# Patient Record
Sex: Male | Born: 1960 | Race: White | Hispanic: No | Smoking: Current some day smoker
Health system: Southern US, Community
[De-identification: ages and names within clinical notes are randomized; demographics above are authoritative.]

## PROBLEM LIST (undated history)

## (undated) DIAGNOSIS — I5189 Other ill-defined heart diseases: Secondary | ICD-10-CM

## (undated) DIAGNOSIS — I10 Essential (primary) hypertension: Secondary | ICD-10-CM

## (undated) DIAGNOSIS — E785 Hyperlipidemia, unspecified: Secondary | ICD-10-CM

## (undated) DIAGNOSIS — I214 Non-ST elevation (NSTEMI) myocardial infarction: Secondary | ICD-10-CM

## (undated) DIAGNOSIS — Z720 Tobacco use: Secondary | ICD-10-CM

## (undated) DIAGNOSIS — I2510 Atherosclerotic heart disease of native coronary artery without angina pectoris: Secondary | ICD-10-CM

## (undated) DIAGNOSIS — I200 Unstable angina: Secondary | ICD-10-CM

## (undated) DIAGNOSIS — I219 Acute myocardial infarction, unspecified: Secondary | ICD-10-CM

## (undated) HISTORY — PX: WRIST SURGERY: SHX841

## (undated) HISTORY — DX: Other ill-defined heart diseases: I51.89

## (undated) HISTORY — DX: Atherosclerotic heart disease of native coronary artery without angina pectoris: I25.10

## (undated) HISTORY — PX: CORONARY ANGIOPLASTY WITH STENT PLACEMENT: SHX49

---

## 2004-09-10 ENCOUNTER — Emergency Department: Payer: Self-pay | Admitting: Emergency Medicine

## 2005-03-15 ENCOUNTER — Emergency Department: Payer: Self-pay | Admitting: Emergency Medicine

## 2019-01-16 ENCOUNTER — Encounter: Payer: Self-pay | Admitting: Emergency Medicine

## 2019-01-16 ENCOUNTER — Other Ambulatory Visit: Payer: Self-pay

## 2019-01-16 ENCOUNTER — Emergency Department
Admission: EM | Admit: 2019-01-16 | Discharge: 2019-01-16 | Disposition: A | Discharge status: Home / Self Care | Payer: Self-pay | Attending: Emergency Medicine | Admitting: Emergency Medicine

## 2019-01-16 ENCOUNTER — Emergency Department: Payer: Self-pay

## 2019-01-16 DIAGNOSIS — M65312 Trigger thumb, left thumb: Secondary | ICD-10-CM

## 2019-01-16 DIAGNOSIS — R2232 Localized swelling, mass and lump, left upper limb: Secondary | ICD-10-CM | POA: Insufficient documentation

## 2019-01-16 NOTE — ED Notes (Signed)
Thumb spica splint applied to left thumb.

## 2019-01-16 NOTE — ED Provider Notes (Signed)
[facility] Emergency Department Provider Note  ____________________________________________   First MD Initiated Contact with Patient 01/16/19 1702     (approximate)  I have reviewed the triage vital signs and the nursing notes.   HISTORY  Chief Complaint Wrist Pain    HPI Jeffery Sweeney is a 58 y.o. male presents emergency department complaining of left thumb pain.  Symptoms for approximately 1 month.  Some swelling in the wrist.  He had surgery on the same wrist several years ago.  He states the thumb keeps popping and getting stuck in certain position.  He denies any numbness or tingling.    History reviewed. No pertinent past medical history.  There are no active problems to display for this patient.   History reviewed. No pertinent surgical history.  Prior to Admission medications   Not on File    Allergies Patient has no known allergies.  No family history on file.  Social History Social History   Tobacco Use  . Smoking status: Not on file  Substance Use Topics  . Alcohol use: Not on file  . Drug use: Not on file    Review of Systems  Constitutional: No fever/chills Eyes: No visual changes. ENT: No sore throat. Respiratory: Denies cough Genitourinary: Negative for dysuria. Musculoskeletal: Negative for back pain.  Positive left thumb pain Skin: Negative for rash.    ____________________________________________   PHYSICAL EXAM:  VITAL SIGNS: ED Triage Vitals  Enc Vitals Group     BP 01/16/19 1650 (!) 153/94     Pulse Rate 01/16/19 1650 (!) 113     Resp 01/16/19 1650 20     Temp 01/16/19 1650 98.5 F (36.9 C)     Temp Source 01/16/19 1650 Oral     SpO2 01/16/19 1650 96 %     Weight 01/16/19 1650 182 lb (82.6 kg)     Height 01/16/19 1650 5\' 3"  (1.6 m)     Head Circumference --      Peak Flow --      Pain Score 01/16/19 1656 6     Pain Loc --      Pain Edu? --      Excl. in GC? --     Constitutional:  Alert and oriented. Well appearing and in no acute distress. Eyes: Conjunctivae are normal.  Head: Atraumatic. Nose: No congestion/rhinnorhea. Mouth/Throat: Mucous membranes are moist.   Neck:  supple no lymphadenopathy noted Cardiovascular: Normal rate, regular rhythm.  Respiratory: Normal respiratory effort.  No retractions, GU: deferred Musculoskeletal: FROM all extremities, warm and well perfused, snuffbox of the left wrist along with the carpal bones are tender to palpation.  Neurovascular is intact.  No bruising or swelling is noted. Neurologic:  Normal speech and language.  Skin:  Skin is warm, dry and intact. No rash noted. Psychiatric: Mood and affect are normal. Speech and behavior are normal.  ____________________________________________   LABS (all labs ordered are listed, but only abnormal results are displayed)  Labs Reviewed - No data to display ____________________________________________   ____________________________________________  RADIOLOGY  X-ray of the left wrist is negative for any acute injury  ____________________________________________   PROCEDURES  Procedure(s) performed: Thumb spica splint applied by the nurse   Procedures    ____________________________________________   INITIAL IMPRESSION / ASSESSMENT AND PLAN / ED COURSE  Pertinent labs & imaging results that were available during my care of the patient were reviewed by me and considered in my medical decision making (see chart  for details).   Patient is 58 year old male presents emergency department complaining of left thumb pain and wrist pain.  Physical exam shows the right to be nontender, the left wrist is tender along the carpal bones on the radial aspect.  Medial exam is unremarkable  X-ray of the left wrist is negative for any acute abnormality.  Explained the results to the patient.  Told this could be a tenosynovitis or trigger finger.  He was placed in a thumb spica  splint.  He is [facility] and asked for an appointment with the hand specialist.  He states he understands will comply.  She is to take over-the-counter ibuprofen for pain as needed.  Is discharged stable condition.    Jeffery Sweeney was evaluated in Emergency Department on 01/16/2019 for the symptoms described in the history of present illness. He was evaluated in the context of the global COVID-19 pandemic, which necessitated consideration that the patient might be at risk for infection with the SARS-CoV-2 virus that causes COVID-19. Institutional protocols and algorithms that pertain to the evaluation of patients at risk for COVID-19 are in a state of rapid change based on information released by regulatory bodies including the CDC and federal and state organizations. These policies and algorithms were followed during the patient's care in the ED.   As part of my medical decision making, I reviewed the following data within the [place] notes reviewed and incorporated, Old chart reviewed, Radiograph reviewed x-ray left wrist negative, Notes from prior ED visits and [place] Controlled Substance Database  ____________________________________________   FINAL CLINICAL IMPRESSION(S) / ED DIAGNOSES  Final diagnoses:  Trigger thumb of left hand      NEW MEDICATIONS STARTED DURING THIS VISIT:  There are no discharge medications for this patient.    Note:  This document was prepared using Dragon voice recognition software and may include unintentional dictation errors.    Versie Starks, PA-C 01/16/19 [name], MD 01/16/19 [phone]

## 2019-01-16 NOTE — Discharge Instructions (Addendum)
[facility]up with [facility].  Please call for appointment.  Take over-the-counter Tylenol or Advil for inflammation.  Return emergency department for worsening.

## 2019-01-16 NOTE — ED Triage Notes (Signed)
Pt to ED via POV c/o wrist pain and swelling. Pt states that he injured his thumb about a month ago and about 2 weeks after he started having pain and swelling in his wrist. Pt states that he had surgery on same wrist several years ago. Pt is in NAD

## 2019-01-16 NOTE — ED Notes (Signed)
Pt verbalized understanding of discharge instructions. NAD at this time. 

## 2019-03-02 ENCOUNTER — Other Ambulatory Visit: Payer: Self-pay

## 2019-03-02 ENCOUNTER — Emergency Department
Admission: EM | Admit: 2019-03-02 | Discharge: 2019-03-02 | Disposition: A | Discharge status: Home / Self Care | Payer: Self-pay | Attending: Emergency Medicine | Admitting: Emergency Medicine

## 2019-03-02 ENCOUNTER — Encounter: Payer: Self-pay | Admitting: *Deleted

## 2019-03-02 ENCOUNTER — Emergency Department: Payer: Self-pay

## 2019-03-02 DIAGNOSIS — R112 Nausea with vomiting, unspecified: Secondary | ICD-10-CM | POA: Insufficient documentation

## 2019-03-02 DIAGNOSIS — F17210 Nicotine dependence, cigarettes, uncomplicated: Secondary | ICD-10-CM | POA: Insufficient documentation

## 2019-03-02 DIAGNOSIS — R1084 Generalized abdominal pain: Secondary | ICD-10-CM | POA: Insufficient documentation

## 2019-03-02 LAB — CBC
HCT: 44.6 % (ref 39.0–52.0)
Hemoglobin: 15.2 g/dL (ref 13.0–17.0)
MCH: 29.9 pg (ref 26.0–34.0)
MCHC: 34.1 g/dL (ref 30.0–36.0)
MCV: 87.6 fL (ref 80.0–100.0)
Platelets: 285 10*3/uL (ref 150–400)
RBC: 5.09 MIL/uL (ref 4.22–5.81)
RDW: 12.9 % (ref 11.5–15.5)
WBC: 7.1 10*3/uL (ref 4.0–10.5)
nRBC: 0 % (ref 0.0–0.2)

## 2019-03-02 LAB — URINALYSIS, COMPLETE (UACMP) WITH MICROSCOPIC
Bacteria, UA: NONE SEEN
Bilirubin Urine: NEGATIVE
Glucose, UA: NEGATIVE mg/dL
Hgb urine dipstick: NEGATIVE
Ketones, ur: NEGATIVE mg/dL
Leukocytes,Ua: NEGATIVE
Nitrite: NEGATIVE
Protein, ur: NEGATIVE mg/dL
Specific Gravity, Urine: 1.016 (ref 1.005–1.030)
pH: 5 (ref 5.0–8.0)

## 2019-03-02 LAB — TROPONIN I (HIGH SENSITIVITY): Troponin I (High Sensitivity): 3 ng/L (ref <18)

## 2019-03-02 LAB — COMPREHENSIVE METABOLIC PANEL
ALT: 19 U/L (ref 0–44)
AST: 18 U/L (ref 15–41)
Albumin: 3.7 g/dL (ref 3.5–5.0)
Alkaline Phosphatase: 77 U/L (ref 38–126)
Anion gap: 6 (ref 5–15)
BUN: 15 mg/dL (ref 6–20)
CO2: 23 mmol/L (ref 22–32)
Calcium: 9 mg/dL (ref 8.9–10.3)
Chloride: 106 mmol/L (ref 98–111)
Creatinine, Ser: 0.91 mg/dL (ref 0.61–1.24)
GFR calc Af Amer: >60 mL/min (ref >60)
GFR calc non Af Amer: >60 mL/min (ref >60)
Glucose, Bld: 111 mg/dL — ABNORMAL HIGH (ref 70–99)
Potassium: 3.7 mmol/L (ref 3.5–5.1)
Sodium: 135 mmol/L (ref 135–145)
Total Bilirubin: 0.8 mg/dL (ref 0.3–1.2)
Total Protein: 7.1 g/dL (ref 6.5–8.1)

## 2019-03-02 LAB — LIPASE, BLOOD: Lipase: 26 U/L (ref 11–51)

## 2019-03-02 MED ORDER — ONDANSETRON HCL 4 MG/2ML IJ SOLN
4.0000 mg | Freq: Once | INTRAMUSCULAR | Status: AC
  Administered 2019-03-02: 4 mg via INTRAVENOUS
  Filled 2019-03-02: qty 2

## 2019-03-02 MED ORDER — ONDANSETRON HCL 4 MG/2ML IJ SOLN
4.0000 mg | Freq: Once | INTRAMUSCULAR | Status: AC
  Administered 2019-03-02: 21:00:00 4 mg via INTRAVENOUS
  Filled 2019-03-02: qty 2

## 2019-03-02 MED ORDER — IOHEXOL 9 MG/ML PO SOLN
500.0000 mL | Freq: Once | ORAL | Status: DC | PRN
  Administered 2019-03-02: 500 mL via ORAL
  Filled 2019-03-02: qty 500

## 2019-03-02 MED ORDER — ONDANSETRON 4 MG PO TBDP: 4.0000 mg | ORAL_TABLET | Freq: Three times a day (TID) | ORAL | 0 refills | Status: DC | PRN

## 2019-03-02 MED ORDER — SODIUM CHLORIDE 0.9 % IV BOLUS
1000.0000 mL | Freq: Once | INTRAVENOUS | Status: AC
  Administered 2019-03-02: 21:00:00 1000 mL via INTRAVENOUS

## 2019-03-02 MED ORDER — SODIUM CHLORIDE 0.9% FLUSH: 3.0000 mL | Freq: Once | INTRAVENOUS | Status: DC

## 2019-03-02 MED ORDER — IOHEXOL 300 MG/ML  SOLN
100.0000 mL | Freq: Once | INTRAMUSCULAR | Status: AC | PRN
  Administered 2019-03-02: 100 mL via INTRAVENOUS

## 2019-03-02 NOTE — ED Notes (Signed)
Pt notified this nurse that he finished his contrast, CT notified.

## 2019-03-02 NOTE — ED Notes (Signed)
Pt returned to room 02 from CT.

## 2019-03-02 NOTE — ED Notes (Signed)
Patient transported to CT 

## 2019-03-02 NOTE — ED Notes (Signed)
Pt states that he was woken up out of his sleep around 0130 this morning with nausea and vomiting. States that he ate a [employer] salad last night. States that he attempted to eat crackers but found no relief.

## 2019-03-02 NOTE — Discharge Instructions (Addendum)
Use the Zofran melt on your tongue wafers 1 wafer 3 times a day to help with the nausea.  Please return for fever continued vomiting or continued abdominal pain.  Please follow-up with your regular doctor in the next couple days.

## 2019-03-02 NOTE — ED Notes (Signed)
Pt given warm blankets.

## 2019-03-02 NOTE — ED Provider Notes (Signed)
[facility] Emergency Department Provider Note   ____________________________________________   First MD Initiated Contact with Patient 03/02/19 2033     (approximate)  I have reviewed the triage vital signs and the nursing notes.   HISTORY  Chief Complaint Abdominal Pain  HPI QUY LOTTS is a 58 y.o. male who reports abdominal pain with vomiting starting early this morning.  He said it started after he ate at [facility] salad last night.  He was tried to eat some crackers but that did not help.  He has not had any diarrhea.  Has diffusely tender abdomen.  He says it hurts.  Not particularly crampy.  Nothing seems to make it better or worse.         History reviewed. No pertinent past medical history. Past history significant for hypertension and coronary artery disease with 3 stents. There are no active problems to display for this patient.   No past surgical history on file.  Prior to Admission medications   Not on File    Allergies Patient has no known allergies.  No family history on file.  Social History Social History   Tobacco Use   Smoking status: Current Every Day Smoker   Smokeless tobacco: Never Used  Substance Use Topics   Alcohol use: Not Currently   Drug use: Not Currently    Review of Systems  Constitutional: No fever/chills Eyes: No visual changes. ENT: No sore throat. Cardiovascular: Denies chest pain. Respiratory: Denies shortness of breath. Gastrointestinal:  abdominal pain.   nausea,  vomiting.  No diarrhea.  No constipation. Genitourinary: Negative for dysuria. Musculoskeletal: Negative for back pain. Skin: Negative for rash. Neurological: Negative for headaches, focal weakness   ____________________________________________   PHYSICAL EXAM:  VITAL SIGNS: ED Triage Vitals  Enc Vitals Group     BP 03/02/19 1907 (!) 140/96     Pulse Rate 03/02/19 1907 100     Resp --      Temp 03/02/19 1907  98.5 F (36.9 C)     Temp src --      SpO2 03/02/19 1907 97 %     Weight 03/02/19 1907 180 lb (81.6 kg)     Height 03/02/19 1907 5' 3.5" (1.613 m)     Head Circumference --      Peak Flow --      Pain Score 03/02/19 1910 9     Pain Loc --      Pain Edu? --      Excl. in GC? --     Constitutional: Alert and oriented. Well appearing and in no acute distress. Eyes: Conjunctivae are normal. PERRL. EOMI. Head: Atraumatic. Nose: No congestion/rhinnorhea. Mouth/Throat: Mucous membranes are moist.  Oropharynx non-erythematous. Neck: No stridor.  Cardiovascular: Normal rate, regular rhythm. Grossly normal heart sounds.  Good peripheral circulation. Respiratory: Normal respiratory effort.  No retractions. Lungs CTAB. Gastrointestinal: Soft mildly diffusely tender.  Abdomen is quiet, slight distention. No abdominal bruits.  Musculoskeletal: No lower extremity tenderness nor edema.   Neurologic:  Normal speech and language. No gross focal neurologic deficits are appreciated. Skin:  Skin is warm, dry and intact. No rash noted.  ____________________________________________   LABS (all labs ordered are listed, but only abnormal results are displayed)  Labs Reviewed  COMPREHENSIVE METABOLIC PANEL - Abnormal; Notable for the following components:      Result Value   Glucose, Bld 111 (*)    All other components within normal limits  URINALYSIS, COMPLETE (UACMP) WITH  MICROSCOPIC - Abnormal; Notable for the following components:   Color, Urine YELLOW (*)    APPearance CLEAR (*)    All other components within normal limits  LIPASE, BLOOD  CBC  TROPONIN I (HIGH SENSITIVITY)  TROPONIN I (HIGH SENSITIVITY)   ____________________________________________  EKG  EKG read interpreted by me shows sinus tachycardia rate of 103 normal axis increased R wave progression no acute ST-T changes ____________________________________________  RADIOLOGY  ED MD interpretation: CT read by radiology  reviewed by me shows no acute pathology. Chest x-ray read by radiology reviewed by me also shows no acute pathology Official radiology report(s): Ct Abdomen Pelvis W Contrast  Result Date: 03/02/2019 CLINICAL DATA:  58 year old male with abdominal pain. EXAM: CT ABDOMEN AND PELVIS WITH CONTRAST TECHNIQUE: Multidetector CT imaging of the abdomen and pelvis was performed using the standard protocol following bolus administration of intravenous contrast. CONTRAST:  13mL OMNIPAQUE IOHEXOL 300 MG/ML  SOLN COMPARISON:  None. FINDINGS: Lower chest: The visualized lung bases are clear. No intra-abdominal free air or free fluid. Hepatobiliary: Apparent fatty infiltration of the liver. No intrahepatic biliary ductal dilatation. The gallbladder is predominantly contracted. No calcified gallstone or pericholecystic fluid Pancreas: Unremarkable. No pancreatic ductal dilatation or surrounding inflammatory changes. Spleen: Normal in size without focal abnormality. Adrenals/Urinary Tract: The adrenal glands are unremarkable. There is no hydronephrosis on either side. There is symmetric enhancement and excretion of contrast by both kidneys. There is a 3.5 cm left renal interpolar cyst. The visualized ureters and urinary bladder appear unremarkable. Stomach/Bowel: There is sigmoid diverticulosis and scattered colonic diverticula without active inflammatory changes. There is no bowel obstruction or active inflammation. The appendix is normal. Vascular/Lymphatic: Moderate aortoiliac atherosclerotic disease. The IVC is unremarkable. No portal venous gas. There is no adenopathy. Reproductive: The prostate and seminal vesicles are grossly unremarkable. No pelvic mass. Other: None Musculoskeletal: Degenerative changes of the spine. No acute osseous pathology. IMPRESSION: 1. No acute intra-abdominal or pelvic pathology. 2. Colonic diverticulosis. No bowel obstruction or active inflammation. Normal appendix. Aortic Atherosclerosis  (ICD10-I70.0). Electronically Signed   By: Anner Crete M.D.   On: 03/02/2019 21:38   Dg Chest Portable 1 View  Result Date: 03/02/2019 CLINICAL DATA:  Diffuse abdominal pain.  Nausea vomiting. EXAM: PORTABLE CHEST 1 VIEW COMPARISON:  None. FINDINGS: The heart size is normal. Lung volumes are low. There is no edema or effusion. No focal airspace disease is present. The visualized soft tissues and bony thorax are unremarkable. IMPRESSION: Negative one-view chest x-ray Electronically Signed   By: San Morelle M.D.   On: 03/02/2019 21:17    ____________________________________________   PROCEDURES  Procedure(s) performed (including Critical Care):  Procedures   ____________________________________________   INITIAL IMPRESSION / ASSESSMENT AND PLAN / ED COURSE  Patient lab work looks normal chest x-ray and CT look okay.  Patient had had a lot of vomiting.  It is possible his abdominal pain is only from the vomiting.  I will give him some Zofran and some pain medicine and let him go.             ____________________________________________   FINAL CLINICAL IMPRESSION(S) / ED DIAGNOSES  Final diagnoses:  Generalized abdominal pain  Non-intractable vomiting with nausea, unspecified vomiting type     ED Discharge Orders    None       Note:  This document was prepared using Dragon voice recognition software and may include unintentional dictation errors.    Nena Polio, MD 03/02/19 2317

## 2019-03-02 NOTE — ED Triage Notes (Signed)
Pt reports abd pain with vomiting today.  No diarrhea. Vomiting x 6.   No chest pain.  Pt alert.  Speech clear.

## 2019-04-18 ENCOUNTER — Encounter: Payer: Self-pay | Admitting: Emergency Medicine

## 2019-04-18 ENCOUNTER — Other Ambulatory Visit: Payer: Self-pay

## 2019-04-18 ENCOUNTER — Emergency Department
Admission: EM | Admit: 2019-04-18 | Discharge: 2019-04-18 | Disposition: A | Discharge status: Home / Self Care | Payer: Self-pay | Attending: Student in an Organized Health Care Education/Training Program | Admitting: Student in an Organized Health Care Education/Training Program

## 2019-04-18 DIAGNOSIS — R197 Diarrhea, unspecified: Secondary | ICD-10-CM | POA: Insufficient documentation

## 2019-04-18 DIAGNOSIS — Z20828 Contact with and (suspected) exposure to other viral communicable diseases: Secondary | ICD-10-CM | POA: Insufficient documentation

## 2019-04-18 DIAGNOSIS — Z955 Presence of coronary angioplasty implant and graft: Secondary | ICD-10-CM | POA: Insufficient documentation

## 2019-04-18 DIAGNOSIS — I252 Old myocardial infarction: Secondary | ICD-10-CM | POA: Insufficient documentation

## 2019-04-18 DIAGNOSIS — F17210 Nicotine dependence, cigarettes, uncomplicated: Secondary | ICD-10-CM | POA: Insufficient documentation

## 2019-04-18 DIAGNOSIS — I10 Essential (primary) hypertension: Secondary | ICD-10-CM | POA: Insufficient documentation

## 2019-04-18 HISTORY — DX: Acute myocardial infarction, unspecified: I21.9

## 2019-04-18 HISTORY — DX: Essential (primary) hypertension: I10

## 2019-04-18 LAB — COMPREHENSIVE METABOLIC PANEL
ALT: 25 U/L (ref 0–44)
AST: 23 U/L (ref 15–41)
Albumin: 4.1 g/dL (ref 3.5–5.0)
Alkaline Phosphatase: 81 U/L (ref 38–126)
Anion gap: 10 (ref 5–15)
BUN: 18 mg/dL (ref 6–20)
CO2: 23 mmol/L (ref 22–32)
Calcium: 9 mg/dL (ref 8.9–10.3)
Chloride: 104 mmol/L (ref 98–111)
Creatinine, Ser: 0.91 mg/dL (ref 0.61–1.24)
GFR calc Af Amer: >60 mL/min (ref >60)
GFR calc non Af Amer: >60 mL/min (ref >60)
Glucose, Bld: 121 mg/dL — ABNORMAL HIGH (ref 70–99)
Potassium: 4.1 mmol/L (ref 3.5–5.1)
Sodium: 137 mmol/L (ref 135–145)
Total Bilirubin: 1.1 mg/dL (ref 0.3–1.2)
Total Protein: 7.8 g/dL (ref 6.5–8.1)

## 2019-04-18 LAB — CBC
HCT: 46.8 % (ref 39.0–52.0)
Hemoglobin: 15.7 g/dL (ref 13.0–17.0)
MCH: 29.3 pg (ref 26.0–34.0)
MCHC: 33.5 g/dL (ref 30.0–36.0)
MCV: 87.5 fL (ref 80.0–100.0)
Platelets: 296 10*3/uL (ref 150–400)
RBC: 5.35 MIL/uL (ref 4.22–5.81)
RDW: 12.7 % (ref 11.5–15.5)
WBC: 9.7 10*3/uL (ref 4.0–10.5)
nRBC: 0 % (ref 0.0–0.2)

## 2019-04-18 LAB — LIPASE, BLOOD: Lipase: 24 U/L (ref 11–51)

## 2019-04-18 MED ORDER — ONDANSETRON HCL 4 MG PO TABS: 4.0000 mg | ORAL_TABLET | Freq: Every day | ORAL | 0 refills | Status: DC | PRN

## 2019-04-18 MED ORDER — ONDANSETRON 4 MG PO TBDP
4.0000 mg | ORAL_TABLET | Freq: Once | ORAL | Status: AC
  Administered 2019-04-18: 4 mg via ORAL
  Filled 2019-04-18: qty 1

## 2019-04-18 MED ORDER — SODIUM CHLORIDE 0.9% FLUSH: 3.0000 mL | Freq: Once | INTRAVENOUS | Status: DC

## 2019-04-18 MED ORDER — SODIUM CHLORIDE 0.9 % IV BOLUS
1000.0000 mL | Freq: Once | INTRAVENOUS | Status: AC
  Administered 2019-04-18: 15:00:00 1000 mL via INTRAVENOUS

## 2019-04-18 NOTE — ED Triage Notes (Signed)
Says he has diarrhea since Saturday--had some vomiting and headache, but those are better.  Says the diarrhea continues.  He is able to drink and keep liquids downb.

## 2019-04-18 NOTE — ED Provider Notes (Signed)
[facility] Emergency Department Provider Note    First MD Initiated Contact with Patient 04/18/19 1457     (approximate)  I have reviewed the triage vital signs and the nursing notes.   HISTORY  Chief Complaint Diarrhea    HPI Jeffery Sweeney is a 58 y.o. male who presents to the ER for evaluation of several days of watery diarrhea.  States that he feels like he got sick after eating a [employer] hamburger.  No blood.  No melena.  No recent antibiotics.  Still have some intermittent crampy abdominal pain.  Feels nauseated but not having any vomiting.  Denies any chest pain or shortness of breath.  No known sick contacts but does work at [employer].    Past Medical History:  Diagnosis Date  . Hypertension   . Myocardial infarct [facility])    No family history on file. Past Surgical History:  Procedure Laterality Date  . CORONARY ANGIOPLASTY WITH STENT PLACEMENT    . WRIST SURGERY Left    for cyst   There are no active problems to display for this patient.     Prior to Admission medications   Medication Sig Start Date End Date Taking? Authorizing Provider  ondansetron (ZOFRAN ODT) 4 MG disintegrating tablet Take 1 tablet (4 mg total) by mouth every 8 (eight) hours as needed for nausea or vomiting. 03/02/19   Arnaldo Natal, MD  ondansetron (ZOFRAN) 4 MG tablet Take 1 tablet (4 mg total) by mouth daily as needed. 04/18/19 04/17/20  Willy Eddy, MD    Allergies Patient has no known allergies.    Social History Social History   Tobacco Use  . Smoking status: Current Every Day Smoker  . Smokeless tobacco: Never Used  Substance Use Topics  . Alcohol use: Not Currently  . Drug use: Not Currently    Review of Systems Patient denies headaches, rhinorrhea, blurry vision, numbness, shortness of breath, chest pain, edema, cough, abdominal pain, nausea, vomiting, diarrhea, dysuria, fevers, rashes or hallucinations unless otherwise stated above in  HPI. ____________________________________________   PHYSICAL EXAM:  VITAL SIGNS: Vitals:   04/18/19 1124 04/18/19 1507  BP: (!) 136/99 129/86  Pulse: (!) 112 84  Resp: 16 18  Temp: 98 F (36.7 C)   SpO2: 98% 99%    Constitutional: Alert and oriented.  Eyes: Conjunctivae are normal.  Head: Atraumatic. Nose: No congestion/rhinnorhea. Mouth/Throat: Mucous membranes are moist.   Neck: No stridor. Painless ROM.  Cardiovascular: Normal rate, regular rhythm. Grossly normal heart sounds.  Good peripheral circulation. Respiratory: Normal respiratory effort.  No retractions. Lungs CTAB. Gastrointestinal: Soft and nontender. No distention. No abdominal bruits. No CVA tenderness. Genitourinary:  Musculoskeletal: No lower extremity tenderness nor edema.  No joint effusions. Neurologic:  Normal speech and language. No gross focal neurologic deficits are appreciated. No facial droop Skin:  Skin is warm, dry and intact. No rash noted. Psychiatric: Mood and affect are normal. Speech and behavior are normal.  ____________________________________________   LABS (all labs ordered are listed, but only abnormal results are displayed)  Results for orders placed or performed during the hospital encounter of 04/18/19 (from the past 24 hour(s))  Lipase, blood     Status: None   Collection Time: 04/18/19 11:31 AM  Result Value Ref Range   Lipase 24 11 - 51 U/L  Comprehensive metabolic panel     Status: Abnormal   Collection Time: 04/18/19 11:31 AM  Result Value Ref Range   Sodium 137 135 -  145 mmol/L   Potassium 4.1 3.5 - 5.1 mmol/L   Chloride 104 98 - 111 mmol/L   CO2 23 22 - 32 mmol/L   Glucose, Bld 121 (H) 70 - 99 mg/dL   BUN 18 6 - 20 mg/dL   Creatinine, Ser 0.91 0.61 - 1.24 mg/dL   Calcium 9.0 8.9 - 10.3 mg/dL   Total Protein 7.8 6.5 - 8.1 g/dL   Albumin 4.1 3.5 - 5.0 g/dL   AST 23 15 - 41 U/L   ALT 25 0 - 44 U/L   Alkaline Phosphatase 81 38 - 126 U/L   Total Bilirubin 1.1 0.3 -  1.2 mg/dL   GFR calc non Af Amer >60 >60 mL/min   GFR calc Af Amer >60 >60 mL/min   Anion gap 10 5 - 15  CBC     Status: None   Collection Time: 04/18/19 11:31 AM  Result Value Ref Range   WBC 9.7 4.0 - 10.5 K/uL   RBC 5.35 4.22 - 5.81 MIL/uL   Hemoglobin 15.7 13.0 - 17.0 g/dL   HCT 46.8 39.0 - 52.0 %   MCV 87.5 80.0 - 100.0 fL   MCH 29.3 26.0 - 34.0 pg   MCHC 33.5 30.0 - 36.0 g/dL   RDW 12.7 11.5 - 15.5 %   Platelets 296 150 - 400 K/uL   nRBC 0.0 0.0 - 0.2 %   ____________________________________________ ____________________________________________  RADIOLOGY   ____________________________________________   PROCEDURES  Procedure(s) performed:  Procedures    Critical Care performed: no ____________________________________________   INITIAL IMPRESSION / ASSESSMENT AND PLAN / ED COURSE  Pertinent labs & imaging results that were available during my care of the patient were reviewed by me and considered in my medical decision making (see chart for details).   DDX: Enteritis, colitis, C. difficile, Covid, dehydration, electrolyte abnormality  Jeffery Sweeney is a 58 y.o. who presents to the ED with symptoms as described above.  Patient nontoxic-appearing in no acute distress.  Appears well perfused.  Mild tachycardia upon arrival.  Will give IV fluids check blood work and reassess.  His abdominal exam is soft benign.  Clinical Course as of Apr 18 1639  Mon Apr 18, 2019  1635 Patient able tolerate p.o.  Unable to provide stool sample as he does not feel like he needs to.  Have a lower suspicion for C. difficile.  At this point he believe he stable and appropriate for outpatient follow-up.  Discussed signs and symptoms for which he should return to the ER.   [PR]    Clinical Course User Index [PR] Merlyn Lot, MD    The patient was evaluated in Emergency Department today for the symptoms described in the history of present illness. He/she was evaluated in the  context of the global COVID-19 pandemic, which necessitated consideration that the patient might be at risk for infection with the SARS-CoV-2 virus that causes COVID-19. Institutional protocols and algorithms that pertain to the evaluation of patients at risk for COVID-19 are in a state of rapid change based on information released by regulatory bodies including the CDC and federal and state organizations. These policies and algorithms were followed during the patient's care in the ED.  As part of my medical decision making, I reviewed the following data within the [place] notes reviewed and incorporated, Labs reviewed, notes from prior ED visits and [place] Controlled Substance Database   ____________________________________________   FINAL CLINICAL IMPRESSION(S) / ED DIAGNOSES  Final diagnoses:  Diarrhea, unspecified type      NEW MEDICATIONS STARTED DURING THIS VISIT:  New Prescriptions   ONDANSETRON (ZOFRAN) 4 MG TABLET    Take 1 tablet (4 mg total) by mouth daily as needed.     Note:  This document was prepared using Dragon voice recognition software and may include unintentional dictation errors.    Willy Eddyobinson, [place], MD 04/18/19 1640

## 2019-04-18 NOTE — ED Notes (Signed)
Pt given ginger ale and graham crackers.

## 2019-04-19 LAB — SARS CORONAVIRUS 2 (TAT 6-24 HRS): SARS Coronavirus 2: NEGATIVE

## 2019-08-25 ENCOUNTER — Other Ambulatory Visit: Payer: Self-pay

## 2019-08-25 DIAGNOSIS — G8929 Other chronic pain: Secondary | ICD-10-CM | POA: Insufficient documentation

## 2019-08-25 DIAGNOSIS — M5442 Lumbago with sciatica, left side: Secondary | ICD-10-CM | POA: Insufficient documentation

## 2019-08-25 DIAGNOSIS — M545 Low back pain: Secondary | ICD-10-CM | POA: Insufficient documentation

## 2019-08-25 DIAGNOSIS — I252 Old myocardial infarction: Secondary | ICD-10-CM | POA: Insufficient documentation

## 2019-08-25 DIAGNOSIS — M79662 Pain in left lower leg: Secondary | ICD-10-CM | POA: Insufficient documentation

## 2019-08-25 DIAGNOSIS — F17200 Nicotine dependence, unspecified, uncomplicated: Secondary | ICD-10-CM | POA: Insufficient documentation

## 2019-08-25 DIAGNOSIS — I10 Essential (primary) hypertension: Secondary | ICD-10-CM | POA: Insufficient documentation

## 2019-08-25 DIAGNOSIS — Z955 Presence of coronary angioplasty implant and graft: Secondary | ICD-10-CM | POA: Insufficient documentation

## 2019-08-25 NOTE — ED Triage Notes (Addendum)
Pt in with co left lower back pain radiates down left hip and down to left lower leg. Pt states worse to walk or move. Pt has hx of back problems but unsure of diagnosis. Pt states also pain to left calf that is severe at times.

## 2019-08-26 ENCOUNTER — Emergency Department
Admission: EM | Admit: 2019-08-26 | Discharge: 2019-08-26 | Disposition: A | Discharge status: Home / Self Care | Payer: Self-pay | Attending: Emergency Medicine | Admitting: Emergency Medicine

## 2019-08-26 ENCOUNTER — Encounter: Payer: Self-pay | Admitting: Emergency Medicine

## 2019-08-26 ENCOUNTER — Emergency Department: Payer: Self-pay

## 2019-08-26 DIAGNOSIS — M5442 Lumbago with sciatica, left side: Secondary | ICD-10-CM

## 2019-08-26 DIAGNOSIS — G8929 Other chronic pain: Secondary | ICD-10-CM

## 2019-08-26 MED ORDER — TRAMADOL HCL 50 MG PO TABS: 50.0000 mg | ORAL_TABLET | Freq: Four times a day (QID) | ORAL | 0 refills | Status: DC | PRN

## 2019-08-26 MED ORDER — PREDNISONE 10 MG PO TABS: ORAL_TABLET | ORAL | 0 refills | Status: DC

## 2019-08-26 MED ORDER — KETOROLAC TROMETHAMINE 30 MG/ML IJ SOLN
30.0000 mg | Freq: Once | INTRAMUSCULAR | Status: AC
  Administered 2019-08-26: 30 mg via INTRAMUSCULAR
  Filled 2019-08-26: qty 1

## 2019-08-26 NOTE — ED Provider Notes (Signed)
[facility] Emergency Department Provider Note   ____________________________________________   First MD Initiated Contact with Patient 08/26/19 [phone]     (approximate)  I have reviewed the triage vital signs and the nursing notes.   HISTORY  Chief Complaint Back Pain   HPI Jeffery Sweeney is a 59 y.o. male presents to the ED with complaint of low back pain.  Patient states that he has chronic back pain and had an injury 3 to 4 years ago in which he fell and fractured L5.  He states that this pain is different and that it goes from his lower back over and into his left hip and radiates down his left lower leg.  He states he also has pain in his left calf which is severe at times.  He states that he works 2 jobs and worked putting pressure on his right leg most of the time.  He was not able to go to his second job because of increased pain.  He denies any urinary symptoms, incontinence of bowel or bladder.  Patient continues to ambulate without any assistance.  He rates his pain as a 10/10.        Past Medical History:  Diagnosis Date  . Hypertension   . Myocardial infarct (HCC)     There are no problems to display for this patient.   Past Surgical History:  Procedure Laterality Date  . CORONARY ANGIOPLASTY WITH STENT PLACEMENT    . WRIST SURGERY Left    for cyst    Prior to Admission medications   Medication Sig Start Date End Date Taking? Authorizing Provider  predniSONE (DELTASONE) 10 MG tablet Take 6 tablets  today, on day 2 take 5 tablets, day 3 take 4 tablets, day 4 take 3 tablets, day 5 take  2 tablets and 1 tablet the last day 08/26/19   Tommi Rumps, PA-C  traMADol (ULTRAM) 50 MG tablet Take 1 tablet (50 mg total) by mouth every 6 (six) hours as needed. 08/26/19   Tommi Rumps, PA-C    Allergies Patient has no known allergies.  History reviewed. No pertinent family history.  Social History Social History   Tobacco Use  .  Smoking status: Current Every Day Smoker  . Smokeless tobacco: Never Used  Substance Use Topics  . Alcohol use: Not Currently  . Drug use: Not Currently    Review of Systems Constitutional: No fever/chills Cardiovascular: Denies chest pain. Respiratory: Denies shortness of breath. Gastrointestinal: No abdominal pain.  No nausea, no vomiting.  No diarrhea.  No constipation. Genitourinary: Negative for dysuria. Musculoskeletal: Positive for acute and chronic low back pain. Skin: Negative for rash. Neurological: Negative for headaches, focal weakness or numbness. ____________________________________________   PHYSICAL EXAM:  VITAL SIGNS: ED Triage Vitals [08/25/19 2340]  Enc Vitals Group     BP (!) 132/96     Pulse Rate (!) 104     Resp 20     Temp 97.7 F (36.5 C)     Temp Source Oral     SpO2 98 %     Weight 180 lb (81.6 kg)     Height 5\' 3"  (1.6 m)     Head Circumference      Peak Flow      Pain Score 10     Pain Loc      Pain Edu?      Excl. in GC?     Constitutional: Alert and oriented. Well appearing and  in no acute distress. Eyes: Conjunctivae are normal. PERRL. EOMI. Head: Atraumatic. Neck: No stridor.   Cardiovascular: Normal rate, regular rhythm. Grossly normal heart sounds.  Good peripheral circulation. Respiratory: Normal respiratory effort.  No retractions. Lungs CTAB. Gastrointestinal: Soft and nontender. No distention. No abdominal bruits. No CVA tenderness. Musculoskeletal: No pain on palpation of the thoracic spine.  There is however pain L5-S1 and sacral area to palpation.  Range of motion is decreased secondary to discomfort.  Good muscle strength bilaterally. Neurologic:  Normal speech and language.  Reflexes are 2+ bilaterally.  No gross focal neurologic deficits are appreciated.  Skin:  Skin is warm, dry and intact. No rash noted. Psychiatric: Mood and affect are normal. Speech and behavior are  normal.  ____________________________________________   LABS (all labs ordered are listed, but only abnormal results are displayed)  Labs Reviewed - No data to display  RADIOLOGY   Official radiology report(s): DG Lumbar Spine 2-3 Views  Result Date: 08/26/2019 CLINICAL DATA:  Back pain radiating into the left leg EXAM: LUMBAR SPINE - 2-3 VIEW COMPARISON:  March 02, 2019 FINDINGS: No acute fracture or malalignment is seen. There is a chronic compression deformity of the L5 vertebral body with approximately 25% loss in vertebral body height. Small anterior osteophytes seen in the lower thoracic spine. Slight anterior wedge compression deformities of the T11 through L1 vertebral bodies is seen with less than 25% loss in anterior vertebral body height. Facet arthropathy is most notable at L4-L5 and L5-S1. scattered vascular calcifications are noted IMPRESSION: No acute fracture or malalignment. Chronic compression deformity of the L5 vertebral body with approximately 25% loss in height. Electronically Signed   By: Prudencio Pair M.D.   On: 08/26/2019 08:02   US Venous Img Lower Unilateral Left  Result Date: 08/26/2019 CLINICAL DATA:  Calf pain EXAM: LEFT LOWER EXTREMITY VENOUS DOPPLER ULTRASOUND TECHNIQUE: Gray-scale sonography with graded compression, as well as color Doppler and duplex ultrasound were performed to evaluate the lower extremity deep venous systems from the level of the common femoral vein and including the common femoral, femoral, profunda femoral, popliteal and calf veins including the posterior tibial, peroneal and gastrocnemius veins when visible. The superficial great saphenous vein was also interrogated. Spectral Doppler was utilized to evaluate flow at rest and with distal augmentation maneuvers in the common femoral, femoral and popliteal veins. COMPARISON:  None. FINDINGS: Contralateral Common Femoral Vein: Respiratory phasicity is normal and symmetric with the symptomatic  side. No evidence of thrombus. Normal compressibility. Common Femoral Vein: No evidence of thrombus. Normal compressibility, respiratory phasicity and response to augmentation. Saphenofemoral Junction: No evidence of thrombus. Normal compressibility and flow on color Doppler imaging. Profunda Femoral Vein: No evidence of thrombus. Normal compressibility and flow on color Doppler imaging. Femoral Vein: No evidence of thrombus. Normal compressibility, respiratory phasicity and response to augmentation. Popliteal Vein: No evidence of thrombus. Normal compressibility, respiratory phasicity and response to augmentation. Calf Veins: No evidence of thrombus. Normal compressibility and flow on color Doppler imaging. Superficial Great Saphenous Vein: No evidence of thrombus. Normal compressibility. Other Findings:  None. IMPRESSION: No evidence of deep venous thrombosis. Electronically Signed   By: Constance Holster M.D.   On: 08/26/2019 00:36    ____________________________________________   PROCEDURES  Procedure(s) performed (including Critical Care):  Procedures   ____________________________________________   INITIAL IMPRESSION / ASSESSMENT AND PLAN / ED COURSE  As part of my medical decision making, I reviewed the following data within the electronic MEDICAL RECORD NUMBER Notes  from prior ED visits and [place] Controlled Substance Database  59 year old male presents to the ED with complaint of low back pain which now radiates into his left hip and down his left lower extremity.  Patient states he also has pain in his calf at times that is severe.  Patient reports that he did have a back injury 3 to 4 years ago in which he fell and fractured L5.  He denies any urinary symptoms or incontinence of bowel or bladder.  Patient also works 2 jobs and while he was able to work his first job was unable to work the second job as patient's pain had increased.  X-ray is negative for any acute changes but does confirm a  chronic compression fracture L5 with approximately 25% height loss.  There is some degenerative changes also noted on multiple levels.  Patient was made aware.  He was discharged with a prescription for prednisone 60 mg 6-day taper and tramadol as needed for pain.  Patient is aware that he cannot take the tramadol if he is working or driving.  He also is encouraged to follow-up with Dr. Martha Clan who is the orthopedist today if continued problems.  Return to the emergency department if any severe worsening of his symptoms.  ____________________________________________   FINAL CLINICAL IMPRESSION(S) / ED DIAGNOSES  Final diagnoses:  Acute exacerbation of chronic low back pain  Acute left-sided low back pain with left-sided sciatica     ED Discharge Orders         Ordered    predniSONE (DELTASONE) 10 MG tablet     08/26/19 0818    traMADol (ULTRAM) 50 MG tablet  Every 6 hours PRN     08/26/19 0818           Note:  This document was prepared using Dragon voice recognition software and may include unintentional dictation errors.    Tommi Rumps, PA-C 08/26/19 1120    Sharyn Creamer, MD 08/26/19 1529

## 2019-08-26 NOTE — ED Notes (Signed)
See triage note  Presents with lower back pain  States this is a chronic problem  Pain is moving into left hip and leg

## 2019-08-26 NOTE — Discharge Instructions (Signed)
Follow-up with Dr. Martha Clan.  You will need to call his office to make appointment for any continued low back pain.  Use ice or heat to your back as needed for discomfort.  Begin taking prednisone today and taper down by 1 tablet each day until completely finished.  Tramadol if needed for pain.  Do not drive or operate machinery while taking the tramadol as it could cause drowsiness.

## 2019-09-15 ENCOUNTER — Other Ambulatory Visit: Payer: Self-pay

## 2019-09-15 ENCOUNTER — Emergency Department
Admission: EM | Admit: 2019-09-15 | Discharge: 2019-09-15 | Disposition: A | Discharge status: Home / Self Care | Payer: Self-pay | Attending: Emergency Medicine | Admitting: Emergency Medicine

## 2019-09-15 DIAGNOSIS — I252 Old myocardial infarction: Secondary | ICD-10-CM | POA: Insufficient documentation

## 2019-09-15 DIAGNOSIS — I10 Essential (primary) hypertension: Secondary | ICD-10-CM | POA: Insufficient documentation

## 2019-09-15 DIAGNOSIS — F17200 Nicotine dependence, unspecified, uncomplicated: Secondary | ICD-10-CM | POA: Insufficient documentation

## 2019-09-15 DIAGNOSIS — Z79899 Other long term (current) drug therapy: Secondary | ICD-10-CM | POA: Insufficient documentation

## 2019-09-15 DIAGNOSIS — Z955 Presence of coronary angioplasty implant and graft: Secondary | ICD-10-CM | POA: Insufficient documentation

## 2019-09-15 DIAGNOSIS — M5442 Lumbago with sciatica, left side: Secondary | ICD-10-CM | POA: Insufficient documentation

## 2019-09-15 MED ORDER — KETOROLAC TROMETHAMINE 10 MG PO TABS: 10.0000 mg | ORAL_TABLET | Freq: Four times a day (QID) | ORAL | 0 refills | Status: DC | PRN

## 2019-09-15 MED ORDER — KETOROLAC TROMETHAMINE 30 MG/ML IJ SOLN
30.0000 mg | Freq: Once | INTRAMUSCULAR | Status: AC
  Administered 2019-09-15: 11:00:00 30 mg via INTRAMUSCULAR
  Filled 2019-09-15: qty 1

## 2019-09-15 MED ORDER — TRAMADOL HCL 50 MG PO TABS: 50.0000 mg | ORAL_TABLET | Freq: Four times a day (QID) | ORAL | 0 refills | Status: DC | PRN

## 2019-09-15 NOTE — ED Provider Notes (Signed)
[facility] Emergency Department Provider Note ____________________________________________  Time seen: Approximately 2:42 PM  I have reviewed the triage vital signs and the nursing notes.  HISTORY  Chief Complaint Back Pain   HPI Jeffery Sweeney is a 58 y.o. male who presents to the emergency department for treatment and evaluation of acute on chronic back pain that got worse while at work today. He started having pain while at his first job but made it through. He went home to rest for a few minutes before the second job and continued to have pain. No alleviating measures prior to arrival. He has an appointment at [facility] in May for similar issues.  Past Medical History:  Diagnosis Date  . Hypertension   . Myocardial infarct (HCC)     There are no problems to display for this patient.   Past Surgical History:  Procedure Laterality Date  . CORONARY ANGIOPLASTY WITH STENT PLACEMENT    . WRIST SURGERY Left    for cyst    Prior to Admission medications   Medication Sig Start Date End Date Taking? Authorizing Provider  metoprolol tartrate (LOPRESSOR) 50 MG tablet Take 50 mg by mouth 2 (two) times daily.   Yes [provider]  ketorolac (TORADOL) 10 MG tablet Take 1 tablet (10 mg total) by mouth every 6 (six) hours as needed. 09/15/19   Taiwo Fish, Rulon Eisenmenger B, FNP  traMADol (ULTRAM) 50 MG tablet Take 1 tablet (50 mg total) by mouth every 6 (six) hours as needed. 09/15/19   Chinita Pester, FNP    Allergies Patient has no known allergies.  No family history on file.  Social History Social History   Tobacco Use  . Smoking status: Current Every Day Smoker  . Smokeless tobacco: Never Used  Substance Use Topics  . Alcohol use: Not Currently  . Drug use: Not Currently    Review of Systems Constitutional: Well appearing. Respiratory: Negative for dyspnea. Cardiovascular: Negative for change in skin temperature or color. Musculoskeletal:    Negative for chronic steroid use   Negative for trauma in the presence of osteoporosis  Negative for age over 45 and trauma.  Negative for constitutional symptoms, or history of cancer  Negative for pain worse at night. Skin: Negative for rash, lesion, or wound.  Genitourinary: Negative for urinary retention. Rectal: Negative for fecal incontinence or new onset constipation/bowel habit changes. Hematological/Immunilogical: Negative for immunosuppression, IV drug use, or fever Neurological: Positive for burning, tingling, numb, electric, radiating pain in the left lower extremity.                        Negative for saddle anesthesia.                        Negative for focal neurologic deficit, progressive or disabling symptoms             Negative for saddle anesthesia. ____________________________________________   PHYSICAL EXAM:  VITAL SIGNS: ED Triage Vitals [09/15/19 1005]  Enc Vitals Group     BP (!) 150/87     Pulse Rate (!) 107     Resp 16     Temp 98.3 F (36.8 C)     Temp Source Oral     SpO2 95 %     Weight 182 lb (82.6 kg)     Height 5\' 3"  (1.6 m)     Head Circumference      Peak  Flow      Pain Score 9     Pain Loc      Pain Edu?      Excl. in [place]?     Constitutional: Alert and oriented. Well appearing and in no acute distress. Eyes: Conjunctivae are clear without discharge or drainage.  Head: Atraumatic. Neck: Full, active range of motion. Respiratory: Respirations even and unlabored. Musculoskeletal: Full ROM of the back and extremities, Strength 4/5 of the lower extremities as tested. Neurologic: Reflexes of the lower extremities are 2+. Negative for straight leg raise on the right and left side. Skin: Atraumatic.  Psychiatric: Behavior and affect are normal.  ____________________________________________   LABS (all labs ordered are listed, but only abnormal results are displayed)  Labs Reviewed - No data to  display ____________________________________________  RADIOLOGY  Not indicated. ____________________________________________   PROCEDURES  Procedure(s) performed:  Procedures ____________________________________________   INITIAL IMPRESSION / ASSESSMENT AND PLAN / ED COURSE  Jeffery Sweeney is a 59 y.o. male who presents to the emergency department for treatment and evaluation of acute on chronic low back pain without new injury.  Patient was treated with Toradol while in the emergency department.  He will be prescribed Toradol and tramadol and advised to keep his scheduled appointment with [facility].  For symptoms of change or worsen he is to return to the emergency department.  Medications  ketorolac (TORADOL) 30 MG/ML injection 30 mg (30 mg Intramuscular Given 09/15/19 1111)    ED Discharge Orders         Ordered    ketorolac (TORADOL) 10 MG tablet  Every 6 hours PRN    Note to Pharmacy: Injection given in ER   09/15/19 1110    traMADol (ULTRAM) 50 MG tablet  Every 6 hours PRN     09/15/19 1110           Pertinent labs & imaging results that were available during my care of the patient were reviewed by me and considered in my medical decision making (see chart for details).  _________________________________________   FINAL CLINICAL IMPRESSION(S) / ED DIAGNOSES  Final diagnoses:  Acute left-sided back pain with sciatica     If controlled substance prescribed during this visit, 12 month history viewed on the [place] prior to issuing an initial prescription for Schedule II or III opiod.   Victorino Dike, FNP 09/15/19 1526    Nance Pear, MD 09/16/19 [phone]

## 2019-09-15 NOTE — ED Notes (Signed)
See triage note  States he is having lower back pain   Hx of chronic back pain  But states he lifted some thing yesterday and developed pain  Ambulates well

## 2019-09-15 NOTE — ED Triage Notes (Signed)
Pt here for back pain that started last night. Pt has an appt in May but the pain increased last night.

## 2019-12-29 ENCOUNTER — Emergency Department: Payer: Self-pay

## 2019-12-29 ENCOUNTER — Encounter: Payer: Self-pay | Admitting: Emergency Medicine

## 2019-12-29 ENCOUNTER — Other Ambulatory Visit: Payer: Self-pay

## 2019-12-29 ENCOUNTER — Emergency Department
Admission: EM | Admit: 2019-12-29 | Discharge: 2019-12-29 | Disposition: A | Discharge status: Home / Self Care | Payer: Self-pay | Attending: Emergency Medicine | Admitting: Emergency Medicine

## 2019-12-29 DIAGNOSIS — F17200 Nicotine dependence, unspecified, uncomplicated: Secondary | ICD-10-CM | POA: Insufficient documentation

## 2019-12-29 DIAGNOSIS — R079 Chest pain, unspecified: Secondary | ICD-10-CM | POA: Insufficient documentation

## 2019-12-29 DIAGNOSIS — I252 Old myocardial infarction: Secondary | ICD-10-CM | POA: Insufficient documentation

## 2019-12-29 DIAGNOSIS — I10 Essential (primary) hypertension: Secondary | ICD-10-CM | POA: Insufficient documentation

## 2019-12-29 LAB — CBC
HCT: 44.6 % (ref 39.0–52.0)
Hemoglobin: 15.2 g/dL (ref 13.0–17.0)
MCH: 29.8 pg (ref 26.0–34.0)
MCHC: 34.1 g/dL (ref 30.0–36.0)
MCV: 87.5 fL (ref 80.0–100.0)
Platelets: 273 10*3/uL (ref 150–400)
RBC: 5.1 MIL/uL (ref 4.22–5.81)
RDW: 13.2 % (ref 11.5–15.5)
WBC: 7.7 10*3/uL (ref 4.0–10.5)
nRBC: 0 % (ref 0.0–0.2)

## 2019-12-29 LAB — TROPONIN I (HIGH SENSITIVITY)
Troponin I (High Sensitivity): 2 ng/L (ref <18)
Troponin I (High Sensitivity): <2 ng/L (ref <18)

## 2019-12-29 LAB — BASIC METABOLIC PANEL
Anion gap: 10 (ref 5–15)
BUN: 14 mg/dL (ref 6–20)
CO2: 23 mmol/L (ref 22–32)
Calcium: 9 mg/dL (ref 8.9–10.3)
Chloride: 105 mmol/L (ref 98–111)
Creatinine, Ser: 1.08 mg/dL (ref 0.61–1.24)
GFR calc Af Amer: >60 mL/min (ref >60)
GFR calc non Af Amer: >60 mL/min (ref >60)
Glucose, Bld: 110 mg/dL — ABNORMAL HIGH (ref 70–99)
Potassium: 4.2 mmol/L (ref 3.5–5.1)
Sodium: 138 mmol/L (ref 135–145)

## 2019-12-29 MED ORDER — NITROGLYCERIN 2 % TD OINT
1.0000 [in_us] | TOPICAL_OINTMENT | Freq: Once | TRANSDERMAL | Status: AC
  Administered 2019-12-29: 1 [in_us] via TOPICAL
  Filled 2019-12-29: qty 1

## 2019-12-29 MED ORDER — ASPIRIN 81 MG PO CHEW
324.0000 mg | CHEWABLE_TABLET | Freq: Once | ORAL | Status: AC
  Administered 2019-12-29: 324 mg via ORAL
  Filled 2019-12-29: qty 4

## 2019-12-29 MED ORDER — SODIUM CHLORIDE 0.9 % IV SOLN: Freq: Once | INTRAVENOUS | Status: AC

## 2019-12-29 NOTE — Discharge Instructions (Addendum)
Your troponins are completely normal as are your EKGs.  I discussed your case with Dr. Mariah Milling the cardiologist.  He is comfortable letting you go home and following up in the office.  Of course if you get more pain you should return here again.  He should call you to set up a follow-up appointment within the next day or 2.  You can take a baby aspirin every day for now until you see him.

## 2019-12-29 NOTE — ED Triage Notes (Signed)
Pt here with c/o midsternal cp that began while at work this am, hx of MI with stents about 3 years ago, pt states this pain is the same. States his left arm feels heavy as well. NAD.

## 2019-12-29 NOTE — ED Provider Notes (Signed)
[facility] Emergency Department Provider Note   ____________________________________________   First MD Initiated Contact with Patient 12/29/19 1507     (approximate)  I have reviewed the triage vital signs and the nursing notes.   HISTORY  Chief Complaint Chest Pain  HPI RAYNOLD BLANKENBAKER is a 59 y.o. male who reports he was sitting at work this morning and developed chest pain and tightness in the middle of his chest.  It felt the same as the MI he had about 3 years ago when he had stents placed.  His left arm got heavy as well.  He did not have any shortness of breath or sweating.  The pain got better here with aspirin and nitro and is almost gone currently.  Nothing seemed to make it better or worse otherwise.  It lasted constantly until he got here and got the nitro and aspirin.     Past Medical History:  Diagnosis Date  . Hypertension   . Myocardial infarct (HCC)     There are no problems to display for this patient.   Past Surgical History:  Procedure Laterality Date  . CORONARY ANGIOPLASTY WITH STENT PLACEMENT    . WRIST SURGERY Left    for cyst    Prior to Admission medications   Medication Sig Start Date End Date Taking? Authorizing Provider  ketorolac (TORADOL) 10 MG tablet Take 1 tablet (10 mg total) by mouth every 6 (six) hours as needed. 09/15/19   Triplett, Rulon Eisenmenger B, FNP  metoprolol tartrate (LOPRESSOR) 50 MG tablet Take 50 mg by mouth 2 (two) times daily.    [provider]  traMADol (ULTRAM) 50 MG tablet Take 1 tablet (50 mg total) by mouth every 6 (six) hours as needed. 09/15/19   Chinita Pester, FNP    Allergies Patient has no known allergies.  No family history on file.  Social History Social History   Tobacco Use  . Smoking status: Current Every Day Smoker  . Smokeless tobacco: Never Used  Substance Use Topics  . Alcohol use: Not Currently  . Drug use: Not Currently    Review of  Systems  Constitutional: No fever/chills Eyes: No visual changes. ENT: No sore throat. Cardiovascular: chest pain. Respiratory: Denies shortness of breath. Gastrointestinal: No abdominal pain.  No nausea, no vomiting.  No diarrhea.  No constipation. Genitourinary: Negative for dysuria. Musculoskeletal: Negative for back pain. Skin: Negative for rash. Neurological: Negative for headaches, focal weakness  ____________________________________________   PHYSICAL EXAM:  VITAL SIGNS: ED Triage Vitals  Enc Vitals Group     BP 12/29/19 1130 (!) 174/107     Pulse Rate 12/29/19 1130 99     Resp 12/29/19 1130 20     Temp 12/29/19 1130 98.1 F (36.7 C)     Temp Source 12/29/19 1130 Oral     SpO2 12/29/19 1130 97 %     Weight 12/29/19 1128 180 lb (81.6 kg)     Height 12/29/19 1128 5\' 3"  (1.6 m)     Head Circumference --      Peak Flow --      Pain Score 12/29/19 1128 10     Pain Loc --      Pain Edu? --      Excl. in [place]? --     Constitutional: Alert and oriented. Well appearing and in no acute distress. Eyes: Conjunctivae are normal.  Head: Atraumatic. Nose: No congestion/rhinnorhea. Mouth/Throat: Mucous membranes are moist.  Oropharynx non-erythematous. Neck:  No stridor.   Cardiovascular: Normal rate, regular rhythm. Grossly normal heart sounds.  Good peripheral circulation. Respiratory: Normal respiratory effort.  No retractions. Lungs CTAB. Gastrointestinal: Soft and nontender. No distention. No abdominal bruits. No CVA tenderness. Musculoskeletal: No lower extremity tenderness nor edema.   Neurologic:  Normal speech and language. No gross focal neurologic deficits are appreciated.  Skin:  Skin is warm, dry and intact. No rash noted.  ____________________________________________   LABS (all labs ordered are listed, but only abnormal results are displayed)  Labs Reviewed  BASIC METABOLIC PANEL - Abnormal; Notable for the following components:      Result Value    Glucose, Bld 110 (*)    All other components within normal limits  CBC  TROPONIN I (HIGH SENSITIVITY)  TROPONIN I (HIGH SENSITIVITY)   ____________________________________________  EKG EKG #1 read interpreted by me shows sinus tach rate of 106 normal axis no acute ST-T wave changes EKG #2 read interpreted by me shows normal sinus rhythm rate of 76 normal axis also normal  ____________________________________________  RADIOLOGY  ED MD interpretation: Chest x-ray read by radiology reviewed by me shows no acute disease  Official radiology report(s): DG Chest 2 View  Result Date: 12/29/2019 CLINICAL DATA:  Mid chest pain and LEFT arm heaviness, headache, symptoms beginning 30 minutes ago EXAM: CHEST - 2 VIEW COMPARISON:  03/02/2019 FINDINGS: Normal heart size, mediastinal contours, and pulmonary vascularity. External artifact projects over RIGHT clavicle. Bronchitic changes with minimal scarring at LEFT base. No acute infiltrate, pleural effusion or pneumothorax. Mild atherosclerotic calcification aorta. Osseous structures unremarkable. IMPRESSION: Bronchitic changes with LEFT basilar scarring. No acute abnormalities. Electronically Signed   By: Ulyses Southward M.D.   On: 12/29/2019 12:14    ____________________________________________   PROCEDURES  Procedure(s) performed (including Critical Care):  Procedures   ____________________________________________   INITIAL IMPRESSION / ASSESSMENT AND PLAN / ED COURSE  Discussed in detail with Dr. Mariah Milling who also reviewed the labs and EKGs by himself.  Patient in spite of his history of previous MI and stents has a negative troponin and to essentially normal EKGs.  Dr. Mariah Milling feels he is at low risk.  We should be able to discharge him if he is comfortable with that the patient is comfortable with that that is and Dr. Mariah Milling will follow him up in the office rapidly.            ____________________________________________   FINAL  CLINICAL IMPRESSION(S) / ED DIAGNOSES  Final diagnoses:  Chest pain, unspecified type     ED Discharge Orders    None       Note:  This document was prepared using Dragon voice recognition software and may include unintentional dictation errors.    Arnaldo Natal, MD 12/29/19 [id number]

## 2020-01-02 ENCOUNTER — Telehealth: Payer: Self-pay

## 2020-01-02 NOTE — Telephone Encounter (Signed)
Attempted to schedule.  LMOV to call office.    Needs new patient [facility]

## 2020-01-18 NOTE — Telephone Encounter (Signed)
Attempted to schedule.  LMOV to call office.  ° °

## 2020-01-24 NOTE — Telephone Encounter (Signed)
LVM

## 2020-01-30 ENCOUNTER — Ambulatory Visit: Payer: Self-pay

## 2020-01-30 ENCOUNTER — Ambulatory Visit: Payer: Self-pay | Attending: Internal Medicine

## 2020-01-30 DIAGNOSIS — Z23 Encounter for immunization: Secondary | ICD-10-CM

## 2020-01-30 NOTE — Progress Notes (Signed)
   Covid-19 Vaccination Clinic  Name:  Jeffery Sweeney    MRN: [id number] DOB: 19-Aug-1960  01/30/2020  [name] was observed post Covid-19 immunization for 15 minutes without incident. He was provided with Vaccine Information Sheet and instruction to access the V-Safe system.   [name] was instructed to call 911 with any severe reactions post vaccine: Marland Kitchen Difficulty breathing  . Swelling of face and throat  . A fast heartbeat  . A bad rash all over body  . Dizziness and weakness   Immunizations Administered    Name Date Dose VIS Date Route   Pfizer COVID-19 Vaccine 01/30/2020 10:19 AM 0.3 mL 07/13/2018 Intramuscular   Manufacturer: [employer], [employer]   Lot: [id number]   NDC: [address]-9

## 2020-02-12 ENCOUNTER — Encounter: Payer: Self-pay | Admitting: Emergency Medicine

## 2020-02-12 ENCOUNTER — Emergency Department
Admission: EM | Admit: 2020-02-12 | Discharge: 2020-02-12 | Disposition: A | Discharge status: Home / Self Care | Payer: Self-pay | Attending: Emergency Medicine | Admitting: Emergency Medicine

## 2020-02-12 ENCOUNTER — Other Ambulatory Visit: Payer: Self-pay

## 2020-02-12 DIAGNOSIS — K047 Periapical abscess without sinus: Secondary | ICD-10-CM | POA: Insufficient documentation

## 2020-02-12 DIAGNOSIS — Z79899 Other long term (current) drug therapy: Secondary | ICD-10-CM | POA: Insufficient documentation

## 2020-02-12 DIAGNOSIS — F17290 Nicotine dependence, other tobacco product, uncomplicated: Secondary | ICD-10-CM | POA: Insufficient documentation

## 2020-02-12 DIAGNOSIS — I10 Essential (primary) hypertension: Secondary | ICD-10-CM | POA: Insufficient documentation

## 2020-02-12 MED ORDER — AMOXICILLIN 500 MG PO CAPS: 500.0000 mg | ORAL_CAPSULE | Freq: Three times a day (TID) | ORAL | 0 refills | Status: DC

## 2020-02-12 MED ORDER — CEFTRIAXONE SODIUM 1 G IJ SOLR
1.0000 g | Freq: Once | INTRAMUSCULAR | Status: AC
  Administered 2020-02-12: 1 g via INTRAMUSCULAR
  Filled 2020-02-12: qty 10

## 2020-02-12 NOTE — Discharge Instructions (Signed)
OPTIONS FOR DENTAL FOLLOW UP CARE ° °[place] Department of Health and Human Services - Local Safety Net Dental Clinics °http://www.ncdhhs.gov/dph/oralhealth/services/safetynetclinics.htm °  °[facility] (336-562-3123) ° °[facility] [place] ([phone]) ° °[facility] ([phone] ext [phone]) ° °[facility] ([phone]) ° °[facility] ([phone]) °This clinic caters to the indigent population and is on a lottery system. °Location: °[facility], [address], [place] °Clinic Hours: °Wednesdays from 6pm - 9pm, patients seen by a lottery system. °For dates, call or go to [email or web address].[facility].edu/[facility]/patients/Dental-[facility] °Services: °Cleanings, fillings and simple extractions. °Payment Options: °DENTAL WORK IS FREE OF CHARGE. Bring proof of income or support. °Best way to get seen: °Arrive at 5:15 pm - this is a lottery, NOT first come/first serve, so arriving earlier will not increase your chances of being seen. °  °  °[facility] °[phone] °Select option 1 for emergencies °  °Location: °[facility], [address], [place] °Clinic Hours: °No walk-ins accepted - call the day before to schedule an appointment. °Check in times are 9:30 am and 1:30 pm. °Services: °Simple extractions, temporary fillings, pulpectomy/pulp debridement, uncomplicated abscess drainage. °Payment Options: °PAYMENT IS DUE AT THE TIME OF SERVICE.  Fee is usually $100-200, additional surgical procedures (e.g. abscess drainage) may be extra. °Cash, checks, Visa/MasterCard accepted.  Can file Medicaid if patient is covered for dental - patient should call case worker to check. °No discount for [facility] patients. °Best way to get seen: °MUST call the day before and get onto the schedule. Can usually be seen the next 1-2 days. No walk-ins accepted. °  °  °[facility] °[phone] °   °Location: °[facility], [address], [place] °Clinic Hours: °M, W, Th, F 8am or 1:30pm, Tues 9a or 1:30 - first come/first served. °Services: °Simple extractions, temporary fillings, uncomplicated abscess drainage.  You do not need to be an [place] resident. °Payment Options: °PAYMENT IS DUE AT THE TIME OF SERVICE. °Dental insurance, otherwise sliding scale - bring proof of income or support. °Depending on income and treatment needed, cost is usually $50-200. °Best way to get seen: °Arrive early as it is first come/first served. °  °  °[facility] °[phone] °  °Location: °[address] °Clinic Hours: °Mon-Thu 8a-5p °Services: °Most basic dental services including extractions and fillings. °Payment Options: °PAYMENT IS DUE AT THE TIME OF SERVICE. °Sliding scale, up to 50% off - bring proof if income or support. °Medicaid with dental option accepted. °Best way to get seen: °Call to schedule an appointment, can usually be seen within 2 weeks OR they will try to see walk-ins - show up at 8a or 2p (you may have to wait). °  °  °[facility] °[phone] °[place] RESIDENTS ONLY °  °Location: °[facility], [address], [place], [place] [address] °Clinic Hours: By appointment only. °Monday - Thursday 8am-5pm, Friday 8am-12pm °Services: Cleanings, fillings, extractions. °Payment Options: °PAYMENT IS DUE AT THE TIME OF SERVICE. °Cash, Visa or MasterCard. Sliding scale - $30 minimum per service. °Best way to get seen: °Come in to office, complete packet and make an appointment - need proof of income °or support monies for each household member and proof of [place] residence. °Usually takes about a month to get in. °  °  °[facility] °[phone] °  °Location: °[address]   [place] °Clinic Hours: Walk-in Urgent Care Dental Services are offered Monday-Friday  mornings only. °The numbers of emergencies accepted daily is limited to the number of °providers available. °Maximum 15 - Mondays, Wednesdays & Thursdays °Maximum 10 - Tuesdays & Fridays °Services: °You do not need to be a  [place] resident to be seen for a dental emergency. °Emergencies are defined as pain, swelling, abnormal bleeding, or dental trauma. Walkins will receive x-rays if needed. °NOTE: Dental cleaning is not an emergency. °Payment Options: °PAYMENT IS DUE AT THE TIME OF SERVICE. °Minimum co-pay is $40.00 for uninsured patients. °Minimum co-pay is $3.00 for Medicaid with dental coverage. °Dental Insurance is accepted and must be presented at time of visit. °Medicare does not cover dental. °Forms of payment: Cash, credit card, checks. °Best way to get seen: °If not previously registered with the clinic, walk-in dental registration begins at 7:15 am and is on a first come/first serve basis. °If previously registered with the clinic, call to make an appointment. °  °  °[facility] °[phone] °[place] RESIDENTS ONLY °  °Location: °[address], [place], [place] °Clinic Hours: °Mon-Thu 10a-2p °Services: Extractions only! °Payment Options: °FREE (donations accepted) - bring proof of income or support °Best way to get seen: °Call and schedule an appointment OR come at 8am on the 1st Monday of every month (except for holidays) when it is first come/first served. °  °  °[facility] °[phone] °  °Location: °[address], [place] °Clinic Hours: °Friday mornings °Services, Payment Options, Best way to get seen: °Call for info °

## 2020-02-12 NOTE — ED Triage Notes (Addendum)
Pt arrived via POV with reports of dental abscess x 2 days ago, pt states 3 days ago he chipped a tooth on the right side.  Does not have a Education officer, community.

## 2020-02-12 NOTE — ED Provider Notes (Signed)
[facility] Emergency Department Provider Note  ____________________________________________  Time seen: Approximately 3:03 PM  I have reviewed the triage vital signs and the nursing notes.   HISTORY  Chief Complaint Dental Pain    HPI Jeffery Sweeney is a 59 y.o. male that presents to the emergency department for evaluation of right upper dental pain with right cheek swelling for 2 days.  Patient states that he was eating a crispy chicken tender when part of his tooth broke off.  Yesterday, his right cheek started to swell next to his nose.  He does not have a dentist.  No fevers.   Past Medical History:  Diagnosis Date   Hypertension    Myocardial infarct (HCC)     There are no problems to display for this patient.   Past Surgical History:  Procedure Laterality Date   CORONARY ANGIOPLASTY WITH STENT PLACEMENT     WRIST SURGERY Left    for cyst    Prior to Admission medications   Medication Sig Start Date End Date Taking? Authorizing Provider  amoxicillin (AMOXIL) 500 MG capsule Take 1 capsule (500 mg total) by mouth 3 (three) times daily. 02/12/20   Enid Derry, PA-C  ketorolac (TORADOL) 10 MG tablet Take 1 tablet (10 mg total) by mouth every 6 (six) hours as needed. 09/15/19   Triplett, Rulon Eisenmenger B, FNP  metoprolol tartrate (LOPRESSOR) 50 MG tablet Take 50 mg by mouth 2 (two) times daily.    [provider]  traMADol (ULTRAM) 50 MG tablet Take 1 tablet (50 mg total) by mouth every 6 (six) hours as needed. 09/15/19   Chinita Pester, FNP    Allergies Patient has no known allergies.  History reviewed. No pertinent family history.  Social History Social History   Tobacco Use   Smoking status: Current Every Day Smoker   Smokeless tobacco: Never Used  Substance Use Topics   Alcohol use: Not Currently   Drug use: Not Currently     Review of Systems  Constitutional: No fever/chills Cardiovascular: No chest  pain. Respiratory: No SOB. Gastrointestinal: No abdominal pain.  No vomiting.  Musculoskeletal: Negative for musculoskeletal pain. Skin: Negative for rash, abrasions, lacerations, ecchymosis.   ____________________________________________   PHYSICAL EXAM:  VITAL SIGNS: ED Triage Vitals  Enc Vitals Group     BP 02/12/20 1320 (!) 136/98     Pulse Rate 02/12/20 1320 96     Resp 02/12/20 1320 18     Temp 02/12/20 1320 97.9 F (36.6 C)     Temp Source 02/12/20 1320 Oral     SpO2 02/12/20 1320 97 %     Weight 02/12/20 1323 165 lb (74.8 kg)     Height 02/12/20 1323 5' 3.5" (1.613 m)     Head Circumference --      Peak Flow --      Pain Score 02/12/20 1323 5     Pain Loc --      Pain Edu? --      Excl. in GC? --      Constitutional: Alert and oriented. Well appearing and in no acute distress. Eyes: Conjunctivae are normal. PERRL. EOMI. Head: Atraumatic. ENT:      Ears:      Nose: No congestion/rhinnorhea.      Mouth/Throat: Mucous membranes are moist.  Poor dentition.  Tenderness to palpation with surrounding swelling to right cheek proximal to the right nares. Neck: No stridor.  Cardiovascular: Normal rate, regular rhythm.  Good peripheral  circulation. Respiratory: Normal respiratory effort without tachypnea or retractions. Lungs CTAB. Good air entry to the bases with no decreased or absent breath sounds. Musculoskeletal: Full range of motion to all extremities. No gross deformities appreciated. Neurologic:  Normal speech and language. No gross focal neurologic deficits are appreciated.  Skin:  Skin is warm, dry and intact. No rash noted. Psychiatric: Mood and affect are normal. Speech and behavior are normal. Patient exhibits appropriate insight and judgement.   ____________________________________________   LABS (all labs ordered are listed, but only abnormal results are displayed)  Labs Reviewed - No data to  display ____________________________________________  EKG   ____________________________________________  RADIOLOGY   No results found.  ____________________________________________    PROCEDURES  Procedure(s) performed:    Procedures    Medications  cefTRIAXone (ROCEPHIN) injection 1 g (has no administration in time range)     ____________________________________________   INITIAL IMPRESSION / ASSESSMENT AND PLAN / ED COURSE  Pertinent labs & imaging results that were available during my care of the patient were reviewed by me and considered in my medical decision making (see chart for details).  Review of the [place] CSRS was performed in accordance of the [facility] prior to dispensing any controlled drugs.    Patient's diagnosis is consistent with dental abscess.  Vital signs and exam are reassuring.  Patient was given a dose of IM Rocephin in the emergency department.  Patient will be discharged home with prescriptions for amoxicillin. Patient is to follow up with dentist as directed. Patient is given ED precautions to return to the ED for any worsening or new symptoms.   Jeffery Sweeney was evaluated in Emergency Department on 02/12/2020 for the symptoms described in the history of present illness. He was evaluated in the context of the global COVID-19 pandemic, which necessitated consideration that the patient might be at risk for infection with the SARS-CoV-2 virus that causes COVID-19. Institutional protocols and algorithms that pertain to the evaluation of patients at risk for COVID-19 are in a state of rapid change based on information released by regulatory bodies including the CDC and federal and state organizations. These policies and algorithms were followed during the patient's care in the ED.  ____________________________________________  FINAL CLINICAL IMPRESSION(S) / ED DIAGNOSES  Final diagnoses:  Dental abscess      NEW MEDICATIONS STARTED DURING  THIS VISIT:  ED Discharge Orders         Ordered    amoxicillin (AMOXIL) 500 MG capsule  3 times daily        02/12/20 1459              This chart was dictated using voice recognition software/Dragon. Despite best efforts to proofread, errors can occur which can change the meaning. Any change was purely unintentional.    Enid Derry, PA-C 02/13/20 1007    Dionne Bucy, MD 02/13/20 2259

## 2020-02-12 NOTE — ED Notes (Signed)
Pt answered to another pt's name from lobby, but can recall his own name, is AOX4. Pt c/o right cheek swelling and "gums swollen" x3 days. Redness noted to left upper cheek.

## 2020-02-20 ENCOUNTER — Ambulatory Visit: Payer: Self-pay | Attending: Internal Medicine

## 2020-02-20 DIAGNOSIS — Z23 Encounter for immunization: Secondary | ICD-10-CM

## 2020-02-20 NOTE — Progress Notes (Signed)
   Covid-19 Vaccination Clinic  Name:  Jeffery Sweeney    MRN: [id number] DOB: 06-16-1960  02/20/2020  Mr. [name] was observed post Covid-19 immunization for 15 minutes without incident. He was provided with Vaccine Information Sheet and instruction to access the V-Safe system.   Mr. [name] was instructed to call 911 with any severe reactions post vaccine: Marland Kitchen Difficulty breathing  . Swelling of face and throat  . A fast heartbeat  . A bad rash all over body  . Dizziness and weakness   Immunizations Administered    Name Date Dose VIS Date Route   Pfizer COVID-19 Vaccine 02/20/2020 10:36 AM 0.3 mL 07/13/2018 Intramuscular   Manufacturer: [employer], [employer]   Lot: [id number]   NDC: [address]-1

## 2020-02-24 ENCOUNTER — Emergency Department: Payer: Self-pay

## 2020-02-24 ENCOUNTER — Emergency Department
Admission: EM | Admit: 2020-02-24 | Discharge: 2020-02-24 | Disposition: A | Discharge status: Home / Self Care | Payer: Self-pay | Attending: Emergency Medicine | Admitting: Emergency Medicine

## 2020-02-24 ENCOUNTER — Other Ambulatory Visit: Payer: Self-pay

## 2020-02-24 ENCOUNTER — Encounter: Payer: Self-pay | Admitting: Emergency Medicine

## 2020-02-24 DIAGNOSIS — I10 Essential (primary) hypertension: Secondary | ICD-10-CM | POA: Insufficient documentation

## 2020-02-24 DIAGNOSIS — Y9389 Activity, other specified: Secondary | ICD-10-CM | POA: Insufficient documentation

## 2020-02-24 DIAGNOSIS — M7051 Other bursitis of knee, right knee: Secondary | ICD-10-CM | POA: Insufficient documentation

## 2020-02-24 DIAGNOSIS — F17200 Nicotine dependence, unspecified, uncomplicated: Secondary | ICD-10-CM | POA: Insufficient documentation

## 2020-02-24 MED ORDER — MELOXICAM 15 MG PO TABS: 15.0000 mg | ORAL_TABLET | Freq: Every day | ORAL | 2 refills | Status: DC

## 2020-02-24 NOTE — ED Provider Notes (Signed)
[facility] Emergency Department Provider Note  ____________________________________________   First MD Initiated Contact with Patient 02/24/20 1033     (approximate)  I have reviewed the triage vital signs and the nursing notes.   HISTORY  Chief Complaint Knee Pain    HPI Jeffery Sweeney is a 59 y.o. male presents emergency department complaining of left knee pain since yesterday.  Patient states he was at work and noticed that left knee began to swell.  No known injury.  States he put ice on it last night and swelling to the somehow come down.  States he called into work because area is painful.  No numbness or tingling.    Past Medical History:  Diagnosis Date  . Hypertension   . Myocardial infarct (HCC)     There are no problems to display for this patient.   Past Surgical History:  Procedure Laterality Date  . CORONARY ANGIOPLASTY WITH STENT PLACEMENT    . WRIST SURGERY Left    for cyst    Prior to Admission medications   Medication Sig Start Date End Date Taking? Authorizing Provider  amoxicillin (AMOXIL) 500 MG capsule Take 1 capsule (500 mg total) by mouth 3 (three) times daily. 02/12/20   Enid Derry, PA-C  meloxicam (MOBIC) 15 MG tablet Take 1 tablet (15 mg total) by mouth daily. 02/24/20 02/23/21  Selene Peltzer, Roselyn Bering, PA-C  metoprolol tartrate (LOPRESSOR) 50 MG tablet Take 50 mg by mouth 2 (two) times daily.    [provider]    Allergies Patient has no known allergies.  No family history on file.  Social History Social History   Tobacco Use  . Smoking status: Current Every Day Smoker  . Smokeless tobacco: Never Used  Substance Use Topics  . Alcohol use: Not Currently  . Drug use: Not Currently    Review of Systems  Constitutional: No fever/chills Eyes: No visual changes. ENT: No sore throat. Respiratory: Denies cough Genitourinary: Negative for dysuria. Musculoskeletal: Negative for back pain.  Positive  for left knee pain Skin: Negative for rash. Psychiatric: no mood changes,     ____________________________________________   PHYSICAL EXAM:  VITAL SIGNS: ED Triage Vitals  Enc Vitals Group     BP 02/24/20 1019 (!) 155/97     Pulse Rate 02/24/20 1019 93     Resp 02/24/20 1019 16     Temp 02/24/20 1019 98 F (36.7 C)     Temp Source 02/24/20 1019 Oral     SpO2 02/24/20 1019 98 %     Weight 02/24/20 1015 164 lb 14.5 oz (74.8 kg)     Height 02/24/20 1015 5\' 3"  (1.6 m)     Head Circumference --      Peak Flow --      Pain Score 02/24/20 1014 8     Pain Loc --      Pain Edu? --      Excl. in GC? --     Constitutional: Alert and oriented. Well appearing and in no acute distress. Eyes: Conjunctivae are normal.  Head: Atraumatic. Nose: No congestion/rhinnorhea. Mouth/Throat: Mucous membranes are moist.   Neck:  supple no lymphadenopathy noted Cardiovascular: Normal rate, regular rhythm.  Respiratory: Normal respiratory effort.  No retractions, GU: deferred Musculoskeletal: FROM all extremities, warm and well perfused, left knee has tenderness and some swelling at the patella bursa, joint lines mildly tender, neurovascular is intact Neurologic:  Normal speech and language.  Skin:  Skin is warm,  dry and intact. No rash noted. Psychiatric: Mood and affect are normal. Speech and behavior are normal.  ____________________________________________   LABS (all labs ordered are listed, but only abnormal results are displayed)  Labs Reviewed - No data to display ____________________________________________   ____________________________________________  RADIOLOGY  X-ray of the left knee is negative  ____________________________________________   PROCEDURES  Procedure(s) performed: Knee immobilizer applied by the tech   Procedures    ____________________________________________   INITIAL IMPRESSION / ASSESSMENT AND PLAN / ED COURSE  Pertinent labs & imaging  results that were available during my care of the patient were reviewed by me and considered in my medical decision making (see chart for details).   Patient is a 60 year old male presents emergency department left knee pain.  See HPI.  Physical exam is consistent with bursitis or knee effusion.  X-ray of the left knee   X-ray left knee is negative.  Explained findings to the patient.  Told him this is more of a prepatellar bursitis.  Placed in knee immobilizer to decrease inflammation and swelling.  He is to take meloxicam daily.  Follow-up with his regular doctor or orthopedics if not improving in 5 to 7 days.  Apply ice.  Is given a work note discharged stable condition.  Jeffery Sweeney was evaluated in Emergency Department on 02/24/2020 for the symptoms described in the history of present illness. He was evaluated in the context of the global COVID-19 pandemic, which necessitated consideration that the patient might be at risk for infection with the SARS-CoV-2 virus that causes COVID-19. Institutional protocols and algorithms that pertain to the evaluation of patients at risk for COVID-19 are in a state of rapid change based on information released by regulatory bodies including the CDC and federal and state organizations. These policies and algorithms were followed during the patient's care in the ED.    As part of my medical decision making, I reviewed the following data within the electronic MEDICAL RECORD NUMBER Nursing notes reviewed and incorporated, Old chart reviewed, Radiograph reviewed , Notes from prior ED visits and [place] Controlled Substance Database  ____________________________________________   FINAL CLINICAL IMPRESSION(S) / ED DIAGNOSES  Final diagnoses:  Bursitis of other bursa of right knee      NEW MEDICATIONS STARTED DURING THIS VISIT:  Discharge Medication List as of 02/24/2020 12:18 PM    START taking these medications   Details  meloxicam (MOBIC) 15 MG tablet Take 1  tablet (15 mg total) by mouth daily., Starting Fri 02/24/2020, Until Sat 02/23/2021, Normal         Note:  This document was prepared using Dragon voice recognition software and may include unintentional dictation errors.    Faythe Ghee, PA-C 02/24/20 1319    Sharman Cheek, MD 02/24/20 [phone]

## 2020-02-24 NOTE — Discharge Instructions (Signed)
Follow-up with [facility] orthopedics if not improving in 5 days.  Take medication as prescribed.  Apply ice to the left knee.  Wear the knee immobilizer to help with discomfort and inflammation.

## 2020-02-24 NOTE — ED Triage Notes (Signed)
Left knee pain since yesterday.  No injury
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
## 2020-04-09 NOTE — Telephone Encounter (Signed)
Patient unavailable per person answering

## 2020-04-18 NOTE — Telephone Encounter (Signed)
LVM to clarify if patient would like to schedule

## 2020-06-19 DIAGNOSIS — I214 Non-ST elevation (NSTEMI) myocardial infarction: Secondary | ICD-10-CM

## 2020-06-19 HISTORY — DX: Non-ST elevation (NSTEMI) myocardial infarction: I21.4

## 2020-06-28 ENCOUNTER — Other Ambulatory Visit: Payer: Self-pay

## 2020-06-28 ENCOUNTER — Emergency Department
Admission: EM | Admit: 2020-06-28 | Discharge: 2020-06-28 | Disposition: A | Discharge status: Home / Self Care | Payer: Self-pay | Attending: Emergency Medicine | Admitting: Emergency Medicine

## 2020-06-28 ENCOUNTER — Encounter: Payer: Self-pay | Admitting: Emergency Medicine

## 2020-06-28 ENCOUNTER — Emergency Department: Payer: Self-pay

## 2020-06-28 DIAGNOSIS — Z87891 Personal history of nicotine dependence: Secondary | ICD-10-CM | POA: Insufficient documentation

## 2020-06-28 DIAGNOSIS — R42 Dizziness and giddiness: Secondary | ICD-10-CM | POA: Insufficient documentation

## 2020-06-28 DIAGNOSIS — R55 Syncope and collapse: Secondary | ICD-10-CM | POA: Insufficient documentation

## 2020-06-28 DIAGNOSIS — Z79899 Other long term (current) drug therapy: Secondary | ICD-10-CM | POA: Insufficient documentation

## 2020-06-28 DIAGNOSIS — I10 Essential (primary) hypertension: Secondary | ICD-10-CM | POA: Insufficient documentation

## 2020-06-28 LAB — CBC
HCT: 44.3 % (ref 39.0–52.0)
Hemoglobin: 15.3 g/dL (ref 13.0–17.0)
MCH: 30.1 pg (ref 26.0–34.0)
MCHC: 34.5 g/dL (ref 30.0–36.0)
MCV: 87 fL (ref 80.0–100.0)
Platelets: 279 10*3/uL (ref 150–400)
RBC: 5.09 MIL/uL (ref 4.22–5.81)
RDW: 13.2 % (ref 11.5–15.5)
WBC: 6.8 10*3/uL (ref 4.0–10.5)
nRBC: 0 % (ref 0.0–0.2)

## 2020-06-28 LAB — TROPONIN I (HIGH SENSITIVITY)
Troponin I (High Sensitivity): 3 ng/L (ref <18)
Troponin I (High Sensitivity): 3 ng/L (ref <18)

## 2020-06-28 LAB — URINALYSIS, COMPLETE (UACMP) WITH MICROSCOPIC
Bacteria, UA: NONE SEEN
Bilirubin Urine: NEGATIVE
Glucose, UA: NEGATIVE mg/dL
Hgb urine dipstick: NEGATIVE
Ketones, ur: NEGATIVE mg/dL
Leukocytes,Ua: NEGATIVE
Nitrite: NEGATIVE
Protein, ur: NEGATIVE mg/dL
Specific Gravity, Urine: 1.027 (ref 1.005–1.030)
pH: 5 (ref 5.0–8.0)

## 2020-06-28 LAB — BASIC METABOLIC PANEL
Anion gap: 9 (ref 5–15)
BUN: 17 mg/dL (ref 6–20)
CO2: 20 mmol/L — ABNORMAL LOW (ref 22–32)
Calcium: 8.7 mg/dL — ABNORMAL LOW (ref 8.9–10.3)
Chloride: 107 mmol/L (ref 98–111)
Creatinine, Ser: 0.89 mg/dL (ref 0.61–1.24)
GFR, Estimated: >60 mL/min (ref >60)
Glucose, Bld: 105 mg/dL — ABNORMAL HIGH (ref 70–99)
Potassium: 3.7 mmol/L (ref 3.5–5.1)
Sodium: 136 mmol/L (ref 135–145)

## 2020-06-28 NOTE — ED Notes (Signed)
Patient transported to CT 

## 2020-06-28 NOTE — ED Notes (Signed)
Troponin collected via straight stick venous puncture - L hand, by this RN at this time. Troponin labeled at bedside, sent to lab.

## 2020-06-28 NOTE — ED Triage Notes (Signed)
Pt in via POV, reports waking up this am, going to stand up from bed, w/ syncopal episode upon standing.  Denies hx of same.  Pt drove self here.  Vitals WDL, NAD noted at this time.

## 2020-06-28 NOTE — ED Notes (Signed)
Patient updated on POC by Dr. Larinda Buttery at bedside.

## 2020-06-28 NOTE — ED Notes (Signed)
MD at bedside. 

## 2020-06-28 NOTE — ED Notes (Signed)
Patient returned from CT. Patient is resting comfortably.

## 2020-06-28 NOTE — ED Notes (Signed)
Patient transported to ED lobby via wheelchair with this RN. Patient alert, oriented x4. Patient denies complaints.

## 2020-06-28 NOTE — ED Notes (Signed)
Patient is resting comfortably. 

## 2020-06-28 NOTE — ED Notes (Signed)
Patient updated on POC.

## 2020-06-28 NOTE — ED Notes (Signed)
Patient provided with discharge instructions. Patient requesting work excuses x2. Patient denies questions. Respirations even and unlabored. Gait steady.

## 2020-06-28 NOTE — ED Provider Notes (Signed)
[facility] Emergency Department Provider Note   ____________________________________________   Event Date/Time   First MD Initiated Contact with Patient 06/28/20 1539     (approximate)  I have reviewed the triage vital signs and the nursing notes.   HISTORY  Chief Complaint Loss of Consciousness    HPI Jeffery Sweeney is a 60 y.o. male with past medical history of hypertension and CAD who presents to the ED for syncope.  Patient states that around noon today he suddenly started feeling dizzy and like the room was spinning around him.  He also had onset of lightheadedness and eventually lost consciousness for a brief period of time.  He denies any associated chest pain or shortness of breath.  He is feeling slightly better after waking up, but describes a feeling of generalized weakness.  He has been feeling well earlier in the day with no fevers, cough, vomiting, diarrhea, or abdominal pain.  He denies any history of similar symptoms, does state he had stents placed in his heart greater than 10 years ago, but does not currently follow with cardiology locally.        Past Medical History:  Diagnosis Date  . Hypertension   . Myocardial infarct (HCC)     There are no problems to display for this patient.   Past Surgical History:  Procedure Laterality Date  . CORONARY ANGIOPLASTY WITH STENT PLACEMENT    . WRIST SURGERY Left    for cyst    Prior to Admission medications   Medication Sig Start Date End Date Taking? Authorizing Provider  amoxicillin (AMOXIL) 500 MG capsule Take 1 capsule (500 mg total) by mouth 3 (three) times daily. 02/12/20   Enid Derry, PA-C  meloxicam (MOBIC) 15 MG tablet Take 1 tablet (15 mg total) by mouth daily. 02/24/20 02/23/21  Fisher, Roselyn Bering, PA-C  metoprolol tartrate (LOPRESSOR) 50 MG tablet Take 50 mg by mouth 2 (two) times daily.    [provider]    Allergies Patient has no known allergies.  No family  history on file.  Social History Social History   Tobacco Use  . Smoking status: Former Smoker    Types: Cigarettes  . Smokeless tobacco: Never Used  Vaping Use  . Vaping Use: Never used  Substance Use Topics  . Alcohol use: Not Currently  . Drug use: Not Currently    Review of Systems  Constitutional: No fever/chills Eyes: No visual changes. ENT: No sore throat. Cardiovascular: Denies chest pain.  Positive for syncope. Respiratory: Denies shortness of breath. Gastrointestinal: No abdominal pain.  No nausea, no vomiting.  No diarrhea.  No constipation. Genitourinary: Negative for dysuria. Musculoskeletal: Negative for back pain. Skin: Negative for rash. Neurological: Negative for headaches, focal weakness or numbness.  ____________________________________________   PHYSICAL EXAM:  VITAL SIGNS: ED Triage Vitals [06/28/20 1515]  Enc Vitals Group     BP (!) 145/95     Pulse Rate 98     Resp 16     Temp 98 F (36.7 C)     Temp Source Oral     SpO2 95 %     Weight 194 lb (88 kg)     Height 5\' 3"  (1.6 m)     Head Circumference      Peak Flow      Pain Score 0     Pain Loc      Pain Edu?      Excl. in GC?  Constitutional: Alert and oriented. Eyes: Conjunctivae are normal.  Pupils equal and reactive to light bilaterally. Head: Atraumatic. Nose: No congestion/rhinnorhea. Mouth/Throat: Mucous membranes are moist. Neck: Normal ROM, no meningismus. Cardiovascular: Normal rate, regular rhythm. Grossly normal heart sounds.  2+ radial pulses bilaterally. Respiratory: Normal respiratory effort.  No retractions. Lungs CTAB. Gastrointestinal: Soft and nontender. No distention. Genitourinary: deferred Musculoskeletal: No lower extremity tenderness nor edema. Neurologic:  Normal speech and language. No gross focal neurologic deficits are appreciated. Skin:  Skin is warm, dry and intact. No rash noted. Psychiatric: Mood and affect are normal. Speech and behavior are  normal.  ____________________________________________   LABS (all labs ordered are listed, but only abnormal results are displayed)  Labs Reviewed  BASIC METABOLIC PANEL - Abnormal; Notable for the following components:      Result Value   CO2 20 (*)    Glucose, Bld 105 (*)    Calcium 8.7 (*)    All other components within normal limits  URINALYSIS, COMPLETE (UACMP) WITH MICROSCOPIC - Abnormal; Notable for the following components:   Color, Urine YELLOW (*)    APPearance CLEAR (*)    All other components within normal limits  CBC  CBG MONITORING, ED  TROPONIN I (HIGH SENSITIVITY)  TROPONIN I (HIGH SENSITIVITY)   ____________________________________________  EKG  ED ECG REPORT I, Chesley Noon, the attending physician, personally viewed and interpreted this ECG.   Date: 06/28/2020  EKG Time: 15:35  Rate: 101  Rhythm: sinus tachycardia  Axis: Normal  Intervals:none  ST&T Change: None   PROCEDURES  Procedure(s) performed (including Critical Care):  .1-3 Lead EKG Interpretation Performed by: Chesley Noon, MD Authorized by: Chesley Noon, MD     Interpretation: normal     ECG rate:  82   ECG rate assessment: normal     Rhythm: sinus rhythm     Ectopy: none     Conduction: normal       ____________________________________________   INITIAL IMPRESSION / ASSESSMENT AND PLAN / ED COURSE       60 year old male with past medical history of hypertension and CAD who presents to the ED for dizziness and syncope around noon today.  He describes both feeling like the room was spinning around him and lightheaded to the point that he eventually passed out.  He initially had some headache with this, however headache is now resolving.  We will screen CT head to ensure no SAH but he has no focal neurologic deficits at this time.  Given component of lightheadedness and syncope, I have a lower suspicion for posterior stroke.  We will observe on cardiac monitor, labs  thus far are reassuring but we will add on troponin.  No events noted on cardiac monitor, 2 sets of troponin are negative.  CT head is negative for acute process and patient without any dizziness here in the ED, low suspicion for acute stroke.  Patient is appropriate for discharge home with cardiology follow-up, he was counseled to return to the ED for new worsening symptoms.  Patient agrees with plan.      ____________________________________________   FINAL CLINICAL IMPRESSION(S) / ED DIAGNOSES  Final diagnoses:  Syncope, unspecified syncope type     ED Discharge Orders    None       Note:  This document was prepared using Dragon voice recognition software and may include unintentional dictation errors.   Chesley Noon, MD 06/28/20 1840

## 2020-07-05 ENCOUNTER — Inpatient Hospital Stay
Admission: EM | Admit: 2020-07-05 | Discharge: 2020-07-06 | DRG: 282 | Disposition: A | Discharge status: Other Acute Inpatient Hospital | Payer: Self-pay | Attending: Internal Medicine | Admitting: Internal Medicine

## 2020-07-05 ENCOUNTER — Other Ambulatory Visit: Payer: Self-pay

## 2020-07-05 ENCOUNTER — Emergency Department: Payer: Self-pay

## 2020-07-05 DIAGNOSIS — R079 Chest pain, unspecified: Secondary | ICD-10-CM

## 2020-07-05 DIAGNOSIS — Z955 Presence of coronary angioplasty implant and graft: Secondary | ICD-10-CM

## 2020-07-05 DIAGNOSIS — I252 Old myocardial infarction: Secondary | ICD-10-CM

## 2020-07-05 DIAGNOSIS — I10 Essential (primary) hypertension: Secondary | ICD-10-CM | POA: Diagnosis present

## 2020-07-05 DIAGNOSIS — Z79899 Other long term (current) drug therapy: Secondary | ICD-10-CM

## 2020-07-05 DIAGNOSIS — Z791 Long term (current) use of non-steroidal anti-inflammatories (NSAID): Secondary | ICD-10-CM

## 2020-07-05 DIAGNOSIS — I200 Unstable angina: Secondary | ICD-10-CM

## 2020-07-05 DIAGNOSIS — F17210 Nicotine dependence, cigarettes, uncomplicated: Secondary | ICD-10-CM | POA: Diagnosis present

## 2020-07-05 DIAGNOSIS — Z720 Tobacco use: Secondary | ICD-10-CM

## 2020-07-05 DIAGNOSIS — I25110 Atherosclerotic heart disease of native coronary artery with unstable angina pectoris: Principal | ICD-10-CM | POA: Diagnosis present

## 2020-07-05 DIAGNOSIS — I739 Peripheral vascular disease, unspecified: Secondary | ICD-10-CM | POA: Diagnosis present

## 2020-07-05 DIAGNOSIS — Z20822 Contact with and (suspected) exposure to covid-19: Secondary | ICD-10-CM | POA: Diagnosis present

## 2020-07-05 DIAGNOSIS — I214 Non-ST elevation (NSTEMI) myocardial infarction: Secondary | ICD-10-CM | POA: Diagnosis present

## 2020-07-05 LAB — COMPREHENSIVE METABOLIC PANEL
ALT: 16 U/L (ref 0–44)
AST: 18 U/L (ref 15–41)
Albumin: 4.1 g/dL (ref 3.5–5.0)
Alkaline Phosphatase: 82 U/L (ref 38–126)
Anion gap: 9 (ref 5–15)
BUN: 18 mg/dL (ref 6–20)
CO2: 22 mmol/L (ref 22–32)
Calcium: 10.3 mg/dL (ref 8.9–10.3)
Chloride: 108 mmol/L (ref 98–111)
Creatinine, Ser: 0.89 mg/dL (ref 0.61–1.24)
GFR, Estimated: >60 mL/min (ref >60)
Glucose, Bld: 113 mg/dL — ABNORMAL HIGH (ref 70–99)
Potassium: 3.8 mmol/L (ref 3.5–5.1)
Sodium: 139 mmol/L (ref 135–145)
Total Bilirubin: 1 mg/dL (ref 0.3–1.2)
Total Protein: 7.6 g/dL (ref 6.5–8.1)

## 2020-07-05 LAB — CBC WITH DIFFERENTIAL/PLATELET
Abs Immature Granulocytes: 0.02 10*3/uL (ref 0.00–0.07)
Basophils Absolute: 0 10*3/uL (ref 0.0–0.1)
Basophils Relative: 1 %
Eosinophils Absolute: 0.2 10*3/uL (ref 0.0–0.5)
Eosinophils Relative: 2 %
HCT: 44.4 % (ref 39.0–52.0)
Hemoglobin: 15.1 g/dL (ref 13.0–17.0)
Immature Granulocytes: 0 %
Lymphocytes Relative: 43 %
Lymphs Abs: 3.5 10*3/uL (ref 0.7–4.0)
MCH: 29.7 pg (ref 26.0–34.0)
MCHC: 34 g/dL (ref 30.0–36.0)
MCV: 87.2 fL (ref 80.0–100.0)
Monocytes Absolute: 0.4 10*3/uL (ref 0.1–1.0)
Monocytes Relative: 5 %
Neutro Abs: 4.1 10*3/uL (ref 1.7–7.7)
Neutrophils Relative %: 49 %
Platelets: 287 10*3/uL (ref 150–400)
RBC: 5.09 MIL/uL (ref 4.22–5.81)
RDW: 12.9 % (ref 11.5–15.5)
WBC: 8.3 10*3/uL (ref 4.0–10.5)
nRBC: 0 % (ref 0.0–0.2)

## 2020-07-05 LAB — APTT: aPTT: 29 seconds (ref 24–36)

## 2020-07-05 LAB — RESP PANEL BY RT-PCR (FLU A&B, COVID) ARPGX2
Influenza A by PCR: NEGATIVE
Influenza B by PCR: NEGATIVE
SARS Coronavirus 2 by RT PCR: NEGATIVE

## 2020-07-05 LAB — TROPONIN I (HIGH SENSITIVITY)
Troponin I (High Sensitivity): 25 ng/L — ABNORMAL HIGH (ref <18)
Troponin I (High Sensitivity): 76 ng/L — ABNORMAL HIGH (ref <18)

## 2020-07-05 LAB — ETHANOL: Alcohol, Ethyl (B): <10 mg/dL (ref <10)

## 2020-07-05 LAB — PROTIME-INR
INR: 1 (ref 0.8–1.2)
Prothrombin Time: 12.4 seconds (ref 11.4–15.2)

## 2020-07-05 LAB — MAGNESIUM: Magnesium: 2.3 mg/dL (ref 1.7–2.4)

## 2020-07-05 LAB — BRAIN NATRIURETIC PEPTIDE: B Natriuretic Peptide: 17.3 pg/mL (ref 0.0–100.0)

## 2020-07-05 MED ORDER — ATORVASTATIN CALCIUM 80 MG PO TABS
80.0000 mg | ORAL_TABLET | Freq: Once | ORAL | Status: AC
  Administered 2020-07-05: 80 mg via ORAL
  Filled 2020-07-05: qty 1

## 2020-07-05 MED ORDER — INFLUENZA VAC SPLIT QUAD 0.5 ML IM SUSY
0.5000 mL | PREFILLED_SYRINGE | INTRAMUSCULAR | Status: DC
  Filled 2020-07-05: qty 0.5

## 2020-07-05 MED ORDER — ONDANSETRON HCL 4 MG/2ML IJ SOLN: 4.0000 mg | Freq: Four times a day (QID) | INTRAMUSCULAR | Status: DC | PRN

## 2020-07-05 MED ORDER — ACETAMINOPHEN 650 MG RE SUPP: 650.0000 mg | Freq: Four times a day (QID) | RECTAL | Status: DC | PRN

## 2020-07-05 MED ORDER — ACETAMINOPHEN 325 MG PO TABS: 650.0000 mg | ORAL_TABLET | Freq: Four times a day (QID) | ORAL | Status: DC | PRN

## 2020-07-05 MED ORDER — HEPARIN (PORCINE) 25000 UT/250ML-% IV SOLN
1200.0000 [IU]/h | INTRAVENOUS | Status: DC
  Administered 2020-07-06: 900 [IU]/h via INTRAVENOUS
  Filled 2020-07-05: qty 250

## 2020-07-05 MED ORDER — NITROGLYCERIN IN D5W 200-5 MCG/ML-% IV SOLN: 0.0000 ug/min | INTRAVENOUS | Status: DC

## 2020-07-05 MED ORDER — NITROGLYCERIN IN D5W 200-5 MCG/ML-% IV SOLN
INTRAVENOUS | Status: AC
  Administered 2020-07-05: 5 ug/min via INTRAVENOUS
  Filled 2020-07-05: qty 250

## 2020-07-05 MED ORDER — METOPROLOL TARTRATE 25 MG PO TABS
12.5000 mg | ORAL_TABLET | Freq: Two times a day (BID) | ORAL | Status: DC
Start: 2020-07-05 — End: 2020-07-06
  Administered 2020-07-05 – 2020-07-06 (×2): 12.5 mg via ORAL
  Filled 2020-07-05 (×2): qty 1

## 2020-07-05 MED ORDER — HEPARIN BOLUS VIA INFUSION
4000.0000 [IU] | Freq: Once | INTRAVENOUS | Status: AC
  Administered 2020-07-06: 4000 [IU] via INTRAVENOUS
  Filled 2020-07-05: qty 4000

## 2020-07-05 NOTE — H&P (Signed)
History and Physical    PLEASE NOTE THAT DRAGON DICTATION SOFTWARE WAS USED IN THE CONSTRUCTION OF THIS NOTE.   Jeffery Sweeney DGL:[id number] DOB: [birth year] DOA: 07/05/2020  PCP: Patient, No Pcp Per Patient coming from: home   I have personally briefly reviewed patient's old medical records in [name]  Chief Complaint: Chest pain  HPI: Jeffery Sweeney is a 60 y.o. male with medical history significant for coronary artery disease status post 3 prior MIs status post PCI with stent placement x3, hypertension, chronic tobacco abuse, who is admitted to [facility] on 07/05/2020 with suspected unstable angina after presenting from home to [facility] ED complaining of chest pain.   The patient reports acute onset of crushing substernal chest pressure earlier this evening that started at rest and was associated with diaphoresis.  The patient rated the intensity of his pain is 10 out of 10, and reported that by location and based upon quality, the presenting chest pain was very similar to that which he experienced at the time of his prior myocardial infarction's.  Denies any radiation, and also denies any associated shortness of breath, nausea, vomiting, palpitations, dizziness, presyncope, or syncope.  He reports that he did not ambulate following the onset of this chest pain, as he is unsure if there is an exacerbating component with exertion.  Reports that the chest pain was nonpositional, nonpleuritic, nonreproducible with direct outpatient in the anterior chest wall.  In the setting of ongoing chest pain, EMS was contacted, and administered 2 sublingual nitroglycerin in route to [facility] ED, in addition to administration of full dose aspirin.  Following the second nitroglycerin, the patient's chest pain completely resolved before subsequently returning in the emergency department.  At that point, nitroglycerin drip was initiated following which chest pain resolved again, without  subsequent recurrence.  The patient notes that he experienced 2 similar episodes of chest pain yesterday, both of which occurred at rest and without any ensuing attempts to ambulate.  He notes differentiation of tonight's visit chest pain from these 2 prior episodes and that yesterday's episodes resolve spontaneously within a few minutes, in the absence of any interval nitroglycerin.  Additionally, he reports that this evening his chest pain was of significantly worse intensity relative to the 2 self-limited episodes of chest pain that he experienced yesterday.  Over the preceding 6 months, the patient reports very rarely experiencing any degree of chest discomfort, and noted associated low intensity discomfort with this event.  Consequently, he notes a significant change in experiencing these 3 episodes of chest pain in close succession, with associated increase in severity of the discomfort.   Associated cough, hemoptysis, calf tenderness, lower extremity erythema.  No personal or family history of DVT/PE.  Denies any recent trauma, travel, surgery, extended periods of diminished ambulatory activity, or any personal history of malignancy.  New onset peripheral edema.  Denies any associated subjective fever, chills, rigors, or generalized myalgias.  No recent known COVID-19 contacts.  The patient reports that he has a history of 3 prior myocardial infarctions dating between 2017-19, with management of each occurring in the state of [place].  Over that timeframe, he reports that he underwent PCI with single stent placement on each of these 3 occasions.  He acknowledges that he has subsequently been prescribed a daily baby aspirin as well as Lopressor, but that he stopped taking both these medications over the last 3 to 4 months in the setting of various life stressors that prevented  him from obtaining refills of such.  As such, he reports that he has not been on any antiplatelet medication over the course  of the last 3 to 4 months.  He denies any recent headache, neck stiffness, rhinitis, rhinorrhea, sore throat, wheezing, diarrhea, or rash.     ED Course:  Vital signs in the ED were notable for the following: Tetramex 98.1; heart rate 86-1 01; blood pressure initially noted to be 158/121, which is decreased to 123/79 following initiation of nitroglycerin drip; respiratory rate 15-19, oxygen saturation 96 to 100% on room air.  Labs were notable for the following: CMP was notable for the following: Sodium 139, potassium 3.8, bicarbonate 22, creatinine 0.89.  BNP 17, high-sensitivity troponin high was initially found to be 25, which is relative to most recent prior value of 3 when checked on 06/28/2020.  CBC notable for white blood cell count of 8300, hemoglobin 15.  Nasopharyngeal COVID-19 PCR performed in the ED this evening, and found to be negative.  EKG, by way of comparison to most recent prior from 06/29/2020, showed sinus tachycardia with heart rate 105, nonspecific T wave inversion in aVL, less than 1 mm ST depression in V2, V3, V4, V5, which appears new relative to most recent prior EKG.  The patient's case was discussed with the on-call cardiologist, Dr. Mariah MillingGollan of [facility] cardiology, who feels that presentation is concerning for unstable angina. Dr. Mariah MillingGollan to formally consult, with plan to see the patient in the morning.  In the meantime, Dr.Gollan sked that the patient be kept n.p.o., with continuation of the nitroglycerin drip as well as initiation of heparin drip , and trending of serial troponin in the setting of concern for presenting unstable angina.  While in the ED, the following were administered: Nitroglycerin drip was initiated.  Subsequently, the patient was admitted to the PCU for further evaluation and management of suspected unstable angina.     Review of Systems: As per HPI otherwise 10 point review of systems negative.   Past Medical History:  Diagnosis Date  .  Hypertension   . Myocardial infarct (HCC)    x 3; PCI with single stent placement for each (in [place])    Past Surgical History:  Procedure Laterality Date  . CORONARY ANGIOPLASTY WITH STENT PLACEMENT     in [place] in 2018-19  . WRIST SURGERY Left    for cyst    Social History:  reports that he has been smoking cigarettes. He has been smoking about 0.50 packs per day. He has never used smokeless tobacco. He reports previous alcohol use. He reports previous drug use.   No Known Allergies  History reviewed. No pertinent family history.    Prior to Admission medications   Medication Sig Start Date End Date Taking? Authorizing Provider  meloxicam (MOBIC) 15 MG tablet Take 1 tablet (15 mg total) by mouth daily. 02/24/20 02/23/21 Yes Fisher, Roselyn BeringSusan W, PA-C  metoprolol tartrate (LOPRESSOR) 50 MG tablet Take 50 mg by mouth 2 (two) times daily.   Yes [provider]     Objective    Physical Exam: Vitals:   07/05/20 2033 07/05/20 2115 07/05/20 2130 07/05/20 2200  BP: (!) 158/121 136/89 (!) 165/106 123/79  Pulse: (!) 101 88 86 (!) 101  Resp: 17 15 19    Temp: 98.1 F (36.7 C)     TempSrc: Oral     SpO2: 98% 98% 100% 96%  Weight:      Height:  General: appears to be stated age; alert, oriented Skin: warm, dry, no rash Head:  AT/[place] Mouth:  Oral mucosa membranes appear moist, normal dentition Neck: supple; trachea midline Heart:  RRR; did not appreciate any M/R/G Lungs: CTAB, did not appreciate any wheezes, rales, or rhonchi Abdomen: + BS; soft, ND, NT Vascular: 2+ pedal pulses b/l; 2+ radial pulses b/l Extremities: no peripheral edema, no muscle wasting Neuro: strength and sensation intact in upper and lower extremities b/l    Labs on Admission: I have personally reviewed following labs and imaging studies  CBC: Recent Labs  Lab 07/05/20 2037  WBC 8.3  NEUTROABS 4.1  HGB 15.1  HCT 44.4  MCV 87.2  PLT 287   Basic Metabolic  Panel: Recent Labs  Lab 07/05/20 2037  NA 139  K 3.8  CL 108  CO2 22  GLUCOSE 113*  BUN 18  CREATININE 0.89  CALCIUM 10.3   GFR: Estimated Creatinine Clearance: 86.5 mL/min (by C-G formula based on SCr of 0.89 mg/dL). Liver Function Tests: Recent Labs  Lab 07/05/20 2037  AST 18  ALT 16  ALKPHOS 82  BILITOT 1.0  PROT 7.6  ALBUMIN 4.1   No results for input(s): LIPASE, AMYLASE in the last 168 hours. No results for input(s): AMMONIA in the last 168 hours. Coagulation Profile: Recent Labs  Lab 07/05/20 2039  INR 1.0   Cardiac Enzymes: No results for input(s): CKTOTAL, CKMB, CKMBINDEX, TROPONINI in the last 168 hours. BNP (last 3 results) No results for input(s): PROBNP in the last 8760 hours. HbA1C: No results for input(s): HGBA1C in the last 72 hours. CBG: No results for input(s): GLUCAP in the last 168 hours. Lipid Profile: No results for input(s): CHOL, HDL, LDLCALC, TRIG, CHOLHDL, LDLDIRECT in the last 72 hours. Thyroid Function Tests: No results for input(s): TSH, T4TOTAL, FREET4, T3FREE, THYROIDAB in the last 72 hours. Anemia Panel: No results for input(s): VITAMINB12, FOLATE, FERRITIN, TIBC, IRON, RETICCTPCT in the last 72 hours. Urine analysis:    Component Value Date/Time   COLORURINE YELLOW (A) 06/28/2020 1517   APPEARANCEUR CLEAR (A) 06/28/2020 1517   LABSPEC 1.027 06/28/2020 1517   PHURINE 5.0 06/28/2020 1517   GLUCOSEU NEGATIVE 06/28/2020 1517   HGBUR NEGATIVE 06/28/2020 1517   BILIRUBINUR NEGATIVE 06/28/2020 1517   KETONESUR NEGATIVE 06/28/2020 1517   PROTEINUR NEGATIVE 06/28/2020 1517   NITRITE NEGATIVE 06/28/2020 1517   LEUKOCYTESUR NEGATIVE 06/28/2020 1517    Radiological Exams on Admission: DG Chest Port 1 View  Result Date: 07/05/2020 CLINICAL DATA:  Chest pain EXAM: PORTABLE CHEST 1 VIEW COMPARISON:  12/29/2019 FINDINGS: The heart size and mediastinal contours are within normal limits. Both lungs are clear. The visualized skeletal  structures are unremarkable. IMPRESSION: No active disease. Electronically Signed   By: Jasmine Pang M.D.   On: 07/05/2020 20:47     EKG: Independently reviewed, with result as described above.    Assessment/Plan    Principal Problem:   Unstable angina (HCC) Active Problems:   Chest pain   Hypertension   Tobacco abuse     #) Unstable angina: In the context of a history of coronary artery disease status post prior PCI with single stent placement on 3 different occasions between 2017-2019, the patient presents with crushing substernal pressure that has totally resolved with nitroglycerin, following 2 episodes of chest pain on the day prior that were much less intense and resolved spontaneously, relative to very infrequent episodes of mild chest discomfort over the preceding 6 months.  Initial troponin mildly elevated at 25, with anticipation that this will subsequently increase the patient presented to the emergency department and had first troponin drawn shortly after onset of today's episode of chest pain.  Additionally, presenting EKG shows new less than 1 mm ST depression in leads V2 through V5 relative to most recent prior EKG from 06/29/2020, but demonstrates no evidence of associated ST elevation.  Chest x-ray shows no evidence of acute cardiopulmonary process.   The patient's case was discussed with the on-call cardiologist, Dr. Mariah Milling of [facility] cardiology, who feels that presentation is concerning for unstable angina. Dr. Mariah Milling to formally consult, with plan to see the patient in the morning.  In the meantime, Dr.Gollan sked that the patient be kept n.p.o., with continuation of the nitroglycerin drip as well as initiation of heparin drip , and trending of serial troponin in the setting of concern for presenting unstable angina.  Of note, following initiation of nitroglycerin drip in the ED, the patient's chest pain has completely resolved, without any subsequent recurrence.  Additionally,  he received a full dose aspirin ministered by EMS in route to the ED.    Plan: trend serial troponin.  Start heparin drip, per cardiology recommendation.  Continue nitroglycerin drip.  Atorvastatin 80 mg p.o. x1 now for plaque stabilization qualities.  We will start Lopressor in order to decrease cardiac due to demand, but at a lower dose relative to that which the patient has recently been prescribed, as the patient knowledges that he has not been taking this medication over the course the last 3 to 4 months.  Specifically, will start Lopressor 12.5 mg p.o. twice daily.  Monitor on telemetry.  As needed EKG for any subsequent episodes of chest pain.  As needed sublingual nitroglycerin.  Add on serum magnesium level, with as needed supplementation to maintain magnesium level greater than or equal to 2.0 in order to reduce risk for ventricular arrhythmia.  Repeat serum magnesium level and CMP in the morning.  Also repeat CBC in the morning.  Cardiology formally consulted, with plan to evaluate the patient in the morning, as above.  N.p.o., per cardiology recommendations.      #) Essential hypertension: The patient reports that he is prescribed Lopressor 50 mg p.o. twice daily, but acknowledges suboptimal compliance with this medication recently, noting that he has not taken any doses of this beta-blocker over the last 3 to 4 months.  Relative BB naivety , Lopressor dose so as to avoid bradycardia.  We will closely monitor ensuing blood pressure, particularly given plan for continuation nitroglycerin drip.   Plan: Lopressor 12.5 mg p.o. twice daily for decreased cardiac due to demand in the setting of concern for unstable angina.  Continue nitroglycerin drip, per cardiology recommendations.  Close monitoring of ensuing blood pressure via routine vital signs.      #) Chronic tobacco abuse: The patient acknowledges that he is a current smoker, smoking approximately half pack per day over the course of  the last 25 years.  Plan: Counseled the patient on the importance of complete smoking discontinuation, particularly in the setting of a known history of underlying coronary artery disease.     DVT prophylaxis: Heparin drip Code Status: Full code Family Communication: none Disposition Plan: Per Rounding Team Consults called: Patient's case was discussed with the on-call [facility] cardiologist, Dr. Mariah Milling, as further described above. Admission status: Inpatient; PCU.    Of note, this patient was added by me to the following Admit List/Treatment Team: armcadmits.  PLEASE NOTE THAT DRAGON DICTATION SOFTWARE WAS USED IN THE CONSTRUCTION OF THIS NOTE.   Angie Fava DO [facility] Pager [phone] From 12PM - 12AM  Otherwise, please contact night-coverage  [email or web address] Password [facility]   07/05/2020, 11:04 PM

## 2020-07-05 NOTE — ED Triage Notes (Signed)
Pt to ED via [facility] with complaint of substernal chest pain. Pain does not radiate elsewhere. EMS reports was diaphoretic upon their arrival. Pain rated 3/10 at this time following 324 of aspirin and 2 nitroglycerin administered by EMS en route to ED. HX of MI 4-5 years ago with three stents placed.

## 2020-07-05 NOTE — ED Provider Notes (Signed)
[facility] Emergency Department Provider Note   ____________________________________________   Event Date/Time   First MD Initiated Contact with Patient 07/05/20 2201     (approximate)  I have reviewed the triage vital signs and the nursing notes.   HISTORY  Chief Complaint Chest Pain    HPI Jeffery Sweeney is a 60 y.o. male with a stated past medical history of hypertension and three MIs with stent placement in [place] who presents for substernal chest pain that began approximately 2 hours prior to arrival and has been worsening since onset.  Patient describes a squeezing substernal chest pain that is 3/10 at the time of this interview but was 10/10 before EMS administered nitroglycerin.  EMS also administered 324 mg of aspirin in route.  Patient states that this is similar to previous chest pain that he has had before his MIs.  Patient endorses associated shortness of breath and diaphoresis.  Patient currently denies any vision changes, tinnitus, difficulty speaking, facial droop, sore throat, shortness of breath, abdominal pain, nausea/vomiting/diarrhea, dysuria, or weakness/numbness/paresthesias in any extremity         Past Medical History:  Diagnosis Date  . Hypertension   . 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 Unstable angina (HCC) 07/05/2020    Past Surgical History:  Procedure Laterality Date  . CORONARY ANGIOPLASTY WITH STENT PLACEMENT     in [place] in 2018-19  . WRIST SURGERY Left    for cyst    Prior to Admission medications   Medication Sig Start Date End Date Taking? Authorizing Provider  meloxicam (MOBIC) 15 MG tablet Take 1 tablet (15 mg total) by mouth daily. 02/24/20 02/23/21 Yes Fisher, Roselyn Bering, PA-C  metoprolol tartrate (LOPRESSOR) 50 MG tablet Take 50 mg by mouth 2 (two) times daily.   Yes [provider]    Allergies Patient has no known allergies.  History reviewed. No pertinent family history.  Social History Social History   Tobacco Use  . Smoking status: Current Every Day Smoker    Packs/day: 0.50    Types: Cigarettes  . Smokeless tobacco: Never Used  Vaping Use  . Vaping Use: Never used  Substance Use Topics  . Alcohol use: Not Currently  . Drug use: Not Currently    Review of Systems Constitutional: No fever/chills Eyes: No visual changes. ENT: No sore throat. Cardiovascular: Endorses chest pain. Respiratory: Endorses shortness of breath. Gastrointestinal: No abdominal pain.  No nausea, no vomiting.  No diarrhea. Genitourinary: Negative for dysuria. Musculoskeletal: Negative for acute arthralgias Skin: Negative for rash.  Endorses diaphoresis Neurological: Negative for headaches, weakness/numbness/paresthesias in any extremity Psychiatric: Negative for suicidal ideation/homicidal ideation   ____________________________________________   PHYSICAL EXAM:  VITAL SIGNS: ED Triage Vitals  Enc Vitals Group     BP 07/05/20 2033 (!) 158/121     Pulse Rate 07/05/20 2033 (!) 101     Resp 07/05/20 2033 17     Temp 07/05/20 2033 98.1 F (36.7 C)     Temp Source 07/05/20 2033 Oral     SpO2 07/05/20 2033 98 %     Weight 07/05/20 2033 194 lb (88 kg)     Height 07/05/20 2033 5\' 3"  (1.6 m)     Head Circumference --      Peak Flow --      Pain Score 07/05/20 2033  3     Pain Loc --      Pain Edu? --      Excl. in GC? --    Constitutional: Alert and oriented. Well appearing and in no acute distress. Eyes: Conjunctivae are normal. PERRL. Head: Atraumatic. Nose: No congestion/rhinnorhea. Mouth/Throat: Mucous membranes are moist. Neck: No stridor Cardiovascular: Grossly normal heart sounds.  Good peripheral circulation. Respiratory: Normal respiratory effort.  No retractions. Gastrointestinal: Soft and nontender. No distention. Musculoskeletal: No  obvious deformities Neurologic:  Normal speech and language. No gross focal neurologic deficits are appreciated. Skin:  Skin is warm and dry. No rash noted. Psychiatric: Mood and affect are normal. Speech and behavior are normal.  ____________________________________________   LABS (all labs ordered are listed, but only abnormal results are displayed)  Labs Reviewed  COMPREHENSIVE METABOLIC PANEL - Abnormal; Notable for the following components:      Result Value   Glucose, Bld 113 (*)    All other components within normal limits  TROPONIN I (HIGH SENSITIVITY) - Abnormal; Notable for the following components:   Troponin I (High Sensitivity) 25 (*)    All other components within normal limits  RESP PANEL BY RT-PCR (FLU A&B, COVID) ARPGX2  BRAIN NATRIURETIC PEPTIDE  CBC WITH DIFFERENTIAL/PLATELET  ETHANOL  MAGNESIUM  PROTIME-INR  APTT  TROPONIN I (HIGH SENSITIVITY)   ____________________________________________  EKG  ED ECG REPORT I, Merwyn Katos, the attending physician, personally viewed and interpreted this ECG.  Date: 07/05/2020 EKG Time: 2034 Rate: 105 Rhythm: Tachycardic sinus rhythm QRS Axis: normal Intervals: normal ST/T Wave abnormalities: normal Narrative Interpretation: no evidence of acute ischemia  ____________________________________________  RADIOLOGY  ED MD interpretation: One-view portable chest x-ray shows no evidence of acute abnormalities including no pneumonia, pneumothorax, or widened mediastinum  Official radiology report(s): DG Chest Port 1 View  Result Date: 07/05/2020 CLINICAL DATA:  Chest pain EXAM: PORTABLE CHEST 1 VIEW COMPARISON:  12/29/2019 FINDINGS: The heart size and mediastinal contours are within normal limits. Both lungs are clear. The visualized skeletal structures are unremarkable. IMPRESSION: No active disease. Electronically Signed   By: Jasmine Pang M.D.   On: 07/05/2020 20:47     ____________________________________________   PROCEDURES  Procedure(s) performed (including Critical Care):  .1-3 Lead EKG Interpretation Performed by: Merwyn Katos, MD Authorized by: Merwyn Katos, MD     Interpretation: abnormal     ECG rate:  100   ECG rate assessment: tachycardic     Rhythm: sinus tachycardia     Ectopy: none     Conduction: normal       ____________________________________________   INITIAL IMPRESSION / ASSESSMENT AND PLAN / ED COURSE  As part of my medical decision making, I reviewed the following data within the electronic MEDICAL RECORD NUMBER Nursing notes reviewed and incorporated, Labs reviewed, EKG interpreted, Old chart reviewed, Radiograph reviewed and Notes from prior ED visits reviewed and incorporated        Workup: ECG, CXR, CBC, BMP, Troponin Findings: ECG: No overt evidence of STEMI. No evidence of Brugadas sign, delta wave, epsilon wave, significantly prolonged QTc, or malignant arrhythmia HS Troponin: Negative x1 Other Labs unremarkable for emergent problems. CXR: Without PTX, PNA, or widened mediastinum Last Stress Test: 4 years prior to arrival Last Heart Catheterization: Prior to arrival HEART Score: 6  Given History, Exam, and Workup I have low suspicion for ACS, Pneumothorax, Pneumonia, Pulmonary Embolus, Tamponade, Aortic Dissection or other emergent problem as a cause for this presentation.   High  Risk Chest Pain Patient at increased risk for Major Adverse Cardiac Event (AMI, PCI, CABG, death) Interventions: ASA 324mg  Defer Heparin drip as patient pain free at this time,   Disposition: Admit for continued cardiac monitoring and trending of troponins as well as further evaluation for potential inpatient stress testing vs cardiac catheterization and coronary angiography.      ____________________________________________   FINAL CLINICAL IMPRESSION(S) / ED DIAGNOSES  Final diagnoses:  None     ED  Discharge Orders    None       Note:  This document was prepared using Dragon voice recognition software and may include unintentional dictation errors.   , MD 07/05/20 2252

## 2020-07-05 NOTE — ED Notes (Signed)
Pt ambulatory to toilet without difficulty. 

## 2020-07-05 NOTE — Progress Notes (Signed)
ANTICOAGULATION CONSULT NOTE - Initial Consult  Pharmacy Consult for Heparin  Indication: chest pain/ACS  No Known Allergies  Patient Measurements: Height: 5\' 3"  (160 cm) Weight: 88 kg (194 lb) IBW/kg (Calculated) : 56.9 Heparin Dosing Weight: 76.2 kg   Vital Signs: Temp: 98.1 F (36.7 C) (02/17 2033) Temp Source: Oral (02/17 2033) BP: 123/79 (02/17 2200) Pulse Rate: 101 (02/17 2200)  Labs: Recent Labs    07/05/20 2037  HGB 15.1  HCT 44.4  PLT 287  CREATININE 0.89  TROPONINIHS 25*    Estimated Creatinine Clearance: 86.5 mL/min (by C-G formula based on SCr of 0.89 mg/dL).   Medical History: Past Medical History:  Diagnosis Date  . Hypertension   . Myocardial infarct (HCC)    x 3; PCI with single stent placement for each (in 2038)    Medications:  (Not in a hospital admission)   Assessment: Pharmacy consulted to dose heparin in this 60 year old male admitted with ACS/NSTEMI.  CrCl = 86.5 ml/min No prior anticoag noted.   Goal of Therapy:  Heparin level 0.3-0.7 units/ml Monitor platelets by anticoagulation protocol: Yes   Plan:  Give 4000 units bolus x 1 Start heparin infusion at 900 units/hr Check anti-Xa level in 6 hours and daily while on heparin Continue to monitor H&H and platelets  Jeffery Sweeney D 07/05/2020,10:49 PM

## 2020-07-06 ENCOUNTER — Encounter
Admission: EM | Disposition: A | Discharge status: Other Acute Inpatient Hospital | Payer: Self-pay | Source: Home / Self Care | Attending: Internal Medicine

## 2020-07-06 ENCOUNTER — Inpatient Hospital Stay (HOSPITAL_COMMUNITY)
Admission: AD | Admit: 2020-07-06 | Discharge: 2020-07-14 | DRG: 236 | Disposition: A | Discharge status: Home / Self Care | Payer: Self-pay | Source: Other Acute Inpatient Hospital | Attending: Cardiothoracic Surgery | Admitting: Cardiothoracic Surgery

## 2020-07-06 ENCOUNTER — Other Ambulatory Visit: Payer: Self-pay | Admitting: *Deleted

## 2020-07-06 ENCOUNTER — Inpatient Hospital Stay (HOSPITAL_COMMUNITY)
Admit: 2020-07-06 | Discharge: 2020-07-06 | Disposition: A | Discharge status: Home / Self Care | Payer: Self-pay | Attending: Physician Assistant | Admitting: Physician Assistant

## 2020-07-06 ENCOUNTER — Encounter: Payer: Self-pay | Admitting: Internal Medicine

## 2020-07-06 DIAGNOSIS — Z720 Tobacco use: Secondary | ICD-10-CM | POA: Diagnosis present

## 2020-07-06 DIAGNOSIS — F17210 Nicotine dependence, cigarettes, uncomplicated: Secondary | ICD-10-CM | POA: Diagnosis present

## 2020-07-06 DIAGNOSIS — I252 Old myocardial infarction: Secondary | ICD-10-CM

## 2020-07-06 DIAGNOSIS — Z01818 Encounter for other preprocedural examination: Secondary | ICD-10-CM

## 2020-07-06 DIAGNOSIS — E8770 Fluid overload, unspecified: Secondary | ICD-10-CM | POA: Diagnosis not present

## 2020-07-06 DIAGNOSIS — Z825 Family history of asthma and other chronic lower respiratory diseases: Secondary | ICD-10-CM

## 2020-07-06 DIAGNOSIS — Z09 Encounter for follow-up examination after completed treatment for conditions other than malignant neoplasm: Secondary | ICD-10-CM

## 2020-07-06 DIAGNOSIS — I2510 Atherosclerotic heart disease of native coronary artery without angina pectoris: Secondary | ICD-10-CM

## 2020-07-06 DIAGNOSIS — D62 Acute posthemorrhagic anemia: Secondary | ICD-10-CM | POA: Diagnosis not present

## 2020-07-06 DIAGNOSIS — Z955 Presence of coronary angioplasty implant and graft: Secondary | ICD-10-CM

## 2020-07-06 DIAGNOSIS — Z951 Presence of aortocoronary bypass graft: Secondary | ICD-10-CM

## 2020-07-06 DIAGNOSIS — I25110 Atherosclerotic heart disease of native coronary artery with unstable angina pectoris: Secondary | ICD-10-CM | POA: Diagnosis present

## 2020-07-06 DIAGNOSIS — I200 Unstable angina: Secondary | ICD-10-CM

## 2020-07-06 DIAGNOSIS — I214 Non-ST elevation (NSTEMI) myocardial infarction: Principal | ICD-10-CM | POA: Diagnosis present

## 2020-07-06 DIAGNOSIS — R079 Chest pain, unspecified: Secondary | ICD-10-CM | POA: Diagnosis present

## 2020-07-06 DIAGNOSIS — J9811 Atelectasis: Secondary | ICD-10-CM | POA: Diagnosis not present

## 2020-07-06 DIAGNOSIS — Z79899 Other long term (current) drug therapy: Secondary | ICD-10-CM

## 2020-07-06 DIAGNOSIS — Z833 Family history of diabetes mellitus: Secondary | ICD-10-CM

## 2020-07-06 DIAGNOSIS — I10 Essential (primary) hypertension: Secondary | ICD-10-CM | POA: Diagnosis present

## 2020-07-06 DIAGNOSIS — E785 Hyperlipidemia, unspecified: Secondary | ICD-10-CM | POA: Diagnosis present

## 2020-07-06 DIAGNOSIS — Z9689 Presence of other specified functional implants: Secondary | ICD-10-CM

## 2020-07-06 HISTORY — PX: INTRAVASCULAR PRESSURE WIRE/FFR STUDY: CATH118243

## 2020-07-06 HISTORY — DX: Atherosclerotic heart disease of native coronary artery without angina pectoris: I25.10

## 2020-07-06 HISTORY — DX: Tobacco use: Z72.0

## 2020-07-06 HISTORY — DX: Non-ST elevation (NSTEMI) myocardial infarction: I21.4

## 2020-07-06 HISTORY — PX: LEFT HEART CATH AND CORONARY ANGIOGRAPHY: CATH118249

## 2020-07-06 HISTORY — PX: CORONARY PRESSURE/FFR STUDY: CATH118243

## 2020-07-06 LAB — CBC
HCT: 41.7 % (ref 39.0–52.0)
Hemoglobin: 13.9 g/dL (ref 13.0–17.0)
MCH: 29.4 pg (ref 26.0–34.0)
MCHC: 33.3 g/dL (ref 30.0–36.0)
MCV: 88.3 fL (ref 80.0–100.0)
Platelets: 238 10*3/uL (ref 150–400)
RBC: 4.72 MIL/uL (ref 4.22–5.81)
RDW: 13.2 % (ref 11.5–15.5)
WBC: 8.1 10*3/uL (ref 4.0–10.5)
nRBC: 0 % (ref 0.0–0.2)

## 2020-07-06 LAB — ECHOCARDIOGRAM COMPLETE
AR max vel: 1.98 cm2
AV Area VTI: 2.21 cm2
AV Area mean vel: 2.18 cm2
AV Mean grad: 3 mmHg
AV Peak grad: 4.9 mmHg
Ao pk vel: 1.11 m/s
Area-P 1/2: 5.06 cm2
Height: 63 in
S' Lateral: 2.8 cm
Weight: 2754.87 oz

## 2020-07-06 LAB — BASIC METABOLIC PANEL
Anion gap: 8 (ref 5–15)
BUN: 17 mg/dL (ref 6–20)
CO2: 26 mmol/L (ref 22–32)
Calcium: 9.8 mg/dL (ref 8.9–10.3)
Chloride: 106 mmol/L (ref 98–111)
Creatinine, Ser: 0.95 mg/dL (ref 0.61–1.24)
GFR, Estimated: >60 mL/min (ref >60)
Glucose, Bld: 94 mg/dL (ref 70–99)
Potassium: 4 mmol/L (ref 3.5–5.1)
Sodium: 140 mmol/L (ref 135–145)

## 2020-07-06 LAB — MAGNESIUM: Magnesium: 2.1 mg/dL (ref 1.7–2.4)

## 2020-07-06 LAB — TROPONIN I (HIGH SENSITIVITY): Troponin I (High Sensitivity): 969 ng/L (ref <18)

## 2020-07-06 LAB — HEPARIN LEVEL (UNFRACTIONATED): Heparin Unfractionated: 0.15 IU/mL — ABNORMAL LOW (ref 0.30–0.70)

## 2020-07-06 LAB — HIV ANTIBODY (ROUTINE TESTING W REFLEX): HIV Screen 4th Generation wRfx: NONREACTIVE

## 2020-07-06 SURGERY — LEFT HEART CATH AND CORONARY ANGIOGRAPHY
Anesthesia: Moderate Sedation

## 2020-07-06 MED ORDER — LIDOCAINE HCL (PF) 1 % IJ SOLN
INTRAMUSCULAR | Status: DC | PRN
  Administered 2020-07-06: 2 mL

## 2020-07-06 MED ORDER — ONDANSETRON HCL 4 MG/2ML IJ SOLN: 4.0000 mg | Freq: Four times a day (QID) | INTRAMUSCULAR | Status: DC | PRN

## 2020-07-06 MED ORDER — HEPARIN (PORCINE) IN NACL 1000-0.9 UT/500ML-% IV SOLN
INTRAVENOUS | Status: AC
  Filled 2020-07-06: qty 1000

## 2020-07-06 MED ORDER — NITROGLYCERIN 1 MG/10 ML FOR IR/CATH LAB
INTRA_ARTERIAL | Status: DC | PRN
  Administered 2020-07-06: 200 ug via INTRACORONARY

## 2020-07-06 MED ORDER — ASPIRIN 81 MG PO CHEW: 81.0000 mg | CHEWABLE_TABLET | ORAL | Status: AC

## 2020-07-06 MED ORDER — MIDAZOLAM HCL 2 MG/2ML IJ SOLN
INTRAMUSCULAR | Status: AC
  Filled 2020-07-06: qty 2

## 2020-07-06 MED ORDER — ATORVASTATIN CALCIUM 80 MG PO TABS
80.0000 mg | ORAL_TABLET | Freq: Every day | ORAL | Status: DC
  Administered 2020-07-06 – 2020-07-08 (×3): 80 mg via ORAL
  Filled 2020-07-06 (×3): qty 1

## 2020-07-06 MED ORDER — HEPARIN (PORCINE) IN NACL 1000-0.9 UT/500ML-% IV SOLN
INTRAVENOUS | Status: DC | PRN
  Administered 2020-07-06: 500 mL

## 2020-07-06 MED ORDER — IOHEXOL 300 MG/ML  SOLN
INTRAMUSCULAR | Status: DC | PRN
  Administered 2020-07-06: 90 mL

## 2020-07-06 MED ORDER — FENTANYL CITRATE (PF) 100 MCG/2ML IJ SOLN
INTRAMUSCULAR | Status: DC | PRN
  Administered 2020-07-06: 25 ug via INTRAVENOUS

## 2020-07-06 MED ORDER — ACETAMINOPHEN 325 MG PO TABS: 650.0000 mg | ORAL_TABLET | ORAL | Status: DC | PRN

## 2020-07-06 MED ORDER — ASPIRIN EC 81 MG PO TBEC
81.0000 mg | DELAYED_RELEASE_TABLET | Freq: Every day | ORAL | Status: DC
  Administered 2020-07-07 – 2020-07-08 (×2): 81 mg via ORAL
  Filled 2020-07-06 (×2): qty 1

## 2020-07-06 MED ORDER — VERAPAMIL HCL 2.5 MG/ML IV SOLN
INTRAVENOUS | Status: AC
  Filled 2020-07-06: qty 2

## 2020-07-06 MED ORDER — HEPARIN BOLUS VIA INFUSION
2300.0000 [IU] | Freq: Once | INTRAVENOUS | Status: AC
  Administered 2020-07-06: 2300 [IU] via INTRAVENOUS
  Filled 2020-07-06: qty 2300

## 2020-07-06 MED ORDER — SODIUM CHLORIDE 0.9 % IV SOLN: INTRAVENOUS | Status: DC

## 2020-07-06 MED ORDER — HEPARIN (PORCINE) 25000 UT/250ML-% IV SOLN: 1200.0000 [IU]/h | INTRAVENOUS | Status: DC

## 2020-07-06 MED ORDER — SODIUM CHLORIDE 0.9 % IV SOLN
INTRAVENOUS | Status: DC
  Administered 2020-07-06: 1000 mL via INTRAVENOUS

## 2020-07-06 MED ORDER — METOPROLOL TARTRATE 12.5 MG HALF TABLET
12.5000 mg | ORAL_TABLET | Freq: Two times a day (BID) | ORAL | Status: DC
  Administered 2020-07-06 – 2020-07-08 (×5): 12.5 mg via ORAL
  Filled 2020-07-06 (×5): qty 1

## 2020-07-06 MED ORDER — FENTANYL CITRATE (PF) 100 MCG/2ML IJ SOLN
INTRAMUSCULAR | Status: AC
  Filled 2020-07-06: qty 2

## 2020-07-06 MED ORDER — SODIUM CHLORIDE 0.9% FLUSH: 3.0000 mL | INTRAVENOUS | Status: DC | PRN

## 2020-07-06 MED ORDER — SODIUM CHLORIDE 0.9% FLUSH: 3.0000 mL | Freq: Two times a day (BID) | INTRAVENOUS | Status: DC

## 2020-07-06 MED ORDER — SODIUM CHLORIDE 0.9 % IV SOLN: 250.0000 mL | INTRAVENOUS | Status: DC | PRN

## 2020-07-06 MED ORDER — ASPIRIN 81 MG PO CHEW: 324.0000 mg | CHEWABLE_TABLET | Freq: Every day | ORAL | Status: DC

## 2020-07-06 MED ORDER — HEPARIN SODIUM (PORCINE) 1000 UNIT/ML IJ SOLN
INTRAMUSCULAR | Status: DC | PRN
  Administered 2020-07-06: 4000 [IU] via INTRAVENOUS
  Administered 2020-07-06: 3000 [IU] via INTRAVENOUS

## 2020-07-06 MED ORDER — ASPIRIN 81 MG PO CHEW
CHEWABLE_TABLET | ORAL | Status: AC
  Administered 2020-07-06: 81 mg via ORAL
  Filled 2020-07-06: qty 1

## 2020-07-06 MED ORDER — HEPARIN (PORCINE) 25000 UT/250ML-% IV SOLN
1400.0000 [IU]/h | INTRAVENOUS | Status: DC
  Administered 2020-07-06: 19:00:00 1200 [IU]/h via INTRAVENOUS
  Administered 2020-07-07 – 2020-07-08 (×3): 1400 [IU]/h via INTRAVENOUS
  Filled 2020-07-06 (×5): qty 250

## 2020-07-06 MED ORDER — NITROGLYCERIN 0.4 MG SL SUBL: 0.4000 mg | SUBLINGUAL_TABLET | SUBLINGUAL | Status: DC | PRN

## 2020-07-06 MED ORDER — HEPARIN SODIUM (PORCINE) 1000 UNIT/ML IJ SOLN
INTRAMUSCULAR | Status: AC
  Filled 2020-07-06: qty 1

## 2020-07-06 MED ORDER — SODIUM CHLORIDE 0.9 % IV SOLN
INTRAVENOUS | Status: AC | PRN
  Administered 2020-07-06: 250 mL via INTRAVENOUS

## 2020-07-06 MED ORDER — VERAPAMIL HCL 2.5 MG/ML IV SOLN
INTRAVENOUS | Status: DC | PRN
  Administered 2020-07-06: 2.5 mg via INTRA_ARTERIAL

## 2020-07-06 SURGICAL SUPPLY — 10 items
CATH INFINITI 5FR JK (CATHETERS) ×1 IMPLANT
CATH LAUNCHER 6FR EBU3.5 (CATHETERS) ×1 IMPLANT
DEVICE RAD TR BAND REGULAR (VASCULAR PRODUCTS) ×1 IMPLANT
GLIDESHEATH SLEND SS 6F .021 (SHEATH) ×1 IMPLANT
GUIDEWIRE INQWIRE 1.5J.035X260 (WIRE) IMPLANT
GUIDEWIRE PRESS OMNI 185 ST (WIRE) ×1 IMPLANT
INQWIRE 1.5J .035X260CM (WIRE) ×2
KIT ENCORE 26 ADVANTAGE (KITS) ×1 IMPLANT
KIT MANI 3VAL PERCEP (MISCELLANEOUS) ×2 IMPLANT
PACK CARDIAC CATH (CUSTOM PROCEDURE TRAY) ×2 IMPLANT

## 2020-07-06 NOTE — Progress Notes (Signed)
*  PRELIMINARY RESULTS* Echocardiogram 2D Echocardiogram has been performed.  Cristela Blue 07/06/2020, 11:06 AM

## 2020-07-06 NOTE — H&P (Addendum)
Cardiology Admission History and Physical:   Patient ID: Jeffery Sweeney MRN: [id number]; DOB: 1960/12/24   Admission date: (Not on file)  PCP:  Patient, No Pcp Per   Jeffery Sweeney  Cardiologist:  Jeffery BearsMuhammad Arida, MD  Advanced Practice Provider:  No care team member to display Electrophysiologist:  None [id number]}    Chief Complaint:  Non-STEMI, Multivessel CAD  Patient Profile:   Jeffery Sweeney is a 60 y.o. male with a hx of self-reported MI x3 (~5 yr prior) with subsequent PCI, HTN, current tobacco use at approximately 2 cigarettes daily, and who is [facility] 07/06/2020 for non-STEMI showed multivessel CAD.  History of Present Illness:   Mr. Jeffery Sweeney is a 60 year old male with PMH as above.  He reports a previous history of MI x3 with PCI in [place] ~5 years ago.  He is a current smoker 2 cigarettes daily and reports smoking for at least 30-40 years.  He works in a kitchen and stays busy but does not have a regular workout routine. He denies having a cardiologist within [place] after moving to [place] in 2019.  He denies taking any of his cardiac medications.  On 07/04/2020, he woke with CP and SOB, similar to that before his previous MIs. CP was described as a pressure or "bear-hug" that only alleviated when breathing out very slowly. He has since continued to have episodes of CP.  He reported one episode of worsening chest pain while putting away dishes since that time.  His worst or longest episode occurred last night (07/05/2020), lasting hours. He tried Tums and antiacids with no relief. He decided to call 911. No associated nausea or emesis.  No signs or symptoms of bleeding.  No abdominal distention or lower extremity edema.   EMS arrived and administered nitro and ASA 324 mg in route.  He reported initial improvement of his CP with start of heparin and nitro but then had recurrent symptoms/CP. In [facility] ED at  [facility], vitals significant for BP 158/121, HR 101 bpm.  Labs significant for glucose 113, high-sensitivity troponin 25  76  969, renal function stable at Cr 0.95, BUN 17, H&H stable. EKG showed sinus tachycardia, 105 bpm, slight depression noted in lateral I, V5, V6 and anterior V2-V4 and not meeting criteria.  Chest x-ray without active disease.   At the time of cardiology consultation 2/18, he reported ongoing CP and chest soreness.  He also reported left leg pain with walking, starting at the hip with diminished left femoral pulse on exam. Same day 2/18 LHC performed as copied and pasted below and showed significant three-vessel CAD.  LAD disease appeared moderate by angiography.  It was diffusely diseased in the proximal mid segment and highly significant by FFR with IFR ratio of 0.74.  Also noted was severe stenosis in the bifurcating first diagonal, second diagonal, and RCA.  Left circumflex was also significantly diseased in the mid to distal segment.  OM branches were noted to be small and likely not graftable.  LVSF and LVEDP normal.  Subsequent echo showed EF 55 to 60%, NR WMA, mild LVH, GLS -15.2%.  Recommendation was [facility].   Past Medical History:  Diagnosis Date  . Hypertension   . Myocardial infarct (HCC)    x 3; PCI with single stent placement for each (in [place])  . Tobacco use    2 cigarettes daily  Past Surgical History:  Procedure Laterality Date  . CORONARY ANGIOPLASTY WITH STENT PLACEMENT     in [place] in 2018-19  . WRIST SURGERY Left    for cyst     Medications Prior to Admission: Prior to Admission medications   Medication Sig Start Date End Date Taking? Authorizing Provider  meloxicam (MOBIC) 15 MG tablet Take 1 tablet (15 mg total) by mouth daily. 02/24/20 02/23/21  Fisher, Roselyn Bering, PA-C  metoprolol tartrate (LOPRESSOR) 50 MG tablet Take 50 mg by mouth 2 (two) times daily.    [provider]      Allergies:   No Known Allergies  Social History:   Social History   Socioeconomic History  . Marital status: Divorced    Spouse name: Not on file  . Number of children: Not on file  . Years of education: Not on file  . Highest education level: Not on file  Occupational History  . Not on file  Tobacco Use  . Smoking status: Current Every Day Smoker    Packs/day: 0.50    Types: Cigarettes  . Smokeless tobacco: Never Used  Vaping Use  . Vaping Use: Never used  Substance and Sexual Activity  . Alcohol use: Not Currently  . Drug use: Not Currently  . Sexual activity: Not on file  Other Topics Concern  . Not on file  Social History Narrative  . Not on file   Social Determinants of Health   Financial Resource Strain: Not on file  Food Insecurity: Not on file  Transportation Needs: Not on file  Physical Activity: Not on file  Stress: Not on file  Social Connections: Not on file  Intimate Partner Violence: Not on file    Family History:   The patient's family history is not on file.   No known family history of early cardiac death or heart disease. ROS:  Please see the history of present illness.  All other ROS reviewed and negative.     Physical Exam/Data:  There were no vitals filed for this visit. No intake or output data in the 24 hours ending 07/06/20 1501 Last 3 Weights 07/06/2020 07/05/2020 07/05/2020  Weight (lbs) 172 lb 2.9 oz 173 lb 3.2 oz 194 lb  Weight (kg) 78.1 kg 78.563 kg 87.998 kg     There is no height or weight on file to calculate BMI.  General:  Well nourished, well developed, in no acute distress. Just woke from sleep. HEENT: normal Lymph: no adenopathy Neck: no JVD Endocrine:  No thryomegaly Vascular: No carotid bruits; FA pulses 2+ bilaterally without bruits  Cardiac:  normal S1, S2; RRR; no murmur  Lungs:  clear to auscultation bilaterally, no wheezing, rhonchi or rales  Abd: soft, nontender, no hepatomegaly  Ext: no  edema Musculoskeletal:  No deformities, BUE and BLE strength normal and equal Skin: warm and dry  Neuro:  CNs 2-12 intact, no focal abnormalities noted Psych:  Normal affect    EKG:  The ECG that was done 07/05/20 was personally reviewed and demonstrates  sinus tachycardia, 105 bpm, minimal / slight depression noted in lateral I, V5, V6 and anterior V2-V4 but not significant enough to meet criteria.  Relevant CV Studies: Echo 07/06/20 1. Left ventricular ejection fraction, by estimation, is 55 to 60%. The  left ventricle has normal function. The left ventricle has no regional  wall motion abnormalities. There is mild left ventricular hypertrophy.  Left ventricular diastolic parameters  were normal. The average left ventricular global  longitudinal strain is  -15.2 %. The global longitudinal strain is abnormal.  2. Right ventricular systolic function is normal. The right ventricular  size is normal.  3. The mitral valve is normal in structure. No evidence of mitral valve  regurgitation. No evidence of mitral stenosis.  4. The aortic valve is normal in structure. Aortic valve regurgitation is  not visualized. No aortic stenosis is present.  5. The inferior vena cava is normal in size with greater than 50%  respiratory variability, suggesting right atrial pressure of 3 mmHg.   LHC 07/06/2020  The left ventricular systolic function is normal.  LV end diastolic pressure is normal.  The left ventricular ejection fraction is 55-65% by visual estimate.  Prox RCA lesion is 80% stenosed.  Dist RCA lesion is 30% stenosed.  RPDA lesion is 90% stenosed.  Prox LAD to Mid LAD lesion is 60% stenosed.  1st Diag lesion is 90% stenosed.  Lat 1st Diag lesion is 90% stenosed.  Mid Cx to Dist Cx lesion is 90% stenosed.  2nd Diag lesion is 80% stenosed.  2nd Mrg lesion is 90% stenosed. 1.  Significant three-vessel coronary artery disease.  The LAD disease appears moderate  angiographically.  However, it is diffusely diseased in the proximal mid segment and was highly significant by fractional flow reserve evaluation with an IFR ratio of 0.74.  In addition, there is severe stenosis in the bifurcating first diagonal, second diagonal and right coronary artery.  The left circumflex is also significantly diseased in the mid to distal segment but OM branches are small and likely not graftable. 2.  Normal LV systolic function and normal left ventricular end-diastolic pressure.  Recommendations: I recommend transfer to [facility] for CABG evaluation. Resume heparin drip 6 hours after sheath pull.  Laboratory Data:  High Sensitivity Troponin:   Recent Labs  Lab 06/28/20 1517 06/28/20 1748 07/05/20 2037 07/05/20 2236 07/06/20 0608  TROPONINIHS 3 3 25* 76* 969*      Chemistry Recent Labs  Lab 07/05/20 2037 07/06/20 0423  NA 139 140  K 3.8 4.0  CL 108 106  CO2 22 26  GLUCOSE 113* 94  BUN 18 17  CREATININE 0.89 0.95  CALCIUM 10.3 9.8  GFRNONAA >60 >60  ANIONGAP 9 8    Recent Labs  Lab 07/05/20 2037  PROT 7.6  ALBUMIN 4.1  AST 18  ALT 16  ALKPHOS 82  BILITOT 1.0   Hematology Recent Labs  Lab 07/05/20 2037 07/06/20 0423  WBC 8.3 8.1  RBC 5.09 4.72  HGB 15.1 13.9  HCT 44.4 41.7  MCV 87.2 88.3  MCH 29.7 29.4  MCHC 34.0 33.3  RDW 12.9 13.2  PLT 287 238   BNP Recent Labs  Lab 07/05/20 2037  BNP 17.3    DDimer No results for input(s): DDIMER in the last 168 hours.   Radiology/Studies:  CARDIAC CATHETERIZATION  Result Date: 07/06/2020  The left ventricular systolic function is normal.  LV end diastolic pressure is normal.  The left ventricular ejection fraction is 55-65% by visual estimate.  Prox RCA lesion is 80% stenosed.  Dist RCA lesion is 30% stenosed.  RPDA lesion is 90% stenosed.  Prox LAD to Mid LAD lesion is 60% stenosed.  1st Diag lesion is 90% stenosed.  Lat 1st Diag lesion is 90% stenosed.  Mid Cx to  Dist Cx lesion is 90% stenosed.  2nd Diag lesion is 80% stenosed.  2nd Mrg lesion is 90% stenosed.  1.  Significant  three-vessel coronary artery disease.  The LAD disease appears moderate angiographically.  However, it is diffusely diseased in the proximal mid segment and was highly significant by fractional flow reserve evaluation with an IFR ratio of 0.74.  In addition, there is severe stenosis in the bifurcating first diagonal, second diagonal and right coronary artery.  The left circumflex is also significantly diseased in the mid to distal segment but OM branches are small and likely not graftable. 2.  Normal LV systolic function and normal left ventricular end-diastolic pressure. Recommendations: I recommend transfer to [facility] for CABG evaluation. Resume heparin drip 6 hours after sheath pull.   DG Chest Port 1 View  Result Date: 07/05/2020 CLINICAL DATA:  Chest pain EXAM: PORTABLE CHEST 1 VIEW COMPARISON:  12/29/2019 FINDINGS: The heart size and mediastinal contours are within normal limits. Both lungs are clear. The visualized skeletal structures are unremarkable. IMPRESSION: No active disease. Electronically Signed   By: Jasmine Pang M.D.   On: 07/05/2020 20:47   ECHOCARDIOGRAM COMPLETE  Result Date: 07/06/2020    ECHOCARDIOGRAM REPORT   Patient Name:   Jeffery Sweeney Date of Exam: 07/06/2020 Medical Rec #:  [id number]        Height:       63.0 in Accession #:    [id number]       Weight:       172.2 lb Date of Birth:  05-05-1961        BSA:          1.814 m Patient Age:    59 years         BP:           112/55 mmHg Patient Gender: M                HR:           74 bpm. Exam Location:  [facility] Procedure: 2D Echo, Cardiac Doppler, Color Doppler and Strain Analysis Indications:     NSTEMI I21.4  History:         Patient has no prior history of Echocardiogram examinations.                  Previous Myocardial Infarction; Risk Factors:Hypertension.  Sonographer:     Cristela Blue RDCS (AE)  Referring Phys:  [id number] Lennon Alstrom Diagnosing Phys: Jeffery Bears MD  Sonographer Comments: Global longitudinal strain was attempted. IMPRESSIONS  1. Left ventricular ejection fraction, by estimation, is 55 to 60%. The left ventricle has normal function. The left ventricle has no regional wall motion abnormalities. There is mild left ventricular hypertrophy. Left ventricular diastolic parameters were normal. The average left ventricular global longitudinal strain is -15.2 %. The global longitudinal strain is abnormal.  2. Right ventricular systolic function is normal. The right ventricular size is normal.  3. The mitral valve is normal in structure. No evidence of mitral valve regurgitation. No evidence of mitral stenosis.  4. The aortic valve is normal in structure. Aortic valve regurgitation is not visualized. No aortic stenosis is present.  5. The inferior vena cava is normal in size with greater than 50% respiratory variability, suggesting right atrial pressure of 3 mmHg. FINDINGS  Left Ventricle: Left ventricular ejection fraction, by estimation, is 55 to 60%. The left ventricle has normal function. The left ventricle has no regional wall motion abnormalities. The average left ventricular global longitudinal strain is -15.2 %. The global longitudinal strain is abnormal. The left ventricular internal cavity size was  normal in size. There is mild left ventricular hypertrophy. Left ventricular diastolic parameters were normal. Right Ventricle: The right ventricular size is normal. No increase in right ventricular wall thickness. Right ventricular systolic function is normal. Left Atrium: Left atrial size was normal in size. Right Atrium: Right atrial size was normal in size. Pericardium: There is no evidence of pericardial effusion. Mitral Valve: The mitral valve is normal in structure. No evidence of mitral valve regurgitation. No evidence of mitral valve stenosis. Tricuspid Valve: The tricuspid valve  is normal in structure. Tricuspid valve regurgitation is not demonstrated. No evidence of tricuspid stenosis. Aortic Valve: The aortic valve is normal in structure. Aortic valve regurgitation is not visualized. No aortic stenosis is present. Aortic valve mean gradient measures 3.0 mmHg. Aortic valve peak gradient measures 4.9 mmHg. Aortic valve area, by VTI measures 2.21 cm. Pulmonic Valve: The pulmonic valve was normal in structure. Pulmonic valve regurgitation is not visualized. No evidence of pulmonic stenosis. Aorta: The aortic root is normal in size and structure. Venous: The inferior vena cava is normal in size with greater than 50% respiratory variability, suggesting right atrial pressure of 3 mmHg. IAS/Shunts: No atrial level shunt detected by color flow Doppler.  LEFT VENTRICLE PLAX 2D LVIDd:         3.97 cm  Diastology LVIDs:         2.80 cm  LV e' medial:    6.09 cm/s LV PW:         1.27 cm  LV E/e' medial:  10.3 LV IVS:        0.97 cm  LV e' lateral:   8.16 cm/s LVOT diam:     2.10 cm  LV E/e' lateral: 7.7 LV SV:         50 LV SV Index:   28       2D Longitudinal Strain LVOT Area:     3.46 cm 2D Strain GLS Avg:     -15.2 %                          3D Volume EF:                         3D EF:        52 %                         LV EDV:       110 ml                         LV ESV:       52 ml                         LV SV:        57 ml RIGHT VENTRICLE RV Basal diam:  2.12 cm RV S prime:     9.90 cm/s TAPSE (M-mode): 3.7 cm LEFT ATRIUM             Index       RIGHT ATRIUM           Index LA diam:        2.70 cm 1.49 cm/m  RA Area:     11.70 cm LA Vol (A2C):   50.5 ml 27.83 ml/m RA Volume:   23.60 ml  13.01  ml/m LA Vol (A4C):   60.8 ml 33.51 ml/m LA Biplane Vol: 57.1 ml 31.47 ml/m  AORTIC VALVE                   PULMONIC VALVE AV Area (Vmax):    1.98 cm    PV Vmax:        0.27 m/s AV Area (Vmean):   2.18 cm    PV Peak grad:   0.3 mmHg AV Area (VTI):     2.21 cm    RVOT Peak grad: 1 mmHg AV Vmax:            110.50 cm/s AV Vmean:          81.750 cm/s AV VTI:            0.227 m AV Peak Grad:      4.9 mmHg AV Mean Grad:      3.0 mmHg LVOT Vmax:         63.30 cm/s LVOT Vmean:        51.400 cm/s LVOT VTI:          0.145 m LVOT/AV VTI ratio: 0.64  AORTA Ao Root diam: 3.15 cm MITRAL VALVE               TRICUSPID VALVE MV Area (PHT): 5.06 cm    TR Peak grad:   13.2 mmHg MV Decel Time: 150 msec    TR Vmax:        182.00 cm/s MV E velocity: 63.00 cm/s MV A velocity: 64.30 cm/s  SHUNTS MV E/A ratio:  0.98        Systemic VTI:  0.14 m                            Systemic Diam: 2.10 cm Jeffery Bears MD Electronically signed by Jeffery Bears MD Signature Date/Time: 07/06/2020/11:48:17 AM    Final      Assessment and Plan:   Non-STEMI 3V CAD by [facility] 07/06/20 History of self reported MI x3 with PCI --Presented to [facility] with CP and SOB x2 days, similar to that of his previous self reported MIs ~2017 as outlined in HPI.  Given ongoing CP during consultation, Tn elevation to 969, and RF for ischemia 2/2 CAD, LHC performed 2/18 as above and showing 3V CAD and recommendation for transfer to [facility]. Echo as copied above on 2/18 with nl EF and NRWMA.  --Plan is for transfer to [facility] for formal CABG evaluation. Pt agreeable to this plan. Sweeney team at [facility] contacted and aware of transfer. Dr. Rennis Golden is attending MD at [facility] and Dr. Tyrone Sage of [facility] contacted and aware of transfer. [facility] contacted for transport to a telemetry bed. EMTALA form and discharge summary completed by internal medicine at [facility]. Orders for transfer and MC completed by [facility] and pended under [facility] admission orders tab.  Daily BMET.   2/18 Cr 0.95 with BUN 17.  Daily CBC.   2/18 Hgb 13.9, HCT 41.7. ? Cycle Tn until peaked, down-trending.  ? Serial EKGs. ? Restart IV heparin 6 hours after sheath pull. ? Heart healthy diet ordered post cath 2/18.  ? NPO after midnight in preparation for CABG. ? Continue medications ASA 81mg  qd, metoprolol  12.5mg  BID, PRN SL nitro for CP, and high intensity atorvastatin 80mg  qd for risk factor modification. Continue IV nitro for CP as BP allows.   HTN  --Continue current metoprolol.  Current tobacco use --Smokes 2 cigarettes daily, which he reports is decreased from his previous amount.  Smoking cessation advised.   Risk Assessment/Risk Scores:     TIMI Risk Score for Unstable Angina or Non-ST Elevation MI:   The patient's TIMI risk score is 5, which indicates a 26% risk of all cause mortality, new or recurrent myocardial infarction or need for urgent revascularization in the next 14 days.       Severity of Illness: The appropriate patient status for this patient is INPATIENT. Inpatient status is judged to be reasonable and necessary in order to provide the required intensity of service to ensure the patient's safety. The patient's presenting symptoms, physical exam findings, and initial radiographic and laboratory data in the context of their chronic comorbidities is felt to place them at high risk for further clinical deterioration. Furthermore, it is not anticipated that the patient will be medically stable for discharge from the hospital within 2 midnights of admission. The following factors support the patient status of inpatient.   " The patient's presenting symptoms include angina. " The worrisome physical exam findings include NSTEMI, 3V CAD. " The initial radiographic and laboratory data are worrisome because of NSTEMI, 3V CAD. " The chronic co-morbidities include NSTEMI, 3V CAD.   * I certify that at the point of admission it is my clinical judgment that the patient will require inpatient hospital care spanning beyond 2 midnights from the point of admission due to high intensity of service, high risk for further deterioration and high frequency of surveillance required.*    For questions or updates, please contact [facility] Please consult [email or web address] for contact info  under     Signed, Lennon Alstrom, PA-C  07/06/2020 3:01 PM

## 2020-07-06 NOTE — Plan of Care (Signed)

## 2020-07-06 NOTE — Progress Notes (Signed)
Initial Nutrition Assessment  DOCUMENTATION CODES:   Obesity unspecified  INTERVENTION:   RD will add supplements once diet advanced if appropriate   NUTRITION DIAGNOSIS:   Inadequate oral intake related to acute illness as evidenced by NPO status.  GOAL:   Patient will meet greater than or equal to 90% of their needs  MONITOR:   Diet advancement,Labs,Weight trends,Skin,I & O's  REASON FOR ASSESSMENT:   Malnutrition Screening Tool    ASSESSMENT:   60 y.o. male with medical history significant for coronary artery disease status post 3 prior MIs status post PCI with stent placement x3, hypertension, chronic tobacco abuse, who is admitted to [facility] on 07/05/2020 with possible NSTEMI   Unable to see pt today as pt in procedure at time of RD visit. Per chart review, pt down 8lbs(4%) over the past 6 month; this is not significant. Pt currently NPO for cardiac cath today. RD will obtain nutrition related history and exam at follow up and add supplements if appropriate.   Medications reviewed and include: NaCl @75ml /hr, heparin  Labs reviewed:   NUTRITION - FOCUSED PHYSICAL EXAM: Unable to perform at this time   Diet Order:   Diet Order            Diet NPO time specified  Diet effective now                EDUCATION NEEDS:   No education needs have been identified at this time  Skin:  Skin Assessment: Reviewed RN Assessment  Last BM:  2/17  Height:   Ht Readings from Last 1 Encounters:  07/05/20 5\' 3"  (1.6 m)    Weight:   Wt Readings from Last 1 Encounters:  07/06/20 78.1 kg    Ideal Body Weight:  52.3 kg  BMI:  Body mass index is 30.5 kg/m.  Estimated Nutritional Needs:   Kcal:  1800-2100kcal/day  Protein:  90-105g/day  Fluid:  >1.8L/day  MS, RD, LDN Please refer to [facility] for RD and/or RD on-call/weekend/after hours pager

## 2020-07-06 NOTE — Progress Notes (Signed)
ANTICOAGULATION CONSULT NOTE   Pharmacy Consult for Heparin  Indication: S/P LHC for CABG eval   No Known Allergies  Patient Measurements:   Heparin Dosing Weight: 76.2 kg   Vital Signs: Temp: 98.1 F (36.7 C) (02/18 1548) Temp Source: Oral (02/18 1548) BP: 132/74 (02/18 1548) Pulse Rate: 85 (02/18 1548)  Labs: Recent Labs    07/05/20 2037 07/05/20 2039 07/05/20 2236 07/06/20 0423 07/06/20 0608  HGB 15.1  --   --  13.9  --   HCT 44.4  --   --  41.7  --   PLT 287  --   --  238  --   APTT  --  29  --   --   --   LABPROT  --  12.4  --   --   --   INR  --  1.0  --   --   --   HEPARINUNFRC  --   --   --   --  0.15*  CREATININE 0.89  --   --  0.95  --   TROPONINIHS 25*  --  76*  --  969*    Estimated Creatinine Clearance: 76.5 mL/min (by C-G formula based on SCr of 0.95 mg/dL).   Medical History: Past Medical History:  Diagnosis Date  . Hypertension   . Multiple vessel coronary artery disease   . Myocardial infarct (HCC)    x 3; PCI with single stent placement for each (in [place])  . NSTEMI (non-ST elevated myocardial infarction) [facility])    LHC 2/18 with multivessel CAD and transfer to [facility] for CABG eval  . Tobacco use    2 cigarettes daily    Medications:  Medications Prior to Admission  Medication Sig Dispense Refill Last Dose  . metoprolol tartrate (LOPRESSOR) 50 MG tablet Take 50 mg by mouth 2 (two) times daily.       Assessment: Pharmacy consulted to dose heparin in this 60 year old male admitted with ACS/NSTEMI. No prior anticoag noted. S/p cath 2/18. Found to have significant 3v disease for CABG eval. H/H and Plt wnl   Goal of Therapy:  Heparin level 0.3-0.7 units/ml Monitor platelets by anticoagulation protocol: Yes   Plan:  Start IV heparin at 1200 units/hr at 7:30 PM today. No bolus F/u 6 hr HL after heparin start Monitor daily HL, CBC and s/s of bleeding   Vinnie Level, PharmD., BCPS, BCCCP Clinical Pharmacist Please refer to [facility]  for unit-specific pharmacist

## 2020-07-06 NOTE — Progress Notes (Signed)
ANTICOAGULATION CONSULT NOTE   Pharmacy Consult for Heparin  Indication: chest pain/ACS  No Known Allergies  Patient Measurements: Height: 5\' 3"  (160 cm) Weight: 78.1 kg (172 lb 2.9 oz) IBW/kg (Calculated) : 56.9 Heparin Dosing Weight: 76.2 kg   Vital Signs: Temp: 98.2 F (36.8 C) (02/18 1111) Temp Source: Oral (02/18 1111) BP: 110/73 (02/18 1345) Pulse Rate: 70 (02/18 1345)  Labs: Recent Labs    07/05/20 2037 07/05/20 2039 07/05/20 2236 07/06/20 0423 07/06/20 0608  HGB 15.1  --   --  13.9  --   HCT 44.4  --   --  41.7  --   PLT 287  --   --  238  --   APTT  --  29  --   --   --   LABPROT  --  12.4  --   --   --   INR  --  1.0  --   --   --   HEPARINUNFRC  --   --   --   --  0.15*  CREATININE 0.89  --   --  0.95  --   TROPONINIHS 25*  --  76*  --  969*    Estimated Creatinine Clearance: 76.5 mL/min (by C-G formula based on SCr of 0.95 mg/dL).   Medical History: Past Medical History:  Diagnosis Date  . Hypertension   . Myocardial infarct (HCC)    x 3; PCI with single stent placement for each (in 07/08/20)    Medications:  Medications Prior to Admission  Medication Sig Dispense Refill Last Dose  . meloxicam (MOBIC) 15 MG tablet Take 1 tablet (15 mg total) by mouth daily. 30 tablet 2 Past Month at Unknown time  . metoprolol tartrate (LOPRESSOR) 50 MG tablet Take 50 mg by mouth 2 (two) times daily.   06/28/2020 at unknown    Assessment: Pharmacy consulted to dose heparin in this 60 year old male admitted with ACS/NSTEMI. No prior anticoag noted. S/p cath 2/18.   Goal of Therapy:  Heparin level 0.3-0.7 units/ml Monitor platelets by anticoagulation protocol: Yes   Plan:  Heparin to restart 6 hours post sheath removal. Check HL and CBC 6 hours post infusion start.   3/18 07/06/2020,1:48 PM

## 2020-07-06 NOTE — Interval H&P Note (Signed)
History and Physical Interval Note:  07/06/2020 12:46 PM  Jeffery Sweeney  has presented today for surgery, with the diagnosis of Non-ST elevation myocardial infarction.  The various methods of treatment have been discussed with the patient and family. After consideration of risks, benefits and other options for treatment, the patient has consented to  Procedure(s): LEFT HEART CATH AND CORONARY ANGIOGRAPHY (N/A) as a surgical intervention.  The patient's history has been reviewed, patient examined, no change in status, stable for surgery.  I have reviewed the patient's chart and labs.  Questions were answered to the patient's satisfaction.     Lorine Bears

## 2020-07-06 NOTE — H&P (View-Only) (Signed)
Cardiology Consultation:   Patient ID: [name] MRN: [id number]; DOB: [birth year]  Admit date: 07/05/2020 Date of Consult: 07/06/2020  PCP:  Patient, No Pcp Per   [facility]  Cardiologist: Previously [place].  New [facility], Dr. Kirke Corin Advanced Practice Provider:  No care team member to display Electrophysiologist:  None }    Patient Profile:   Jeffery Sweeney is a 61 y.o. male with a hx of self-reported MI x3 (~5 yr prior) with subsequent PCI, HTN, current tobacco use at approximately 2 cigarettes daily, who is being seen today for the evaluation of NSTEMI at the request of Dr. Sherryll Burger.  History of Present Illness:   Jeffery Sweeney is a 60 year old male with PMH as above.  Jeffery Sweeney reports a previous history of MI x3 with PCI in [place] ~5 years ago.  Jeffery Sweeney is a current smoker 2 cigarettes daily and reports smoking for at least 30-40 years.  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 Jeffery Sweeney denies having a cardiologist within [place].  Jeffery Sweeney reports that Jeffery Sweeney moved back to [place] in 2019 and after his mother passed away.  Jeffery Sweeney denies taking any cardiac medications at the time of her interview; however, on review of EMR, metoprolol is listed under home medications.  On 07/04/2020, Jeffery Sweeney reports that Jeffery Sweeney awoke with chest pain/pressure, diaphoresis, and shortness of breath.  This pressure was described as a bear-hug that only alleviated when breathing out very slowly.  Jeffery Sweeney states that the chest pressure and shortness of breath has continued since that time with his worst episode occurring last night (07/05/2020).  Each episode is described as occurring longer in length.  His episode last night was several hours.  Jeffery Sweeney reportedly tried Tums and antiacids with no relief.  The pain felt similar to that of his previous MIs, so when Jeffery Sweeney started to have worsening pain, Jeffery Sweeney decided to call 911.  Jeffery Sweeney denies any associated nausea or emesis.  No signs or  symptoms of bleeding.  Jeffery Sweeney denies any abdominal distention or lower extremity edema.    EMS arrived and administered nitro and ASA 324 mg in route.  Jeffery Sweeney denies any improvement in pain with nitro, though it is documented as offering him some relief while in the ED.  In the emergency department, vitals significant for BP 158/121, HR 101 bpm.  Labs significant for glucose 113, high-sensitivity troponin 25  76  969, renal function stable at Cr 0.95, BUN 17 with K4.0 and Mg 2.3. H&H stable with Hgb 13.9 and HCT 41.7 EKG showed sinus tachycardia, 105 bpm, minimal / slight depression noted in lateral I, V5, V6 and anterior V2-V4.  Chest x-ray without active disease.  Cardiology was consulted given his elevated troponin and chest pain with concern for non-STEMI.  Past Medical History:  Diagnosis Date  . Hypertension   . Myocardial infarct (HCC)    x 3; PCI with single stent placement for each (in [place])    Past Surgical History:  Procedure Laterality Date  . CORONARY ANGIOPLASTY WITH STENT PLACEMENT     in [place] in 2018-19  . WRIST SURGERY Left    for cyst     Home Medications:  Prior to Admission medications   Medication Sig Start Date End Date Taking? Authorizing Provider  meloxicam (MOBIC) 15 MG tablet Take 1 tablet (15 mg total) by mouth daily. 02/24/20 02/23/21 Yes Fisher, Roselyn Bering, PA-C  metoprolol tartrate (LOPRESSOR) 50 MG  tablet Take 50 mg by mouth 2 (two) times daily.   Yes [provider]    Inpatient Medications: Scheduled Meds: . influenza vac split quadrivalent PF  0.5 mL Intramuscular Tomorrow-1000  . metoprolol tartrate  12.5 mg Oral BID  . sodium chloride flush  3 mL Intravenous Q12H   Continuous Infusions: . heparin 1,200 Units/hr (07/06/20 0716)  . nitroGLYCERIN 20 mcg/min (07/06/20 0802)   PRN Meds: acetaminophen **OR** acetaminophen, ondansetron (ZOFRAN) IV  Allergies:   No Known Allergies  Social History:   Social History   Socioeconomic  History  . Marital status: Divorced    Spouse name: Not on file  . Number of children: Not on file  . Years of education: Not on file  . Highest education level: Not on file  Occupational History  . Not on file  Tobacco Use  . Smoking status: Current Every Day Smoker    Packs/day: 0.50    Types: Cigarettes  . Smokeless tobacco: Never Used  Vaping Use  . Vaping Use: Never used  Substance and Sexual Activity  . Alcohol use: Not Currently  . Drug use: Not Currently  . Sexual activity: Not on file  Other Topics Concern  . Not on file  Social History Narrative  . Not on file   Social Determinants of Health   Financial Resource Strain: Not on file  Food Insecurity: Not on file  Transportation Needs: Not on file  Physical Activity: Not on file  Stress: Not on file  Social Connections: Not on file  Intimate Partner Violence: Not on file    Family History:   History reviewed. No pertinent family history.   ROS:  Please see the history of present illness.  Review of Systems  Constitutional: Positive for diaphoresis and malaise/fatigue.  Respiratory: Positive for shortness of breath. Negative for cough and hemoptysis.   Cardiovascular: Positive for chest pain. Negative for palpitations, orthopnea and leg swelling.  Gastrointestinal: Negative for blood in stool, melena, nausea and vomiting.  Genitourinary: Negative for hematuria.  Musculoskeletal: Negative for falls.  Neurological: Negative for dizziness, focal weakness and loss of consciousness.  All other systems reviewed and are negative.   All other ROS reviewed and negative.     Physical Exam/Data:   Vitals:   07/05/20 2313 07/06/20 0426 07/06/20 0500 07/06/20 0719  BP: (!) 159/89 (!) 102/56  (!) 112/55  Pulse: 67 74  74  Resp: 16 20  18   Temp: 98 F (36.7 C) 98 F (36.7 C)  98.2 F (36.8 C)  TempSrc: Oral Oral  Oral  SpO2: 100% 94%  93%  Weight: 78.6 kg  78.1 kg   Height: 5\' 3"  (1.6 m)       Intake/Output  Summary (Last 24 hours) at 07/06/2020 0851 Last data filed at 07/06/2020 0802 Gross per 24 hour  Intake 138.23 ml  Output 0 ml  Net 138.23 ml   Last 3 Weights 07/06/2020 07/05/2020 07/05/2020  Weight (lbs) 172 lb 2.9 oz 173 lb 3.2 oz 194 lb  Weight (kg) 78.1 kg 78.563 kg 87.998 kg     Body mass index is 30.5 kg/m.  General:  Well nourished, well developed, in no acute distress. Just woke from sleep. HEENT: normal Lymph: no adenopathy Neck: no JVD Endocrine:  No thryomegaly Vascular: No carotid bruits; FA pulses 2+ bilaterally without bruits  Cardiac:  normal S1, S2; RRR; no murmur  Lungs:  clear to auscultation bilaterally, no wheezing, rhonchi or rales  Abd: soft,  nontender, no hepatomegaly  Ext: no edema Musculoskeletal:  No deformities, BUE and BLE strength normal and equal Skin: warm and dry  Neuro:  CNs 2-12 intact, no focal abnormalities noted Psych:  Normal affect   EKG:  The EKG was personally reviewed and demonstrates:   sinus tachycardia, 105 bpm, minimal / slight depression noted in lateral I, V5, V6 and anterior V2-V4. Telemetry:  Telemetry was personally reviewed and demonstrates:  NSR 70-90s  Relevant CV Studies: Pending echo  Laboratory Data:  High Sensitivity Troponin:   Recent Labs  Lab 06/28/20 1517 06/28/20 1748 07/05/20 2037 07/05/20 2236 07/06/20 0608  TROPONINIHS 3 3 25* 76* 969*     Chemistry Recent Labs  Lab 07/05/20 2037 07/06/20 0423  NA 139 140  K 3.8 4.0  CL 108 106  CO2 22 26  GLUCOSE 113* 94  BUN 18 17  CREATININE 0.89 0.95  CALCIUM 10.3 9.8  GFRNONAA >60 >60  ANIONGAP 9 8    Recent Labs  Lab 07/05/20 2037  PROT 7.6  ALBUMIN 4.1  AST 18  ALT 16  ALKPHOS 82  BILITOT 1.0   Hematology Recent Labs  Lab 07/05/20 2037 07/06/20 0423  WBC 8.3 8.1  RBC 5.09 4.72  HGB 15.1 13.9  HCT 44.4 41.7  MCV 87.2 88.3  MCH 29.7 29.4  MCHC 34.0 33.3  RDW 12.9 13.2  PLT 287 238   BNP Recent Labs  Lab 07/05/20 2037  BNP 17.3     DDimer No results for input(s): DDIMER in the last 168 hours.   Radiology/Studies:  DG Chest Port 1 View  Result Date: 07/05/2020 CLINICAL DATA:  Chest pain EXAM: PORTABLE CHEST 1 VIEW COMPARISON:  12/29/2019 FINDINGS: The heart size and mediastinal contours are within normal limits. Both lungs are clear. The visualized skeletal structures are unremarkable. IMPRESSION: No active disease. Electronically Signed   By: Jasmine Pang M.D.   On: 07/05/2020 20:47     Assessment and Plan:   NSTEMI Self-reported history of MI x3 with PCI --Reports current chest pain with episodes of SOB and CP x2 days.  Current CP is described as improved from the previous evening.  Describes CP is similar to that of his previous MIs in [place] as above.  Risk factors for ischemia include current smoker.  Given his current chest pain and history as above, plan is for further ischemic work-up with cardiac catheterization.  Echo ordered, pending, to assess EF and wall motion / structure.   Scheduled for [facility] 07/06/2020 with Dr. Kirke Corin. Risks and benefits of cardiac catheterization have been discussed with the patient.  These include bleeding, infection, kidney damage, stroke, heart attack, death.  The patient understands these risks and is willing to proceed.  Continue IV heparin.  Continue n.p.o. status.  Continue medications, including ASA, metoprolol, and high intensity statin for risk factor modification.  Further recommendations if needed following catheterization.  HTN --Current BP well controlled at 112/55.   --Continue current medications including metoprolol. --Recommend against PTA Mobic, as this can elevate BP.  Current tobacco use --Smokes 2 cigarettes daily, which she reports is decreased from his previous amount.   --Smoking cessation advised.     TIMI Risk Score for Unstable Angina or Non-ST Elevation MI:   The patient's TIMI risk score is 5, which indicates a 26% risk of all cause  mortality, new or recurrent myocardial infarction or need for urgent revascularization in the next 14 days.  For questions or updates, please contact [facility] Please consult [email or web address] for contact info under    Signed, Glender Augusta D Teofilo Lupinacci, PA-C  07/06/2020 8:51 AM  

## 2020-07-06 NOTE — Discharge Summary (Signed)
5        [place] at [facility]   PATIENT NAME: Jeffery Sweeney    MR#:  [id number]  DATE OF BIRTH:  1960-08-20  DATE OF ADMISSION:  07/05/2020   ADMITTING PHYSICIAN: Angie Fava, DO  DATE OF DISCHARGE: 07/06/2020  PRIMARY CARE PHYSICIAN: Patient, No Pcp Per   ADMISSION DIAGNOSIS:  Unstable angina (HCC) [I20.0] DISCHARGE DIAGNOSIS:  Principal Problem:   Unstable angina (HCC) Active Problems:   Chest pain   Hypertension   Tobacco abuse   Non-ST elevation (NSTEMI) myocardial infarction [facility])  SECONDARY DIAGNOSIS:   Past Medical History:  Diagnosis Date  . Hypertension   . Myocardial infarct (HCC)    x 3; PCI with single stent placement for each (in [place])   HOSPITAL COURSE:  60 year old male with a known history of MI with PCI, hypertension, tobacco abuse is admitted for non-STEMI.  Non-STEMI Status post cardiac catheterization on 07/06/2020 showing triple-vessel coronary artery disease requiring CABG He is being transferred to [facility] for same under Dr. Rennis Golden service Continue aspirin, metoprolol and high intensity statin  Essential hypertension Continue metoprolol  Chronic tobacco abuse Patient counseled for smoking cessation  DISCHARGE CONDITIONS:  Stable CONSULTS OBTAINED:  Treatment Team:  Antonieta Iba, MD DRUG ALLERGIES:  No Known Allergies DISCHARGE MEDICATIONS:   Allergies as of 07/06/2020   No Known Allergies     Medication List    STOP taking these medications   meloxicam 15 MG tablet Commonly known as: MOBIC     TAKE these medications   metoprolol tartrate 50 MG tablet Commonly known as: LOPRESSOR Take 50 mg by mouth 2 (two) times daily.      DISCHARGE INSTRUCTIONS:   DIET:  Cardiac diet DISCHARGE CONDITION:  Stable ACTIVITY:  Activity as tolerated OXYGEN:  Home Oxygen: No.  Oxygen Delivery: room air DISCHARGE LOCATION:  [facility] for CABG  If you experience worsening of your  admission symptoms, develop shortness of breath, life threatening emergency, suicidal or homicidal thoughts you must seek medical attention immediately by calling 911 or calling your MD immediately  if symptoms less severe.  You Must read complete instructions/literature along with all the possible adverse reactions/side effects for all the Medicines you take and that have been prescribed to you. Take any new Medicines after you have completely understood and accpet all the possible adverse reactions/side effects.   Please note  You were cared for by a hospitalist during your hospital stay. If you have any questions about your discharge medications or the care you received while you were in the hospital after you are discharged, you can call the unit and asked to speak with the hospitalist on call if the hospitalist that took care of you is not available. Once you are discharged, your primary care physician will handle any further medical issues. Please note that NO REFILLS for any discharge medications will be authorized once you are discharged, as it is imperative that you return to your primary care physician (or establish a relationship with a primary care physician if you do not have one) for your aftercare needs so that they can reassess your need for medications and monitor your lab values.    On the day of Discharge:  VITAL SIGNS:  Blood pressure 110/73, pulse 70, temperature 98.2 F (36.8 C), temperature source Oral, resp. rate 16, height 5\' 3"  (1.6 m), weight 78.1 kg, SpO2 98 %. PHYSICAL EXAMINATION:  GENERAL:  60 y.o.-year-old patient lying  in the bed with no acute distress.  EYES: Pupils equal, round, reactive to light and accommodation. No scleral icterus. Extraocular muscles intact.  HEENT: Head atraumatic, normocephalic. Oropharynx and nasopharynx clear.  NECK:  Supple, no jugular venous distention. No thyroid enlargement, no tenderness.  LUNGS: Normal breath sounds bilaterally, no  wheezing, rales,rhonchi or crepitation. No use of accessory muscles of respiration.  CARDIOVASCULAR: S1, S2 normal. No murmurs, rubs, or gallops.  ABDOMEN: Soft, non-tender, non-distended. Bowel sounds present. No organomegaly or mass.  EXTREMITIES: No pedal edema, cyanosis, or clubbing.  NEUROLOGIC: Cranial nerves II through XII are intact. Muscle strength 5/5 in all extremities. Sensation intact. Gait not checked.  PSYCHIATRIC: The patient is alert and oriented x 3.  SKIN: No obvious rash, lesion, or ulcer.  DATA REVIEW:   CBC Recent Labs  Lab 07/06/20 0423  WBC 8.1  HGB 13.9  HCT 41.7  PLT 238    Chemistries  Recent Labs  Lab 07/05/20 2037 07/05/20 2236 07/06/20 0423  NA 139  --  140  K 3.8  --  4.0  CL 108  --  106  CO2 22  --  26  GLUCOSE 113*  --  94  BUN 18  --  17  CREATININE 0.89  --  0.95  CALCIUM 10.3  --  9.8  MG  --    < > 2.1  AST 18  --   --   ALT 16  --   --   ALKPHOS 82  --   --   BILITOT 1.0  --   --    < > = values in this interval not displayed.     Outpatient follow-up   30 Day Unplanned Readmission Risk Score   Flowsheet Row ED to Hosp-Admission (Current) from 07/05/2020 in [facility] CARD[facility] CATH LAB  30 Day Unplanned Readmission Risk Score (%) 7.36 Filed at 07/06/2020 1200     This score is the patient's risk of an unplanned readmission within 30 days of being discharged (0 -100%). The score is based on dignosis, age, lab data, medications, orders, and past utilization.   Low:  0-14.9   Medium: 15-21.9   High: 22-29.9   Extreme: 30 and above         Management plans discussed with the patient, nursing, cardiology and they are in agreement.  CODE STATUS: Full Code   TOTAL TIME TAKING CARE OF THIS PATIENT: 45 minutes.    Delfino Lovett M.D on 07/06/2020 at 1:57 PM  [facility]   CC: Primary care physician; Patient, No Pcp Per   Note: This dictation was prepared with Dragon dictation along with smaller  phrase technology. Any transcriptional errors that result from this process are unintentional.

## 2020-07-06 NOTE — Consult Note (Signed)
Cardiology Consultation:   Patient ID: [name] MRN: [id number]; DOB: [birth year]  Admit date: 07/05/2020 Date of Consult: 07/06/2020  PCP:  Patient, No Pcp Per   [facility]  Cardiologist: Previously [place].  New [facility], Dr. Kirke Corin Advanced Practice Provider:  No care team member to display Electrophysiologist:  None }    Patient Profile:   Jeffery Sweeney is a 61 y.o. male with a hx of self-reported MI x3 (~5 yr prior) with subsequent PCI, HTN, current tobacco use at approximately 2 cigarettes daily, who is being seen today for the evaluation of NSTEMI at the request of Dr. Sherryll Burger.  History of Present Illness:   Jeffery Sweeney is a 60 year old male with PMH as above.  He reports a previous history of MI x3 with PCI in [place] ~5 years ago.  He is a current smoker 2 cigarettes daily and reports smoking for at least 30-40 years.  He works in a kitchen and stays busy but does not have a regular workout routine. He denies having a cardiologist within [place].  He reports that he moved back to [place] in 2019 and after his mother passed away.  He denies taking any cardiac medications at the time of her interview; however, on review of EMR, metoprolol is listed under home medications.  On 07/04/2020, he reports that he awoke with chest pain/pressure, diaphoresis, and shortness of breath.  This pressure was described as a bear-hug that only alleviated when breathing out very slowly.  He states that the chest pressure and shortness of breath has continued since that time with his worst episode occurring last night (07/05/2020).  Each episode is described as occurring longer in length.  His episode last night was several hours.  He reportedly tried Tums and antiacids with no relief.  The pain felt similar to that of his previous MIs, so when he started to have worsening pain, he decided to call 911.  He denies any associated nausea or emesis.  No signs or  symptoms of bleeding.  He denies any abdominal distention or lower extremity edema.    EMS arrived and administered nitro and ASA 324 mg in route.  He denies any improvement in pain with nitro, though it is documented as offering him some relief while in the ED.  In the emergency department, vitals significant for BP 158/121, HR 101 bpm.  Labs significant for glucose 113, high-sensitivity troponin 25  76  969, renal function stable at Cr 0.95, BUN 17 with K4.0 and Mg 2.3. H&H stable with Hgb 13.9 and HCT 41.7 EKG showed sinus tachycardia, 105 bpm, minimal / slight depression noted in lateral I, V5, V6 and anterior V2-V4.  Chest x-ray without active disease.  Cardiology was consulted given his elevated troponin and chest pain with concern for non-STEMI.  Past Medical History:  Diagnosis Date  . Hypertension   . Myocardial infarct (HCC)    x 3; PCI with single stent placement for each (in [place])    Past Surgical History:  Procedure Laterality Date  . CORONARY ANGIOPLASTY WITH STENT PLACEMENT     in [place] in 2018-19  . WRIST SURGERY Left    for cyst     Home Medications:  Prior to Admission medications   Medication Sig Start Date End Date Taking? Authorizing Provider  meloxicam (MOBIC) 15 MG tablet Take 1 tablet (15 mg total) by mouth daily. 02/24/20 02/23/21 Yes Fisher, Roselyn Bering, PA-C  metoprolol tartrate (LOPRESSOR) 50 MG  tablet Take 50 mg by mouth 2 (two) times daily.   Yes [provider]    Inpatient Medications: Scheduled Meds: . influenza vac split quadrivalent PF  0.5 mL Intramuscular Tomorrow-1000  . metoprolol tartrate  12.5 mg Oral BID  . sodium chloride flush  3 mL Intravenous Q12H   Continuous Infusions: . heparin 1,200 Units/hr (07/06/20 0716)  . nitroGLYCERIN 20 mcg/min (07/06/20 0802)   PRN Meds: acetaminophen **OR** acetaminophen, ondansetron (ZOFRAN) IV  Allergies:   No Known Allergies  Social History:   Social History   Socioeconomic  History  . Marital status: Divorced    Spouse name: Not on file  . Number of children: Not on file  . Years of education: Not on file  . Highest education level: Not on file  Occupational History  . Not on file  Tobacco Use  . Smoking status: Current Every Day Smoker    Packs/day: 0.50    Types: Cigarettes  . Smokeless tobacco: Never Used  Vaping Use  . Vaping Use: Never used  Substance and Sexual Activity  . Alcohol use: Not Currently  . Drug use: Not Currently  . Sexual activity: Not on file  Other Topics Concern  . Not on file  Social History Narrative  . Not on file   Social Determinants of Health   Financial Resource Strain: Not on file  Food Insecurity: Not on file  Transportation Needs: Not on file  Physical Activity: Not on file  Stress: Not on file  Social Connections: Not on file  Intimate Partner Violence: Not on file    Family History:   History reviewed. No pertinent family history.   ROS:  Please see the history of present illness.  Review of Systems  Constitutional: Positive for diaphoresis and malaise/fatigue.  Respiratory: Positive for shortness of breath. Negative for cough and hemoptysis.   Cardiovascular: Positive for chest pain. Negative for palpitations, orthopnea and leg swelling.  Gastrointestinal: Negative for blood in stool, melena, nausea and vomiting.  Genitourinary: Negative for hematuria.  Musculoskeletal: Negative for falls.  Neurological: Negative for dizziness, focal weakness and loss of consciousness.  All other systems reviewed and are negative.   All other ROS reviewed and negative.     Physical Exam/Data:   Vitals:   07/05/20 2313 07/06/20 0426 07/06/20 0500 07/06/20 0719  BP: (!) 159/89 (!) 102/56  (!) 112/55  Pulse: 67 74  74  Resp: 16 20  18   Temp: 98 F (36.7 C) 98 F (36.7 C)  98.2 F (36.8 C)  TempSrc: Oral Oral  Oral  SpO2: 100% 94%  93%  Weight: 78.6 kg  78.1 kg   Height: 5\' 3"  (1.6 m)       Intake/Output  Summary (Last 24 hours) at 07/06/2020 0851 Last data filed at 07/06/2020 0802 Gross per 24 hour  Intake 138.23 ml  Output 0 ml  Net 138.23 ml   Last 3 Weights 07/06/2020 07/05/2020 07/05/2020  Weight (lbs) 172 lb 2.9 oz 173 lb 3.2 oz 194 lb  Weight (kg) 78.1 kg 78.563 kg 87.998 kg     Body mass index is 30.5 kg/m.  General:  Well nourished, well developed, in no acute distress. Just woke from sleep. HEENT: normal Lymph: no adenopathy Neck: no JVD Endocrine:  No thryomegaly Vascular: No carotid bruits; FA pulses 2+ bilaterally without bruits  Cardiac:  normal S1, S2; RRR; no murmur  Lungs:  clear to auscultation bilaterally, no wheezing, rhonchi or rales  Abd: soft,  nontender, no hepatomegaly  Ext: no edema Musculoskeletal:  No deformities, BUE and BLE strength normal and equal Skin: warm and dry  Neuro:  CNs 2-12 intact, no focal abnormalities noted Psych:  Normal affect   EKG:  The EKG was personally reviewed and demonstrates:   sinus tachycardia, 105 bpm, minimal / slight depression noted in lateral I, V5, V6 and anterior V2-V4. Telemetry:  Telemetry was personally reviewed and demonstrates:  NSR 70-90s  Relevant CV Studies: Pending echo  Laboratory Data:  High Sensitivity Troponin:   Recent Labs  Lab 06/28/20 1517 06/28/20 1748 07/05/20 2037 07/05/20 2236 07/06/20 0608  TROPONINIHS 3 3 25* 76* 969*     Chemistry Recent Labs  Lab 07/05/20 2037 07/06/20 0423  NA 139 140  K 3.8 4.0  CL 108 106  CO2 22 26  GLUCOSE 113* 94  BUN 18 17  CREATININE 0.89 0.95  CALCIUM 10.3 9.8  GFRNONAA >60 >60  ANIONGAP 9 8    Recent Labs  Lab 07/05/20 2037  PROT 7.6  ALBUMIN 4.1  AST 18  ALT 16  ALKPHOS 82  BILITOT 1.0   Hematology Recent Labs  Lab 07/05/20 2037 07/06/20 0423  WBC 8.3 8.1  RBC 5.09 4.72  HGB 15.1 13.9  HCT 44.4 41.7  MCV 87.2 88.3  MCH 29.7 29.4  MCHC 34.0 33.3  RDW 12.9 13.2  PLT 287 238   BNP Recent Labs  Lab 07/05/20 2037  BNP 17.3     DDimer No results for input(s): DDIMER in the last 168 hours.   Radiology/Studies:  DG Chest Port 1 View  Result Date: 07/05/2020 CLINICAL DATA:  Chest pain EXAM: PORTABLE CHEST 1 VIEW COMPARISON:  12/29/2019 FINDINGS: The heart size and mediastinal contours are within normal limits. Both lungs are clear. The visualized skeletal structures are unremarkable. IMPRESSION: No active disease. Electronically Signed   By: Jasmine Pang M.D.   On: 07/05/2020 20:47     Assessment and Plan:   NSTEMI Self-reported history of MI x3 with PCI --Reports current chest pain with episodes of SOB and CP x2 days.  Current CP is described as improved from the previous evening.  Describes CP is similar to that of his previous MIs in [place] as above.  Risk factors for ischemia include current smoker.  Given his current chest pain and history as above, plan is for further ischemic work-up with cardiac catheterization.  Echo ordered, pending, to assess EF and wall motion / structure.   Scheduled for [facility] 07/06/2020 with Dr. Kirke Corin. Risks and benefits of cardiac catheterization have been discussed with the patient.  These include bleeding, infection, kidney damage, stroke, heart attack, death.  The patient understands these risks and is willing to proceed.  Continue IV heparin.  Continue n.p.o. status.  Continue medications, including ASA, metoprolol, and high intensity statin for risk factor modification.  Further recommendations if needed following catheterization.  HTN --Current BP well controlled at 112/55.   --Continue current medications including metoprolol. --Recommend against PTA Mobic, as this can elevate BP.  Current tobacco use --Smokes 2 cigarettes daily, which she reports is decreased from his previous amount.   --Smoking cessation advised.     TIMI Risk Score for Unstable Angina or Non-ST Elevation MI:   The patient's TIMI risk score is 5, which indicates a 26% risk of all cause  mortality, new or recurrent myocardial infarction or need for urgent revascularization in the next 14 days.  For questions or updates, please contact [facility] Please consult [email or web address] for contact info under    Signed, Lennon AlstromJacquelyn D Nelma Phagan, PA-C  07/06/2020 8:51 AM

## 2020-07-06 NOTE — Progress Notes (Signed)
ANTICOAGULATION CONSULT NOTE - Initial Consult  Pharmacy Consult for Heparin  Indication: chest pain/ACS  No Known Allergies  Patient Measurements: Height: 5\' 3"  (160 cm) Weight: 78.1 kg (172 lb 2.9 oz) IBW/kg (Calculated) : 56.9 Heparin Dosing Weight: 76.2 kg   Vital Signs: Temp: 98 F (36.7 C) (02/18 0426) Temp Source: Oral (02/18 0426) BP: 102/56 (02/18 0426) Pulse Rate: 74 (02/18 0426)  Labs: Recent Labs    07/05/20 2037 07/05/20 2039 07/05/20 2236 07/06/20 0423 07/06/20 0608  HGB 15.1  --   --  13.9  --   HCT 44.4  --   --  41.7  --   PLT 287  --   --  238  --   APTT  --  29  --   --   --   LABPROT  --  12.4  --   --   --   INR  --  1.0  --   --   --   HEPARINUNFRC  --   --   --   --  0.15*  CREATININE 0.89  --   --  0.95  --   TROPONINIHS 25*  --  76*  --  969*    Estimated Creatinine Clearance: 76.5 mL/min (by C-G formula based on SCr of 0.95 mg/dL).   Medical History: Past Medical History:  Diagnosis Date  . Hypertension   . Myocardial infarct (HCC)    x 3; PCI with single stent placement for each (in 07/08/20)    Medications:  Medications Prior to Admission  Medication Sig Dispense Refill Last Dose  . meloxicam (MOBIC) 15 MG tablet Take 1 tablet (15 mg total) by mouth daily. 30 tablet 2 Past Month at Unknown time  . metoprolol tartrate (LOPRESSOR) 50 MG tablet Take 50 mg by mouth 2 (two) times daily.   06/28/2020 at unknown    Assessment: Pharmacy consulted to dose heparin in this 60 year old male admitted with ACS/NSTEMI.  CrCl = 86.5 ml/min No prior anticoag noted.   Goal of Therapy:  Heparin level 0.3-0.7 units/ml Monitor platelets by anticoagulation protocol: Yes   Plan:  2/18:  HL @ 0454 = 0.23 Will order heparin 2300 units IV X 1 bolus and increase drip rate to 1200 units/hr.  Will recheck HL 6 hrs after rate change.   Jeffery Sweeney D 07/06/2020,7:14 AM

## 2020-07-06 NOTE — Progress Notes (Signed)
Patient transferred from [facility] to [facility] room [phone] at 1700. Patient alert and oriented x4. VS stable, denied pain or discomfort. Patient ambulated in room. Patient provided bed room phone and call light is within reach.

## 2020-07-07 DIAGNOSIS — I214 Non-ST elevation (NSTEMI) myocardial infarction: Principal | ICD-10-CM

## 2020-07-07 DIAGNOSIS — I10 Essential (primary) hypertension: Secondary | ICD-10-CM

## 2020-07-07 DIAGNOSIS — I25110 Atherosclerotic heart disease of native coronary artery with unstable angina pectoris: Secondary | ICD-10-CM

## 2020-07-07 DIAGNOSIS — I2510 Atherosclerotic heart disease of native coronary artery without angina pectoris: Secondary | ICD-10-CM

## 2020-07-07 LAB — URINALYSIS, ROUTINE W REFLEX MICROSCOPIC
Bilirubin Urine: NEGATIVE
Glucose, UA: NEGATIVE mg/dL
Hgb urine dipstick: NEGATIVE
Ketones, ur: NEGATIVE mg/dL
Leukocytes,Ua: NEGATIVE
Nitrite: NEGATIVE
Protein, ur: NEGATIVE mg/dL
Specific Gravity, Urine: 1.016 (ref 1.005–1.030)
pH: 5 (ref 5.0–8.0)

## 2020-07-07 LAB — CBC
HCT: 40.4 % (ref 39.0–52.0)
Hemoglobin: 14 g/dL (ref 13.0–17.0)
MCH: 30.7 pg (ref 26.0–34.0)
MCHC: 34.7 g/dL (ref 30.0–36.0)
MCV: 88.6 fL (ref 80.0–100.0)
Platelets: 228 10*3/uL (ref 150–400)
RBC: 4.56 MIL/uL (ref 4.22–5.81)
RDW: 13.1 % (ref 11.5–15.5)
WBC: 7.8 10*3/uL (ref 4.0–10.5)
nRBC: 0 % (ref 0.0–0.2)

## 2020-07-07 LAB — BASIC METABOLIC PANEL
Anion gap: 7 (ref 5–15)
BUN: 11 mg/dL (ref 6–20)
CO2: 24 mmol/L (ref 22–32)
Calcium: 8.7 mg/dL — ABNORMAL LOW (ref 8.9–10.3)
Chloride: 106 mmol/L (ref 98–111)
Creatinine, Ser: 0.89 mg/dL (ref 0.61–1.24)
GFR, Estimated: >60 mL/min (ref >60)
Glucose, Bld: 89 mg/dL (ref 70–99)
Potassium: 3.8 mmol/L (ref 3.5–5.1)
Sodium: 137 mmol/L (ref 135–145)

## 2020-07-07 LAB — POCT ACTIVATED CLOTTING TIME: Activated Clotting Time: 261 seconds

## 2020-07-07 LAB — PROTIME-INR
INR: 1 (ref 0.8–1.2)
Prothrombin Time: 12.4 seconds (ref 11.4–15.2)

## 2020-07-07 LAB — HEPARIN LEVEL (UNFRACTIONATED)
Heparin Unfractionated: 0.18 IU/mL — ABNORMAL LOW (ref 0.30–0.70)
Heparin Unfractionated: 0.34 IU/mL (ref 0.30–0.70)
Heparin Unfractionated: 0.3 IU/mL (ref 0.30–0.70)

## 2020-07-07 LAB — SURGICAL PCR SCREEN
MRSA, PCR: NEGATIVE
Staphylococcus aureus: NEGATIVE

## 2020-07-07 NOTE — Consult Note (Signed)
[address]       [place] [address]             [phone]        LUISANTONIO ADINOLFI [facility] Medical Record #[id number] Date of Birth: 18-Sep-1960  Referring: Dr. Zoila Shutter Primary Care: Patient, No Pcp Per Primary Cardiologist:Muhammad Kirke Corin, MD  Chief Complaint:    Coronary artery disease transferred for consideration of bypass surgery  History of Present Illness:     Patient is a 60 year old male with known history of coronary artery disease having had myocardial infarction and stent placement several years ago while living in [place].  Patient was originally seen in the emergency room on February 10 for a syncopal episode.  He described dizziness and "the room spinning", there was brief loss of consciousness.  CT of the head was done.  The patient was discharged home for outpatient cardiology follow-up.  He returned on February 17 after arrival from his job at [employer] where he had onset of his wheezing substernal chest pain and noted associated shortness of breath and diaphoresis.  He was admitted to [facility] and underwent cardiac catheterization yesterday  Patient is a long-term smoker denies diabetes   Lives alone in a boardinghouse in [place], works at [employer]  current Activity/ Functional Status: Patient is independent with mobility/ambulation, transfers, ADL's, IADL's.   Zubrod Score: At the time of surgery this patient's most appropriate activity status/level should be described as:     0    Normal activity, no symptoms     1    Restricted in physical strenuous activity but ambulatory, able to do out light work     2    Ambulatory and capable of self care, unable to do work activities, up and about                 more than 50%  Of the time                                3    Only limited self care, in bed greater than 50% of waking hours     4    Completely disabled, no self care, confined to bed or  chair     5    Moribund  Past Medical History:  Diagnosis Date  . Hypertension   . Multiple vessel coronary artery disease   . Myocardial infarct (HCC)    x 3; PCI with single stent placement for each (in [place])  . NSTEMI (non-ST elevated myocardial infarction) [facility])    LHC 2/18 with multivessel CAD and transfer to [facility] for CABG eval  . Tobacco use    2 cigarettes daily    Past Surgical History:  Procedure Laterality Date  . CORONARY ANGIOPLASTY WITH STENT PLACEMENT     in [place] in 2018-19  . WRIST SURGERY Left    for cyst    Social History   Tobacco Use  Smoking Status Current Every Day Smoker  . Packs/day: 0.50  . Types: Cigarettes  Smokeless Tobacco Never Used    Social History   Substance and Sexual Activity  Alcohol Use Not Currently     No Known Allergies  Current Facility-Administered Medications  Medication Dose Route Frequency Provider Last Rate Last Admin  . acetaminophen (TYLENOL) tablet 650 mg  650 mg Oral Q4H PRN Lennon Alstrom,  PA-C      . aspirin EC tablet 81 mg  81 mg Oral Daily Marisue IvanVisser, Jacquelyn D, PA-C   81 mg at 07/07/20 0835  . atorvastatin (LIPITOR) tablet 80 mg  80 mg Oral Daily Marisue IvanVisser, Jacquelyn D, PA-C   80 mg at 07/07/20 0835  . heparin ADULT infusion 100 units/mL (25000 units/27050mL)  1,400 Units/hr Intravenous Continuous Titus Mouldmend, Caron G, RPH 14 mL/hr at 07/07/20 1128 1,400 Units/hr at 07/07/20 1128  . metoprolol tartrate (LOPRESSOR) tablet 12.5 mg  12.5 mg Oral BID Marisue IvanVisser, Jacquelyn D, PA-C   12.5 mg at 07/07/20 [id number]  . nitroGLYCERIN (NITROSTAT) SL tablet 0.4 mg  0.4 mg Sublingual Q5 Min x 3 PRN Michaelle BirksVisser, Jacquelyn D, PA-C      . ondansetron (ZOFRAN) injection 4 mg  4 mg Intravenous Q6H PRN Marisue IvanVisser, Jacquelyn D, PA-C        Medications Prior to Admission  Medication Sig Dispense Refill Last Dose  . metoprolol tartrate (LOPRESSOR) 50 MG tablet Take 50 mg by mouth 2 (two) times daily.   unknown at unknown    Family  history: Patient's father is alive with diabetes Mother died last year at age 61 with emphysema One brother died from cancer last year-patient is unclear of the details but it sounds like he may have had mesothelioma  Review of Systems:     Cardiac Review of Systems: Y or  [    ]= no  Chest Pain [  y  ]  Resting SOB Milo.Brash[n   ] Exertional SOB  Cove.Etienne[y  ]  Orthopnea [ n ]   Pedal Edema [ n  ]    Palpitations [n  ] Syncope  [ y ]   Presyncope [  y ]  General Review of Systems: [Y] = yes [  ]=no Constitional: recent weight change [  ]; anorexia [  ]; fatigue [  ]; nausea [  ]; night sweats [  ]; fever [  ]; or chills [  ]                                                               Dental: Last Dentist visit: tooth abscess in er 9/21  Eye : blurred vision [  ]; diplopia [   ]; vision changes [  ];  Amaurosis fugax[  ]; Resp: cough [  ];  wheezing[  ];  hemoptysis[  ]; shortness of breath[ y ]; paroxysmal nocturnal dyspnea[  ]; dyspnea on exertion[y  ]; or orthopnea[  ];  GI:  gallstones[  ], vomiting[  ];  dysphagia[  ]; melena[  ];  hematochezia [  ]; heartburn[  ];   Hx of  Colonoscopy[  ]; GU: kidney stones [  ]; hematuria[  ];   dysuria [  ];  nocturia[  ];  history of     obstruction [  ]; urinary frequency [  ]             Skin: rash, swelling[  ];, hair loss[  ];  peripheral edema[  ];  or itching[  ]; Musculosketetal: myalgias[  ];  joint swelling[  ];  joint erythema[  ];  joint pain[  ];  back pain[  ];  Heme/Lymph: bruising[  ];  bleeding[  ];  anemia[  ];  Neuro: TIA[  ];  headaches[  ];  stroke[  ];  vertigo[  ];  seizures[  ];   paresthesias[  ];  difficulty walking[  ];  Psych:depression[  ]; anxiety[  ];  Endocrine: diabetes[  n];  thyroid dysfunction[  ];  y  Physical Exam: BP 130/78   Pulse 72   Temp 97.8 F (36.6 C) (Oral)   Resp 19   SpO2 95%    General appearance: alert, cooperative and no distress Neck: no adenopathy, no carotid bruit, no JVD, supple, symmetrical,  trachea midline and thyroid not enlarged, symmetric, no tenderness/mass/nodules Resp: clear to auscultation bilaterally Cardio: regular rate and rhythm, S1, S2 normal, no murmur, click, rub or gallop GI: soft, non-tender; bowel sounds normal; no masses,  no organomegaly Extremities: extremities normal, atraumatic, no cyanosis or edema and Homans sign is negative, no sign of DVT Neurologic: Grossly normal Cath site right radial dressing intact, has had previous surgery on the left wrist at age 60, he is right-handed  Diagnostic Studies & Laboratory data:     Recent Radiology Findings:   CARDIAC CATHETERIZATION  Result Date: 07/06/2020  The left ventricular systolic function is normal.  LV end diastolic pressure is normal.  The left ventricular ejection fraction is 55-65% by visual estimate.  Prox RCA lesion is 80% stenosed.  Dist RCA lesion is 30% stenosed.  RPDA lesion is 90% stenosed.  Prox LAD to Mid LAD lesion is 60% stenosed.  1st Diag lesion is 90% stenosed.  Lat 1st Diag lesion is 90% stenosed.  Mid Cx to Dist Cx lesion is 90% stenosed.  2nd Diag lesion is 80% stenosed.  2nd Mrg lesion is 90% stenosed.  1.  Significant three-vessel coronary artery disease.  The LAD disease appears moderate angiographically.  However, it is diffusely diseased in the proximal mid segment and was highly significant by fractional flow reserve evaluation with an IFR ratio of 0.74.  In addition, there is severe stenosis in the bifurcating first diagonal, second diagonal and right coronary artery.  The left circumflex is also significantly diseased in the mid to distal segment but OM branches are small and likely not graftable. 2.  Normal LV systolic function and normal left ventricular end-diastolic pressure. Recommendations: I recommend transfer to [facility] for CABG evaluation. Resume heparin drip 6 hours after sheath pull.   DG Chest Port 1 View  Result Date: 07/05/2020 CLINICAL DATA:  Chest  pain EXAM: PORTABLE CHEST 1 VIEW COMPARISON:  12/29/2019 FINDINGS: The heart size and mediastinal contours are within normal limits. Both lungs are clear. The visualized skeletal structures are unremarkable. IMPRESSION: No active disease. Electronically Signed   By: Jasmine Pang M.D.   On: 07/05/2020 20:47   ECHOCARDIOGRAM COMPLETE  Result Date: 07/06/2020    ECHOCARDIOGRAM REPORT   Patient Name:   BAYNE FOSNAUGH Date of Exam: 07/06/2020 Medical Rec #:  [id number]        Height:       63.0 in Accession #:    [id number]       Weight:       172.2 lb Date of Birth:  11/17/1960        BSA:          1.814 m Patient Age:    59 years         BP:           112/55 mmHg Patient Gender: M  HR:           74 bpm. Exam Location:  [facility] Procedure: 2D Echo, Cardiac Doppler, Color Doppler and Strain Analysis Indications:     NSTEMI I21.4  History:         Patient has no prior history of Echocardiogram examinations.                  Previous Myocardial Infarction; Risk Factors:Hypertension.  Sonographer:     Cristela Blue RDCS (AE) Referring Phys:  [id number] Lennon Alstrom Diagnosing Phys: Lorine Bears MD  Sonographer Comments: Global longitudinal strain was attempted. IMPRESSIONS  1. Left ventricular ejection fraction, by estimation, is 55 to 60%. The left ventricle has normal function. The left ventricle has no regional wall motion abnormalities. There is mild left ventricular hypertrophy. Left ventricular diastolic parameters were normal. The average left ventricular global longitudinal strain is -15.2 %. The global longitudinal strain is abnormal.  2. Right ventricular systolic function is normal. The right ventricular size is normal.  3. The mitral valve is normal in structure. No evidence of mitral valve regurgitation. No evidence of mitral stenosis.  4. The aortic valve is normal in structure. Aortic valve regurgitation is not visualized. No aortic stenosis is present.  5. The inferior vena cava is normal in  size with greater than 50% respiratory variability, suggesting right atrial pressure of 3 mmHg. FINDINGS  Left Ventricle: Left ventricular ejection fraction, by estimation, is 55 to 60%. The left ventricle has normal function. The left ventricle has no regional wall motion abnormalities. The average left ventricular global longitudinal strain is -15.2 %. The global longitudinal strain is abnormal. The left ventricular internal cavity size was normal in size. There is mild left ventricular hypertrophy. Left ventricular diastolic parameters were normal. Right Ventricle: The right ventricular size is normal. No increase in right ventricular wall thickness. Right ventricular systolic function is normal. Left Atrium: Left atrial size was normal in size. Right Atrium: Right atrial size was normal in size. Pericardium: There is no evidence of pericardial effusion. Mitral Valve: The mitral valve is normal in structure. No evidence of mitral valve regurgitation. No evidence of mitral valve stenosis. Tricuspid Valve: The tricuspid valve is normal in structure. Tricuspid valve regurgitation is not demonstrated. No evidence of tricuspid stenosis. Aortic Valve: The aortic valve is normal in structure. Aortic valve regurgitation is not visualized. No aortic stenosis is present. Aortic valve mean gradient measures 3.0 mmHg. Aortic valve peak gradient measures 4.9 mmHg. Aortic valve area, by VTI measures 2.21 cm. Pulmonic Valve: The pulmonic valve was normal in structure. Pulmonic valve regurgitation is not visualized. No evidence of pulmonic stenosis. Aorta: The aortic root is normal in size and structure. Venous: The inferior vena cava is normal in size with greater than 50% respiratory variability, suggesting right atrial pressure of 3 mmHg. IAS/Shunts: No atrial level shunt detected by color flow Doppler.  LEFT VENTRICLE PLAX 2D LVIDd:         3.97 cm  Diastology LVIDs:         2.80 cm  LV e' medial:    6.09 cm/s LV PW:          1.27 cm  LV E/e' medial:  10.3 LV IVS:        0.97 cm  LV e' lateral:   8.16 cm/s LVOT diam:     2.10 cm  LV E/e' lateral: 7.7 LV SV:         50 LV SV Index:  28       2D Longitudinal Strain LVOT Area:     3.46 cm 2D Strain GLS Avg:     -15.2 %                          3D Volume EF:                         3D EF:        52 %                         LV EDV:       110 ml                         LV ESV:       52 ml                         LV SV:        57 ml RIGHT VENTRICLE RV Basal diam:  2.12 cm RV S prime:     9.90 cm/s TAPSE (M-mode): 3.7 cm LEFT ATRIUM             Index       RIGHT ATRIUM           Index LA diam:        2.70 cm 1.49 cm/m  RA Area:     11.70 cm LA Vol (A2C):   50.5 ml 27.83 ml/m RA Volume:   23.60 ml  13.01 ml/m LA Vol (A4C):   60.8 ml 33.51 ml/m LA Biplane Vol: 57.1 ml 31.47 ml/m  AORTIC VALVE                   PULMONIC VALVE AV Area (Vmax):    1.98 cm    PV Vmax:        0.27 m/s AV Area (Vmean):   2.18 cm    PV Peak grad:   0.3 mmHg AV Area (VTI):     2.21 cm    RVOT Peak grad: 1 mmHg AV Vmax:           110.50 cm/s AV Vmean:          81.750 cm/s AV VTI:            0.227 m AV Peak Grad:      4.9 mmHg AV Mean Grad:      3.0 mmHg LVOT Vmax:         63.30 cm/s LVOT Vmean:        51.400 cm/s LVOT VTI:          0.145 m LVOT/AV VTI ratio: 0.64  AORTA Ao Root diam: 3.15 cm MITRAL VALVE               TRICUSPID VALVE MV Area (PHT): 5.06 cm    TR Peak grad:   13.2 mmHg MV Decel Time: 150 msec    TR Vmax:        182.00 cm/s MV E velocity: 63.00 cm/s MV A velocity: 64.30 cm/s  SHUNTS MV E/A ratio:  0.98        Systemic VTI:  0.14 m  Systemic Diam: 2.10 cm Lorine Bears MD Electronically signed by Lorine Bears MD Signature Date/Time: 07/06/2020/11:48:17 AM    Final      I have independently reviewed the above radiologic studies and discussed with the patient   Recent Lab Findings: Lab Results  Component Value Date   WBC 7.8 07/07/2020   HGB 14.0 07/07/2020    HCT 40.4 07/07/2020   PLT 228 07/07/2020   GLUCOSE 89 07/07/2020   ALT 16 07/05/2020   AST 18 07/05/2020   NA 137 07/07/2020   K 3.8 07/07/2020   CL 106 07/07/2020   CREATININE 0.89 07/07/2020   BUN 11 07/07/2020   CO2 24 07/07/2020   INR 1.0 07/07/2020      Assessment / Plan:   #1 known coronary occlusive disease with significant three-vessel disease and recent non-STEMI myocardial infarction #2 long-term tobacco abuse #3 hypertension #4 near syncopal episode early February without definite diagnosis as to etiology  I reviewed the patient's x-rays and cardiac cath and echocardiogram.  He has significant proximal LAD and proximal right coronary artery disease-in addition he has significant distal disease both in the posterior descending and the entire circumflex system is small and probably not bypassable.  However he would be a poor candidate for multivessel angioplasty considering the anatomy.  I discussed with him risks and options of proceeding with coronary artery bypass grafting, including death infection stroke myocardial infarction bleeding blood transfusion.  We also discussed the need to immediately stop smoking if he wants to obtain a reasonable result from his coronary artery bypass.  He is willing to proceed.  Tentative surgery on Monday.   Delight Ovens MD      [address] [place] [place] [address] Office [phone]     07/07/2020 12:17 PM

## 2020-07-07 NOTE — Progress Notes (Signed)
ANTICOAGULATION CONSULT NOTE   Pharmacy Consult for Heparin  Indication: S/P [facility] for CABG eval   No Known Allergies  Patient Measurements:   Heparin Dosing Weight: 76.2 kg   Vital Signs: Temp: 97.8 F (36.6 C) (02/19 0817) Temp Source: Oral (02/19 0817) BP: 130/87 (02/19 0817) Pulse Rate: 82 (02/19 0817)  Labs: Recent Labs    07/05/20 2037 07/05/20 2039 07/05/20 2236 07/06/20 0423 07/06/20 0608 07/07/20 0309  HGB 15.1  --   --  13.9  --  14.0  HCT 44.4  --   --  41.7  --  40.4  PLT 287  --   --  238  --  228  APTT  --  29  --   --   --   --   LABPROT  --  12.4  --   --   --  12.4  INR  --  1.0  --   --   --  1.0  HEPARINUNFRC  --   --   --   --  0.15* 0.18*  CREATININE 0.89  --   --  0.95  --  0.89  TROPONINIHS 25*  --  76*  --  969*  --     Estimated Creatinine Clearance: 81.6 mL/min (by C-G formula based on SCr of 0.89 mg/dL).  Assessment: Pharmacy consulted to dose heparin in this 60 year old male admitted with ACS/NSTEMI. No prior anticoag noted. S/p cath 2/18. Found to have significant 3v disease for CABG evaluation.  Heparin level therapeutic at 0.34 after infusion increased to 1400 units/hr. No s/sx bleeding reported.  Goal of Therapy:  Heparin level 0.3-0.7 units/ml Monitor platelets by anticoagulation protocol: Yes   Plan:  Continue IV heparin 1400 units/hr Confirmatory heparin level in 6 hours Daily heparin level, CBC, monitor s/sx bleeding  Laverna Peace, PharmD PGY-1 Pharmacy Resident 07/07/2020 11:19 AM Please see AMION for all pharmacy numbers

## 2020-07-07 NOTE — Progress Notes (Signed)
ANTICOAGULATION CONSULT NOTE   Pharmacy Consult for Heparin  Indication: S/P [facility] for CABG eval   No Known Allergies  Patient Measurements:   Heparin Dosing Weight: 76.2 kg   Vital Signs: Temp: 98.6 F (37 C) (02/19 0028) Temp Source: Oral (02/19 0028) BP: 120/71 (02/19 0028) Pulse Rate: 89 (02/19 0028)  Labs: Recent Labs    07/05/20 2037 07/05/20 2039 07/05/20 2236 07/06/20 0423 07/06/20 0608 07/07/20 0309  HGB 15.1  --   --  13.9  --  14.0  HCT 44.4  --   --  41.7  --  40.4  PLT 287  --   --  238  --  228  APTT  --  29  --   --   --   --   LABPROT  --  12.4  --   --   --  12.4  INR  --  1.0  --   --   --  1.0  HEPARINUNFRC  --   --   --   --  0.15* 0.18*  CREATININE 0.89  --   --  0.95  --   --   TROPONINIHS 25*  --  76*  --  969*  --     Estimated Creatinine Clearance: 76.5 mL/min (by C-G formula based on SCr of 0.95 mg/dL).  Assessment: Pharmacy consulted to dose heparin in this 60 year old male admitted with ACS/NSTEMI. No prior anticoag noted. S/p cath 2/18. Found to have significant 3v disease for CABG evaluation.  Heparin level subtherapeutic (0.18) on gtt at 1200 units/hr. No issues with line or bleeding reported per RN.  Goal of Therapy:  Heparin level 0.3-0.7 units/ml Monitor platelets by anticoagulation protocol: Yes   Plan:  Increase IV heparin to 1400 units/hr  F/u 6 hr heparin level  Christoper Fabian, PharmD, BCPS Please see amion for complete clinical pharmacist phone list 07/07/2020 4:14 AM

## 2020-07-07 NOTE — Progress Notes (Signed)
ANTICOAGULATION CONSULT NOTE   Pharmacy Consult for Heparin  Indication: S/P [facility] for CABG eval   No Known Allergies  Patient Measurements:   Heparin Dosing Weight: 76.2 kg   Vital Signs: Temp: 97.3 F (36.3 C) (02/19 1522) Temp Source: Oral (02/19 1522) BP: 107/77 (02/19 1522) Pulse Rate: 76 (02/19 1522)  Labs: Recent Labs    07/05/20 2037 07/05/20 2039 07/05/20 2236 07/06/20 0423 07/06/20 6063 07/06/20 0608 07/07/20 0309 07/07/20 1028 07/07/20 1642  HGB 15.1  --   --  13.9  --   --  14.0  --   --   HCT 44.4  --   --  41.7  --   --  40.4  --   --   PLT 287  --   --  238  --   --  228  --   --   APTT  --  29  --   --   --   --   --   --   --   LABPROT  --  12.4  --   --   --   --  12.4  --   --   INR  --  1.0  --   --   --   --  1.0  --   --   HEPARINUNFRC  --   --   --   --  0.15*   < > 0.18* 0.34 0.30  CREATININE 0.89  --   --  0.95  --   --  0.89  --   --   TROPONINIHS 25*  --  76*  --  969*  --   --   --   --    < > = values in this interval not displayed.    Estimated Creatinine Clearance: 81.6 mL/min (by C-G formula based on SCr of 0.89 mg/dL).  Assessment: Pharmacy consulted to dose heparin in this 60 year old male admitted with ACS/NSTEMI. No prior anticoag noted. S/p cath 2/18. Found to have significant 3v disease for CABG evaluation.  Heparin level therapeutic at 0.3 on 1400 units/hr. No s/sx bleeding reported.  Goal of Therapy:  Heparin level 0.3-0.7 units/ml Monitor platelets by anticoagulation protocol: Yes   Plan:  Continue IV heparin 1400 units/hr Daily heparin level, CBC, monitor s/sx bleeding  Reece Leader, Colon Flattery, BCCP Clinical Pharmacist  07/07/2020 5:57 PM   [facility] pharmacy phone numbers are listed on [email or web address]

## 2020-07-07 NOTE — Progress Notes (Addendum)
Progress Note  Patient Name: Jeffery FischerRobert E Sweeney Date of Encounter: 07/07/2020  [facility] Cardiologist: Lorine BearsMuhammad Arida, MD   Subjective   No chest pain and no SOB.  Feels well.   Inpatient Medications    Scheduled Meds: . aspirin EC  81 mg Oral Daily  . atorvastatin  80 mg Oral Daily  . metoprolol tartrate  12.5 mg Oral BID   Continuous Infusions: . heparin 1,400 Units/hr (07/07/20 0427)   PRN Meds: acetaminophen, nitroGLYCERIN, ondansetron (ZOFRAN) IV   Vital Signs    Vitals:   07/06/20 1911 07/07/20 0028 07/07/20 0428 07/07/20 0817  BP: (!) 145/84 120/71 (!) 160/72 130/87  Pulse: 83 89 75 82  Resp: 19 15 16 19   Temp: 97.8 F (36.6 C) 98.6 F (37 C) 98.8 F (37.1 C) 97.8 F (36.6 C)  TempSrc: Oral Oral Oral Oral  SpO2: 98% 96% 96% 95%    Intake/Output Summary (Last 24 hours) at 07/07/2020 0832 Last data filed at 07/07/2020 0028 Gross per 24 hour  Intake --  Output 450 ml  Net -450 ml   Last 3 Weights 07/06/2020 07/05/2020 07/05/2020  Weight (lbs) 172 lb 2.9 oz 173 lb 3.2 oz 194 lb  Weight (kg) 78.1 kg 78.563 kg 87.998 kg      Telemetry    SR - Personally Reviewed  ECG    No new - Personally Reviewed  Physical Exam   GEN: No acute distress.   Neck: No JVD Cardiac: RRR, no murmurs, rubs, or gallops. Rt wrist cath site without hematoma and good pulse Respiratory: Clear to auscultation bilaterally. GI: Soft, nontender, non-distended  MS: No edema; No deformity. Neuro:  Nonfocal  Psych: Normal affect   Labs    High Sensitivity Troponin:   Recent Labs  Lab 06/28/20 1517 06/28/20 1748 07/05/20 2037 07/05/20 2236 07/06/20 0608  TROPONINIHS 3 3 25* 76* 969*      Chemistry Recent Labs  Lab 07/05/20 2037 07/06/20 0423 07/07/20 0309  NA 139 140 137  K 3.8 4.0 3.8  CL 108 106 106  CO2 22 26 24   GLUCOSE 113* 94 89  BUN 18 17 11   CREATININE 0.89 0.95 0.89  CALCIUM 10.3 9.8 8.7*  PROT 7.6  --   --   ALBUMIN 4.1  --   --   AST 18  --    --   ALT 16  --   --   ALKPHOS 82  --   --   BILITOT 1.0  --   --   GFRNONAA >60 >60 >60  ANIONGAP 9 8 7      Hematology Recent Labs  Lab 07/05/20 2037 07/06/20 0423 07/07/20 0309  WBC 8.3 8.1 7.8  RBC 5.09 4.72 4.56  HGB 15.1 13.9 14.0  HCT 44.4 41.7 40.4  MCV 87.2 88.3 88.6  MCH 29.7 29.4 30.7  MCHC 34.0 33.3 34.7  RDW 12.9 13.2 13.1  PLT 287 238 228    BNP Recent Labs  Lab 07/05/20 2037  BNP 17.3     DDimer No results for input(s): DDIMER in the last 168 hours.   Radiology    CARDIAC CATHETERIZATION  Result Date: 07/06/2020  The left ventricular systolic function is normal.  LV end diastolic pressure is normal.  The left ventricular ejection fraction is 55-65% by visual estimate.  Prox RCA lesion is 80% stenosed.  Dist RCA lesion is 30% stenosed.  RPDA lesion is 90% stenosed.  Prox LAD to Mid LAD lesion is 60%  stenosed.  1st Diag lesion is 90% stenosed.  Lat 1st Diag lesion is 90% stenosed.  Mid Cx to Dist Cx lesion is 90% stenosed.  2nd Diag lesion is 80% stenosed.  2nd Mrg lesion is 90% stenosed.  1.  Significant three-vessel coronary artery disease.  The LAD disease appears moderate angiographically.  However, it is diffusely diseased in the proximal mid segment and was highly significant by fractional flow reserve evaluation with an IFR ratio of 0.74.  In addition, there is severe stenosis in the bifurcating first diagonal, second diagonal and right coronary artery.  The left circumflex is also significantly diseased in the mid to distal segment but OM branches are small and likely not graftable. 2.  Normal LV systolic function and normal left ventricular end-diastolic pressure. Recommendations: I recommend transfer to [facility] for CABG evaluation. Resume heparin drip 6 hours after sheath pull.   DG Chest Port 1 View  Result Date: 07/05/2020 CLINICAL DATA:  Chest pain EXAM: PORTABLE CHEST 1 VIEW COMPARISON:  12/29/2019 FINDINGS: The heart size  and mediastinal contours are within normal limits. Both lungs are clear. The visualized skeletal structures are unremarkable. IMPRESSION: No active disease. Electronically Signed   By: Jasmine Pang M.D.   On: 07/05/2020 20:47   ECHOCARDIOGRAM COMPLETE  Result Date: 07/06/2020    ECHOCARDIOGRAM REPORT   Patient Name:   Jeffery Sweeney Date of Exam: 07/06/2020 Medical Rec #:  [id number]        Height:       63.0 in Accession #:    [id number]       Weight:       172.2 lb Date of Birth:  [birth year]        BSA:          1.814 m Patient Age:    59 years         BP:           112/55 mmHg Patient Gender: M                HR:           74 bpm. Exam Location:  [facility] Procedure: 2D Echo, Cardiac Doppler, Color Doppler and Strain Analysis Indications:     NSTEMI I21.4  History:         Patient has no prior history of Echocardiogram examinations.                  Previous Myocardial Infarction; Risk Factors:Hypertension.  Sonographer:     Cristela Blue RDCS (AE) Referring Phys:  [id number] Lennon Alstrom Diagnosing Phys: Lorine Bears MD  Sonographer Comments: Global longitudinal strain was attempted. IMPRESSIONS  1. Left ventricular ejection fraction, by estimation, is 55 to 60%. The left ventricle has normal function. The left ventricle has no regional wall motion abnormalities. There is mild left ventricular hypertrophy. Left ventricular diastolic parameters were normal. The average left ventricular global longitudinal strain is -15.2 %. The global longitudinal strain is abnormal.  2. Right ventricular systolic function is normal. The right ventricular size is normal.  3. The mitral valve is normal in structure. No evidence of mitral valve regurgitation. No evidence of mitral stenosis.  4. The aortic valve is normal in structure. Aortic valve regurgitation is not visualized. No aortic stenosis is present.  5. The inferior vena cava is normal in size with greater than 50% respiratory variability, suggesting right atrial  pressure of 3 mmHg. FINDINGS  Left Ventricle: Left ventricular ejection  fraction, by estimation, is 55 to 60%. The left ventricle has normal function. The left ventricle has no regional wall motion abnormalities. The average left ventricular global longitudinal strain is -15.2 %. The global longitudinal strain is abnormal. The left ventricular internal cavity size was normal in size. There is mild left ventricular hypertrophy. Left ventricular diastolic parameters were normal. Right Ventricle: The right ventricular size is normal. No increase in right ventricular wall thickness. Right ventricular systolic function is normal. Left Atrium: Left atrial size was normal in size. Right Atrium: Right atrial size was normal in size. Pericardium: There is no evidence of pericardial effusion. Mitral Valve: The mitral valve is normal in structure. No evidence of mitral valve regurgitation. No evidence of mitral valve stenosis. Tricuspid Valve: The tricuspid valve is normal in structure. Tricuspid valve regurgitation is not demonstrated. No evidence of tricuspid stenosis. Aortic Valve: The aortic valve is normal in structure. Aortic valve regurgitation is not visualized. No aortic stenosis is present. Aortic valve mean gradient measures 3.0 mmHg. Aortic valve peak gradient measures 4.9 mmHg. Aortic valve area, by VTI measures 2.21 cm. Pulmonic Valve: The pulmonic valve was normal in structure. Pulmonic valve regurgitation is not visualized. No evidence of pulmonic stenosis. Aorta: The aortic root is normal in size and structure. Venous: The inferior vena cava is normal in size with greater than 50% respiratory variability, suggesting right atrial pressure of 3 mmHg. IAS/Shunts: No atrial level shunt detected by color flow Doppler.  LEFT VENTRICLE PLAX 2D LVIDd:         3.97 cm  Diastology LVIDs:         2.80 cm  LV e' medial:    6.09 cm/s LV PW:         1.27 cm  LV E/e' medial:  10.3 LV IVS:        0.97 cm  LV e' lateral:    8.16 cm/s LVOT diam:     2.10 cm  LV E/e' lateral: 7.7 LV SV:         50 LV SV Index:   28       2D Longitudinal Strain LVOT Area:     3.46 cm 2D Strain GLS Avg:     -15.2 %                          3D Volume EF:                         3D EF:        52 %                         LV EDV:       110 ml                         LV ESV:       52 ml                         LV SV:        57 ml RIGHT VENTRICLE RV Basal diam:  2.12 cm RV S prime:     9.90 cm/s TAPSE (M-mode): 3.7 cm LEFT ATRIUM             Index       RIGHT ATRIUM  Index LA diam:        2.70 cm 1.49 cm/m  RA Area:     11.70 cm LA Vol (A2C):   50.5 ml 27.83 ml/m RA Volume:   23.60 ml  13.01 ml/m LA Vol (A4C):   60.8 ml 33.51 ml/m LA Biplane Vol: 57.1 ml 31.47 ml/m  AORTIC VALVE                   PULMONIC VALVE AV Area (Vmax):    1.98 cm    PV Vmax:        0.27 m/s AV Area (Vmean):   2.18 cm    PV Peak grad:   0.3 mmHg AV Area (VTI):     2.21 cm    RVOT Peak grad: 1 mmHg AV Vmax:           110.50 cm/s AV Vmean:          81.750 cm/s AV VTI:            0.227 m AV Peak Grad:      4.9 mmHg AV Mean Grad:      3.0 mmHg LVOT Vmax:         63.30 cm/s LVOT Vmean:        51.400 cm/s LVOT VTI:          0.145 m LVOT/AV VTI ratio: 0.64  AORTA Ao Root diam: 3.15 cm MITRAL VALVE               TRICUSPID VALVE MV Area (PHT): 5.06 cm    TR Peak grad:   13.2 mmHg MV Decel Time: 150 msec    TR Vmax:        182.00 cm/s MV E velocity: 63.00 cm/s MV A velocity: 64.30 cm/s  SHUNTS MV E/A ratio:  0.98        Systemic VTI:  0.14 m                            Systemic Diam: 2.10 cm Lorine Bears MD Electronically signed by Lorine Bears MD Signature Date/Time: 07/06/2020/11:48:17 AM    Final     Cardiac Studies   Echo 07/06/20 1. Left ventricular ejection fraction, by estimation, is 55 to 60%. The  left ventricle has normal function. The left ventricle has no regional  wall motion abnormalities. There is mild left ventricular hypertrophy.  Left ventricular  diastolic parameters  were normal. The average left ventricular global longitudinal strain is  -15.2 %. The global longitudinal strain is abnormal.  2. Right ventricular systolic function is normal. The right ventricular  size is normal.  3. The mitral valve is normal in structure. No evidence of mitral valve  regurgitation. No evidence of mitral stenosis.  4. The aortic valve is normal in structure. Aortic valve regurgitation is  not visualized. No aortic stenosis is present.  5. The inferior vena cava is normal in size with greater than 50%  respiratory variability, suggesting right atrial pressure of 3 mmHg.   LHC 07/06/2020  The left ventricular systolic function is normal.  LV end diastolic pressure is normal.  The left ventricular ejection fraction is 55-65% by visual estimate.  Prox RCA lesion is 80% stenosed.  Dist RCA lesion is 30% stenosed.  RPDA lesion is 90% stenosed.  Prox LAD to Mid LAD lesion is 60% stenosed.  1st Diag lesion is 90% stenosed.  Lat 1st Diag lesion is 90% stenosed.  Mid Cx to Dist Cx lesion is 90% stenosed.  2nd Diag lesion is 80% stenosed.  2nd Mrg lesion is 90% stenosed. 1. Significant three-vessel coronary artery disease. The LAD disease appears moderate angiographically. However, it is diffusely diseased in the proximal mid segment and was highly significant by fractional flow reserve evaluation with an IFR ratio of 0.74. In addition, there is severe stenosis in the bifurcating first diagonal, second diagonal and right coronary artery. The left circumflex is also significantly diseased in the mid to distal segment but OM branches are small and likely not graftable. 2. Normal LV systolic function and normal left ventricular end-diastolic pressure.  Recommendations: I recommend transfer to [facility] for CABG evaluation. Resume heparin drip 6 hours after sheath pull.  Diagnostic Dominance:  Right    Intervention    Patient Profile     60 y.o. male with a hx of self-reported MI x3 (~5 yr prior)with subsequent PCI, HTN, current tobacco use at approximately 2 cigarettes daily,now admitted with angina, NSTEMI and had cardiac cath with significant CAD and has been transferred to [facility] for eval for CABG per Dr. Kirke Corin.     Assessment & Plan    NSTEMI Significant CAD  --pk troponin 969 --The proximal to mid LAD appeared moderate with diffuse disease but was highly significant by fractional flow reserve evaluation. Treatment of the LAD with PCI would require very long segment of stenting. He also has a large bifurcating diagonal with severe bifurcating disease that makes PCI difficult. In addition, he has severe proximal RCA stenosis. Treatment with PCI and achieving complete revascularization would require at least 4 or 5 stents. Thus, recommended evaluation for CABG  normal EF --on IV heparin and ASA 81 mg  Also on BB and statin high dose.  --heart healthy diet  --will contact [facility]-- they are aware and Dr. Tyrone Sage to see  HTN --lopressor continue  BP 160/72 to 119/56  Tobacco use --down to 2 cigarettes per day recommendation to stop  HLD  --needs lipid panel  Continue statin --on high dose statin.   .   For questions or updates, please contact [facility] Please consult [email or web address] for contact info under        Signed, Nada Boozer, NP  07/07/2020, 8:32 AM    Patient seen and examined.  Agree with above documentation.  On exam, patient is alert and oriented, regular rate and rhythm, no murmurs, lungs CTAB, no LE edema or JVD.  Cath with severe MVD, transferred for CABG evaluation.  Currently chest pain free.  Echo with normal EF.  CT surgery to see.  Little Ishikawa, MD

## 2020-07-08 ENCOUNTER — Inpatient Hospital Stay (HOSPITAL_COMMUNITY): Payer: Self-pay

## 2020-07-08 DIAGNOSIS — Z01810 Encounter for preprocedural cardiovascular examination: Secondary | ICD-10-CM

## 2020-07-08 LAB — COMPREHENSIVE METABOLIC PANEL
ALT: 14 U/L (ref 0–44)
AST: 18 U/L (ref 15–41)
Albumin: 3.1 g/dL — ABNORMAL LOW (ref 3.5–5.0)
Alkaline Phosphatase: 59 U/L (ref 38–126)
Anion gap: 11 (ref 5–15)
BUN: 12 mg/dL (ref 6–20)
CO2: 23 mmol/L (ref 22–32)
Calcium: 8.8 mg/dL — ABNORMAL LOW (ref 8.9–10.3)
Chloride: 104 mmol/L (ref 98–111)
Creatinine, Ser: 0.95 mg/dL (ref 0.61–1.24)
GFR, Estimated: >60 mL/min (ref >60)
Glucose, Bld: 96 mg/dL (ref 70–99)
Potassium: 4 mmol/L (ref 3.5–5.1)
Sodium: 138 mmol/L (ref 135–145)
Total Bilirubin: 0.4 mg/dL (ref 0.3–1.2)
Total Protein: 6 g/dL — ABNORMAL LOW (ref 6.5–8.1)

## 2020-07-08 LAB — TYPE AND SCREEN
ABO/RH(D): O POS
Antibody Screen: NEGATIVE

## 2020-07-08 LAB — BLOOD GAS, ARTERIAL
Acid-base deficit: 0.6 mmol/L (ref 0.0–2.0)
Bicarbonate: 23.7 mmol/L (ref 20.0–28.0)
FIO2: 21
O2 Saturation: 95 %
Patient temperature: 36.4
pCO2 arterial: 38.2 mmHg (ref 32.0–48.0)
pH, Arterial: 7.405 (ref 7.350–7.450)
pO2, Arterial: 74.4 mmHg — ABNORMAL LOW (ref 83.0–108.0)

## 2020-07-08 LAB — CBC
HCT: 41.5 % (ref 39.0–52.0)
Hemoglobin: 14.2 g/dL (ref 13.0–17.0)
MCH: 30.1 pg (ref 26.0–34.0)
MCHC: 34.2 g/dL (ref 30.0–36.0)
MCV: 87.9 fL (ref 80.0–100.0)
Platelets: 239 10*3/uL (ref 150–400)
RBC: 4.72 MIL/uL (ref 4.22–5.81)
RDW: 13.1 % (ref 11.5–15.5)
WBC: 8.8 10*3/uL (ref 4.0–10.5)
nRBC: 0 % (ref 0.0–0.2)

## 2020-07-08 LAB — LIPID PANEL
Cholesterol: 182 mg/dL (ref 0–200)
HDL: 34 mg/dL — ABNORMAL LOW (ref >40)
LDL Cholesterol: 120 mg/dL — ABNORMAL HIGH (ref 0–99)
Total CHOL/HDL Ratio: 5.4 RATIO
Triglycerides: 141 mg/dL (ref <150)
VLDL: 28 mg/dL (ref 0–40)

## 2020-07-08 LAB — HEMOGLOBIN A1C
Hgb A1c MFr Bld: 5.7 % — ABNORMAL HIGH (ref 4.8–5.6)
Mean Plasma Glucose: 116.89 mg/dL

## 2020-07-08 LAB — HEPARIN LEVEL (UNFRACTIONATED): Heparin Unfractionated: 0.38 IU/mL (ref 0.30–0.70)

## 2020-07-08 LAB — PROTIME-INR
INR: 0.9 (ref 0.8–1.2)
Prothrombin Time: 12.2 seconds (ref 11.4–15.2)

## 2020-07-08 LAB — ABO/RH: ABO/RH(D): O POS

## 2020-07-08 MED ORDER — DEXMEDETOMIDINE HCL IN NACL 400 MCG/100ML IV SOLN
0.1000 ug/kg/h | INTRAVENOUS | Status: AC
  Administered 2020-07-09: .7 ug/kg/h via INTRAVENOUS
  Filled 2020-07-08: qty 100

## 2020-07-08 MED ORDER — VANCOMYCIN HCL 1250 MG/250ML IV SOLN
1250.0000 mg | INTRAVENOUS | Status: DC
  Filled 2020-07-08: qty 250

## 2020-07-08 MED ORDER — TRANEXAMIC ACID 1000 MG/10ML IV SOLN
1.5000 mg/kg/h | INTRAVENOUS | Status: DC
  Filled 2020-07-08: qty 25

## 2020-07-08 MED ORDER — INSULIN REGULAR(HUMAN) IN NACL 100-0.9 UT/100ML-% IV SOLN
INTRAVENOUS | Status: DC
  Filled 2020-07-08: qty 100

## 2020-07-08 MED ORDER — SODIUM CHLORIDE 0.9 % IV SOLN
750.0000 mg | INTRAVENOUS | Status: DC
  Filled 2020-07-08: qty 750

## 2020-07-08 MED ORDER — CHLORHEXIDINE GLUCONATE CLOTH 2 % EX PADS
6.0000 | MEDICATED_PAD | Freq: Once | CUTANEOUS | Status: AC
  Administered 2020-07-08: 6 via TOPICAL

## 2020-07-08 MED ORDER — PLASMA-LYTE 148 IV SOLN
INTRAVENOUS | Status: DC
  Filled 2020-07-08 (×2): qty 2.5

## 2020-07-08 MED ORDER — TRANEXAMIC ACID (OHS) PUMP PRIME SOLUTION
2.0000 mg/kg | INTRAVENOUS | Status: DC
  Filled 2020-07-08: qty 1.58

## 2020-07-08 MED ORDER — MAGNESIUM SULFATE 50 % IJ SOLN
40.0000 meq | INTRAMUSCULAR | Status: DC
  Filled 2020-07-08: qty 9.85

## 2020-07-08 MED ORDER — BISACODYL 5 MG PO TBEC: 5.0000 mg | DELAYED_RELEASE_TABLET | Freq: Once | ORAL | Status: DC

## 2020-07-08 MED ORDER — MILRINONE LACTATE IN DEXTROSE 20-5 MG/100ML-% IV SOLN
0.3000 ug/kg/min | INTRAVENOUS | Status: DC
  Filled 2020-07-08: qty 100

## 2020-07-08 MED ORDER — NOREPINEPHRINE 4 MG/250ML-% IV SOLN
0.0000 ug/min | INTRAVENOUS | Status: DC
  Filled 2020-07-08: qty 250

## 2020-07-08 MED ORDER — CHLORHEXIDINE GLUCONATE 0.12 % MT SOLN
15.0000 mL | Freq: Once | OROMUCOSAL | Status: AC
  Administered 2020-07-09: 15 mL via OROMUCOSAL
  Filled 2020-07-08: qty 15

## 2020-07-08 MED ORDER — SODIUM CHLORIDE 0.9 % IV SOLN
INTRAVENOUS | Status: DC
  Filled 2020-07-08: qty 30

## 2020-07-08 MED ORDER — EPINEPHRINE HCL 5 MG/250ML IV SOLN IN NS
0.0000 ug/min | INTRAVENOUS | Status: DC
  Filled 2020-07-08: qty 250

## 2020-07-08 MED ORDER — PHENYLEPHRINE HCL-NACL 20-0.9 MG/250ML-% IV SOLN
30.0000 ug/min | INTRAVENOUS | Status: DC
  Filled 2020-07-08: qty 250

## 2020-07-08 MED ORDER — CHLORHEXIDINE GLUCONATE CLOTH 2 % EX PADS
6.0000 | MEDICATED_PAD | Freq: Once | CUTANEOUS | Status: AC
  Administered 2020-07-09: 6 via TOPICAL

## 2020-07-08 MED ORDER — METOPROLOL TARTRATE 12.5 MG HALF TABLET
12.5000 mg | ORAL_TABLET | Freq: Once | ORAL | Status: AC
  Administered 2020-07-09: 12.5 mg via ORAL
  Filled 2020-07-08: qty 1

## 2020-07-08 MED ORDER — SODIUM CHLORIDE 0.9 % IV SOLN
1.5000 g | INTRAVENOUS | Status: DC
  Filled 2020-07-08: qty 1.5

## 2020-07-08 MED ORDER — TEMAZEPAM 15 MG PO CAPS
15.0000 mg | ORAL_CAPSULE | Freq: Once | ORAL | Status: AC | PRN
  Administered 2020-07-08: 15 mg via ORAL
  Filled 2020-07-08: qty 1

## 2020-07-08 MED ORDER — TRANEXAMIC ACID (OHS) BOLUS VIA INFUSION
15.0000 mg/kg | INTRAVENOUS | Status: AC
  Administered 2020-07-09: 1185 mg via INTRAVENOUS
  Filled 2020-07-08: qty 1185

## 2020-07-08 MED ORDER — POTASSIUM CHLORIDE 2 MEQ/ML IV SOLN
80.0000 meq | INTRAVENOUS | Status: DC
  Filled 2020-07-08: qty 40

## 2020-07-08 MED ORDER — NITROGLYCERIN IN D5W 200-5 MCG/ML-% IV SOLN
2.0000 ug/min | INTRAVENOUS | Status: DC
  Filled 2020-07-08: qty 250

## 2020-07-08 NOTE — Plan of Care (Signed)

## 2020-07-08 NOTE — Anesthesia Preprocedure Evaluation (Addendum)
Anesthesia Evaluation  Patient identified by MRN, date of birth, ID band Patient awake    Reviewed: Allergy & Precautions, NPO status , Patient's Chart, lab work & pertinent test results, reviewed documented beta blocker date and time   Airway Mallampati: II  TM Distance: >3 FB Neck ROM: Full    Dental  (+) Dental Advisory Given, Missing   Pulmonary Current Smoker and Patient abstained from smoking.,    Pulmonary exam normal breath sounds clear to auscultation       Cardiovascular hypertension, Pt. on home beta blockers + angina + CAD, + Past MI and + Cardiac Stents  Normal cardiovascular exam Rhythm:Regular Rate:Normal  Echo 07/06/20: IMPRESSIONS    1. Left ventricular ejection fraction, by estimation, is 55 to 60%. The  left ventricle has normal function. The left ventricle has no regional  wall motion abnormalities. There is mild left ventricular hypertrophy.  Left ventricular diastolic parameters  were normal. The average left ventricular global longitudinal strain is  -15.2 %. The global longitudinal strain is abnormal.  2. Right ventricular systolic function is normal. The right ventricular  size is normal.  3. The mitral valve is normal in structure. No evidence of mitral valve  regurgitation. No evidence of mitral stenosis.  4. The aortic valve is normal in structure. Aortic valve regurgitation is  not visualized. No aortic stenosis is present.  5. The inferior vena cava is normal in size with greater than 50%  respiratory variability, suggesting right atrial pressure of 3 mmHg.   Neuro/Psych negative neurological ROS     GI/Hepatic negative GI ROS, Neg liver ROS,   Endo/Other  negative endocrine ROSObesity   Renal/GU negative Renal ROS     Musculoskeletal negative musculoskeletal ROS (+)   Abdominal   Peds  Hematology negative hematology ROS (+)   Anesthesia Other Findings    Reproductive/Obstetrics                            Anesthesia Physical Anesthesia Plan  ASA: IV  Anesthesia Plan: General   Post-op Pain Management:    Induction: Intravenous  PONV Risk Score and Plan: 1 and Midazolam and Treatment may vary due to age or medical condition  Airway Management Planned: Oral ETT  Additional Equipment: Arterial line, CVP, PA Cath, TEE and Ultrasound Guidance Line Placement  Intra-op Plan:   Post-operative Plan: Post-operative intubation/ventilation  Informed Consent: I have reviewed the patients History and Physical, chart, labs and discussed the procedure including the risks, benefits and alternatives for the proposed anesthesia with the patient or authorized representative who has indicated his/her understanding and acceptance.     Dental advisory given  Plan Discussed with: CRNA  Anesthesia Plan Comments:        Anesthesia Quick Evaluation

## 2020-07-08 NOTE — Progress Notes (Signed)
Progress Note  Patient Name: Jeffery Sweeney Date of Encounter: 07/08/2020  [facility] Cardiologist: Lorine Bears, MD   Subjective   Denies any chest pain or dyspnea  Inpatient Medications    Scheduled Meds: . aspirin EC  81 mg Oral Daily  . atorvastatin  80 mg Oral Daily  . [START ON 07/09/2020] epinephrine  0-10 mcg/min Intravenous To OR  . [START ON 07/09/2020] heparin-papaverine-plasmalyte irrigation   Irrigation To OR  . [START ON 07/09/2020] insulin   Intravenous To OR  . [START ON 07/09/2020] magnesium sulfate  40 mEq Other To OR  . metoprolol tartrate  12.5 mg Oral BID  . [START ON 07/09/2020] phenylephrine  30-200 mcg/min Intravenous To OR  . [START ON 07/09/2020] potassium chloride  80 mEq Other To OR  . [START ON 07/09/2020] tranexamic acid  15 mg/kg Intravenous To OR  . [START ON 07/09/2020] tranexamic acid  2 mg/kg Intracatheter To OR   Continuous Infusions: . [START ON 07/09/2020] cefUROXime (ZINACEF)  IV    . [START ON 07/09/2020] cefUROXime (ZINACEF)  IV    . [START ON 07/09/2020] dexmedetomidine    . [START ON 07/09/2020] heparin 30,000 units/NS 1000 mL solution for CELLSAVER    . heparin 1,400 Units/hr (07/08/20 0419)  . [START ON 07/09/2020] milrinone    . [START ON 07/09/2020] nitroGLYCERIN    . [START ON 07/09/2020] norepinephrine    . [START ON 07/09/2020] tranexamic acid (CYKLOKAPRON) infusion ([facility])    . [START ON 07/09/2020] vancomycin     PRN Meds: acetaminophen, nitroGLYCERIN, ondansetron (ZOFRAN) IV   Vital Signs    Vitals:   07/07/20 1522 07/07/20 2038 07/08/20 0500 07/08/20 1105  BP: 107/77 124/85 125/72 121/80  Pulse: 76 80  76  Resp: 18 16 16    Temp: (!) 97.3 F (36.3 C) 98.1 F (36.7 C) 97.7 F (36.5 C)   TempSrc: Oral Oral Oral   SpO2: (!) 8% 99% 94%   Weight:   79 kg     Intake/Output Summary (Last 24 hours) at 07/08/2020 1156 Last data filed at 07/08/2020 1000 Gross per 24 hour  Intake 865.8 ml  Output 650 ml  Net 215.8 ml    Last 3 Weights 07/08/2020 07/06/2020 07/05/2020  Weight (lbs) 174 lb 2.6 oz 172 lb 2.9 oz 173 lb 3.2 oz  Weight (kg) 79 kg 78.1 kg 78.563 kg      Telemetry    SR - Personally Reviewed  ECG    No new - Personally Reviewed  Physical Exam   GEN: No acute distress.   Neck: No JVD Cardiac: RRR, no murmurs, rubs, or gallops Respiratory: Clear to auscultation bilaterally. GI: Soft, nontender, non-distended  MS: No edema; No deformity. Neuro:  Nonfocal  Psych: Normal affect   Labs    High Sensitivity Troponin:   Recent Labs  Lab 06/28/20 1517 06/28/20 1748 07/05/20 2037 07/05/20 2236 07/06/20 0608  TROPONINIHS 3 3 25* 76* 969*      Chemistry Recent Labs  Lab 07/05/20 2037 07/06/20 0423 07/07/20 0309 07/08/20 0455  NA 139 140 137 138  K 3.8 4.0 3.8 4.0  CL 108 106 106 104  CO2 22 26 24 23   GLUCOSE 113* 94 89 96  BUN 18 17 11 12   CREATININE 0.89 0.95 0.89 0.95  CALCIUM 10.3 9.8 8.7* 8.8*  PROT 7.6  --   --  6.0*  ALBUMIN 4.1  --   --  3.1*  AST 18  --   --  18  ALT 16  --   --  14  ALKPHOS 82  --   --  59  BILITOT 1.0  --   --  0.4  GFRNONAA >60 >60 >60 >60  ANIONGAP 9 8 7 11      Hematology Recent Labs  Lab 07/06/20 0423 07/07/20 0309 07/08/20 0455  WBC 8.1 7.8 8.8  RBC 4.72 4.56 4.72  HGB 13.9 14.0 14.2  HCT 41.7 40.4 41.5  MCV 88.3 88.6 87.9  MCH 29.4 30.7 30.1  MCHC 33.3 34.7 34.2  RDW 13.2 13.1 13.1  PLT 238 228 239    BNP Recent Labs  Lab 07/05/20 2037  BNP 17.3     DDimer No results for input(s): DDIMER in the last 168 hours.   Radiology    CARDIAC CATHETERIZATION  Result Date: 07/06/2020  The left ventricular systolic function is normal.  LV end diastolic pressure is normal.  The left ventricular ejection fraction is 55-65% by visual estimate.  Prox RCA lesion is 80% stenosed.  Dist RCA lesion is 30% stenosed.  RPDA lesion is 90% stenosed.  Prox LAD to Mid LAD lesion is 60% stenosed.  1st Diag lesion is 90% stenosed.   Lat 1st Diag lesion is 90% stenosed.  Mid Cx to Dist Cx lesion is 90% stenosed.  2nd Diag lesion is 80% stenosed.  2nd Mrg lesion is 90% stenosed.  1.  Significant three-vessel coronary artery disease.  The LAD disease appears moderate angiographically.  However, it is diffusely diseased in the proximal mid segment and was highly significant by fractional flow reserve evaluation with an IFR ratio of 0.74.  In addition, there is severe stenosis in the bifurcating first diagonal, second diagonal and right coronary artery.  The left circumflex is also significantly diseased in the mid to distal segment but OM branches are small and likely not graftable. 2.  Normal LV systolic function and normal left ventricular end-diastolic pressure. Recommendations: [facility]. Resume heparin drip 6 hours after sheath pull.   VAS US DOPPLER PRE CABG  Result Date: 07/08/2020 PREOPERATIVE VASCULAR EVALUATION  Indications:      Pre-CABG. Risk Factors:     Hypertension, current smoker, coronary artery disease. Other Factors:    History of stents placed 2017. Comparison Study: No prior study on file Performing Technologist: Sherren KernsKanady, Candace RVS  Examination Guidelines: A complete evaluation includes B-mode imaging, spectral Doppler, color Doppler, and power Doppler as needed of all accessible portions of each vessel. Bilateral testing is considered an integral part of a complete examination. Limited examinations for reoccurring indications may be performed as noted.  Right Carotid Findings: +----------+--------+--------+--------+------------+------------------+           PSV cm/sEDV cm/sStenosisDescribe    Comments           +----------+--------+--------+--------+------------+------------------+ CCA Prox  85      30                          intimal thickening +----------+--------+--------+--------+------------+------------------+ CCA Distal95      31                           intimal thickening +----------+--------+--------+--------+------------+------------------+ ICA Prox  75      27              heterogenous                   +----------+--------+--------+--------+------------+------------------+  ICA Distal64      24                                             +----------+--------+--------+--------+------------+------------------+ ECA       106     24                                             +----------+--------+--------+--------+------------+------------------+ Portions of this table do not appear on this page. +----------+--------+-------+--------+------------+           PSV cm/sEDV cmsDescribeArm Pressure +----------+--------+-------+--------+------------+ [id number]                                 +----------+--------+-------+--------+------------+ +---------+--------+--+--------+--+ VertebralPSV cm/s42EDV cm/s13 +---------+--------+--+--------+--+ Left Carotid Findings: +----------+--------+--------+--------+------------+------------------+           PSV cm/sEDV cm/sStenosisDescribe    Comments           +----------+--------+--------+--------+------------+------------------+ CCA Prox  94      26                          intimal thickening +----------+--------+--------+--------+------------+------------------+ CCA Distal83      28                          intimal thickening +----------+--------+--------+--------+------------+------------------+ ICA Prox  106     27              heterogenous                   +----------+--------+--------+--------+------------+------------------+ ICA Distal119     50                                             +----------+--------+--------+--------+------------+------------------+ ECA       103     24                                             +----------+--------+--------+--------+------------+------------------+  +----------+--------+--------+--------+------------+ SubclavianPSV cm/sEDV cm/sDescribeArm Pressure +----------+--------+--------+--------+------------+           68                                   +----------+--------+--------+--------+------------+ +---------+--------+--+--------+--+ VertebralPSV cm/s55EDV cm/s21 +---------+--------+--+--------+--+  ABI Findings: +--------+------------------+-----+-----------+--------+ Right   Rt Pressure (mmHg)IndexWaveform   Comment  +--------+------------------+-----+-----------+--------+ [id number]                    multiphasic         +--------+------------------+-----+-----------+--------+ PTA     118               1.01 multiphasic         +--------+------------------+-----+-----------+--------+ DP      103               0.88 multiphasic         +--------+------------------+-----+-----------+--------+ +--------+------------------+-----+-----------+-------+  Left    Lt Pressure (mmHg)IndexWaveform   Comment +--------+------------------+-----+-----------+-------+ [id number]                    multiphasic        +--------+------------------+-----+-----------+-------+ PTA     137               1.17 multiphasic        +--------+------------------+-----+-----------+-------+ DP      118               1.01 multiphasic        +--------+------------------+-----+-----------+-------+ +-------+---------------+----------------+ ABI/TBIToday's ABI/TBIPrevious ABI/TBI +-------+---------------+----------------+ Right  1.01                            +-------+---------------+----------------+ Left   1.17                            +-------+---------------+----------------+  Right Doppler Findings: +--------+--------+-----+-----------+--------+ Site    PressureIndexDoppler    Comments +--------+--------+-----+-----------+--------+ [id number]          multiphasic          +--------+--------+-----+-----------+--------+ Radial               multiphasic         +--------+--------+-----+-----------+--------+ Ulnar                multiphasic         +--------+--------+-----+-----------+--------+  Left Doppler Findings: +--------+--------+-----+-----------+--------+ Site    PressureIndexDoppler    Comments +--------+--------+-----+-----------+--------+ [id number]          multiphasic         +--------+--------+-----+-----------+--------+ Radial               multiphasic         +--------+--------+-----+-----------+--------+ Ulnar                multiphasic         +--------+--------+-----+-----------+--------+ Technologist Notes: Patient has history of baseball sized cyst removed from left wrist when he was 60 years old.  Summary: Right Carotid: The extracranial vessels were near-normal with only minimal wall                thickening or plaque. Left Carotid: The extracranial vessels were near-normal with only minimal wall               thickening or plaque. Vertebrals: Bilateral vertebral arteries demonstrate antegrade flow. Right ABI: Resting right ankle-brachial index is within normal range. No evidence of significant right lower extremity arterial disease. Left ABI: Resting left ankle-brachial index is within normal range. No evidence of significant left lower extremity arterial disease. Right Upper Extremity: Doppler waveforms remain within normal limits with right radial compression. Doppler waveforms decrease >50% with right ulnar compression. Left Upper Extremity: Doppler waveforms decrease >50% with left radial compression. Doppler waveforms decrease >50% with left ulnar compression.    Preliminary     Cardiac Studies   Echo 07/06/20 1. Left ventricular ejection fraction, by estimation, is 55 to 60%. The  left ventricle has normal function. The left ventricle has no regional  wall motion abnormalities. There is mild left ventricular  hypertrophy.  Left ventricular diastolic parameters  were normal. The average left ventricular global longitudinal strain is  -15.2 %. The global longitudinal strain is abnormal.  2. Right ventricular systolic function is normal. The right ventricular  size is normal.  3. The mitral valve is  normal in structure. No evidence of mitral valve  regurgitation. No evidence of mitral stenosis.  4. The aortic valve is normal in structure. Aortic valve regurgitation is  not visualized. No aortic stenosis is present.  5. The inferior vena cava is normal in size with greater than 50%  respiratory variability, suggesting right atrial pressure of 3 mmHg.   LHC 07/06/2020  The left ventricular systolic function is normal.  LV end diastolic pressure is normal.  The left ventricular ejection fraction is 55-65% by visual estimate.  Prox RCA lesion is 80% stenosed.  Dist RCA lesion is 30% stenosed.  RPDA lesion is 90% stenosed.  Prox LAD to Mid LAD lesion is 60% stenosed.  1st Diag lesion is 90% stenosed.  Lat 1st Diag lesion is 90% stenosed.  Mid Cx to Dist Cx lesion is 90% stenosed.  2nd Diag lesion is 80% stenosed.  2nd Mrg lesion is 90% stenosed. 1. Significant three-vessel coronary artery disease. The LAD disease appears moderate angiographically. However, it is diffusely diseased in the proximal mid segment and was highly significant by fractional flow reserve evaluation with an IFR ratio of 0.74. In addition, there is severe stenosis in the bifurcating first diagonal, second diagonal and right coronary artery. The left circumflex is also significantly diseased in the mid to distal segment but OM branches are small and likely not graftable. 2. Normal LV systolic function and normal left ventricular end-diastolic pressure.  Recommendations: I recommend transfer to [facility] for CABG evaluation. Resume heparin drip 6 hours after sheath  pull.  Diagnostic Dominance: Right    Intervention    Patient Profile     60 y.o. 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 Kirke Corin.     Assessment & Plan    NSTEMI Significant CAD  --pk troponin 969 --The proximal to mid LAD appeared moderate with diffuse disease but was highly significant by fractional flow reserve evaluation. Treatment of the LAD with PCI would require very long segment of stenting. He also has a large bifurcating diagonal with severe bifurcating disease that makes PCI difficult. In addition, he has severe proximal RCA stenosis. Treatment with PCI and achieving complete revascularization would require at least 4 or 5 stents. Thus, recommended evaluation for CABG  normal EF --on IV heparin and ASA 81 mg  Also on BB and statin high dose.  --heart healthy diet  --evaluated by Dr. Tyrone Sage, planning CABG tomorrow  HTN --lopressor continue.  BP 121/80 today  Tobacco use --down to 2 cigarettes per day recommendation to stop  HLD  --LDL 120 Continue statin --on high dose statin.   .   For questions or updates, please contact [facility] Please consult [email or web address] for contact info under        Signed, Little Ishikawa, MD  07/08/2020, 11:56 AM

## 2020-07-08 NOTE — Progress Notes (Signed)
[address]       [place] [address]             [phone]                   Procedure(s) (LRB): CORONARY ARTERY BYPASS GRAFTING (CABG)WITH ENDO VEIN HARVEST (N/A) TRANSESOPHAGEAL ECHOCARDIOGRAM (TEE) (N/A)  LOS: 2 days   Subjective: No chest pain , taking to daughter on phone about surgery  Objective: Vital signs in last 24 hours: Patient Vitals for the past 24 hrs:  BP Temp Temp src Pulse Resp SpO2 Weight  07/08/20 1105 121/80 -- -- 76 -- -- --  07/08/20 0500 125/72 97.7 F (36.5 C) Oral -- 16 94 % 79 kg  07/07/20 2038 124/85 98.1 F (36.7 C) Oral 80 16 99 % --  07/07/20 1522 107/77 (!) 97.3 F (36.3 C) Oral 76 18 (!) 8 % --    Filed Weights   07/08/20 0500  Weight: 79 kg    Hemodynamic parameters for last 24 hours:    Intake/Output from previous day: 02/19 0701 - 02/20 0700 In: 865.8 [P.O.:720; I.V.:145.8] Out: 650 [Urine:650] Intake/Output this shift: No intake/output data recorded.  Scheduled Meds: . aspirin EC  81 mg Oral Daily  . atorvastatin  80 mg Oral Daily  . [START ON 07/09/2020] epinephrine  0-10 mcg/min Intravenous To OR  . [START ON 07/09/2020] heparin-papaverine-plasmalyte irrigation   Irrigation To OR  . [START ON 07/09/2020] insulin   Intravenous To OR  . [START ON 07/09/2020] magnesium sulfate  40 mEq Other To OR  . metoprolol tartrate  12.5 mg Oral BID  . [START ON 07/09/2020] phenylephrine  30-200 mcg/min Intravenous To OR  . [START ON 07/09/2020] potassium chloride  80 mEq Other To OR  . [START ON 07/09/2020] tranexamic acid  15 mg/kg Intravenous To OR  . [START ON 07/09/2020] tranexamic acid  2 mg/kg Intracatheter To OR   Continuous Infusions: . [START ON 07/09/2020] cefUROXime (ZINACEF)  IV    . [START ON 07/09/2020] cefUROXime (ZINACEF)  IV    . [START ON 07/09/2020] dexmedetomidine    . [START ON 07/09/2020] heparin 30,000 units/NS 1000 mL solution for CELLSAVER    . heparin 1,400 Units/hr (07/08/20 0419)  . [START  ON 07/09/2020] milrinone    . [START ON 07/09/2020] nitroGLYCERIN    . [START ON 07/09/2020] norepinephrine    . [START ON 07/09/2020] tranexamic acid (CYKLOKAPRON) infusion ([facility])    . [START ON 07/09/2020] vancomycin     PRN Meds:.acetaminophen, nitroGLYCERIN, ondansetron (ZOFRAN) IV  General appearance: alert and cooperative Heart: regular rate and rhythm, S1, S2 normal, no murmur, click, rub or gallop Lungs: clear to auscultation bilaterally Wound: right radial cath side intact  Lab Results: CBC: Recent Labs    07/07/20 0309 07/08/20 0455  WBC 7.8 8.8  HGB 14.0 14.2  HCT 40.4 41.5  PLT 228 239   BMET:  Recent Labs    07/07/20 0309 07/08/20 0455  NA 137 138  K 3.8 4.0  CL 106 104  CO2 24 23  GLUCOSE 89 96  BUN 11 12  CREATININE 0.89 0.95  CALCIUM 8.7* 8.8*    PT/INR:  Recent Labs    07/08/20 0455  LABPROT 12.2  INR 0.9     Radiology CARDIAC CATHETERIZATION  Result Date: 07/06/2020  The left ventricular systolic function is normal.  LV end diastolic pressure is normal.  The left ventricular ejection fraction is 55-65%  by visual estimate.  Prox RCA lesion is 80% stenosed.  Dist RCA lesion is 30% stenosed.  RPDA lesion is 90% stenosed.  Prox LAD to Mid LAD lesion is 60% stenosed.  1st Diag lesion is 90% stenosed.  Lat 1st Diag lesion is 90% stenosed.  Mid Cx to Dist Cx lesion is 90% stenosed.  2nd Diag lesion is 80% stenosed.  2nd Mrg lesion is 90% stenosed.  1.  Significant three-vessel coronary artery disease.  The LAD disease appears moderate angiographically.  However, it is diffusely diseased in the proximal mid segment and was highly significant by fractional flow reserve evaluation with an IFR ratio of 0.74.  In addition, there is severe stenosis in the bifurcating first diagonal, second diagonal and right coronary artery.  The left circumflex is also significantly diseased in the mid to distal segment but OM branches are small and likely not graftable.  2.  Normal LV systolic function and normal left ventricular end-diastolic pressure. Recommendations: I recommend transfer to [facility] for CABG evaluation. Resume heparin drip 6 hours after sheath pull.   VAS US DOPPLER PRE CABG  Result Date: 07/08/2020 PREOPERATIVE VASCULAR EVALUATION  Indications:      Pre-CABG. Risk Factors:     Hypertension, current smoker, coronary artery disease. Other Factors:    History of stents placed 2017. Comparison Study: No prior study on file Performing Technologist: Sherren Kerns RVS  Examination Guidelines: A complete evaluation includes B-mode imaging, spectral Doppler, color Doppler, and power Doppler as needed of all accessible portions of each vessel. Bilateral testing is considered an integral part of a complete examination. Limited examinations for reoccurring indications may be performed as noted.  Right Carotid Findings: +----------+--------+--------+--------+------------+------------------+           PSV cm/sEDV cm/sStenosisDescribe    Comments           +----------+--------+--------+--------+------------+------------------+ CCA Prox  85      30                          intimal thickening +----------+--------+--------+--------+------------+------------------+ CCA Distal95      31                          intimal thickening +----------+--------+--------+--------+------------+------------------+ ICA Prox  75      27              heterogenous                   +----------+--------+--------+--------+------------+------------------+ ICA Distal64      24                                             +----------+--------+--------+--------+------------+------------------+ ECA       106     24                                             +----------+--------+--------+--------+------------+------------------+ Portions of this table do not appear on this page. +----------+--------+-------+--------+------------+           PSV  cm/sEDV cmsDescribeArm Pressure +----------+--------+-------+--------+------------+ [id number]                                 +----------+--------+-------+--------+------------+ +---------+--------+--+--------+--+  VertebralPSV cm/s42EDV cm/s13 +---------+--------+--+--------+--+ Left Carotid Findings: +----------+--------+--------+--------+------------+------------------+           PSV cm/sEDV cm/sStenosisDescribe    Comments           +----------+--------+--------+--------+------------+------------------+ CCA Prox  94      26                          intimal thickening +----------+--------+--------+--------+------------+------------------+ CCA Distal83      28                          intimal thickening +----------+--------+--------+--------+------------+------------------+ ICA Prox  106     27              heterogenous                   +----------+--------+--------+--------+------------+------------------+ ICA Distal119     50                                             +----------+--------+--------+--------+------------+------------------+ ECA       103     24                                             +----------+--------+--------+--------+------------+------------------+ +----------+--------+--------+--------+------------+ SubclavianPSV cm/sEDV cm/sDescribeArm Pressure +----------+--------+--------+--------+------------+           68                                   +----------+--------+--------+--------+------------+ +---------+--------+--+--------+--+ VertebralPSV cm/s55EDV cm/s21 +---------+--------+--+--------+--+  ABI Findings: +--------+------------------+-----+-----------+--------+ Right   Rt Pressure (mmHg)IndexWaveform   Comment  +--------+------------------+-----+-----------+--------+ [id number]                    multiphasic         +--------+------------------+-----+-----------+--------+ PTA     118                1.01 multiphasic         +--------+------------------+-----+-----------+--------+ DP      103               0.88 multiphasic         +--------+------------------+-----+-----------+--------+ +--------+------------------+-----+-----------+-------+ Left    Lt Pressure (mmHg)IndexWaveform   Comment +--------+------------------+-----+-----------+-------+ [id number]                    multiphasic        +--------+------------------+-----+-----------+-------+ PTA     137               1.17 multiphasic        +--------+------------------+-----+-----------+-------+ DP      118               1.01 multiphasic        +--------+------------------+-----+-----------+-------+ +-------+---------------+----------------+ ABI/TBIToday's ABI/TBIPrevious ABI/TBI +-------+---------------+----------------+ Right  1.01                            +-------+---------------+----------------+ Left   1.17                            +-------+---------------+----------------+  Right  Doppler Findings: +--------+--------+-----+-----------+--------+ Site    PressureIndexDoppler    Comments +--------+--------+-----+-----------+--------+ [id number]          multiphasic         +--------+--------+-----+-----------+--------+ Radial               multiphasic         +--------+--------+-----+-----------+--------+ Ulnar                multiphasic         +--------+--------+-----+-----------+--------+  Left Doppler Findings: +--------+--------+-----+-----------+--------+ Site    PressureIndexDoppler    Comments +--------+--------+-----+-----------+--------+ [id number]          multiphasic         +--------+--------+-----+-----------+--------+ Radial               multiphasic         +--------+--------+-----+-----------+--------+ Ulnar                multiphasic         +--------+--------+-----+-----------+--------+ Technologist Notes: Patient has history  of baseball sized cyst removed from left wrist when he was 60 years old.  Summary: Right Carotid: The extracranial vessels were near-normal with only minimal wall                thickening or plaque. Left Carotid: The extracranial vessels were near-normal with only minimal wall               thickening or plaque. Vertebrals: Bilateral vertebral arteries demonstrate antegrade flow. Right ABI: Resting right ankle-brachial index is within normal range. No evidence of significant right lower extremity arterial disease. Left ABI: Resting left ankle-brachial index is within normal range. No evidence of significant left lower extremity arterial disease. Right Upper Extremity: Doppler waveforms remain within normal limits with right radial compression. Doppler waveforms decrease >50% with right ulnar compression. Left Upper Extremity: Doppler waveforms decrease >50% with left radial compression. Doppler waveforms decrease >50% with left ulnar compression.    Preliminary    Previous surgery notes on left wrist  Assessment/Plan: S/P Procedure(s) (LRB): CORONARY ARTERY BYPASS GRAFTING (CABG)WITH ENDO VEIN HARVEST (N/A) TRANSESOPHAGEAL ECHOCARDIOGRAM (TEE) (N/A)  Plan cabg tomorrow  The goals risks and alternatives of the planned surgical procedure Procedure(s): CORONARY ARTERY BYPASS GRAFTING (CABG)WITH ENDO VEIN HARVEST (N/A) TRANSESOPHAGEAL ECHOCARDIOGRAM (TEE) (N/A)  have been discussed with the patient in detail. The risks of the procedure including death, infection, stroke, myocardial infarction, bleeding, blood transfusion have all been discussed specifically.  I have quoted Jeffery Sweeney a 2 % of perioperative mortality and a complication rate as high as 40 %. The patient's questions have been answered.Jeffery Sweeney is willing  to proceed with the planned procedure.  Delight Ovens MD 07/08/2020 11:17 AM

## 2020-07-08 NOTE — Progress Notes (Signed)
ANTICOAGULATION CONSULT NOTE   Pharmacy Consult for Heparin  Indication: S/P [facility] for CABG eval   No Known Allergies  Patient Measurements: Weight: 79 kg (174 lb 2.6 oz) Heparin Dosing Weight: 76.2 kg   Vital Signs: Temp: 97.7 F (36.5 C) (02/20 0500) Temp Source: Oral (02/20 0500) BP: 125/72 (02/20 0500) Pulse Rate: 80 (02/19 2038)  Labs: Recent Labs    07/05/20 2037 07/05/20 2039 07/05/20 2236 07/06/20 0423 07/06/20 6659 07/06/20 0608 07/07/20 0309 07/07/20 1028 07/07/20 1642 07/08/20 0451 07/08/20 0455  HGB 15.1  --   --  13.9  --   --  14.0  --   --   --  14.2  HCT 44.4  --   --  41.7  --   --  40.4  --   --   --  41.5  PLT 287  --   --  238  --   --  228  --   --   --  239  APTT  --  29  --   --   --   --   --   --   --   --   --   LABPROT  --  12.4  --   --   --   --  12.4  --   --   --  12.2  INR  --  1.0  --   --   --   --  1.0  --   --   --  0.9  HEPARINUNFRC  --   --   --   --  0.15*   < > 0.18* 0.34 0.30 0.38  --   CREATININE 0.89  --   --  0.95  --   --  0.89  --   --   --  0.95  TROPONINIHS 25*  --  76*  --  969*  --   --   --   --   --   --    < > = values in this interval not displayed.    Estimated Creatinine Clearance: 76.8 mL/min (by C-G formula based on SCr of 0.95 mg/dL).  Assessment: Pharmacy consulted to dose heparin in this 60 year old male admitted with ACS/NSTEMI. No prior anticoag noted. S/p cath 2/18. Found to have significant 3v disease, tentative CABG scheduled for 2/21.  Heparin level therapeutic at 0.38 on 1400 units/hr. CBC stable and wnl. No s/sx bleeding reported.  Goal of Therapy:  Heparin level 0.3-0.7 units/ml Monitor platelets by anticoagulation protocol: Yes   Plan:  Continue IV heparin 1400 units/hr Daily heparin level, CBC, monitor s/sx bleeding  Laverna Peace, PharmD PGY-1 Pharmacy Resident 07/08/2020 7:45 AM Please see AMION for all pharmacy numbers

## 2020-07-08 NOTE — Progress Notes (Signed)
VASCULAR LAB    Pre CABG Dopplers have been performed.  See CV proc for preliminary results.   Vale Peraza, RVT 07/08/2020, 9:05 AM

## 2020-07-09 ENCOUNTER — Inpatient Hospital Stay (HOSPITAL_COMMUNITY)
Admission: AD | Disposition: A | Discharge status: Home / Self Care | Payer: Self-pay | Source: Other Acute Inpatient Hospital | Attending: Cardiothoracic Surgery

## 2020-07-09 ENCOUNTER — Inpatient Hospital Stay (HOSPITAL_COMMUNITY): Payer: Self-pay | Admitting: Registered Nurse

## 2020-07-09 ENCOUNTER — Encounter: Payer: Self-pay | Admitting: Cardiovascular Disease

## 2020-07-09 ENCOUNTER — Inpatient Hospital Stay (HOSPITAL_COMMUNITY): Payer: Self-pay

## 2020-07-09 DIAGNOSIS — Z951 Presence of aortocoronary bypass graft: Secondary | ICD-10-CM

## 2020-07-09 HISTORY — PX: TEE WITHOUT CARDIOVERSION: SHX5443

## 2020-07-09 HISTORY — PX: CORONARY ARTERY BYPASS GRAFT: SHX141

## 2020-07-09 LAB — CBC
HCT: 41.1 % (ref 39.0–52.0)
HCT: 37.8 % — ABNORMAL LOW (ref 39.0–52.0)
HCT: 41.3 % (ref 39.0–52.0)
Hemoglobin: 14 g/dL (ref 13.0–17.0)
Hemoglobin: 12.7 g/dL — ABNORMAL LOW (ref 13.0–17.0)
Hemoglobin: 13.5 g/dL (ref 13.0–17.0)
MCH: 30.4 pg (ref 26.0–34.0)
MCH: 30.5 pg (ref 26.0–34.0)
MCH: 29.6 pg (ref 26.0–34.0)
MCHC: 34.1 g/dL (ref 30.0–36.0)
MCHC: 33.6 g/dL (ref 30.0–36.0)
MCHC: 32.7 g/dL (ref 30.0–36.0)
MCV: 89.2 fL (ref 80.0–100.0)
MCV: 90.6 fL (ref 80.0–100.0)
MCV: 90.6 fL (ref 80.0–100.0)
Platelets: 244 10*3/uL (ref 150–400)
Platelets: 145 10*3/uL — ABNORMAL LOW (ref 150–400)
Platelets: 169 10*3/uL (ref 150–400)
RBC: 4.61 MIL/uL (ref 4.22–5.81)
RBC: 4.17 MIL/uL — ABNORMAL LOW (ref 4.22–5.81)
RBC: 4.56 MIL/uL (ref 4.22–5.81)
RDW: 13 % (ref 11.5–15.5)
RDW: 12.9 % (ref 11.5–15.5)
RDW: 12.8 % (ref 11.5–15.5)
WBC: 8.4 10*3/uL (ref 4.0–10.5)
WBC: 11.4 10*3/uL — ABNORMAL HIGH (ref 4.0–10.5)
WBC: 14.5 10*3/uL — ABNORMAL HIGH (ref 4.0–10.5)
nRBC: 0 % (ref 0.0–0.2)
nRBC: 0 % (ref 0.0–0.2)
nRBC: 0 % (ref 0.0–0.2)

## 2020-07-09 LAB — POCT I-STAT EG7
Acid-Base Excess: 0 mmol/L (ref 0.0–2.0)
Bicarbonate: 25 mmol/L (ref 20.0–28.0)
Calcium, Ion: 1.08 mmol/L — ABNORMAL LOW (ref 1.15–1.40)
HCT: 32 % — ABNORMAL LOW (ref 39.0–52.0)
Hemoglobin: 10.9 g/dL — ABNORMAL LOW (ref 13.0–17.0)
O2 Saturation: 81 %
Potassium: 4.4 mmol/L (ref 3.5–5.1)
Sodium: 140 mmol/L (ref 135–145)
TCO2: 26 mmol/L (ref 22–32)
pCO2, Ven: 43.1 mmHg — ABNORMAL LOW (ref 44.0–60.0)
pH, Ven: 7.371 (ref 7.250–7.430)
pO2, Ven: 47 mmHg — ABNORMAL HIGH (ref 32.0–45.0)

## 2020-07-09 LAB — POCT I-STAT 7, (LYTES, BLD GAS, ICA,H+H)
Acid-Base Excess: 2 mmol/L (ref 0.0–2.0)
Acid-Base Excess: 2 mmol/L (ref 0.0–2.0)
Acid-base deficit: 1 mmol/L (ref 0.0–2.0)
Acid-base deficit: 3 mmol/L — ABNORMAL HIGH (ref 0.0–2.0)
Acid-base deficit: 3 mmol/L — ABNORMAL HIGH (ref 0.0–2.0)
Acid-base deficit: 3 mmol/L — ABNORMAL HIGH (ref 0.0–2.0)
Acid-base deficit: 3 mmol/L — ABNORMAL HIGH (ref 0.0–2.0)
Acid-base deficit: 2 mmol/L (ref 0.0–2.0)
Acid-base deficit: 3 mmol/L — ABNORMAL HIGH (ref 0.0–2.0)
Bicarbonate: 29.7 mmol/L — ABNORMAL HIGH (ref 20.0–28.0)
Bicarbonate: 26.5 mmol/L (ref 20.0–28.0)
Bicarbonate: 24.4 mmol/L (ref 20.0–28.0)
Bicarbonate: 24.4 mmol/L (ref 20.0–28.0)
Bicarbonate: 25.1 mmol/L (ref 20.0–28.0)
Bicarbonate: 24 mmol/L (ref 20.0–28.0)
Bicarbonate: 25.4 mmol/L (ref 20.0–28.0)
Bicarbonate: 24.5 mmol/L (ref 20.0–28.0)
Bicarbonate: 23.5 mmol/L (ref 20.0–28.0)
Calcium, Ion: 1.26 mmol/L (ref 1.15–1.40)
Calcium, Ion: 1.03 mmol/L — ABNORMAL LOW (ref 1.15–1.40)
Calcium, Ion: 1.02 mmol/L — ABNORMAL LOW (ref 1.15–1.40)
Calcium, Ion: 1.1 mmol/L — ABNORMAL LOW (ref 1.15–1.40)
Calcium, Ion: 1.16 mmol/L (ref 1.15–1.40)
Calcium, Ion: 1.15 mmol/L (ref 1.15–1.40)
Calcium, Ion: 1.15 mmol/L (ref 1.15–1.40)
Calcium, Ion: 1.12 mmol/L — ABNORMAL LOW (ref 1.15–1.40)
Calcium, Ion: 1.12 mmol/L — ABNORMAL LOW (ref 1.15–1.40)
HCT: 42 % (ref 39.0–52.0)
HCT: 30 % — ABNORMAL LOW (ref 39.0–52.0)
HCT: 29 % — ABNORMAL LOW (ref 39.0–52.0)
HCT: 33 % — ABNORMAL LOW (ref 39.0–52.0)
HCT: 37 % — ABNORMAL LOW (ref 39.0–52.0)
HCT: 39 % (ref 39.0–52.0)
HCT: 41 % (ref 39.0–52.0)
HCT: 40 % (ref 39.0–52.0)
HCT: 38 % — ABNORMAL LOW (ref 39.0–52.0)
Hemoglobin: 14.3 g/dL (ref 13.0–17.0)
Hemoglobin: 10.2 g/dL — ABNORMAL LOW (ref 13.0–17.0)
Hemoglobin: 9.9 g/dL — ABNORMAL LOW (ref 13.0–17.0)
Hemoglobin: 11.2 g/dL — ABNORMAL LOW (ref 13.0–17.0)
Hemoglobin: 12.6 g/dL — ABNORMAL LOW (ref 13.0–17.0)
Hemoglobin: 13.3 g/dL (ref 13.0–17.0)
Hemoglobin: 13.9 g/dL (ref 13.0–17.0)
Hemoglobin: 13.6 g/dL (ref 13.0–17.0)
Hemoglobin: 12.9 g/dL — ABNORMAL LOW (ref 13.0–17.0)
O2 Saturation: 100 %
O2 Saturation: 100 %
O2 Saturation: 100 %
O2 Saturation: 97 %
O2 Saturation: 88 %
O2 Saturation: 93 %
O2 Saturation: 89 %
O2 Saturation: 92 %
O2 Saturation: 96 %
Patient temperature: 36.1
Patient temperature: 36.1
Patient temperature: 36.2
Patient temperature: 36.5
Patient temperature: 36.7
Potassium: 4 mmol/L (ref 3.5–5.1)
Potassium: 4.4 mmol/L (ref 3.5–5.1)
Potassium: 5.7 mmol/L — ABNORMAL HIGH (ref 3.5–5.1)
Potassium: 4.3 mmol/L (ref 3.5–5.1)
Potassium: 4.1 mmol/L (ref 3.5–5.1)
Potassium: 4.1 mmol/L (ref 3.5–5.1)
Potassium: 4.5 mmol/L (ref 3.5–5.1)
Potassium: 4.3 mmol/L (ref 3.5–5.1)
Potassium: 4.1 mmol/L (ref 3.5–5.1)
Sodium: 140 mmol/L (ref 135–145)
Sodium: 139 mmol/L (ref 135–145)
Sodium: 134 mmol/L — ABNORMAL LOW (ref 135–145)
Sodium: 139 mmol/L (ref 135–145)
Sodium: 140 mmol/L (ref 135–145)
Sodium: 139 mmol/L (ref 135–145)
Sodium: 139 mmol/L (ref 135–145)
Sodium: 138 mmol/L (ref 135–145)
Sodium: 137 mmol/L (ref 135–145)
TCO2: 31 mmol/L (ref 22–32)
TCO2: 28 mmol/L (ref 22–32)
TCO2: 26 mmol/L (ref 22–32)
TCO2: 26 mmol/L (ref 22–32)
TCO2: 27 mmol/L (ref 22–32)
TCO2: 26 mmol/L (ref 22–32)
TCO2: 27 mmol/L (ref 22–32)
TCO2: 26 mmol/L (ref 22–32)
TCO2: 25 mmol/L (ref 22–32)
pCO2 arterial: 56.9 mmHg — ABNORMAL HIGH (ref 32.0–48.0)
pCO2 arterial: 41.4 mmHg (ref 32.0–48.0)
pCO2 arterial: 40.5 mmHg (ref 32.0–48.0)
pCO2 arterial: 51 mmHg — ABNORMAL HIGH (ref 32.0–48.0)
pCO2 arterial: 55.5 mmHg — ABNORMAL HIGH (ref 32.0–48.0)
pCO2 arterial: 47.5 mmHg (ref 32.0–48.0)
pCO2 arterial: 53.9 mmHg — ABNORMAL HIGH (ref 32.0–48.0)
pCO2 arterial: 47.3 mmHg (ref 32.0–48.0)
pCO2 arterial: 46.4 mmHg (ref 32.0–48.0)
pH, Arterial: 7.325 — ABNORMAL LOW (ref 7.350–7.450)
pH, Arterial: 7.415 (ref 7.350–7.450)
pH, Arterial: 7.388 (ref 7.350–7.450)
pH, Arterial: 7.287 — ABNORMAL LOW (ref 7.350–7.450)
pH, Arterial: 7.259 — ABNORMAL LOW (ref 7.350–7.450)
pH, Arterial: 7.307 — ABNORMAL LOW (ref 7.350–7.450)
pH, Arterial: 7.276 — ABNORMAL LOW (ref 7.350–7.450)
pH, Arterial: 7.32 — ABNORMAL LOW (ref 7.350–7.450)
pH, Arterial: 7.311 — ABNORMAL LOW (ref 7.350–7.450)
pO2, Arterial: 475 mmHg — ABNORMAL HIGH (ref 83.0–108.0)
pO2, Arterial: 437 mmHg — ABNORMAL HIGH (ref 83.0–108.0)
pO2, Arterial: 343 mmHg — ABNORMAL HIGH (ref 83.0–108.0)
pO2, Arterial: 99 mmHg (ref 83.0–108.0)
pO2, Arterial: 61 mmHg — ABNORMAL LOW (ref 83.0–108.0)
pO2, Arterial: 70 mmHg — ABNORMAL LOW (ref 83.0–108.0)
pO2, Arterial: 63 mmHg — ABNORMAL LOW (ref 83.0–108.0)
pO2, Arterial: 68 mmHg — ABNORMAL LOW (ref 83.0–108.0)
pO2, Arterial: 87 mmHg (ref 83.0–108.0)

## 2020-07-09 LAB — BASIC METABOLIC PANEL
Anion gap: 12 (ref 5–15)
Anion gap: 7 (ref 5–15)
BUN: 12 mg/dL (ref 6–20)
BUN: 11 mg/dL (ref 6–20)
CO2: 23 mmol/L (ref 22–32)
CO2: 24 mmol/L (ref 22–32)
Calcium: 8.9 mg/dL (ref 8.9–10.3)
Calcium: 7.8 mg/dL — ABNORMAL LOW (ref 8.9–10.3)
Chloride: 104 mmol/L (ref 98–111)
Chloride: 107 mmol/L (ref 98–111)
Creatinine, Ser: 0.98 mg/dL (ref 0.61–1.24)
Creatinine, Ser: 0.86 mg/dL (ref 0.61–1.24)
GFR, Estimated: >60 mL/min (ref >60)
GFR, Estimated: >60 mL/min (ref >60)
Glucose, Bld: 95 mg/dL (ref 70–99)
Glucose, Bld: 125 mg/dL — ABNORMAL HIGH (ref 70–99)
Potassium: 4.1 mmol/L (ref 3.5–5.1)
Potassium: 4.2 mmol/L (ref 3.5–5.1)
Sodium: 139 mmol/L (ref 135–145)
Sodium: 138 mmol/L (ref 135–145)

## 2020-07-09 LAB — POCT I-STAT, CHEM 8
BUN: 13 mg/dL (ref 6–20)
BUN: 11 mg/dL (ref 6–20)
BUN: 12 mg/dL (ref 6–20)
BUN: 11 mg/dL (ref 6–20)
BUN: 10 mg/dL (ref 6–20)
Calcium, Ion: 1.32 mmol/L (ref 1.15–1.40)
Calcium, Ion: 1.1 mmol/L — ABNORMAL LOW (ref 1.15–1.40)
Calcium, Ion: 1.18 mmol/L (ref 1.15–1.40)
Calcium, Ion: 1.1 mmol/L — ABNORMAL LOW (ref 1.15–1.40)
Calcium, Ion: 1.13 mmol/L — ABNORMAL LOW (ref 1.15–1.40)
Chloride: 103 mmol/L (ref 98–111)
Chloride: 100 mmol/L (ref 98–111)
Chloride: 104 mmol/L (ref 98–111)
Chloride: 102 mmol/L (ref 98–111)
Chloride: 102 mmol/L (ref 98–111)
Creatinine, Ser: 0.8 mg/dL (ref 0.61–1.24)
Creatinine, Ser: 0.7 mg/dL (ref 0.61–1.24)
Creatinine, Ser: 0.8 mg/dL (ref 0.61–1.24)
Creatinine, Ser: 0.8 mg/dL (ref 0.61–1.24)
Creatinine, Ser: 0.8 mg/dL (ref 0.61–1.24)
Glucose, Bld: 98 mg/dL (ref 70–99)
Glucose, Bld: 100 mg/dL — ABNORMAL HIGH (ref 70–99)
Glucose, Bld: 99 mg/dL (ref 70–99)
Glucose, Bld: 117 mg/dL — ABNORMAL HIGH (ref 70–99)
Glucose, Bld: 134 mg/dL — ABNORMAL HIGH (ref 70–99)
HCT: 44 % (ref 39.0–52.0)
HCT: 30 % — ABNORMAL LOW (ref 39.0–52.0)
HCT: 39 % (ref 39.0–52.0)
HCT: 30 % — ABNORMAL LOW (ref 39.0–52.0)
HCT: 36 % — ABNORMAL LOW (ref 39.0–52.0)
Hemoglobin: 15 g/dL (ref 13.0–17.0)
Hemoglobin: 10.2 g/dL — ABNORMAL LOW (ref 13.0–17.0)
Hemoglobin: 13.3 g/dL (ref 13.0–17.0)
Hemoglobin: 10.2 g/dL — ABNORMAL LOW (ref 13.0–17.0)
Hemoglobin: 12.2 g/dL — ABNORMAL LOW (ref 13.0–17.0)
Potassium: 4.1 mmol/L (ref 3.5–5.1)
Potassium: 4.7 mmol/L (ref 3.5–5.1)
Potassium: 5 mmol/L (ref 3.5–5.1)
Potassium: 5.6 mmol/L — ABNORMAL HIGH (ref 3.5–5.1)
Potassium: 4.3 mmol/L (ref 3.5–5.1)
Sodium: 141 mmol/L (ref 135–145)
Sodium: 136 mmol/L (ref 135–145)
Sodium: 139 mmol/L (ref 135–145)
Sodium: 136 mmol/L (ref 135–145)
Sodium: 139 mmol/L (ref 135–145)
TCO2: 26 mmol/L (ref 22–32)
TCO2: 25 mmol/L (ref 22–32)
TCO2: 26 mmol/L (ref 22–32)
TCO2: 26 mmol/L (ref 22–32)
TCO2: 23 mmol/L (ref 22–32)

## 2020-07-09 LAB — GLUCOSE, CAPILLARY
Glucose-Capillary: 126 mg/dL — ABNORMAL HIGH (ref 70–99)
Glucose-Capillary: 122 mg/dL — ABNORMAL HIGH (ref 70–99)
Glucose-Capillary: 137 mg/dL — ABNORMAL HIGH (ref 70–99)
Glucose-Capillary: 114 mg/dL — ABNORMAL HIGH (ref 70–99)
Glucose-Capillary: 131 mg/dL — ABNORMAL HIGH (ref 70–99)
Glucose-Capillary: 158 mg/dL — ABNORMAL HIGH (ref 70–99)
Glucose-Capillary: 115 mg/dL — ABNORMAL HIGH (ref 70–99)

## 2020-07-09 LAB — APTT: aPTT: 28 seconds (ref 24–36)

## 2020-07-09 LAB — MAGNESIUM: Magnesium: 3.2 mg/dL — ABNORMAL HIGH (ref 1.7–2.4)

## 2020-07-09 LAB — HEMOGLOBIN AND HEMATOCRIT, BLOOD
HCT: 33.4 % — ABNORMAL LOW (ref 39.0–52.0)
Hemoglobin: 10.8 g/dL — ABNORMAL LOW (ref 13.0–17.0)

## 2020-07-09 LAB — PROTIME-INR
INR: 1.3 — ABNORMAL HIGH (ref 0.8–1.2)
Prothrombin Time: 15.3 seconds — ABNORMAL HIGH (ref 11.4–15.2)

## 2020-07-09 LAB — PLATELET COUNT: Platelets: 198 10*3/uL (ref 150–400)

## 2020-07-09 LAB — HEPARIN LEVEL (UNFRACTIONATED): Heparin Unfractionated: 0.33 IU/mL (ref 0.30–0.70)

## 2020-07-09 SURGERY — CORONARY ARTERY BYPASS GRAFTING (CABG)
Anesthesia: General | Site: Chest

## 2020-07-09 MED ORDER — PLASMA-LYTE 148 IV SOLN
INTRAVENOUS | Status: DC | PRN
  Administered 2020-07-09: 500 mL via INTRAVASCULAR

## 2020-07-09 MED ORDER — LACTATED RINGERS IV SOLN: INTRAVENOUS | Status: DC | PRN

## 2020-07-09 MED ORDER — ROCURONIUM BROMIDE 10 MG/ML (PF) SYRINGE
PREFILLED_SYRINGE | INTRAVENOUS | Status: DC | PRN
  Administered 2020-07-09: 50 mg via INTRAVENOUS
  Administered 2020-07-09: 100 mg via INTRAVENOUS
  Administered 2020-07-09: 50 mg via INTRAVENOUS
  Administered 2020-07-09: 20 mg via INTRAVENOUS
  Administered 2020-07-09: 50 mg via INTRAVENOUS

## 2020-07-09 MED ORDER — ASPIRIN EC 325 MG PO TBEC
325.0000 mg | DELAYED_RELEASE_TABLET | Freq: Every day | ORAL | Status: DC
  Administered 2020-07-10 – 2020-07-11 (×2): 325 mg via ORAL
  Filled 2020-07-09 (×2): qty 1

## 2020-07-09 MED ORDER — CHLORHEXIDINE GLUCONATE CLOTH 2 % EX PADS
6.0000 | MEDICATED_PAD | Freq: Every day | CUTANEOUS | Status: DC
  Administered 2020-07-10: 6 via TOPICAL

## 2020-07-09 MED ORDER — MAGNESIUM SULFATE 4 GM/100ML IV SOLN
4.0000 g | Freq: Once | INTRAVENOUS | Status: AC
  Administered 2020-07-09: 4 g via INTRAVENOUS
  Filled 2020-07-09: qty 100

## 2020-07-09 MED ORDER — ACETAMINOPHEN 650 MG RE SUPP
650.0000 mg | Freq: Once | RECTAL | Status: AC
  Administered 2020-07-09: 650 mg via RECTAL

## 2020-07-09 MED ORDER — SODIUM CHLORIDE 0.9 % IV SOLN: INTRAVENOUS | Status: DC

## 2020-07-09 MED ORDER — LIDOCAINE 5 % EX PTCH
2.0000 | MEDICATED_PATCH | CUTANEOUS | Status: DC
  Administered 2020-07-09 – 2020-07-11 (×3): 2 via TRANSDERMAL
  Filled 2020-07-09 (×3): qty 2

## 2020-07-09 MED ORDER — CHLORHEXIDINE GLUCONATE 0.12% ORAL RINSE (MEDLINE KIT)
15.0000 mL | Freq: Two times a day (BID) | OROMUCOSAL | Status: DC
  Administered 2020-07-09 – 2020-07-14 (×7): 15 mL via OROMUCOSAL

## 2020-07-09 MED ORDER — VANCOMYCIN HCL 1000 MG IV SOLR
INTRAVENOUS | Status: DC | PRN
  Administered 2020-07-09: 1250 mg via INTRAVENOUS

## 2020-07-09 MED ORDER — PHENYLEPHRINE HCL (PRESSORS) 10 MG/ML IV SOLN
INTRAVENOUS | Status: AC
  Filled 2020-07-09: qty 1

## 2020-07-09 MED ORDER — VANCOMYCIN HCL IN DEXTROSE 1-5 GM/200ML-% IV SOLN
1000.0000 mg | Freq: Once | INTRAVENOUS | Status: AC
  Administered 2020-07-09: 1000 mg via INTRAVENOUS
  Filled 2020-07-09: qty 200

## 2020-07-09 MED ORDER — PHENYLEPHRINE 40 MCG/ML (10ML) SYRINGE FOR IV PUSH (FOR BLOOD PRESSURE SUPPORT)
PREFILLED_SYRINGE | INTRAVENOUS | Status: DC | PRN
  Administered 2020-07-09 (×2): 40 ug via INTRAVENOUS
  Administered 2020-07-09: 60 ug via INTRAVENOUS

## 2020-07-09 MED ORDER — POTASSIUM CHLORIDE 10 MEQ/50ML IV SOLN: 10.0000 meq | INTRAVENOUS | Status: AC

## 2020-07-09 MED ORDER — SODIUM CHLORIDE 0.9 % IV SOLN
1.5000 g | Freq: Two times a day (BID) | INTRAVENOUS | Status: AC
  Administered 2020-07-09 – 2020-07-11 (×4): 1.5 g via INTRAVENOUS
  Filled 2020-07-09 (×4): qty 1.5

## 2020-07-09 MED ORDER — SODIUM CHLORIDE 0.9% FLUSH: 3.0000 mL | INTRAVENOUS | Status: DC | PRN

## 2020-07-09 MED ORDER — LACTATED RINGERS IV SOLN: INTRAVENOUS | Status: DC

## 2020-07-09 MED ORDER — CHLORHEXIDINE GLUCONATE 0.12 % MT SOLN
15.0000 mL | OROMUCOSAL | Status: AC
  Administered 2020-07-09: 15 mL via OROMUCOSAL

## 2020-07-09 MED ORDER — PROPOFOL 10 MG/ML IV BOLUS
INTRAVENOUS | Status: DC | PRN
  Administered 2020-07-09: 30 mg via INTRAVENOUS
  Administered 2020-07-09: 70 mg via INTRAVENOUS
  Administered 2020-07-09: 30 mg via INTRAVENOUS

## 2020-07-09 MED ORDER — PHENYLEPHRINE HCL-NACL 20-0.9 MG/250ML-% IV SOLN
0.0000 ug/min | INTRAVENOUS | Status: DC
  Filled 2020-07-09: qty 250

## 2020-07-09 MED ORDER — ONDANSETRON HCL 4 MG/2ML IJ SOLN
4.0000 mg | Freq: Four times a day (QID) | INTRAMUSCULAR | Status: DC | PRN
  Administered 2020-07-09: 4 mg via INTRAVENOUS
  Filled 2020-07-09: qty 2

## 2020-07-09 MED ORDER — PHENYLEPHRINE HCL-NACL 20-0.9 MG/250ML-% IV SOLN
INTRAVENOUS | Status: DC | PRN
  Administered 2020-07-09: 25 ug/min via INTRAVENOUS

## 2020-07-09 MED ORDER — DOCUSATE SODIUM 100 MG PO CAPS
200.0000 mg | ORAL_CAPSULE | Freq: Every day | ORAL | Status: DC
  Administered 2020-07-10 – 2020-07-11 (×2): 200 mg via ORAL
  Filled 2020-07-09 (×2): qty 2

## 2020-07-09 MED ORDER — KETOROLAC TROMETHAMINE 30 MG/ML IJ SOLN
INTRAMUSCULAR | Status: AC
  Filled 2020-07-09: qty 1

## 2020-07-09 MED ORDER — MIDAZOLAM HCL 5 MG/5ML IJ SOLN
INTRAMUSCULAR | Status: DC | PRN
  Administered 2020-07-09 (×2): 1 mg via INTRAVENOUS
  Administered 2020-07-09: 2 mg via INTRAVENOUS

## 2020-07-09 MED ORDER — INSULIN REGULAR(HUMAN) IN NACL 100-0.9 UT/100ML-% IV SOLN
INTRAVENOUS | Status: DC | PRN
  Administered 2020-07-09: .7 [IU]/h via INTRAVENOUS

## 2020-07-09 MED ORDER — SODIUM CHLORIDE 0.45 % IV SOLN: INTRAVENOUS | Status: DC | PRN

## 2020-07-09 MED ORDER — BISACODYL 10 MG RE SUPP: 10.0000 mg | Freq: Every day | RECTAL | Status: DC

## 2020-07-09 MED ORDER — PHENYLEPHRINE 40 MCG/ML (10ML) SYRINGE FOR IV PUSH (FOR BLOOD PRESSURE SUPPORT)
PREFILLED_SYRINGE | INTRAVENOUS | Status: AC
  Filled 2020-07-09: qty 10

## 2020-07-09 MED ORDER — SODIUM CHLORIDE 0.9 % IV SOLN: 250.0000 mL | INTRAVENOUS | Status: DC

## 2020-07-09 MED ORDER — METOPROLOL TARTRATE 25 MG/10 ML ORAL SUSPENSION: 12.5000 mg | Freq: Two times a day (BID) | ORAL | Status: DC

## 2020-07-09 MED ORDER — LEVALBUTEROL HCL 0.63 MG/3ML IN NEBU: 0.6300 mg | INHALATION_SOLUTION | Freq: Three times a day (TID) | RESPIRATORY_TRACT | Status: DC

## 2020-07-09 MED ORDER — ARTIFICIAL TEARS OPHTHALMIC OINT
TOPICAL_OINTMENT | OPHTHALMIC | Status: DC | PRN
  Administered 2020-07-09: 1 via OPHTHALMIC

## 2020-07-09 MED ORDER — DEXMEDETOMIDINE HCL IN NACL 400 MCG/100ML IV SOLN: 0.0000 ug/kg/h | INTRAVENOUS | Status: DC

## 2020-07-09 MED ORDER — INSULIN REGULAR(HUMAN) IN NACL 100-0.9 UT/100ML-% IV SOLN: INTRAVENOUS | Status: DC

## 2020-07-09 MED ORDER — ATORVASTATIN CALCIUM 80 MG PO TABS
80.0000 mg | ORAL_TABLET | Freq: Every day | ORAL | Status: DC
  Administered 2020-07-10 – 2020-07-14 (×5): 80 mg via ORAL
  Filled 2020-07-09 (×5): qty 1

## 2020-07-09 MED ORDER — DOPAMINE-DEXTROSE 3.2-5 MG/ML-% IV SOLN: 0.0000 ug/kg/min | INTRAVENOUS | Status: DC

## 2020-07-09 MED ORDER — ASPIRIN 81 MG PO CHEW
324.0000 mg | CHEWABLE_TABLET | Freq: Every day | ORAL | Status: DC
  Filled 2020-07-09: qty 4

## 2020-07-09 MED ORDER — MIDAZOLAM HCL 2 MG/2ML IJ SOLN: 2.0000 mg | INTRAMUSCULAR | Status: DC | PRN

## 2020-07-09 MED ORDER — ARTIFICIAL TEARS OPHTHALMIC OINT
TOPICAL_OINTMENT | OPHTHALMIC | Status: AC
  Filled 2020-07-09: qty 3.5

## 2020-07-09 MED ORDER — FENTANYL CITRATE (PF) 250 MCG/5ML IJ SOLN
INTRAMUSCULAR | Status: DC | PRN
  Administered 2020-07-09 (×3): 100 ug via INTRAVENOUS
  Administered 2020-07-09: 50 ug via INTRAVENOUS
  Administered 2020-07-09: 100 ug via INTRAVENOUS
  Administered 2020-07-09: 50 ug via INTRAVENOUS
  Administered 2020-07-09: 100 ug via INTRAVENOUS
  Administered 2020-07-09: 200 ug via INTRAVENOUS
  Administered 2020-07-09 (×2): 100 ug via INTRAVENOUS

## 2020-07-09 MED ORDER — ACETAMINOPHEN 160 MG/5ML PO SOLN: 1000.0000 mg | Freq: Four times a day (QID) | ORAL | Status: DC

## 2020-07-09 MED ORDER — MIDAZOLAM HCL (PF) 10 MG/2ML IJ SOLN
INTRAMUSCULAR | Status: AC
  Filled 2020-07-09: qty 2

## 2020-07-09 MED ORDER — LEVALBUTEROL HCL 0.63 MG/3ML IN NEBU
0.6300 mg | INHALATION_SOLUTION | Freq: Four times a day (QID) | RESPIRATORY_TRACT | Status: DC
  Administered 2020-07-09 – 2020-07-11 (×6): 0.63 mg via RESPIRATORY_TRACT
  Filled 2020-07-09 (×7): qty 3

## 2020-07-09 MED ORDER — PROPOFOL 10 MG/ML IV BOLUS
INTRAVENOUS | Status: AC
  Filled 2020-07-09: qty 20

## 2020-07-09 MED ORDER — FAMOTIDINE IN NACL 20-0.9 MG/50ML-% IV SOLN
20.0000 mg | Freq: Two times a day (BID) | INTRAVENOUS | Status: AC
  Administered 2020-07-09 (×2): 20 mg via INTRAVENOUS
  Filled 2020-07-09: qty 50

## 2020-07-09 MED ORDER — NITROGLYCERIN IN D5W 200-5 MCG/ML-% IV SOLN: 0.0000 ug/min | INTRAVENOUS | Status: DC

## 2020-07-09 MED ORDER — TRAMADOL HCL 50 MG PO TABS
50.0000 mg | ORAL_TABLET | ORAL | Status: DC | PRN
  Administered 2020-07-10 – 2020-07-11 (×5): 100 mg via ORAL
  Filled 2020-07-09 (×6): qty 2

## 2020-07-09 MED ORDER — ACETAMINOPHEN 500 MG PO TABS
1000.0000 mg | ORAL_TABLET | Freq: Four times a day (QID) | ORAL | Status: DC
  Administered 2020-07-10 – 2020-07-12 (×9): 1000 mg via ORAL
  Filled 2020-07-09 (×8): qty 2

## 2020-07-09 MED ORDER — SODIUM CHLORIDE 0.9% FLUSH: 10.0000 mL | INTRAVENOUS | Status: DC | PRN

## 2020-07-09 MED ORDER — DEXTROSE 50 % IV SOLN: 0.0000 mL | INTRAVENOUS | Status: DC | PRN

## 2020-07-09 MED ORDER — METOPROLOL TARTRATE 5 MG/5ML IV SOLN: 2.5000 mg | INTRAVENOUS | Status: DC | PRN

## 2020-07-09 MED ORDER — ACETAMINOPHEN 160 MG/5ML PO SOLN: 650.0000 mg | Freq: Once | ORAL | Status: AC

## 2020-07-09 MED ORDER — HEMOSTATIC AGENTS (NO CHARGE) OPTIME
TOPICAL | Status: DC | PRN
Start: 2020-07-09 — End: 2020-07-09
  Administered 2020-07-09 (×2): 1 via TOPICAL

## 2020-07-09 MED ORDER — TRANEXAMIC ACID 1000 MG/10ML IV SOLN: INTRAVENOUS | Status: DC | PRN

## 2020-07-09 MED ORDER — PROTAMINE SULFATE 10 MG/ML IV SOLN
INTRAVENOUS | Status: DC | PRN
  Administered 2020-07-09: 20 mg via INTRAVENOUS
  Administered 2020-07-09: 230 mg via INTRAVENOUS

## 2020-07-09 MED ORDER — OXYCODONE HCL 5 MG PO TABS
5.0000 mg | ORAL_TABLET | ORAL | Status: DC | PRN
  Administered 2020-07-10 – 2020-07-11 (×5): 10 mg via ORAL
  Filled 2020-07-09 (×5): qty 2

## 2020-07-09 MED ORDER — ROCURONIUM BROMIDE 10 MG/ML (PF) SYRINGE
PREFILLED_SYRINGE | INTRAVENOUS | Status: AC
  Filled 2020-07-09: qty 20

## 2020-07-09 MED ORDER — ALBUTEROL SULFATE HFA 108 (90 BASE) MCG/ACT IN AERS
INHALATION_SPRAY | RESPIRATORY_TRACT | Status: DC | PRN
Start: 2020-07-09 — End: 2020-07-09
  Administered 2020-07-09: 2 via RESPIRATORY_TRACT
  Administered 2020-07-09: 4 via RESPIRATORY_TRACT

## 2020-07-09 MED ORDER — DOPAMINE-DEXTROSE 1.6-5 MG/ML-% IV SOLN
INTRAVENOUS | Status: DC | PRN
  Administered 2020-07-09: 3 ug/kg/min via INTRAVENOUS

## 2020-07-09 MED ORDER — ORAL CARE MOUTH RINSE
15.0000 mL | OROMUCOSAL | Status: DC
  Administered 2020-07-09 – 2020-07-10 (×7): 15 mL via OROMUCOSAL

## 2020-07-09 MED ORDER — SODIUM CHLORIDE (PF) 0.9 % IJ SOLN
OROMUCOSAL | Status: DC | PRN
  Administered 2020-07-09 (×2): 4 mL via TOPICAL

## 2020-07-09 MED ORDER — ROCURONIUM BROMIDE 10 MG/ML (PF) SYRINGE
PREFILLED_SYRINGE | INTRAVENOUS | Status: AC
  Filled 2020-07-09: qty 10

## 2020-07-09 MED ORDER — SODIUM CHLORIDE 0.9% FLUSH
3.0000 mL | Freq: Two times a day (BID) | INTRAVENOUS | Status: DC
  Administered 2020-07-10 – 2020-07-11 (×4): 3 mL via INTRAVENOUS

## 2020-07-09 MED ORDER — SODIUM CHLORIDE (PF) 0.9 % IJ SOLN
INTRAMUSCULAR | Status: AC
  Filled 2020-07-09: qty 10

## 2020-07-09 MED ORDER — 0.9 % SODIUM CHLORIDE (POUR BTL) OPTIME
TOPICAL | Status: DC | PRN
  Administered 2020-07-09: 5000 mL

## 2020-07-09 MED ORDER — CHLORHEXIDINE GLUCONATE CLOTH 2 % EX PADS
6.0000 | MEDICATED_PAD | Freq: Every day | CUTANEOUS | Status: DC
  Administered 2020-07-09 – 2020-07-11 (×3): 6 via TOPICAL

## 2020-07-09 MED ORDER — BISACODYL 5 MG PO TBEC
10.0000 mg | DELAYED_RELEASE_TABLET | Freq: Every day | ORAL | Status: DC
  Administered 2020-07-10 – 2020-07-11 (×2): 10 mg via ORAL
  Filled 2020-07-09 (×2): qty 2

## 2020-07-09 MED ORDER — TRANEXAMIC ACID 1000 MG/10ML IV SOLN
INTRAVENOUS | Status: DC | PRN
  Administered 2020-07-09: 1.5 mg/kg/h via INTRAVENOUS

## 2020-07-09 MED ORDER — HEPARIN SODIUM (PORCINE) 1000 UNIT/ML IJ SOLN
INTRAMUSCULAR | Status: AC
  Filled 2020-07-09: qty 1

## 2020-07-09 MED ORDER — PROTAMINE SULFATE 10 MG/ML IV SOLN
INTRAVENOUS | Status: AC
  Filled 2020-07-09: qty 25

## 2020-07-09 MED ORDER — LACTATED RINGERS IV SOLN: 500.0000 mL | Freq: Once | INTRAVENOUS | Status: DC | PRN

## 2020-07-09 MED ORDER — PANTOPRAZOLE SODIUM 40 MG PO TBEC
40.0000 mg | DELAYED_RELEASE_TABLET | Freq: Every day | ORAL | Status: DC
  Administered 2020-07-11: 40 mg via ORAL
  Filled 2020-07-09: qty 1

## 2020-07-09 MED ORDER — HEPARIN SODIUM (PORCINE) 1000 UNIT/ML IJ SOLN
INTRAMUSCULAR | Status: DC | PRN
  Administered 2020-07-09: 25000 [IU] via INTRAVENOUS
  Administered 2020-07-09: 10000 [IU] via INTRAVENOUS

## 2020-07-09 MED ORDER — MORPHINE SULFATE (PF) 2 MG/ML IV SOLN
1.0000 mg | INTRAVENOUS | Status: DC | PRN
  Administered 2020-07-09 (×6): 1 mg via INTRAVENOUS
  Administered 2020-07-10 (×3): 2 mg via INTRAVENOUS
  Filled 2020-07-09 (×6): qty 1

## 2020-07-09 MED ORDER — KETOROLAC TROMETHAMINE 30 MG/ML IJ SOLN
30.0000 mg | Freq: Once | INTRAMUSCULAR | Status: AC
  Administered 2020-07-09: 30 mg via INTRAVENOUS

## 2020-07-09 MED ORDER — SODIUM CHLORIDE 0.9% FLUSH
10.0000 mL | Freq: Two times a day (BID) | INTRAVENOUS | Status: DC
  Administered 2020-07-09 – 2020-07-11 (×6): 10 mL

## 2020-07-09 MED ORDER — METOPROLOL TARTRATE 12.5 MG HALF TABLET
12.5000 mg | ORAL_TABLET | Freq: Two times a day (BID) | ORAL | Status: DC
  Administered 2020-07-10 – 2020-07-11 (×3): 12.5 mg via ORAL
  Filled 2020-07-09 (×5): qty 1

## 2020-07-09 MED ORDER — ALBUMIN HUMAN 5 % IV SOLN
250.0000 mL | INTRAVENOUS | Status: AC | PRN
  Administered 2020-07-09 – 2020-07-10 (×2): 12.5 g via INTRAVENOUS

## 2020-07-09 MED ORDER — FENTANYL CITRATE (PF) 250 MCG/5ML IJ SOLN
INTRAMUSCULAR | Status: AC
  Filled 2020-07-09: qty 25

## 2020-07-09 MED ORDER — SODIUM CHLORIDE 0.9 % IV SOLN
INTRAVENOUS | Status: DC | PRN
  Administered 2020-07-09: 1.5 g via INTRAVENOUS
  Administered 2020-07-09: .75 g via INTRAVENOUS

## 2020-07-09 SURGICAL SUPPLY — 81 items
BAG DECANTER FOR FLEXI CONT (MISCELLANEOUS) ×4 IMPLANT
BLADE CLIPPER SURG (BLADE) ×4 IMPLANT
BLADE STERNUM SYSTEM 6 (BLADE) ×4 IMPLANT
BLADE SURG 11 STRL SS (BLADE) ×4 IMPLANT
BNDG ELASTIC 4X5.8 VLCR STR LF (GAUZE/BANDAGES/DRESSINGS) ×4 IMPLANT
BNDG ELASTIC 6X10 VLCR STRL LF (GAUZE/BANDAGES/DRESSINGS) ×4 IMPLANT
BNDG ELASTIC 6X5.8 VLCR STR LF (GAUZE/BANDAGES/DRESSINGS) ×4 IMPLANT
BNDG GAUZE ELAST 4 BULKY (GAUZE/BANDAGES/DRESSINGS) ×4 IMPLANT
BRUSH SCRUB EZ PLAIN DRY (MISCELLANEOUS) ×20 IMPLANT
CANISTER SUCT 3000ML PPV (MISCELLANEOUS) ×4 IMPLANT
CANNULA AORTIC ROOT 9FR (CANNULA) ×4 IMPLANT
CATH CPB KIT GERHARDT (MISCELLANEOUS) ×4 IMPLANT
CATH THORACIC 28FR (CATHETERS) ×4 IMPLANT
DRAIN CHANNEL 28F RND 3/8 FF (WOUND CARE) ×4 IMPLANT
DRAPE CARDIOVASCULAR INCISE (DRAPES) ×2
DRAPE SLUSH/WARMER DISC (DRAPES) ×4 IMPLANT
DRAPE SRG 135X102X78XABS (DRAPES) ×2 IMPLANT
DRSG AQUACEL AG ADV 3.5X14 (GAUZE/BANDAGES/DRESSINGS) ×4 IMPLANT
ELECT BLADE 4.0 EZ CLEAN MEGAD (MISCELLANEOUS) ×4
ELECT REM PT RETURN 9FT ADLT (ELECTROSURGICAL) ×8
ELECTRODE BLDE 4.0 EZ CLN MEGD (MISCELLANEOUS) ×2 IMPLANT
ELECTRODE REM PT RTRN 9FT ADLT (ELECTROSURGICAL) ×4 IMPLANT
FELT TEFLON 1X6 (MISCELLANEOUS) ×8 IMPLANT
FILTER SMOKE EVAC ULPA (FILTER) ×4 IMPLANT
GAUZE SPONGE 4X4 12PLY STRL (GAUZE/BANDAGES/DRESSINGS) ×8 IMPLANT
GLOVE BIO SURGEON STRL SZ 6.5 (GLOVE) ×9 IMPLANT
GLOVE BIO SURGEONS STRL SZ 6.5 (GLOVE) ×3
GLOVE SURG LTX SZ6.5 (GLOVE) ×4 IMPLANT
GOWN STRL REUS W/ TWL LRG LVL3 (GOWN DISPOSABLE) ×8 IMPLANT
GOWN STRL REUS W/TWL LRG LVL3 (GOWN DISPOSABLE) ×8
HEMOSTAT POWDER SURGIFOAM 1G (HEMOSTASIS) ×12 IMPLANT
HEMOSTAT SURGICEL 2X14 (HEMOSTASIS) ×4 IMPLANT
KIT BASIN OR (CUSTOM PROCEDURE TRAY) ×4 IMPLANT
KIT CATH SUCT 8FR (CATHETERS) ×4 IMPLANT
KIT SUCTION CATH 14FR (SUCTIONS) ×8 IMPLANT
KIT TURNOVER KIT B (KITS) ×4 IMPLANT
KIT VASOVIEW HEMOPRO 2 VH 4000 (KITS) ×4 IMPLANT
LEAD PACING MYOCARDI (MISCELLANEOUS) ×4 IMPLANT
MARKER GRAFT CORONARY BYPASS (MISCELLANEOUS) ×12 IMPLANT
NS IRRIG 1000ML POUR BTL (IV SOLUTION) ×24 IMPLANT
PACK E OPEN HEART (SUTURE) ×4 IMPLANT
PACK OPEN HEART (CUSTOM PROCEDURE TRAY) ×4 IMPLANT
PAD ARMBOARD 7.5X6 YLW CONV (MISCELLANEOUS) ×8 IMPLANT
PAD ELECT DEFIB RADIOL ZOLL (MISCELLANEOUS) ×4 IMPLANT
PENCIL BUTTON HOLSTER BLD 10FT (ELECTRODE) ×4 IMPLANT
PENCIL SMOKE EVACUATOR (MISCELLANEOUS) ×4 IMPLANT
POSITIONER HEAD DONUT 9IN (MISCELLANEOUS) ×4 IMPLANT
PUNCH AORTIC ROTATE 4.0MM (MISCELLANEOUS) ×4 IMPLANT
SET CARDIOPLEGIA MPS 5001102 (MISCELLANEOUS) ×4 IMPLANT
SLEEVE SUCTION 125 (MISCELLANEOUS) ×4 IMPLANT
SOL ANTI FOG 6CC (MISCELLANEOUS) ×2 IMPLANT
SOLUTION ANTI FOG 6CC (MISCELLANEOUS) ×2
SPONGE LAP 18X18 RF (DISPOSABLE) ×4 IMPLANT
SPONGE LAP 18X18 X RAY DECT (DISPOSABLE) ×4 IMPLANT
SURGIFLO W/THROMBIN 8M KIT (HEMOSTASIS) ×4 IMPLANT
SUT BONE WAX W31G (SUTURE) ×4 IMPLANT
SUT MNCRL AB 4-0 PS2 18 (SUTURE) ×4 IMPLANT
SUT PROLENE 3 0 SH DA (SUTURE) ×4 IMPLANT
SUT PROLENE 3 0 SH1 36 (SUTURE) ×4 IMPLANT
SUT PROLENE 4 0 RB 1 (SUTURE) ×4
SUT PROLENE 4 0 TF (SUTURE) ×8 IMPLANT
SUT PROLENE 4-0 RB1 .5 CRCL 36 (SUTURE) ×4 IMPLANT
SUT PROLENE 6 0 C 1 30 (SUTURE) ×32 IMPLANT
SUT PROLENE 6 0 CC (SUTURE) ×20 IMPLANT
SUT PROLENE 7 0 BV1 MDA (SUTURE) ×8 IMPLANT
SUT PROLENE 8 0 BV175 6 (SUTURE) ×32 IMPLANT
SUT STEEL 6MS V (SUTURE) ×4 IMPLANT
SUT STEEL SZ 6 DBL 3X14 BALL (SUTURE) ×4 IMPLANT
SUT VIC AB 1 CTX 18 (SUTURE) ×8 IMPLANT
SUT VIC AB 2-0 CT1 27 (SUTURE) ×2
SUT VIC AB 2-0 CT1 TAPERPNT 27 (SUTURE) ×2 IMPLANT
SYSTEM SAHARA CHEST DRAIN ATS (WOUND CARE) ×4 IMPLANT
TAPE CLOTH SURG 4X10 WHT LF (GAUZE/BANDAGES/DRESSINGS) ×4 IMPLANT
TAPE PAPER 2X10 WHT MICROPORE (GAUZE/BANDAGES/DRESSINGS) ×4 IMPLANT
TOWEL GREEN STERILE (TOWEL DISPOSABLE) ×4 IMPLANT
TOWEL GREEN STERILE FF (TOWEL DISPOSABLE) ×4 IMPLANT
TRAP FLUID SMOKE EVACUATOR (MISCELLANEOUS) ×4 IMPLANT
TRAY FOLEY SLVR 16FR TEMP STAT (SET/KITS/TRAYS/PACK) ×4 IMPLANT
TUBING LAP HI FLOW INSUFFLATIO (TUBING) ×4 IMPLANT
UNDERPAD 30X36 HEAVY ABSORB (UNDERPADS AND DIAPERS) ×4 IMPLANT
WATER STERILE IRR 1000ML POUR (IV SOLUTION) ×8 IMPLANT

## 2020-07-09 NOTE — Progress Notes (Signed)
Recruitment maneuver completed for 2 minutes. Sats improved from 90 to 100%. Patient placed back on original settings.

## 2020-07-09 NOTE — Progress Notes (Signed)
Attempted to wean patient.  ABG 7.27/53.9/63/25.4  RT aware. Patient placed back on original vent settings.  RN will continue to monitor patient closely.

## 2020-07-09 NOTE — Anesthesia Procedure Notes (Signed)
Central Venous Catheter Insertion Performed by: Cecile Hearing, MD, anesthesiologist Start/End2/21/2022 6:55 AM, 07/09/2020 7:00 AM Patient location: Pre-op. Preanesthetic checklist: patient identified, IV checked, site marked, risks and benefits discussed, surgical consent, monitors and equipment checked, pre-op evaluation and timeout performed Position: Trendelenburg Hand hygiene performed  and maximum sterile barriers used  Total catheter length 100. PA cath was placed.Swan type:thermodilution PA Cath depth:45 Procedure performed without using ultrasound guided technique. Attempts: 1 Patient tolerated the procedure well with no immediate complications.

## 2020-07-09 NOTE — Procedures (Signed)
Extubation Procedure Note  Patient Details:   Name: Jeffery Sweeney DOB: Sep 08, 1960 MRN: [id number]   Airway Documentation:    Vent end date: 07/09/20 Vent end time: 1840   Evaluation  O2 sats: stable throughout Complications: No apparent complications Patient did tolerate procedure well. Bilateral Breath Sounds: Clear,Diminished   Yes   Positive cuff leak noted. Patient able to get 410 VC and - 28 NIF. Patient placed on Salter HFNC 10L, no stridor noted. Patient reached 250 mL using the incentive spirometer.  Rayburn Felt 07/09/2020, 6:50 PM

## 2020-07-09 NOTE — Progress Notes (Signed)
      [address]       [name] [address]             [phone]    Pre Procedure note for inpatients:   Jeffery Sweeney has been scheduled for Procedure(s): CORONARY ARTERY BYPASS GRAFTING (CABG)WITH ENDO VEIN HARVEST (N/A) TRANSESOPHAGEAL ECHOCARDIOGRAM (TEE) (N/A) today. The various methods of treatment have been discussed with the patient. After consideration of the risks, benefits and treatment options the patient has consented to the planned procedure.   The patient has been seen and labs reviewed. There are no changes in the patient's condition to prevent proceeding with the planned procedure today.  Recent labs:  Lab Results  Component Value Date   WBC 8.4 07/09/2020   HGB 14.0 07/09/2020   HCT 41.1 07/09/2020   PLT 244 07/09/2020   GLUCOSE 95 07/09/2020   CHOL 182 07/08/2020   TRIG 141 07/08/2020   HDL 34 (L) 07/08/2020   LDLCALC 120 (H) 07/08/2020   ALT 14 07/08/2020   AST 18 07/08/2020   NA 139 07/09/2020   K 4.1 07/09/2020   CL 104 07/09/2020   CREATININE 0.98 07/09/2020   BUN 12 07/09/2020   CO2 23 07/09/2020   INR 0.9 07/08/2020   HGBA1C 5.7 (H) 07/08/2020    Delight Ovens, MD 07/09/2020 7:13 AM

## 2020-07-09 NOTE — Anesthesia Procedure Notes (Signed)
Central Venous Catheter Insertion Performed by: Catalina Gravel, MD, anesthesiologist Start/End2/21/2022 6:45 AM, 07/09/2020 6:55 AM Patient location: Pre-op. Preanesthetic checklist: patient identified, IV checked, site marked, risks and benefits discussed, surgical consent, monitors and equipment checked, pre-op evaluation, timeout performed and anesthesia consent Position: Trendelenburg Lidocaine 1% used for infiltration and patient sedated Hand hygiene performed , maximum sterile barriers used  and Seldinger technique used Catheter size: 9 Fr Central line was placed.MAC introducer Procedure performed using ultrasound guided technique. Ultrasound Notes:anatomy identified, needle tip was noted to be adjacent to the nerve/plexus identified, no ultrasound evidence of intravascular and/or intraneural injection and image(s) printed for medical record Attempts: 1 Following insertion, line sutured, dressing applied and Biopatch. Post procedure assessment: free fluid flow, blood return through all ports and no air  Patient tolerated the procedure well with no immediate complications.

## 2020-07-09 NOTE — Progress Notes (Signed)
EVENING ROUNDS NOTE :     [address]       [name] [address]             [phone]                 Day of Surgery Procedure(s) (LRB): CORONARY ARTERY BYPASS GRAFTING (CABG) x THREE , USING LEFT INTERNAL MAMMARY ARTERY, AND RIGHT LEG GREATER SAPHENOUS VEIN HARVESTED ENDOSCOPICALLY (N/A) TRANSESOPHAGEAL ECHOCARDIOGRAM (TEE) (N/A)   Total Length of Stay:  LOS: 3 days  Events:   Weaning to extubate Minimal CT output On neo intermittently Dop @3     BP (!) 98/56   Pulse 89   Temp (!) 97.52 F (36.4 C)   Resp 18   Ht 5\' 3"  (1.6 m)   Wt 79.4 kg   SpO2 93%   BMI 31.02 kg/m   PAP: (19-40)/(10-17) 26/15 CO:  [4.5 L/min-5.3 L/min] 5.3 L/min CI:  [2.5 L/min/m2-2.9 L/min/m2] 2.9 L/min/m2  Vent Mode: SIMV;PSV;PRVC FiO2 (%):  [40 %-50 %] 50 % Set Rate:  [4 bmp-16 bmp] 16 bmp Vt Set:  [450 mL-500 mL] 500 mL PEEP:  [5 cmH20] 5 cmH20 Pressure Support:  [10 cmH20] 10 cmH20 Plateau Pressure:  [16 cmH20] 16 cmH20  . sodium chloride 20 mL/hr at 07/09/20 1600  . [START ON 07/10/2020] sodium chloride    . sodium chloride 10 mL/hr at 07/09/20 1336  . albumin human 12.5 g (07/09/20 1400)  . cefUROXime (ZINACEF)  IV    . dexmedetomidine (PRECEDEX) IV infusion Stopped (07/09/20 1520)  . DOPamine 3 mcg/kg/min (07/09/20 1600)  . famotidine (PEPCID) IV Stopped (07/09/20 1523)  . insulin 1.2 mL/hr at 07/09/20 1600  . lactated ringers    . lactated ringers    . lactated ringers 20 mL/hr at 07/09/20 1600  . magnesium sulfate 20 mL/hr at 07/09/20 1600  . nitroGLYCERIN Stopped (07/09/20 1527)  . phenylephrine (NEO-SYNEPHRINE) Adult infusion Stopped (07/09/20 1514)  . vancomycin      I/O last 3 completed shifts: In: 1320.6 [P.O.:1020; I.V.:300.6] Out: 1100 [Urine:1100]   CBC Latest Ref Rng & Units 07/09/2020 07/09/2020 07/09/2020  WBC 4.0 - 10.5 K/uL 11.4(H) - -  Hemoglobin 13.0 - 17.0 g/dL 12.7(L) 12.2(L) 11.2(L)  Hematocrit 39.0 - 52.0 % 37.8(L) 36.0(L) 33.0(L)   Platelets 150 - 400 K/uL 145(L) - -    BMP Latest Ref Rng & Units 07/09/2020 07/09/2020 07/09/2020  Glucose 70 - 99 mg/dL 07/11/2020) - 07/11/2020)  BUN 6 - 20 mg/dL 10 - 11  Creatinine 937(T - 1.24 mg/dL 024(O - 9.73  Sodium 5.32 - 145 mmol/L 139 139 136  Potassium 3.5 - 5.1 mmol/L 4.3 4.3 5.6(H)  Chloride 98 - 111 mmol/L 102 - 102  CO2 22 - 32 mmol/L - - -  Calcium 8.9 - 10.3 mg/dL - - -    ABG    Component Value Date/Time   PHART 7.287 (L) 07/09/2020 1232   PCO2ART 51.0 (H) 07/09/2020 1232   PO2ART 99 07/09/2020 1232   HCO3 24.4 07/09/2020 1232   TCO2 23 07/09/2020 1235   ACIDBASEDEF 3.0 (H) 07/09/2020 1232   O2SAT 97.0 07/09/2020 1232       07/11/2020, MD 07/09/2020 5:17 PM

## 2020-07-09 NOTE — Anesthesia Procedure Notes (Addendum)
Procedure Name: Intubation Date/Time: 07/09/2020 7:42 AM Performed by: Bryson Corona, CRNA Pre-anesthesia Checklist: Patient identified, Emergency Drugs available, Suction available and Patient being monitored Patient Re-evaluated:Patient Re-evaluated prior to induction Oxygen Delivery Method: Circle System Utilized Preoxygenation: Pre-oxygenation with 100% oxygen Induction Type: IV induction Ventilation: Oral airway inserted - appropriate to patient size Laryngoscope Size: Mac and 4 Grade View: Grade I Tube type: Oral Tube size: 8.0 mm Number of attempts: 1 Airway Equipment and Method: Stylet and Oral airway Placement Confirmation: ETT inserted through vocal cords under direct vision,  positive ETCO2 and breath sounds checked- equal and bilateral Secured at: 23 cm Tube secured with: Tape Dental Injury: Teeth and Oropharynx as per pre-operative assessment  Comments: Initially grade 2 due to floppy epiglottis. Pinned epiglottis with MAC blade for grade 1 view.

## 2020-07-09 NOTE — Anesthesia Procedure Notes (Signed)
Arterial Line Insertion Start/End2/21/2022 6:50 AM, 07/09/2020 7:00 AM Performed by: Cecile Hearing, MD, anesthesiologist  Patient location: Pre-op. Preanesthetic checklist: patient identified, IV checked, site marked, risks and benefits discussed, surgical consent, monitors and equipment checked, pre-op evaluation, timeout performed and anesthesia consent Lidocaine 1% used for infiltration Left, radial was placed Catheter size: 20 Fr Hand hygiene performed  and maximum sterile barriers used   Attempts: 1 Procedure performed using ultrasound guided technique. Ultrasound Notes:anatomy identified, needle tip was noted to be adjacent to the nerve/plexus identified and no ultrasound evidence of intravascular and/or intraneural injection Following insertion, dressing applied and Biopatch. Post procedure assessment: normal and unchanged

## 2020-07-09 NOTE — Brief Op Note (Signed)
07/06/2020 - 07/09/2020  7:59 AM  PATIENT:  Jeffery Sweeney  60 y.o. male  PRE-OPERATIVE DIAGNOSIS:  coronary artery disease  POST-OPERATIVE DIAGNOSIS:  coronary artery disease  PROCEDURE:  Procedure(s): CORONARY ARTERY BYPASS GRAFTING (CABG) x THREE , USING LEFT INTERNAL MAMMARY ARTERY, AND RIGHT LEG GREATER SAPHENOUS VEIN HARVESTED ENDOSCOPICALLY (N/A) TRANSESOPHAGEAL ECHOCARDIOGRAM (TEE) (N/A)   Vein Harvest time: 35 minutes, prep 15 minutes  SVG to Prox PDA SVG to Ramus Intermedius LIMA to LAD   SURGEON:  Surgeon(s) and Role:    * Delight Ovens, MD - Primary  PHYSICIAN ASSISTANT:  Jari Favre, PA-C   ANESTHESIA:   general  EBL:  900 mL   BLOOD ADMINISTERED:none  DRAINS: ROUTINE   LOCAL MEDICATIONS USED:  BUPIVICAINE   SPECIMEN:  No Specimen  DISPOSITION OF SPECIMEN:  PATHOLOGY  COUNTS:  YES  DICTATION: .Dragon Dictation  PLAN OF CARE: Admit to inpatient   PATIENT DISPOSITION:  ICU - intubated and hemodynamically stable.   Delay start of Pharmacological VTE agent (>24hrs) due to surgical blood loss or risk of bleeding: yes

## 2020-07-09 NOTE — Transfer of Care (Signed)
Immediate Anesthesia Transfer of Care Note  Patient: KYRESE [name]  Procedure(s) Performed: CORONARY ARTERY BYPASS GRAFTING (CABG) x THREE , USING LEFT INTERNAL MAMMARY ARTERY, AND RIGHT LEG GREATER SAPHENOUS VEIN HARVESTED ENDOSCOPICALLY (N/A Chest) TRANSESOPHAGEAL ECHOCARDIOGRAM (TEE) (N/A )  Patient Location: ICU  Anesthesia Type:General  Level of Consciousness: Patient remains intubated per anesthesia plan  Airway & Oxygen Therapy: Patient remains intubated per anesthesia plan and Patient placed on Ventilator (see vital sign flow sheet for setting)  Post-op Assessment: Report given to RN and Post -op Vital signs reviewed and stable  Post vital signs: Reviewed and stable  Last Vitals:  Vitals Value Taken Time  BP 110/56 1345  Temp    Pulse 90 1345  Resp 12 1345  SpO2 90 1345    Last Pain:  Vitals:   07/09/20 0551  TempSrc: Oral  PainSc:       Patients Stated Pain Goal: 0 (07/07/20 2038)  Complications: No complications documented.

## 2020-07-10 ENCOUNTER — Encounter (HOSPITAL_COMMUNITY): Payer: Self-pay | Admitting: Cardiothoracic Surgery

## 2020-07-10 ENCOUNTER — Inpatient Hospital Stay (HOSPITAL_COMMUNITY): Payer: Self-pay

## 2020-07-10 LAB — GLUCOSE, CAPILLARY
Glucose-Capillary: 103 mg/dL — ABNORMAL HIGH (ref 70–99)
Glucose-Capillary: 103 mg/dL — ABNORMAL HIGH (ref 70–99)
Glucose-Capillary: 107 mg/dL — ABNORMAL HIGH (ref 70–99)
Glucose-Capillary: 115 mg/dL — ABNORMAL HIGH (ref 70–99)
Glucose-Capillary: 117 mg/dL — ABNORMAL HIGH (ref 70–99)
Glucose-Capillary: 117 mg/dL — ABNORMAL HIGH (ref 70–99)
Glucose-Capillary: 119 mg/dL — ABNORMAL HIGH (ref 70–99)
Glucose-Capillary: 94 mg/dL (ref 70–99)
Glucose-Capillary: 99 mg/dL (ref 70–99)

## 2020-07-10 LAB — CBC
HCT: 37.3 % — ABNORMAL LOW (ref 39.0–52.0)
HCT: 34 % — ABNORMAL LOW (ref 39.0–52.0)
Hemoglobin: 12.3 g/dL — ABNORMAL LOW (ref 13.0–17.0)
Hemoglobin: 10.8 g/dL — ABNORMAL LOW (ref 13.0–17.0)
MCH: 29.9 pg (ref 26.0–34.0)
MCH: 29.6 pg (ref 26.0–34.0)
MCHC: 33 g/dL (ref 30.0–36.0)
MCHC: 31.8 g/dL (ref 30.0–36.0)
MCV: 90.5 fL (ref 80.0–100.0)
MCV: 93.2 fL (ref 80.0–100.0)
Platelets: 168 10*3/uL (ref 150–400)
Platelets: 143 10*3/uL — ABNORMAL LOW (ref 150–400)
RBC: 4.12 MIL/uL — ABNORMAL LOW (ref 4.22–5.81)
RBC: 3.65 MIL/uL — ABNORMAL LOW (ref 4.22–5.81)
RDW: 12.9 % (ref 11.5–15.5)
RDW: 13.1 % (ref 11.5–15.5)
WBC: 12.4 10*3/uL — ABNORMAL HIGH (ref 4.0–10.5)
WBC: 10.1 10*3/uL (ref 4.0–10.5)
nRBC: 0 % (ref 0.0–0.2)
nRBC: 0 % (ref 0.0–0.2)

## 2020-07-10 LAB — BASIC METABOLIC PANEL
Anion gap: 8 (ref 5–15)
Anion gap: 5 (ref 5–15)
BUN: 11 mg/dL (ref 6–20)
BUN: 15 mg/dL (ref 6–20)
CO2: 22 mmol/L (ref 22–32)
CO2: 26 mmol/L (ref 22–32)
Calcium: 7.7 mg/dL — ABNORMAL LOW (ref 8.9–10.3)
Calcium: 7.9 mg/dL — ABNORMAL LOW (ref 8.9–10.3)
Chloride: 107 mmol/L (ref 98–111)
Chloride: 106 mmol/L (ref 98–111)
Creatinine, Ser: 0.63 mg/dL (ref 0.61–1.24)
Creatinine, Ser: 1.03 mg/dL (ref 0.61–1.24)
GFR, Estimated: >60 mL/min (ref >60)
GFR, Estimated: >60 mL/min (ref >60)
Glucose, Bld: 114 mg/dL — ABNORMAL HIGH (ref 70–99)
Glucose, Bld: 106 mg/dL — ABNORMAL HIGH (ref 70–99)
Potassium: 4.4 mmol/L (ref 3.5–5.1)
Potassium: 3.9 mmol/L (ref 3.5–5.1)
Sodium: 137 mmol/L (ref 135–145)
Sodium: 137 mmol/L (ref 135–145)

## 2020-07-10 LAB — MAGNESIUM
Magnesium: 2.4 mg/dL (ref 1.7–2.4)
Magnesium: 2.2 mg/dL (ref 1.7–2.4)

## 2020-07-10 LAB — ECHO INTRAOPERATIVE TEE: Weight: 2801.6 oz

## 2020-07-10 MED ORDER — CHLORHEXIDINE GLUCONATE 0.12 % MT SOLN
OROMUCOSAL | Status: AC
  Administered 2020-07-10: 15 mL via OROMUCOSAL
  Filled 2020-07-10: qty 15

## 2020-07-10 MED ORDER — ADULT MULTIVITAMIN W/MINERALS CH
1.0000 | ORAL_TABLET | Freq: Every day | ORAL | Status: DC
  Administered 2020-07-10 – 2020-07-11 (×2): 1 via ORAL
  Filled 2020-07-10 (×2): qty 1

## 2020-07-10 MED ORDER — FUROSEMIDE 10 MG/ML IJ SOLN
40.0000 mg | Freq: Once | INTRAMUSCULAR | Status: AC
  Administered 2020-07-10: 40 mg via INTRAVENOUS
  Filled 2020-07-10: qty 4

## 2020-07-10 MED ORDER — ENOXAPARIN SODIUM 30 MG/0.3ML ~~LOC~~ SOLN
30.0000 mg | Freq: Every day | SUBCUTANEOUS | Status: DC
  Administered 2020-07-10 – 2020-07-13 (×4): 30 mg via SUBCUTANEOUS
  Filled 2020-07-10 (×4): qty 0.3

## 2020-07-10 MED ORDER — INSULIN ASPART 100 UNIT/ML ~~LOC~~ SOLN: 0.0000 [IU] | SUBCUTANEOUS | Status: DC

## 2020-07-10 MED ORDER — INSULIN ASPART 100 UNIT/ML ~~LOC~~ SOLN
0.0000 [IU] | SUBCUTANEOUS | Status: DC
  Administered 2020-07-11 – 2020-07-12 (×3): 2 [IU] via SUBCUTANEOUS

## 2020-07-10 MED ORDER — ENSURE ENLIVE PO LIQD
237.0000 mL | Freq: Two times a day (BID) | ORAL | Status: DC
  Administered 2020-07-11: 237 mL via ORAL

## 2020-07-10 NOTE — Plan of Care (Signed)
  Problem: Activity: Goal: Risk for activity intolerance will decrease Outcome: Progressing   Problem: Cardiac: Goal: Will achieve and/or maintain hemodynamic stability Outcome: Progressing   Problem: Respiratory: Goal: Respiratory status will improve Outcome: Progressing   Problem: Urinary Elimination: Goal: Ability to achieve and maintain adequate renal perfusion and functioning will improve Outcome: Progressing   

## 2020-07-10 NOTE — Hospital Course (Signed)
      [address]       [name] [address]             [phone]       HPI:   Patient is a 60 year old male with known history of coronary artery disease having had myocardial infarction and stent placement several years ago while living in [place].  Patient was originally seen in the emergency room on February 10 for a syncopal episode.  He described dizziness and "the room spinning", there was brief loss of consciousness.  CT of the head was done.  The patient was discharged home for outpatient cardiology follow-up.  He returned on February 17 after arrival from his job at [employer] where he had onset of his wheezing substernal chest pain and noted associated shortness of breath and diaphoresis.  He was admitted to [facility] and underwent cardiac catheterization yesterday   Patient is a long-term smoker denies diabetes    Lives alone in a boardinghouse in [place], works at [employer] Course:   Mr. Dowis underwent a CABG x 3 with Dr. Tyrone Sage on 2/21. He tolerated the procedure well and was transferred to the surgical ICU for continued care. POD 1 he was extubated and on 8L [place]. He remained on some Neo which was being weaned. He did have some volume overload but we held off on diuresis until his Neo was weaned further.

## 2020-07-10 NOTE — Discharge Summary (Signed)
[address]       [place] [address]             [phone]      Physician Discharge Summary  Patient ID: Jeffery Sweeney MRN: [id number] DOB/AGE: May 10, 1961 60 y.o.  Admit date: 07/06/2020 Discharge date: 07/14/2020  Admission Diagnoses:  Patient Active Problem List   Diagnosis Date Noted  . Chest pain 07/06/2020  . CAD, multiple vessel 07/06/2020  . 3-vessel CAD 07/06/2020  . Hypertension   . Tobacco abuse   . Non-ST elevation (NSTEMI) myocardial infarction (HCC)   . Unstable angina (HCC) 07/05/2020    Discharge Diagnoses:  Principal Problem:   CAD, multiple vessel Active Problems:   Hypertension   Tobacco abuse   Non-ST elevation (NSTEMI) myocardial infarction [facility])   3-vessel CAD   S/P CABG x 3  Discharged Condition: good      HPI:   Patient is a 60 year old male with known history of coronary artery disease having had myocardial infarction and stent placement several years ago while living in [place].  Patient was originally seen in the emergency room on February 10 for a syncopal episode.  He described dizziness and "the room spinning", there was brief loss of consciousness.  CT of the head was done.  The patient was discharged home for outpatient cardiology follow-up.  He returned on February 17 after arrival from his job at [employer] where he had onset of his wheezing substernal chest pain and noted associated shortness of breath and diaphoresis.  He was admitted to [facility] and underwent cardiac catheterization yesterday   Patient is a long-term smoker denies diabetes    Lives alone in a boardinghouse in [place], works at [employer] Course:   Mr. Nydam underwent a CABG x 3 with Dr. Tyrone Sage on 2/21. He tolerated the procedure well and was transferred to the surgical ICU for continued care. POD 1 he was extubated and on 8L [place]. He remained on some Neo which was being weaned. He did have some volume  overload but we held off on diuresis until his Neo was weaned further. He was requiring 10L [place] on POD 2 with xopenex nebs ordered. We continued a diuretic regimen for fluid overload. We continued to encourage pulmonary toilet to help decrease oxygen requirements. POD 3 he was transferred to the step down unit for continued care. POD 4 he continued to progress and was on room air with good oxygen saturation. We discontinued his pacing wires since his rhythm remained stable. Today, he was ambulating with limited assistance, tolerating room air, his incisions were healing well, and he was ready for discharge home. His sister will be available to help him at  home.   Consults: None  Significant Diagnostic Studies:  CLINICAL DATA:  Chest tube.  Open-heart surgery.  EXAM: PORTABLE CHEST 1 VIEW  COMPARISON:  07/09/2020.  FINDINGS: Interim extubation and removal of NG tube. Swan-Ganz catheter, mediastinal drainage catheter, left chest tube in stable position. No pneumothorax. Prior CABG. Heart size stable. Low lung volumes with mild bilateral atelectatic changes. Small right pleural effusion cannot be excluded.  IMPRESSION: 1. Interim extubation and removal of NG tube. Swan-Ganz catheter, mediastinal drainage catheter, left chest tube in stable position. No pneumothorax. 2. Prior CABG. Low lung volumes with mild bilateral atelectatic changes. Small right pleural effusion cannot be excluded.   Electronically Signed   By: Maisie Fus  Register   On: 07/10/2020 06:50  Treatments:  Coronary artery bypass grafting x3 with left internal mammary to left anterior descending coronary artery, reverse saphenous vein graft to the intermediate coronary artery, reverse saphenous vein graft to the proximal posterior  descending coronary artery with right thigh, calf and endoscopic vein harvesting of the right greater saphenous vein.  Discharge Exam: Blood pressure 104/66, pulse 92, temperature 98.3  F (36.8 C), temperature source Oral, resp. rate 16, height 5\' 3"  (1.6 m), weight 78.1 kg, SpO2 90 %.   General appearance: alert, cooperative and no distress Heart: regular rate and rhythm and mild tachycardia Lungs: clear to auscultation bilaterally Abdomen: soft, non-tender; bowel sounds normal; no masses,  no organomegaly Extremities: extremities normal, atraumatic, no cyanosis or edema Wound: clean and dry  Discharge disposition: 01-Home or Self Care   Discharge Instructions    Amb Referral to Cardiac Rehabilitation   Complete by: As directed    Diagnosis: CABG   CABG X ___: 3   After initial evaluation and assessments completed: Virtual Based Care may be provided alone or in conjunction with Phase 2 Cardiac Rehab based on patient barriers.: Yes     Allergies as of 07/14/2020   No Known Allergies     Medication List    TAKE these medications   acetaminophen 325 MG tablet Commonly known as: TYLENOL Take 2 tablets (650 mg total) by mouth every 6 (six) hours as needed for mild pain.   aspirin 325 MG EC tablet Take 1 tablet (325 mg total) by mouth daily.   atorvastatin 80 MG tablet Commonly known as: LIPITOR Take 1 tablet (80 mg total) by mouth daily.   furosemide 40 MG tablet Commonly known as: LASIX Take 1 tablet (40 mg total) by mouth daily.   metoprolol tartrate 25 MG tablet Commonly known as: LOPRESSOR Take 1 tablet (25 mg total) by mouth 2 (two) times daily. What changed:   medication strength  how much to take   potassium chloride SA 20 MEQ tablet Commonly known as: KLOR-CON Take 1 tablet (20 mEq total) by mouth daily.   traMADol 50 MG tablet Commonly known as: ULTRAM Take 1 tablet (50 mg total) by mouth every 4 (four) hours as needed for moderate pain.            Durable Medical Equipment  (From admission, onward)         Start     Ordered   07/13/20 0916  For home use only DME 3 n 1  Once        07/13/20 0915   07/13/20 0916  For home  use only DME Walker rolling  Once       Question Answer Comment  Walker: With 5 Inch Wheels   Patient needs a walker to treat with the following condition S/P CABG x 3      07/13/20 0915          Follow-up Information    07/15/20, MD. Call in 1 day(s).   Specialty: Cardiology Contact information: [address] [place] [phone]        [phone], MD Follow up.   Specialty: Cardiothoracic Surgery Why: Your follow-up appointment is on 08/09/2020 at 9:30am. Please arrive at 9:00am for a chest xray located at [facility] imaging which is on the first floor of our building.  Contact information: [address] [place] [phone]        [phone], PA-C Follow  up.   Specialties: Physician Assistant, Cardiology Why: Westley Foots, PA-C 3/7 @2 :30 pm ([facility])  Contact information: [address] [place] [phone]        [facility], [facility] Follow up.   Why: Rolling walker and 3n1 arranged- to be delivered to room prior to discharge Contact information: [address] [phone] [place] [phone]              The patient has been discharged on:   1.Beta Blocker:  Yes [  yes ]                              No   [   ]                              If No, reason:  2.Ace Inhibitor/ARB: Yes [   ]                                     No  [ no   ]                                     If No, reason: labile BP  3.Statin:   Yes [ yes  ]                  No  [   ]                  If No, reason:  4.Ecasa:  Yes  [ yes  ]                  No   [   ]                  If No, reason:    Signed:  Note By [phone] PA-C   Ashtynn Berke 07/14/2020, 7:18 AM

## 2020-07-10 NOTE — Discharge Instructions (Signed)

## 2020-07-10 NOTE — Progress Notes (Addendum)
TCTS DAILY ICU PROGRESS NOTE                   [address]            [place] [address]          [phone]   1 Day Post-Op Procedure(s) (LRB): CORONARY ARTERY BYPASS GRAFTING (CABG) x THREE , USING LEFT INTERNAL MAMMARY ARTERY, AND RIGHT LEG GREATER SAPHENOUS VEIN HARVESTED ENDOSCOPICALLY (N/A) TRANSESOPHAGEAL ECHOCARDIOGRAM (TEE) (N/A)  Total Length of Stay:  LOS: 4 days   Subjective: Feels okay this morning. He is having some left sided chest pain, likely due to left chest tube   Objective: Vital signs in last 24 hours: Temp:  [96.8 F (36 C)-98.78 F (37.1 C)] 98.78 F (37.1 C) (02/22 0745) Pulse Rate:  [50-96] 84 (02/22 0745) Cardiac Rhythm: Normal sinus rhythm (02/22 0000) Resp:  [12-30] 12 (02/22 0745) BP: (95-148)/(53-100) 112/78 (02/22 0500) SpO2:  [90 %-100 %] 99 % (02/22 0745) Arterial Line BP: (88-147)/(49-82) 104/49 (02/22 0745) FiO2 (%):  [40 %-50 %] 40 % (02/21 1808) Weight:  [85 kg] 85 kg (02/22 0636)  Filed Weights   07/08/20 0500 07/09/20 0551 07/10/20 0636  Weight: 79 kg 79.4 kg 85 kg    Weight change: 5.575 kg   Hemodynamic parameters for last 24 hours: PAP: (19-51)/(10-36) 25/12 CO:  [4.5 L/min-5.3 L/min] 4.8 L/min CI:  [2.5 L/min/m2-2.9 L/min/m2] 2.6 L/min/m2  Intake/Output from previous day: 02/21 0701 - 02/22 0700 In: 4721.7 [I.V.:3359.1; Blood:515; IV Piggyback:847.7] Out: 3365 [Urine:2205; Blood:900; Chest Tube:260]  Intake/Output this shift: No intake/output data recorded.  Current Meds: Scheduled Meds: . acetaminophen  1,000 mg Oral Q6H   Or  . acetaminophen (TYLENOL) oral liquid 160 mg/5 mL  1,000 mg Per Tube Q6H  . aspirin EC  325 mg Oral Daily   Or  . aspirin  324 mg Per Tube Daily  . atorvastatin  80 mg Oral Daily  . bisacodyl  10 mg Oral Daily   Or  . bisacodyl  10 mg Rectal Daily  . chlorhexidine gluconate (MEDLINE KIT)  15 mL Mouth Rinse BID  . Chlorhexidine Gluconate Cloth  6 each Topical Daily   . Chlorhexidine Gluconate Cloth  6 each Topical Daily  . docusate sodium  200 mg Oral Daily  . insulin aspart  0-24 Units Subcutaneous Q4H  . levalbuterol  0.63 mg Nebulization Q6H  . lidocaine  2 patch Transdermal Q24H  . mouth rinse  15 mL Mouth Rinse 10 times per day  . metoprolol tartrate  12.5 mg Oral BID   Or  . metoprolol tartrate  12.5 mg Per Tube BID  . [START ON 07/11/2020] pantoprazole  40 mg Oral Daily  . sodium chloride flush  10-40 mL Intracatheter Q12H  . sodium chloride flush  3 mL Intravenous Q12H   Continuous Infusions: . sodium chloride 20 mL/hr at 07/10/20 0700  . sodium chloride    . sodium chloride 10 mL/hr at 07/09/20 1336  . albumin human 12.5 g (07/09/20 1400)  . cefUROXime (ZINACEF)  IV Stopped (07/09/20 2127)  . dexmedetomidine (PRECEDEX) IV infusion Stopped (07/10/20 8466)  . DOPamine Stopped (07/09/20 2054)  . lactated ringers    . lactated ringers    . lactated ringers 20 mL/hr at 07/10/20 0700  . nitroGLYCERIN Stopped (07/09/20 1848)  . phenylephrine (NEO-SYNEPHRINE) Adult infusion 20 mcg/min (07/10/20 0700)   PRN Meds:.sodium chloride, albumin human, lactated ringers, metoprolol tartrate, morphine injection, ondansetron (ZOFRAN)  IV, oxyCODONE, sodium chloride flush, sodium chloride flush, traMADol  General appearance: alert, cooperative and no distress Heart: regular rate and rhythm, S1, S2 normal, no murmur, click, rub or gallop Lungs: clear to auscultation bilaterally and diminished at the lower lobes Abdomen: soft, non-tender; bowel sounds normal; no masses,  no organomegaly Extremities: cool to touch, 2+ non pitting edema in upper and lower ext Wound: clean and dry covered with a sterile dressing  Lab Results: CBC: Recent Labs    07/09/20 1812 07/09/20 1828 07/09/20 2124 07/10/20 0253  WBC 14.5*  --   --  12.4*  HGB 13.5   < > 12.9* 12.3*  HCT 41.3   < > 38.0* 37.3*  PLT 169  --   --  168   < > = values in this interval not  displayed.   BMET:  Recent Labs    07/09/20 1812 07/09/20 1828 07/09/20 2124 07/10/20 0253  NA 138   < > 137 137  K 4.2   < > 4.1 4.4  CL 107  --   --  107  CO2 24  --   --  22  GLUCOSE 125*  --   --  114*  BUN 11  --   --  11  CREATININE 0.86  --   --  0.63  CALCIUM 7.8*  --   --  7.7*   < > = values in this interval not displayed.    CMET: Lab Results  Component Value Date   WBC 12.4 (H) 07/10/2020   HGB 12.3 (L) 07/10/2020   HCT 37.3 (L) 07/10/2020   PLT 168 07/10/2020   GLUCOSE 114 (H) 07/10/2020   CHOL 182 07/08/2020   TRIG 141 07/08/2020   HDL 34 (L) 07/08/2020   LDLCALC 120 (H) 07/08/2020   ALT 14 07/08/2020   AST 18 07/08/2020   NA 137 07/10/2020   K 4.4 07/10/2020   CL 107 07/10/2020   CREATININE 0.63 07/10/2020   BUN 11 07/10/2020   CO2 22 07/10/2020   INR 1.3 (H) 07/09/2020   HGBA1C 5.7 (H) 07/08/2020      PT/INR:  Recent Labs    07/09/20 1350  LABPROT 15.3*  INR 1.3*   Radiology: [facility] Chest Port 1 View  Result Date: 07/10/2020 CLINICAL DATA:  Chest tube.  Open-heart surgery. EXAM: PORTABLE CHEST 1 VIEW COMPARISON:  07/09/2020. FINDINGS: Interim extubation and removal of NG tube. Swan-Ganz catheter, mediastinal drainage catheter, left chest tube in stable position. No pneumothorax. Prior CABG. Heart size stable. Low lung volumes with mild bilateral atelectatic changes. Small right pleural effusion cannot be excluded. IMPRESSION: 1. Interim extubation and removal of NG tube. Swan-Ganz catheter, mediastinal drainage catheter, left chest tube in stable position. No pneumothorax. 2. Prior CABG. Low lung volumes with mild bilateral atelectatic changes. Small right pleural effusion cannot be excluded. Electronically Signed   By: Marcello Moores  Register   On: 07/10/2020 06:50   DG Chest Port 1 View  Result Date: 07/09/2020 CLINICAL DATA:  Status post coronary bypass graft. EXAM: PORTABLE CHEST 1 VIEW COMPARISON:  July 08, 2020. FINDINGS: The heart size and  mediastinal contours are within normal limits. Endotracheal and nasogastric tubes appear to be in good position. Right internal jugular Swan-Ganz catheter is noted with tip directed toward right pulmonary artery. Left-sided chest tube is noted without pneumothorax. Minimal bibasilar subsegmental atelectasis is noted. The visualized skeletal structures are unremarkable. IMPRESSION: Endotracheal and nasogastric tubes in good position. Left-sided chest tube is noted  without pneumothorax. Minimal bibasilar subsegmental atelectasis. Electronically Signed   By: Marijo Conception M.D.   On: 07/09/2020 14:10     Assessment/Plan: S/P Procedure(s) (LRB): CORONARY ARTERY BYPASS GRAFTING (CABG) x THREE , USING LEFT INTERNAL MAMMARY ARTERY, AND RIGHT LEG GREATER SAPHENOUS VEIN HARVESTED ENDOSCOPICALLY (N/A) TRANSESOPHAGEAL ECHOCARDIOGRAM (TEE) (N/A)  1. CV-Still requiring some Neo. MAPs in the 60s-70s. EKG with NSR in the 80s  2. Pulm- extubated, on 8L [place]. Xopenex ordered, 260cc/since surgery out of chest tube. CXR shows low lung volumes with mild bilateral atelectasis. Small right pleural effusion is possible. Chest tube without air leak.  3. Renal-creatinine 0.63, UP has been good however slowing down. Electrolytes okay. Weight is up 6 lbs.  4. H and H 12.3/37.3, expected acute blood loss anemia 5. Endo-blood glucose well controlled  Plan: Fluid overload and needs diuresis but would wait until Neo is weaned off a bit. Can give another albumin and work on weaning Neo. Patient is a bit confused but answers questions appropriately. See progression orders. Encouraged use of incentive spirometer hourly.    Elgie Collard 07/10/2020 7:59 AM   D/c sg  D.c aline when neo off Renal function stable  I have seen and examined Ernesta Amble and agree with the above assessment  and plan.  Grace Isaac MD Beeper [phone] Office [phone] 07/10/2020 8:30 AM

## 2020-07-10 NOTE — Progress Notes (Signed)
Patient ID: Jeffery Sweeney, male   DOB: 1960-10-08, 60 y.o.   MRN: [id number] EVENING ROUNDS NOTE :     [address]       [place] [address]             [phone]                 1 Day Post-Op Procedure(s) (LRB): CORONARY ARTERY BYPASS GRAFTING (CABG) x THREE , USING LEFT INTERNAL MAMMARY ARTERY, AND RIGHT LEG GREATER SAPHENOUS VEIN HARVESTED ENDOSCOPICALLY (N/A) TRANSESOPHAGEAL ECHOCARDIOGRAM (TEE) (N/A)  Total Length of Stay:  LOS: 4 days  BP 115/76   Pulse 98   Temp 98.1 F (36.7 C) (Oral)   Resp 18   Ht 5\' 3"  (1.6 m)   Wt 85 kg   SpO2 95%   BMI 33.19 kg/m   .Intake/Output      02/21 0701 02/22 0700 02/22 0701 02/23 0700   P.O.     I.V. (mL/kg) 3359.1 (39.5) 157.2 (1.8)   Blood 515    IV Piggyback 847.7 100   Total Intake(mL/kg) 4721.7 (55.5) 257.2 (3)   Urine (mL/kg/hr) 2205 (1.1) 195 (0.2)   Blood 900    Chest Tube 260 170   Total Output 3365 365   Net +1356.7 -107.8          . sodium chloride Stopped (07/10/20 0843)  . sodium chloride    . sodium chloride 10 mL/hr at 07/09/20 1336  . cefUROXime (ZINACEF)  IV Stopped (07/10/20 1028)  . dexmedetomidine (PRECEDEX) IV infusion Stopped (07/10/20 07/12/20)  . DOPamine Stopped (07/09/20 2054)  . lactated ringers    . lactated ringers    . lactated ringers 20 mL/hr at 07/10/20 1200  . nitroGLYCERIN Stopped (07/09/20 1848)  . phenylephrine (NEO-SYNEPHRINE) Adult infusion Stopped (07/10/20 1000)     Lab Results  Component Value Date   WBC 12.4 (H) 07/10/2020   HGB 12.3 (L) 07/10/2020   HCT 37.3 (L) 07/10/2020   PLT 168 07/10/2020   GLUCOSE 114 (H) 07/10/2020   CHOL 182 07/08/2020   TRIG 141 07/08/2020   HDL 34 (L) 07/08/2020   LDLCALC 120 (H) 07/08/2020   ALT 14 07/08/2020   AST 18 07/08/2020   NA 137 07/10/2020   K 4.4 07/10/2020   CL 107 07/10/2020   CREATININE 0.63 07/10/2020   BUN 11 07/10/2020   CO2 22 07/10/2020   INR 1.3 (H) 07/09/2020   HGBA1C 5.7 (H) 07/08/2020    Stable day Chest tubes out    07/10/2020 MD  Beeper [phone] Office [phone] 07/10/2020 4:30 PM

## 2020-07-10 NOTE — Anesthesia Postprocedure Evaluation (Signed)
Anesthesia Post Note  Patient: Jeffery Sweeney  Procedure(s) Performed: CORONARY ARTERY BYPASS GRAFTING (CABG) x THREE , USING LEFT INTERNAL MAMMARY ARTERY, AND RIGHT LEG GREATER SAPHENOUS VEIN HARVESTED ENDOSCOPICALLY (N/A Chest) TRANSESOPHAGEAL ECHOCARDIOGRAM (TEE) (N/A )     Patient location during evaluation: SICU Anesthesia Type: General Level of consciousness: sedated Pain management: pain level controlled Vital Signs Assessment: post-procedure vital signs reviewed and stable Respiratory status: patient remains intubated per anesthesia plan Cardiovascular status: stable Postop Assessment: no apparent nausea or vomiting Anesthetic complications: no   No complications documented.  Last Vitals:  Vitals:   07/10/20 1147 07/10/20 1200  BP:  114/66  Pulse:  92  Resp:  16  Temp: 37.3 C   SpO2:  94%    Last Pain:  Vitals:   07/10/20 1200  TempSrc:   PainSc: 5                  Cecile Hearing

## 2020-07-10 NOTE — Progress Notes (Addendum)
Initial Nutrition Assessment  DOCUMENTATION CODES:   Not applicable  INTERVENTION:   -Ensure Enlive BID, each supplement provides 350 kcal, 20 grams protein   -MVI with minerals po daily  NUTRITION DIAGNOSIS:   Increased nutrient needs related to post-op healing (CABG) as evidenced by estimated needs.  GOAL:   Patient will meet greater than or equal to 90% of their needs  MONITOR:   PO intake,Supplement acceptance,Skin,Labs,Weight trends,I & O's  REASON FOR ASSESSMENT:   Malnutrition Screening Tool,Other (Comment) (CABG)    ASSESSMENT:   75 YOM admitted for CAD w/ multiple vessel. PMH of CAD s/p 3 prior MI s/p PCI w/ stent placement x3, s/p STEMI (2/18), HTN, CABG eval.  02/18 - s/p cardiac catheterization showing triple-vessel CAD req CABG 02/21 - CABGx3 procedure  Pt mentioned that he has had a good appetite and has been eating well during his hospital stay. Pt's notes indicate that pt has been consuming 100% of meals. Pt denied any chewing or swallowing difficulties. Pt also denied any nausea, vomiting, diarrhea, constipation or abdominal pain. Pt mentioned that PTA he was consuming 2 meals a day with occasional snacks. He discussed with intern that he typically consumes french fries and chicken nuggets or pizza or ribs for his meals. He mentioned that occasionally he will go the full day without eating and this typically happens 1 day/week. Intern discussed with pt the importance of consuming adequate protein and calories to aid in his healing process s/p CABG. Intern discussed protein sources with pt and encouraged consuming protein with each meal and snack.   Weight's reviewed and note no significant changes. Pt discussed with intern that he felt he had not lost any weight. Pt did mention that he does have fluid accumulation regularly which can contribute to weight variation. Pt mentioned that his UBW is 174#.  Admit weight: 172#  Current Weight: 187#  No mobility  concerns for pt. Pt noted that he was mobile PTA.   Meds Reviewed: Dulcolax (daily), Colace (daily), Lactated Ringers Infusion   Labs Reviewed.   NUTRITION - FOCUSED PHYSICAL EXAM:  Flowsheet Row Most Recent Value  Orbital Region No depletion  Upper Arm Region No depletion  Thoracic and Lumbar Region No depletion  Buccal Region No depletion  Temple Region No depletion  Clavicle Bone Region No depletion  Clavicle and Acromion Bone Region No depletion  Scapular Bone Region No depletion  Dorsal Hand No depletion  Patellar Region No depletion  Anterior Thigh Region No depletion  Posterior Calf Region No depletion  Edema (RD Assessment) Mild  [bilateral extremities]  Hair Reviewed  Eyes Reviewed  Mouth Reviewed  Skin Reviewed  Nails Reviewed       Diet Order:   Diet Order            Diet Carb Modified Fluid consistency: Thin; Room service appropriate? Yes  Diet effective now                 EDUCATION NEEDS:   Education needs have been addressed (discussed protein sources with pt)  Skin:  Skin Assessment: Skin Integrity Issues: Skin Integrity Issues:: Incisions Incisions: Closed (chest), Closed R leg  Last BM:  07/08/20  Height:   Ht Readings from Last 1 Encounters:  07/09/20 5\' 3"  (1.6 m)    Weight:   Wt Readings from Last 1 Encounters:  07/10/20 85 kg    Ideal Body Weight:  56.4 kg  BMI:  Body mass index is 33.19 kg/m.  Estimated  Nutritional Needs:   Kcal:  2100-2300  Protein:  100-115 g  Fluid:  2 L    John Giovanni, Dietetic Intern 07/10/2020 2:06 PM

## 2020-07-11 ENCOUNTER — Inpatient Hospital Stay (HOSPITAL_COMMUNITY): Payer: Self-pay

## 2020-07-11 ENCOUNTER — Encounter (HOSPITAL_COMMUNITY): Payer: Self-pay | Admitting: Cardiothoracic Surgery

## 2020-07-11 LAB — BASIC METABOLIC PANEL
Anion gap: 9 (ref 5–15)
BUN: 15 mg/dL (ref 6–20)
CO2: 27 mmol/L (ref 22–32)
Calcium: 8 mg/dL — ABNORMAL LOW (ref 8.9–10.3)
Chloride: 102 mmol/L (ref 98–111)
Creatinine, Ser: 0.94 mg/dL (ref 0.61–1.24)
GFR, Estimated: >60 mL/min (ref >60)
Glucose, Bld: 164 mg/dL — ABNORMAL HIGH (ref 70–99)
Potassium: 4 mmol/L (ref 3.5–5.1)
Sodium: 138 mmol/L (ref 135–145)

## 2020-07-11 LAB — CBC
HCT: 34.4 % — ABNORMAL LOW (ref 39.0–52.0)
Hemoglobin: 10.9 g/dL — ABNORMAL LOW (ref 13.0–17.0)
MCH: 29.5 pg (ref 26.0–34.0)
MCHC: 31.7 g/dL (ref 30.0–36.0)
MCV: 93.2 fL (ref 80.0–100.0)
Platelets: 131 10*3/uL — ABNORMAL LOW (ref 150–400)
RBC: 3.69 MIL/uL — ABNORMAL LOW (ref 4.22–5.81)
RDW: 13.2 % (ref 11.5–15.5)
WBC: 9.5 10*3/uL (ref 4.0–10.5)
nRBC: 0 % (ref 0.0–0.2)

## 2020-07-11 LAB — GLUCOSE, CAPILLARY
Glucose-Capillary: 102 mg/dL — ABNORMAL HIGH (ref 70–99)
Glucose-Capillary: 108 mg/dL — ABNORMAL HIGH (ref 70–99)
Glucose-Capillary: 109 mg/dL — ABNORMAL HIGH (ref 70–99)
Glucose-Capillary: 128 mg/dL — ABNORMAL HIGH (ref 70–99)
Glucose-Capillary: 147 mg/dL — ABNORMAL HIGH (ref 70–99)
Glucose-Capillary: 72 mg/dL (ref 70–99)
Glucose-Capillary: 90 mg/dL (ref 70–99)

## 2020-07-11 MED ORDER — FUROSEMIDE 10 MG/ML IJ SOLN
40.0000 mg | Freq: Once | INTRAMUSCULAR | Status: AC
  Administered 2020-07-11: 40 mg via INTRAVENOUS
  Filled 2020-07-11: qty 4

## 2020-07-11 MED ORDER — LEVALBUTEROL HCL 0.63 MG/3ML IN NEBU: 0.6300 mg | INHALATION_SOLUTION | Freq: Four times a day (QID) | RESPIRATORY_TRACT | Status: DC | PRN

## 2020-07-11 NOTE — Progress Notes (Signed)
TCTS Evening Rounds  POD #2 s/p CABG Stable day Ambulated in hall x 3 Off oxygen No complaints  PE: BP 107/64   Pulse (!) 102   Temp 98.8 F (37.1 C) (Oral)   Resp (!) 31   Ht 5\' 3"  (1.6 m)   Wt 85.2 kg   SpO2 99%   BMI 33.27 kg/m  CTA RRR Mild edema   Intake/Output Summary (Last 24 hours) at 07/11/2020 1644 Last data filed at 07/11/2020 1552 Gross per 24 hour  Intake 1249.28 ml  Output 3470 ml  Net -2220.72 ml    A/p:  Doing much better Anticipate ICU discharge tomorrow. Klare Criss Z. 07/13/2020, MD [phone]

## 2020-07-11 NOTE — Progress Notes (Signed)
Patient ID: Jeffery Sweeney, male   DOB: [birth year], 60 y.o.   MRN: [id number]  TCTS DAILY ICU PROGRESS NOTE                   [address]            [place] [address]          [phone]   2 Days Post-Op Procedure(s) (LRB): CORONARY ARTERY BYPASS GRAFTING (CABG) x THREE , USING LEFT INTERNAL MAMMARY ARTERY, AND RIGHT LEG GREATER SAPHENOUS VEIN HARVESTED ENDOSCOPICALLY (N/A) TRANSESOPHAGEAL ECHOCARDIOGRAM (TEE) (N/A)  Total Length of Stay:  LOS: 5 days   Subjective: Patient up to chair, walked in the unit, dayshift nurse's notes that patient is on 10 L of oxygen, currently patient is comfortable sats are 96  Objective: Vital signs in last 24 hours: Temp:  [98.1 F (36.7 C)-99.2 F (37.3 C)] 98.8 F (37.1 C) (02/23 0655) Pulse Rate:  [85-108] 100 (02/23 0800) Cardiac Rhythm: Normal sinus rhythm (02/23 0400) Resp:  [13-26] 16 (02/23 0800) BP: (93-134)/(56-82) 101/56 (02/23 0800) SpO2:  [91 %-100 %] 98 % (02/23 0800) Arterial Line BP: (97-125)/(48-59) 110/51 (02/22 1030) Weight:  [85.2 kg] 85.2 kg (02/23 0400)  Filed Weights   07/09/20 0551 07/10/20 0636 07/11/20 0400  Weight: 79.4 kg 85 kg 85.2 kg    Weight change: 0.185 kg   Hemodynamic parameters for last 24 hours: PAP: (23-29)/(11-16) 23/11  Intake/Output from previous day: 02/22 0701 - 02/23 0700 In: 1442.9 [P.O.:720; I.V.:522.9; IV Piggyback:200] Out: 2285 [Urine:2115; Chest Tube:170]  Intake/Output this shift: No intake/output data recorded.  Current Meds: Scheduled Meds: . acetaminophen  1,000 mg Oral Q6H   Or  . acetaminophen (TYLENOL) oral liquid 160 mg/5 mL  1,000 mg Per Tube Q6H  . aspirin EC  325 mg Oral Daily   Or  . aspirin  324 mg Per Tube Daily  . atorvastatin  80 mg Oral Daily  . bisacodyl  10 mg Oral Daily   Or  . bisacodyl  10 mg Rectal Daily  . chlorhexidine gluconate (MEDLINE KIT)  15 mL Mouth Rinse BID  . Chlorhexidine Gluconate Cloth  6 each Topical Daily  .  Chlorhexidine Gluconate Cloth  6 each Topical Daily  . docusate sodium  200 mg Oral Daily  . enoxaparin (LOVENOX) injection  30 mg Subcutaneous QHS  . feeding supplement  237 mL Oral BID BM  . insulin aspart  0-24 Units Subcutaneous Q4H  . levalbuterol  0.63 mg Nebulization Q6H  . lidocaine  2 patch Transdermal Q24H  . metoprolol tartrate  12.5 mg Oral BID   Or  . metoprolol tartrate  12.5 mg Per Tube BID  . multivitamin with minerals  1 tablet Oral Daily  . pantoprazole  40 mg Oral Daily  . sodium chloride flush  10-40 mL Intracatheter Q12H  . sodium chloride flush  3 mL Intravenous Q12H   Continuous Infusions: . sodium chloride Stopped (07/10/20 0843)  . sodium chloride    . sodium chloride 10 mL/hr at 07/09/20 1336  . cefUROXime (ZINACEF)  IV Stopped (07/10/20 2129)  . dexmedetomidine (PRECEDEX) IV infusion Stopped (07/10/20 5176)  . DOPamine Stopped (07/09/20 2054)  . lactated ringers    . lactated ringers    . lactated ringers 20 mL/hr at 07/11/20 0700  . nitroGLYCERIN Stopped (07/09/20 1848)  . phenylephrine (NEO-SYNEPHRINE) Adult infusion Stopped (07/10/20 1000)   PRN Meds:.sodium chloride, lactated ringers, metoprolol tartrate, morphine injection, ondansetron (ZOFRAN)  IV, oxyCODONE, sodium chloride flush, sodium chloride flush, traMADol  General appearance: alert, cooperative and no distress Neurologic: intact Heart: regular rate and rhythm, S1, S2 normal, no murmur, click, rub or gallop Lungs: clear to auscultation bilaterally Abdomen: soft, non-tender; bowel sounds normal; no masses,  no organomegaly Extremities: extremities normal, atraumatic, no cyanosis or edema and Homans sign is negative, no sign of DVT Wound: Sternal incision is intact  Lab Results: CBC: Recent Labs    07/10/20 1655 07/11/20 0416  WBC 10.1 9.5  HGB 10.8* 10.9*  HCT 34.0* 34.4*  PLT 143* 131*   BMET:  Recent Labs    07/10/20 1655 07/11/20 0416  NA 137 138  K 3.9 4.0  CL 106 102   CO2 26 27  GLUCOSE 106* 164*  BUN 15 15  CREATININE 1.03 0.94  CALCIUM 7.9* 8.0*    CMET: Lab Results  Component Value Date   WBC 9.5 07/11/2020   HGB 10.9 (L) 07/11/2020   HCT 34.4 (L) 07/11/2020   PLT 131 (L) 07/11/2020   GLUCOSE 164 (H) 07/11/2020   CHOL 182 07/08/2020   TRIG 141 07/08/2020   HDL 34 (L) 07/08/2020   LDLCALC 120 (H) 07/08/2020   ALT 14 07/08/2020   AST 18 07/08/2020   NA 138 07/11/2020   K 4.0 07/11/2020   CL 102 07/11/2020   CREATININE 0.94 07/11/2020   BUN 15 07/11/2020   CO2 27 07/11/2020   INR 1.3 (H) 07/09/2020   HGBA1C 5.7 (H) 07/08/2020      PT/INR:  Recent Labs    07/09/20 1350  LABPROT 15.3*  INR 1.3*   Radiology: [facility] Chest Port 1 View  Result Date: 07/11/2020 CLINICAL DATA:  Sore chest.  Coronary artery disease. EXAM: PORTABLE CHEST 1 VIEW COMPARISON:  07/10/2020. FINDINGS: Interim removal Swan-Ganz catheter, mediastinal drainage catheter, left chest tube. Right IJ sheath in stable position. Prior CABG. Heart size stable. Low lung volumes with bibasilar atelectasis. Mild bilateral interstitial prominence and small left pleural effusion noted on today's exam. Mild component CHF cannot be excluded. No pneumothorax. IMPRESSION: 1. Interim removal Swan-Ganz catheter, mediastinal drainage catheter, left chest tube. Right IJ sheath in stable position. 2. Prior CABG. Heart size stable. Mild bilateral interstitial prominence and small left pleural effusion noted on today's exam. Mild CHF cannot be excluded. 3. Persistent low lung volumes with bibasilar atelectasis. Electronically Signed   By: Marcello Moores  Register   On: 07/11/2020 06:53     Assessment/Plan: S/P Procedure(s) (LRB): CORONARY ARTERY BYPASS GRAFTING (CABG) x THREE , USING LEFT INTERNAL MAMMARY ARTERY, AND RIGHT LEG GREATER SAPHENOUS VEIN HARVESTED ENDOSCOPICALLY (N/A) TRANSESOPHAGEAL ECHOCARDIOGRAM (TEE) (N/A) Mobilize Diuresis Work on pulmonary toilet Unclear what his true O2 demand  is-this morning I found him on 10 L nasal cannula high flow-probably too high will wean as tolerated and continue to observe in the ICU Work on pulmonary toilet    Grace Isaac 07/11/2020 8:07 AM

## 2020-07-12 ENCOUNTER — Encounter: Payer: Self-pay | Admitting: Cardiothoracic Surgery

## 2020-07-12 ENCOUNTER — Inpatient Hospital Stay (HOSPITAL_COMMUNITY): Payer: Self-pay

## 2020-07-12 LAB — CBC
HCT: 32.6 % — ABNORMAL LOW (ref 39.0–52.0)
Hemoglobin: 10.9 g/dL — ABNORMAL LOW (ref 13.0–17.0)
MCH: 30.2 pg (ref 26.0–34.0)
MCHC: 33.4 g/dL (ref 30.0–36.0)
MCV: 90.3 fL (ref 80.0–100.0)
Platelets: 149 10*3/uL — ABNORMAL LOW (ref 150–400)
RBC: 3.61 MIL/uL — ABNORMAL LOW (ref 4.22–5.81)
RDW: 13.2 % (ref 11.5–15.5)
WBC: 9.5 10*3/uL (ref 4.0–10.5)
nRBC: 0 % (ref 0.0–0.2)

## 2020-07-12 LAB — BASIC METABOLIC PANEL
Anion gap: 8 (ref 5–15)
BUN: 15 mg/dL (ref 6–20)
CO2: 28 mmol/L (ref 22–32)
Calcium: 8.2 mg/dL — ABNORMAL LOW (ref 8.9–10.3)
Chloride: 100 mmol/L (ref 98–111)
Creatinine, Ser: 0.81 mg/dL (ref 0.61–1.24)
GFR, Estimated: >60 mL/min (ref >60)
Glucose, Bld: 93 mg/dL (ref 70–99)
Potassium: 3.9 mmol/L (ref 3.5–5.1)
Sodium: 136 mmol/L (ref 135–145)

## 2020-07-12 LAB — GLUCOSE, CAPILLARY
Glucose-Capillary: 96 mg/dL (ref 70–99)
Glucose-Capillary: 129 mg/dL — ABNORMAL HIGH (ref 70–99)
Glucose-Capillary: 83 mg/dL (ref 70–99)
Glucose-Capillary: 97 mg/dL (ref 70–99)
Glucose-Capillary: 86 mg/dL (ref 70–99)
Glucose-Capillary: 102 mg/dL — ABNORMAL HIGH (ref 70–99)

## 2020-07-12 MED ORDER — POTASSIUM CHLORIDE CRYS ER 20 MEQ PO TBCR
20.0000 meq | EXTENDED_RELEASE_TABLET | Freq: Every day | ORAL | Status: DC
  Administered 2020-07-12 – 2020-07-14 (×3): 20 meq via ORAL
  Filled 2020-07-12 (×3): qty 1

## 2020-07-12 MED ORDER — SODIUM CHLORIDE 0.9% FLUSH: 3.0000 mL | INTRAVENOUS | Status: DC | PRN

## 2020-07-12 MED ORDER — TRAMADOL HCL 50 MG PO TABS
50.0000 mg | ORAL_TABLET | ORAL | Status: DC | PRN
  Administered 2020-07-14 (×2): 50 mg via ORAL
  Filled 2020-07-12 (×2): qty 1

## 2020-07-12 MED ORDER — ASPIRIN EC 325 MG PO TBEC
325.0000 mg | DELAYED_RELEASE_TABLET | Freq: Every day | ORAL | Status: DC
  Administered 2020-07-12 – 2020-07-14 (×3): 325 mg via ORAL
  Filled 2020-07-12 (×3): qty 1

## 2020-07-12 MED ORDER — ~~LOC~~ CARDIAC SURGERY, PATIENT & FAMILY EDUCATION: Freq: Once | Status: AC

## 2020-07-12 MED ORDER — GUAIFENESIN ER 600 MG PO TB12: 600.0000 mg | ORAL_TABLET | Freq: Two times a day (BID) | ORAL | Status: DC | PRN

## 2020-07-12 MED ORDER — METOPROLOL TARTRATE 12.5 MG HALF TABLET
12.5000 mg | ORAL_TABLET | Freq: Two times a day (BID) | ORAL | Status: DC
  Administered 2020-07-12 (×2): 12.5 mg via ORAL
  Filled 2020-07-12 (×2): qty 1

## 2020-07-12 MED ORDER — ONDANSETRON HCL 4 MG/2ML IJ SOLN: 4.0000 mg | Freq: Four times a day (QID) | INTRAMUSCULAR | Status: DC | PRN

## 2020-07-12 MED ORDER — ACETAMINOPHEN 325 MG PO TABS: 650.0000 mg | ORAL_TABLET | Freq: Four times a day (QID) | ORAL | Status: DC | PRN

## 2020-07-12 MED ORDER — OXYCODONE HCL 5 MG PO TABS
5.0000 mg | ORAL_TABLET | ORAL | Status: DC | PRN
Start: 2020-07-12 — End: 2020-07-15
  Administered 2020-07-12: 5 mg via ORAL
  Filled 2020-07-12: qty 1

## 2020-07-12 MED ORDER — FUROSEMIDE 40 MG PO TABS
40.0000 mg | ORAL_TABLET | Freq: Every day | ORAL | Status: DC
  Administered 2020-07-12 – 2020-07-14 (×3): 40 mg via ORAL
  Filled 2020-07-12 (×3): qty 1

## 2020-07-12 MED ORDER — PANTOPRAZOLE SODIUM 40 MG PO TBEC
40.0000 mg | DELAYED_RELEASE_TABLET | Freq: Every day | ORAL | Status: DC
  Administered 2020-07-13 – 2020-07-14 (×2): 40 mg via ORAL
  Filled 2020-07-12 (×2): qty 1

## 2020-07-12 MED ORDER — BISACODYL 10 MG RE SUPP: 10.0000 mg | Freq: Every day | RECTAL | Status: DC | PRN

## 2020-07-12 MED ORDER — INSULIN ASPART 100 UNIT/ML ~~LOC~~ SOLN: 0.0000 [IU] | Freq: Three times a day (TID) | SUBCUTANEOUS | Status: DC

## 2020-07-12 MED ORDER — SODIUM CHLORIDE 0.9% FLUSH
3.0000 mL | Freq: Two times a day (BID) | INTRAVENOUS | Status: DC
  Administered 2020-07-12 – 2020-07-14 (×5): 3 mL via INTRAVENOUS

## 2020-07-12 MED ORDER — BISACODYL 5 MG PO TBEC: 10.0000 mg | DELAYED_RELEASE_TABLET | Freq: Every day | ORAL | Status: DC | PRN

## 2020-07-12 MED ORDER — ONDANSETRON HCL 4 MG PO TABS: 4.0000 mg | ORAL_TABLET | Freq: Four times a day (QID) | ORAL | Status: DC | PRN

## 2020-07-12 MED ORDER — SODIUM CHLORIDE 0.9 % IV SOLN: 250.0000 mL | INTRAVENOUS | Status: DC | PRN

## 2020-07-12 MED ORDER — DOCUSATE SODIUM 100 MG PO CAPS
200.0000 mg | ORAL_CAPSULE | Freq: Every day | ORAL | Status: DC
  Administered 2020-07-12 – 2020-07-14 (×3): 200 mg via ORAL
  Filled 2020-07-12 (×3): qty 2

## 2020-07-12 MED FILL — Sodium Bicarbonate IV Soln 8.4%: INTRAVENOUS | Qty: 50 | Status: AC

## 2020-07-12 MED FILL — Mannitol IV Soln 20%: INTRAVENOUS | Qty: 500 | Status: AC

## 2020-07-12 MED FILL — Potassium Chloride Inj 2 mEq/ML: INTRAVENOUS | Qty: 40 | Status: AC

## 2020-07-12 MED FILL — Lidocaine HCl Local Soln Prefilled Syringe 100 MG/5ML (2%): INTRAMUSCULAR | Qty: 5 | Status: AC

## 2020-07-12 MED FILL — Sodium Chloride IV Soln 0.9%: INTRAVENOUS | Qty: 2000 | Status: AC

## 2020-07-12 MED FILL — Electrolyte-R (PH 7.4) Solution: INTRAVENOUS | Qty: 5000 | Status: AC

## 2020-07-12 MED FILL — Magnesium Sulfate Inj 50%: INTRAMUSCULAR | Qty: 10 | Status: AC

## 2020-07-12 MED FILL — Heparin Sodium (Porcine) Inj 1000 Unit/ML: INTRAMUSCULAR | Qty: 30 | Status: AC

## 2020-07-12 MED FILL — Heparin Sodium (Porcine) Inj 1000 Unit/ML: INTRAMUSCULAR | Qty: 40 | Status: AC

## 2020-07-12 NOTE — Progress Notes (Signed)
Pt HR is slightly elevated in the 110s and his oxygen saturation dropping to the low 80s on room air. 2l O2 applied, patient readjusted in bed  O2 in in the low 90s. Scheduled Metoprolol administered. We'll continue to monitor.

## 2020-07-12 NOTE — Progress Notes (Signed)
CARDIAC REHAB PHASE I   Returned to offer to walk with pt. Pt receiving pain medicine for incisional pain. Will f/u as time allows to continue to encourage ambulation.  Reynold Bowen, RN BSN 07/12/2020 12:42 PM

## 2020-07-12 NOTE — Progress Notes (Signed)
CARDIAC REHAB PHASE I   PRE:  Rate/Rhythm: 101 ST  BP:  Sitting: 122/68      SaO2: 91 RA  MODE:  Ambulation: 900 ft   POST:  Rate/Rhythm: 115 ST  BP:  Sitting: 123/70    SaO2: 96 RA   Pt ambulated 989ft in hallway standby assist with EVA. Pt able to maintain conversation throughout walk. Denies SOB or dizziness. Pt c/o pain in BL armpit area. Pt returned to sitting on EOB. Encouraged continued ambulation and IS use. Will continue to follow.  [id number] Jeffery Bowen, RN BSN 07/12/2020 1:59 PM

## 2020-07-12 NOTE — Progress Notes (Signed)
Mobility Specialist - Progress Note   07/12/20 1454  Mobility  Activity Refused mobility   States he has already ambulated 2x today and doesn't want to walk any more today.  Mamie Levers Mobility Specialist Mobility Specialist Phone: [phone]

## 2020-07-12 NOTE — Progress Notes (Signed)
Pt arrived to rm 5 from [facility]. initiated tele. CHG wipe and assessment performed. VSS. Call bell within reach.   Lawson Radar, RN

## 2020-07-12 NOTE — Progress Notes (Signed)
Patient ID: Jeffery Sweeney, male   DOB: [birth year], 60 y.o.   MRN: [id number] TCTS DAILY ICU PROGRESS NOTE                   [address]            [place] [address]          [phone]   3 Days Post-Op Procedure(s) (LRB): CORONARY ARTERY BYPASS GRAFTING (CABG) x THREE , USING LEFT INTERNAL MAMMARY ARTERY, AND RIGHT LEG GREATER SAPHENOUS VEIN HARVESTED ENDOSCOPICALLY (N/A) TRANSESOPHAGEAL ECHOCARDIOGRAM (TEE) (N/A)  Total Length of Stay:  LOS: 6 days   Subjective: Patient awake alert sitting in the chair, notes he walked around the unit twice  Objective: Vital signs in last 24 hours: Temp:  [97.7 F (36.5 C)-98.8 F (37.1 C)] 98.2 F (36.8 C) (02/24 0730) Pulse Rate:  [91-113] 102 (02/24 0700) Cardiac Rhythm: Normal sinus rhythm (02/24 0730) Resp:  [12-31] 20 (02/24 0700) BP: (92-145)/(54-84) 93/62 (02/24 0700) SpO2:  [90 %-99 %] 96 % (02/24 0700) Weight:  [82.5 kg] 82.5 kg (02/24 0500)  Filed Weights   07/10/20 0636 07/11/20 0400 07/12/20 0500  Weight: 85 kg 85.2 kg 82.5 kg    Weight change: -2.722 kg   Hemodynamic parameters for last 24 hours:    Intake/Output from previous day: 02/23 0701 - 02/24 0700 In: 913.8 [P.O.:770; I.V.:43.7; IV Piggyback:100.1] Out: 2000 [Urine:2000]  Intake/Output this shift: No intake/output data recorded.  Current Meds: Scheduled Meds: . acetaminophen  1,000 mg Oral Q6H   Or  . acetaminophen (TYLENOL) oral liquid 160 mg/5 mL  1,000 mg Per Tube Q6H  . aspirin EC  325 mg Oral Daily   Or  . aspirin  324 mg Per Tube Daily  . atorvastatin  80 mg Oral Daily  . bisacodyl  10 mg Oral Daily   Or  . bisacodyl  10 mg Rectal Daily  . chlorhexidine gluconate (MEDLINE KIT)  15 mL Mouth Rinse BID  . Chlorhexidine Gluconate Cloth  6 each Topical Daily  . Chlorhexidine Gluconate Cloth  6 each Topical Daily  . docusate sodium  200 mg Oral Daily  . enoxaparin (LOVENOX) injection  30 mg Subcutaneous QHS  . feeding  supplement  237 mL Oral BID BM  . insulin aspart  0-24 Units Subcutaneous Q4H  . lidocaine  2 patch Transdermal Q24H  . metoprolol tartrate  12.5 mg Oral BID   Or  . metoprolol tartrate  12.5 mg Per Tube BID  . multivitamin with minerals  1 tablet Oral Daily  . pantoprazole  40 mg Oral Daily  . sodium chloride flush  10-40 mL Intracatheter Q12H  . sodium chloride flush  3 mL Intravenous Q12H   Continuous Infusions: . sodium chloride Stopped (07/10/20 0843)  . sodium chloride    . sodium chloride 10 mL/hr at 07/09/20 1336  . dexmedetomidine (PRECEDEX) IV infusion Stopped (07/10/20 8299)  . DOPamine Stopped (07/09/20 2054)  . lactated ringers    . lactated ringers    . lactated ringers Stopped (07/11/20 1027)  . nitroGLYCERIN Stopped (07/09/20 1848)  . phenylephrine (NEO-SYNEPHRINE) Adult infusion Stopped (07/10/20 1000)   PRN Meds:.sodium chloride, lactated ringers, levalbuterol, metoprolol tartrate, morphine injection, ondansetron (ZOFRAN) IV, oxyCODONE, sodium chloride flush, sodium chloride flush, traMADol  General appearance: alert and cooperative Neurologic: intact Heart: regular rate and rhythm, S1, S2 normal, no murmur, click, rub or gallop Lungs: diminished breath sounds bibasilar Abdomen: soft, non-tender; bowel sounds  normal; no masses,  no organomegaly Extremities: extremities normal, atraumatic, no cyanosis or edema Wound: Sternum stable  Lab Results: CBC: Recent Labs    07/11/20 0416 07/12/20 0344  WBC 9.5 9.5  HGB 10.9* 10.9*  HCT 34.4* 32.6*  PLT 131* 149*   BMET:  Recent Labs    07/11/20 0416 07/12/20 0344  NA 138 136  K 4.0 3.9  CL 102 100  CO2 27 28  GLUCOSE 164* 93  BUN 15 15  CREATININE 0.94 0.81  CALCIUM 8.0* 8.2*    CMET: Lab Results  Component Value Date   WBC 9.5 07/12/2020   HGB 10.9 (L) 07/12/2020   HCT 32.6 (L) 07/12/2020   PLT 149 (L) 07/12/2020   GLUCOSE 93 07/12/2020   CHOL 182 07/08/2020   TRIG 141 07/08/2020   HDL 34  (L) 07/08/2020   LDLCALC 120 (H) 07/08/2020   ALT 14 07/08/2020   AST 18 07/08/2020   NA 136 07/12/2020   K 3.9 07/12/2020   CL 100 07/12/2020   CREATININE 0.81 07/12/2020   BUN 15 07/12/2020   CO2 28 07/12/2020   INR 1.3 (H) 07/09/2020   HGBA1C 5.7 (H) 07/08/2020      PT/INR:  Recent Labs    07/09/20 1350  LABPROT 15.3*  INR 1.3*   Radiology: [facility] Chest Port 1 View  Result Date: 07/12/2020 CLINICAL DATA:  Sore chest.  Coronary artery disease. EXAM: PORTABLE CHEST 1 VIEW COMPARISON:  07/11/2020. FINDINGS: Right IJ sheath in stable position. Prior CABG. Heart size stable. Interim near complete resolution of bilateral interstitial prominence. Low lung volumes with persistent mild bibasilar atelectasis. Persistent tiny bilateral pleural effusions. IMPRESSION: 1. Right IJ sheath in stable position. 2. Prior CABG. Interim near complete resolution of bilateral interstitial prominence. 3. Persistent mild bibasilar atelectasis. Persistent tiny bilateral pleural effusions. Electronically Signed   By: Marcello Moores  Register   On: 07/12/2020 06:50     Assessment/Plan: S/P Procedure(s) (LRB): CORONARY ARTERY BYPASS GRAFTING (CABG) x THREE , USING LEFT INTERNAL MAMMARY ARTERY, AND RIGHT LEG GREATER SAPHENOUS VEIN HARVESTED ENDOSCOPICALLY (N/A) TRANSESOPHAGEAL ECHOCARDIOGRAM (TEE) (N/A) Mobilize Diuresis Plan for transfer to step-down: see transfer orders Oxygen requirements have decreased-continue to work on pulmonary toilet    Jeffery Sweeney 07/12/2020 8:39 AM

## 2020-07-12 NOTE — Progress Notes (Signed)
CARDIAC REHAB PHASE I   Went to offer to walk with pt. Pt declines, recently back to bed. Will f/u and encourage continued ambulation.   Reynold Bowen, RN BSN 07/12/2020 10:55 AM

## 2020-07-13 ENCOUNTER — Inpatient Hospital Stay (HOSPITAL_COMMUNITY): Payer: Self-pay

## 2020-07-13 DIAGNOSIS — Z720 Tobacco use: Secondary | ICD-10-CM

## 2020-07-13 LAB — GLUCOSE, CAPILLARY
Glucose-Capillary: 104 mg/dL — ABNORMAL HIGH (ref 70–99)
Glucose-Capillary: 88 mg/dL (ref 70–99)
Glucose-Capillary: 119 mg/dL — ABNORMAL HIGH (ref 70–99)
Glucose-Capillary: 119 mg/dL — ABNORMAL HIGH (ref 70–99)

## 2020-07-13 LAB — BASIC METABOLIC PANEL
Anion gap: 10 (ref 5–15)
BUN: 16 mg/dL (ref 6–20)
CO2: 25 mmol/L (ref 22–32)
Calcium: 8.2 mg/dL — ABNORMAL LOW (ref 8.9–10.3)
Chloride: 101 mmol/L (ref 98–111)
Creatinine, Ser: 0.97 mg/dL (ref 0.61–1.24)
GFR, Estimated: >60 mL/min (ref >60)
Glucose, Bld: 89 mg/dL (ref 70–99)
Potassium: 3.9 mmol/L (ref 3.5–5.1)
Sodium: 136 mmol/L (ref 135–145)

## 2020-07-13 LAB — CBC
HCT: 35 % — ABNORMAL LOW (ref 39.0–52.0)
Hemoglobin: 11.3 g/dL — ABNORMAL LOW (ref 13.0–17.0)
MCH: 29.4 pg (ref 26.0–34.0)
MCHC: 32.3 g/dL (ref 30.0–36.0)
MCV: 90.9 fL (ref 80.0–100.0)
Platelets: 192 10*3/uL (ref 150–400)
RBC: 3.85 MIL/uL — ABNORMAL LOW (ref 4.22–5.81)
RDW: 13.1 % (ref 11.5–15.5)
WBC: 9.3 10*3/uL (ref 4.0–10.5)
nRBC: 0 % (ref 0.0–0.2)

## 2020-07-13 MED ORDER — METOPROLOL TARTRATE 25 MG PO TABS
25.0000 mg | ORAL_TABLET | Freq: Two times a day (BID) | ORAL | Status: DC
  Administered 2020-07-13 – 2020-07-14 (×3): 25 mg via ORAL
  Filled 2020-07-13 (×3): qty 1

## 2020-07-13 NOTE — Progress Notes (Signed)
CARDIAC REHAB PHASE I   PRE:  Rate/Rhythm: 110 ST  BP:  Sitting: 126/81      SaO2: 94 RA  MODE:  Ambulation: 940 ft   POST:  Rate/Rhythm: 122 ST  BP:  Sitting: 141/81    SaO2: 99 RA   Pt ambulated 974ft in hallway standby assist with front wheel walker. Pt denies SOB or dizziness. Still with some BL armpit pain. Pt returned to BR. BR call light within reach. Encouraged continued ambulation and IS use. Pt requesting walker and 3-in-1 for home use, CM aware.   [phone] Reynold Bowen, RN BSN 07/13/2020 9:11 AM

## 2020-07-13 NOTE — Progress Notes (Signed)
CARDIAC REHAB PHASE I   Went to complete education with pt. Pt requesting ed be done with sister via telephone. Attempted to call sister, no answer at this time. Materials left at bedside. Reviewed importance of sternal precautions, IS, and walks. Will refer to [facility]. Will f/u to reinforce ed tomorrow.  [id number] Reynold Bowen, RN BSN 07/13/2020 3:09 PM

## 2020-07-13 NOTE — Progress Notes (Signed)
Progress Note  Patient Name: Jeffery Sweeney Date of Encounter: 07/13/2020  [facility] Cardiologist: Kathlyn Sacramento, MD   Subjective   Reports chest soreness.  Denies any dyspnea.  Inpatient Medications    Scheduled Meds: . aspirin EC  325 mg Oral Daily  . atorvastatin  80 mg Oral Daily  . chlorhexidine gluconate (MEDLINE KIT)  15 mL Mouth Rinse BID  . docusate sodium  200 mg Oral Daily  . enoxaparin (LOVENOX) injection  30 mg Subcutaneous QHS  . furosemide  40 mg Oral Daily  . insulin aspart  0-24 Units Subcutaneous TID AC & HS  . metoprolol tartrate  25 mg Oral BID  . pantoprazole  40 mg Oral QAC breakfast  . potassium chloride  20 mEq Oral Daily  . sodium chloride flush  3 mL Intravenous Q12H   Continuous Infusions: . sodium chloride     PRN Meds: sodium chloride, acetaminophen, bisacodyl **OR** bisacodyl, guaiFENesin, levalbuterol, ondansetron **OR** ondansetron (ZOFRAN) IV, oxyCODONE, sodium chloride flush, traMADol   Vital Signs    Vitals:   07/13/20 0700 07/13/20 0737 07/13/20 0741 07/13/20 0827  BP: 119/75 119/75 119/75 126/86  Pulse: (!) 105 (!) 106  (!) 106  Resp: (!) _0 Temp: 98.8 F (37.1 C) 98.8 F (37.1 C) 98.8 F (37.1 C)   TempSrc: Oral  Oral   SpO2: 92% 90%    Weight:      Height:        Intake/Output Summary (Last 24 hours) at 07/13/2020 0955 Last data filed at 07/13/2020 0845 Gross per 24 hour  Intake 920 ml  Output 2175 ml  Net -1255 ml   Last 3 Weights 07/13/2020 07/12/2020 07/11/2020  Weight (lbs) 178 lb 3.2 oz 181 lb 12.8 oz 187 lb 12.8 oz  Weight (kg) 80.831 kg 82.464 kg 85.186 kg      Telemetry    SR - Personally Reviewed  ECG    No new - Personally Reviewed  Physical Exam   GEN: No acute distress.   Neck: No JVD Cardiac: RRR, no murmurs, rubs, or gallops Respiratory: Clear to auscultation bilaterally. GI: Soft, nontender, non-distended  MS: Trace edema; No deformity. Neuro:  Nonfocal  Psych: Normal  affect   Labs    High Sensitivity Troponin:   Recent Labs  Lab 06/28/20 1517 06/28/20 1748 07/05/20 2037 07/05/20 2236 07/06/20 0608  TROPONINIHS 3 3 25* 76* 969*      Chemistry Recent Labs  Lab 07/08/20 0455 07/09/20 0216 07/11/20 0416 07/12/20 0344 07/13/20 0047  NA 138   < > 138 136 136  K 4.0   < > 4.0 3.9 3.9  CL 104   < > 102 100 101  CO2 23   < > _1 GLUCOSE 96   < > 164* 93 89  BUN 12   < > _2 CREATININE 0.95   < > 0.94 0.81 0.97  CALCIUM 8.8*   < > 8.0* 8.2* 8.2*  PROT 6.0*  --   --   --   --   ALBUMIN 3.1*  --   --   --   --   AST 18  --   --   --   --   ALT 14  --   --   --   --   ALKPHOS 59  --   --   --   --   BILITOT 0.4  --   --   --   --  GFRNONAA >60   < > >60 >60 >60  ANIONGAP 11   < > _0 < > = values in this interval not displayed.     Hematology Recent Labs  Lab 07/11/20 0416 07/12/20 0344 07/13/20 0047  WBC 9.5 9.5 9.3  RBC 3.69* 3.61* 3.85*  HGB 10.9* 10.9* 11.3*  HCT 34.4* 32.6* 35.0*  MCV 93.2 90.3 90.9  MCH 29.5 30.2 29.4  MCHC 31.7 33.4 32.3  RDW 13.2 13.2 13.1  PLT 131* 149* 192    BNP No results for input(s): BNP, PROBNP in the last 168 hours.   DDimer No results for input(s): DDIMER in the last 168 hours.   Radiology    DG Chest 2 View  Result Date: 07/13/2020 CLINICAL DATA:  Postop CABG on 07/09/2020, history coronary artery disease post MI x 3, hypertension, chest soreness EXAM: CHEST - 2 VIEW COMPARISON:  07/12/2020 FINDINGS: Normal heart size post CABG. Stable mediastinal contours and pulmonary vascularity. Interval removal of RIGHT jugular line. Mild LEFT basilar atelectasis. Remaining lungs clear. No pleural effusion or pneumothorax. IMPRESSION: Improved bibasilar atelectasis. Electronically Signed   By: Lavonia Dana M.D.   On: 07/13/2020 08:15   DG Chest Port 1 View  Result Date: 07/12/2020 CLINICAL DATA:  Sore chest.  Coronary artery disease. EXAM: PORTABLE CHEST 1 VIEW COMPARISON:   07/11/2020. FINDINGS: Right IJ sheath in stable position. Prior CABG. Heart size stable. Interim near complete resolution of bilateral interstitial prominence. Low lung volumes with persistent mild bibasilar atelectasis. Persistent tiny bilateral pleural effusions. IMPRESSION: 1. Right IJ sheath in stable position. 2. Prior CABG. Interim near complete resolution of bilateral interstitial prominence. 3. Persistent mild bibasilar atelectasis. Persistent tiny bilateral pleural effusions. Electronically Signed   By: Marcello Moores  Register   On: 07/12/2020 06:50    Cardiac Studies   Echo 07/06/20 1. Left ventricular ejection fraction, by estimation, is 55 to 60%. The  left ventricle has normal function. The left ventricle has no regional  wall motion abnormalities. There is mild left ventricular hypertrophy.  Left ventricular diastolic parameters  were normal. The average left ventricular global longitudinal strain is  -15.2 %. The global longitudinal strain is abnormal.  2. Right ventricular systolic function is normal. The right ventricular  size is normal.  3. The mitral valve is normal in structure. No evidence of mitral valve  regurgitation. No evidence of mitral stenosis.  4. The aortic valve is normal in structure. Aortic valve regurgitation is  not visualized. No aortic stenosis is present.  5. The inferior vena cava is normal in size with greater than 50%  respiratory variability, suggesting right atrial pressure of 3 mmHg.   LHC 07/06/2020  The left ventricular systolic function is normal.  LV end diastolic pressure is normal.  The left ventricular ejection fraction is 55-65% by visual estimate.  Prox RCA lesion is 80% stenosed.  Dist RCA lesion is 30% stenosed.  RPDA lesion is 90% stenosed.  Prox LAD to Mid LAD lesion is 60% stenosed.  1st Diag lesion is 90% stenosed.  Lat 1st Diag lesion is 90% stenosed.  Mid Cx to Dist Cx lesion is 90% stenosed.  2nd Diag lesion is  80% stenosed.  2nd Mrg lesion is 90% stenosed. 1. Significant three-vessel coronary artery disease. The LAD disease appears moderate angiographically. However, it is diffusely diseased in the proximal mid segment and was highly significant by fractional flow reserve evaluation with an IFR ratio of 0.74. In addition, there is  severe stenosis in the bifurcating first diagonal, second diagonal and right coronary artery. The left circumflex is also significantly diseased in the mid to distal segment but OM branches are small and likely not graftable. 2. Normal LV systolic function and normal left ventricular end-diastolic pressure.  Recommendations: I recommend transfer to [facility] for CABG evaluation. Resume heparin drip 6 hours after sheath pull.  Diagnostic Dominance: Right    Intervention    Patient Profile     60 y.o. male with a hx of self-reported MI x3 (~5 yr prior)with subsequent PCI, HTN, current tobacco use at approximately 2 cigarettes daily,now admitted with angina, NSTEMI and had cardiac cath with significant CAD and transferred to [facility] for eval for CABG   Assessment & Plan    NSTEMI: Presented with chest pain in troponin elevation to 969.  Echo showed EF 55 to 60%.  Cath showed severe multivessel disease.  Underwent CABG x3 (LIMA-LAD, SVG-PDA, SVG-ramus) on 2/21 with Dr. Servando Snare -Continue aspirin, statin, metoprolol -Continue p.o. Lasix 40 mg daily  HTN: Has been normotensive, continue metoprolol  Tobacco use: Counseled on importance of smoking cessation  HLD : LDL 120, continue atorvastatin  Disposition: planning possible discharge tomorrow, will schedule outpatient follow-up in [place]  For questions or updates, please contact [place] Please consult [email or web address] for contact info under        Signed, Donato Heinz, MD  07/13/2020, 9:55 AM

## 2020-07-13 NOTE — Op Note (Signed)
NAME: Jeffery Sweeney, Jeffery Sweeney MEDICAL RECORD NO: [id number] ACCOUNT NO: [id number] DATE OF BIRTH: 12/18/1960 FACILITY: [facility] LOCATION: [id number] PHYSICIAN: Gwenith Daily. Tyrone Sage, MD  Operative Report   DATE OF PROCEDURE: 07/09/2020  PREOPERATIVE DIAGNOSIS:  Unstable angina with 3-vessel coronary artery disease, status post previous stent placement.  POSTOPERATIVE DIAGNOSES:  Unstable angina with 3-vessel coronary artery disease, status post previous stent placement.  SURGICAL PROCEDURE:  Coronary artery bypass grafting x3 with left internal mammary to left anterior descending coronary artery, reverse saphenous vein graft to the intermediate coronary artery, reverse saphenous vein graft to the proximal posterior  descending coronary artery with right thigh, calf and endoscopic vein harvesting of the right greater saphenous vein.  SURGEON:  Gwenith Daily. Tyrone Sage, MD  FIRST ASSISTANT:  Jari Favre, PA.  BRIEF HISTORY:  The patient is a 60 year old male with known previous coronary artery disease, having being treated with coronary stents in [place].  The details of this was unclear.  The patient presented to [facility] with increasing chest  discomfort and shortness of breath.  He was evaluated by Dr. Kirke Corin ,  a cardiac catheterization was performed demonstrating significant 3-vessel disease with multiple lesions in the proximal right coronary artery and mid right coronary artery.  He also had  significant disease in the mid to distal posterior descending that did not appear bypassable.  The circumflex proper was a small vessel that was not bypassable.  He had a moderate-sized intermediate branch supplying portion of the lateral wall and with  80% stenosis the LAD had a proximal 60-70% stenosis.  Overall, ventricular function was preserved.  The patient was right hand dominant.  He had significant decrease in palmar perfusion with occlusion of the radial artery and had previous surgery on his  right hand  and wrist.  Coronary artery bypass grafting was recommended to the patient who agreed and signed informed consent.  DESCRIPTION OF PROCEDURE:  The Swan-Ganz and arterial line monitoring was placed.  The patient underwent general endotracheal anesthesia without incident.  Skin of the chest and legs were prepped with Betadine, draped in the usual sterile manner.   Appropriate timeout was performed.  Using endoscopic vein harvesting the right greater saphenous vein from the thigh and upper calf was removed and was of good quality caliber.  Median sternotomy was performed.  The left internal mammary artery was  dissected down as pedicle graft.  The distal artery was divided and hydrostatically dilated with heparinized saline.  The mammary artery had good flow, but was relatively small.  The patient's pericardium was opened.  The ascending aorta was of normal  size.  The patient was systemically heparinized.  The ascending aorta was cannulated.  Dual stage venous cannula was placed into the right atrium.  The patient was placed on cardiopulmonary bypass at 2.4 liters per minute per meter square.  Sites of  anastomosis was dissected at epicardium.  The patient's heart was elevated.  Distal right coronary artery was of moderate sized with significant calcified dissection along the right coronary artery up to the takeoff.  The proximal posterior descending  coronary artery was a more suitable for opening of the vessel and obtain the site of anastomosis.  The heart was elevated.  The small circumflex branches arising from the AV groove were too small for bypass.  The intermediate coronary artery was  partially intramyocardial, was identified and dissected out as #2 target.  The left anterior descending coronary artery was relatively small, 1.3 mm in size and was  the third target.  Aortic crossclamp was applied and 500 mL of cold blood cardioplegia  was administered, antegrade myocardial septal temperature was  monitored throughout the crossclamp and topical cold was used.  A 500 mL of cold blood potassium cardioplegia resulted in cardiac standstill. The heart was then topical cold was applied.  The  arm was then elevated and we turned our attention to the proximal posterior descending coronary artery, first.  This vessel was opened, was moderately diseased.  There was evidence of distant disease throughout the vein graft to the posterior descending.   Using a running 7-0 Prolene a distal anastomosis was performed.  Additional cold blood cardioplegia was administered down to bone graft.  The heart was then elevated.  The intermediate coronary artery was opened and was 1.2 mm in size.  Using a running  8-0 Prolene the left segment of reverse saphenous vein grafts were anastomosed to the intermediate coronary artery.  We then turned our attention to the left anterior descending coronary artery in the mid portion of the vessel was opened.  It is 1.2 mm  in size.  Using a running 8-0 Prolene the left internal mammary artery was anastomosed to the left anterior descending coronary artery.  This released the bulldog on the mammary artery and was raise in myocardial septal temperature.  Bulldog was placed  back into the mammary artery.  Additional cold blood cardioplegia was administered down the vein grafts.  Attention was then turned to the proximal anastomoses.  Two 4 mm punch aortotomies were performed and each of the 2 vein grafts were anastomosed to  the ascending aorta.  The bulldog was removed from the mammary artery with rise in myocardial septal temperature.  The heart was allowed to passively fill and deair and the proximal anastomoses were completed.  The aortic cross-clamp was removed.  A  total crossclamp time of 92 minutes.  The patient spontaneously converted to sinus rhythm in the 70s, atrial and ventricular pacing wires were applied.  The patient was atrially paced at increased rate.  The patient had been  a long-term smoker.  He had  no significant lumps noted on the left lung, but did have evidence of emphysematous changes.  So, given bronchodilators and then ventilated with body temperature rewarmed to 37 degrees.  He was then weaned from cardiopulmonary bypass on low dose  dopamine.  Sites of anastomosis was inspected and free of bleeding.  He was decannulated in the usual fashion.  Protamine sulfate was administered.  With the operative field hemostatic.  Graft markers were applied.  A left pleural tube and a Blake  mediastinal drains were left in place.  The pericardium was loosely reapproximated.  The sternum was closed with #6 stainless steel wire.  Fascia closed with interrupted 0 Vicryl and running 3-0 Vicryl in the subcutaneous tissue with 4-0 subcuticular  stitch for the skin.  Dry dressings were applied.  Sponge and needle count was reported as correct at completion of the procedure.  The patient tolerated the procedure without obvious complication.  He did not require any blood bank blood products during  the operative procedure.  Total pump time was 139 minutes.   PUS D: 07/13/2020 10:37:48 am T: 07/13/2020 4:28:00 pm  JOB: [id number]/ [id number]

## 2020-07-13 NOTE — Progress Notes (Signed)
Mobility Specialist - Progress Note   07/13/20 1547  Mobility  Activity Ambulated in hall  Level of Assistance Standby assist, set-up cues, supervision of patient - no hands on  Assistive Device Front wheel walker  Distance Ambulated (ft) 470 ft  Mobility Response Tolerated well  Mobility performed by Mobility specialist  $Mobility charge 1 Mobility   Pt asx throughout ambulation. VSS. Pt back in bed after walk.   Mamie Levers Mobility Specialist Mobility Specialist Phone: [phone]

## 2020-07-13 NOTE — Progress Notes (Signed)
Mobility Specialist - Progress Note   07/13/20 1336  Mobility  Activity Refused mobility   Pt states he has just gotten into bed and is feeling "out of breath" and doesn't want to ambulate. Will f/u as able.   Mamie Levers Mobility Specialist Mobility Specialist Phone: [phone]

## 2020-07-13 NOTE — Progress Notes (Signed)
Jeffery Sweeney [address]       [place] [address]             [phone]                 4 Days Post-Op Procedure(s) (LRB): CORONARY ARTERY BYPASS GRAFTING (CABG) x THREE , USING LEFT INTERNAL MAMMARY ARTERY, AND RIGHT LEG GREATER SAPHENOUS VEIN HARVESTED ENDOSCOPICALLY (N/A) TRANSESOPHAGEAL ECHOCARDIOGRAM (TEE) (N/A)  LOS: 7 days   Subjective: Patient comfortable, was on o2 last night now off sat 95  Objective: Vital signs in last 24 hours: Patient Vitals for the past 24 hrs:  BP Temp Temp src Pulse Resp SpO2 Weight  07/13/20 0741 119/75 98.8 F (37.1 C) Oral -- 18 -- --  07/13/20 0737 119/75 98.8 F (37.1 C) -- (!) 106 19 90 % --  07/13/20 0700 119/75 98.8 F (37.1 C) Oral (!) 105 (!) 24 92 % --  07/13/20 0600 -- -- -- -- 18 -- --  07/13/20 0500 -- -- -- -- (!) 21 -- --  07/13/20 0409 115/84 98.6 F (37 C) Oral (!) 103 20 93 % 80.8 kg  07/12/20 2200 (!) 117/52 98.6 F (37 C) Oral (!) 111 18 91 % --  07/12/20 2014 (!) 108/59 98.4 F (36.9 C) Oral (!) 110 20 91 % --  07/12/20 1655 (!) 133/93 98.1 F (36.7 C) Oral (!) 104 18 93 % --  07/12/20 1221 137/81 98.5 F (36.9 C) Oral (!) 104 16 100 % --  07/12/20 1100 -- 97.6 F (36.4 C) Axillary -- -- -- --  07/12/20 1000 124/77 -- -- (!) 103 18 96 % --  07/12/20 0900 91/67 -- -- (!) 114 -- 93 % --    Filed Weights   07/11/20 0400 07/12/20 0500 07/13/20 0409  Weight: 85.2 kg 82.5 kg 80.8 kg    Hemodynamic parameters for last 24 hours:    Intake/Output from previous day: 02/24 0701 - 02/25 0700 In: 32 [P.O.:490] Out: 1925 [Urine:1925] Intake/Output this shift: Total I/O In: 320 [P.O.:320] Out: 250 [Urine:250]  Scheduled Meds: . aspirin EC  325 mg Oral Daily  . atorvastatin  80 mg Oral Daily  . chlorhexidine gluconate (MEDLINE KIT)  15 mL Mouth Rinse BID  . docusate sodium  200 mg Oral Daily  . enoxaparin (LOVENOX) injection  30 mg Subcutaneous QHS  . furosemide  40 mg Oral Daily  . insulin aspart   0-24 Units Subcutaneous TID AC & HS  . metoprolol tartrate  25 mg Oral BID  . pantoprazole  40 mg Oral QAC breakfast  . potassium chloride  20 mEq Oral Daily  . sodium chloride flush  3 mL Intravenous Q12H   Continuous Infusions: . sodium chloride     PRN Meds:.sodium chloride, acetaminophen, bisacodyl **OR** bisacodyl, guaiFENesin, levalbuterol, ondansetron **OR** ondansetron (ZOFRAN) IV, oxyCODONE, sodium chloride flush, traMADol  General appearance: alert, cooperative and no distress Neurologic: intact Heart: regular rate and rhythm, S1, S2 normal, no murmur, click, rub or gallop Lungs: clear to auscultation bilaterally Abdomen: soft, non-tender; bowel sounds normal; no masses,  no organomegaly Extremities: extremities normal, atraumatic, no cyanosis or edema and Homans sign is negative, no sign of DVT Wound: sternum intact  Lab Results: CBC: Recent Labs    07/12/20 0344 07/13/20 0047  WBC 9.5 9.3  HGB 10.9* 11.3*  HCT 32.6* 35.0*  PLT 149* 192   BMET:  Recent Labs    07/12/20 0344 07/13/20 0047  NA 136 136  K 3.9 3.9  CL 100 101  CO2 28 25  GLUCOSE 93 89  BUN 15 16  CREATININE 0.81 0.97  CALCIUM 8.2* 8.2*    PT/INR: No results for input(s): LABPROT, INR in the last 72 hours.   Radiology DG Chest 2 View  Result Date: 07/13/2020 CLINICAL DATA:  Postop CABG on 07/09/2020, history coronary artery disease post MI x 3, hypertension, chest soreness EXAM: CHEST - 2 VIEW COMPARISON:  07/12/2020 FINDINGS: Normal heart size post CABG. Stable mediastinal contours and pulmonary vascularity. Interval removal of RIGHT jugular line. Mild LEFT basilar atelectasis. Remaining lungs clear. No pleural effusion or pneumothorax. IMPRESSION: Improved bibasilar atelectasis. Electronically Signed   By: Lavonia Dana M.D.   On: 07/13/2020 08:15   DG Chest Port 1 View  Result Date: 07/12/2020 CLINICAL DATA:  Sore chest.  Coronary artery disease. EXAM: PORTABLE CHEST 1 VIEW COMPARISON:   07/11/2020. FINDINGS: Right IJ sheath in stable position. Prior CABG. Heart size stable. Interim near complete resolution of bilateral interstitial prominence. Low lung volumes with persistent mild bibasilar atelectasis. Persistent tiny bilateral pleural effusions. IMPRESSION: 1. Right IJ sheath in stable position. 2. Prior CABG. Interim near complete resolution of bilateral interstitial prominence. 3. Persistent mild bibasilar atelectasis. Persistent tiny bilateral pleural effusions. Electronically Signed   By: Marcello Moores  Register   On: 07/12/2020 06:50     Assessment/Plan: S/P Procedure(s) (LRB): CORONARY ARTERY BYPASS GRAFTING (CABG) x THREE , USING LEFT INTERNAL MAMMARY ARTERY, AND RIGHT LEG GREATER SAPHENOUS VEIN HARVESTED ENDOSCOPICALLY (N/A) TRANSESOPHAGEAL ECHOCARDIOGRAM (TEE) (N/A) Mobilize Diuresis d/c pacing wires Poss home in am, says his sister will come to get home and provide food after d/c    Grace Isaac MD 07/13/2020 8:25 AM

## 2020-07-14 LAB — GLUCOSE, CAPILLARY
Glucose-Capillary: 94 mg/dL (ref 70–99)
Glucose-Capillary: 92 mg/dL (ref 70–99)
Glucose-Capillary: 107 mg/dL — ABNORMAL HIGH (ref 70–99)

## 2020-07-14 MED ORDER — ASPIRIN 325 MG PO TBEC: 325.0000 mg | DELAYED_RELEASE_TABLET | Freq: Every day | ORAL | 0 refills | Status: DC

## 2020-07-14 MED ORDER — METOPROLOL TARTRATE 25 MG PO TABS: 25.0000 mg | ORAL_TABLET | Freq: Two times a day (BID) | ORAL | 1 refills | Status: DC

## 2020-07-14 MED ORDER — POTASSIUM CHLORIDE CRYS ER 20 MEQ PO TBCR: 20.0000 meq | EXTENDED_RELEASE_TABLET | Freq: Every day | ORAL | 0 refills | Status: DC

## 2020-07-14 MED ORDER — TRAMADOL HCL 50 MG PO TABS: 50.0000 mg | ORAL_TABLET | ORAL | 0 refills | Status: DC | PRN

## 2020-07-14 MED ORDER — ATORVASTATIN CALCIUM 80 MG PO TABS: 80.0000 mg | ORAL_TABLET | Freq: Every day | ORAL | 1 refills | Status: DC

## 2020-07-14 MED ORDER — ACETAMINOPHEN 325 MG PO TABS: 650.0000 mg | ORAL_TABLET | Freq: Four times a day (QID) | ORAL | Status: DC | PRN

## 2020-07-14 MED ORDER — FUROSEMIDE 40 MG PO TABS: 40.0000 mg | ORAL_TABLET | Freq: Every day | ORAL | 0 refills | Status: DC

## 2020-07-14 NOTE — Progress Notes (Signed)
Mobility Specialist: Progress Note   07/14/20 1122  Mobility  Activity Ambulated in hall  Level of Assistance Minimal assist, patient does 75% or more  Assistive Device Four wheel walker  Distance Ambulated (ft) 470 ft  Mobility Response Tolerated well  Mobility performed by Mobility specialist  $Mobility charge 1 Mobility   Pre-Mobility: 85 HR Post-Mobility: 89 HR, 112/76 BP, 93% SpO2  Pt to BR before ambulation. Pt required minA to sit EOB from supine and was standby assist once up. Pt asx during ambulation and back to bed per request.   Cristal Deer Fadumo Heng Mobility Specialist Mobility Specialist Phone: [phone]

## 2020-07-14 NOTE — Progress Notes (Signed)
Discussed IS, sternal precautions, diet, smoking cessation, exercise, and CRPII. Pt easily distracted/tangential. Will refer to [facility] CRPII.  [id number] Ethelda Chick CES, ACSM 11:02 AM 07/14/2020

## 2020-07-14 NOTE — Progress Notes (Signed)
      [address]       [place] [address]             [phone]      5 Days Post-Op Procedure(s) (LRB): CORONARY ARTERY BYPASS GRAFTING (CABG) x THREE , USING LEFT INTERNAL MAMMARY ARTERY, AND RIGHT LEG GREATER SAPHENOUS VEIN HARVESTED ENDOSCOPICALLY (N/A) TRANSESOPHAGEAL ECHOCARDIOGRAM (TEE) (N/A)   Subjective:  No new complaints.  Unsure when his ride will be able to pick him up.  + ambulation   + BM  Objective: Vital signs in last 24 hours: Temp:  [98.3 F (36.8 C)-99.1 F (37.3 C)] 98.3 F (36.8 C) (02/26 0335) Pulse Rate:  [91-109] 92 (02/26 0335) Cardiac Rhythm: Sinus tachycardia (02/25 1900) Resp:  [16-23] 16 (02/26 0335) BP: (104-128)/(66-86) 104/66 (02/26 0335) SpO2:  [90 %-94 %] 90 % (02/26 0335) Weight:  [78.1 kg] 78.1 kg (02/26 0622)  Intake/Output from previous day: 02/25 0701 - 02/26 0700 In: 680 [P.O.:680] Out: 1450 [Urine:1450]  General appearance: alert, cooperative and no distress Heart: regular rate and rhythm and mild tachycardia Lungs: clear to auscultation bilaterally Abdomen: soft, non-tender; bowel sounds normal; no masses,  no organomegaly Extremities: extremities normal, atraumatic, no cyanosis or edema Wound: clean and dry  Lab Results: Recent Labs    07/12/20 0344 07/13/20 0047  WBC 9.5 9.3  HGB 10.9* 11.3*  HCT 32.6* 35.0*  PLT 149* 192   BMET:  Recent Labs    07/12/20 0344 07/13/20 0047  NA 136 136  K 3.9 3.9  CL 100 101  CO2 28 25  GLUCOSE 93 89  BUN 15 16  CREATININE 0.81 0.97  CALCIUM 8.2* 8.2*    PT/INR: No results for input(s): LABPROT, INR in the last 72 hours. ABG    Component Value Date/Time   PHART 7.311 (L) 07/09/2020 2124   HCO3 23.5 07/09/2020 2124   TCO2 25 07/09/2020 2124   ACIDBASEDEF 3.0 (H) 07/09/2020 2124   O2SAT 96.0 07/09/2020 2124   CBG (last 3)  Recent Labs    07/13/20 1557 07/13/20 2108 07/14/20 0619  GLUCAP 119* 119* 94    Assessment/Plan: S/P Procedure(s)  (LRB): CORONARY ARTERY BYPASS GRAFTING (CABG) x THREE , USING LEFT INTERNAL MAMMARY ARTERY, AND RIGHT LEG GREATER SAPHENOUS VEIN HARVESTED ENDOSCOPICALLY (N/A) TRANSESOPHAGEAL ECHOCARDIOGRAM (TEE) (N/A)  1. CV- Sinus Tachycardia, BP stable- continue Lopressor 2. Pulm- no acute issues, continue IS 3. Renal- creatinine has been stable, weight is mildly elevated, will continue Lasix as recommended by Cardiology 4. CBGs- well controlled, not a diabetic 5. dispo- patient stable, will d/c home today   LOS: 8 days    Lowella Dandy, PA-C 07/14/2020

## 2020-07-17 ENCOUNTER — Telehealth: Payer: Self-pay

## 2020-07-17 NOTE — Telephone Encounter (Signed)
Patient contacted the office requesting he drive. Contacted patient back, left voicemail message advising against driving at this point as patient is one week post-op from CABG x3 07/09/20 with Dr. Tyrone Sage.  Will await return call if warranted.

## 2020-07-23 ENCOUNTER — Ambulatory Visit (INDEPENDENT_AMBULATORY_CARE_PROVIDER_SITE_OTHER): Payer: Self-pay | Admitting: Physician Assistant

## 2020-07-23 ENCOUNTER — Encounter: Payer: Self-pay | Admitting: Physician Assistant

## 2020-07-23 ENCOUNTER — Other Ambulatory Visit: Payer: Self-pay

## 2020-07-23 VITALS — BP 110/80 | HR 89 | Ht 63.0 in | Wt 170.0 lb

## 2020-07-23 DIAGNOSIS — I10 Essential (primary) hypertension: Secondary | ICD-10-CM

## 2020-07-23 DIAGNOSIS — I214 Non-ST elevation (NSTEMI) myocardial infarction: Secondary | ICD-10-CM

## 2020-07-23 DIAGNOSIS — Z951 Presence of aortocoronary bypass graft: Secondary | ICD-10-CM

## 2020-07-23 DIAGNOSIS — I252 Old myocardial infarction: Secondary | ICD-10-CM

## 2020-07-23 DIAGNOSIS — Z87891 Personal history of nicotine dependence: Secondary | ICD-10-CM

## 2020-07-23 DIAGNOSIS — I2510 Atherosclerotic heart disease of native coronary artery without angina pectoris: Secondary | ICD-10-CM

## 2020-07-23 DIAGNOSIS — I5032 Chronic diastolic (congestive) heart failure: Secondary | ICD-10-CM

## 2020-07-23 NOTE — Progress Notes (Unsigned)
Office Visit    Patient Name: Jeffery Sweeney Date of Encounter: 07/24/2020  PCP:  Patient, No Pcp Per   [facility]  Cardiologist:  Lorine Bears, MD  Advanced Practice Provider:  No care team member to display Electrophysiologist:  None   :[id number]}   Chief Complaint    Chief Complaint  Patient presents with  . Other    Follow up post CABG - Meds reviewed verbally with patient.     41 to male with a hx of self-reported MI x3 (~5 yr prior) with subsequent PCI and CABGx3, HTN, recent tobacco use at approximately 2 cigarettes daily (quit after CABG), HLD, and seen today for follow-up after NSTEMI with LHC and transfer to [facility] for CABGx3.  Past Medical History    Past Medical History:  Diagnosis Date  . Hypertension   . Multiple vessel coronary artery disease   . Myocardial infarct (HCC)    x 3; PCI with single stent placement for each (in [place])  . NSTEMI (non-ST elevated myocardial infarction) [facility])    LHC 2/18 with multivessel CAD and transfer to [facility] for CABG eval  . Tobacco use    2 cigarettes daily   Past Surgical History:  Procedure Laterality Date  . CORONARY ANGIOPLASTY WITH STENT PLACEMENT     in [place] in 2018-19  . CORONARY ARTERY BYPASS GRAFT N/A 07/09/2020   Procedure: CORONARY ARTERY BYPASS GRAFTING (CABG) x THREE , USING LEFT INTERNAL MAMMARY ARTERY, AND RIGHT LEG GREATER SAPHENOUS VEIN HARVESTED ENDOSCOPICALLY;  Surgeon: Delight Ovens, MD;  Location: [facility] OR;  Service: Open Heart Surgery;  Laterality: N/A;  . INTRAVASCULAR PRESSURE WIRE/FFR STUDY N/A 07/06/2020   Procedure: INTRAVASCULAR PRESSURE WIRE/FFR STUDY;  Surgeon: Iran Ouch, MD;  Location: [facility] INVASIVE CV LAB;  Service: Cardiovascular;  Laterality: N/A;  LAD  . LEFT HEART CATH AND CORONARY ANGIOGRAPHY N/A 07/06/2020   Procedure: LEFT HEART CATH AND CORONARY ANGIOGRAPHY;  Surgeon: Iran Ouch, MD;  Location: [facility] INVASIVE CV LAB;  Service:  Cardiovascular;  Laterality: N/A;  . TEE WITHOUT CARDIOVERSION N/A 07/09/2020   Procedure: TRANSESOPHAGEAL ECHOCARDIOGRAM (TEE);  Surgeon: Delight Ovens, MD;  Location: [facility] OR;  Service: Open Heart Surgery;  Laterality: N/A;  . WRIST SURGERY Left    for cyst    Allergies  No Known Allergies  History of Present Illness    Jeffery Sweeney is a 60 y.o. male with PMH as above.   He reports a previous history of MI x3with PCIin [place] ~5 years ago.He is a current smoker2 cigarettes daily and reports smoking for at least 30-40years.He works in a kitchen and stays busy but does not have a regular workout routine. He [place] cardiologist within [facility] after moving to [place] in 2019. He denies taking any of his cardiac medications.  On 07/04/2020, he woke with CP and SOB, similar to that before his previous MIs. CP was described as a pressure or "bear-hug" that only alleviated when breathing out very slowly. He has since continued to have episodes of CP.  He reported one episode of worsening chest pain while putting away dishes since that time.  His worst or longest episode occurred last night (07/05/2020), lasting hours.He tried Tums and antiacids with no relief. He decided to call 911. No associated nausea or emesis. No signs or symptoms of bleeding. No abdominal distention or lower extremity edema.  EMS arrived and administered nitro and ASA 324 mg in route. He  reported initial improvement of his CP with start of heparin and nitro but then had recurrent symptoms/CP. In the ED at [facility], vitals significant for BP 158/121, HR 101 bpm.Labs significant for glucose 113, high-sensitivity troponin [id number],renal function stable atCr0.95, BUN 17, H&H stable.EKG showed sinus tachycardia, 105 bpm,slightdepression noted in lateralI, V5, V6 andanteriorV2-V4 and not meeting criteria.Chest x-ray without active disease.   At the time of cardiology consultation 2/18, he  reported ongoing CP and chest soreness.  He also reported left leg pain with walking, starting at the hip with diminished left femoral pulse on exam. Same day 2/18 LHC performed as copied and pasted below and showed significant three-vessel CAD.  LAD disease appeared moderate by angiography.  It was diffusely diseased in the proximal mid segment and highly significant by FFR with IFR ratio of 0.74.  Also noted was severe stenosis in the bifurcating first diagonal, second diagonal, and RCA.  Left circumflex was also significantly diseased in the mid to distal segment.  OM branches were noted to be small and likely not graftable.  LVSF and LVEDP normal.  Subsequent echo showed EF 55 to 60%, NR WMA, mild LVH, GLS -15.2%.  Recommendation was for transfer to [facility] for CABG evaluation. He was transferred to [facility] and underwent CABG x3 (LIMA-LAD, rSVG-PDA, SVG-ramus) on 2/21 with Dr. Tyrone Sage.  Pre-CABG carotid ultrasound showed bilateral vessels near normal with only minimal thickening or plaque.  Right and left ABI within normal range without significant evidence of arterial disease.  Outpatient vascular evaluation for L leg claudication was recommended, as well as smoking cessation.  Today, 07/23/20, he presents to clinic and notes that he has been doing well since his CABG.  He does report some left side sternal incision numbness, or rather numbness to the left side of his chest.  He reports some soreness to the right side of the sternal incision and along the incision site.  He feels that his incision sites are healing well and denies any signs of infection or swelling.  Of note, he has ongoing right radial reduced pulse that has been referenced in previous documentation and reportedly due to previous surgery on his right hand and wrist, as well as recent radial artery cath.  He perfuses to R hand on inspection today. He has some mild right side lower extremity swelling today with consideration of right SVG  harvesting.  He does note some left leg soreness, especially on the side of his leg when walking as previously described above.  ABIs performed pre-CABG and with normal left side ABI, though reduced left femoral pulse was noted during his catheterization on 2/18.  He denies any groin pain or swelling.  He reports that he has been very active since his bypass surgery.  He has been doing stairs and walking around the house without a walker.    He denies any exertional chest pain.  No racing heart rate or palpitations.  No presyncope or syncope.  No abdominal distention, PND, orthopnea, or early satiety.  No signs or symptoms of bleeding.  He states that he wishes he could return to work and start driving.  He has not yet started cardiac rehab.  He is not checking his blood pressure at home and may need a BP cuff through social workers.  He does report home HR 55-60bpm.  No daily weights.  He quit smoking and reports that he has not smoked since that time.  He was only prescribed 7 days of Lasix 40  mg and potassium supplementation.  He reports that he ran out yesterday.  As below, he is euvolemic on exam with BP soft and recommendation to reassess at RTC and collect labs today.  Of note, given no post CABG EKG, EKG inspected with DOD, Dr. Mariah MillingGollan, same day given diffuse T wave inversion.  Home Medications    Current Outpatient Medications on File Prior to Visit  Medication Sig Dispense Refill  . acetaminophen (TYLENOL) 325 MG tablet Take 2 tablets (650 mg total) by mouth every 6 (six) hours as needed for mild pain.    Marland Kitchen. aspirin EC 325 MG EC tablet Take 1 tablet (325 mg total) by mouth daily. 30 tablet 0  . atorvastatin (LIPITOR) 80 MG tablet Take 1 tablet (80 mg total) by mouth daily. 30 tablet 1  . furosemide (LASIX) 40 MG tablet Take 1 tablet (40 mg total) by mouth daily. 7 tablet 0  . metoprolol tartrate (LOPRESSOR) 25 MG tablet Take 1 tablet (25 mg total) by mouth 2 (two) times daily. 60 tablet 1  .  potassium chloride SA (KLOR-CON) 20 MEQ tablet Take 1 tablet (20 mEq total) by mouth daily. 7 tablet 0  . traMADol (ULTRAM) 50 MG tablet Take 1 tablet (50 mg total) by mouth every 4 (four) hours as needed for moderate pain. 30 tablet 0   No current facility-administered medications on file prior to visit.    Review of Systems    He denies palpitations, dyspnea, pnd, orthopnea, n, v, dizziness, syncope, edema, weight gain, or early satiety.  He reports left-sided sternal chest numbness and right-sided chest pain with sternal incision soreness but otherwise chest incision sites healing well without signs of swelling or infection.  He reports left leg pain to the side of his calf/claudication symptoms when walking.  He has right lower extremity tenderness and mild swelling when compared with that of the left lower extremity and with consideration of his right thigh and upper calf greater saphenous vein graft harvesting.  Please see below for inspection of all CABG sites, as well as inspection of his previous LHC right radial arteriotomy site.  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 He perfuses well to his R wrist on inspection /eval today.  All other systems reviewed and are otherwise negative except as noted above.  Physical Exam    VS:  BP 110/80 (BP Location: Left Arm, Patient Position: Sitting, Cuff Size: Normal)   Pulse 89   Ht 5\' 3"  (1.6 m)   Wt 170 lb (77.1 kg)   SpO2 97%   BMI 30.11 kg/m  , BMI Body mass index is 30.11 kg/m. GEN: Well nourished, well developed, in no acute distress. HEENT: normal. Neck: Supple, no JVD, carotid bruits, or masses. Cardiac: RRR, no murmurs, rubs, or gallops. No clubbing, cyanosis. RLEE noted with some tenderness. R leg incision site inspected given R SVG grafting and healing well without swelling or infection. Right leg  with mild LEE with consideration of right endoscopic vein harvesting from the right upper thigh/calf.  Bilateral pedal pulses inspected and 2+ bilaterally. No LEE of left leg, though he does report left leg side calf pain which he states is a chronic finding.  All chest incision sites inspected, as well as right radial arteriotomy site from his LHC on 218 and without any signs of infection or erythema / hematoma / bruit and healing  well. R radial arteriotomy site stable and without bruit or hematoma. Reduced 1+ R pulse which pt states is chronic finding due to previous surgery on R hand and wrist. No hematoma of R wrist and appears to be perfusing adequately despite reduced pulse.  He also reports chronic nerve issues with the right wrist.  L radial pulse 2+ Respiratory:  Respirations regular and unlabored, clear to auscultation bilaterally. GI: Soft, nontender, nondistended, BS + x 4. MS: no deformity or atrophy. Skin: warm and dry, no rash. Neuro:  Strength and sensation are intact. Psych: Normal affect.  Accessory Clinical Findings    ECG personally reviewed by me today - NSR, 89 bpm, diffuse T wave inversion noted in leads II, 3, aVF, V1 to V6 not seen on prior EKGs are reviewed with DOD same day as no post CABG EKG- no acute changes.  VITALS Reviewed today   Temp Readings from Last 3 Encounters:  07/14/20 98.2 F (36.8 C) (Oral)  07/06/20 98.1 F (36.7 C) (Oral)  06/28/20 98 F (36.7 C) (Oral)   BP Readings from Last 3 Encounters:  07/23/20 110/80  07/14/20 112/76  07/06/20 98/65   Pulse Readings from Last 3 Encounters:  07/23/20 89  07/14/20 97  07/06/20 64    Wt Readings from Last 3 Encounters:  07/23/20 170 lb (77.1 kg)  07/14/20 172 lb 1.6 oz (78.1 kg)  07/06/20 172 lb 2.9 oz (78.1 kg)     LABS  reviewed today   Lab Results  Component Value Date   WBC 9.3 07/13/2020   HGB 11.3 (L) 07/13/2020   HCT 35.0 (L) 07/13/2020   MCV 90.9 07/13/2020   PLT 192 07/13/2020    Lab Results  Component Value Date   CREATININE 0.97 07/13/2020   BUN 16 07/13/2020   NA 136 07/13/2020   K 3.9 07/13/2020   CL 101 07/13/2020   CO2 25 07/13/2020   Lab Results  Component Value Date   ALT 14 07/08/2020   AST 18 07/08/2020   ALKPHOS 59 07/08/2020   BILITOT 0.4 07/08/2020   Lab Results  Component Value Date   CHOL 182 07/08/2020   HDL 34 (L) 07/08/2020   LDLCALC 120 (H) 07/08/2020   TRIG 141 07/08/2020   CHOLHDL 5.4 07/08/2020    Lab Results  Component Value Date   HGBA1C 5.7 (H) 07/08/2020   No results found for: TSH   STUDIES/PROCEDURES reviewed today   PostOp Echo 07/09/20 Left Ventricle: The left ventricle is unchanged from pre-bypass.  - Right Ventricle: The right ventricle appears unchanged from pre-bypass.  - Aorta: The aorta appears unchanged from pre-bypass.  - Left Atrium: The left atrium appears unchanged from pre-bypass.  - Left Atrial Appendage: The left atrial appendage appears unchanged from  pre-bypass.  - Aortic Valve: The aortic valve appears unchanged from pre-bypass.  - Mitral Valve: The mitral valve appears unchanged from pre-bypass.  - Tricuspid Valve: The tricuspid valve appears unchanged from pre-bypass.  - Interatrial Septum: The interatrial septum appears unchanged from  pre-bypass.  - Interventricular Septum: The interventricular septum appears unchanged  from  pre-bypass.  - Pericardium: The pericardium appears unchanged from pre-bypass.   Vas [place] PreCABG 07/09/20 Summary:  Right Carotid: The extracranial vessels were near-normal with only minimal  wall thickening or plaque.  Left Carotid: The extracranial vessels were near-normal with only minimal  wall        thickening or plaque.  Vertebrals: Bilateral vertebral arteries demonstrate antegrade flow.  Right  ABI: Resting right ankle-brachial index is within normal range. No  evidence of significant right lower extremity arterial disease.  Left ABI: Resting  left ankle-brachial index is within normal range. No  evidence of significant left lower extremity arterial disease.  Right Upper Extremity: Doppler waveforms remain within normal limits with  right radial compression. Doppler waveforms decrease >50% with right ulnar  compression.  Left Upper Extremity: Doppler waveforms decrease >50% with left radial  compression. Doppler waveforms decrease >50% with left ulnar compression.   Echo 07/06/20 1. Left ventricular ejection fraction, by estimation, is 55 to 60%. The  left ventricle has normal function. The left ventricle has no regional  wall motion abnormalities. There is mild left ventricular hypertrophy.  Left ventricular diastolic parameters  were normal. The average left ventricular global longitudinal strain is  -15.2 %. The global longitudinal strain is abnormal.  2. Right ventricular systolic function is normal. The right ventricular  size is normal.  3. The mitral valve is normal in structure. No evidence of mitral valve  regurgitation. No evidence of mitral stenosis.  4. The aortic valve is normal in structure. Aortic valve regurgitation is  not visualized. No aortic stenosis is present.  5. The inferior vena cava is normal in size with greater than 50%  respiratory variability, suggesting right atrial pressure of 3 mmHg.   LHC 07/06/2020  The left ventricular systolic function is normal.  LV end diastolic pressure is normal.  The left ventricular ejection fraction is 55-65% by visual estimate.  Prox RCA lesion is 80% stenosed.  Dist RCA lesion is 30% stenosed.  RPDA lesion is 90% stenosed.  Prox LAD to Mid LAD lesion is 60% stenosed.  1st Diag lesion is 90% stenosed.  Lat 1st Diag lesion is 90% stenosed.  Mid Cx to Dist Cx lesion is 90% stenosed.  2nd Diag lesion is 80% stenosed.  2nd Mrg lesion is 90% stenosed. 1. Significant three-vessel coronary artery disease. The LAD disease appears moderate  angiographically. However, it is diffusely diseased in the proximal mid segment and was highly significant by fractional flow reserve evaluation with an IFR ratio of 0.74. In addition, there is severe stenosis in the bifurcating first diagonal, second diagonal and right coronary artery. The left circumflex is also significantly diseased in the mid to distal segment but OM branches are small and likely not graftable. 2. Normal LV systolic function and normal left ventricular end-diastolic pressure. Recommendations: I recommend transfer to [facility] for CABG evaluation. Resume heparin drip 6 hours after sheath pull.  Assessment & Plan   S/p CABG x3 S/p NSTEMI Self-reported history of MI x3 with PCI --No CP.   Echo with EF 55 to 60%.  Cath showed severe multivessel disease.  S/p CABG x3 at [facility] 2/21 as above with LIMA to LAD, SVG to PDA, SVG to ramus by Dr. Tyrone Sage.  Please see above for very detailed inspection of all CABG graft incision sites/previous LHC right radial site.  All incisions and right radial arteriotomy site are healing well.  He does have chronic reduced right radial pulse and associated pain, which he states is due to previous right hand surgery.  This is noted on the CTS documentation / previous notes.  No hematoma noted today or any concerning findings during a detailed exam - R hand perfused on exam.  EKG and case today reviewed with DOD, Dr. Mariah Milling, given diffuse T wave inversion.  Plan is to continue ASA, statin, Lopressor.  At RTC,  if the BP still elevated and continued room in heart rate, recommend transition from Lopressor to carvedilol time for more optimal control of DBP.  Aggressive risk factor modification.  He has quit smoking and was congratulated on this today.  We reviewed ongoing precautions s/p CABG and lifestyle changes in detail today.  Heart healthy diet, fluid and salt restrictions reviewed in detail.  We will confirm that he is set up to complete  cardiac rehab today.  At RTC, he will need repeat EKG.  He is aware that he should contact the office with any concerning symptoms, reviewed in detail today. Follow-up with CTS as directed.  Chronic HFpEF --No SOB/DOE.  Echo with EF 55 to 60%.  Intraoperative echo without changes from that of previous pre-CABG echo.  He was prescribed 7 days of Lasix 40 mg daily with potassium supplementation.  On exam today, he is euvolemic, despite discontinuing his diuresis 1 day prior to his clinic visit.  Given he is euvolemic on exam today with soft BP, and pending repeat labs / BMET  to be collected today, we will hold off on refilling this medication for now.  Reassess his volume status at RTC.  HTN, BP goal <130/80 --Current BP well controlled to soft at 110/80.  Continue current medications -at RTC, consider transition from Lopressor to Coreg, as this is GDMT and better targets DBP control. Low-salt diet and fluid restriction reviewed today.  We will ensure he has a BP cuff and contact social work if needed.  HLD, LDL goal <70 --06/2020 LDL 191. Repeat lipid and liver in 6-8 weeks. LDL goal below 70.  Recent previous tobacco use --Quit smoking after discharge.  He reports complete smoking cessation.  Congratulations provided.   Medication changes: None Labs ordered: CBC, BMET Studies / Imaging ordered: None.  Reach out to social workers for BP cuff. Future considerations: At RTC, if DBP still elevated, recommend transition from Lopressor to carvedilol for more optimal control of DBP.  Repeat lipid and liver in 6-8 weeks. Pending results of BMET and fluid status at RTC, consider if patient needs a standing diuretic at that time.  We will hold off on refills for his Lasix today, given he is euvolemic on exam and soft BP today.  He will need a repeat EKG at RTC, given his diffuse T wave inversion on his EKG today, which was reviewed with DOD and case discussed with DOD.  Left-sided ABI is normal pre-CABG,  however recommend keep a close eye on left side claudication-like symptoms and as per Dr. Kirke Corin recommendation.  Disposition: RTC 1-2 weeks   Lennon Alstrom, PA-C

## 2020-07-23 NOTE — Patient Instructions (Signed)
  Medication Instructions:   1. Your physician recommends that you continue on your current medications as directed. Please refer to the Current Medication list given to you today.  *If you need a refill on your cardiac medications before your next appointment, please call your pharmacy*   Lab Work:  1. None Ordered  If you have labs (blood work) drawn today and your tests are completely normal, you will receive your results only by: Marland Kitchen MyChart Message (if you have MyChart) OR . A paper copy in the mail If you have any lab test that is abnormal or we need to change your treatment, we will call you to review the results.   Testing/Procedures:  1. None Ordered   Follow-Up: At [facility], you and your health needs are our priority.  As part of our continuing mission to provide you with exceptional heart care, we have created designated Provider Care Teams.  These Care Teams include your primary Cardiologist (physician) and Advanced Practice Providers (APPs -  Physician Assistants and Nurse Practitioners) who all work together to provide you with the care you need, when you need it.  We recommend signing up for the patient portal called "MyChart".  Sign up information is provided on this After Visit Summary.  MyChart is used to connect with patients for Virtual Visits (Telemedicine).  Patients are able to view lab/test results, encounter notes, upcoming appointments, etc.  Non-urgent messages can be sent to your provider as well.   To learn more about what you can do with MyChart, go to [email or web address].    Your next appointment:   1-2 week follow up   The format for your next appointment:   In Person  Provider:   You will see one of the following Advanced Practice Providers on your designated Care Team:     Marisue Ivan, [place]

## 2020-07-26 ENCOUNTER — Telehealth: Payer: Self-pay | Admitting: Physician Assistant

## 2020-07-26 NOTE — Telephone Encounter (Signed)
Return Mr. Flater phone call regarding a BP cuff, advised per Visser's PA-C progress notes at last visit on 3/7 "We will ensure he has a BP cuff and contact social work if needed". Advised Mr. Nedved that SW will reach out regarding BP cuff, if he hasn't heard anything it can be readdress at his f/u visit on 3/18 (next week). Pt verbalized understanding, nothing further at this time. Grateful for the update and return call.

## 2020-07-26 NOTE — Telephone Encounter (Signed)
Patient calling to check status of assistance with getting bp home cuff   Please call to update.

## 2020-07-27 ENCOUNTER — Telehealth: Payer: Self-pay | Admitting: Licensed Clinical Social Worker

## 2020-07-27 NOTE — Telephone Encounter (Signed)
CSW referred to assist patient with obtaining a BP cuff. CSW contacted patient to inform cuff will be delivered to home. Patient grateful for support and assistance. CSW available as needed. Jackie Ramiel Forti, LCSW, CCSW-MCS [phone]  

## 2020-08-03 ENCOUNTER — Ambulatory Visit (INDEPENDENT_AMBULATORY_CARE_PROVIDER_SITE_OTHER): Payer: Self-pay | Admitting: Physician Assistant

## 2020-08-03 ENCOUNTER — Encounter: Payer: Self-pay | Admitting: Physician Assistant

## 2020-08-03 ENCOUNTER — Other Ambulatory Visit
Admission: RE | Admit: 2020-08-03 | Discharge: 2020-08-03 | Disposition: A | Discharge status: Home / Self Care | Payer: Self-pay | Source: Ambulatory Visit | Attending: Physician Assistant | Admitting: Physician Assistant

## 2020-08-03 ENCOUNTER — Other Ambulatory Visit: Payer: Self-pay

## 2020-08-03 VITALS — BP 120/80 | HR 88 | Ht 63.0 in | Wt 181.0 lb

## 2020-08-03 DIAGNOSIS — I214 Non-ST elevation (NSTEMI) myocardial infarction: Secondary | ICD-10-CM | POA: Insufficient documentation

## 2020-08-03 DIAGNOSIS — R9431 Abnormal electrocardiogram [ECG] [EKG]: Secondary | ICD-10-CM

## 2020-08-03 DIAGNOSIS — Z951 Presence of aortocoronary bypass graft: Secondary | ICD-10-CM

## 2020-08-03 DIAGNOSIS — I2510 Atherosclerotic heart disease of native coronary artery without angina pectoris: Secondary | ICD-10-CM

## 2020-08-03 DIAGNOSIS — E785 Hyperlipidemia, unspecified: Secondary | ICD-10-CM

## 2020-08-03 DIAGNOSIS — I252 Old myocardial infarction: Secondary | ICD-10-CM

## 2020-08-03 DIAGNOSIS — Z87891 Personal history of nicotine dependence: Secondary | ICD-10-CM

## 2020-08-03 DIAGNOSIS — I10 Essential (primary) hypertension: Secondary | ICD-10-CM

## 2020-08-03 DIAGNOSIS — T8149XA Infection following a procedure, other surgical site, initial encounter: Secondary | ICD-10-CM

## 2020-08-03 DIAGNOSIS — I5032 Chronic diastolic (congestive) heart failure: Secondary | ICD-10-CM | POA: Insufficient documentation

## 2020-08-03 LAB — LIPID PANEL
Cholesterol: 135 mg/dL (ref 0–200)
HDL: 38 mg/dL — ABNORMAL LOW (ref >40)
LDL Cholesterol: 63 mg/dL (ref 0–99)
Total CHOL/HDL Ratio: 3.6 RATIO
Triglycerides: 170 mg/dL — ABNORMAL HIGH (ref <150)
VLDL: 34 mg/dL (ref 0–40)

## 2020-08-03 LAB — COMPREHENSIVE METABOLIC PANEL
ALT: 18 U/L (ref 0–44)
AST: 13 U/L — ABNORMAL LOW (ref 15–41)
Albumin: 3.4 g/dL — ABNORMAL LOW (ref 3.5–5.0)
Alkaline Phosphatase: 83 U/L (ref 38–126)
Anion gap: 7 (ref 5–15)
BUN: 10 mg/dL (ref 6–20)
CO2: 28 mmol/L (ref 22–32)
Calcium: 9 mg/dL (ref 8.9–10.3)
Chloride: 104 mmol/L (ref 98–111)
Creatinine, Ser: 0.98 mg/dL (ref 0.61–1.24)
GFR, Estimated: >60 mL/min (ref >60)
Glucose, Bld: 105 mg/dL — ABNORMAL HIGH (ref 70–99)
Potassium: 3.7 mmol/L (ref 3.5–5.1)
Sodium: 139 mmol/L (ref 135–145)
Total Bilirubin: 0.9 mg/dL (ref 0.3–1.2)
Total Protein: 6.9 g/dL (ref 6.5–8.1)

## 2020-08-03 LAB — CBC
HCT: 38.6 % — ABNORMAL LOW (ref 39.0–52.0)
Hemoglobin: 12.6 g/dL — ABNORMAL LOW (ref 13.0–17.0)
MCH: 29.2 pg (ref 26.0–34.0)
MCHC: 32.6 g/dL (ref 30.0–36.0)
MCV: 89.4 fL (ref 80.0–100.0)
Platelets: 286 10*3/uL (ref 150–400)
RBC: 4.32 MIL/uL (ref 4.22–5.81)
RDW: 12.9 % (ref 11.5–15.5)
WBC: 9.7 10*3/uL (ref 4.0–10.5)
nRBC: 0 % (ref 0.0–0.2)

## 2020-08-03 LAB — BRAIN NATRIURETIC PEPTIDE: B Natriuretic Peptide: 66.5 pg/mL (ref 0.0–100.0)

## 2020-08-03 MED ORDER — POTASSIUM CHLORIDE CRYS ER 20 MEQ PO TBCR: 20.0000 meq | EXTENDED_RELEASE_TABLET | Freq: Every day | ORAL | 5 refills | Status: DC

## 2020-08-03 MED ORDER — FUROSEMIDE 40 MG PO TABS: 40.0000 mg | ORAL_TABLET | Freq: Every day | ORAL | 5 refills | Status: DC

## 2020-08-03 MED ORDER — CEPHALEXIN 500 MG PO CAPS
500.0000 mg | ORAL_CAPSULE | Freq: Four times a day (QID) | ORAL | 0 refills | Status: AC
Start: 2020-08-03 — End: 2020-08-08

## 2020-08-03 NOTE — Progress Notes (Signed)
Office Visit    Patient Name: Jeffery Sweeney Date of Encounter: 08/03/2020  PCP:  Patient, No Pcp Per   [facility]  Cardiologist:  Lorine Bears, MD  Advanced Practice Provider:  No care team member to display Electrophysiologist:  None   :[id number]}   Chief Complaint    Chief Complaint  Patient presents with  . Follow-up    Patient c/o stabbing pain under right armpit, trouble swallowing the potassium pill and soreness in mid-sternum. Medications reviewed by the patient verbally.      60 to male with a hx of self-reported MI x3 (~5 yr prior) with subsequent PCI and CABGx3, HTN, recent tobacco use at approximately 2 cigarettes daily (quit after CABG), HLD, and seen today for follow-up after NSTEMI with LHC and transfer to [facility] for CABGx3.  Past Medical History    Past Medical History:  Diagnosis Date  . Hypertension   . Multiple vessel coronary artery disease   . Myocardial infarct (HCC)    x 3; PCI with single stent placement for each (in [place])  . NSTEMI (non-ST elevated myocardial infarction) [facility])    LHC 2/18 with multivessel CAD and transfer to [facility] for CABG eval  . Tobacco use    2 cigarettes daily   Past Surgical History:  Procedure Laterality Date  . CORONARY ANGIOPLASTY WITH STENT PLACEMENT     in [place] in 2018-19  . CORONARY ARTERY BYPASS GRAFT N/A 07/09/2020   Procedure: CORONARY ARTERY BYPASS GRAFTING (CABG) x THREE , USING LEFT INTERNAL MAMMARY ARTERY, AND RIGHT LEG GREATER SAPHENOUS VEIN HARVESTED ENDOSCOPICALLY;  Surgeon: Delight Ovens, MD;  Location: [facility] OR;  Service: Open Heart Surgery;  Laterality: N/A;  . INTRAVASCULAR PRESSURE WIRE/FFR STUDY N/A 07/06/2020   Procedure: INTRAVASCULAR PRESSURE WIRE/FFR STUDY;  Surgeon: Iran Ouch, MD;  Location: [facility] INVASIVE CV LAB;  Service: Cardiovascular;  Laterality: N/A;  LAD  . LEFT HEART CATH AND CORONARY ANGIOGRAPHY N/A 07/06/2020   Procedure: LEFT HEART CATH AND  CORONARY ANGIOGRAPHY;  Surgeon: Iran Ouch, MD;  Location: [facility] INVASIVE CV LAB;  Service: Cardiovascular;  Laterality: N/A;  . TEE WITHOUT CARDIOVERSION N/A 07/09/2020   Procedure: TRANSESOPHAGEAL ECHOCARDIOGRAM (TEE);  Surgeon: Delight Ovens, MD;  Location: [facility] OR;  Service: Open Heart Surgery;  Laterality: N/A;  . WRIST SURGERY Left    for cyst    Allergies  No Known Allergies  History of Present Illness    [name] is a 60 y.o. male with PMH as above.   He reports a previous history of MI x3with PCIin [place] ~5 years ago.He is a current smoker2 cigarettes daily and reports smoking for at least 30-40years.He works in a kitchen and stays busy but does not have a regular workout routine. He [place] cardiologist within [facility] after moving to [place] in 2019. He denies taking any of his cardiac medications.  On 07/04/2020, he woke with CP and SOB, similar to that before his previous MIs. CP was described as a pressure or "bear-hug" that only alleviated when breathing out very slowly. He has since continued to have episodes of CP.  He reported one episode of worsening chest pain while putting away dishes since that time.  His worst or longest episode occurred last night (07/05/2020), lasting hours.He tried Tums and antiacids with no relief. He decided to call 911. No associated nausea or emesis. No signs or symptoms of bleeding. No abdominal distention or lower extremity edema.  EMS arrived and administered nitro and ASA 324 mg in route. He reported initial improvement of his CP with start of heparin and nitro but then had recurrent symptoms/CP. In the ED at [facility], vitals significant for BP 158/121, HR 101 bpm.Labs significant for glucose 113, high-sensitivity troponin [id number],renal function stable atCr0.95, BUN 17, H&H stable.EKG showed sinus tachycardia, 105 bpm,slightdepression noted in lateralI, V5, V6 andanteriorV2-V4 and not meeting  criteria.Chest x-ray without active disease.   At the time of cardiology consultation 2/18, he reported ongoing CP and chest soreness.  He also reported left leg pain with walking, starting at the hip with diminished left femoral pulse on exam. Same day 2/18 LHC performed as copied and pasted below and showed significant three-vessel CAD.  LAD disease appeared moderate by angiography.  It was diffusely diseased in the proximal mid segment and highly significant by FFR with IFR ratio of 0.74.  Also noted was severe stenosis in the bifurcating first diagonal, second diagonal, and RCA.  Left circumflex was also significantly diseased in the mid to distal segment.  OM branches were noted to be small and likely not graftable.  LVSF and LVEDP normal.  Subsequent echo showed EF 55 to 60%, NR WMA, mild LVH, GLS -15.2%.  Recommendation was [facility]. [facility] and underwent CABG x3 (LIMA-LAD, rSVG-PDA, SVG-ramus) on 2/21 with Dr. Tyrone SageGerhardt.  Pre-CABG carotid ultrasound showed bilateral vessels near normal with only minimal thickening or plaque.  Right and left ABI within normal range without significant evidence of arterial disease.  Outpatient vascular evaluation for L leg claudication was recommended, as well as smoking cessation.  When last seen in clinic 3/7, he reported left side sternal incision numbness, or rather numbness to the left side of his chest.  He had some soreness to the right side of the sternal incision and along the incision site.  No signs of infection or swelling.  Ongoing right radial reduced pulse due to previous surgery on his right hand and wrist, as well as recent radial artery cath, was noted.  He perfused to R hand well. He had some mild right side lower extremity swelling with consideration of right SVG harvesting.  He noted some left leg soreness, especially on the side of his leg when walking as previously described above.  ABIs  performed pre-CABG and with normal left side ABI, though reduced left femoral pulse was noted during his catheterization on 2/18.  He denied any groin pain or swelling.  He reported that he had been very active since his bypass surgery.  He was doing stairs and walking around the house without a walker.  Of note, given no post CABG EKG, EKG inspected with DOD, Dr. Mariah MillingGollan, same day given diffuse T wave inversion. Recommendation was for close follow-up by DOD that day.  Today, 3/18, he continues to deny sx of any exertional chest pain or CP at rest. He does note dyspnea and denies SOB at rest, though SOB noted on exam with work of breathing. He has LE edema and significant wt gain. He reports significant fatigue. He was lightheaded the day before his visit. No abdominal distention reported, though noted on exam. No  orthopnea or early satiety.  He is not eating very well and reports lots of ice cream and boxes / prepackaged foods. He is eating lasagna a lot. He eats boxed fish and not watching his salt. He reports he is not sleeping well and constantly waking  up q3h.  He reports recent stressors, as his uncle just hand a stroke and now has memory issues. 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 He is not certain which medications he is taking. He reports that he has difficulty swallowing most big pills. He reports taking lasix and potassium, though these were discontinued, and though he only had 7 pills of each at discharge. He reports urinating all the time. Unclear which medications he is taking. BP noted to be elevated here and at home. He did receive his BP cuff via social work. No signs or symptoms of bleeding.  He still  wishes he could return to work and start driving.  He has not yet started cardiac rehab. No workout routine. He denies referral to cardiac rehab.  He quit smoking and reports that he has not smoked since that time. Of note, he has pain and TTP in his right underarm and  R groin, as well as down his R leg. His pain and soreness prompted re-inspection of all incision sites again today. Incison sites healed sealed over but erythema around the scabs noted. No pus or discharge noted from the sites. No recent fevers. No significant tightness / swelling of either the R upper or lower extremities. All R sided upper and lower pulses still intact, and with consideration RUE is always 1+ as above. He reports reduced range of motion of his R leg, due to his soreness. He has not scratched at the sites or recently injured himself. He reports ongoing chest numbness to the lest of his chest incision site and today reports chest incision site tenderness. Discussed scanning his right leg and arm today with pt preference to defer for now and instead try the abx below. His right lower extremity and right upper extremity are tender on exam in the reported sore sites. No racing heart rate or palpitations.  No presyncope or syncope.  Edema, work of breathing, abdominal distention noted. Of note, after CABG, diuresis started, though only a 7 day prescription provided (though it has been noted that it was to be continued indefinitely). On review of EMR, BNP at that time was 17 with lasix started per CTS and for reference given the below ROS, BNP now 60s with work of breathing.   Home Medications    Current Outpatient Medications on File Prior to Visit  Medication Sig Dispense Refill  . acetaminophen (TYLENOL) 325 MG tablet Take 2 tablets (650 mg total) by mouth every 6 (six) hours as needed for mild pain.    Marland Kitchen aspirin EC 81 MG tablet Take 81 mg by mouth daily. Swallow whole.    Marland Kitchen atorvastatin (LIPITOR) 80 MG tablet Take 1 tablet (80 mg total) by mouth daily. 30 tablet 1  . metoprolol tartrate (LOPRESSOR) 25 MG tablet Take 1 tablet (25 mg total) by mouth 2 (two) times daily. 60 tablet 1  . traMADol (ULTRAM) 50 MG tablet Take 1 tablet (50 mg total) by mouth every 4 (four) hours as needed for  moderate pain. 30 tablet 0   No current facility-administered medications on file prior to visit.    Review of Systems    He denies palpitations, orthopnea, n, v, syncope, or early satiety.  No CP. He reports PND, dizziness (yesterday), DOE, fatigue, wt gain, edema. He denies SOB though noted on exam. He reports left-sided sternal chest numbness and right-sided chest pain with sternal incision soreness. He has R U/LE soreness and tenderness of R armpit  and R groin.  He has right lower extremity tenderness and mild swelling when compared with that of the left lower extremity and with consideration of his right thigh and upper calf greater saphenous vein graft harvesting.  Scans discussed and deferred of both upper and lower extremities. He has ongoing claudication sx as reported of LLE. Please see below for inspection of all CABG sites, as well as inspection of his previous LHC right radial arteriotomy site.  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 He perfuses well to his R wrist on inspection /eval today.  All other systems reviewed and are otherwise negative except as noted above.  Physical Exam    VS:  BP 120/80 (BP Location: Left Arm, Patient Position: Sitting, Cuff Size: Normal)   Pulse 88   Ht  (1.6 m)   Wt 181 lb (82.1 kg)   SpO2 98%   BMI 32.06 kg/m  , BMI Body mass index is 32.06 kg/m. GEN: Well nourished, well developed, in no acute distress. Work of breathing noted. HEENT: normal. Neck: Supple, no JVD, carotid bruits, or masses. Cardiac: RRR, no murmurs, rubs, or gallops. No clubbing, cyanosis. RLEE noted with some tenderness. R leg incision site inspected given R SVG grafting and with mild LEE with consideration of right endoscopic vein harvesting from the right upper thigh/calf.  Bilateral pedal pulses inspected and 2+ bilaterally. Erythema  noted of incision site and tenderness of R groin. No significant  LEE of left leg, though he does report left leg side calf pain which he states is a chronic finding.  All chest incision sites inspected and more erythema noted than previous visit. R armpit pain noted. Right radial arteriotomy site from his LHC on 218 and without any signs of infection. Reduced 1+ R pulse which pt states is chronic finding due to previous surgery on R hand and wrist. No hematoma of R wrist with bump noted as chronic. RUE perfusing adequately despite reduced pulse.  He also reports chronic nerve issues with the right wrist.  L radial pulse 2+ Respiratory: Work of breathing noted, bibasilar reduced breath sounds. GI: Firm, distended, BS + x 4. MS: no deformity or atrophy. Skin: warm and dry, no rash. Neuro:  Strength and sensation are intact. Psych: Normal affect.  Accessory Clinical Findings    ECG personally reviewed by me today - NSR, 89 bpm, diffuse T wave inversion noted in leads II, 3, aVF, V1 to V6 not seen on prior EKGs (which are reviewed with DOD same day as no post CABG EKG)- no acute changes.  VITALS Reviewed today   Temp Readings from Last 3 Encounters:  07/14/20 98.2 F (36.8 C) (Oral)  07/06/20 98.1 F (36.7 C) (Oral)  06/28/20 98 F (36.7 C) (Oral)   BP Readings from Last 3 Encounters:  08/03/20 120/80  07/23/20 110/80  07/14/20 112/76   Pulse Readings from Last 3 Encounters:  08/03/20 88  07/23/20 89  07/14/20 97    Wt Readings from Last 3 Encounters:  08/03/20 181 lb (82.1 kg)  07/23/20 170 lb (77.1 kg)  07/14/20 172 lb 1.6 oz (78.1 kg)     LABS  reviewed today   Lab Results  Component Value Date   WBC 9.7 08/03/2020   HGB 12.6 (L) 08/03/2020   HCT 38.6 (L) 08/03/2020   MCV 89.4 08/03/2020   PLT 286 08/03/2020  Lab Results  Component Value Date   CREATININE 0.98 08/03/2020   BUN 10 08/03/2020   NA 139 08/03/2020   K 3.7 08/03/2020   CL 104 08/03/2020   CO2 28  08/03/2020   Lab Results  Component Value Date   ALT 18 08/03/2020   AST 13 (L) 08/03/2020   ALKPHOS 83 08/03/2020   BILITOT 0.9 08/03/2020   Lab Results  Component Value Date   CHOL 135 08/03/2020   HDL 38 (L) 08/03/2020   LDLCALC 63 08/03/2020   TRIG 170 (H) 08/03/2020   CHOLHDL 3.6 08/03/2020    Lab Results  Component Value Date   HGBA1C 5.7 (H) 07/08/2020   No results found for: TSH   STUDIES/PROCEDURES reviewed today   PostOp Echo 07/09/20 Left Ventricle: The left ventricle is unchanged from pre-bypass.  - Right Ventricle: The right ventricle appears unchanged from pre-bypass.  - Aorta: The aorta appears unchanged from pre-bypass.  - Left Atrium: The left atrium appears unchanged from pre-bypass.  - Left Atrial Appendage: The left atrial appendage appears unchanged from  pre-bypass.  - Aortic Valve: The aortic valve appears unchanged from pre-bypass.  - Mitral Valve: The mitral valve appears unchanged from pre-bypass.  - Tricuspid Valve: The tricuspid valve appears unchanged from pre-bypass.  - Interatrial Septum: The interatrial septum appears unchanged from  pre-bypass.  - Interventricular Septum: The interventricular septum appears unchanged  from  pre-bypass.  - Pericardium: The pericardium appears unchanged from pre-bypass.   Vas [place] PreCABG 07/09/20 Summary:  Right Carotid: The extracranial vessels were near-normal with only minimal  wall thickening or plaque.  Left Carotid: The extracranial vessels were near-normal with only minimal  wall        thickening or plaque.  Vertebrals: Bilateral vertebral arteries demonstrate antegrade flow.  Right ABI: Resting right ankle-brachial index is within normal range. No  evidence of significant right lower extremity arterial disease.  Left ABI: Resting left ankle-brachial index is within normal range. No  evidence of significant left lower extremity arterial disease.  Right Upper Extremity: Doppler  waveforms remain within normal limits with  right radial compression. Doppler waveforms decrease >50% with right ulnar  compression.  Left Upper Extremity: Doppler waveforms decrease >50% with left radial  compression. Doppler waveforms decrease >50% with left ulnar compression.   Echo 07/06/20 1. Left ventricular ejection fraction, by estimation, is 55 to 60%. The  left ventricle has normal function. The left ventricle has no regional  wall motion abnormalities. There is mild left ventricular hypertrophy.  Left ventricular diastolic parameters  were normal. The average left ventricular global longitudinal strain is  -15.2 %. The global longitudinal strain is abnormal.  2. Right ventricular systolic function is normal. The right ventricular  size is normal.  3. The mitral valve is normal in structure. No evidence of mitral valve  regurgitation. No evidence of mitral stenosis.  4. The aortic valve is normal in structure. Aortic valve regurgitation is  not visualized. No aortic stenosis is present.  5. The inferior vena cava is normal in size with greater than 50%  respiratory variability, suggesting right atrial pressure of 3 mmHg.   LHC 07/06/2020  The left ventricular systolic function is normal.  LV end diastolic pressure is normal.  The left ventricular ejection fraction is 55-65% by visual estimate.  Prox RCA lesion is 80% stenosed.  Dist RCA lesion is 30% stenosed.  RPDA lesion is 90% stenosed.  Prox LAD to Mid LAD lesion is 60% stenosed.  1st Diag lesion is 90% stenosed.  Lat 1st Diag lesion is 90% stenosed.  Mid Cx to Dist Cx lesion is 90% stenosed.  2nd Diag lesion is 80% stenosed.  2nd Mrg lesion is 90% stenosed. 1. Significant three-vessel coronary artery disease. The LAD disease appears moderate angiographically. However, it is diffusely diseased in the proximal mid segment and was highly significant by fractional flow reserve evaluation with an IFR  ratio of 0.74. In addition, there is severe stenosis in the bifurcating first diagonal, second diagonal and right coronary artery. The left circumflex is also significantly diseased in the mid to distal segment but OM branches are small and likely not graftable. 2. Normal LV systolic function and normal left ventricular end-diastolic pressure. Recommendations: I recommend transfer to [facility] for CABG evaluation. Resume heparin drip 6 hours after sheath pull.  Assessment & Plan   S/p CABG x3 (06/2020) Incison site infection, new History of CAD s/p remote self reported PCI --No CP.   Echo with EF 55 to 60%.  S/p CABG x3 2/21(LIMA to LAD, SVG to PDA, SVG -ramus).  Erythema and pain noted at previously inspected sites that was not reported at last visit. Also reports groin and armit pain and tenderness with suspicion for swollen lymph nodes.  Chronic reduced right radial pulse but otherwise 2+ pulses. No drainage noted by pt or on exam; however, given reported sx and new erythema, recommend short course Keflex x5d.  Case discussed with DOD today, given today's incision pain, volume overload, and his previous EKG and case (which was also reviewed with Dr. Mariah Milling at previous visit, given diffuse T wave inversion). EKG changes today less acute than previous and relatively asx with consideration of dyspnea due to volume overload. Continue close follow-up. He follows up with CTS next week with close follow-up recommended with [place] thereafter. As below, diuresis / Kcl restarted. Aggressive risk factor modification.  He has quit smoking and not restarted.  We reviewed ongoing precautions s/p CABG and lifestyle changes in detail today. Poor diet reported with prepackaged foods and heavy sweets and as in HPI. Heart healthy diet, fluid and salt restrictions reviewed in detail.  We will confirm that he is set up to complete cardiac rehab today. He does not remember his medications well, so suspect that he  may be at risk for noncompliance due to poor understanding of treatment plan and benefit from very close follow-up moving forward. At RTC, he will need repeat EKG.  He is aware that he should contact the office with any concerning symptoms, reviewed in detail today.   AOC HFpEF (55-60%) --No SOB/DOE reported but work of breathing noted and exam consistent with overload. Wt gain and BP elevated from previous. Previous Echo with EF 55 to 60%.  Intraoperative echo without changes from that of previous pre-CABG echo. BNP during admission 17 --> discharged on diuretic and potassium; however, only received 7 pills. Discharge instructions recommend ongoing diuresis. Lasix and potassium restarted today given work of breathing, volume up on exam, and wt gain / BP today. Updated labs show BNP 60s (though not impressive, this is still increased from 17). Renal function stable, so will continue with restarted diuresis. Reassess his volume status at CTS surgery and RTC. CHF education provided.  HTN, BP goal <130/80 --Current BP well controlled, though elevated from previous.  As above, restarting diuresis (with potassium), so will continue to defer change from Lopressor to Coreg, as this is GDMT and better targets DBP control. Low-salt  diet and fluid restriction reviewed today. He did get his BP cuff from social work and is monitoring at home with reported elevated home pressures at times with SBP 140-150s.   HLD, LDL goal <70 --06/2020 LDL 782. Recheck lipid and liver today. LDL goal below 70.  Recent previous tobacco use --Quit smoking after discharge.  He reports complete smoking cessation.  Congratulations provided.   Medication changes: Keflex x5 days per DOD, restart of discharge dose lasix and potassium Labs ordered: CBC, CMET, lipid, BNP Studies / Imaging ordered: None.  Reach out to social workers regarding compliance, cardiac rehab? He did get his BP cuff from social work and is monitoring it at  home. Future considerations: At RTC, if DBP still elevated, recommend transition from Lopressor to carvedilol for more optimal control of DBP.  Re inspect incision sites. EKG changes reviewed with DOD at prior visit with recommendation we keep a close eye on him moving forward.  Less acute today, which is reassuring. Would recommend screening for medication and treatment compliance frequently. Denies left side claudication-like symptoms but continue to reassess given previous recommendations per Dr. Kirke Corin.  Disposition: RTC within 1 month (CTS to see next week - will loop in)   Lennon Alstrom, PA-C

## 2020-08-03 NOTE — Addendum Note (Signed)
Addended by: Deloria Lair on: 08/03/2020 03:42 PM   Modules accepted: Orders

## 2020-08-03 NOTE — Patient Instructions (Addendum)
Medication Instructions:   Your physician has recommended you make the following change in your medication:   RESTART Furosemide 40mg  DAILY  RESTART Potassium DAILY  START Keflex 500mg  take one tablet FOUR times a day for 5 days  *If you need a refill on your cardiac medications before your next appointment, please call your pharmacy*   Lab Work:  Your physician recommends that you have lab work TODAY at the medical mall: Cmet, BNP, Lipid, CBC -  Please go to the [facility]. You will check in at the front desk to the right as you walk into the atrium. Valet Parking is offered if needed. - No appointment needed. You may go any day between 7 am and 6 pm.   Testing/Procedures:  None ordered   Follow-Up: At [facility], you and your health needs are our priority.  As part of our continuing mission to provide you with exceptional heart care, we have created designated Provider Care Teams.  These Care Teams include your primary Cardiologist (physician) and Advanced Practice Providers (APPs -  Physician Assistants and Nurse Practitioners) who all work together to provide you with the care you need, when you need it.  We recommend signing up for the patient portal called "MyChart".  Sign up information is provided on this After Visit Summary.  MyChart is used to connect with patients for Virtual Visits (Telemedicine).  Patients are able to view lab/test results, encounter notes, upcoming appointments, etc.  Non-urgent messages can be sent to your provider as well.   To learn more about what you can do with MyChart, go to [facility].    Your next appointment:   1 month(s)  The format for your next appointment:   In Person  Provider:   You may see [facility], MD or one of the following Advanced Practice Providers on your designated Care Team:    [email or web address], NP  Lorine Bears, PA-C  Nicolasa Ducking, PA-C  Cadence Eula Listen, Marisue Ivan  Fransico Michael,  NP   Please notify your cardiothoracic surgeon of your incision site at your appointment.

## 2020-08-07 ENCOUNTER — Telehealth: Payer: Self-pay | Admitting: *Deleted

## 2020-08-07 DIAGNOSIS — Z79899 Other long term (current) drug therapy: Secondary | ICD-10-CM

## 2020-08-07 DIAGNOSIS — I5032 Chronic diastolic (congestive) heart failure: Secondary | ICD-10-CM

## 2020-08-07 NOTE — Telephone Encounter (Signed)
-----   Message from Lennon Alstrom, PA-C sent at 08/06/2020 11:36 AM EDT ----- Please let Jeffery Sweeney know that his  --Fluid lab was elevated from his previous, but not outside of normal range. Potassium slightly below normal range. He should let us know if any dizziness or low blood pressure on his lasix.  Please ensure he stays hydrated as well. Repeat BMET 1 week. --Improved LDL or bad cholesterol. LDL now at goal. --His kidney function is stable.  His potassium is slightly below goal; however, we are starting him back on potassium supplementation with his Lasix.  As above, repeat BMET 1 week. --Blood counts stable.

## 2020-08-07 NOTE — Telephone Encounter (Signed)
Spoke to pt, notified of lab results and provider's recc.  Pt verbalized understanding. He will:  Ensure he is staying hydrated.  Call with any s/s dizziness or low BP.  Repeat Bmet at [facility] in 1 week.   Lab orders placed. Pt has no further questions at this time.

## 2020-08-08 ENCOUNTER — Other Ambulatory Visit: Payer: Self-pay | Admitting: Cardiothoracic Surgery

## 2020-08-08 DIAGNOSIS — Z951 Presence of aortocoronary bypass graft: Secondary | ICD-10-CM

## 2020-08-09 ENCOUNTER — Ambulatory Visit
Admission: RE | Admit: 2020-08-09 | Discharge: 2020-08-09 | Disposition: A | Discharge status: Home / Self Care | Payer: Self-pay | Source: Ambulatory Visit | Attending: Cardiothoracic Surgery | Admitting: Cardiothoracic Surgery

## 2020-08-09 ENCOUNTER — Encounter: Payer: Self-pay | Admitting: Cardiothoracic Surgery

## 2020-08-09 ENCOUNTER — Ambulatory Visit (INDEPENDENT_AMBULATORY_CARE_PROVIDER_SITE_OTHER): Payer: Self-pay | Admitting: Cardiothoracic Surgery

## 2020-08-09 ENCOUNTER — Other Ambulatory Visit: Payer: Self-pay

## 2020-08-09 VITALS — BP 119/71 | HR 77 | Temp 97.7°F | Resp 20 | Ht 63.0 in | Wt 177.5 lb

## 2020-08-09 DIAGNOSIS — Z951 Presence of aortocoronary bypass graft: Secondary | ICD-10-CM

## 2020-08-09 DIAGNOSIS — I2510 Atherosclerotic heart disease of native coronary artery without angina pectoris: Secondary | ICD-10-CM

## 2020-08-09 NOTE — Progress Notes (Signed)
[address]       [place] [address]             [id number]      LEAF KERNODLE [facility] Medical Record #[id number] Date of Birth: 1961-03-03  Referring: Iran Ouch, MD Primary Care: Patient, No Pcp Per Primary Cardiologist: Lorine Bears, MD   Chief Complaint:   POST OP FOLLOW UP Operative Report  DATE OF PROCEDURE: 07/09/2020 PREOPERATIVE DIAGNOSIS:  Unstable angina with 3-vessel coronary artery disease, status post previous stent placement. POSTOPERATIVE DIAGNOSES:  Unstable angina with 3-vessel coronary artery disease, status post previous stent placement. SURGICAL PROCEDURE:  Coronary artery bypass grafting x3 with left internal mammary to left anterior descending coronary artery, reverse saphenous vein graft to the intermediate coronary artery, reverse saphenous vein graft to the proximal posterior  descending coronary artery with right thigh, calf and endoscopic vein harvesting of the right greater saphenous vein.  History of Present Illness:      Patient returns to the office today approximately 1 month following urgent coronary artery bypass grafting when he presented with non-STEMI myocardial infarction.  This occurred while he was at work at [employer] in [place].  Reports history of myocardial infarction while living in [place] with PCI.  Since surgery he is been making reasonable progress postoperatively.  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   Past Medical History:  Diagnosis Date  . Hypertension   . Multiple vessel coronary artery disease   . Myocardial infarct (HCC)    x 3; PCI with single stent placement for each (in [place])  . NSTEMI (non-ST elevated myocardial infarction) [facility])    LHC 2/18 with multivessel CAD and transfer to [facility] for CABG eval  . Tobacco use    2  cigarettes daily     Social History   Tobacco Use  Smoking Status Former Smoker  . Packs/day: 0.50  . Types: Cigarettes  . Quit date: 07/06/2020  . Years since quitting: 0.0  Smokeless Tobacco Never Used    Social History   Substance and Sexual Activity  Alcohol Use Not Currently     No Known Allergies  Current Outpatient Medications  Medication Sig Dispense Refill  . aspirin EC 81 MG tablet Take 81 mg by mouth daily. Swallow whole.    Marland Kitchen atorvastatin (LIPITOR) 80 MG tablet Take 1 tablet (80 mg total) by mouth daily. 30 tablet 1  . furosemide (LASIX) 40 MG tablet Take 1 tablet (40 mg total) by mouth daily. 30 tablet 5  . ibuprofen (ADVIL) 200 MG tablet Take 200 mg by mouth every 6 (six) hours as needed.    . metoprolol tartrate (LOPRESSOR) 25 MG tablet Take 1 tablet (25 mg total) by mouth 2 (two) times daily. 60 tablet 1  . potassium chloride SA (KLOR-CON) 20 MEQ tablet Take 1 tablet (20 mEq total) by mouth daily. 30 tablet 5  . acetaminophen (TYLENOL) 325 MG tablet Take 2 tablets (650 mg total) by mouth every 6 (six) hours as needed for mild pain. (Patient not taking: Reported on 08/09/2020)     No current facility-administered medications for this visit.       Physical Exam: BP 119/71 (BP Location: Left Arm, Patient Position: Sitting, Cuff Size: Normal)   Pulse 77  Temp 97.7 F (36.5 C) (Skin)   Resp 20   Ht 5\' 3"  (1.6 m)   Wt 177 lb 8 oz (80.5 kg)   SpO2 97% Comment: RA  BMI 31.44 kg/m   General appearance: alert and cooperative Neurologic: intact Heart: regular rate and rhythm, S1, S2 normal, no murmur, click, rub or gallop Lungs: clear to auscultation bilaterally Abdomen: soft, non-tender; bowel sounds normal; no masses,  no organomegaly Extremities: extremities normal, atraumatic, no cyanosis or edema Wound: Sternum stable and well-healed, vein harvest sites also healing well he does have a small amount of eschar at the incision at the right knee but  without evidence of infection   Diagnostic Studies & Laboratory data:     Recent Radiology Findings:   DG Chest 2 View  Result Date: 08/09/2020 CLINICAL DATA:  Status post CABG. EXAM: CHEST - 2 VIEW COMPARISON:  07/13/2020. FINDINGS: Mediastinum and hilar structures normal. Prior CABG. Heart size normal. Mild left base subsegmental atelectasis and or scarring. No pleural effusion or pneumothorax. No acute bony abnormality. IMPRESSION: 1. Prior CABG. Heart size normal. 2. Mild left base subsegmental atelectasis and or scarring. Electronically Signed   By: 07/15/2020  Register   On: 08/09/2020 09:50    I have independently reviewed the above radiology studies  and reviewed the findings with the patient.    Recent Lab Findings: Lab Results  Component Value Date   WBC 9.7 08/03/2020   HGB 12.6 (L) 08/03/2020   HCT 38.6 (L) 08/03/2020   PLT 286 08/03/2020   GLUCOSE 105 (H) 08/03/2020   CHOL 135 08/03/2020   TRIG 170 (H) 08/03/2020   HDL 38 (L) 08/03/2020   LDLCALC 63 08/03/2020   ALT 18 08/03/2020   AST 13 (L) 08/03/2020   NA 139 08/03/2020   K 3.7 08/03/2020   CL 104 08/03/2020   CREATININE 0.98 08/03/2020   BUN 10 08/03/2020   CO2 28 08/03/2020   INR 1.3 (H) 07/09/2020   HGBA1C 5.7 (H) 07/08/2020      Assessment / Plan:   #1 status post coronary artery bypass grafting x3 originally presenting with non-STEMI myocardial infarction-no evidence of incision site infection is present in spite of the patient being administered several doses of Keflex cardiology #2 hypertension #3 hyperlipidemia *   Patient has been continued on Lasix and potassium by cardiology-does have difficulty swallowing the very large potassium 20 mEq tablets  I have given him a release to return to light duty work no lifting over 20 to 25 pounds for 3 months postop  We will continue to be followed by cardiology-have not made any current appointment for the surgical office   Medication Changes: No orders  of the defined types were placed in this encounter.     07/10/2020 MD      [address] [place] [address] [place] Office [phone]     08/09/2020 9:59 AM

## 2020-08-15 ENCOUNTER — Other Ambulatory Visit: Payer: Self-pay | Admitting: *Deleted

## 2020-08-15 ENCOUNTER — Other Ambulatory Visit: Payer: Self-pay | Admitting: Cardiovascular Disease

## 2020-08-15 MED ORDER — METOPROLOL TARTRATE 25 MG PO TABS: 25.0000 mg | ORAL_TABLET | Freq: Two times a day (BID) | ORAL | 3 refills | Status: DC

## 2020-08-15 MED ORDER — ATORVASTATIN CALCIUM 80 MG PO TABS: 80.0000 mg | ORAL_TABLET | Freq: Every day | ORAL | 3 refills | Status: DC

## 2020-08-15 NOTE — Telephone Encounter (Signed)
*  STAT* If patient is at the pharmacy, call can be transferred to refill team.   1. Which medications need to be refilled? (please list name of each medication and dose if known)  Metoprolol tartrate 25 MG 1 tablet 2 times daily  Atorvastatin 80 MG 1 tablet daily   2. Which pharmacy/location (including street and city if local pharmacy) is medication to be sent to? [facility] on [address]   3. Do they need a 30 day or 90 day supply? 90 day

## 2020-08-21 ENCOUNTER — Telehealth: Payer: Self-pay | Admitting: Cardiovascular Disease

## 2020-08-21 NOTE — Telephone Encounter (Addendum)
Left voicemail message to call back to review questions. Form completed and given to Dr. Jari Sportsman nurse.

## 2020-08-21 NOTE — Telephone Encounter (Signed)
Patient calling  States that he would like to discuss getting a handicap sticker  At work he has to park far away Also is having trouble sleeping  Please call to discuss

## 2020-08-23 NOTE — Telephone Encounter (Signed)
Spoke with patient and he wanted to know the status of his handicap placard. Advised that it has been done and just pending Dr. Kirke Corin to sign. He states that he had one elevated blood pressure of 150/101. Instructed him to monitor blood pressures, keep a log, and bring to his upcoming appointment. He also spoke about the work he does and how draining and difficult it can be at times. Requested that he please write down these questions to bring to his upcoming appointment. Reviewed that when his form has been signed by provider then we will call him to come pick up. He verbalized understanding of our conversation, instructions for monitoring, and confirmed upcoming appointment. He had no further questions at this time.

## 2020-08-24 NOTE — Telephone Encounter (Signed)
Patient calling to check on status of signature  Told patient we will call when ready

## 2020-08-27 NOTE — Telephone Encounter (Signed)
Handicap place card form has been signed by Dr. Kirke Corin and left at the front desk to be picked up. Patient made aware and voiced appreciation for the assistance.

## 2020-09-07 ENCOUNTER — Ambulatory Visit: Payer: Self-pay | Admitting: Physician Assistant

## 2020-09-18 ENCOUNTER — Ambulatory Visit: Payer: Self-pay | Admitting: Physician Assistant

## 2020-09-20 ENCOUNTER — Ambulatory Visit (INDEPENDENT_AMBULATORY_CARE_PROVIDER_SITE_OTHER): Payer: Self-pay | Admitting: Physician Assistant

## 2020-09-20 ENCOUNTER — Encounter: Payer: Self-pay | Admitting: Physician Assistant

## 2020-09-20 ENCOUNTER — Other Ambulatory Visit: Payer: Self-pay

## 2020-09-20 VITALS — BP 134/84 | HR 87 | Ht 63.0 in | Wt 183.0 lb

## 2020-09-20 DIAGNOSIS — I5032 Chronic diastolic (congestive) heart failure: Secondary | ICD-10-CM

## 2020-09-20 DIAGNOSIS — E785 Hyperlipidemia, unspecified: Secondary | ICD-10-CM

## 2020-09-20 DIAGNOSIS — Z87891 Personal history of nicotine dependence: Secondary | ICD-10-CM

## 2020-09-20 DIAGNOSIS — I10 Essential (primary) hypertension: Secondary | ICD-10-CM

## 2020-09-20 DIAGNOSIS — Z951 Presence of aortocoronary bypass graft: Secondary | ICD-10-CM

## 2020-09-20 DIAGNOSIS — I252 Old myocardial infarction: Secondary | ICD-10-CM

## 2020-09-20 DIAGNOSIS — I2510 Atherosclerotic heart disease of native coronary artery without angina pectoris: Secondary | ICD-10-CM

## 2020-09-20 DIAGNOSIS — K219 Gastro-esophageal reflux disease without esophagitis: Secondary | ICD-10-CM

## 2020-09-20 DIAGNOSIS — Z79899 Other long term (current) drug therapy: Secondary | ICD-10-CM

## 2020-09-20 MED ORDER — PANTOPRAZOLE SODIUM 40 MG PO TBEC: 40.0000 mg | DELAYED_RELEASE_TABLET | Freq: Every day | ORAL | 6 refills | Status: DC

## 2020-09-20 MED ORDER — METOPROLOL TARTRATE 25 MG PO TABS: 37.5000 mg | ORAL_TABLET | Freq: Two times a day (BID) | ORAL | 6 refills | Status: DC

## 2020-09-20 MED ORDER — TORSEMIDE 20 MG PO TABS: 20.0000 mg | ORAL_TABLET | Freq: Two times a day (BID) | ORAL | 6 refills | Status: DC

## 2020-09-20 NOTE — Patient Instructions (Signed)
Medication Instructions:  - Your physician has recommended you make the following change in your medication:   1) STOP lasix (furosemide)  2) START demadex (torsemide) 20 mg- take 1 tablet by mouth ONCE daily  3) START protonix 40 mg- take 1 tablet by mouth ONCE daily   4) INCREASE metoprolol tartrate 25 mg- take 1.5 tablets (37.5 mg) by mouth TWICE daily  *If you need a refill on your cardiac medications before your next appointment, please call your pharmacy*   Lab Work: - none ordered  If you have labs (blood work) drawn today and your tests are completely normal, you will receive your results only by: Marland Kitchen MyChart Message (if you have MyChart) OR . A paper copy in the mail If you have any lab test that is abnormal or we need to change your treatment, we will call you to review the results.   Testing/Procedures: - none ordered   Follow-Up: At [facility], you and your health needs are our priority.  As part of our continuing mission to provide you with exceptional heart care, we have created designated Provider Care Teams.  These Care Teams include your primary Cardiologist (physician) and Advanced Practice Providers (APPs -  Physician Assistants and Nurse Practitioners) who all work together to provide you with the care you need, when you need it.  We recommend signing up for the patient portal called "MyChart".  Sign up information is provided on this After Visit Summary.  MyChart is used to connect with patients for Virtual Visits (Telemedicine).  Patients are able to view lab/test results, encounter notes, upcoming appointments, etc.  Non-urgent messages can be sent to your provider as well.   To learn more about what you can do with MyChart, go to [email or web address].    Your next appointment:   2 week(s)  The format for your next appointment:   In Person  Provider:   You may see Lorine Bears, MD or one of the following Advanced Practice Providers on your  designated Care Team:    Nicolasa Ducking, NP  Eula Listen, PA-C  Marisue Ivan, PA-C  Cadence Brandon, [place]  Gillian Shields, NP    Other Instructions n/a

## 2020-09-20 NOTE — Progress Notes (Signed)
Office Visit    Patient Name: Jeffery Sweeney Date of Encounter: 09/20/2020  PCP:  Patient, No Pcp Per (Inactive)   [facility]  Cardiologist:  Lorine Bears, MD  Advanced Practice Provider:  No care team member to display Electrophysiologist:  None   :[id number]}   Chief Complaint    Chief Complaint  Patient presents with  . Follow-up    1 Month follow up. Medications verbally reviewed with patient.     33 to male with a hx of self-reported MI x3 (~5 yr prior) with subsequent PCI and CABGx3, HTN, recent tobacco use at approximately 2 cigarettes daily (quit after CABG), HLD, and seen today for 1 month repeat follow-up after NSTEMI with LHC and transfer to [facility] for CABGx3.  Past Medical History    Past Medical History:  Diagnosis Date  . Hypertension   . Multiple vessel coronary artery disease   . Myocardial infarct (HCC)    x 3; PCI with single stent placement for each (in [place])  . NSTEMI (non-ST elevated myocardial infarction) [facility]    LHC 2/18 with multivessel CAD and transfer to [facility] for CABG eval  . Tobacco use    2 cigarettes daily   Past Surgical History:  Procedure Laterality Date  . CORONARY ANGIOPLASTY WITH STENT PLACEMENT     in [place] in 2018-19  . CORONARY ARTERY BYPASS GRAFT N/A 07/09/2020   Procedure: CORONARY ARTERY BYPASS GRAFTING (CABG) x THREE , USING LEFT INTERNAL MAMMARY ARTERY, AND RIGHT LEG GREATER SAPHENOUS VEIN HARVESTED ENDOSCOPICALLY;  Surgeon: Delight Ovens, MD;  Location: [facility] OR;  Service: Open Heart Surgery;  Laterality: N/A;  . INTRAVASCULAR PRESSURE WIRE/FFR STUDY N/A 07/06/2020   Procedure: INTRAVASCULAR PRESSURE WIRE/FFR STUDY;  Surgeon: Iran Ouch, MD;  Location: [facility] INVASIVE CV LAB;  Service: Cardiovascular;  Laterality: N/A;  LAD  . LEFT HEART CATH AND CORONARY ANGIOGRAPHY N/A 07/06/2020   Procedure: LEFT HEART CATH AND CORONARY ANGIOGRAPHY;  Surgeon: Iran Ouch, MD;  Location: [facility]  INVASIVE CV LAB;  Service: Cardiovascular;  Laterality: N/A;  . TEE WITHOUT CARDIOVERSION N/A 07/09/2020   Procedure: TRANSESOPHAGEAL ECHOCARDIOGRAM (TEE);  Surgeon: Delight Ovens, MD;  Location: [facility] OR;  Service: Open Heart Surgery;  Laterality: N/A;  . WRIST SURGERY Left    for cyst    Allergies  No Known Allergies  History of Present Illness    Jeffery Sweeney is a 60 y.o. male with PMH as above.   He reports a previous history of MI x3with PCIin [place] ~5 years ago.He is a current smoker2 cigarettes daily and reports smoking for at least 30-40years.He works in a kitchen and stays busy but does not have a regular workout routine. He [place] cardiologist within [facility] after moving to [place] in 2019. He denies taking any of his cardiac medications.  On 07/04/2020, he woke with CP and SOB, similar to that before his previous MIs. CP was described as a pressure or "bear-hug" that only alleviated when breathing out very slowly. He has since continued to have episodes of CP.  He reported one episode of worsening chest pain while putting away dishes since that time.  His worst or longest episode occurred last night (07/05/2020), lasting hours.He tried Tums and antiacids with no relief. He decided to call 911. No associated nausea or emesis. No signs or symptoms of bleeding. No abdominal distention or lower extremity edema.  EMS arrived and administered nitro and ASA 324 mg  in route. He reported initial improvement of his CP with start of heparin and nitro but then had recurrent symptoms/CP. In the ED at [facility], vitals significant for BP 158/121, HR 101 bpm.Labs significant for glucose 113, high-sensitivity troponin [id number],renal function stable atCr0.95, BUN 17, H&H stable.EKG showed sinus tachycardia, 105 bpm,slightdepression noted in lateralI, V5, V6 andanteriorV2-V4 and not meeting criteria.Chest x-ray without active disease.   At the time of  cardiology consultation 2/18, he reported ongoing CP and chest soreness.  He also reported left leg pain with walking, starting at the hip with diminished left femoral pulse on exam. Same day 2/18 LHC performed as copied and pasted below and showed significant three-vessel CAD.  LAD disease appeared moderate by angiography.  It was diffusely diseased in the proximal mid segment and highly significant by FFR with IFR ratio of 0.74.  Also noted was severe stenosis in the bifurcating first diagonal, second diagonal, and RCA.  Left circumflex was also significantly diseased in the mid to distal segment.  OM branches were noted to be small and likely not graftable.  LVSF and LVEDP normal.  Subsequent echo showed EF 55 to 60%, NR WMA, mild LVH, GLS -15.2%.  Recommendation was for transfer to [facility] for CABG evaluation. He was transferred to [facility] and underwent CABG x3 (LIMA-LAD, rSVG-PDA, SVG-ramus) on 2/21 with Dr. Tyrone Sage.  Pre-CABG carotid ultrasound showed bilateral vessels near normal with only minimal thickening or plaque.  Right and left ABI within normal range without significant evidence of arterial disease.  Outpatient vascular evaluation for L leg claudication was recommended, as well as smoking cessation.  Seen in clinic 07/23/2020, at which time he reported left-sided sternal incision numbness/numbness along the left side of his chest.  He also reported soreness of the right side of the sternal incision and along the incision site.  Ongoing right radial reduced pulse due to previous surgery of his right hand and wrist, as well as recent radial artery cath was noted.  We perfused to the right hand well.  He had right lower extremity swelling with consideration of right SVG harvesting.  He had left leg soreness, especially on the side of his leg when walking.  ABIs performed pre-CABG and with normal left side ABI, though reduced left femoral pulse was noted during his catheterization on 2/18.  He denied  any groin pain or swelling.  He reported that he had been very active since his bypass surgery.  He was doing stairs and walking around the house without a walker.  Of note, given no post CABG EKG, EKG inspected with DOD, Dr. Mariah Milling, same day given diffuse T wave inversion. Recommendation was for close follow-up by DOD that day.  Seen 3/18 without exertional chest pain or chest pain at rest.  He was noted to be volume overloaded with weight gain and physical exam notable for lower extremity edema, abdominal distention, weight gain, and work of breathing.  He i was s not eating very well with lots of ice cream and boxes / prepackaged foods/high salt diet. He was not sleeping well, waking q3h.  He reported recent stressors with an uncle that recently had a stroke. He had difficulty swallowing most big pills, but agreeable to potassium supplementation with both potassium and Lasix restart due to volume overload on exam.  He had received his BP cuff via social work. He had not started cardiac rehab. No workout routine.  He quit smoking. Pain and TTP in his right underarm and R groin  noted, as well as down his R leg.  Erythema also noted. Keflex prescribed. All R sided upper and lower pulses still intact, and with consideration RUE as always 1+ as above.   Seen by Dr. Tyrone Sage 08/09/20 and volume status improved.   Today, 09/20/20, he returns to clinic with increasing strength and has returned to work at [employer]. He notes ongoing chest numbness to the left of his chest incision site but tenderness resolved since completion of Keflex. He denies any further sx consistent with infection. His volume status is significantly improved since restarting Lasix and potassium at his previous visit.  He denies further work of breathing, dyspnea, lower extremity edema, or abdominal distention.  He does report that he now has an inhaler, which helps his breathing. No CP or SOB at rest.   He has returned to  work since his last visit.  He reports light duty with his job at [employer].  When walking too much with this job, he reports fatigue, so he is trying to take it easy.  He also works a job at [employer], and he reports fatigue when lifting boxes, though he does state the boxes are under 20-25 pounds with light duty recommendations reviewed once more per CTS.  No racing heart rate or palpitations.  No presyncope or syncope.  No signs or symptoms of bleeding.  He reports eating 2 slices of pizza yesterday and skipping his Lasix over the last day, which he states is the reason for his elevated blood pressure and slight elevation of volume status today.  He states that he has recently had right sided pain/twinges in his abdomen, felt when skipping his fluid pill and then when trying to sit up from bed.  It is not exertional pain, and it is usually associated with abdominal distention and bloating with high salt foods and skipping his lasix. He reports sx of GERD recently that resolved with taking a friend's PPI, which he thinks was Protonix. Again, no s/sx of bleeding.   He states he takes his fluid pill at night right now, causing him to stay up all night urinating, which is the reason he sometimes skips the pill.  We discussed the recommendation to start taking his fluid pill during the morning.  Low-salt and fluid diet reviewed. Home pressures reviewed with SBP 118-146 and DBP 80-90, improved from previous along with improved volume status as above.   Home Medications    Current Outpatient Medications on File Prior to Visit  Medication Sig Dispense Refill  . aspirin EC 81 MG tablet Take 81 mg by mouth daily. Swallow whole.    Marland Kitchen atorvastatin (LIPITOR) 80 MG tablet Take 1 tablet (80 mg total) by mouth daily. 90 tablet 3  . furosemide (LASIX) 40 MG tablet Take 1 tablet (40 mg total) by mouth daily. 30 tablet 5  . ibuprofen (ADVIL) 200 MG tablet Take 200 mg by mouth every 6 (six) hours as needed.    . metoprolol  tartrate (LOPRESSOR) 25 MG tablet Take 1 tablet (25 mg total) by mouth 2 (two) times daily. 180 tablet 3  . potassium chloride SA (KLOR-CON) 20 MEQ tablet Take 1 tablet (20 mEq total) by mouth daily. 30 tablet 5   No current facility-administered medications on file prior to visit.    Review of Systems    He reports GERD, abdominal distention and cramping when skipping his fluid pill. He reports left-sided sternal chest numbness.  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 He perfuses well to his R wrist on inspection /eval today.  He denies chest pain, palpitations, dyspnea, pnd, orthopnea, v, dizziness, syncope, edema, weight gain, or early satiety. He reports resolution of both his previous infection (treated with Keflex) and volume status (treated with lasix). All other systems reviewed and are otherwise negative except as noted above.  Physical Exam    VS:  BP 134/84 (BP Location: Left Arm, Patient Position: Sitting, Cuff Size: Normal)   Pulse 87   Ht 5\' 3"  (1.6 m)   Wt 183 lb (83 kg)   SpO2 98%   BMI 32.42 kg/m  , BMI Body mass index is 32.42 kg/m. GEN: Well nourished, well developed, in no acute distress.  HEENT: normal. Neck: Supple, no JVD, carotid bruits, or masses. Cardiac: RRR, no murmurs, rubs, or gallops. No clubbing, cyanosis. Reduced 1+ R pulse which is a chronic finding due to previous surgery on R hand and wrist. RUE perfusing adequately despite reduced pulse.  He also reports chronic nerve issues with the right wrist.  L radial pulse 2+ Respiratory: CTAB. GI: Slightly firm and distended, BS + x 4. MS: no deformity or atrophy. Skin: warm and dry, no rash. Neuro:  Strength and sensation are intact. Psych: Normal affect.  Accessory Clinical Findings    ECG personally reviewed by me today - NSR, 87bpm, improved TWI from previous  EKGs  VITALS Reviewed today   Temp Readings from Last 3 Encounters:  08/09/20 97.7 F (36.5 C) (Skin)  07/14/20 98.2 F (36.8 C) (Oral)  07/06/20 98.1 F (36.7 C) (Oral)   BP Readings from Last 3 Encounters:  09/20/20 134/84  08/09/20 119/71  08/03/20 120/80   Pulse Readings from Last 3 Encounters:  09/20/20 87  08/09/20 77  08/03/20 88    Wt Readings from Last 3 Encounters:  09/20/20 183 lb (83 kg)  08/09/20 177 lb 8 oz (80.5 kg)  08/03/20 181 lb (82.1 kg)     LABS  reviewed today   Lab Results  Component Value Date   WBC 9.7 08/03/2020   HGB 12.6 (L) 08/03/2020   HCT 38.6 (L) 08/03/2020   MCV 89.4 08/03/2020   PLT 286 08/03/2020   Lab Results  Component Value Date   CREATININE 0.98 08/03/2020   BUN 10 08/03/2020   NA 139 08/03/2020   K 3.7 08/03/2020   CL 104 08/03/2020   CO2 28 08/03/2020   Lab Results  Component Value Date   ALT 18 08/03/2020   AST 13 (L) 08/03/2020   ALKPHOS 83 08/03/2020   BILITOT 0.9 08/03/2020   Lab Results  Component Value Date   CHOL 135 08/03/2020   HDL 38 (L) 08/03/2020   LDLCALC 63 08/03/2020   TRIG 170 (H) 08/03/2020   CHOLHDL 3.6 08/03/2020    Lab Results  Component Value Date   HGBA1C 5.7 (H) 07/08/2020   No results found for: TSH   STUDIES/PROCEDURES reviewed today   PostOp Echo 07/09/20 Left Ventricle: The left ventricle is unchanged from pre-bypass.  - Right Ventricle: The right ventricle appears unchanged from pre-bypass.  - Aorta: The aorta appears unchanged from pre-bypass.  - Left Atrium: The left atrium appears unchanged from pre-bypass.  - Left Atrial Appendage: The left atrial appendage appears unchanged from  pre-bypass.  - Aortic Valve: The aortic valve appears unchanged from pre-bypass.  - Mitral  Valve: The mitral valve appears unchanged from pre-bypass.  - Tricuspid Valve: The tricuspid valve appears unchanged from pre-bypass.  - Interatrial Septum: The interatrial septum appears unchanged  from  pre-bypass.  - Interventricular Septum: The interventricular septum appears unchanged  from  pre-bypass.  - Pericardium: The pericardium appears unchanged from pre-bypass.   Vas [place] PreCABG 07/09/20 Summary:  Right Carotid: The extracranial vessels were near-normal with only minimal  wall thickening or plaque.  Left Carotid: The extracranial vessels were near-normal with only minimal  wall        thickening or plaque.  Vertebrals: Bilateral vertebral arteries demonstrate antegrade flow.  Right ABI: Resting right ankle-brachial index is within normal range. No  evidence of significant right lower extremity arterial disease.  Left ABI: Resting left ankle-brachial index is within normal range. No  evidence of significant left lower extremity arterial disease.  Right Upper Extremity: Doppler waveforms remain within normal limits with  right radial compression. Doppler waveforms decrease >50% with right ulnar  compression.  Left Upper Extremity: Doppler waveforms decrease >50% with left radial  compression. Doppler waveforms decrease >50% with left ulnar compression.   Echo 07/06/20 1. Left ventricular ejection fraction, by estimation, is 55 to 60%. The  left ventricle has normal function. The left ventricle has no regional  wall motion abnormalities. There is mild left ventricular hypertrophy.  Left ventricular diastolic parameters  were normal. The average left ventricular global longitudinal strain is  -15.2 %. The global longitudinal strain is abnormal.  2. Right ventricular systolic function is normal. The right ventricular  size is normal.  3. The mitral valve is normal in structure. No evidence of mitral valve  regurgitation. No evidence of mitral stenosis.  4. The aortic valve is normal in structure. Aortic valve regurgitation is  not visualized. No aortic stenosis is present.  5. The inferior vena cava is normal in size with greater than 50%  respiratory  variability, suggesting right atrial pressure of 3 mmHg.   LHC 07/06/2020  The left ventricular systolic function is normal.  LV end diastolic pressure is normal.  The left ventricular ejection fraction is 55-65% by visual estimate.  Prox RCA lesion is 80% stenosed.  Dist RCA lesion is 30% stenosed.  RPDA lesion is 90% stenosed.  Prox LAD to Mid LAD lesion is 60% stenosed.  1st Diag lesion is 90% stenosed.  Lat 1st Diag lesion is 90% stenosed.  Mid Cx to Dist Cx lesion is 90% stenosed.  2nd Diag lesion is 80% stenosed.  2nd Mrg lesion is 90% stenosed. 1. Significant three-vessel coronary artery disease. The LAD disease appears moderate angiographically. However, it is diffusely diseased in the proximal mid segment and was highly significant by fractional flow reserve evaluation with an IFR ratio of 0.74. In addition, there is severe stenosis in the bifurcating first diagonal, second diagonal and right coronary artery. The left circumflex is also significantly diseased in the mid to distal segment but OM branches are small and likely not graftable. 2. Normal LV systolic function and normal left ventricular end-diastolic pressure. Recommendations: I recommend transfer to [facility] for CABG evaluation. Resume heparin drip 6 hours after sheath pull.  Assessment & Plan   S/p CABG x3 (06/2020) History of CAD s/p remote self reported PCI --No CP, though sx of GERD as reported below and started on PPI.  Echo with EF 55 to 60%.  S/p CABG x3 2/21(LIMA to LAD, SVG to PDA, SVG -ramus).  Previous incision site infection  resolved with Keflex x5d.  Continue medical management with aggressive risk factor modification.  We reviewed ongoing wt lifting precautions s/p CABG / lifestyle changes in detail today. Ongoing smoking cessation.  Increase activity as tolerated. As below, lopressor increased to 37.5mg  BID for additional HR and BP control, as well as antianginal  effect.  HFpEF (55-60%) --Improved SOB/DOE and volume status improved since restart of diuresis, though still abdominal distention on exam with consideration of missed lasix dose over the last 24h. Wt and BP also elevated from previous clinic visit (again, with consideration of missed fluid pill dose).  Previous Echo with EF 55 to 60%. Recommended he take his fluid pill in the AM to prevent urination interrupting his sleep and thus increase compliance with diuresis. Given abdominal distention, lasix changed to torsemide  daily with KCl supplementation, as this may better target his fluid. HR and BP elevated in clinic and at home with recommendation to increase BB to lopressor 37.5mg  BID as above. Consider change from lopressor to Coreg at RTC depending on vitals at that time. Will defer for now. Reassess HR, BP, and volume status again at RTC.  HTN, BP goal <130/80 --Current BP suboptimal. He did skip his fluid pill the last day with abdominal distention and wt gain also noted. Given his HPI above, will change lasix  daily to torsemide  daily (to be taken in AM). Given need for HR / BP control, will also increase to lopressor 37.5mg  BID. At RTC, change lopressor to Coreg if DBP still elevated, as this is GDMT and better targets DBP control. Low-salt diet and fluid restriction reviewed today. He did get his BP cuff from social work and is monitoring at home with reported elevated home pressures at times with SBP 120-140s, improved from previous home readings.   HLD, LDL goal <70 --LDL 63, well controlled. Continue statin.   Recent previous tobacco use -Quit smoking after discharge.  He reports complete smoking cessation.  Congratulations provided.  GERD sx --Reports recent GERD sx. No s/sx of bleeding. Start Protonix  daily.    Medication changes: Protonix  qd, lasix  daily discontinued, started torsemide  daily started, lopressor increased to 37.5mg  BID Labs ordered:  None Studies / Imaging ordered: None Future considerations: At RTC, if DBP still elevated, recommend transition from Lopressor to carvedilol for more optimal control of DBP.   Disposition: RTC within 2 weeks to reassess given med changes above   Lennon Alstrom, PA-C

## 2020-09-21 ENCOUNTER — Encounter: Payer: Self-pay | Admitting: Physician Assistant

## 2020-09-26 ENCOUNTER — Other Ambulatory Visit: Payer: Self-pay

## 2020-09-26 MED ORDER — ATORVASTATIN CALCIUM 80 MG PO TABS: 80.0000 mg | ORAL_TABLET | Freq: Every day | ORAL | 0 refills | Status: DC

## 2020-09-26 NOTE — Telephone Encounter (Signed)
*  STAT* If patient is at the pharmacy, call can be transferred to refill team.   1. Which medications need to be refilled? (please list name of each medication and dose if known) Lipitor  2. Which pharmacy/location (including street and city if local pharmacy) is medication to be sent to? [facility] [address]  3. Do they need a 30 day or 90 day supply? 30

## 2020-10-02 ENCOUNTER — Telehealth: Payer: Self-pay | Admitting: Physician Assistant

## 2020-10-02 NOTE — Telephone Encounter (Signed)
Spoke with patient and he reports episode on Sunday after work where he had some chest pain in the center of his chest. Yesterday evening he was very tired but could not sleep good. He worked 14 hours Saturday and then 9 hours on Sunday. Inquired if he had lifted anything heavy. Then inquired what he does at his job. 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 He does not have prescription for nitroglycerin (not sure if he would understand instructions for this). He states his torsemide bottle has instructions to take 20 mg twice a day. Reviewed that at his last visit they changed his instructions to take only once daily. He seems confused about the different instructions from the bottle and that at his last visit. Reiterated the way he should be taking Torsemide 20 mg once daily. Then he wanted to clarify Pantoprazole 40 mg once daily, Metoprolol 25 mg taking one and one half tablet (37.5 mg) twice a day. Requested he bring in all of his medication bottles to upcoming appointment this Friday so we can help him make sure that he is taking them correctly and fix any errors that may be in our system. His blood pressure today was 127/69. He reports that the pain happened at the end of his work day. Recommended that if pain returns to try ibuprofen and if that does not help then he needs to proceed to local emergency room. Reviewed signs and symptoms to monitor that would also require him to seek care in ER. He has upcoming appointment this Friday at 3 pm (we changed time due to him working).   Sending to APP here in office for review.   Of note he had: Cath 2/18  [facility] CABG x 3 on 2/21

## 2020-10-02 NOTE — Telephone Encounter (Signed)
Patient states he had an episode on Sunday night and had chest pain  Pt c/o of Chest Pain: STAT if CP now or developed within 24 hours  1. Are you having CP right now? no  2. Are you experiencing any other symptoms (ex. SOB, nausea, vomiting, sweating)? headache  3. How long have you been experiencing CP? Only on Sunday night, denies any chest pain yesterday, just "tired" yesterday  4. Is your CP continuous or coming and going? Comes and goes, patient laid down and eventually the pain subsided.  5. Have you taken Nitroglycerin? no ?

## 2020-10-02 NOTE — Telephone Encounter (Signed)
Anticipate chest pain is musculoskeletal. Agree with recommendations for continued monitoring, ibuprofen for recurrence, and ED visit if persistent chest pain. Agree with recommendation to bring pill bottles to next appointment - we may want to consider switching him to a mailing service such as [facility] or a pill pack at follow up for easier medication adherence.   As he just had CABG 06/2020 and his symptoms are likely musculoskeletal I have low suspicion for recurrent angina. Given medication confusion, would not Rx nitroglycerin at this time.   Follow up as scheduled.  Alver Sorrow, NP

## 2020-10-05 ENCOUNTER — Ambulatory Visit: Payer: Self-pay | Admitting: Physician Assistant

## 2020-10-05 ENCOUNTER — Other Ambulatory Visit: Payer: Self-pay

## 2020-10-05 ENCOUNTER — Encounter: Payer: Self-pay | Admitting: Physician Assistant

## 2020-10-05 ENCOUNTER — Ambulatory Visit (INDEPENDENT_AMBULATORY_CARE_PROVIDER_SITE_OTHER): Payer: Self-pay | Admitting: Physician Assistant

## 2020-10-05 ENCOUNTER — Telehealth: Payer: Self-pay | Admitting: *Deleted

## 2020-10-05 ENCOUNTER — Other Ambulatory Visit
Admission: RE | Admit: 2020-10-05 | Discharge: 2020-10-05 | Disposition: A | Discharge status: Home / Self Care | Payer: Self-pay | Attending: Physician Assistant | Admitting: Physician Assistant

## 2020-10-05 VITALS — BP 118/80 | HR 88 | Ht 63.0 in | Wt 184.0 lb

## 2020-10-05 DIAGNOSIS — I5032 Chronic diastolic (congestive) heart failure: Secondary | ICD-10-CM

## 2020-10-05 DIAGNOSIS — Z79899 Other long term (current) drug therapy: Secondary | ICD-10-CM

## 2020-10-05 DIAGNOSIS — K219 Gastro-esophageal reflux disease without esophagitis: Secondary | ICD-10-CM

## 2020-10-05 DIAGNOSIS — I2510 Atherosclerotic heart disease of native coronary artery without angina pectoris: Secondary | ICD-10-CM

## 2020-10-05 DIAGNOSIS — R079 Chest pain, unspecified: Secondary | ICD-10-CM

## 2020-10-05 DIAGNOSIS — E876 Hypokalemia: Secondary | ICD-10-CM

## 2020-10-05 DIAGNOSIS — I10 Essential (primary) hypertension: Secondary | ICD-10-CM | POA: Insufficient documentation

## 2020-10-05 DIAGNOSIS — Z87891 Personal history of nicotine dependence: Secondary | ICD-10-CM

## 2020-10-05 DIAGNOSIS — Z951 Presence of aortocoronary bypass graft: Secondary | ICD-10-CM

## 2020-10-05 DIAGNOSIS — E785 Hyperlipidemia, unspecified: Secondary | ICD-10-CM

## 2020-10-05 LAB — BASIC METABOLIC PANEL
Anion gap: 10 (ref 5–15)
BUN: 14 mg/dL (ref 6–20)
CO2: 26 mmol/L (ref 22–32)
Calcium: 8.8 mg/dL — ABNORMAL LOW (ref 8.9–10.3)
Chloride: 102 mmol/L (ref 98–111)
Creatinine, Ser: 1.02 mg/dL (ref 0.61–1.24)
GFR, Estimated: >60 mL/min (ref >60)
Glucose, Bld: 97 mg/dL (ref 70–99)
Potassium: 3.3 mmol/L — ABNORMAL LOW (ref 3.5–5.1)
Sodium: 138 mmol/L (ref 135–145)

## 2020-10-05 LAB — MAGNESIUM: Magnesium: 1.9 mg/dL (ref 1.7–2.4)

## 2020-10-05 MED ORDER — NITROGLYCERIN 0.4 MG SL SUBL: 0.4000 mg | SUBLINGUAL_TABLET | SUBLINGUAL | 3 refills | Status: DC | PRN

## 2020-10-05 MED ORDER — TORSEMIDE 20 MG PO TABS: 20.0000 mg | ORAL_TABLET | Freq: Every day | ORAL | 5 refills | Status: DC

## 2020-10-05 NOTE — Telephone Encounter (Signed)
Attempted to call pt, no answer. Lmtcb.  

## 2020-10-05 NOTE — Telephone Encounter (Signed)
-----   Message from Lennon Alstrom, PA-C sent at 10/05/2020  4:57 PM EDT ----- Labs obtained after taking double torsemide and while on same (not doubled) dose of K+  --Kidney function bumped --Potassium dropped and now is low at 3.3  Recommendations:  -- Torsemide 20 mg daily. --  KCl tab 40 M EQ x 2 for 1 day (total 80 M EQ for 1 day only)  --  KCl tab 20 M EQ daily thereafter --  Repeat BMET in 1 week

## 2020-10-05 NOTE — Patient Instructions (Signed)
Medication Instructions:  Your physician has recommended you make the following change in your medication:   DECREASE Torsemide 20mg  DAILY  START Nitroglycerin 0.4mg  under the tongue AS NEEDED for chest pain (see below) For as needed Nitroglycerin, if you develop chest pain: . Sit and rest 5 minutes. If chest pain does not resolve place 1 nitroglycerin under your tongue and wait 5 minutes. . If chest pain does not resolve, place a 2nd nitroglycerin under your tongue and wait 5 more minutes. . If chest pain does not resolve, place a 3rd nitroglycerin under your tongue and seek emergency services.    *If you need a refill on your cardiac medications before your next appointment, please call your pharmacy*   Lab Work:  Your physician recommends that you have lab work TODAY: BMET, Magnesium -  Please go to the [name]. You will check in at the front desk to the right as you walk into the atrium. Valet Parking is offered if needed. - No appointment needed. You may go any day between 7 am and 6 pm.   Testing/Procedures: None ordered   Follow-Up: At [facility], you and your health needs are our priority.  As part of our continuing mission to provide you with exceptional heart care, we have created designated Provider Care Teams.  These Care Teams include your primary Cardiologist (physician) and Advanced Practice Providers (APPs -  Physician Assistants and Nurse Practitioners) who all work together to provide you with the care you need, when you need it.  We recommend signing up for the patient portal called "MyChart".  Sign up information is provided on this After Visit Summary.  MyChart is used to connect with patients for Virtual Visits (Telemedicine).  Patients are able to view lab/test results, encounter notes, upcoming appointments, etc.  Non-urgent messages can be sent to your provider as well.   To learn more about what you can do with MyChart, go to  [facility].    Your next appointment:   1 month(s)  The format for your next appointment:   In Person  Provider:   You may see [email or web address], MD or one of the following Advanced Practice Providers on your designated Care Team:     Lorine Bears, Marisue Ivan

## 2020-10-05 NOTE — Progress Notes (Signed)
Office Visit    Patient Name: Jeffery Sweeney Date of Encounter: 10/05/2020  PCP:  Patient, No Pcp Per (Inactive)   [facility]  Cardiologist:  Lorine Bears, MD  Advanced Practice Provider:  No care team member to display Electrophysiologist:  None   :[id number]}   Chief Complaint    Chief Complaint  Patient presents with  . Follow-up    2 Week follow up and question about how he should be taking the Torsemide. Medications verbally reviewed with patient     34 to male with a hx of self-reported MI x3 (~5 yr prior) with subsequent PCI and CABGx3, HTN, recent tobacco use at approximately 2 cigarettes daily (quit after CABG), HLD, and seen today for recent chest pain after taking double diuretic dose since his last clinic visit.  Past Medical History    Past Medical History:  Diagnosis Date  . Hypertension   . Multiple vessel coronary artery disease   . Myocardial infarct (HCC)    x 3; PCI with single stent placement for each (in [place])  . NSTEMI (non-ST elevated myocardial infarction) [facility])    LHC 2/18 with multivessel CAD and transfer to [facility] for CABG eval  . Tobacco use    2 cigarettes daily   Past Surgical History:  Procedure Laterality Date  . CORONARY ANGIOPLASTY WITH STENT PLACEMENT     in [place] in 2018-19  . CORONARY ARTERY BYPASS GRAFT N/A 07/09/2020   Procedure: CORONARY ARTERY BYPASS GRAFTING (CABG) x THREE , USING LEFT INTERNAL MAMMARY ARTERY, AND RIGHT LEG GREATER SAPHENOUS VEIN HARVESTED ENDOSCOPICALLY;  Surgeon: Delight Ovens, MD;  Location: [facility] OR;  Service: Open Heart Surgery;  Laterality: N/A;  . INTRAVASCULAR PRESSURE WIRE/FFR STUDY N/A 07/06/2020   Procedure: INTRAVASCULAR PRESSURE WIRE/FFR STUDY;  Surgeon: Iran Ouch, MD;  Location: [facility] INVASIVE CV LAB;  Service: Cardiovascular;  Laterality: N/A;  LAD  . LEFT HEART CATH AND CORONARY ANGIOGRAPHY N/A 07/06/2020   Procedure: LEFT HEART CATH AND CORONARY  ANGIOGRAPHY;  Surgeon: Iran Ouch, MD;  Location: [facility] INVASIVE CV LAB;  Service: Cardiovascular;  Laterality: N/A;  . TEE WITHOUT CARDIOVERSION N/A 07/09/2020   Procedure: TRANSESOPHAGEAL ECHOCARDIOGRAM (TEE);  Surgeon: Delight Ovens, MD;  Location: [facility] OR;  Service: Open Heart Surgery;  Laterality: N/A;  . WRIST SURGERY Left    for cyst    Allergies  No Known Allergies  History of Present Illness    [name] is a 60 y.o. male with PMH as above.   He reports a previous history of MI x3with PCIin [place] ~5 years ago.He is a current smoker2 cigarettes daily and reports smoking for at least 30-40years.He works in a kitchen and stays busy but does not have a regular workout routine. He [place] cardiologist within [facility] after moving to [place] in 2019. He denies taking any of his cardiac medications.  On 07/04/2020, he woke with CP and SOB, similar to that before his previous MIs. CP was described as a pressure or "bear-hug" that only alleviated when breathing out very slowly. He has since continued to have episodes of CP.  He reported one episode of worsening chest pain while putting away dishes since that time.  His worst or longest episode occurred last night (07/05/2020), lasting hours.He tried Tums and antiacids with no relief. He decided to call 911. No associated nausea or emesis. No signs or symptoms of bleeding. No abdominal distention or lower extremity edema.  EMS arrived and administered nitro and ASA 324 mg in route. He reported initial improvement of his CP with start of heparin and nitro but then had recurrent symptoms/CP. In [facility], vitals significant for BP 158/121, HR 101 bpm.Labs significant for glucose 113, high-sensitivity troponin [id number],renal function stable atCr0.95, BUN 17, H&H stable.EKG showed sinus tachycardia, 105 bpm,slightdepression noted in lateralI, V5, V6 andanteriorV2-V4 and not meeting  criteria.Chest x-ray without active disease.   At the time of cardiology consultation 2/18, he reported ongoing CP and chest soreness.  He also reported left leg pain with walking, starting at the hip with diminished left femoral pulse on exam. Same day 2/18 LHC performed as copied and pasted below and showed significant three-vessel CAD.  LAD disease appeared moderate by angiography.  It was diffusely diseased in the proximal mid segment and highly significant by FFR with IFR ratio of 0.74.  Also noted was severe stenosis in the bifurcating first diagonal, second diagonal, and RCA.  Left circumflex was also significantly diseased in the mid to distal segment.  OM branches were noted to be small and likely not graftable.  LVSF and LVEDP normal.  Subsequent echo showed EF 55 to 60%, NR WMA, mild LVH, GLS -15.2%.  Recommendation was for [facility]. [facility] and underwent CABG x3 (LIMA-LAD, rSVG-PDA, SVG-ramus) on 2/21 with Dr. Tyrone SageGerhardt.  Pre-CABG carotid ultrasound showed bilateral vessels near normal with only minimal thickening or plaque.  Right and left ABI within normal range without significant evidence of arterial disease.  Outpatient vascular evaluation for L leg claudication was recommended, as well as smoking cessation.  Seen in clinic 07/23/2020, at which time he reported left-sided sternal incision numbness/numbness along the left side of his chest.  He also reported soreness of the right side of the sternal incision and along the incision site.  Ongoing right radial reduced pulse due to previous surgery of his right hand and wrist, as well as recent radial artery cath was noted.  We perfused to the right hand well.  He had right lower extremity swelling with consideration of right SVG harvesting.  He had left leg soreness, especially on the side of his leg when walking.  ABIs performed pre-CABG and with normal left side ABI, though reduced left  femoral pulse was noted during his catheterization on 2/18.  He denied any groin pain or swelling.  He reported that he had been very active since his bypass surgery.  He was doing stairs and walking around the house without a walker.  Of note, given no post CABG EKG, EKG inspected with DOD, Dr. Mariah MillingGollan, same day given diffuse T wave inversion. Recommendation was for close follow-up by DOD that day.  Seen 3/18 without exertional chest pain or chest pain at rest.  He was noted to be volume overloaded with weight gain and physical exam notable for lower extremity edema, abdominal distention, weight gain, and work of breathing.  He i was s not eating very well with lots of ice cream and boxes / prepackaged foods/high salt diet. He was not sleeping well, waking q3h.  He reported recent stressors with an uncle that recently had a stroke. He had difficulty swallowing most big pills, but agreeable to potassium supplementation with both potassium and Lasix restart due to volume overload on exam.  He had received his BP cuff via social work. He had not started cardiac rehab. No workout routine.  He quit smoking. Pain  and TTP in his right underarm and R groin noted, as well as down his R leg.  Erythema also noted. Keflex prescribed. All R sided upper and lower pulses still intact, and with consideration RUE as always 1+ as above.   Seen by Dr. Tyrone Sage 08/09/20 and volume status improved.   Seen 09/20/20 and returned to work at [employer]. Volume status improved with restarting Lasix and potassium at his previous visit.   He has returned to work since his last visit.  He reports light duty with his job at [employer].  When walking too much with this job, he reports fatigue, so he is trying to take it easy.  He also works a job at [employer], and he reports fatigue when lifting boxes, though he does state the boxes are under 20-25 pounds with light duty recommendations reviewed once more per CTS.  No racing heart  rate or palpitations.  No presyncope or syncope.  No signs or symptoms of bleeding.  He reports eating 2 slices of pizza yesterday and skipping his Lasix over the last day, which he states is the reason for his elevated blood pressure and slight elevation of volume status today.  He states that he has recently had right sided pain/twinges in his abdomen, felt when skipping his fluid pill and then when trying to sit up from bed.  It is not exertional pain, and it is usually associated with abdominal distention and bloating with high salt foods and skipping his lasix. He reports sx of GERD recently that resolved with taking a friend's PPI, which he thinks was Protonix. Again, no s/sx of bleeding.   He states he takes his fluid pill at night right now, causing him to stay up all night urinating, which is the reason he sometimes skips the pill.  We discussed the recommendation to start taking his fluid pill during the morning.  Low-salt and fluid diet reviewed. Home pressures reviewed with SBP 118-146 and DBP 80-90, improved from previous along with improved volume status as above.   Today, 10/05/20, he returns to clinic and is feeling poorly. While his AVS and office note clearly indicate that he should have taken his fluid pill once per day, when picking his fluid pill up from the pharmacy, the instructions told him to take it twice per day. He stated this was confusing to him, given he knew I had clearly said to take it only once per day. He also was unclear, given his KCL tab was still once daily. He did not call our office to clarify and so was taking his torsemide double the dose intended by the provider as we discussed today. We will of course recheck labs, as this could certainly be the reason he is feeling so poorly. He reports working long shifts lately at [employer]. He reports a 23-24h shift lately and after which time he had an episode of CP. He reports heavy lifting at times with this job with  recommendation he reduce the wt of his boxes -- work note was offered, if needed. He took an extra PPI with hope this would improve the CP but without any relief. His CP was described as occurring at night and located in the center of his chest and to the left. Specifically, he reports CP at 10pm -11pm. CP occurred at rest. He then decided that maybe his CP and feeling poorly was 2/2 taking double the dose of diuretic that he thought had been discussed in  clinic and thus started to take it once per day as of the day of today's clinic visit. He reports issues falling asleep and staying awake. He has not tried melatonin.   Home Medications    Current Outpatient Medications on File Prior to Visit  Medication Sig Dispense Refill  . aspirin EC 81 MG tablet Take 81 mg by mouth daily. Swallow whole.    Marland Kitchen atorvastatin (LIPITOR) 80 MG tablet Take 1 tablet (80 mg total) by mouth daily. 30 tablet 0  . ibuprofen (ADVIL) 200 MG tablet Take 200 mg by mouth every 6 (six) hours as needed.    . metoprolol tartrate (LOPRESSOR) 25 MG tablet Take 1.5 tablets (37.5 mg total) by mouth 2 (two) times daily. 90 tablet 6  . pantoprazole (PROTONIX) 40 MG tablet Take 1 tablet (40 mg total) by mouth daily. 30 tablet 6  . potassium chloride SA (KLOR-CON) 20 MEQ tablet Take 1 tablet (20 mEq total) by mouth daily. 30 tablet 5  . torsemide (DEMADEX) 20 MG tablet Take 1 tablet (20 mg total) by mouth 2 (two) times daily. 30 tablet 6   No current facility-administered medications on file prior to visit.    Review of Systems    He reports GERD, abdominal distention, CP, left-sided sternal chest numbness.  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 He perfuses well to his R wrist on inspection /eval today.  He denies palpitations, dyspnea, pnd, orthopnea, v, dizziness, syncope, edema, weight gain, or  early satiety. All other systems reviewed and are otherwise negative except as noted above.  Physical Exam    VS:  BP 118/80 (BP Location: Left Arm, Patient Position: Sitting, Cuff Size: Normal)   Pulse 88   Ht 5\' 3"  (1.6 m)   Wt 184 lb (83.5 kg)   SpO2 98%   BMI 32.59 kg/m  , BMI Body mass index is 32.59 kg/m. GEN: Well nourished, well developed, in no acute distress.  HEENT: normal. Neck: Supple, no JVD, carotid bruits, or masses. Cardiac: RRR, no murmurs, rubs, or gallops. No clubbing, cyanosis. Reduced 1+ R pulse which is a chronic finding due to previous surgery on R hand and wrist. RUE perfusing adequately despite reduced pulse.  He also reports chronic nerve issues with the right wrist.  L radial pulse 2+ Respiratory: CTAB. GI: Slightly firm and distended, BS + x 4. MS: no deformity or atrophy. Skin: warm and dry, no rash. Neuro:  Strength and sensation are intact. Psych: Normal affect.  Accessory Clinical Findings    ECG personally reviewed by me today - NSR, 85bpm, prolonged PRi, poor R wave progression V1 - no acute changes  VITALS Reviewed today   Temp Readings from Last 3 Encounters:  08/09/20 97.7 F (36.5 C) (Skin)  07/14/20 98.2 F (36.8 C) (Oral)  07/06/20 98.1 F (36.7 C) (Oral)   BP Readings from Last 3 Encounters:  10/05/20 118/80  09/20/20 134/84  08/09/20 119/71   Pulse Readings from Last 3 Encounters:  10/05/20 88  09/20/20 87  08/09/20 77    Wt Readings from Last 3 Encounters:  10/05/20 184 lb (83.5 kg)  09/20/20 183 lb (83 kg)  08/09/20 177 lb 8 oz (80.5 kg)     LABS  reviewed today   Lab Results  Component Value Date   WBC 9.7 08/03/2020   HGB 12.6 (L) 08/03/2020   HCT  38.6 (L) 08/03/2020   MCV 89.4 08/03/2020   PLT 286 08/03/2020   Lab Results  Component Value Date   CREATININE 0.98 08/03/2020   BUN 10 08/03/2020   NA 139 08/03/2020   K 3.7 08/03/2020   CL 104 08/03/2020   CO2 28 08/03/2020   Lab Results  Component  Value Date   ALT 18 08/03/2020   AST 13 (L) 08/03/2020   ALKPHOS 83 08/03/2020   BILITOT 0.9 08/03/2020   Lab Results  Component Value Date   CHOL 135 08/03/2020   HDL 38 (L) 08/03/2020   LDLCALC 63 08/03/2020   TRIG 170 (H) 08/03/2020   CHOLHDL 3.6 08/03/2020    Lab Results  Component Value Date   HGBA1C 5.7 (H) 07/08/2020   No results found for: TSH   STUDIES/PROCEDURES reviewed today   PostOp Echo 07/09/20 Left Ventricle: The left ventricle is unchanged from pre-bypass.  - Right Ventricle: The right ventricle appears unchanged from pre-bypass.  - Aorta: The aorta appears unchanged from pre-bypass.  - Left Atrium: The left atrium appears unchanged from pre-bypass.  - Left Atrial Appendage: The left atrial appendage appears unchanged from  pre-bypass.  - Aortic Valve: The aortic valve appears unchanged from pre-bypass.  - Mitral Valve: The mitral valve appears unchanged from pre-bypass.  - Tricuspid Valve: The tricuspid valve appears unchanged from pre-bypass.  - Interatrial Septum: The interatrial septum appears unchanged from  pre-bypass.  - Interventricular Septum: The interventricular septum appears unchanged  from  pre-bypass.  - Pericardium: The pericardium appears unchanged from pre-bypass.   Vas [place] PreCABG 07/09/20 Summary:  Right Carotid: The extracranial vessels were near-normal with only minimal  wall thickening or plaque.  Left Carotid: The extracranial vessels were near-normal with only minimal  wall        thickening or plaque.  Vertebrals: Bilateral vertebral arteries demonstrate antegrade flow.  Right ABI: Resting right ankle-brachial index is within normal range. No  evidence of significant right lower extremity arterial disease.  Left ABI: Resting left ankle-brachial index is within normal range. No  evidence of significant left lower extremity arterial disease.  Right Upper Extremity: Doppler waveforms remain within normal limits with   right radial compression. Doppler waveforms decrease >50% with right ulnar  compression.  Left Upper Extremity: Doppler waveforms decrease >50% with left radial  compression. Doppler waveforms decrease >50% with left ulnar compression.   Echo 07/06/20 1. Left ventricular ejection fraction, by estimation, is 55 to 60%. The  left ventricle has normal function. The left ventricle has no regional  wall motion abnormalities. There is mild left ventricular hypertrophy.  Left ventricular diastolic parameters  were normal. The average left ventricular global longitudinal strain is  -15.2 %. The global longitudinal strain is abnormal.  2. Right ventricular systolic function is normal. The right ventricular  size is normal.  3. The mitral valve is normal in structure. No evidence of mitral valve  regurgitation. No evidence of mitral stenosis.  4. The aortic valve is normal in structure. Aortic valve regurgitation is  not visualized. No aortic stenosis is present.  5. The inferior vena cava is normal in size with greater than 50%  respiratory variability, suggesting right atrial pressure of 3 mmHg.   LHC 07/06/2020  The left ventricular systolic function is normal.  LV end diastolic pressure is normal.  The left ventricular ejection fraction is 55-65% by visual estimate.  Prox RCA lesion is 80% stenosed.  Dist RCA lesion is 30% stenosed.  RPDA lesion is 90% stenosed.  Prox LAD to Mid LAD lesion is 60% stenosed.  1st Diag lesion is 90% stenosed.  Lat 1st Diag lesion is 90% stenosed.  Mid Cx to Dist Cx lesion is 90% stenosed.  2nd Diag lesion is 80% stenosed.  2nd Mrg lesion is 90% stenosed. 1. Significant three-vessel coronary artery disease. The LAD disease appears moderate angiographically. However, it is diffusely diseased in the proximal mid segment and was highly significant by fractional flow reserve evaluation with an IFR ratio of 0.74. In addition, there is  severe stenosis in the bifurcating first diagonal, second diagonal and right coronary artery. The left circumflex is also significantly diseased in the mid to distal segment but OM branches are small and likely not graftable. 2. Normal LV systolic function and normal left ventricular end-diastolic pressure. Recommendations: I recommend transfer to [facility] for CABG evaluation. Resume heparin drip 6 hours after sheath pull.  Assessment & Plan   S/p CABG x3 (06/2020) History of CAD s/p remote self reported PCI --Reports recent CP and after taking double the intended diuresis without increase of KCl tab. Will recheck BMET.  Echo with EF 55 to 60%.  S/p CABG x3 2/21(LIMA to LAD, SVG to PDA, SVG -ramus).  Instructed pt to reduce back to recommended torsemide once daily, given he is likely on the drier side at this time and may have low K on repeat labs. Will check Mg as well. Continue medical management with aggressive risk factor modification.  We reviewed ongoing wt lifting precautions s/p CABG / lifestyle changes in detail today. Ongoing smoking cessation.  Increase activity as tolerated.   HFpEF (55-60%) --Reports stable breathing and abd distention. Previous Echo with EF 55 to 60%. Recommended he take his fluid pill once daily and in the AM to prevent urination interrupting his sleep and thus increase compliance with diuresis. Torsemide 20mg  daily with KCl supplementation started at last visit but (despite AVS and notes indicating once daily torsemide), prescribed with incorrect frequency of dose and thus pt has been taking twice daily without increasing K supplementation  - will check labs.  Reassess HR, BP, and volume status again at RTC.  HTN, BP goal <130/80 --BP well controlled and after torsemide 20mg  daily (to be taken in AM) was taken twice daily and not with double the KCl . Given need for HR / BP control, he was also increased to lopressor 37.5mg  BID at last visit. Defer any  change from lopressor to Coreg at this time given recent double diuresis between visits. Low-salt diet and fluid restriction reviewed today. Continue to monitor home BP.   HLD, LDL goal <70 --LDL 63, well controlled. Continue statin.   Recent previous tobacco use -Quit smoking after discharge.  He reports complete smoking cessation.  Congratulations provided.  GERD sx --Reports recent GERD sx, though is taking PPI with some success. No s/sx of bleeding. Continue Protonix 40mg  daily.    Medication changes:  None - corrected that should only be taking torsemide once daily, PRN SL nitro Labs ordered: BMET, Mg Studies / Imaging ordered: None Future considerations:Pending labs.   Disposition: RTC 1 mo   , PA-C

## 2020-10-07 NOTE — Progress Notes (Incomplete)
Office Visit    Patient Name: Jeffery Sweeney Date of Encounter: 10/05/2020  PCP:  Patient, No Pcp Per (Inactive)   [facility]  Cardiologist:  Lorine Bears, MD  Advanced Practice Provider:  No care team member to display Electrophysiologist:  None   :[id number]}   Chief Complaint    Chief Complaint  Patient presents with  . Follow-up    2 Week follow up and question about how he should be taking the Torsemide. Medications verbally reviewed with patient     34 to male with a hx of self-reported MI x3 (~5 yr prior) with subsequent PCI and CABGx3, HTN, recent tobacco use at approximately 2 cigarettes daily (quit after CABG), HLD, and seen today for recent chest pain after taking double diuretic dose since his last clinic visit.  Past Medical History    Past Medical History:  Diagnosis Date  . Hypertension   . Multiple vessel coronary artery disease   . Myocardial infarct (HCC)    x 3; PCI with single stent placement for each (in [place])  . NSTEMI (non-ST elevated myocardial infarction) [facility])    LHC 2/18 with multivessel CAD and transfer to [facility] for CABG eval  . Tobacco use    2 cigarettes daily   Past Surgical History:  Procedure Laterality Date  . CORONARY ANGIOPLASTY WITH STENT PLACEMENT     in [place] in 2018-19  . CORONARY ARTERY BYPASS GRAFT N/A 07/09/2020   Procedure: CORONARY ARTERY BYPASS GRAFTING (CABG) x THREE , USING LEFT INTERNAL MAMMARY ARTERY, AND RIGHT LEG GREATER SAPHENOUS VEIN HARVESTED ENDOSCOPICALLY;  Surgeon: Delight Ovens, MD;  Location: [facility] OR;  Service: Open Heart Surgery;  Laterality: N/A;  . INTRAVASCULAR PRESSURE WIRE/FFR STUDY N/A 07/06/2020   Procedure: INTRAVASCULAR PRESSURE WIRE/FFR STUDY;  Surgeon: Iran Ouch, MD;  Location: [facility] INVASIVE CV LAB;  Service: Cardiovascular;  Laterality: N/A;  LAD  . LEFT HEART CATH AND CORONARY ANGIOGRAPHY N/A 07/06/2020   Procedure: LEFT HEART CATH AND CORONARY  ANGIOGRAPHY;  Surgeon: Iran Ouch, MD;  Location: [facility] INVASIVE CV LAB;  Service: Cardiovascular;  Laterality: N/A;  . TEE WITHOUT CARDIOVERSION N/A 07/09/2020   Procedure: TRANSESOPHAGEAL ECHOCARDIOGRAM (TEE);  Surgeon: Delight Ovens, MD;  Location: [facility] OR;  Service: Open Heart Surgery;  Laterality: N/A;  . WRIST SURGERY Left    for cyst    Allergies  No Known Allergies  History of Present Illness    Jeffery Sweeney is a 60 y.o. male with PMH as above.   He reports a previous history of MI x3with PCIin [place] ~5 years ago.He is a current smoker2 cigarettes daily and reports smoking for at least 30-40years.He works in a kitchen and stays busy but does not have a regular workout routine. He [place] cardiologist within [facility] after moving to [place] in 2019. He denies taking any of his cardiac medications.  On 07/04/2020, he woke with CP and SOB, similar to that before his previous MIs. CP was described as a pressure or "bear-hug" that only alleviated when breathing out very slowly. He has since continued to have episodes of CP.  He reported one episode of worsening chest pain while putting away dishes since that time.  His worst or longest episode occurred last night (07/05/2020), lasting hours.He tried Tums and antiacids with no relief. He decided to call 911. No associated nausea or emesis. No signs or symptoms of bleeding. No abdominal distention or lower extremity edema.  EMS arrived and administered nitro and ASA 324 mg in route. He reported initial improvement of his CP with start of heparin and nitro but then had recurrent symptoms/CP. In [facility] ED [facility], vitals significant for BP 158/121, HR 101 bpm.Labs significant for glucose 113, high-sensitivity troponin [id number],renal function stable atCr0.95, BUN 17, H&H stable.EKG showed sinus tachycardia, 105 bpm,slightdepression noted in lateralI, V5, V6 andanteriorV2-V4 and not meeting  criteria.Chest x-ray without active disease.   At the time of cardiology consultation 2/18, he reported ongoing CP and chest soreness.  He also reported left leg pain with walking, starting at the hip with diminished left femoral pulse on exam. Same day 2/18 LHC performed as copied and pasted below and showed significant three-vessel CAD.  LAD disease appeared moderate by angiography.  It was diffusely diseased in the proximal mid segment and highly significant by FFR with IFR ratio of 0.74.  Also noted was severe stenosis in the bifurcating first diagonal, second diagonal, and RCA.  Left circumflex was also significantly diseased in the mid to distal segment.  OM branches were noted to be small and likely not graftable.  LVSF and LVEDP normal.  Subsequent echo showed EF 55 to 60%, NR WMA, mild LVH, GLS -15.2%.  Recommendation was for transfer [facility]. [facility] and underwent CABG x3 (LIMA-LAD, rSVG-PDA, SVG-ramus) on 2/21 with Dr. Tyrone SageGerhardt.  Pre-CABG carotid ultrasound showed bilateral vessels near normal with only minimal thickening or plaque.  Right and left ABI within normal range without significant evidence of arterial disease.  Outpatient vascular evaluation for L leg claudication was recommended, as well as smoking cessation.  Seen in clinic 07/23/2020, at which time he reported left-sided sternal incision numbness/numbness along the left side of his chest.  He also reported soreness of the right side of the sternal incision and along the incision site.  Ongoing right radial reduced pulse due to previous surgery of his right hand and wrist, as well as recent radial artery cath was noted.  We perfused to the right hand well.  He had right lower extremity swelling with consideration of right SVG harvesting.  He had left leg soreness, especially on the side of his leg when walking.  ABIs performed pre-CABG and with normal left side ABI, though reduced left  femoral pulse was noted during his catheterization on 2/18.  He denied any groin pain or swelling.  He reported that he had been very active since his bypass surgery.  He was doing stairs and walking around the house without a walker.  Of note, given no post CABG EKG, EKG inspected with DOD, Dr. Mariah MillingGollan, same day given diffuse T wave inversion. Recommendation was for close follow-up by DOD that day.  Seen 3/18 without exertional chest pain or chest pain at rest.  He was noted to be volume overloaded with weight gain and physical exam notable for lower extremity edema, abdominal distention, weight gain, and work of breathing.  He i was s not eating very well with lots of ice cream and boxes / prepackaged foods/high salt diet. He was not sleeping well, waking q3h.  He reported recent stressors with an uncle that recently had a stroke. He had difficulty swallowing most big pills, but agreeable to potassium supplementation with both potassium and Lasix restart due to volume overload on exam.  He had received his BP cuff via social work. He had not started cardiac rehab. No workout routine.  He quit smoking. Pain  and TTP in his right underarm and R groin noted, as well as down his R leg.  Erythema also noted. Keflex prescribed. All R sided upper and lower pulses still intact, and with consideration RUE as always 1+ as above.   Seen by Dr. Tyrone Sage 08/09/20 and volume status improved.   Seen 09/20/20 and returned to work at [employer]. Volume status improved with restarting Lasix and potassium at his previous visit.   He has returned to work since his last visit.  He reports light duty with his job at [employer].  When walking too much with this job, he reports fatigue, so he is trying to take it easy.  He also works a job at [employer], and he reports fatigue when lifting boxes, though he does state the boxes are under 20-25 pounds with light duty recommendations reviewed once more per CTS.  No racing heart  rate or palpitations.  No presyncope or syncope.  No signs or symptoms of bleeding.  He reports eating 2 slices of pizza yesterday and skipping his Lasix over the last day, which he states is the reason for his elevated blood pressure and slight elevation of volume status today.  He states that he has recently had right sided pain/twinges in his abdomen, felt when skipping his fluid pill and then when trying to sit up from bed.  It is not exertional pain, and it is usually associated with abdominal distention and bloating with high salt foods and skipping his lasix. He reports sx of GERD recently that resolved with taking a friend's PPI, which he thinks was Protonix. Again, no s/sx of bleeding.   He states he takes his fluid pill at night right now, causing him to stay up all night urinating, which is the reason he sometimes skips the pill.  We discussed the recommendation to start taking his fluid pill during the morning.  Low-salt and fluid diet reviewed. Home pressures reviewed with SBP 118-146 and DBP 80-90, improved from previous along with improved volume status as above.   Today, 10/05/20, he returns to clinic and is feeling poorly. While his AVS and office note clearly indicate that he should have taken his fluid pill once per day, when picking his fluid pill up from the pharmacy, the instructions told him to take it twice per day. He stated this was confusing to him, given he knew I had clearly said to take it only once per day. He also was unclear, given his KCL tab was still once daily. He did not call our office to clarify and so was taking his torsemide double the dose intended by the provider as we discussed today. We will of course recheck labs, as this could certainly be the reason he is feeling so poorly. He reports working long shifts lately at [employer]. He reports a 23-24h shift lately and after which time he had an episode of CP. He reports heavy lifting at times with this job with  recommendation he reduce the wt of his boxes -- work note was offered, if needed. He took an extra PPI with hope this would improve the CP but without any relief. His CP was described as occurring at night and located in the center of his chest and to the left. Specifically, he reports CP at 10pm -11pm. CP occurred at rest. He then decided that maybe his CP and feeling poorly was 2/2 taking double the dose of diuretic that he thought had been discussed in  clinic and thus started to take it once per day as of the day of today's clinic visit. He reports issues falling asleep and staying awake. He has not tried melatonin.   Home Medications    Current Outpatient Medications on File Prior to Visit  Medication Sig Dispense Refill  . aspirin EC 81 MG tablet Take 81 mg by mouth daily. Swallow whole.    Marland Kitchen atorvastatin (LIPITOR) 80 MG tablet Take 1 tablet (80 mg total) by mouth daily. 30 tablet 0  . ibuprofen (ADVIL) 200 MG tablet Take 200 mg by mouth every 6 (six) hours as needed.    . metoprolol tartrate (LOPRESSOR) 25 MG tablet Take 1.5 tablets (37.5 mg total) by mouth 2 (two) times daily. 90 tablet 6  . pantoprazole (PROTONIX) 40 MG tablet Take 1 tablet (40 mg total) by mouth daily. 30 tablet 6  . potassium chloride SA (KLOR-CON) 20 MEQ tablet Take 1 tablet (20 mEq total) by mouth daily. 30 tablet 5  . torsemide (DEMADEX) 20 MG tablet Take 1 tablet (20 mg total) by mouth 2 (two) times daily. 30 tablet 6   No current facility-administered medications on file prior to visit.    Review of Systems    He reports GERD, abdominal distention, CP, left-sided sternal chest numbness.  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 He perfuses well to his R wrist on inspection /eval today.  He denies palpitations, dyspnea, pnd, orthopnea, v, dizziness, syncope, edema, weight gain, or  early satiety. All other systems reviewed and are otherwise negative except as noted above.  Physical Exam    VS:  BP 118/80 (BP Location: Left Arm, Patient Position: Sitting, Cuff Size: Normal)   Pulse 88   Ht 5\' 3"  (1.6 m)   Wt 184 lb (83.5 kg)   SpO2 98%   BMI 32.59 kg/m  , BMI Body mass index is 32.59 kg/m. GEN: Well nourished, well developed, in no acute distress.  HEENT: normal. Neck: Supple, no JVD, carotid bruits, or masses. Cardiac: RRR, no murmurs, rubs, or gallops. No clubbing, cyanosis. Reduced 1+ R pulse which is a chronic finding due to previous surgery on R hand and wrist. RUE perfusing adequately despite reduced pulse.  He also reports chronic nerve issues with the right wrist.  L radial pulse 2+ Respiratory: CTAB. GI: Slightly firm and distended, BS + x 4. MS: no deformity or atrophy. Skin: warm and dry, no rash. Neuro:  Strength and sensation are intact. Psych: Normal affect.  Accessory Clinical Findings    ECG personally reviewed by me today - NSR, 85bpm, prolonged PRi, poor R wave progression V1 - no acute changes  VITALS Reviewed today   Temp Readings from Last 3 Encounters:  08/09/20 97.7 F (36.5 C) (Skin)  07/14/20 98.2 F (36.8 C) (Oral)  07/06/20 98.1 F (36.7 C) (Oral)   BP Readings from Last 3 Encounters:  10/05/20 118/80  09/20/20 134/84  08/09/20 119/71   Pulse Readings from Last 3 Encounters:  10/05/20 88  09/20/20 87  08/09/20 77    Wt Readings from Last 3 Encounters:  10/05/20 184 lb (83.5 kg)  09/20/20 183 lb (83 kg)  08/09/20 177 lb 8 oz (80.5 kg)     LABS  reviewed today   Lab Results  Component Value Date   WBC 9.7 08/03/2020   HGB 12.6 (L) 08/03/2020   HCT  38.6 (L) 08/03/2020   MCV 89.4 08/03/2020   PLT 286 08/03/2020   Lab Results  Component Value Date   CREATININE 0.98 08/03/2020   BUN 10 08/03/2020   NA 139 08/03/2020   K 3.7 08/03/2020   CL 104 08/03/2020   CO2 28 08/03/2020   Lab Results  Component  Value Date   ALT 18 08/03/2020   AST 13 (L) 08/03/2020   ALKPHOS 83 08/03/2020   BILITOT 0.9 08/03/2020   Lab Results  Component Value Date   CHOL 135 08/03/2020   HDL 38 (L) 08/03/2020   LDLCALC 63 08/03/2020   TRIG 170 (H) 08/03/2020   CHOLHDL 3.6 08/03/2020    Lab Results  Component Value Date   HGBA1C 5.7 (H) 07/08/2020   No results found for: TSH   STUDIES/PROCEDURES reviewed today   PostOp Echo 07/09/20 Left Ventricle: The left ventricle is unchanged from pre-bypass.  - Right Ventricle: The right ventricle appears unchanged from pre-bypass.  - Aorta: The aorta appears unchanged from pre-bypass.  - Left Atrium: The left atrium appears unchanged from pre-bypass.  - Left Atrial Appendage: The left atrial appendage appears unchanged from  pre-bypass.  - Aortic Valve: The aortic valve appears unchanged from pre-bypass.  - Mitral Valve: The mitral valve appears unchanged from pre-bypass.  - Tricuspid Valve: The tricuspid valve appears unchanged from pre-bypass.  - Interatrial Septum: The interatrial septum appears unchanged from  pre-bypass.  - Interventricular Septum: The interventricular septum appears unchanged  from  pre-bypass.  - Pericardium: The pericardium appears unchanged from pre-bypass.   Vas [place] PreCABG 07/09/20 Summary:  Right Carotid: The extracranial vessels were near-normal with only minimal  wall thickening or plaque.  Left Carotid: The extracranial vessels were near-normal with only minimal  wall        thickening or plaque.  Vertebrals: Bilateral vertebral arteries demonstrate antegrade flow.  Right ABI: Resting right ankle-brachial index is within normal range. No  evidence of significant right lower extremity arterial disease.  Left ABI: Resting left ankle-brachial index is within normal range. No  evidence of significant left lower extremity arterial disease.  Right Upper Extremity: Doppler waveforms remain within normal limits with   right radial compression. Doppler waveforms decrease >50% with right ulnar  compression.  Left Upper Extremity: Doppler waveforms decrease >50% with left radial  compression. Doppler waveforms decrease >50% with left ulnar compression.   Echo 07/06/20 1. Left ventricular ejection fraction, by estimation, is 55 to 60%. The  left ventricle has normal function. The left ventricle has no regional  wall motion abnormalities. There is mild left ventricular hypertrophy.  Left ventricular diastolic parameters  were normal. The average left ventricular global longitudinal strain is  -15.2 %. The global longitudinal strain is abnormal.  2. Right ventricular systolic function is normal. The right ventricular  size is normal.  3. The mitral valve is normal in structure. No evidence of mitral valve  regurgitation. No evidence of mitral stenosis.  4. The aortic valve is normal in structure. Aortic valve regurgitation is  not visualized. No aortic stenosis is present.  5. The inferior vena cava is normal in size with greater than 50%  respiratory variability, suggesting right atrial pressure of 3 mmHg.   LHC 07/06/2020  The left ventricular systolic function is normal.  LV end diastolic pressure is normal.  The left ventricular ejection fraction is 55-65% by visual estimate.  Prox RCA lesion is 80% stenosed.  Dist RCA lesion is 30% stenosed.  RPDA lesion is 90% stenosed.  Prox LAD to Mid LAD lesion is 60% stenosed.  1st Diag lesion is 90% stenosed.  Lat 1st Diag lesion is 90% stenosed.  Mid Cx to Dist Cx lesion is 90% stenosed.  2nd Diag lesion is 80% stenosed.  2nd Mrg lesion is 90% stenosed. 1. Significant three-vessel coronary artery disease. The LAD disease appears moderate angiographically. However, it is diffusely diseased in the proximal mid segment and was highly significant by fractional flow reserve evaluation with an IFR ratio of 0.74. In addition, there is  severe stenosis in the bifurcating first diagonal, second diagonal and right coronary artery. The left circumflex is also significantly diseased in the mid to distal segment but OM branches are small and likely not graftable. 2. Normal LV systolic function and normal left ventricular end-diastolic pressure. Recommendations: I recommend transfer to [facility] for CABG evaluation. Resume heparin drip 6 hours after sheath pull.  Assessment & Plan   S/p CABG x3 (06/2020) History of CAD s/p remote self reported PCI --Reports recent CP and after taking double the intended diuresis without increase of KCl tab. Will recheck BMET.  Echo with EF 55 to 60%.  S/p CABG x3 2/21(LIMA to LAD, SVG to PDA, SVG -ramus).  Instructed pt to reduce back to recommended torsemide once daily, given he is likely on the drier side at this time and may have low K on repeat labs. Will check Mg as well. Continue medical management with aggressive risk factor modification.  We reviewed ongoing wt lifting precautions s/p CABG / lifestyle changes in detail today. Ongoing smoking cessation.  Increase activity as tolerated.   HFpEF (55-60%) --Reports stable breathing and abd distention. Previous Echo with EF 55 to 60%. Recommended he take his fluid pill once daily and in the AM to prevent urination interrupting his sleep and thus increase compliance with diuresis. Torsemide 20mg  daily with KCl supplementation started at last visit but (despite AVS and notes indicating once daily torsemide), prescribed with incorrect frequency of dose and thus pt has been taking twice daily without increasing K supplementation  - will check labs.  Reassess HR, BP, and volume status again at RTC.  HTN, BP goal <130/80 --BP well controlled and after torsemide 20mg  daily (to be taken in AM) was taken twice daily and not with double the KCl . Given need for HR / BP control, will also increase to lopressor 37.5mg  BID. At RTC, change lopressor to  Coreg if DBP still elevated, as this is GDMT and better targets DBP control. Low-salt diet and fluid restriction reviewed today. He did get his BP cuff from social work and is monitoring at home with reported elevated home pressures at times with SBP 120-140s, improved from previous home readings.   HLD, LDL goal <70 --LDL 63, well controlled. Continue statin.   Recent previous tobacco use -Quit smoking after discharge.  He reports complete smoking cessation.  Congratulations provided.  GERD sx --Reports recent GERD sx. No s/sx of bleeding. Start Protonix 40mg  daily.    Medication changes: Protonix 40mg  qd, lasix 40mg  daily discontinued, started torsemide 20mg  daily started, lopressor increased to 37.5mg  BID Labs ordered: None Studies / Imaging ordered: None Future considerations: At RTC, if DBP still elevated, recommend transition from Lopressor to carvedilol for more optimal control of DBP.   Disposition: RTC within 2 weeks to reassess given med changes above   , PA-C

## 2020-10-07 NOTE — Telephone Encounter (Signed)
Attempted to call over the weekend. Went to [employer].

## 2020-10-08 NOTE — Telephone Encounter (Signed)
Patient returning call from the weekend

## 2020-10-08 NOTE — Telephone Encounter (Signed)
Spoke with the patient. Patient made aware of lab results and Marisue Ivan, PA recommendation. Recommendations:  -- Torsemide 20 mg daily. -- KCl tab 40 M EQ x 2 for 1 day (total 80 M EQ for 1 day only)  -- KCl tab 20 M EQ daily thereafter -- Repeat BMET in 1 week  Lab order placed for repeat bmp to be [facility]. Patient verbalized understanding to the instructions given.

## 2020-10-09 ENCOUNTER — Telehealth: Payer: Self-pay | Admitting: Physician Assistant

## 2020-10-09 NOTE — Telephone Encounter (Signed)
Patient calling in stating he had trouble getting up this morning. This has happened two days in a row. Patient reports left side was giving him trouble around his hip and he had to slide out of bed. Patient is wanting to know if this could be caused by his cholesterol medication.

## 2020-10-09 NOTE — Telephone Encounter (Signed)
Spoke with patient and he reports some discomfort to his left hip. He also states he continues to have numbness to his chest and under his ribs (recent CABG). Advised that this hip pain is probably not from his cholesterol medication. He wanted to check in with [place] about this. Recommended he follow up with his primary care provider but he has not obtained one at this time. Let him know that I would forward this to provider for review and would be in touch if she has any further recommendations. He verbalized understanding with no further questions.

## 2020-10-10 ENCOUNTER — Emergency Department
Admission: EM | Admit: 2020-10-10 | Discharge: 2020-10-10 | Disposition: A | Discharge status: Home / Self Care | Payer: Self-pay | Attending: Emergency Medicine | Admitting: Emergency Medicine

## 2020-10-10 ENCOUNTER — Other Ambulatory Visit: Payer: Self-pay

## 2020-10-10 ENCOUNTER — Emergency Department: Payer: Self-pay

## 2020-10-10 DIAGNOSIS — Z951 Presence of aortocoronary bypass graft: Secondary | ICD-10-CM | POA: Insufficient documentation

## 2020-10-10 DIAGNOSIS — R0602 Shortness of breath: Secondary | ICD-10-CM | POA: Insufficient documentation

## 2020-10-10 DIAGNOSIS — I2510 Atherosclerotic heart disease of native coronary artery without angina pectoris: Secondary | ICD-10-CM | POA: Insufficient documentation

## 2020-10-10 DIAGNOSIS — Z955 Presence of coronary angioplasty implant and graft: Secondary | ICD-10-CM | POA: Insufficient documentation

## 2020-10-10 DIAGNOSIS — Z79899 Other long term (current) drug therapy: Secondary | ICD-10-CM | POA: Insufficient documentation

## 2020-10-10 DIAGNOSIS — R0789 Other chest pain: Secondary | ICD-10-CM | POA: Insufficient documentation

## 2020-10-10 DIAGNOSIS — Z87891 Personal history of nicotine dependence: Secondary | ICD-10-CM | POA: Insufficient documentation

## 2020-10-10 DIAGNOSIS — I10 Essential (primary) hypertension: Secondary | ICD-10-CM | POA: Insufficient documentation

## 2020-10-10 DIAGNOSIS — Z7982 Long term (current) use of aspirin: Secondary | ICD-10-CM | POA: Insufficient documentation

## 2020-10-10 LAB — BASIC METABOLIC PANEL
Anion gap: 9 (ref 5–15)
BUN: 18 mg/dL (ref 6–20)
CO2: 26 mmol/L (ref 22–32)
Calcium: 9 mg/dL (ref 8.9–10.3)
Chloride: 103 mmol/L (ref 98–111)
Creatinine, Ser: 0.91 mg/dL (ref 0.61–1.24)
GFR, Estimated: >60 mL/min (ref >60)
Glucose, Bld: 134 mg/dL — ABNORMAL HIGH (ref 70–99)
Potassium: 3.9 mmol/L (ref 3.5–5.1)
Sodium: 138 mmol/L (ref 135–145)

## 2020-10-10 LAB — CBC
HCT: 42.6 % (ref 39.0–52.0)
Hemoglobin: 14.2 g/dL (ref 13.0–17.0)
MCH: 29.3 pg (ref 26.0–34.0)
MCHC: 33.3 g/dL (ref 30.0–36.0)
MCV: 87.8 fL (ref 80.0–100.0)
Platelets: 255 10*3/uL (ref 150–400)
RBC: 4.85 MIL/uL (ref 4.22–5.81)
RDW: 13.7 % (ref 11.5–15.5)
WBC: 7.2 10*3/uL (ref 4.0–10.5)
nRBC: 0 % (ref 0.0–0.2)

## 2020-10-10 LAB — TROPONIN I (HIGH SENSITIVITY)
Troponin I (High Sensitivity): 3 ng/L (ref <18)
Troponin I (High Sensitivity): 3 ng/L (ref <18)

## 2020-10-10 MED ORDER — HYDROCODONE-ACETAMINOPHEN 5-325 MG PO TABS
2.0000 | ORAL_TABLET | Freq: Once | ORAL | Status: AC
Start: 2020-10-10 — End: 2020-10-10
  Administered 2020-10-10: 2 via ORAL
  Filled 2020-10-10: qty 2

## 2020-10-10 MED ORDER — HYDROCODONE-ACETAMINOPHEN 5-325 MG PO TABS: 1.0000 | ORAL_TABLET | Freq: Four times a day (QID) | ORAL | 0 refills | Status: DC | PRN

## 2020-10-10 NOTE — Telephone Encounter (Signed)
It looks like the patient is currently in the emergency department. Triage notes say he had 1 episode of CP that resolved with SL nitro x1. His initial EKG looks OK. His potassium looks OK. No note yet from the MD and no consultation yet to our team. Just wanted to update documentation to include this information, since I noticed he had just hit the ED.

## 2020-10-10 NOTE — Addendum Note (Signed)
Addended by: Festus Aloe on: 10/10/2020 04:18 PM   Modules accepted: Orders

## 2020-10-10 NOTE — ED Provider Notes (Signed)
[facility] Emergency Department Provider Note  ____________________________________________   Event Date/Time   First MD Initiated Contact with Patient 10/10/20 0800     (approximate)  I have reviewed the triage vital signs and the nursing notes.   HISTORY  Chief Complaint Chest Pain    HPI Jeffery Sweeney is a 60 y.o. male    with history of recent CABG, multiple vessel coronary disease, tobacco use, here with chest pain.  The patient states that earlier today, he developed a sharp, somewhat positional, left upper chest pain.  It felt worse with movement and palpation.  He states he also felt more fatigued and short of breath.  He took a nitroglycerin, which did seem to help although he was also resting and had changed positions at the time.  His pain is improved but persistent here.  No recent medication changes.  He has been taking his meds as prescribed.  He does note he has been lifting and moving a little bit more, as he has been recovering from his sternotomy.  Denies any fevers or chills.  No cough.  No known COVID exposures.       Past Medical History:  Diagnosis Date  . Hypertension   . Multiple vessel coronary artery disease   . Myocardial infarct (HCC)    x 3; PCI with single stent placement for each (in [place])  . NSTEMI (non-ST elevated myocardial infarction) [facility])    LHC 2/18 with multivessel CAD and transfer to [facility] for CABG eval  . Tobacco use    2 cigarettes daily    Patient Active Problem List   Diagnosis Date Noted  . 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  . Chest pain 07/06/2020  . CAD, multiple vessel 07/06/2020  . 3-vessel CAD 07/06/2020  . Hypertension   . Tobacco abuse   . Non-ST elevation (NSTEMI) myocardial infarction (HCC)   . Unstable angina (HCC) 07/05/2020    Past Surgical History:  Procedure Laterality Date  . CORONARY ANGIOPLASTY WITH STENT PLACEMENT     in [place] in 2018-19  . CORONARY ARTERY BYPASS GRAFT N/A  07/09/2020   Procedure: CORONARY ARTERY BYPASS GRAFTING (CABG) x THREE , USING LEFT INTERNAL MAMMARY ARTERY, AND RIGHT LEG GREATER SAPHENOUS VEIN HARVESTED ENDOSCOPICALLY;  Surgeon: Delight Ovens, MD;  Location: [facility] OR;  Service: Open Heart Surgery;  Laterality: N/A;  . INTRAVASCULAR PRESSURE WIRE/FFR STUDY N/A 07/06/2020   Procedure: INTRAVASCULAR PRESSURE WIRE/FFR STUDY;  Surgeon: Iran Ouch, MD;  Location: [facility] INVASIVE CV LAB;  Service: Cardiovascular;  Laterality: N/A;  LAD  . LEFT HEART CATH AND CORONARY ANGIOGRAPHY N/A 07/06/2020   Procedure: LEFT HEART CATH AND CORONARY ANGIOGRAPHY;  Surgeon: Iran Ouch, MD;  Location: [facility] INVASIVE CV LAB;  Service: Cardiovascular;  Laterality: N/A;  . TEE WITHOUT CARDIOVERSION N/A 07/09/2020   Procedure: TRANSESOPHAGEAL ECHOCARDIOGRAM (TEE);  Surgeon: Delight Ovens, MD;  Location: [facility] OR;  Service: Open Heart Surgery;  Laterality: N/A;  . WRIST SURGERY Left    for cyst    Prior to Admission medications   Medication Sig Start Date End Date Taking? Authorizing Provider  HYDROcodone-acetaminophen (NORCO/VICODIN) 5-325 MG tablet Take 1 tablet by mouth every 6 (six) hours as needed for severe pain. 10/10/20 10/10/21 Yes Shaune Pollack, MD  aspirin EC 81 MG tablet Take 81 mg by mouth daily. Swallow whole.    [provider]  atorvastatin (LIPITOR) 80 MG tablet Take 1 tablet (80 mg total) by mouth daily.  09/26/20   Marisue IvanVisser, Jacquelyn D, PA-C  ibuprofen (ADVIL) 200 MG tablet Take 200 mg by mouth every 6 (six) hours as needed.    [provider]  metoprolol tartrate (LOPRESSOR) 25 MG tablet Take 1.5 tablets (37.5 mg total) by mouth 2 (two) times daily. 09/20/20   Marisue IvanVisser, Jacquelyn D, PA-C  nitroGLYCERIN (NITROSTAT) 0.4 MG SL tablet Place 1 tablet (0.4 mg total) under the tongue every 5 (five) minutes as needed for chest pain. 10/05/20   Marisue IvanVisser, Jacquelyn D, PA-C  pantoprazole (PROTONIX) 40 MG tablet Take 1 tablet (40 mg total) by  mouth daily. 09/20/20   Marisue IvanVisser, Jacquelyn D, PA-C  potassium chloride SA (KLOR-CON) 20 MEQ tablet Take 1 tablet (20 mEq total) by mouth daily. 08/03/20 01/30/21  Marisue IvanVisser, Jacquelyn D, PA-C  torsemide (DEMADEX) 20 MG tablet Take 1 tablet (20 mg total) by mouth daily. 10/05/20 04/03/21  Marisue IvanVisser, Jacquelyn D, PA-C    Allergies Patient has no known allergies.  No family history on file.  Social History Social History   Tobacco Use  . Smoking status: Former Smoker    Packs/day: 0.50    Types: Cigarettes    Quit date: 07/06/2020    Years since quitting: 0.2  . Smokeless tobacco: Never Used  Vaping Use  . Vaping Use: Never used  Substance Use Topics  . Alcohol use: Not Currently  . Drug use: Not Currently    Review of Systems  Review of Systems  Constitutional: Positive for fatigue. Negative for chills and fever.  HENT: Negative for sore throat.   Respiratory: Positive for shortness of breath.   Cardiovascular: Positive for chest pain.  Gastrointestinal: Negative for abdominal pain.  Genitourinary: Negative for flank pain.  Musculoskeletal: Negative for neck pain.  Skin: Negative for rash and wound.  Allergic/Immunologic: Negative for immunocompromised state.  Neurological: Negative for weakness and numbness.  Hematological: Does not bruise/bleed easily.  All other systems reviewed and are negative.    ____________________________________________  PHYSICAL EXAM:      VITAL SIGNS: ED Triage Vitals  Enc Vitals Group     BP 10/10/20 0746 108/78     Pulse Rate 10/10/20 0746 78     Resp 10/10/20 0746 16     Temp 10/10/20 0746 98.4 F (36.9 C)     Temp Source 10/10/20 0746 Oral     SpO2 10/10/20 0746 99 %     Weight 10/10/20 0744 184 lb (83.5 kg)     Height 10/10/20 0744 5\' 3"  (1.6 m)     Head Circumference --      Peak Flow --      Pain Score 10/10/20 0744 8     Pain Loc --      Pain Edu? --      Excl. in GC? --      Physical Exam Vitals and nursing note reviewed.   Constitutional:      General: He is not in acute distress.    Appearance: He is well-developed.  HENT:     Head: Normocephalic and atraumatic.  Eyes:     Conjunctiva/sclera: Conjunctivae normal.  Cardiovascular:     Heart sounds: Normal heart sounds. No murmur heard. No friction rub.  Pulmonary:     Effort: Pulmonary effort is normal. No respiratory distress.     Breath sounds: Normal breath sounds. No wheezing or rales.  Chest:     Comments: Healing midline sternotomy scar.  Mild tenderness to palpation of the left upper parasternal border.  No skin lesions.  No deformity. Abdominal:     General: There is no distension.     Palpations: Abdomen is soft.     Tenderness: There is no abdominal tenderness.  Musculoskeletal:     Cervical back: Neck supple.  Skin:    General: Skin is warm.     Capillary Refill: Capillary refill takes less than 2 seconds.  Neurological:     Mental Status: He is alert and oriented to person, place, and time.     Motor: No abnormal muscle tone.       ____________________________________________   LABS (all labs ordered are listed, but only abnormal results are displayed)  Labs Reviewed  BASIC METABOLIC PANEL - Abnormal; Notable for the following components:      Result Value   Glucose, Bld 134 (*)    All other components within normal limits  CBC  TROPONIN I (HIGH SENSITIVITY)  TROPONIN I (HIGH SENSITIVITY)    ____________________________________________  EKG: Normal sinus rhythm, ventricular rate 80.  PR 177, QRS 89, QTc 440.  No acute ST elevations or depressions.  No EKG evidence of acute ischemia or infarct. ________________________________________  RADIOLOGY All imaging, including plain films, CT scans, and ultrasounds, independently reviewed by me, and interpretations confirmed via formal radiology reads.  ED MD interpretation:   Chest x-ray: Clear  Official radiology report(s): DG Chest 2 View  Result Date:  10/10/2020 CLINICAL DATA:  Mild central chest pain, weakness, fatigue, and shortness of breath this morning, improvement with nitroglycerin, history coronary artery disease post MI, hypertension, former smoker EXAM: CHEST - 2 VIEW COMPARISON:  08/09/2020 FINDINGS: Normal heart size post CABG. Mediastinal contours and pulmonary vascularity normal. Lungs clear. No pulmonary infiltrate, pleural effusion, or pneumothorax. Osseous structures stable. IMPRESSION: Post CABG. No acute abnormalities. Electronically Signed   By: Ulyses Southward M.D.   On: 10/10/2020 08:23    ____________________________________________  PROCEDURES   Procedure(s) performed (including Critical Care):  Procedures  ____________________________________________  INITIAL IMPRESSION / MDM / ASSESSMENT AND PLAN / ED COURSE  As part of my medical decision making, I reviewed the following data within the electronic MEDICAL RECORD NUMBER Nursing notes reviewed and incorporated, Old chart reviewed, Notes from prior ED visits, and  Controlled Substance Database       *NAASIR CARREIRA was evaluated in Emergency Department on 10/10/2020 for the symptoms described in the history of present illness. He was evaluated in the context of the global COVID-19 pandemic, which necessitated consideration that the patient might be at risk for infection with the SARS-CoV-2 virus that causes COVID-19. Institutional protocols and algorithms that pertain to the evaluation of patients at risk for COVID-19 are in a state of rapid change based on information released by regulatory bodies including the CDC and federal and state organizations. These policies and algorithms were followed during the patient's care in the ED.  Some ED evaluations and interventions may be delayed as a result of limited staffing during the pandemic.*     Medical Decision Making: 60 year old male here with atypical, reproducible left upper chest wall pain.  Suspect this is related to  his prior sternotomy and increased movement recently.  EKG is nonischemic and troponins are negative x2 despite constant symptoms for greater than 6 hours, do not suspect ACS.  Discussed with Dr. Azucena Cecil who was in agreement.  Chest x-ray is clear without evidence of pneumonia or pneumothorax or other complications.  Lab work was otherwise unremarkable.  No clinical signs of DVT or  PE.  He is not hypoxic or tachypneic.  Will place on brief course of analgesics as needed, discharged with outpatient follow-up.  ____________________________________________  FINAL CLINICAL IMPRESSION(S) / ED DIAGNOSES  Final diagnoses:  Atypical chest pain     MEDICATIONS GIVEN DURING THIS VISIT:  Medications  HYDROcodone-acetaminophen (NORCO/VICODIN) 5-325 MG per tablet 2 tablet (2 tablets Oral Given 10/10/20 0901)     ED Discharge Orders         Ordered    HYDROcodone-acetaminophen (NORCO/VICODIN) 5-325 MG tablet  Every 6 hours PRN        10/10/20 1211           Note:  This document was prepared using Dragon voice recognition software and may include unintentional dictation errors.   Shaune Pollack, MD 10/10/20 [phone]

## 2020-10-10 NOTE — ED Triage Notes (Signed)
Pt c/o a "light" chest pain in the center of his chest with feeling of weakness and fatigue with SOB this morning while he was getting ready for work. States he took x1 nitro SL, states the pain has eased off.

## 2020-10-23 ENCOUNTER — Other Ambulatory Visit
Admission: RE | Admit: 2020-10-23 | Discharge: 2020-10-23 | Disposition: A | Discharge status: Home / Self Care | Payer: Self-pay | Source: Ambulatory Visit | Attending: Physician Assistant | Admitting: Physician Assistant

## 2020-10-23 DIAGNOSIS — E876 Hypokalemia: Secondary | ICD-10-CM | POA: Insufficient documentation

## 2020-10-23 DIAGNOSIS — I214 Non-ST elevation (NSTEMI) myocardial infarction: Secondary | ICD-10-CM | POA: Insufficient documentation

## 2020-10-23 LAB — CBC
HCT: 45.2 % (ref 39.0–52.0)
Hemoglobin: 15.1 g/dL (ref 13.0–17.0)
MCH: 29.2 pg (ref 26.0–34.0)
MCHC: 33.4 g/dL (ref 30.0–36.0)
MCV: 87.3 fL (ref 80.0–100.0)
Platelets: 258 10*3/uL (ref 150–400)
RBC: 5.18 MIL/uL (ref 4.22–5.81)
RDW: 13.8 % (ref 11.5–15.5)
WBC: 8.6 10*3/uL (ref 4.0–10.5)
nRBC: 0 % (ref 0.0–0.2)

## 2020-10-23 LAB — BASIC METABOLIC PANEL
Anion gap: 13 (ref 5–15)
BUN: 14 mg/dL (ref 6–20)
CO2: 20 mmol/L — ABNORMAL LOW (ref 22–32)
Calcium: 9 mg/dL (ref 8.9–10.3)
Chloride: 105 mmol/L (ref 98–111)
Creatinine, Ser: 1.02 mg/dL (ref 0.61–1.24)
GFR, Estimated: >60 mL/min (ref >60)
Glucose, Bld: 133 mg/dL — ABNORMAL HIGH (ref 70–99)
Potassium: 3.6 mmol/L (ref 3.5–5.1)
Sodium: 138 mmol/L (ref 135–145)

## 2020-10-24 ENCOUNTER — Telehealth: Payer: Self-pay

## 2020-10-24 NOTE — Telephone Encounter (Signed)
Able to reach pt regarding his recent lab work, Nobie Putnam, PA-C had a chance to review his results and advised   Potassium checked 1 week after repletion (as recommended) and back at goal (K was 3.3 pm 5/20, increased to 3.9 on 5/25). BMET was reordered again. Potassium low again. Please ensure no EtOH use or vomiting / diarrhea. Please ensure he is taking his potassium supplementation, given there were no medication changes since the recheck 1 week after repletion.  Mr. Tassinari reports "I do not drink alcohol", states he does take his potassium supplements, not current episodes of vomiting or diarrhea ("only one tiny diarrhea the other day"), reports only increase urination. Educated the pt that the increased urination could be from his torsemide 20 mg daily as it is a fluid pill to help remove excess fluid volume. Pt reports understanding.   Mr. Kapur reports left knee pain is "bothering me again" advised to take his Norco that is on file, reports "I ran out" advised to seek PCP for additional refills, in the meantime can take OTC Tylenol or Ibuprofen (both very sparingly given history of ETOH and risk for bleeding). Mr. Kamphaus again verbalized understanding.   At this time, all questions and concerns were address and no additional concerns at this time. Agreeable to plan, will call back for anything further.    Follow-up appt 6/20 at 3:30 with Nobie Putnam, PA-C

## 2020-11-05 ENCOUNTER — Ambulatory Visit: Payer: Self-pay | Admitting: Physician Assistant

## 2020-11-22 ENCOUNTER — Encounter: Payer: Self-pay | Admitting: Medical

## 2020-11-22 ENCOUNTER — Other Ambulatory Visit: Payer: Self-pay

## 2020-11-22 ENCOUNTER — Ambulatory Visit: Payer: Self-pay

## 2020-11-22 ENCOUNTER — Ambulatory Visit (INDEPENDENT_AMBULATORY_CARE_PROVIDER_SITE_OTHER): Payer: Self-pay | Admitting: Medical

## 2020-11-22 ENCOUNTER — Other Ambulatory Visit
Admission: RE | Admit: 2020-11-22 | Discharge: 2020-11-22 | Disposition: A | Discharge status: Home / Self Care | Payer: Self-pay | Attending: Medical | Admitting: Medical

## 2020-11-22 VITALS — BP 124/83 | HR 86 | Ht 63.0 in | Wt 188.4 lb

## 2020-11-22 DIAGNOSIS — R42 Dizziness and giddiness: Secondary | ICD-10-CM

## 2020-11-22 DIAGNOSIS — Z79899 Other long term (current) drug therapy: Secondary | ICD-10-CM | POA: Insufficient documentation

## 2020-11-22 DIAGNOSIS — R0609 Other forms of dyspnea: Secondary | ICD-10-CM

## 2020-11-22 DIAGNOSIS — I208 Other forms of angina pectoris: Secondary | ICD-10-CM

## 2020-11-22 DIAGNOSIS — I10 Essential (primary) hypertension: Secondary | ICD-10-CM

## 2020-11-22 DIAGNOSIS — R0600 Dyspnea, unspecified: Secondary | ICD-10-CM

## 2020-11-22 DIAGNOSIS — I2510 Atherosclerotic heart disease of native coronary artery without angina pectoris: Secondary | ICD-10-CM

## 2020-11-22 DIAGNOSIS — Z951 Presence of aortocoronary bypass graft: Secondary | ICD-10-CM

## 2020-11-22 DIAGNOSIS — Z720 Tobacco use: Secondary | ICD-10-CM

## 2020-11-22 LAB — CBC
HCT: 43.1 % (ref 39.0–52.0)
Hemoglobin: 14.5 g/dL (ref 13.0–17.0)
MCH: 29.4 pg (ref 26.0–34.0)
MCHC: 33.6 g/dL (ref 30.0–36.0)
MCV: 87.2 fL (ref 80.0–100.0)
Platelets: 269 10*3/uL (ref 150–400)
RBC: 4.94 MIL/uL (ref 4.22–5.81)
RDW: 13.5 % (ref 11.5–15.5)
WBC: 8.9 10*3/uL (ref 4.0–10.5)
nRBC: 0 % (ref 0.0–0.2)

## 2020-11-22 LAB — BASIC METABOLIC PANEL
Anion gap: 8 (ref 5–15)
BUN: 13 mg/dL (ref 8–23)
CO2: 32 mmol/L (ref 22–32)
Calcium: 8.7 mg/dL — ABNORMAL LOW (ref 8.9–10.3)
Chloride: 100 mmol/L (ref 98–111)
Creatinine, Ser: 1.16 mg/dL (ref 0.61–1.24)
GFR, Estimated: >60 mL/min (ref >60)
Glucose, Bld: 115 mg/dL — ABNORMAL HIGH (ref 70–99)
Potassium: 3.2 mmol/L — ABNORMAL LOW (ref 3.5–5.1)
Sodium: 140 mmol/L (ref 135–145)

## 2020-11-22 LAB — MAGNESIUM: Magnesium: 1.9 mg/dL (ref 1.7–2.4)

## 2020-11-22 LAB — TSH: TSH: 2.229 u[IU]/mL (ref 0.350–4.500)

## 2020-11-22 MED ORDER — POTASSIUM CHLORIDE CRYS ER 20 MEQ PO TBCR: 20.0000 meq | EXTENDED_RELEASE_TABLET | Freq: Every day | ORAL | 11 refills | Status: DC

## 2020-11-22 MED ORDER — METOPROLOL TARTRATE 25 MG PO TABS: 37.5000 mg | ORAL_TABLET | Freq: Two times a day (BID) | ORAL | 11 refills | Status: DC

## 2020-11-22 MED ORDER — ROSUVASTATIN CALCIUM 40 MG PO TABS: 40.0000 mg | ORAL_TABLET | Freq: Every day | ORAL | 3 refills | Status: DC

## 2020-11-22 NOTE — Progress Notes (Addendum)
Cardiology Office Note:    Date:  11/22/2020   ID:  Jeffery Fischerobert E Fujikawa, DOB January 20, 1961, MRN [id number]  PCP:  Pcp, No  [facility] Cardiologist:  Dr. Cathey EndowArida [facility] Electrophysiologist:  None   Referring MD: No ref. provider found   Chief Complaint: 1 month follow-up  History of Present Illness:    Jeffery Sweeney is a 60 y.o. male with a hx of self reported MI x3 (5 years prior) with subsequent PCI and CABGx3, HTN, h/o tobacco use, HLD who is being seen for follow-up.   Seen 07/14/20 for chest pain similar to prior MI. He underwent LHC that showed 3V CAD, LAD disease moderate with significant FFR/IFR ratio 0.74. Also noted severe stenosis in the bifurcating first diagnoal, second diag, and RCA. Lcx also with disease. Echo showed EF 55-60%, NR WMA, mild LVH. Recommendations [facility]. He underwent CABG x3 (LIMA to LAD, rSVG-PDA, SVG-ramus) 2/21. ABIs normal bilaterally.   Last seen 10/05/20 and was felt to be dry, lower torsemide dose was recommended.   Seen in the ED 10/10/20 for chest pain, felt to be atypical. Troponin negative x2 and EKG without changes. CXR and labs unremarkable. Cardiologist was consulted, low suspicion for ACS, and he was discharged home.   Today, the patient reports shortness of breath and chest pain. SOB started when it started getting hot outside, however feels breathing has been worse the last 3-4 days. Has dizziness with coughing fits. Chest pain started about 3 days ago after he fell down the stairs. He fell down the last three steps and landed on his arm. He didn't pass out or lose consciousness. He said he felt dizzy and lightheaded before tripping and missing a step. He didn't go to the doctor. Denies swelling on the feet. Reports good fluid intake.BP today 122/80. At home it's 130s/80s.   When asked about chest pain that brought him to the ER 5/25 he said it happened when he took the trash out and it was hot outside. Felt it was worse  when he walked. Pain went away when he came back inside. Also had sob. Pain was not similar to before CABG. He took a Nitro which seemed to help.   Past Medical History:  Diagnosis Date   Hypertension    Multiple vessel coronary artery disease    Myocardial infarct (HCC)    x 3; PCI with single stent placement for each (in [place])   NSTEMI (non-ST elevated myocardial infarction) (HCC)    LHC 2/18 with multivessel CAD and [facility]   Tobacco use    2 cigarettes daily    Past Surgical History:  Procedure Laterality Date   CORONARY ANGIOPLASTY WITH STENT PLACEMENT     in [place] in 2018-19   CORONARY ARTERY BYPASS GRAFT N/A 07/09/2020   Procedure: CORONARY ARTERY BYPASS GRAFTING (CABG) x THREE , USING LEFT INTERNAL MAMMARY ARTERY, AND RIGHT LEG GREATER SAPHENOUS VEIN HARVESTED ENDOSCOPICALLY;  Surgeon: Delight OvensGerhardt, Edward B, MD;  [facility];  Service: Open Heart Surgery;  Laterality: N/A;   INTRAVASCULAR PRESSURE WIRE/FFR STUDY N/A 07/06/2020   Procedure: INTRAVASCULAR PRESSURE WIRE/FFR STUDY;  Surgeon: Iran OuchArida, Muhammad A, MD;  Location: [facility] INVASIVE CV LAB;  Service: Cardiovascular;  Laterality: N/A;  LAD   LEFT HEART CATH AND CORONARY ANGIOGRAPHY N/A 07/06/2020   Procedure: LEFT HEART CATH AND CORONARY ANGIOGRAPHY;  Surgeon: Iran OuchArida, Muhammad A, MD;  Location: [facility] INVASIVE CV LAB;  Service: Cardiovascular;  Laterality: N/A;   TEE WITHOUT CARDIOVERSION N/A 07/09/2020   Procedure: TRANSESOPHAGEAL ECHOCARDIOGRAM (TEE);  Surgeon: Delight Ovens, MD;  Location: [facility] OR;  Service: Open Heart Surgery;  Laterality: N/A;   WRIST SURGERY Left    for cyst    Current Medications: Current Meds  Medication Sig   aspirin EC 81 MG tablet Take 81 mg by mouth daily. Swallow whole.   HYDROcodone-acetaminophen (NORCO/VICODIN) 5-325 MG tablet Take 1 tablet by mouth every 6 (six) hours as needed for severe pain.   ibuprofen (ADVIL) 200 MG tablet Take 200 mg by mouth every 6  (six) hours as needed.   nitroGLYCERIN (NITROSTAT) 0.4 MG SL tablet Place 1 tablet (0.4 mg total) under the tongue every 5 (five) minutes as needed for chest pain.   pantoprazole (PROTONIX) 40 MG tablet Take 1 tablet (40 mg total) by mouth daily.   rosuvastatin (CRESTOR) 40 MG tablet Take 1 tablet (40 mg total) by mouth daily.   torsemide (DEMADEX) 20 MG tablet Take 1 tablet (20 mg total) by mouth daily.   [DISCONTINUED] atorvastatin (LIPITOR) 80 MG tablet Take 1 tablet (80 mg total) by mouth daily.   [DISCONTINUED] metoprolol tartrate (LOPRESSOR) 25 MG tablet Take 1.5 tablets (37.5 mg total) by mouth 2 (two) times daily.   [DISCONTINUED] potassium chloride SA (KLOR-CON) 20 MEQ tablet Take 1 tablet (20 mEq total) by mouth daily.     Allergies:   Patient has no known allergies.   Social History   Socioeconomic History   Marital status: Divorced    Spouse name: Not on file   Number of children: Not on file   Years of education: Not on file   Highest education level: Not on file  Occupational History   Not on file  Tobacco Use   Smoking status: Former    Packs/day: 0.50    Pack years: 0.00    Types: Cigarettes    Quit date: 07/06/2020    Years since quitting: 0.3   Smokeless tobacco: Never  Vaping Use   Vaping Use: Never used  Substance and Sexual Activity   Alcohol use: Not Currently   Drug use: Not Currently   Sexual activity: Not on file  Other Topics Concern   Not on file  Social History Narrative   Not on file   Social Determinants of Health   Financial Resource Strain: Not on file  Food Insecurity: Not on file  Transportation Needs: Not on file  Physical Activity: Not on file  Stress: Not on file  Social Connections: Not on file     Family History: The patient's family history is not on file.  ROS:   Please see the history of present illness.     All other systems reviewed and are negative.  EKGs/Labs/Other Studies Reviewed:    The following studies were  reviewed today: PostOp Echo 07/09/20 Left Ventricle: The left ventricle is unchanged from pre-bypass.  - Right Ventricle: The right ventricle appears unchanged from pre-bypass.  - Aorta: The aorta appears unchanged from pre-bypass.  - Left Atrium: The left atrium appears unchanged from pre-bypass.  - Left Atrial Appendage: The left atrial appendage appears unchanged from  pre-bypass.  - Aortic Valve: The aortic valve appears unchanged from pre-bypass.  - Mitral Valve: The mitral valve appears unchanged from pre-bypass.  - Tricuspid Valve: The tricuspid valve appears unchanged from pre-bypass.  - Interatrial Septum: The interatrial septum appears unchanged from  pre-bypass.  - Interventricular Septum: The interventricular septum appears  unchanged  from  pre-bypass.  - Pericardium: The pericardium appears unchanged from pre-bypass.   Vas [place] PreCABG 07/09/20 Summary:  Right Carotid: The extracranial vessels were near-normal with only minimal  wall thickening or plaque.  Left Carotid: The extracranial vessels were near-normal with only minimal  wall                thickening or plaque.  Vertebrals: Bilateral vertebral arteries demonstrate antegrade flow.  Right ABI: Resting right ankle-brachial index is within normal range. No  evidence of significant right lower extremity arterial disease.  Left ABI: Resting left ankle-brachial index is within normal range. No  evidence of significant left lower extremity arterial disease.  Right Upper Extremity: Doppler waveforms remain within normal limits with  right radial compression. Doppler waveforms decrease >50% with right ulnar  compression.  Left Upper Extremity: Doppler waveforms decrease >50% with left radial  compression. Doppler waveforms decrease >50% with left ulnar compression.   Echo 07/06/20  1. Left ventricular ejection fraction, by estimation, is 55 to 60%. The  left ventricle has normal function. The left ventricle has no  regional  wall motion abnormalities. There is mild left ventricular hypertrophy.  Left ventricular diastolic parameters  were normal. The average left ventricular global longitudinal strain is  -15.2 %. The global longitudinal strain is abnormal.   2. Right ventricular systolic function is normal. The right ventricular  size is normal.   3. The mitral valve is normal in structure. No evidence of mitral valve  regurgitation. No evidence of mitral stenosis.   4. The aortic valve is normal in structure. Aortic valve regurgitation is  not visualized. No aortic stenosis is present.   5. The inferior vena cava is normal in size with greater than 50%  respiratory variability, suggesting right atrial pressure of 3 mmHg.   LHC 07/06/2020 The left ventricular systolic function is normal. LV end diastolic pressure is normal. The left ventricular ejection fraction is 55-65% by visual estimate. Prox RCA lesion is 80% stenosed. Dist RCA lesion is 30% stenosed. RPDA lesion is 90% stenosed. Prox LAD to Mid LAD lesion is 60% stenosed. 1st Diag lesion is 90% stenosed. Lat 1st Diag lesion is 90% stenosed. Mid Cx to Dist Cx lesion is 90% stenosed. 2nd Diag lesion is 80% stenosed. 2nd Mrg lesion is 90% stenosed. 1.  Significant three-vessel coronary artery disease.  The LAD disease appears moderate angiographically.  However, it is diffusely diseased in the proximal mid segment and was highly significant by fractional flow reserve evaluation with an IFR ratio of 0.74.  In addition, there is severe stenosis in the bifurcating first diagonal, second diagonal and right coronary artery.  The left circumflex is also significantly diseased in the mid to distal segment but OM branches are small and likely not graftable. 2.  Normal LV systolic function and normal left ventricular end-diastolic pressure. Recommendations: I recommend transfer to [facility] for CABG evaluation. Resume heparin drip 6 hours  after sheath pull.  EKG:  EKG is ordered today.  The ekg ordered today demonstrates NSR, 84bpm, nonspecific T wave changes, no new ischemic changes  Recent Labs: 08/03/2020: ALT 18; B Natriuretic Peptide 66.5 10/05/2020: Magnesium 1.9 10/23/2020: BUN 14; Creatinine, Ser 1.02; Potassium 3.6; Sodium 138 11/22/2020: Hemoglobin 14.5; Platelets 269  Recent Lipid Panel    Component Value Date/Time   CHOL 135 08/03/2020 1545   TRIG 170 (H) 08/03/2020 1545   HDL 38 (L) 08/03/2020 1545   CHOLHDL 3.6 08/03/2020 1545  VLDL 34 08/03/2020 1545   LDLCALC 63 08/03/2020 1545     Physical Exam:    VS:  BP 124/83 (BP Location: Left Arm, Patient Position: Standing)   Pulse 86   Ht 5\' 3"  (1.6 m)   Wt 188 lb 6 oz (85.4 kg)   SpO2 98%   BMI 33.37 kg/m     Wt Readings from Last 3 Encounters:  11/22/20 188 lb 6 oz (85.4 kg)  10/10/20 184 lb (83.5 kg)  10/05/20 184 lb (83.5 kg)     GEN:  Well nourished, well developed in no acute distress HEENT: Normal NECK: No JVD; No carotid bruits LYMPHATICS: No lymphadenopathy CARDIAC: RRR, no murmurs, rubs, gallops RESPIRATORY:  Clear to auscultation without rales, wheezing or rhonchi  ABDOMEN: Soft, non-tender, non-distended MUSCULOSKELETAL:  No edema; No deformity  SKIN: Warm and dry NEUROLOGIC:  Alert and oriented x 3 PSYCHIATRIC:  Normal affect   ASSESSMENT:    1. CAD, multiple vessel   2. Stable angina pectoris (HCC)   3. S/P CABG x 3   4. Tobacco abuse   5. Dyspnea on exertion   6. Medication management   7. Dizziness   8. Primary hypertension    PLAN:    In order of problems listed above:  Chest pain CAD s/p CABGx3 with prior stenting Patient went to the ED 5/25 for chest pain improved with Nitro. Work-up was normal (negative trop and unremarkable EKG) and he was discharged home. He had a fall 3 days ago and reports MSK chest pain from that. Today he reports chest wall pain as well as some chest tightness. EKG today is unremarkable.  Seems most of his chest pain is atypical. He is TTP on exam. Overall low suspicion for ACS. Given prior CAD history I will order Myovew Lexiscan. He is not interested in Cardiac rehab. Continue Aspirin, BB, statin. If chest pain continues may need LHC. Weary of starting long-acting nitrate due to occasional orthostatic symptoms.  HFpEF Reports some sob on exertion, worse over the last few days, seems to be exacerbated by the heat. He appears euvolemic on exam. Lungs clear, no LLE, and no JVD appreciated. Prior echo showed low normal EF and G1DD. I will get myoview as above. RedsVest unable to function. Continue current Torsemide dose for now, Torsemide 20mg  daily. CHF education discussed. Will reevaluate at follow-up, may need repeat echo.   HTN BP good today. At home 130s/80s. Reports some orthostatic symptoms. Orthostatics negative today. I recommended good fluid intake and slowly changing positions. Continue current dose of metoprolol for now. If this persists despite lifestyle changes, consider decreasing dose.   HLD Having trouble affording Lipitor. We will try Crestor 40mg  daily. LDL 63 07/2020.  Fall Lightheadedness and dizziness Orthostatic symptoms Reported fall preceded by lightheadedness and dizziness when walking down the stairs, also missed a step so seems to be a mechanical component as well. No syncope/LOC and he did not hit his head. No other injuries. Orthostatics negative as above.I will check Labs: THS, BMET, Mag, CBC. Also check 2 week heart monitor, althoug low suspicion of arrythmia.    Disposition: Follow up in 6-8 week(s) with MD/APP   Shared Decision Making/Informed Consent   Shared Decision Making/Informed Consent The risks [chest pain, shortness of breath, cardiac arrhythmias, dizziness, blood pressure fluctuations, myocardial infarction, stroke/transient ischemic attack, nausea, vomiting, allergic reaction, radiation exposure, metallic taste sensation and  life-threatening complications (estimated to be 1 in 10,000)], benefits (risk stratification, diagnosing coronary artery disease,  treatment guidance) and alternatives of a nuclear stress test were discussed in detail with Mr. Schweer and he agrees to proceed.    Signed, Maryela Tapper David Stall, PA-C  11/22/2020 11:59 AM    [facility]

## 2020-11-22 NOTE — Patient Instructions (Signed)
Medication Instructions:  STOP Lipitor  START Crestor 40 mg Daily  *If you need a refill on your cardiac medications before your next appointment, please call your pharmacy*   Lab Work: CBC, BMET, TSH, Magnesium level  Walk into medical mall at the check in desk, they will direct you to lab registration, hours for labs are Monday-Friday 07:00am-5:30pm (no appointment necessary) LABS WILL APPEAR ON MYCHART, ABNORMAL RESULTS WILL BE CALLED  Testing/Procedures: ZIO (heart monitor) wear for 2 weeks Lexiscan (stress test)   Follow-Up: At [facility], you and your health needs are our priority.  As part of our continuing mission to provide you with exceptional heart care, we have created designated Provider Care Teams.  These Care Teams include your primary Cardiologist (physician) and Advanced Practice Providers (APPs -  Physician Assistants and Nurse Practitioners) who all work together to provide you with the care you need, when you need it.  We recommend signing up for the patient portal called "MyChart".  Sign up information is provided on this After Visit Summary.  MyChart is used to connect with patients for Virtual Visits (Telemedicine).  Patients are able to view lab/test results, encounter notes, upcoming appointments, etc.  Non-urgent messages can be sent to your provider as well.   To learn more about what you can do with MyChart, go to [email or web address].    Your next appointment:   6-8 week(s)  The format for your next appointment:   In Person  Provider:   Lorine Bears, MD or Cadence Fransico Michael, PA-C   Other Instructions Heart monitor (Zio patch) for 2 weeks (14 days)   Your physician has recommended that you wear a Zio monitor. This monitor is a medical device that records the heart's electrical activity. Doctors most often use these monitors to diagnose arrhythmias. Arrhythmias are problems with the speed or rhythm of the heartbeat. The monitor is a small device  applied to your chest. You can wear one while you do your normal daily activities. While wearing this monitor if you have any symptoms to push the button and record what you felt. Once you have worn this monitor for the period of time provider prescribed (Usually 14 days), you will return the monitor device in the postage paid box. Once it is returned they will download the data collected and provide [place] with a report which the provider will then review and we will call you with those results. Important tips:  Avoid showering during the first 24 hours of wearing the monitor. Avoid excessive sweating to help maximize wear time. Do not submerge the device, no hot tubs, and no swimming pools. Keep any lotions or oils away from the patch. After 24 hours you may shower with the patch on. Take brief showers with your back facing the shower head.  Do not remove patch once it has been placed because that will interrupt data and decrease adhesive wear time. Push the button when you have any symptoms and write down what you were feeling. Once you have completed wearing your monitor, remove and place into box which has postage paid and place in your outgoing mailbox.  If for some reason you have misplaced your box then call our office and we can provide another box and/or mail it off for you.    [facility] MYOVIEW Chartered loss adjuster)  Your caregiver has ordered a Stress Test with nuclear imaging. The purpose of this test is to evaluate the blood supply to your heart muscle. This procedure is referred to  as a "Non-Invasive Stress Test." This is because other than having an IV started in your vein, nothing is inserted or "invades" your body. Cardiac stress tests are done to find areas of poor blood flow to the heart by determining the extent of coronary artery disease (CAD). Some patients exercise on a treadmill, which naturally increases the blood flow to your heart, while others who are  unable to walk on a treadmill due to  physical limitations have a pharmacologic/chemical stress agent called Lexiscan . This medicine will mimic walking on a treadmill by temporarily increasing your coronary blood flow.   Please note: these test may take anywhere between 2-4 hours to complete  PLEASE REPORT TO [facility] MEDICAL MALL ENTRANCE  THE VOLUNTEERS AT THE FIRST DESK WILL DIRECT YOU WHERE TO GO  Instructions regarding medication:   ____ : Hold diabetes medication morning of procedure None  _X___:  Hold betablocker(s) night before procedure and morning of procedure Metoprolol   _X___:  Hold other medications as follows: Nitro  How to prepare for your Myoview test:  Do not eat or drink after midnight No caffeine for 24 hours prior to test No smoking 24 hours prior to test. ALL your medication may be taken with a few sips of water.   (Except for the meds mention above) Please wear a short sleeve shirt. Comfortable pants are appropriate. No perfume, cologne or lotion.   PLEASE NOTIFY THE OFFICE AT LEAST 24 HOURS IN ADVANCE IF YOU ARE UNABLE TO KEEP YOUR APPOINTMENT.  [phone] AND  PLEASE NOTIFY NUCLEAR MEDICINE AT [facility] AT LEAST 24 HOURS IN ADVANCE IF YOU ARE UNABLE TO KEEP YOUR APPOINTMENT. [phone]

## 2020-11-23 ENCOUNTER — Telehealth: Payer: Self-pay | Admitting: Medical

## 2020-11-23 DIAGNOSIS — E876 Hypokalemia: Secondary | ICD-10-CM

## 2020-11-23 NOTE — Telephone Encounter (Signed)
Patient made aware of lab results and Cadence Furth, [name] recommendation. Patient confirms that he is taking his Potassium 20 meq daily along with Torsemide 20mg  daily.  Pt sts that he is down to his last tablet of Potassium. He has been unable to pick up his refill of Potassium and Rosuvastatin due to the cost. He pays for his medication out of pocket and it cost him $54 a month for both medications.  Adv the patient that I will call his pharmacy to confirm the cost and inquiry if they accept Good Rx. Spoke with pharmacy at .Potassium $21.13 #30 and Rosuvastatin $42 #30 cash price. Confirmed that the will accept Good Rx coupon, with Good Rx Rosuvastatin $7.69-$14.19 #30 and Potassium $9.70 #30.  Spoke with the patient and made him aware. He sts that he could afford those prices. He has a smart phone and he will pull the coupons up and take it to his pharmacy.  Patient  adv of CF recommendation. He is to take 40 meq of Potassium today and tomorrow and then resume his normal dosage. Lab appt scheduled for 1 wk repeat bmp to be drawn at our office. Patient verbalized understanding and voiced appreciation for the call.

## 2020-11-30 ENCOUNTER — Ambulatory Visit: Payer: Self-pay

## 2020-12-07 ENCOUNTER — Other Ambulatory Visit: Payer: Self-pay

## 2020-12-07 ENCOUNTER — Encounter
Admission: RE | Admit: 2020-12-07 | Discharge: 2020-12-07 | Disposition: A | Discharge status: Home / Self Care | Payer: Self-pay | Source: Ambulatory Visit | Attending: Medical | Admitting: Medical

## 2020-12-07 DIAGNOSIS — R0609 Other forms of dyspnea: Secondary | ICD-10-CM

## 2020-12-07 DIAGNOSIS — I208 Other forms of angina pectoris: Secondary | ICD-10-CM

## 2020-12-07 DIAGNOSIS — I2510 Atherosclerotic heart disease of native coronary artery without angina pectoris: Secondary | ICD-10-CM | POA: Insufficient documentation

## 2020-12-07 DIAGNOSIS — R0600 Dyspnea, unspecified: Secondary | ICD-10-CM

## 2020-12-07 DIAGNOSIS — Z720 Tobacco use: Secondary | ICD-10-CM | POA: Insufficient documentation

## 2020-12-07 DIAGNOSIS — R42 Dizziness and giddiness: Secondary | ICD-10-CM | POA: Insufficient documentation

## 2020-12-07 LAB — NM MYOCAR MULTI W/SPECT W/WALL MOTION / EF
LV dias vol: 66 mL (ref 62–150)
LV sys vol: 19 mL
MPHR: 159 {beats}/min
Peak HR: 97 {beats}/min
Percent HR: 61 %
Rest HR: 66 {beats}/min
SDS: 0
SRS: 0
SSS: 0
TID: 0.98

## 2020-12-07 MED ORDER — TECHNETIUM TC 99M TETROFOSMIN IV KIT
30.0000 | PACK | Freq: Once | INTRAVENOUS | Status: AC | PRN
  Administered 2020-12-07: 31.84 via INTRAVENOUS

## 2020-12-07 MED ORDER — REGADENOSON 0.4 MG/5ML IV SOLN
0.4000 mg | Freq: Once | INTRAVENOUS | Status: AC
  Administered 2020-12-07: 0.4 mg via INTRAVENOUS

## 2020-12-07 MED ORDER — TECHNETIUM TC 99M TETROFOSMIN IV KIT
10.0000 | PACK | Freq: Once | INTRAVENOUS | Status: AC | PRN
  Administered 2020-12-07: 10.34 via INTRAVENOUS

## 2020-12-16 ENCOUNTER — Emergency Department
Admission: EM | Admit: 2020-12-16 | Discharge: 2020-12-16 | Disposition: A | Discharge status: Home / Self Care | Payer: Worker's Compensation | Attending: Emergency Medicine | Admitting: Emergency Medicine

## 2020-12-16 ENCOUNTER — Encounter: Payer: Self-pay | Admitting: Emergency Medicine

## 2020-12-16 ENCOUNTER — Other Ambulatory Visit: Payer: Self-pay

## 2020-12-16 DIAGNOSIS — Z87891 Personal history of nicotine dependence: Secondary | ICD-10-CM | POA: Diagnosis not present

## 2020-12-16 DIAGNOSIS — Z23 Encounter for immunization: Secondary | ICD-10-CM | POA: Insufficient documentation

## 2020-12-16 DIAGNOSIS — I10 Essential (primary) hypertension: Secondary | ICD-10-CM | POA: Insufficient documentation

## 2020-12-16 DIAGNOSIS — I25110 Atherosclerotic heart disease of native coronary artery with unstable angina pectoris: Secondary | ICD-10-CM | POA: Insufficient documentation

## 2020-12-16 DIAGNOSIS — Z79899 Other long term (current) drug therapy: Secondary | ICD-10-CM | POA: Diagnosis not present

## 2020-12-16 DIAGNOSIS — Z7982 Long term (current) use of aspirin: Secondary | ICD-10-CM | POA: Diagnosis not present

## 2020-12-16 DIAGNOSIS — X19XXXA Contact with other heat and hot substances, initial encounter: Secondary | ICD-10-CM | POA: Insufficient documentation

## 2020-12-16 DIAGNOSIS — T23101A Burn of first degree of right hand, unspecified site, initial encounter: Secondary | ICD-10-CM | POA: Diagnosis present

## 2020-12-16 DIAGNOSIS — Z951 Presence of aortocoronary bypass graft: Secondary | ICD-10-CM | POA: Insufficient documentation

## 2020-12-16 MED ORDER — SILVER SULFADIAZINE 1 % EX CREA
TOPICAL_CREAM | Freq: Once | CUTANEOUS | Status: AC
  Filled 2020-12-16: qty 85

## 2020-12-16 MED ORDER — TETANUS-DIPHTH-ACELL PERTUSSIS 5-2.5-18.5 LF-MCG/0.5 IM SUSY
0.5000 mL | PREFILLED_SYRINGE | Freq: Once | INTRAMUSCULAR | Status: AC
  Administered 2020-12-16: 0.5 mL via INTRAMUSCULAR
  Filled 2020-12-16: qty 0.5

## 2020-12-16 MED ORDER — SILVER SULFADIAZINE 1 % EX CREA: TOPICAL_CREAM | CUTANEOUS | 1 refills | Status: DC

## 2020-12-16 NOTE — ED Triage Notes (Signed)
Pt to ED via POV from work. Pt works at [employer], pt states that he was carrying a pan of grease and him and another employee ran into each other. Pt states that the grease spilled all over his right hand. Pt has redness on hand and forearm. Pt states that his right hand is his dominate hand. Pt is in NAD.

## 2020-12-16 NOTE — ED Notes (Signed)
Workers comp completed by Industrial/product designer. Walked specimen down to lab at 13

## 2020-12-16 NOTE — ED Provider Notes (Signed)
[facility] Emergency Department Provider Note  ____________________________________________  Time seen: Approximately 1:06 PM  I have reviewed the triage vital signs and the nursing notes.   HISTORY  Chief Complaint Hand Burn   HPI Jeffery Sweeney is a 60 y.o. male with history as listed below presents to the emergency department for treatment and evaluation of burn to the right hand. While at work [employer]) he was carrying a pan of hot grease, turned around, and collided with another worker. Grease sloshed out of the pan onto his right hand. He has burns to the right thumb, between the index and middle finger, and wrist. He rinsed his hand under cold water immediately afterward.    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   Chest pain 07/06/2020   CAD, multiple vessel 07/06/2020   3-vessel CAD 07/06/2020   Hypertension    Tobacco abuse    Non-ST elevation (NSTEMI) myocardial infarction [facility])    Unstable angina (HCC) 07/05/2020    Past Surgical History:  Procedure Laterality Date   CORONARY ANGIOPLASTY WITH STENT PLACEMENT     in [place] in 2018-19   CORONARY ARTERY BYPASS GRAFT N/A 07/09/2020   Procedure: CORONARY ARTERY BYPASS GRAFTING (CABG) x THREE , USING LEFT INTERNAL MAMMARY ARTERY, AND RIGHT LEG GREATER SAPHENOUS VEIN HARVESTED ENDOSCOPICALLY;  Surgeon: Delight Ovens, MD;  Location: [facility] OR;  Service: Open Heart Surgery;  Laterality: N/A;   INTRAVASCULAR PRESSURE WIRE/FFR STUDY N/A 07/06/2020   Procedure: INTRAVASCULAR PRESSURE WIRE/FFR STUDY;  Surgeon:  Iran Ouch, MD;  Location: [facility] INVASIVE CV LAB;  Service: Cardiovascular;  Laterality: N/A;  LAD   LEFT HEART CATH AND CORONARY ANGIOGRAPHY N/A 07/06/2020   Procedure: LEFT HEART CATH AND CORONARY ANGIOGRAPHY;  Surgeon: Iran Ouch, MD;  Location: [facility] INVASIVE CV LAB;  Service: Cardiovascular;  Laterality: N/A;   TEE WITHOUT CARDIOVERSION N/A 07/09/2020   Procedure: TRANSESOPHAGEAL ECHOCARDIOGRAM (TEE);  Surgeon: Delight Ovens, MD;  Location: [facility] OR;  Service: Open Heart Surgery;  Laterality: N/A;   WRIST SURGERY Left    for cyst    Prior to Admission medications   Medication Sig Start Date End Date Taking? Authorizing Provider  silver sulfADIAZINE (SILVADENE) 1 % cream Apply to affected area daily 12/16/20 12/16/21 Yes Laithan Conchas B, FNP  aspirin EC 81 MG tablet Take 81 mg by mouth daily. Swallow whole.    [provider]  HYDROcodone-acetaminophen (NORCO/VICODIN) 5-325 MG tablet Take 1 tablet by mouth every 6 (six) hours as needed for severe pain. 10/10/20 10/10/21  Shaune Pollack, MD  ibuprofen (ADVIL) 200 MG tablet Take 200 mg by mouth every 6 (six) hours as needed.    [provider]  metoprolol tartrate (LOPRESSOR) 25 MG tablet Take 1.5 tablets (37.5 mg total) by mouth 2 (two) times daily. 11/22/20   Furth, Cadence H, PA-C  nitroGLYCERIN (NITROSTAT) 0.4 MG SL tablet Place 1 tablet (0.4 mg total) under the tongue every 5 (five) minutes as needed for chest pain. 10/05/20  Marisue Ivan D, PA-C  pantoprazole (PROTONIX) 40 MG tablet Take 1 tablet (40 mg total) by mouth daily. 09/20/20   Marisue Ivan D, PA-C  potassium chloride SA (KLOR-CON) 20 MEQ tablet Take 1 tablet (20 mEq total) by mouth daily. 11/22/20   Furth, Cadence H, PA-C  rosuvastatin (CRESTOR) 40 MG tablet Take 1 tablet (40 mg total) by mouth daily. 11/22/20 02/20/21  Furth, Cadence H, PA-C  torsemide (DEMADEX) 20 MG tablet Take 1 tablet (20 mg total) by mouth daily. 10/05/20 04/03/21  Marisue Ivan D, PA-C    Allergies Patient has no known allergies.  No family history on file.  Social History Social History   Tobacco Use   Smoking status: Former    Packs/day: 0.50    Types: Cigarettes    Quit date: 07/06/2020    Years since quitting: 0.4   Smokeless tobacco: Never  Vaping Use   Vaping Use: Never used  Substance Use Topics   Alcohol use: Not Currently   Drug use: Not Currently    Review of Systems  Constitutional: Negative for fever. Respiratory: Negative for cough or shortness of breath.  Musculoskeletal: Negative for myalgias Skin: Positive for burns to the right hand. Neurological: Negative for numbness or paresthesias. ____________________________________________   PHYSICAL EXAM:  VITAL SIGNS: ED Triage Vitals [12/16/20 1251]  Enc Vitals Group     BP 127/86     Pulse Rate 91     Resp 16     Temp 98.4 F (36.9 C)     Temp Source Oral     SpO2 95 %     Weight 186 lb (84.4 kg)     Height 5\' 3"  (1.6 m)     Head Circumference      Peak Flow      Pain Score 8     Pain Loc      Pain Edu?      Excl. in GC?      Constitutional: Overall well appearing. Eyes: Conjunctivae are clear without discharge or drainage. Nose: No rhinorrhea noted. Mouth/Throat: Airway is patent.  Neck: No stridor. Unrestricted range of motion observed. Cardiovascular: Capillary refill is <3 seconds.  Respiratory: Respirations are even and unlabored.. Musculoskeletal: Unrestricted range of motion observed. Neurologic: Awake, alert, and oriented x 4.  Skin:  No circumferential burn to any finger of the right hand. First degree burn noted over the base of the dorsal aspect of the right thumb, web space of the index and middle fingers, and scattered area of right wrist.   ____________________________________________   LABS (all labs ordered are listed, but only abnormal results are displayed)  Labs Reviewed - No data to  display ____________________________________________  EKG  Not indicated. ____________________________________________  RADIOLOGY  Not indicated. ____________________________________________   PROCEDURES  Procedures ____________________________________________   INITIAL IMPRESSION / ASSESSMENT AND PLAN / ED COURSE  Jeffery Sweeney is a 60 y.o. male presents to the ER after burn to dominant hand while at work. See HPI. Exam is reassuring. No circumferential burns. Pain well controlled. Patient has full ROM of fingers of the right hand including thumb. Tdap updated. Silvadene and antibiotic ointment wound care treatment discussed with the patient. Outpatient follow up with the [facility] recommended and contact information will be provided on discharge paperwork. He was encouraged to see primary care or return to the ER for concerns.    Medications  Tdap (BOOSTRIX) injection 0.5 mL (has no administration in time range)  silver sulfADIAZINE (  SILVADENE) 1 % cream (has no administration in time range)     Pertinent labs & imaging results that were available during my care of the patient were reviewed by me and considered in my medical decision making (see chart for details).  ____________________________________________   FINAL CLINICAL IMPRESSION(S) / ED DIAGNOSES  Final diagnoses:  Burn, hand, first degree, right, initial encounter    ED Discharge Orders          Ordered    silver sulfADIAZINE (SILVADENE) 1 % cream        12/16/20 1423             Note:  This document was prepared using Dragon voice recognition software and may include unintentional dictation errors.   Chinita Pester, FNP 12/16/20 1434    Concha Se, MD 12/16/20 1455

## 2020-12-16 NOTE — Discharge Instructions (Addendum)
Please schedule a follow up appointment with the burn center. Call tomorrow to request an appointment.  Keep the wounds clean and dry. Apply the silvadene cream or antibiotic ointment to the burned areas daily.  Return to the ER for symptoms of concern if unable to see primary care or the burn specilaists.

## 2020-12-24 ENCOUNTER — Telehealth: Payer: Self-pay | Admitting: Cardiovascular Disease

## 2020-12-24 NOTE — Telephone Encounter (Signed)
Left voicemail message to call back for review of his medications and cost.  Rosuvastatin $7.48 [facility] Torsemide $3.37 [facility] Metoprolol $9.47 [facility] Potassium $5.18 [facility]

## 2020-12-24 NOTE — Telephone Encounter (Signed)
Patient has been out of work States that he is needing refills for most all medications but he does not think he can afford them Please call to discuss options to help

## 2020-12-25 ENCOUNTER — Other Ambulatory Visit: Payer: Self-pay

## 2020-12-25 MED ORDER — METOPROLOL TARTRATE 25 MG PO TABS
37.5000 mg | ORAL_TABLET | Freq: Two times a day (BID) | ORAL | 2 refills | Status: DC
  Filled 2020-12-25: qty 90, 30d supply, fill #0
  Filled 2021-01-31: qty 42, 14d supply, fill #1

## 2020-12-25 MED ORDER — POTASSIUM CHLORIDE CRYS ER 20 MEQ PO TBCR
20.0000 meq | EXTENDED_RELEASE_TABLET | Freq: Every day | ORAL | 2 refills | Status: DC
  Filled 2020-12-25: qty 30, 30d supply, fill #0

## 2020-12-25 MED ORDER — ROSUVASTATIN CALCIUM 40 MG PO TABS
40.0000 mg | ORAL_TABLET | Freq: Every day | ORAL | 2 refills | Status: DC
  Filled 2020-12-25: qty 30, 30d supply, fill #0
  Filled 2021-01-31: qty 14, 14d supply, fill #1

## 2020-12-25 MED ORDER — TORSEMIDE 20 MG PO TABS
20.0000 mg | ORAL_TABLET | Freq: Every day | ORAL | 2 refills | Status: DC
  Filled 2020-12-25: qty 30, 30d supply, fill #0

## 2020-12-25 NOTE — Telephone Encounter (Addendum)
Spoke with the patient. Patient given the prices listed in the message below from [facility] for his current prescription medications. He was previously given this info before.  Patient sts that he is unemployed and cannot afford the prices listed below fors his medications. He does not have prescription coverage. He has been out of his cardiac medication for a week and 1/2. He reports increased LE edema. He denies chest pain, sob, orthopnea, pnd.  Patient is a resident of [place]. Adv him that we could send his prescriptions in to [facility]. Adv him that they will contact him to discuss enrollment and eligibility.  Patients most recent 11/22/20 bmp resulted a K+ of 3.3. He was to increase his potassium 40 meq x2 days and have repeat bmp in 1 week. He has not had his lab repeated (has been out of meds almost 2 weeks).  Adv the patient that I will fwd the update to the ordering provider Cadence Furth, PA and call back with her recommendation.

## 2020-12-25 NOTE — Telephone Encounter (Signed)
Patient returning call re meds and cost

## 2020-12-27 ENCOUNTER — Other Ambulatory Visit: Payer: Self-pay

## 2020-12-27 MED ORDER — PANTOPRAZOLE SODIUM 40 MG PO TBEC
40.0000 mg | DELAYED_RELEASE_TABLET | Freq: Every day | ORAL | 2 refills | Status: DC
  Filled 2020-12-27: qty 30, 30d supply, fill #0
  Filled 2021-01-31: qty 14, 14d supply, fill #1

## 2020-12-27 NOTE — Telephone Encounter (Addendum)
Spoke with the patient. Patient sts that he was able to pick up his medication from medication management today. A refill of protonix was not sent in, he is requesting a refill.  Adv the patient of Cadence Furth, [name] recommendation. He will resume his cardiac medications today. Patient has an appt scheduled to see Dr. Kirke Corin on 01/03/21 repeat bmp can be drawn at that visit.  Patient verbalized understanding and voiced appreciation for the assistance.

## 2020-12-27 NOTE — Telephone Encounter (Signed)
Returned the patients call. Lmtcb.   Furth, Cadence H, PA-C  You 2 days ago   As discussed face to face. Lets have patient restart medications and check BMET in a week.

## 2020-12-27 NOTE — Telephone Encounter (Signed)
Patient calling  Would like a medication clarification

## 2020-12-27 NOTE — Telephone Encounter (Signed)
*  STAT* If patient is at the pharmacy, call can be transferred to refill team.   1. Which medications need to be refilled? (please list name of each medication and dose if known) Protonix  2. Which pharmacy/location (including street and city if local pharmacy) is medication to be sent to? [facility] [address]  3. Do they need a 30 day or 90 day supply? 30

## 2021-01-03 ENCOUNTER — Other Ambulatory Visit: Payer: Self-pay

## 2021-01-03 ENCOUNTER — Encounter: Payer: Self-pay | Admitting: Cardiovascular Disease

## 2021-01-03 ENCOUNTER — Ambulatory Visit (INDEPENDENT_AMBULATORY_CARE_PROVIDER_SITE_OTHER): Payer: Self-pay | Admitting: Cardiovascular Disease

## 2021-01-03 VITALS — BP 120/82 | HR 86 | Ht 63.0 in | Wt 186.0 lb

## 2021-01-03 DIAGNOSIS — I10 Essential (primary) hypertension: Secondary | ICD-10-CM

## 2021-01-03 DIAGNOSIS — E785 Hyperlipidemia, unspecified: Secondary | ICD-10-CM

## 2021-01-03 DIAGNOSIS — I2510 Atherosclerotic heart disease of native coronary artery without angina pectoris: Secondary | ICD-10-CM

## 2021-01-03 NOTE — Patient Instructions (Signed)
Medication Instructions:  Your physician has recommended you make the following change in your medication:   1) STOP Potassium  2) STOP Torsemide  Continue your other medications as prescribed.   *If you need a refill on your cardiac medications before your next appointment, please call your pharmacy*   Lab Work: None ordered If you have labs (blood work) drawn today and your tests are completely normal, you will receive your results only by: MyChart Message (if you have MyChart) OR A paper copy in the mail If you have any lab test that is abnormal or we need to change your treatment, we will call you to review the results.   Testing/Procedures: None ordered   Follow-Up: At [facility], you and your health needs are our priority.  As part of our continuing mission to provide you with exceptional heart care, we have created designated Provider Care Teams.  These Care Teams include your primary Cardiologist (physician) and Advanced Practice Providers (APPs -  Physician Assistants and Nurse Practitioners) who all work together to provide you with the care you need, when you need it.  We recommend signing up for the patient portal called "MyChart".  Sign up information is provided on this After Visit Summary.  MyChart is used to connect with patients for Virtual Visits (Telemedicine).  Patients are able to view lab/test results, encounter notes, upcoming appointments, etc.  Non-urgent messages can be sent to your provider as well.   To learn more about what you can do with MyChart, go to [email or web address].    Your next appointment:   4 month(s)  The format for your next appointment:   In Person  Provider:   You may see Lorine Bears, MD or one of the following Advanced Practice Providers on your designated Care Team:   Nicolasa Ducking, NP Eula Listen, PA-C Marisue Ivan, PA-C Cadence Fransico Michael, [place]   Other Instructions N/A

## 2021-01-03 NOTE — Progress Notes (Signed)
Cardiology Office Note   Date:  01/03/2021   ID:  Jeffery Sweeney, DOB [birth year], MRN [id number]  PCP:  System, Provider Not In  Cardiologist:   Lorine Bears, MD   Chief Complaint  Patient presents with   Other    6-8 wk f/u no complaints today. Meds reviewed verbally with pt.      History of Present Illness: Jeffery Sweeney is a 60 y.o. male who presents for a follow-up visit regarding coronary artery disease.  He has known history of essential hypertension, tobacco use and hyperlipidemia. The patient had previous PCI in [place] in 2019.  He presented in February of this year with unstable angina.  Cardiac catheterization showed significant three-vessel coronary artery disease.  Echo showed normal LV systolic function.  The patient underwent CABG x3 with LIMA to LAD, SVG to right PDA and SVG to ramus. The patient was seen in our office last month with atypical chest pain.  EKG showed no ischemic changes.  He underwent a Lexiscan Myoview which showed no evidence of ischemia with normal ejection fraction.  He fell coming down the stairs as he slept.  He denies loss of consciousness or dizziness.  ZIO monitor was placed but he has not returned it.    Past Medical History:  Diagnosis Date   Hypertension    Multiple vessel coronary artery disease    Myocardial infarct (HCC)    x 3; PCI with single stent placement for each (in [place])   NSTEMI (non-ST elevated myocardial infarction) (HCC)    LHC 2/18 with multivessel CAD and transfer to [facility] for CABG eval   Tobacco use    2 cigarettes daily    Past Surgical History:  Procedure Laterality Date   CORONARY ANGIOPLASTY WITH STENT PLACEMENT     in [place] in 2018-19   CORONARY ARTERY BYPASS GRAFT N/A 07/09/2020   Procedure: CORONARY ARTERY BYPASS GRAFTING (CABG) x THREE , USING LEFT INTERNAL MAMMARY ARTERY, AND RIGHT LEG GREATER SAPHENOUS VEIN HARVESTED ENDOSCOPICALLY;  Surgeon: Delight Ovens, MD;  Location:  [facility] OR;  Service: Open Heart Surgery;  Laterality: N/A;   INTRAVASCULAR PRESSURE WIRE/FFR STUDY N/A 07/06/2020   Procedure: INTRAVASCULAR PRESSURE WIRE/FFR STUDY;  Surgeon: Iran Ouch, MD;  Location: [facility] INVASIVE CV LAB;  Service: Cardiovascular;  Laterality: N/A;  LAD   LEFT HEART CATH AND CORONARY ANGIOGRAPHY N/A 07/06/2020   Procedure: LEFT HEART CATH AND CORONARY ANGIOGRAPHY;  Surgeon: Iran Ouch, MD;  Location: [facility] INVASIVE CV LAB;  Service: Cardiovascular;  Laterality: N/A;   TEE WITHOUT CARDIOVERSION N/A 07/09/2020   Procedure: TRANSESOPHAGEAL ECHOCARDIOGRAM (TEE);  Surgeon: Delight Ovens, MD;  Location: [facility] OR;  Service: Open Heart Surgery;  Laterality: N/A;   WRIST SURGERY Left    for cyst     Current Outpatient Medications  Medication Sig Dispense Refill   aspirin EC 81 MG tablet Take 81 mg by mouth daily. Swallow whole.     HYDROcodone-acetaminophen (NORCO/VICODIN) 5-325 MG tablet Take 1 tablet by mouth every 6 (six) hours as needed for severe pain. 8 tablet 0   ibuprofen (ADVIL) 200 MG tablet Take 200 mg by mouth every 6 (six) hours as needed.     metoprolol tartrate (LOPRESSOR) 25 MG tablet Take 1.5 tablets (37.5 mg total) by mouth 2 (two) times daily. 90 tablet 2   nitroGLYCERIN (NITROSTAT) 0.4 MG SL tablet Place 1 tablet (0.4 mg total) under the tongue every 5 (five) minutes as needed for chest  pain. 25 tablet 3   pantoprazole (PROTONIX) 40 MG tablet Take 1 tablet (40 mg total) by mouth once daily. 30 tablet 2   potassium chloride SA (KLOR-CON) 20 MEQ tablet Take 1 tablet (20 mEq total) by mouth once daily. 30 tablet 2   rosuvastatin (CRESTOR) 40 MG tablet Take 1 tablet (40 mg total) by mouth once daily. 30 tablet 2   silver sulfADIAZINE (SILVADENE) 1 % cream Apply to affected area daily 50 g 1   torsemide (DEMADEX) 20 MG tablet Take 1 tablet (20 mg total) by mouth once daily. 30 tablet 2   No current facility-administered medications for this visit.     Allergies:   Patient has no known allergies.    Social History:  The patient  reports that he quit smoking about 5 months ago. His smoking use included cigarettes. He smoked an average of .5 packs per day. He has never used smokeless tobacco. He reports that he does not currently use alcohol. He reports that he does not currently use drugs.   Family History:  The patient's family history is not on file.    ROS:  Please see the history of present illness.   Otherwise, review of systems are positive for .   All other systems are reviewed and negative.    PHYSICAL EXAM: VS:  BP 120/82 (BP Location: Left Arm, Patient Position: Sitting, Cuff Size: Normal)   Pulse 86   Ht 5\' 3"  (1.6 m)   Wt 186 lb (84.4 kg)   SpO2 98%   BMI 32.95 kg/m  , BMI Body mass index is 32.95 kg/m. GEN: Well nourished, well developed, in no acute distress  HEENT: normal  Neck: no JVD, carotid bruits, or masses Cardiac: RRR; no murmurs, rubs, or gallops,no edema  Respiratory:  clear to auscultation bilaterally, normal work of breathing GI: soft, nontender, nondistended, + BS MS: no deformity or atrophy  Skin: warm and dry, no rash Neuro:  Strength and sensation are intact Psych: euthymic mood, full affect   EKG:  EKG is not ordered today.    Recent Labs: 08/03/2020: ALT 18; B Natriuretic Peptide 66.5 11/22/2020: BUN 13; Creatinine, Ser 1.16; Hemoglobin 14.5; Magnesium 1.9; Platelets 269; Potassium 3.2; Sodium 140; TSH 2.229    Lipid Panel    Component Value Date/Time   CHOL 135 08/03/2020 1545   TRIG 170 (H) 08/03/2020 1545   HDL 38 (L) 08/03/2020 1545   CHOLHDL 3.6 08/03/2020 1545   VLDL 34 08/03/2020 1545   LDLCALC 63 08/03/2020 1545      Wt Readings from Last 3 Encounters:  01/03/21 186 lb (84.4 kg)  12/16/20 186 lb (84.4 kg)  11/22/20 188 lb 6 oz (85.4 kg)       No flowsheet data found.    ASSESSMENT AND PLAN:  1.  Coronary artery disease involving native coronary arteries  without angina: He is doing well overall.  Recent nuclear stress test showed no evidence of ischemia with normal ejection fraction.  2.  Essential hypertension: Blood pressure is well controlled on current medications.  3.  Hyperlipidemia: Continue treatment with high-dose rosuvastatin.  I reviewed most recent lipid profile which showed an LDL of 63.  4.  Volume overload post CABG: Seems to have resolved completely.  I discontinued torsemide and potassium.    Disposition:   FU in 4 months  Signed,  01/23/21, MD  01/03/2021 10:55 AM    [facility]

## 2021-01-31 ENCOUNTER — Telehealth: Payer: Self-pay | Admitting: Physician Assistant

## 2021-01-31 ENCOUNTER — Other Ambulatory Visit: Payer: Self-pay

## 2021-01-31 DIAGNOSIS — R42 Dizziness and giddiness: Secondary | ICD-10-CM

## 2021-01-31 DIAGNOSIS — R110 Nausea: Secondary | ICD-10-CM

## 2021-01-31 DIAGNOSIS — R82998 Other abnormal findings in urine: Secondary | ICD-10-CM

## 2021-01-31 NOTE — Telephone Encounter (Signed)
Spoke with pt.  Pt reports yesterday at work felt light headed, HA, and "started shaking."  Pt went home and tried to eat something but still did not feel well.  Checked BP at home 173/106 and "noticed my urine is very dark."   Confirmed pt is taking meds per med list.  BP now while on the phone is 131/89 HR 85.  Pt still has HA, but denies dizziness or any other associated s/s.  Pt took Ibuprofen for HA.   Advised pt to make sure staying hydrated well.  Monitor BP and contact office for any recurrent s/s or concerns.  Pt voiced understanding and has no further questions.

## 2021-01-31 NOTE — Telephone Encounter (Signed)
Pt c/o BP issue: STAT if pt c/o blurred vision, one-sided weakness or slurred speech  1. What are your last 5 BP readings?  Yesterday 173/106 This morning 144/88  2. Are you having any other symptoms (ex. Dizziness, headache, blurred vision, passed out)? Lightheaded, nausea, headache, states yesterday his urine was dark  3. What is your BP issue? elevated

## 2021-02-03 NOTE — Telephone Encounter (Signed)
If he is still having symptoms recommend checking labs including CBC, CMP and CPK

## 2021-02-04 NOTE — Telephone Encounter (Signed)
Spoke with the patient. Patient made aware of Dr. Jari Sportsman response and recommendation.  Patient sts that he is feeling better but would like to proceed with the recommended lab work. [facility]  Lab orders placed. Adv the patient that we will f/u with results when available. Patient is to contact the office in the interim if cardiac symptoms develop. Patient verbalized understanding.

## 2021-02-05 ENCOUNTER — Other Ambulatory Visit
Admission: RE | Admit: 2021-02-05 | Discharge: 2021-02-05 | Disposition: A | Discharge status: Home / Self Care | Payer: Self-pay | Attending: Cardiovascular Disease | Admitting: Cardiovascular Disease

## 2021-02-05 DIAGNOSIS — R82998 Other abnormal findings in urine: Secondary | ICD-10-CM | POA: Insufficient documentation

## 2021-02-05 DIAGNOSIS — R110 Nausea: Secondary | ICD-10-CM | POA: Insufficient documentation

## 2021-02-05 DIAGNOSIS — R42 Dizziness and giddiness: Secondary | ICD-10-CM | POA: Insufficient documentation

## 2021-02-05 LAB — COMPREHENSIVE METABOLIC PANEL
ALT: 17 U/L (ref 0–44)
AST: 17 U/L (ref 15–41)
Albumin: 3.7 g/dL (ref 3.5–5.0)
Alkaline Phosphatase: 73 U/L (ref 38–126)
Anion gap: 7 (ref 5–15)
BUN: 13 mg/dL (ref 8–23)
CO2: 24 mmol/L (ref 22–32)
Calcium: 8.6 mg/dL — ABNORMAL LOW (ref 8.9–10.3)
Chloride: 106 mmol/L (ref 98–111)
Creatinine, Ser: 0.79 mg/dL (ref 0.61–1.24)
GFR, Estimated: >60 mL/min (ref >60)
Glucose, Bld: 112 mg/dL — ABNORMAL HIGH (ref 70–99)
Potassium: 3.7 mmol/L (ref 3.5–5.1)
Sodium: 137 mmol/L (ref 135–145)
Total Bilirubin: 1.4 mg/dL — ABNORMAL HIGH (ref 0.3–1.2)
Total Protein: 7.1 g/dL (ref 6.5–8.1)

## 2021-02-05 LAB — CBC WITH DIFFERENTIAL/PLATELET
Abs Immature Granulocytes: 0.02 10*3/uL (ref 0.00–0.07)
Basophils Absolute: 0 10*3/uL (ref 0.0–0.1)
Basophils Relative: 1 %
Eosinophils Absolute: 0.4 10*3/uL (ref 0.0–0.5)
Eosinophils Relative: 5 %
HCT: 44.5 % (ref 39.0–52.0)
Hemoglobin: 15 g/dL (ref 13.0–17.0)
Immature Granulocytes: 0 %
Lymphocytes Relative: 45 %
Lymphs Abs: 3.4 10*3/uL (ref 0.7–4.0)
MCH: 29.9 pg (ref 26.0–34.0)
MCHC: 33.7 g/dL (ref 30.0–36.0)
MCV: 88.6 fL (ref 80.0–100.0)
Monocytes Absolute: 0.6 10*3/uL (ref 0.1–1.0)
Monocytes Relative: 8 %
Neutro Abs: 3.1 10*3/uL (ref 1.7–7.7)
Neutrophils Relative %: 41 %
Platelets: 245 10*3/uL (ref 150–400)
RBC: 5.02 MIL/uL (ref 4.22–5.81)
RDW: 13.2 % (ref 11.5–15.5)
WBC: 7.6 10*3/uL (ref 4.0–10.5)
nRBC: 0 % (ref 0.0–0.2)

## 2021-02-05 LAB — CK: Total CK: 96 U/L (ref 49–397)

## 2021-02-20 ENCOUNTER — Other Ambulatory Visit: Payer: Self-pay | Admitting: *Deleted

## 2021-02-20 ENCOUNTER — Telehealth: Payer: Self-pay | Admitting: Cardiovascular Disease

## 2021-02-20 MED ORDER — ROSUVASTATIN CALCIUM 40 MG PO TABS: 40.0000 mg | ORAL_TABLET | Freq: Every day | ORAL | 2 refills | Status: DC

## 2021-02-20 MED ORDER — METOPROLOL TARTRATE 25 MG PO TABS: 37.5000 mg | ORAL_TABLET | Freq: Two times a day (BID) | ORAL | 2 refills | Status: DC

## 2021-02-20 MED ORDER — PANTOPRAZOLE SODIUM 40 MG PO TBEC
40.0000 mg | DELAYED_RELEASE_TABLET | Freq: Every day | ORAL | 2 refills | Status: DC
Start: 2021-02-20 — End: 2021-04-03

## 2021-02-20 NOTE — Telephone Encounter (Signed)
*  STAT* If patient is at the pharmacy, call can be transferred to refill team.   1. Which medications need to be refilled? (please list name of each medication and dose if known) Lopressor 35 mg, Crestor 40 mg, Protonix 40 mg  2. Which pharmacy/location (including street and city if local pharmacy) is medication to be sent to? [facility] [address]  3. Do they need a 30 day or 90 day supply? 30 day

## 2021-02-20 NOTE — Telephone Encounter (Signed)
All medications forwarded to [facility] on [address].

## 2021-04-03 ENCOUNTER — Other Ambulatory Visit: Payer: Self-pay

## 2021-04-03 MED ORDER — METOPROLOL TARTRATE 25 MG PO TABS: 37.5000 mg | ORAL_TABLET | Freq: Two times a day (BID) | ORAL | 0 refills | Status: DC

## 2021-04-03 MED ORDER — PANTOPRAZOLE SODIUM 40 MG PO TBEC: 40.0000 mg | DELAYED_RELEASE_TABLET | Freq: Every day | ORAL | 0 refills | Status: DC

## 2021-04-03 MED ORDER — ROSUVASTATIN CALCIUM 40 MG PO TABS: 40.0000 mg | ORAL_TABLET | Freq: Every day | ORAL | 0 refills | Status: DC

## 2021-04-03 NOTE — Telephone Encounter (Signed)
*  STAT* If patient is at the pharmacy, call can be transferred to refill team.   1. Which medications need to be refilled? (please list name of each medication and dose if known) Crestor, Protonix, Metoprolol,   2. Which pharmacy/location (including street and city if local pharmacy) is medication to be sent to? [facility] [address]  3. Do they need a 30 day or 90 day supply? 90

## 2021-04-26 ENCOUNTER — Telehealth: Payer: Self-pay | Admitting: Cardiovascular Disease

## 2021-04-26 NOTE — Telephone Encounter (Signed)
Patient calling to discuss stabbing pain in Left leg .  Weight bearing causes pain and goes away when off leg.  Patient c/o pain swelling in Left leg only   Patient denies any other symptoms . Scheduled already for 12/22 [place] .

## 2021-04-26 NOTE — Telephone Encounter (Addendum)
Returned the patients call. Patient denies injury to his left left.  Patient sts that the pain is worse when he bears weight on his leg and is relived when he is off of it. Patient sts that he has noticed some mild LE swelling of his left leg. He denies any red areas or warmth of the leg.   He described the pain as stabbing and thinks it may be related to a pinched nerve in his back/hip. Adv the patient that I would recommend that he f/u with his pcp for eval. Patient sts that he does not have a pcp. Recommended that he be seen in an urgent care clinic.  Provided the patient the telephone number for [facility] walk in clinic.  Patient verbalized understanding and voiced appreciation for then call back.

## 2021-05-09 ENCOUNTER — Other Ambulatory Visit: Payer: Self-pay

## 2021-05-09 ENCOUNTER — Ambulatory Visit (INDEPENDENT_AMBULATORY_CARE_PROVIDER_SITE_OTHER): Payer: Self-pay | Admitting: Cardiovascular Disease

## 2021-05-09 ENCOUNTER — Encounter: Payer: Self-pay | Admitting: Cardiovascular Disease

## 2021-05-09 VITALS — BP 128/80 | HR 75 | Ht 63.0 in | Wt 191.1 lb

## 2021-05-09 DIAGNOSIS — E785 Hyperlipidemia, unspecified: Secondary | ICD-10-CM

## 2021-05-09 DIAGNOSIS — I739 Peripheral vascular disease, unspecified: Secondary | ICD-10-CM

## 2021-05-09 DIAGNOSIS — I10 Essential (primary) hypertension: Secondary | ICD-10-CM

## 2021-05-09 DIAGNOSIS — I2510 Atherosclerotic heart disease of native coronary artery without angina pectoris: Secondary | ICD-10-CM

## 2021-05-09 DIAGNOSIS — Z951 Presence of aortocoronary bypass graft: Secondary | ICD-10-CM

## 2021-05-09 NOTE — Patient Instructions (Signed)
Medication Instructions:  Your physician recommends that you continue on your current medications as directed. Please refer to the Current Medication list given to you today.  *If you need a refill on your cardiac medications before your next appointment, please call your pharmacy*   Lab Work: None ordered  If you have labs (blood work) drawn today and your tests are completely normal, you will receive your results only by: MyChart Message (if you have MyChart) OR A paper copy in the mail If you have any lab test that is abnormal or we need to change your treatment, we will call you to review the results.   Testing/Procedures: Your physician has requested that you have a lower extremity arterial segmental doppler   Follow-Up: At [facility], you and your health needs are our priority.  As part of our continuing mission to provide you with exceptional heart care, we have created designated Provider Care Teams.  These Care Teams include your primary Cardiologist (physician) and Advanced Practice Providers (APPs -  Physician Assistants and Nurse Practitioners) who all work together to provide you with the care you need, when you need it.  We recommend signing up for the patient portal called "MyChart".  Sign up information is provided on this After Visit Summary.  MyChart is used to connect with patients for Virtual Visits (Telemedicine).  Patients are able to view lab/test results, encounter notes, upcoming appointments, etc.  Non-urgent messages can be sent to your provider as well.   To learn more about what you can do with MyChart, go to [email or web address].    Your next appointment:   6 month(s)  The format for your next appointment:   In Person  Provider:   You may see Lorine Bears, MD or one of the following Advanced Practice Providers on your designated Care Team:   Nicolasa Ducking, NP Eula Listen, PA-C Cadence Fransico Michael, PA-C{    Other Instructions N/A

## 2021-05-09 NOTE — Progress Notes (Signed)
Cardiology Office Note   Date:  05/09/2021   ID:  Jeffery Sweeney, DOB 1961/03/05, MRN [id number]  PCP:  Iran Ouch, MD  Cardiologist:   Lorine Bears, MD   Chief Complaint  Patient presents with   Other    4 month f/u no complaints today. Meds reviewed verbally with pt.      History of Present Illness: Jeffery Sweeney is a 60 y.o. male who presents for a follow-up visit regarding coronary artery disease.  He has known history of essential hypertension, tobacco use and hyperlipidemia. The patient had previous PCI in [place] in 2019.  He presented in February of this year with unstable angina.  Cardiac catheterization showed significant three-vessel coronary artery disease.  Echo showed normal LV systolic function.  The patient underwent CABG x3 with LIMA to LAD, SVG to right PDA and SVG to ramus. He had a Lexiscan Myoview in July of this year for atypical chest pain that showed no evidence of ischemia with normal ejection fraction.  He has been doing well from a cardiac standpoint with no chest pain or shortness of breath.  He reports left leg pain that started 1 month ago.  There is sharp shooting sensation from the left hip all the way down to the ankle.  In addition, there are some heaviness in his left leg.   Past Medical History:  Diagnosis Date   Hypertension    Multiple vessel coronary artery disease    Myocardial infarct (HCC)    x 3; PCI with single stent placement for each (in [place])   NSTEMI (non-ST elevated myocardial infarction) (HCC)    LHC 2/18 with multivessel CAD and transfer to [facility] for CABG eval   Tobacco use    2 cigarettes daily    Past Surgical History:  Procedure Laterality Date   CORONARY ANGIOPLASTY WITH STENT PLACEMENT     in [place] in 2018-19   CORONARY ARTERY BYPASS GRAFT N/A 07/09/2020   Procedure: CORONARY ARTERY BYPASS GRAFTING (CABG) x THREE , USING LEFT INTERNAL MAMMARY ARTERY, AND RIGHT LEG GREATER SAPHENOUS VEIN  HARVESTED ENDOSCOPICALLY;  Surgeon: Delight Ovens, MD;  Location: [facility] OR;  Service: Open Heart Surgery;  Laterality: N/A;   INTRAVASCULAR PRESSURE WIRE/FFR STUDY N/A 07/06/2020   Procedure: INTRAVASCULAR PRESSURE WIRE/FFR STUDY;  Surgeon: Iran Ouch, MD;  Location: [facility] INVASIVE CV LAB;  Service: Cardiovascular;  Laterality: N/A;  LAD   LEFT HEART CATH AND CORONARY ANGIOGRAPHY N/A 07/06/2020   Procedure: LEFT HEART CATH AND CORONARY ANGIOGRAPHY;  Surgeon: Iran Ouch, MD;  Location: [facility] INVASIVE CV LAB;  Service: Cardiovascular;  Laterality: N/A;   TEE WITHOUT CARDIOVERSION N/A 07/09/2020   Procedure: TRANSESOPHAGEAL ECHOCARDIOGRAM (TEE);  Surgeon: Delight Ovens, MD;  Location: [facility] OR;  Service: Open Heart Surgery;  Laterality: N/A;   WRIST SURGERY Left    for cyst     Current Outpatient Medications  Medication Sig Dispense Refill   aspirin EC 81 MG tablet Take 81 mg by mouth daily. Swallow whole.     HYDROcodone-acetaminophen (NORCO/VICODIN) 5-325 MG tablet Take 1 tablet by mouth every 6 (six) hours as needed for severe pain. 8 tablet 0   ibuprofen (ADVIL) 200 MG tablet Take 200 mg by mouth every 6 (six) hours as needed.     metoprolol tartrate (LOPRESSOR) 25 MG tablet Take 1.5 tablets (37.5 mg total) by mouth 2 (two) times daily. 270 tablet 0   nitroGLYCERIN (NITROSTAT) 0.4 MG SL tablet Place  1 tablet (0.4 mg total) under the tongue every 5 (five) minutes as needed for chest pain. 25 tablet 3   pantoprazole (PROTONIX) 40 MG tablet Take 1 tablet (40 mg total) by mouth once daily. 90 tablet 0   rosuvastatin (CRESTOR) 40 MG tablet Take 1 tablet (40 mg total) by mouth once daily. 90 tablet 0   silver sulfADIAZINE (SILVADENE) 1 % cream Apply to affected area daily 50 g 1   No current facility-administered medications for this visit.    Allergies:   Patient has no known allergies.    Social History:  The patient  reports that he quit smoking about 10 months ago. His  smoking use included cigarettes. He smoked an average of .5 packs per day. He has never used smokeless tobacco. He reports that he does not currently use alcohol. He reports that he does not currently use drugs.   Family History:  The patient's family history is not on file.    ROS:  Please see the history of present illness.   Otherwise, review of systems are positive for .   All other systems are reviewed and negative.    PHYSICAL EXAM: VS:  BP 128/80 (BP Location: Left Arm, Patient Position: Sitting, Cuff Size: Normal)    Pulse 75    Ht 5\' 3"  (1.6 m)    Wt 191 lb 2 oz (86.7 kg)    SpO2 98%    BMI 33.86 kg/m  , BMI Body mass index is 33.86 kg/m. GEN: Well nourished, well developed, in no acute distress  HEENT: normal  Neck: no JVD, carotid bruits, or masses Cardiac: RRR; no murmurs, rubs, or gallops,no edema  Respiratory:  clear to auscultation bilaterally, normal work of breathing GI: soft, nontender, nondistended, + BS MS: no deformity or atrophy  Skin: warm and dry, no rash Neuro:  Strength and sensation are intact Psych: euthymic mood, full affect Vascular: Femoral pulse: +2 bilaterally.  Posterior tibial: +2 on the right and +1 on the left.  Dorsalis pedis: +2 on the right and absent on the left   EKG:  EKG is ordered today. EKG showed normal sinus rhythm with no significant ST or T wave changes.   Recent Labs: 08/03/2020: B Natriuretic Peptide 66.5 11/22/2020: Magnesium 1.9; TSH 2.229 02/05/2021: ALT 17; BUN 13; Creatinine, Ser 0.79; Hemoglobin 15.0; Platelets 245; Potassium 3.7; Sodium 137    Lipid Panel    Component Value Date/Time   CHOL 135 08/03/2020 1545   TRIG 170 (H) 08/03/2020 1545   HDL 38 (L) 08/03/2020 1545   CHOLHDL 3.6 08/03/2020 1545   VLDL 34 08/03/2020 1545   LDLCALC 63 08/03/2020 1545      Wt Readings from Last 3 Encounters:  05/09/21 191 lb 2 oz (86.7 kg)  01/03/21 186 lb (84.4 kg)  12/16/20 186 lb (84.4 kg)       No flowsheet data  found.    ASSESSMENT AND PLAN:  1.  Coronary artery disease involving native coronary arteries without angina: He is doing well overall.  Continue medical therapy.  2.  Essential hypertension: Blood pressure is well controlled on current medications.  3.  Hyperlipidemia: Most recent lipid profile in March showed an LDL of 63.  Continue high-dose rosuvastatin.  4.  Left leg pain: Some of his symptoms seems to be neuropathic starting from the left thigh all the way down.  However, he also seems to have claudication and his vascular exam is abnormal with diminished pulses in the  left foot compared to the right.  I requested lower extremity arterial Doppler.   Disposition:   FU in 6 months or earlier if arterial Doppler significantly abnormal.  Signed,  Lorine Bears, MD  05/09/2021 10:04 AM    [facility]

## 2021-06-07 ENCOUNTER — Other Ambulatory Visit: Payer: Self-pay

## 2021-06-07 ENCOUNTER — Ambulatory Visit (INDEPENDENT_AMBULATORY_CARE_PROVIDER_SITE_OTHER): Payer: Self-pay

## 2021-06-07 DIAGNOSIS — I739 Peripheral vascular disease, unspecified: Secondary | ICD-10-CM

## 2021-06-11 ENCOUNTER — Telehealth: Payer: Self-pay

## 2021-06-11 NOTE — Telephone Encounter (Signed)
-----   Message from Iran Ouch, MD sent at 06/11/2021  8:09 AM EST ----- Normal ABI.  No evidence of peripheral arterial disease.  Left leg pain is not due to decreased circulation.

## 2021-06-11 NOTE — Telephone Encounter (Signed)
Called to give the patient results. Lmtcb. 

## 2021-06-11 NOTE — Telephone Encounter (Signed)
Patient made aware of results with verbalized understanding. 

## 2021-06-11 NOTE — Telephone Encounter (Signed)
Patient calling to discuss recent testing results  ° °Please call  ° °

## 2021-06-19 DIAGNOSIS — I639 Cerebral infarction, unspecified: Secondary | ICD-10-CM

## 2021-06-19 HISTORY — DX: Cerebral infarction, unspecified: I63.9

## 2021-06-21 ENCOUNTER — Telehealth: Payer: Self-pay | Admitting: Pharmacist

## 2021-06-21 NOTE — Telephone Encounter (Signed)
Patient failed to provide requested 2023 financial documentation. No additional medication assistance will be provided by [facility] without the required proof of income documentation. Patient notified by letter. ? ?Jeffery Sweeney ?[facility] ?

## 2021-06-25 ENCOUNTER — Telehealth: Payer: Self-pay | Admitting: Pharmacy Technician

## 2021-06-25 NOTE — Telephone Encounter (Signed)
°  [place], [place]  [address]  June 21, 2021    Dear Herbie Baltimore:  This is to inform you that you are no longer eligible to receive medication assistance at [facility].  The reason(s) are:    _____Your total gross monthly household income exceeds 300% of the Federal Poverty Level.   _____Tangible assets (savings, checking, stocks/bonds, pension, retirement, etc.) exceeds our limit  _____You are eligible to receive benefits from [facility], [facility] or HIV Medication              Assistance Program _____You are eligible to receive benefits from a Medicare Part D plan _____You have prescription insurance  _____You are not an [facility] resident __X__Failure to provide all requested documentation (proof of income for 2023, and/or Patient Intake Application, DOH Attestation, Contract, etc).    Medication assistance will resume once all requested documentation has been returned to our clinic.  If you have questions, please contact our clinic at [phone].    Thank you,  [facility]

## 2021-07-02 ENCOUNTER — Other Ambulatory Visit: Payer: Self-pay

## 2021-07-05 ENCOUNTER — Telehealth: Payer: Self-pay | Admitting: Cardiovascular Disease

## 2021-07-05 ENCOUNTER — Observation Stay
Admission: EM | Admit: 2021-07-05 | Discharge: 2021-07-06 | Disposition: A | Discharge status: Home / Self Care | Payer: Self-pay | Attending: Student in an Organized Health Care Education/Training Program | Admitting: Student in an Organized Health Care Education/Training Program

## 2021-07-05 ENCOUNTER — Emergency Department: Payer: Self-pay

## 2021-07-05 ENCOUNTER — Observation Stay: Payer: Self-pay

## 2021-07-05 ENCOUNTER — Other Ambulatory Visit: Payer: Self-pay

## 2021-07-05 ENCOUNTER — Encounter: Payer: Self-pay | Admitting: Emergency Medicine

## 2021-07-05 DIAGNOSIS — I639 Cerebral infarction, unspecified: Principal | ICD-10-CM

## 2021-07-05 DIAGNOSIS — Z87891 Personal history of nicotine dependence: Secondary | ICD-10-CM | POA: Insufficient documentation

## 2021-07-05 DIAGNOSIS — I2510 Atherosclerotic heart disease of native coronary artery without angina pectoris: Secondary | ICD-10-CM | POA: Insufficient documentation

## 2021-07-05 DIAGNOSIS — Z955 Presence of coronary angioplasty implant and graft: Secondary | ICD-10-CM | POA: Insufficient documentation

## 2021-07-05 DIAGNOSIS — Z951 Presence of aortocoronary bypass graft: Secondary | ICD-10-CM | POA: Insufficient documentation

## 2021-07-05 DIAGNOSIS — Z20822 Contact with and (suspected) exposure to covid-19: Secondary | ICD-10-CM | POA: Insufficient documentation

## 2021-07-05 DIAGNOSIS — Z79899 Other long term (current) drug therapy: Secondary | ICD-10-CM | POA: Insufficient documentation

## 2021-07-05 DIAGNOSIS — Z7982 Long term (current) use of aspirin: Secondary | ICD-10-CM | POA: Insufficient documentation

## 2021-07-05 DIAGNOSIS — R42 Dizziness and giddiness: Secondary | ICD-10-CM | POA: Insufficient documentation

## 2021-07-05 DIAGNOSIS — I10 Essential (primary) hypertension: Secondary | ICD-10-CM | POA: Insufficient documentation

## 2021-07-05 LAB — CBC
HCT: 45 % (ref 39.0–52.0)
Hemoglobin: 14.7 g/dL (ref 13.0–17.0)
MCH: 28.7 pg (ref 26.0–34.0)
MCHC: 32.7 g/dL (ref 30.0–36.0)
MCV: 87.9 fL (ref 80.0–100.0)
Platelets: 222 10*3/uL (ref 150–400)
RBC: 5.12 MIL/uL (ref 4.22–5.81)
RDW: 13.3 % (ref 11.5–15.5)
WBC: 7.6 10*3/uL (ref 4.0–10.5)
nRBC: 0 % (ref 0.0–0.2)

## 2021-07-05 LAB — URINALYSIS, ROUTINE W REFLEX MICROSCOPIC
Bilirubin Urine: NEGATIVE
Glucose, UA: NEGATIVE mg/dL
Hgb urine dipstick: NEGATIVE
Ketones, ur: NEGATIVE mg/dL
Nitrite: NEGATIVE
Protein, ur: 30 mg/dL — AB
Specific Gravity, Urine: 1.036 — ABNORMAL HIGH (ref 1.005–1.030)
pH: 5 (ref 5.0–8.0)

## 2021-07-05 LAB — BASIC METABOLIC PANEL
Anion gap: 4 — ABNORMAL LOW (ref 5–15)
BUN: 15 mg/dL (ref 8–23)
CO2: 24 mmol/L (ref 22–32)
Calcium: 8.7 mg/dL — ABNORMAL LOW (ref 8.9–10.3)
Chloride: 109 mmol/L (ref 98–111)
Creatinine, Ser: 1.21 mg/dL (ref 0.61–1.24)
GFR, Estimated: >60 mL/min (ref >60)
Glucose, Bld: 93 mg/dL (ref 70–99)
Potassium: 3.9 mmol/L (ref 3.5–5.1)
Sodium: 137 mmol/L (ref 135–145)

## 2021-07-05 LAB — CBG MONITORING, ED: Glucose-Capillary: 83 mg/dL (ref 70–99)

## 2021-07-05 LAB — RESP PANEL BY RT-PCR (FLU A&B, COVID) ARPGX2
Influenza A by PCR: NEGATIVE
Influenza B by PCR: NEGATIVE
SARS Coronavirus 2 by RT PCR: NEGATIVE

## 2021-07-05 MED ORDER — PANTOPRAZOLE SODIUM 40 MG PO TBEC
40.0000 mg | DELAYED_RELEASE_TABLET | Freq: Every day | ORAL | Status: DC
  Administered 2021-07-06: 09:00:00 40 mg via ORAL
  Filled 2021-07-05: qty 1

## 2021-07-05 MED ORDER — IOHEXOL 350 MG/ML SOLN
75.0000 mL | Freq: Once | INTRAVENOUS | Status: AC | PRN
  Administered 2021-07-05: 75 mL via INTRAVENOUS

## 2021-07-05 MED ORDER — MECLIZINE HCL 25 MG PO TABS
25.0000 mg | ORAL_TABLET | Freq: Once | ORAL | Status: AC
  Administered 2021-07-05: 25 mg via ORAL
  Filled 2021-07-05: qty 1

## 2021-07-05 MED ORDER — STROKE: EARLY STAGES OF RECOVERY BOOK: Freq: Once | Status: DC

## 2021-07-05 MED ORDER — ACETAMINOPHEN 650 MG RE SUPP: 650.0000 mg | RECTAL | Status: DC | PRN

## 2021-07-05 MED ORDER — MECLIZINE HCL 25 MG PO TABS: 25.0000 mg | ORAL_TABLET | Freq: Three times a day (TID) | ORAL | 0 refills | Status: DC | PRN

## 2021-07-05 MED ORDER — ACETAMINOPHEN 160 MG/5ML PO SOLN
650.0000 mg | ORAL | Status: DC | PRN
  Filled 2021-07-05: qty 20.3

## 2021-07-05 MED ORDER — ENOXAPARIN SODIUM 60 MG/0.6ML IJ SOSY
0.5000 mg/kg | PREFILLED_SYRINGE | INTRAMUSCULAR | Status: DC
  Administered 2021-07-05: 22:00:00 42.5 mg via SUBCUTANEOUS
  Filled 2021-07-05: qty 0.6

## 2021-07-05 MED ORDER — SENNOSIDES-DOCUSATE SODIUM 8.6-50 MG PO TABS: 1.0000 | ORAL_TABLET | Freq: Every evening | ORAL | Status: DC | PRN

## 2021-07-05 MED ORDER — METOPROLOL TARTRATE 25 MG PO TABS
37.5000 mg | ORAL_TABLET | Freq: Two times a day (BID) | ORAL | Status: DC
  Administered 2021-07-05 – 2021-07-06 (×2): 37.5 mg via ORAL
  Filled 2021-07-05 (×2): qty 2

## 2021-07-05 MED ORDER — ASPIRIN EC 81 MG PO TBEC
81.0000 mg | DELAYED_RELEASE_TABLET | Freq: Every day | ORAL | Status: DC
  Administered 2021-07-05 – 2021-07-06 (×2): 81 mg via ORAL
  Filled 2021-07-05 (×2): qty 1

## 2021-07-05 MED ORDER — MECLIZINE HCL 25 MG PO TABS
12.5000 mg | ORAL_TABLET | Freq: Three times a day (TID) | ORAL | Status: DC | PRN
  Administered 2021-07-06: 09:00:00 12.5 mg via ORAL
  Filled 2021-07-05 (×2): qty 0.5

## 2021-07-05 MED ORDER — ROSUVASTATIN CALCIUM 20 MG PO TABS
40.0000 mg | ORAL_TABLET | Freq: Every day | ORAL | Status: DC
  Administered 2021-07-06: 40 mg via ORAL
  Filled 2021-07-05: qty 2

## 2021-07-05 MED ORDER — ACETAMINOPHEN 325 MG PO TABS: 650.0000 mg | ORAL_TABLET | ORAL | Status: DC | PRN

## 2021-07-05 NOTE — Progress Notes (Signed)
PHARMACIST - PHYSICIAN COMMUNICATION  CONCERNING:  Enoxaparin (Lovenox) for DVT Prophylaxis    RECOMMENDATION: Patient was prescribed enoxaprin 40mg  q24 hours for VTE prophylaxis.   Filed Weights   07/05/21 1140  Weight: 86.7 kg (191 lb 2.2 oz)    Body mass index is 33.86 kg/m.  Estimated Creatinine Clearance: 62.4 mL/min (by C-G formula based on SCr of 1.21 mg/dL).   Based on [facility] policy patient is candidate for enoxaparin 0.5mg /kg TBW SQ every 24 hours based on BMI being >30.    DESCRIPTION: Pharmacy has adjusted enoxaparin dose per [facility] policy.  Patient is now receiving enoxaparin 42.5  mg every 24 hours    [facility], PharmD Clinical Pharmacist  07/05/2021 7:25 PM

## 2021-07-05 NOTE — ED Notes (Signed)
Informed RN bed assigned 

## 2021-07-05 NOTE — ED Notes (Signed)
Pt. Ambulated to bathroom indep, gait steady. Denies dizziness.

## 2021-07-05 NOTE — ED Notes (Signed)
Pt brought in by [facility] for dizziness from work. States worse since symptoms started. Has had nausea and headache. Took nitro x 1, does have a cardiac history. CBG 130 per EMS. VS WNL per EMS. ASA 324 mg given.

## 2021-07-05 NOTE — Telephone Encounter (Signed)
Call transferred directly to the RN from scheduling.  The patient called stating that he is currently at work at [employer], but about 15-20 minutes ago he developed symptoms of: - the room spinning - dizziness - nausea - his tongue feeling "funny"/ numb - leg weakness  He is currently sitting down and his dizziness is some better. I inquired if this is worse if he looks left/ right. He advised dizziness is some worse with turning his head.  I inquired if he could raise his arms. He advised he could be his arms are shaky.  I inquired if he is diabetic and he advised he is not. I inquired if he has had anything to eat/ drink this morning and he advised he has only had water.  He again stated his tongue "feels funny."  I have advised the patient that I am concerned about his current symptoms and that I do feel like he should be evaluated. I have advised him not to drive, but to hang up the phone and call 911 to come to his work place to evaluate him for possible stroke like/ TIA symptoms.  The patient voices understanding and is agreeable.   He is aware I will follow along in his chart to see if he is taken to the ER.   Will forward to Dr. Kirke Corin as an Lorain Childes.

## 2021-07-05 NOTE — ED Notes (Signed)
Patient transported to CT 

## 2021-07-05 NOTE — Telephone Encounter (Signed)
Ok thanks 

## 2021-07-05 NOTE — H&P (Signed)
History and Physical    Jeffery Sweeney XTG:[id number] DOB: 01/30/1961 DOA: 07/05/2021  PCP: Iran Ouch, MD (Confirm with patient/family/NH records and if not entered, this has to be entered at [facility] point of entry) Patient coming from: Home  I have personally briefly reviewed patient's old medical records in [facility]  Chief Complaint: Feeling dizzy  HPI: Jeffery Sweeney is a 61 y.o. male with medical history significant of CAD/NSTEMI with CABG, HTN, HLD, ex smoker, presented with new onset of vertigo.  This afternoon, while at work patient started to feel spinning-like sensations, no hearing changes, denied any numbness weakness of any of the limbs denies any nauseous vomiting.  He says he had similar symptoms couple months ago, lasted less than 1 hour and resolved by itself.  Today however symptoms persisted.  He said he still smokes occasionally.  ED Course: No tachycardia no hypotension. Brain MRI showed  Small acute/subacute infarct in the left occipital lobe and possible cerebellum.   Review of Systems: As per HPI otherwise 14 point review of systems negative.    Past Medical History:  Diagnosis Date   Hypertension    Multiple vessel coronary artery disease    Myocardial infarct (HCC)    x 3; PCI with single stent placement for each (in [place])   NSTEMI (non-ST elevated myocardial infarction) (HCC)    LHC 2/18 with multivessel CAD and transfer to [facility] for CABG eval   Tobacco use    2 cigarettes daily    Past Surgical History:  Procedure Laterality Date   CORONARY ANGIOPLASTY WITH STENT PLACEMENT     in [place] in 2018-19   CORONARY ARTERY BYPASS GRAFT N/A 07/09/2020   Procedure: CORONARY ARTERY BYPASS GRAFTING (CABG) x THREE , USING LEFT INTERNAL MAMMARY ARTERY, AND RIGHT LEG GREATER SAPHENOUS VEIN HARVESTED ENDOSCOPICALLY;  Surgeon: Delight Ovens, MD;  Location: [facility] OR;  Service: Open Heart Surgery;  Laterality: N/A;   INTRAVASCULAR PRESSURE  WIRE/FFR STUDY N/A 07/06/2020   Procedure: INTRAVASCULAR PRESSURE WIRE/FFR STUDY;  Surgeon: Iran Ouch, MD;  Location: [facility] INVASIVE CV LAB;  Service: Cardiovascular;  Laterality: N/A;  LAD   LEFT HEART CATH AND CORONARY ANGIOGRAPHY N/A 07/06/2020   Procedure: LEFT HEART CATH AND CORONARY ANGIOGRAPHY;  Surgeon: Iran Ouch, MD;  Location: [facility] INVASIVE CV LAB;  Service: Cardiovascular;  Laterality: N/A;   TEE WITHOUT CARDIOVERSION N/A 07/09/2020   Procedure: TRANSESOPHAGEAL ECHOCARDIOGRAM (TEE);  Surgeon: Delight Ovens, MD;  Location: [facility] OR;  Service: Open Heart Surgery;  Laterality: N/A;   WRIST SURGERY Left    for cyst     reports that he quit smoking about a year ago. His smoking use included cigarettes. He smoked an average of .5 packs per day. He has never used smokeless tobacco. He reports that he does not currently use alcohol. He reports that he does not currently use drugs.  No Known Allergies  History reviewed. No pertinent family history.   Prior to Admission medications   Medication Sig Start Date End Date Taking? Authorizing Provider  meclizine (ANTIVERT) 25 MG tablet Take 1 tablet (25 mg total) by mouth 3 (three) times daily as needed for dizziness. 07/05/21  Yes Minna Antis, MD  metoprolol tartrate (LOPRESSOR) 25 MG tablet Take 1.5 tablets (37.5 mg total) by mouth 2 (two) times daily. 04/03/21 07/05/21 Yes Iran Ouch, MD  pantoprazole (PROTONIX) 40 MG tablet Take 1 tablet (40 mg total) by mouth once daily. 04/03/21 07/05/21 Yes Arida,  Chelsea Aus, MD  rosuvastatin (CRESTOR) 40 MG tablet Take 1 tablet (40 mg total) by mouth once daily. 04/03/21 07/05/21 Yes Iran Ouch, MD  aspirin EC 81 MG tablet Take 81 mg by mouth daily. Swallow whole.    [provider]  HYDROcodone-acetaminophen (NORCO/VICODIN) 5-325 MG tablet Take 1 tablet by mouth every 6 (six) hours as needed for severe pain. Patient not taking: Reported on 07/05/2021 10/10/20  10/10/21  Shaune Pollack, MD  ibuprofen (ADVIL) 200 MG tablet Take 200 mg by mouth every 6 (six) hours as needed.    [provider]  nitroGLYCERIN (NITROSTAT) 0.4 MG SL tablet Place 1 tablet (0.4 mg total) under the tongue every 5 (five) minutes as needed for chest pain. 10/05/20   Marisue Ivan D, PA-C  silver sulfADIAZINE (SILVADENE) 1 % cream Apply to affected area daily 12/16/20 12/16/21  Chinita Pester, FNP    Physical Exam: Vitals:   07/05/21 1800 07/05/21 1815 07/05/21 1830 07/05/21 1845  BP:      Pulse: 62 65 77 65  Resp: 12 13 13 15   Temp:      TempSrc:      SpO2: 97% 94% 98% 98%  Weight:      Height:        Constitutional: NAD, calm, comfortable Vitals:   07/05/21 1800 07/05/21 1815 07/05/21 1830 07/05/21 1845  BP:      Pulse: 62 65 77 65  Resp: 12 13 13 15   Temp:      TempSrc:      SpO2: 97% 94% 98% 98%  Weight:      Height:       Eyes: PERRL, lids and conjunctivae normal ENMT: Mucous membranes are moist. Posterior pharynx clear of any exudate or lesions.Normal dentition.  Neck: normal, supple, no masses, no thyromegaly Respiratory: clear to auscultation bilaterally, no wheezing, no crackles. Normal respiratory effort. No accessory muscle use.  Cardiovascular: Regular rate and rhythm, no murmurs / rubs / gallops. No extremity edema. 2+ pedal pulses. No carotid bruits.  Abdomen: no tenderness, no masses palpated. No hepatosplenomegaly. Bowel sounds positive.  Musculoskeletal: no clubbing / cyanosis. No joint deformity upper and lower extremities. Good ROM, no contractures. Normal muscle tone.  Skin: no rashes, lesions, ulcers. No induration Neurologic: CN 2-12 grossly intact. Sensation intact, DTR normal. Strength 5/5 in all 4.  No nystagmus Psychiatric: Normal judgment and insight. Alert and oriented x 3. Normal mood.     Labs on Admission: I have personally reviewed following labs and imaging studies  CBC: Recent Labs  Lab 07/05/21 1143  WBC  7.6  HGB 14.7  HCT 45.0  MCV 87.9  PLT 222   Basic Metabolic Panel: Recent Labs  Lab 07/05/21 1143  NA 137  K 3.9  CL 109  CO2 24  GLUCOSE 93  BUN 15  CREATININE 1.21  CALCIUM 8.7*   GFR: Estimated Creatinine Clearance: 62.4 mL/min (by C-G formula based on SCr of 1.21 mg/dL). Liver Function Tests: No results for input(s): AST, ALT, ALKPHOS, BILITOT, PROT, ALBUMIN in the last 168 hours. No results for input(s): LIPASE, AMYLASE in the last 168 hours. No results for input(s): AMMONIA in the last 168 hours. Coagulation Profile: No results for input(s): INR, PROTIME in the last 168 hours. Cardiac Enzymes: No results for input(s): CKTOTAL, CKMB, CKMBINDEX, TROPONINI in the last 168 hours. BNP (last 3 results) No results for input(s): PROBNP in the last 8760 hours. HbA1C: No results for input(s): HGBA1C in the  last 72 hours. CBG: Recent Labs  Lab 07/05/21 1452  GLUCAP 83   Lipid Profile: No results for input(s): CHOL, HDL, LDLCALC, TRIG, CHOLHDL, LDLDIRECT in the last 72 hours. Thyroid Function Tests: No results for input(s): TSH, T4TOTAL, FREET4, T3FREE, THYROIDAB in the last 72 hours. Anemia Panel: No results for input(s): VITAMINB12, FOLATE, FERRITIN, TIBC, IRON, RETICCTPCT in the last 72 hours. Urine analysis:    Component Value Date/Time   COLORURINE YELLOW (A) 07/05/2021 1143   APPEARANCEUR HAZY (A) 07/05/2021 1143   LABSPEC 1.036 (H) 07/05/2021 1143   PHURINE 5.0 07/05/2021 1143   GLUCOSEU NEGATIVE 07/05/2021 1143   HGBUR NEGATIVE 07/05/2021 1143   BILIRUBINUR NEGATIVE 07/05/2021 1143   KETONESUR NEGATIVE 07/05/2021 1143   PROTEINUR 30 (A) 07/05/2021 1143   NITRITE NEGATIVE 07/05/2021 1143   LEUKOCYTESUR TRACE (A) 07/05/2021 1143    Radiological Exams on Admission: MR BRAIN WO CONTRAST  Result Date: 07/05/2021 CLINICAL DATA:  Acute onset of severe dizziness. EXAM: MRI HEAD WITHOUT CONTRAST TECHNIQUE: Multiplanar, multiecho pulse sequences of the brain  and surrounding structures were obtained without intravenous contrast. COMPARISON:  Head CT 06/28/2020 FINDINGS: The study is intermittently up to moderately motion degraded. Brain: There are small acute to subacute cortical infarcts in the left occipital lobe. A punctate acute or subacute infarct is questioned in the right cerebellar hemisphere although this could be artifact. No intracranial hemorrhage, mass, midline shift, or extra-axial fluid collection is identified. No significant chronic white matter disease is seen for age. The ventricles and sulci are within normal limits for age. Vascular: Major intracranial vascular flow voids are preserved. Skull and upper cervical spine: Unremarkable bone marrow signal. Sinuses/Orbits: Unremarkable orbits. Mild bilateral ethmoid air cell mucosal thickening. Clear mastoid air cells. Other: None. IMPRESSION: Small acute/subacute infarcts in the left occipital lobe and possibly cerebellum. Electronically Signed   By: Sebastian Ache M.D.   On: 07/05/2021 16:25    EKG: Independently reviewed.  Sinus rhythm, no acute ST changes.  Assessment/Plan Principal Problem:   Stroke (cerebrum) (HCC)  (please populate well all problems here in Problem List. (For example, if patient is on BP meds at home and you resume or decide to hold them, it is a problem that needs to be her. Same for CAD, COPD, HLD and so on)  Acute vertigo -Secondary to left occipital/cerebellum infarct. -Continue aspirin and statin -Allow permissive hypertension -As needed meclizine. -CT angiogram head and neck and echocardiogram -Telemetry monitoring x24 hours if negative, referred to see cardiology outpatient for Zio patch to rule out PAF.  Explained patient the planned, and patient expressed understanding and agreed.  CAD status post CABG -Stable, no acute issue, continue beta-blocker.  Cigarette smoke -Cessation consultation.  DVT prophylaxis: Lovenox Code Status: Full code Family  Communication: None at bedside Disposition Plan: Expect less than 2 midnight hospital stay Consults called: None Admission status: Telemetry observation   Emeline General MD [facility] Pager [phone]  07/05/2021, 7:00 PM

## 2021-07-05 NOTE — ED Provider Notes (Signed)
[facility] Provider Note    Event Date/Time   First MD Initiated Contact with Patient 07/05/21 1334     (approximate)  History   Chief Complaint: Dizziness  HPI  Jeffery Sweeney is a 61 y.o. male with a past medical history of hypertension, prior NSTEMI and CABG who presents to the emergency department for dizziness.  According to the patient several hours ago while working he developed acute onset of severe dizziness.  States he remembers turning around and then feeling significant dizziness as if the room is spinning around him.  Became very nauseated.  Patient denies any history of vertigo in the past.  Denies any weakness numbness or headache at any point.  Patient did however state during the more significant part of the dizziness he felt like his tongue was tingling.  No history of CVA previously.  Patient states his symptoms have diminished significantly as long as he is lying still in bed.  Physical Exam   Triage Vital Signs: ED Triage Vitals  Enc Vitals Group     BP 07/05/21 1142 131/89     Pulse Rate 07/05/21 1142 86     Resp 07/05/21 1142 16     Temp 07/05/21 1142 98.2 F (36.8 C)     Temp Source 07/05/21 1142 Oral     SpO2 07/05/21 1142 94 %     Weight 07/05/21 1140 191 lb 2.2 oz (86.7 kg)     Height 07/05/21 1140 5\' 3"  (1.6 m)     Head Circumference --      Peak Flow --      Pain Score 07/05/21 1140 6     Pain Loc --      Pain Edu? --      Excl. in GC? --     Most recent vital signs: Vitals:   07/05/21 1142  BP: 131/89  Pulse: 86  Resp: 16  Temp: 98.2 F (36.8 C)  SpO2: 94%    General: Awake, no distress.  CV:  Good peripheral perfusion.  Regular rate and rhythm  Resp:  Normal effort.  Equal breath sounds bilaterally.  Abd:  No distention.  Soft, nontender.  No rebound or guarding. Other:  Equal grip strength bilaterally 5/5 motor in all extremities, no pronator drift, normal cranial nerves.   ED Results / Procedures /  Treatments   EKG  EKG viewed and interpreted by myself shows a normal sinus rhythm at 83 bpm with a narrow QRS, normal axis, normal intervals, no concerning ST changes.  RADIOLOGY  MRI pending   MEDICATIONS ORDERED IN ED: Medications  meclizine (ANTIVERT) tablet 25 mg (25 mg Oral Given 07/05/21 1352)     IMPRESSION / MDM / ASSESSMENT AND PLAN / ED COURSE  I reviewed the triage vital signs and the nursing notes.  Patient presents emergency department for cute onset of dizziness which she describes as a room spinning sensation but also associated with tingling of the tongue.  Patient's symptoms are very suggestive of BPPV, however given no history of vertigo in the past as well as a sensation of tingling in the tongue during his symptoms I believe an MRI is warranted to rule out CVA.  Patient is agreeable to plan of care.  Remainder the patient's work-up is reassuring with a normal CBC, normal chemistry and normal renal function.  MRI pending, patient care signed out to oncoming provider.  FINAL CLINICAL IMPRESSION(S) / ED DIAGNOSES   Dizziness  Rx /  DC Orders   Meclizine  Note:  This document was prepared using Dragon voice recognition software and may include unintentional dictation errors.   Minna Antis, MD 07/05/21 1414

## 2021-07-05 NOTE — ED Triage Notes (Signed)
Pt comes into the ED via [facility] from work c/o dizziness.  Pt states he was working with no problems and then he turned around and then the room started spinning.  PT also admits to a headache.  Pt given 324 ASA by EMS.  Pt also had some nausea.  Pt currently neurologically intact at this time.

## 2021-07-05 NOTE — Telephone Encounter (Signed)
The patient is currently in the [facility] ED for evaluation.

## 2021-07-05 NOTE — Plan of Care (Signed)
The patient is admitted to 1 C 116. A & O x 4. He denies any acute pain. The patient is oriented to his room, call beel/ascm and  staff. Neuro assessment with [facility] completed with no deficit. Will continue to monitor.

## 2021-07-05 NOTE — Telephone Encounter (Signed)
Pt c/o Syncope: STAT if syncope occurred within 30 minutes and pt complains of lightheadedness High Priority if episode of passing out, completely, today or in last 24 hours   Did you pass out today? No, everything felt like spinning, headache, tongue feeling numb. Took a tylenol, stomach feeling upset  When is the last time you passed out? First time happened  Has this occurred multiple times? no  Did you have any symptoms prior to passing out?  no

## 2021-07-05 NOTE — Discharge Instructions (Addendum)
Please take your meclizine as needed, as written.  Please follow-up with your doctor in the next several days for recheck/reevaluation.  Please avoid quick movements with your head.  Return to the emergency department for any symptom personally concerning to yourself such as weakness or numbness of any arm or leg confusion or difficulty speaking. Please take aspirin 325mg  every day indefinitely.  Please take plavix 75mg  for 90 days Further treatment will be discussed at your neurology follow up.

## 2021-07-05 NOTE — ED Notes (Signed)
Pt speaking to MRI on phone

## 2021-07-06 ENCOUNTER — Observation Stay (HOSPITAL_BASED_OUTPATIENT_CLINIC_OR_DEPARTMENT_OTHER)
Admit: 2021-07-06 | Discharge: 2021-07-06 | Disposition: A | Discharge status: Home / Self Care | Payer: Self-pay | Attending: Internal Medicine | Admitting: Internal Medicine

## 2021-07-06 DIAGNOSIS — I669 Occlusion and stenosis of unspecified cerebral artery: Secondary | ICD-10-CM

## 2021-07-06 DIAGNOSIS — I10 Essential (primary) hypertension: Secondary | ICD-10-CM

## 2021-07-06 DIAGNOSIS — I6389 Other cerebral infarction: Secondary | ICD-10-CM

## 2021-07-06 DIAGNOSIS — I6359 Cerebral infarction due to unspecified occlusion or stenosis of other cerebral artery: Secondary | ICD-10-CM

## 2021-07-06 DIAGNOSIS — I25810 Atherosclerosis of coronary artery bypass graft(s) without angina pectoris: Secondary | ICD-10-CM

## 2021-07-06 DIAGNOSIS — E785 Hyperlipidemia, unspecified: Secondary | ICD-10-CM

## 2021-07-06 LAB — LIPID PANEL
Cholesterol: 151 mg/dL (ref 0–200)
HDL: 32 mg/dL — ABNORMAL LOW (ref >40)
LDL Cholesterol: 80 mg/dL (ref 0–99)
Total CHOL/HDL Ratio: 4.7 RATIO
Triglycerides: 194 mg/dL — ABNORMAL HIGH (ref <150)
VLDL: 39 mg/dL (ref 0–40)

## 2021-07-06 LAB — HEMOGLOBIN A1C
Hgb A1c MFr Bld: 5.8 % — ABNORMAL HIGH (ref 4.8–5.6)
Mean Plasma Glucose: 119.76 mg/dL

## 2021-07-06 LAB — ECHOCARDIOGRAM COMPLETE
AR max vel: 1.52 cm2
AV Peak grad: 6.7 mmHg
Ao pk vel: 1.29 m/s
Area-P 1/2: 3.61 cm2
Height: 63 in
S' Lateral: 3.04 cm
Weight: 3058.22 oz

## 2021-07-06 MED ORDER — ASPIRIN EC 325 MG PO TBEC
325.0000 mg | DELAYED_RELEASE_TABLET | Freq: Every day | ORAL | 0 refills | Status: AC
Start: 2021-07-06 — End: 2021-09-04

## 2021-07-06 MED ORDER — CLOPIDOGREL BISULFATE 75 MG PO TABS: 75.0000 mg | ORAL_TABLET | Freq: Every day | ORAL | 0 refills | Status: DC

## 2021-07-06 NOTE — Discharge Summary (Signed)
Physician Discharge Summary  Patient: Jeffery Sweeney ZOX:[id number] DOB: November 27, 1960   Code Status: Full Code Admit date: 07/05/2021 Discharge date: 07/26/2021 Disposition: Home, No home health services recommended PCP: Iran Ouch, MD  Recommendations for Outpatient Follow-up:  Follow up with PCP within 1-2 weeks Mild hypocalcemia but seems to be persistent- further differentiate Follow up with neurology Indefinite ASA 325mg  and 90 days plavix Consider vestibular rehab if symptoms return  Discharge Diagnoses:  Principal Problem:   Stroke (cerebrum) [facility])  Brief Hospital Course Summary: Pt presented with sensation of dizziness and visual changes. He was found to have an acute left occipital/cerebellum infarction and significant large vessel disease.  Neurology was consulted and evaluated.  His symptoms significantly improved spontaneously.  Treatment involved starting on ASA of 325mg  dose indefinitely and plavix for 90 days.  He received an echo in evaluation which was overall unremarkable and can be seen below.  No PT or OT follow up was recommended.   All other chronic conditions were treated with home medications.   Discharge Condition: Good, improved Recommended discharge diet: Regular healthy diet  Consultations: Neurology   Procedures/Studies: None    Allergies as of 07/26/2021   No Known Allergies      Medication List     STOP taking these medications    HYDROcodone-acetaminophen 5-325 MG tablet Commonly known as: NORCO/VICODIN   ibuprofen 200 MG tablet Commonly known as: ADVIL       TAKE these medications    aspirin EC 325 MG tablet Take 1 tablet (325 mg total) by mouth daily. What changed:  medication strength how much to take additional instructions   clopidogrel 75 MG tablet Commonly known as: Plavix Take 1 tablet (75 mg total) by mouth daily.   meclizine 25 MG tablet Commonly known as: ANTIVERT Take 1 tablet (25 mg total) by  mouth 3 (three) times daily as needed for dizziness.   metoprolol tartrate 25 MG tablet Commonly known as: LOPRESSOR Take 1.5 tablets (37.5 mg total) by mouth 2 (two) times daily.   nitroGLYCERIN 0.4 MG SL tablet Commonly known as: NITROSTAT Place 1 tablet (0.4 mg total) under the tongue every 5 (five) minutes as needed for chest pain.   pantoprazole 40 MG tablet Commonly known as: PROTONIX Take 1 tablet (40 mg total) by mouth once daily.   rosuvastatin 40 MG tablet Commonly known as: CRESTOR Take 1 tablet (40 mg total) by mouth once daily.   silver sulfADIAZINE 1 % cream Commonly known as: SILVADENE Apply to affected area daily         Subjective   Pt reports feeling essentially back to normal. He endorses understanding of his follow up plan. Denies any concerns or more questions at this time.  Objective  Blood pressure 113/68, pulse 68, temperature 98 F (36.7 C), temperature source Oral, resp. rate 15, height 5\' 3"  (1.6 m), weight 86.7 kg, SpO2 96 %.   General: Pt is alert, awake, not in acute distress Cardiovascular: RRR, S1/S2 +, no rubs, no gallops Respiratory: CTA bilaterally, no wheezing, no rhonchi Abdominal: Soft, NT, ND, bowel sounds + Extremities: no edema, no cyanosis   The results of significant diagnostics from this hospitalization (including imaging, microbiology, ancillary and laboratory) are listed below for reference.   Imaging studies: CT ANGIO HEAD NECK W WO CM  Result Date: 07/05/2021 CLINICAL DATA:  Neuro deficit, acute, stroke suspected EXAM: CT ANGIOGRAPHY HEAD AND NECK TECHNIQUE: Multidetector CT imaging of the head and neck was  performed using the standard protocol during bolus administration of intravenous contrast. Multiplanar CT image reconstructions and MIPs were obtained to evaluate the vascular anatomy. Carotid stenosis measurements (when applicable) are obtained utilizing NASCET criteria, using the distal internal carotid diameter as the  denominator. RADIATION DOSE REDUCTION: This exam was performed according to the departmental dose-optimization program which includes automated exposure control, adjustment of the mA and/or kV according to patient size and/or use of iterative reconstruction technique. CONTRAST:  75mL OMNIPAQUE IOHEXOL 350 MG/ML SOLN COMPARISON:  Same day MRI. FINDINGS: CT HEAD FINDINGS Brain: Small infarcts in the left occipital lobe and possibly cerebellum, better characterized on same day MRI. No evidence of acute hemorrhage, hydrocephalus, mass lesion, midline shift, extra-axial fluid collection. Vascular: See below. Skull: No acute fracture. Sinuses: Clear visualized sinuses. Orbits: No acute finding. Review of the MIP images confirms the above findings CTA NECK FINDINGS Aortic arch: Great vessel origins are patent. Right carotid system: No evidence of dissection, stenosis (50% or greater) or occlusion. Mild atherosclerosis at the carotid bifurcation. Left carotid system: No evidence of dissection, stenosis (50% or greater) or occlusion. Mild atherosclerosis at the carotid bifurcation. Vertebral arteries: Moderate stenosis of the right vertebral artery origin. Left vertebral artery is patent without significant stenosis in the neck. Skeleton: Mild-to-moderate multilevel degenerative change in the cervical spine. Other neck: No evidence of acute abnormality on limited assessment. Upper chest: Visualized lung apices are clear. Review of the MIP images confirms the above findings CTA HEAD FINDINGS Anterior circulation: Calcific atherosclerosis of bilateral intracranial ICAs with moderate narrowing bilaterally. Bilateral MCAs and ACAs are patent without proximal hemodynamically significant stenosis. Posterior circulation: Moderate left and severe right proximal intradural vertebral artery stenosis with multifocal atherosclerotic irregularity. Basilar artery is patent without significant stenosis. Bilateral PCAs are patent without  proximal hemodynamically significant stenosis. No aneurysm identified. Venous sinuses: As permitted by contrast timing, patent. Review of the MIP images confirms the above findings IMPRESSION: CT head: Small left occipital and possibly cerebellar infarcts better characterized on same day MRI. CTA head: 1. No large vessel occlusion. 2. 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 3. Moderate bilateral intracranial ICA stenosis. CTA neck: 1. Moderate stenosis of the right vertebral artery origin. 2. Otherwise, no significant (greater than 50%) stenosis in the neck. Electronically Signed   By: Feliberto HartsFrederick S Jones M.D.   On: 07/05/2021 19:36   MR BRAIN WO CONTRAST  Result Date: 07/05/2021 CLINICAL DATA:  Acute onset of severe dizziness. EXAM: MRI HEAD WITHOUT CONTRAST TECHNIQUE: Multiplanar, multiecho pulse sequences of the brain and surrounding structures were obtained without intravenous contrast. COMPARISON:  Head CT 06/28/2020 FINDINGS: The study is intermittently up to moderately motion degraded. Brain: There are small acute to subacute cortical infarcts in the left occipital lobe. A punctate acute or subacute infarct is questioned in the right cerebellar hemisphere although this could be artifact. No intracranial hemorrhage, mass, midline shift, or extra-axial fluid collection is identified. No significant chronic white matter disease is seen for age. The ventricles and sulci are within normal limits for age. Vascular: Major intracranial vascular flow voids are preserved. Skull and upper cervical spine: Unremarkable bone marrow signal. Sinuses/Orbits: Unremarkable orbits. Mild bilateral ethmoid air cell mucosal thickening. Clear mastoid air cells. Other: None. IMPRESSION: Small acute/subacute infarcts in the left occipital lobe and possibly cerebellum. Electronically Signed   By: Sebastian AcheAllen  Grady M.D.   On: 07/05/2021 16:25   VAS US ABI WITH/WO TBI  Result Date: 06/11/2021  LOWER  EXTREMITY  DOPPLER STUDY Patient Name:  Jeffery Sweeney  Date of Exam:   06/07/2021 Medical Rec #: [id number]         Accession #:    [id number] Date of Birth: 1961/01/19         Patient Gender: M Patient Age:   73 years Exam Location:  [place] Procedure:      VAS [place] ABI WITH/WO TBI Referring Phys: [facility] ARIDA --------------------------------------------------------------------------------  Indications: Left leg pain after standing or walking half a block. Resolves              after a lot of rest. Pain begins in his calf and radiates up to the              thigh. This pain is progressive and has been present for about a              year. Right leg is asymptomatic. High Risk Factors: Hypertension, past history of smoking, prior MI, coronary                    artery disease.  Comparison Study: 07/08/20 normal arterial Doppler Performing Technologist: Quentin Ore RVT  Examination Guidelines: A complete evaluation includes at minimum, Doppler waveform signals and systolic blood pressure reading at the level of bilateral brachial, anterior tibial, and posterior tibial arteries, when vessel segments are accessible. Bilateral testing is considered an integral part of a complete examination. Photoelectric Plethysmograph (PPG) waveforms and toe systolic pressure readings are included as required and additional duplex testing as needed. Limited examinations for reoccurring indications may be performed as noted.  ABI Findings: +---------+------------------+-----+---------+--------+  Right     Rt Pressure (mmHg) Index Waveform  Comment   +---------+------------------+-----+---------+--------+  Brachial  136                      triphasic           +---------+------------------+-----+---------+--------+  PTA       167                1.18  triphasic           +---------+------------------+-----+---------+--------+  PERO      163                1.16  triphasic           +---------+------------------+-----+---------+--------+   DP        167                1.18  triphasic           +---------+------------------+-----+---------+--------+  Great Toe 115                0.82  Normal              +---------+------------------+-----+---------+--------+ +---------+------------------+-----+---------+-------+  Left      Lt Pressure (mmHg) Index Waveform  Comment  +---------+------------------+-----+---------+-------+  Brachial  141                      triphasic          +---------+------------------+-----+---------+-------+  PTA       168                1.19  triphasic          +---------+------------------+-----+---------+-------+  PERO      148                1.09  triphasic          +---------+------------------+-----+---------+-------+  DP        155                1.10  triphasic          +---------+------------------+-----+---------+-------+  Great Toe 158                1.12  Normal             +---------+------------------+-----+---------+-------+ +-------+-----------+-----------+------------+------------+  ABI/TBI Today's ABI Today's TBI Previous ABI Previous TBI  +-------+-----------+-----------+------------+------------+  Right   1.18        0.82        1.01                       +-------+-----------+-----------+------------+------------+  Left    1.19        1.12        1.17                       +-------+-----------+-----------+------------+------------+ Bilateral ABIs appear essentially unchanged compared to prior study on 07/08/20.  Summary: Right: Resting right ankle-brachial index is within normal range. No evidence of significant right lower extremity arterial disease. The right toe-brachial index is normal. Left: Resting left ankle-brachial index is within normal range. No evidence of significant left lower extremity arterial disease. The left toe-brachial index is normal.  *See table(s) above for measurements and observations.  Electronically signed by Lorine Bears MD on 06/11/2021 at 8:08:35 AM.    Final    ECHOCARDIOGRAM  COMPLETE  Result Date: 07/06/2021    ECHOCARDIOGRAM REPORT   Patient Name:   Jeffery Sweeney Date of Exam: 07/06/2021 Medical Rec #:  [id number]        Height:       63.0 in Accession #:    [id number]       Weight:       191.1 lb Date of Birth:  December 03, 1960        BSA:          1.897 m Patient Age:    60 years         BP:           131/89 mmHg Patient Gender: M                HR:           77 bpm. Exam Location:  [facility] Procedure: 2D Echo, Cardiac Doppler and Color Doppler Indications:     Stroke I63.9  History:         Patient has prior history of Echocardiogram examinations. Risk                  Factors:Hypertension. MI.  Sonographer:     Neysa Bonito Roar Referring Phys:  [id number] [place] General Diagnosing Phys: Julien Nordmann MD IMPRESSIONS  1. Left ventricular ejection fraction, by estimation, is 60 to 65%. The left ventricle has normal function. The left ventricle has no regional wall motion abnormalities. Left ventricular diastolic parameters are consistent with Grade I diastolic dysfunction (impaired relaxation).  2. Right ventricular systolic function is normal. The right ventricular size is normal. There is normal pulmonary artery systolic pressure. The estimated right ventricular systolic pressure is 26.0 mmHg.  3. The mitral valve is normal in structure. Mild mitral valve regurgitation. No evidence of mitral stenosis.  4. The aortic valve is normal in structure. Aortic valve regurgitation is not visualized. Aortic valve sclerosis is present, with no evidence  of aortic valve stenosis.  5. The inferior vena cava is normal in size with greater than 50% respiratory variability, suggesting right atrial pressure of 3 mmHg. FINDINGS  Left Ventricle: Left ventricular ejection fraction, by estimation, is 60 to 65%. The left ventricle has normal function. The left ventricle has no regional wall motion abnormalities. The left ventricular internal cavity size was normal in size. There is  no left ventricular hypertrophy.  Left ventricular diastolic parameters are consistent with Grade I diastolic dysfunction (impaired relaxation). Right Ventricle: The right ventricular size is normal. No increase in right ventricular wall thickness. Right ventricular systolic function is normal. There is normal pulmonary artery systolic pressure. The tricuspid regurgitant velocity is 2.29 m/s, and  with an assumed right atrial pressure of 5 mmHg, the estimated right ventricular systolic pressure is 26.0 mmHg. Left Atrium: Left atrial size was normal in size. Right Atrium: Right atrial size was normal in size. Pericardium: There is no evidence of pericardial effusion. Mitral Valve: The mitral valve is normal in structure. Mild mitral valve regurgitation. No evidence of mitral valve stenosis. Tricuspid Valve: The tricuspid valve is normal in structure. Tricuspid valve regurgitation is mild . No evidence of tricuspid stenosis. Aortic Valve: The aortic valve is normal in structure. Aortic valve regurgitation is not visualized. Aortic valve sclerosis is present, with no evidence of aortic valve stenosis. Aortic valve peak gradient measures 6.7 mmHg. Pulmonic Valve: The pulmonic valve was normal in structure. Pulmonic valve regurgitation is mild. No evidence of pulmonic stenosis. Aorta: The aortic root is normal in size and structure. Venous: The inferior vena cava is normal in size with greater than 50% respiratory variability, suggesting right atrial pressure of 3 mmHg. IAS/Shunts: No atrial level shunt detected by color flow Doppler.  LEFT VENTRICLE PLAX 2D LVIDd:         4.42 cm   Diastology LVIDs:         3.04 cm   LV e' medial:    7.29 cm/s LV PW:         1.10 cm   LV E/e' medial:  8.5 LV IVS:        1.10 cm   LV e' lateral:   10.00 cm/s LVOT diam:     1.80 cm   LV E/e' lateral: 6.2 LVOT Area:     2.54 cm  RIGHT VENTRICLE RV Mid diam:    2.61 cm RV S prime:     10.80 cm/s TAPSE (M-mode): 1.8 cm LEFT ATRIUM             Index        RIGHT ATRIUM            Index LA diam:        3.70 cm 1.95 cm/m   RA Area:     15.70 cm LA Vol (A2C):   40.4 ml 21.30 ml/m  RA Volume:   40.40 ml  21.30 ml/m LA Vol (A4C):   44.7 ml 23.57 ml/m LA Biplane Vol: 42.4 ml 22.35 ml/m  AORTIC VALVE                 PULMONIC VALVE AV Area (Vmax): 1.52 cm     PV Vmax:          1.40 m/s AV Vmax:        129.00 cm/s  PV Peak grad:     7.8 mmHg AV Peak Grad:   6.7 mmHg     PR End Diast Vel:  5.95 msec LVOT Vmax:      77.10 cm/s   RVOT Peak grad:   2 mmHg  AORTA Ao Root diam: 2.50 cm Ao Asc diam:  2.60 cm MITRAL VALVE               TRICUSPID VALVE MV Area (PHT): 3.61 cm    TR Peak grad:   21.0 mmHg MV Decel Time: 210 msec    TR Vmax:        229.00 cm/s MV E velocity: 61.70 cm/s MV A velocity: 62.10 cm/s  SHUNTS MV E/A ratio:  0.99        Systemic Diam: 1.80 cm MV A Prime:    13.9 cm/s Julien Nordmann MD Electronically signed by Julien Nordmann MD Signature Date/Time: 07/06/2021/2:29:49 PM    Final     Labs: Basic Metabolic Panel: Recent Labs  Lab 07/05/21 1143  NA 137  K 3.9  CL 109  CO2 24  GLUCOSE 93  BUN 15  CREATININE 1.21  CALCIUM 8.7*   CBC: Recent Labs  Lab 07/05/21 1143  WBC 7.6  HGB 14.7  HCT 45.0  MCV 87.9  PLT 222   Microbiology: none  Time coordinating discharge: Over 30 minutes  [name], MD  [facility] 07/06/2021, 4:10 PM

## 2021-07-06 NOTE — Progress Notes (Signed)
° ° °  Jeffery Sweeney- 61 y.o. male  Admitted 07/05/2021  May return to work on next scheduled shift.   [name], DO [facility] 07/06/2021, 4:31 PM

## 2021-07-06 NOTE — Evaluation (Signed)
Occupational Therapy Evaluation Patient Details Name: Jeffery Sweeney MRN: [id number] DOB: 1960-10-22 Today's Date: 07/06/2021   History of Present Illness Jeffery Sweeney is a 61 y.o. male with medical history significant of CAD/NSTEMI with CABG, HTN, HLD, ex smoker, presented with new onset of vertigo.  Found to have L occipital/cerebellar infarct.   Clinical Impression   Pt seen for OT evaluation this date.  Pt reports dizziness has subsided and pt feels back to baseline.  Pt presents with good/equal  BUE strength.  Able to perform all functional transfers this day without AD.  Demonstrated reaching outside BOS in sitting without LOB and tolerated standing at sink to perform grooming ADLs in standing.  No use of grab bar or bed rail to perform transfers this morning with good stability noted.  OT educated and reviewed FAST acronym of stroke symptoms and provided written notes for pt who verbalized understanding.  No additional OT follow up needed in the acute setting or upon d/c.  No AE needed.  Pt left sitting up in chair with breakfast tray.  Nursing in agreement of no need for chair alarm.  All items within reach.       Recommendations for follow up therapy are one component of a multi-disciplinary discharge planning process, led by the attending physician.  Recommendations may be updated based on patient status, additional functional criteria and insurance authorization.   Follow Up Recommendations  No OT follow up    Assistance Recommended at Discharge None  Patient can return home with the following      Functional Status Assessment  Patient has not had a recent decline in their functional status  Equipment Recommendations  Other (comment) (none needed)           Precautions / Restrictions Precautions Precautions: None Precaution Comments: did not require walker and pt reports dizziness has subsided Restrictions Weight Bearing Restrictions: No      Mobility Bed  Mobility Overal bed mobility: Independent             General bed mobility comments: no use of rail Patient Response: Cooperative  Transfers Overall transfer level: Independent                 General transfer comment: transferred sit<>stand from toilet without use of grab bar      Balance Overall balance assessment: No apparent balance deficits (not formally assessed)                                         ADL either performed or assessed with clinical judgement   ADL Overall ADL's : Independent                                       General ADL Comments: able to sit EOB and reach outside BOS without LOB, complete standing ADLs at sink, toilet transfer indep, bed mobility indep     Vision Baseline Vision/History: 1 Wears glasses Patient Visual Report: No change from baseline       Perception     Praxis Praxis Praxis: Intact    Pertinent Vitals/Pain Pain Assessment Pain Assessment: No/denies pain     Hand Dominance Right   Extremity/Trunk Assessment Upper Extremity Assessment Upper Extremity Assessment: Overall WFL for tasks assessed   Lower Extremity Assessment Lower  Extremity Assessment: Defer to PT evaluation   Cervical / Trunk Assessment Cervical / Trunk Assessment: Normal   Communication Communication Communication: No difficulties   Cognition Arousal/Alertness: Awake/alert Behavior During Therapy: WFL for tasks assessed/performed Overall Cognitive Status: Within Functional Limits for tasks assessed                                 General Comments: A&Ox4     General Comments                  Home Living Family/patient expects to be discharged to:: Private residence Living Arrangements: Alone   Type of Home: House Home Access: Level entry     Home Layout: Multi-level (pt resides in a room on the 2nd floor.) Alternate Level Stairs-Number of Steps: 1 flight of stairs to pt's  room with 1 rail.  level entry into the house.   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                      OT Goals(Current goals can be found in the care plan section) Acute Rehab OT Goals Patient Stated Goal: To go home today and return to work on Monday OT Goal Formulation: With patient Time For Goal Achievement: 07/20/21 Potential to Achieve Goals: Good  OT Frequency:                    AM-PAC OT "6 Clicks" Daily Activity     Outcome Measure Help from another person eating meals?: None Help from another person taking care of personal grooming?: None Help from another person toileting, which includes using toliet, bedpan, or urinal?: None Help from another person bathing (including washing, rinsing, drying)?: None Help from another person to put on and taking off regular upper body clothing?: None Help from another person to put on and taking off regular lower body clothing?: None 6 Click Score: 24   End of Session Equipment Utilized During Treatment: Gait belt Nurse Communication: Mobility status  Activity Tolerance: Patient tolerated treatment well Patient left: in chair;with call bell/phone within reach  OT Visit Diagnosis: Dizziness and giddiness (R42)                Time: [id number] OT Time Calculation (min): 28 min Charges:  OT General Charges $OT Visit: 1 Visit OT Evaluation $OT Eval Low Complexity: 1 Low OT Treatments $Self Care/Home  Management : 8-22 mins  Leta Speller, MS, OTR/L   Darleene Cleaver 07/06/2021, 8:56 AM

## 2021-07-06 NOTE — Consult Note (Addendum)
Neurology Stroke Consult H&P  Jeffery Sweeney MR# [id number] 07/06/2021   CC: dizziness and imbalance  History is obtained from: Patient, Dr, Ouida Sills and chart.  HPI: Jeffery Sweeney is a 61 y.o. male PMHx as reviewed below was working at [employer] where he has worked for 23 years and suddenly developed sensation of feeling like everything was spinning and he had to hold on to the counter. There was associated headache, tongue feeling numb.  The following information was taken from ED note 07/05/2021  "According to the patient several hours ago while working he developed acute onset of severe dizziness.  States he remembers turning around and then feeling significant dizziness as if the room is spinning around him.  Became very nauseated.  Patient denies any history of vertigo in the past.  Denies any weakness numbness or headache at any point.  Patient did however state during the more significant part of the dizziness he felt like his tongue was tingling.  No history of CVA previously.  Patient states his symptoms have diminished significantly as long as he is lying still in bed."  ASA 324 mg given in ED  He has never felt this before, there was no passing out  LKW: unclear tNK given: No OSW IR Thrombectomy No, not indicated Modified Rankin Scale: 0-Completely asymptomatic and back to baseline post- stroke NIHSS: 0  ROS: A complete ROS was performed and is negative except as noted in the HPI.   Past Medical History:  Diagnosis Date   Hypertension    Multiple vessel coronary artery disease    Myocardial infarct (HCC)    x 3; PCI with single stent placement for each (in [place])   NSTEMI (non-ST elevated myocardial infarction) ([place])    [place] 2/18 with multivessel CAD and transfer to [facility] for CABG eval   Tobacco use    2 cigarettes daily   Diabetes runs in his father's family Heart disease runs in his mother's family  Social History:  reports that he quit smoking about a year  ago. His smoking use included cigarettes. He smoked an average of .5 packs per day. He has never used smokeless tobacco. He reports that he does not currently use alcohol. He reports that he does not currently use drugs.   Prior to Admission medications   Medication Sig Start Date End Date Taking? Authorizing Provider  meclizine (ANTIVERT) 25 MG tablet Take 1 tablet (25 mg total) by mouth 3 (three) times daily as needed for dizziness. 07/05/21  Yes Harvest Dark, MD  metoprolol tartrate (LOPRESSOR) 25 MG tablet Take 1.5 tablets (37.5 mg total) by mouth 2 (two) times daily. 04/03/21 07/05/21 Yes Wellington Hampshire, MD  pantoprazole (PROTONIX) 40 MG tablet Take 1 tablet (40 mg total) by mouth once daily. 04/03/21 07/05/21 Yes Wellington Hampshire, MD  rosuvastatin (CRESTOR) 40 MG tablet Take 1 tablet (40 mg total) by mouth once daily. 04/03/21 07/05/21 Yes Wellington Hampshire, MD  aspirin EC 81 MG tablet Take 81 mg by mouth daily. Swallow whole.    [provider]  HYDROcodone-acetaminophen (NORCO/VICODIN) 5-325 MG tablet Take 1 tablet by mouth every 6 (six) hours as needed for severe pain. Patient not taking: Reported on 07/05/2021 10/10/20 10/10/21  Duffy Bruce, MD  ibuprofen (ADVIL) 200 MG tablet Take 200 mg by mouth every 6 (six) hours as needed.    [provider]  nitroGLYCERIN (NITROSTAT) 0.4 MG SL tablet Place 1 tablet (0.4 mg total) under the tongue every 5 (  five) minutes as needed for chest pain. 10/05/20   Marrianne Mood D, PA-C  silver sulfADIAZINE (SILVADENE) 1 % cream Apply to affected area daily 12/16/20 12/16/21  Sherrie George B, FNP    Exam: Current vital signs: BP 128/78 (BP Location: Left Arm)    Pulse 62    Temp 97.7 F (36.5 C)    Resp 19    Ht 5\' 3"  (1.6 m)    Wt 86.7 kg    SpO2 96%    BMI 33.86 kg/m   Physical Exam  Constitutional: Sitting up in bed eating. Appears well-developed and well-nourished.  Psych: Affect appropriate to situation Eyes: No scleral  injection HENT: No OP obstruction. Head: Normocephalic.  Cardiovascular: Normal rate and regular rhythm.  Respiratory: Effort normal, symmetric excursions bilaterally, no audible wheezing. GI: Soft.  No distension. There is no tenderness.  Skin: WDI  Neuro: Mental Status: Patient is awake, alert, oriented to person, place, month, year, and situation. Patient is able to give a clear and coherent history. Speech fluent, intact comprehension and repetition. No signs of aphasia or neglect. Visual Fields are full. Pupils are equal, round, and reactive to light. EOMI without ptosis or diplopia.  Facial sensation is symmetric to temperature Facial movement is symmetric.  Hearing is intact to voice. Uvula midline and palate elevates symmetrically. Shoulder shrug is symmetric. Tongue is midline without atrophy or fasciculations.  Tone is normal. Bulk is normal. 5/5 strength was present in all four extremities. Sensation is symmetric to light touch and temperature in the arms and legs. Deep Tendon Reflexes: 2+ and symmetric in the biceps and patellae. Toes are downgoing bilaterally. FNF and HKS are intact bilaterally. Gait - Deferred  I have reviewed labs in epic and the pertinent results are: Lab Results  Component Value Date   HGBA1C 5.7 (H) 07/08/2020   and  Lab Results  Component Value Date   [place] 80 07/06/2021   I have reviewed the images obtained: MRI brain showed acute - subacute small cortical infarcts in left occipital lobe, punctate acute or subacute infarct in right cerebellar hemisphere. No intracranial hemorrhage or mass.  CTA head and neck showed moderate bilateral intracranial ICA stenosis, moderate left and severe right proximal intradural vertebral artery stenosis with multifocal atherosclerotic irregularity. Moderate stenosis of the right vertebral artery origin.  Assessment: Jeffery Sweeney is a 61 y.o. male PMHx as noted above, significant vascular risk  factors with acute large vessel embolic strokes. Vessel imaging showed extra and intracranial artery stenoses (right V4) and will need full dose aspirin as part of his aggressive medical management. We discussed diet, exercise and the impact of smoking on health especially vascular system. He is no longer dizzy and he will not need vestibular rehabilitation.  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 - Continue statin for goal LDL <70. - A1c 5.7 at goal <7. - Aspirin 325mg  daily indefinitely. - Clopidogrel 75mg  daily for 3 months. - BP goal <140/90. - Encouraged him not to smoke. - Telemetry monitoring for arrhythmia. - Continue stroke education. - Stroke workup is otherwise complete. - Follow up with outpatient neurology. - Neurology will remain available, please call for questions.   Electronically signed by:  Lynnae Sandhoff, MD Page: [id number] 07/06/2021, 9:57 AM  If 7pm- 7am, please page neurology on call as listed in [place].

## 2021-07-06 NOTE — Progress Notes (Signed)
SLP Cancellation Note  Patient Details Name: Jeffery Sweeney MRN: [id number] DOB: Jun 02, 1960   Cancelled treatment:       Reason Eval/Treat Not Completed: SLP screened, no needs identified, will sign off  Bryanna Yim B. Dreama Saa M.S., CCC-SLP, [facility] Speech-Language Pathologist Rehabilitation Services Office [phone]  Reuel Derby 07/06/2021, 11:56 AM

## 2021-07-06 NOTE — Evaluation (Signed)
Physical Therapy Evaluation Patient Details Name: Jeffery Sweeney MRN: [id number] DOB: 1960/06/28 Today's Date: 07/06/2021  History of Present Illness  Patient is a 61 year old male admitted after feeling spinning like sensation while at work. He had similiar symptoms a couple months ago which only lasted one hour unlike these that persisted. MRI showed small acute subacute infarct in the left occipital lobe and possible cerebellum. PMH includes CAD/NSTEMI with CABG, HTN, HLD.   Clinical Impression  Patient is a very pleasant 61 year old male who presents s/p small acute subacute infarct in left occipital lobe. Prior to hospital admission, pt was independent and lives in a multilevel home that has multiple residents. He works at [employer]. Patient is sitting in chair upon PT entering room and agreeable to participate with therapy. He transfers independently and ambulated a lap around the nursing station with a functional gait pattern and ambulation speed of 1.2 m/s. Patient is at his baseline and  PT will sign off on patient. If status changes we will be happy to see the patient as needed.         Recommendations for follow up therapy are one component of a multi-disciplinary discharge planning process, led by the attending physician.  Recommendations may be updated based on patient status, additional functional criteria and insurance authorization.  Follow Up Recommendations No PT follow up    Assistance Recommended at Discharge None  Patient can return home with the following       Equipment Recommendations None recommended by PT  Recommendations for Other Services       Functional Status Assessment Patient has not had a recent decline in their functional status     Precautions / Restrictions Precautions Precautions: None Precaution Comments: returned to baseline Restrictions Weight Bearing Restrictions: No      Mobility  Bed Mobility               General bed mobility  comments: in chair upon PT entering room    Transfers Overall transfer level: Independent                 General transfer comment: STS without UE support    Ambulation/Gait Ambulation/Gait assistance: Independent Gait Distance (Feet): 180 Feet Assistive device: None Gait Pattern/deviations: WFL(Within Functional Limits) Gait velocity: 1.2 m/s     General Gait Details: Patient gait is [facility]  Information systems manager Rankin (Stroke Patients Only)       Balance Overall balance assessment: Independent (able to perform standing reach, head turns, etc without LOB)                                           Pertinent Vitals/Pain Pain Assessment Pain Assessment: No/denies pain    Home Living Family/patient expects to be discharged to:: Private residence Living Arrangements: Other (Comment) (other individuals live in boarding house)   Type of Home: House Home Access: Stairs to enter Entrance Stairs-Rails: Right Entrance Stairs-Number of Steps: 3 Alternate Level Stairs-Number of Steps: 1 flight of stairs to pt's room with 1 rail. 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     Hand Dominance   Dominant Hand: Right    Extremity/Trunk Assessment   Upper Extremity Assessment Upper Extremity Assessment: Defer to OT evaluation    Lower Extremity Assessment Lower  Extremity Assessment: Overall WFL for tasks assessed (grossly 4+/5 bilaterally)    Cervical / Trunk Assessment Cervical / Trunk Assessment: Normal  Communication   Communication: No difficulties  Cognition Arousal/Alertness: Awake/alert Behavior During Therapy: WFL for tasks assessed/performed Overall Cognitive Status: Within Functional Limits for tasks assessed                                 General Comments: A&Ox4        General Comments General comments (skin integrity, edema, etc.): Patient appears well grommed and nourished    Exercises Other Exercises Other Exercises: Patient educated on role of PT in acute care setting, safe mobility and transfers.   Assessment/Plan    PT Assessment Patient does not need any further PT services  PT Problem List         PT Treatment Interventions      PT Goals (Current goals can be found in the Care Plan section)  Acute Rehab PT Goals Patient Stated Goal: return home PT Goal Formulation: With patient Time For Goal Achievement: 07/20/21 Potential to Achieve Goals: Good    Frequency       Co-evaluation               AM-PAC PT "6 Clicks" Mobility  Outcome Measure Help needed turning from your back to your side while in a flat bed without using bedrails?: None Help needed moving from lying on your back to sitting on the side of a flat bed without using bedrails?: None Help needed moving to and from a bed to a chair (including a wheelchair)?: None Help needed standing up from a chair using your arms (e.g., wheelchair or bedside chair)?: None Help needed to walk in hospital room?: None Help needed climbing 3-5 steps with a railing? : None 6 Click Score: 24    End of Session Equipment Utilized During Treatment: Gait belt Activity Tolerance: Patient tolerated treatment well Patient left: in chair Nurse Communication: Mobility status PT Visit Diagnosis: Muscle weakness (generalized) (M62.81)    Time:  6283-1517 PT Time Calculation (min) (ACUTE ONLY): 14 min   Charges:   PT Evaluation $PT Eval Low Complexity: 1 Low PT Treatments $Therapeutic Activity: 8-22 mins      Precious Bard, PT, DPT  07/06/2021, 11:13 AM

## 2021-07-06 NOTE — TOC CM/SW Note (Addendum)
Spoke to patient. Patient DC home today.  Patient stated his PCP is his Cardiologist Dr. Fletcher Anon. Pharmacy is [facility]. Sometimes has issues affording his medications as he is uninsured. Explained Medication Management resource and printed application to be included in DC packet.  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 Patient stated his son lives out of town and sister is unable to transport. No money for a taxi/[employer]. Gavin Potters arranged for 5:15 per RN request. Left driver instructions for driver to call unit 5 minutes prior to arrival to [facility].   Oleh Genin, [place]

## 2021-07-06 NOTE — Progress Notes (Signed)
*  PRELIMINARY RESULTS* Echocardiogram 2D Echocardiogram has been performed.  Jeffery Sweeney 07/06/2021, 11:54 AM

## 2021-07-07 ENCOUNTER — Other Ambulatory Visit: Payer: Self-pay | Admitting: Cardiovascular Disease

## 2021-07-08 ENCOUNTER — Other Ambulatory Visit: Payer: Self-pay

## 2021-07-08 MED ORDER — PANTOPRAZOLE SODIUM 40 MG PO TBEC: 40.0000 mg | DELAYED_RELEASE_TABLET | Freq: Every day | ORAL | 0 refills | Status: DC

## 2021-07-08 MED ORDER — ROSUVASTATIN CALCIUM 40 MG PO TABS: 40.0000 mg | ORAL_TABLET | Freq: Every day | ORAL | 0 refills | Status: DC

## 2021-07-08 MED ORDER — METOPROLOL TARTRATE 25 MG PO TABS: 37.5000 mg | ORAL_TABLET | Freq: Two times a day (BID) | ORAL | 0 refills | Status: DC

## 2021-07-08 NOTE — Telephone Encounter (Signed)
*  STAT* If patient is at the pharmacy, call can be transferred to refill team.   1. Which medications need to be refilled? (please list name of each medication and dose if known) Metoprolol, Protonix, Crestor  2. Which pharmacy/location (including street and city if local pharmacy) is medication to be sent to? [facility]  [address]  3. Do they need a 30 day or 90 day supply? 30

## 2021-07-08 NOTE — Telephone Encounter (Signed)
Reviewed the patient's chart to follow up on his ER evaluation from Friday 07/05/21.  Per Neurology consult note 07/06/21: MRI brain showed acute - subacute small cortical infarcts in left occipital lobe, punctate acute or subacute infarct in right cerebellar hemisphere. No intracranial hemorrhage or mass.  CTA head and neck showed moderate bilateral intracranial ICA stenosis, moderate left and severe right proximal intradural vertebral artery stenosis with multifocal atherosclerotic irregularity. Moderate stenosis of the right vertebral artery origin.  Will update Dr. Kirke Corin (FYI only).

## 2021-08-05 ENCOUNTER — Other Ambulatory Visit: Payer: Self-pay

## 2021-08-05 NOTE — Telephone Encounter (Signed)
*  STAT* If patient is at the pharmacy, call can be transferred to refill team.   1. Which medications need to be refilled? (please list name of each medication and dose if known) \ Meclizine  2. Which pharmacy/location (including street and city if local pharmacy) is medication to be sent to? [facility] [address]  3. Do they need a 30 day or 90 day supply? [place]

## 2021-08-05 NOTE — Telephone Encounter (Signed)
Called and spoke with patient in reference to refill request for Meclizine. He is not sure who originally prescribed this medication for him. Per patient he was told to contact his PCP, which Dr. Kirke Corin is listed as his PCP. Please follow up with patient, thank you. ?

## 2021-08-16 IMAGING — DX DG CHEST 2V
2 series · 2 of 2 positions shown · non-contrast
Comparison: Chest radiograph dated 07/05/2020.

CLINICAL DATA: 59-year-old male with preop chest radiograph.

EXAM:
CHEST - 2 VIEW

[w chest pa]
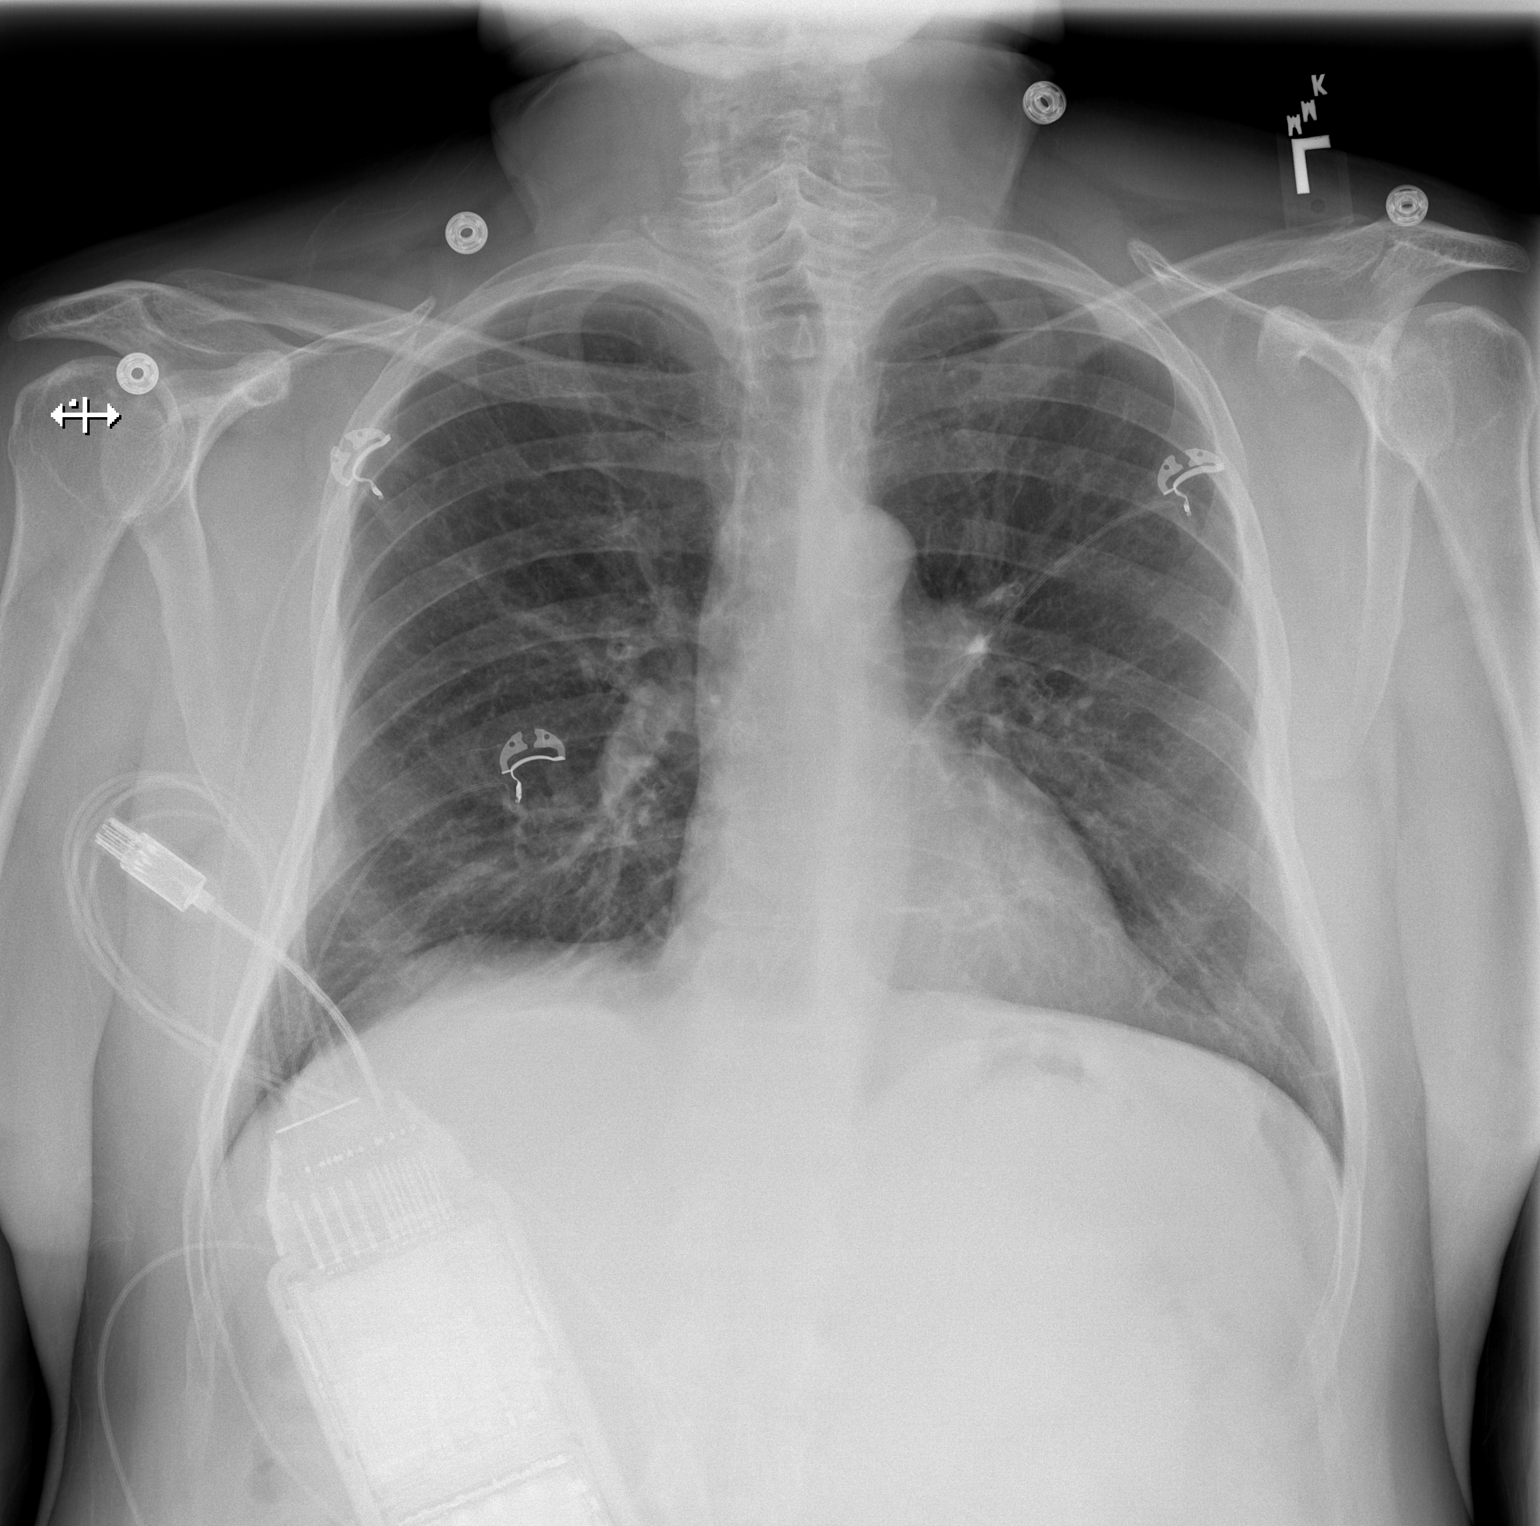

[w chest lat]
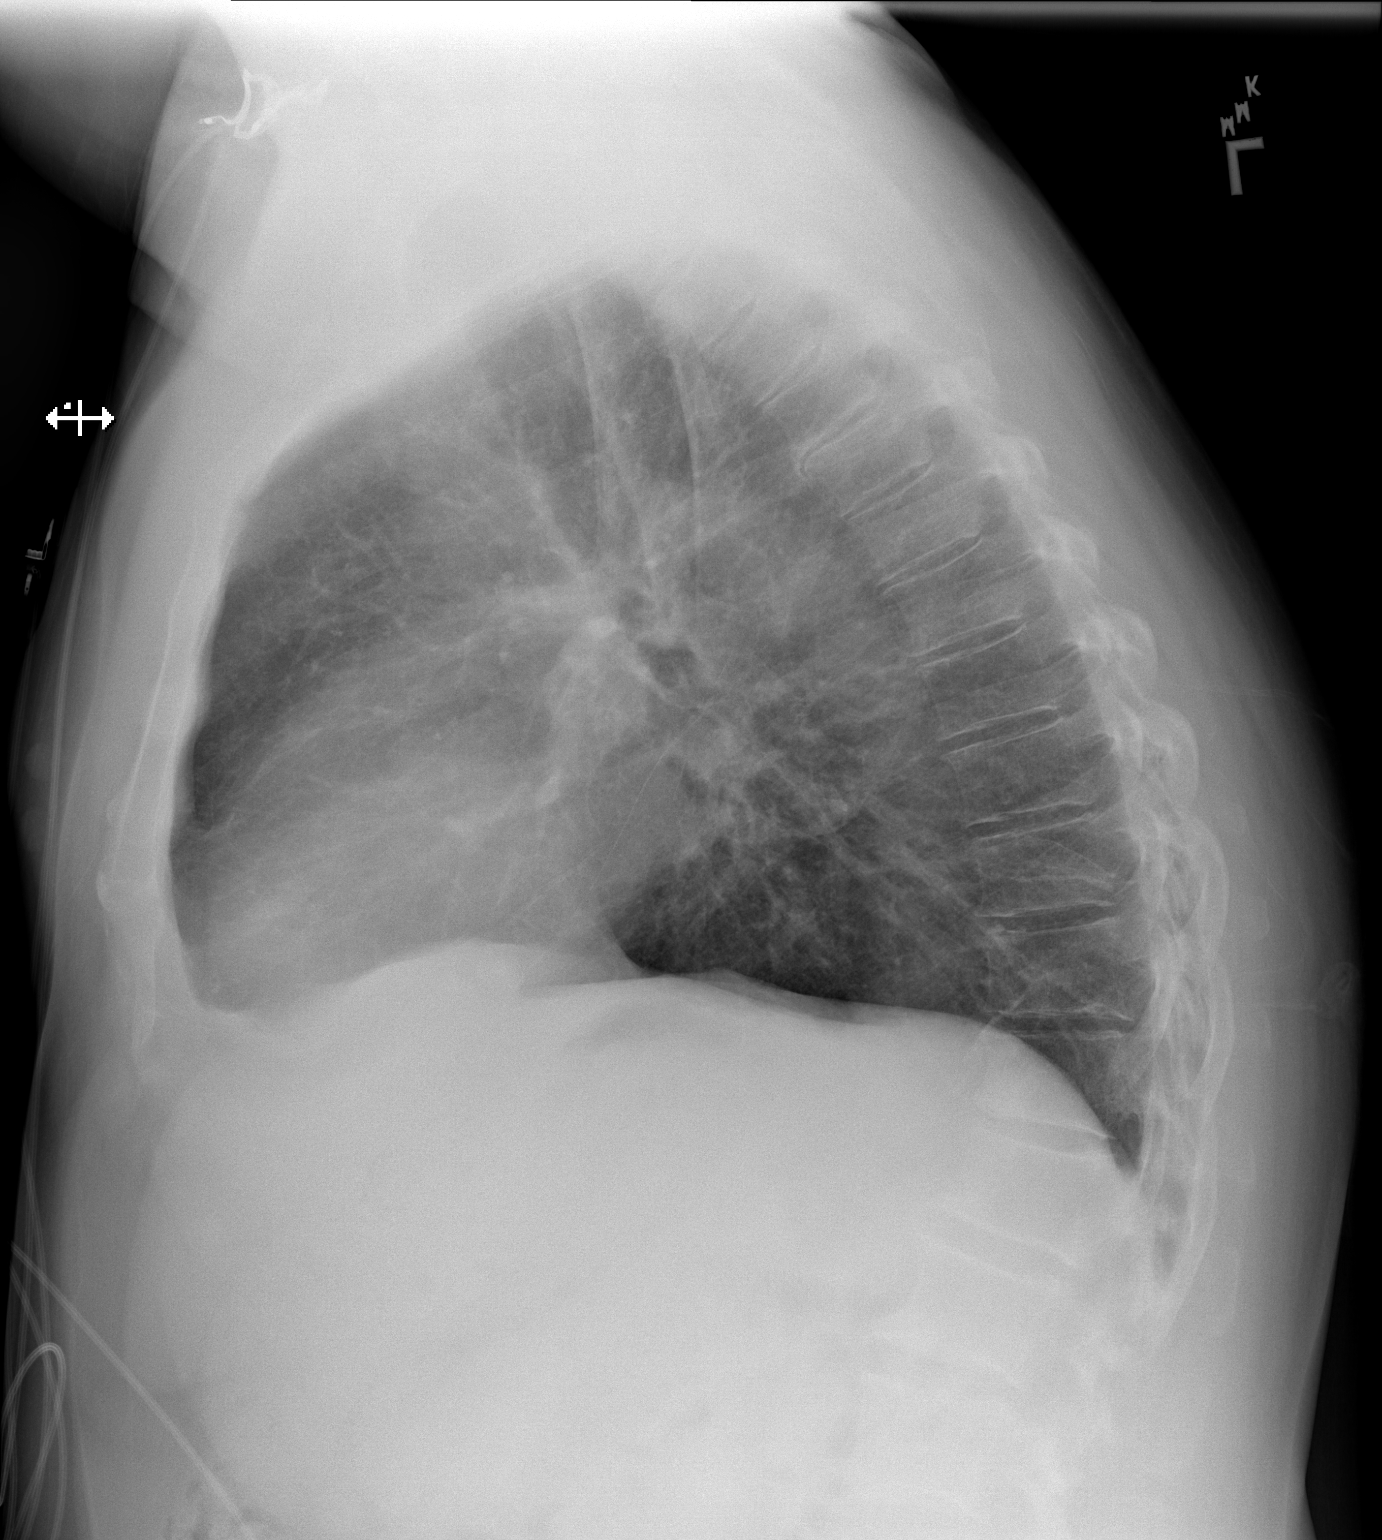

[2 of 2 positions shown; findings below may reference images not displayed]

FINDINGS: The lungs are clear. There is no pleural effusion pneumothorax. The
cardiac silhouette is within limits. No acute osseous pathology.
IMPRESSION: No active cardiopulmonary disease.

## 2021-08-17 IMAGING — DX DG CHEST 1V PORT
1 series · 1 of 1 positions shown · non-contrast
Comparison: July 08, 2020.

CLINICAL DATA: Status post coronary bypass graft.

EXAM:
PORTABLE CHEST 1 VIEW

[chest ap]
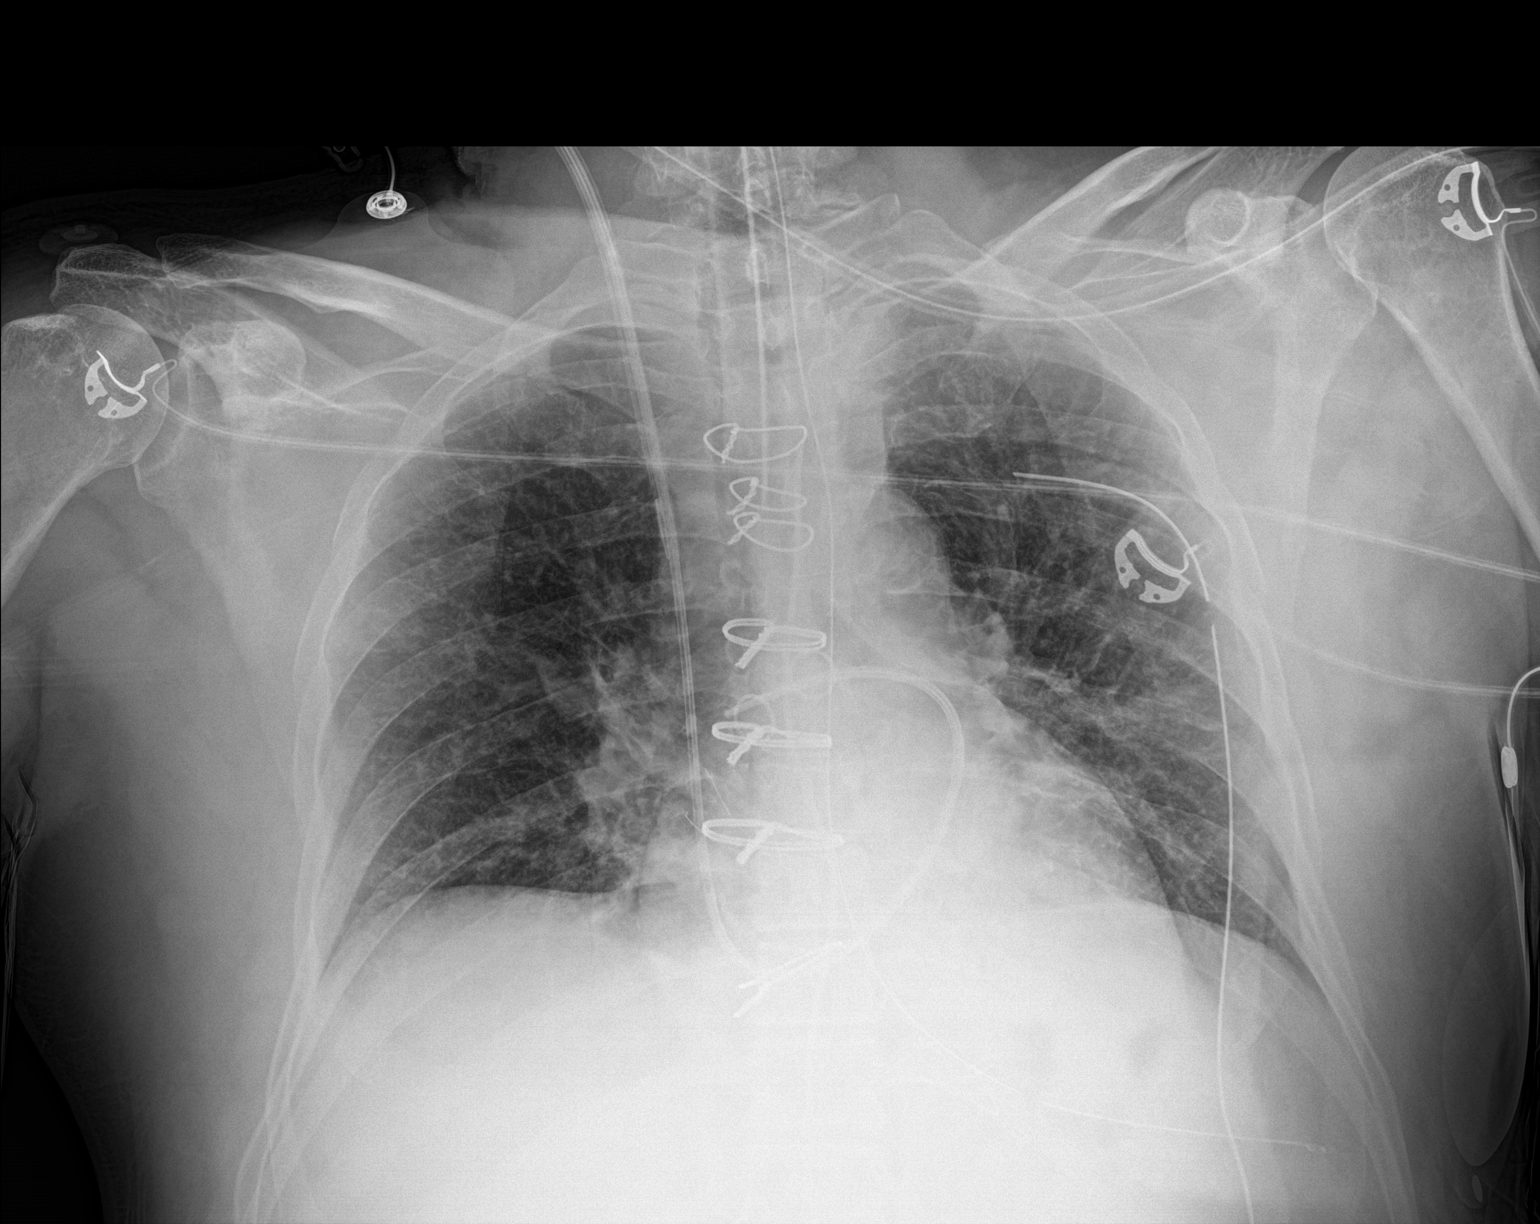

[1 of 1 positions shown; findings below may reference images not displayed]

FINDINGS: The heart size and mediastinal contours are within normal limits.
Endotracheal and nasogastric tubes appear to be in good position.
Right internal jugular Swan-Ganz catheter is noted with tip directed
toward right pulmonary artery. Left-sided chest tube is noted
without pneumothorax. Minimal bibasilar subsegmental atelectasis is
noted. The visualized skeletal structures are unremarkable.
IMPRESSION: Endotracheal and nasogastric tubes in good position. Left-sided
chest tube is noted without pneumothorax. Minimal bibasilar
subsegmental atelectasis.

## 2021-09-17 IMAGING — CR DG CHEST 2V
2 series · 2 of 2 positions shown · non-contrast
Comparison: 07/13/2020.

CLINICAL DATA: Status post CABG.

EXAM:
CHEST - 2 VIEW

[w chest pa]
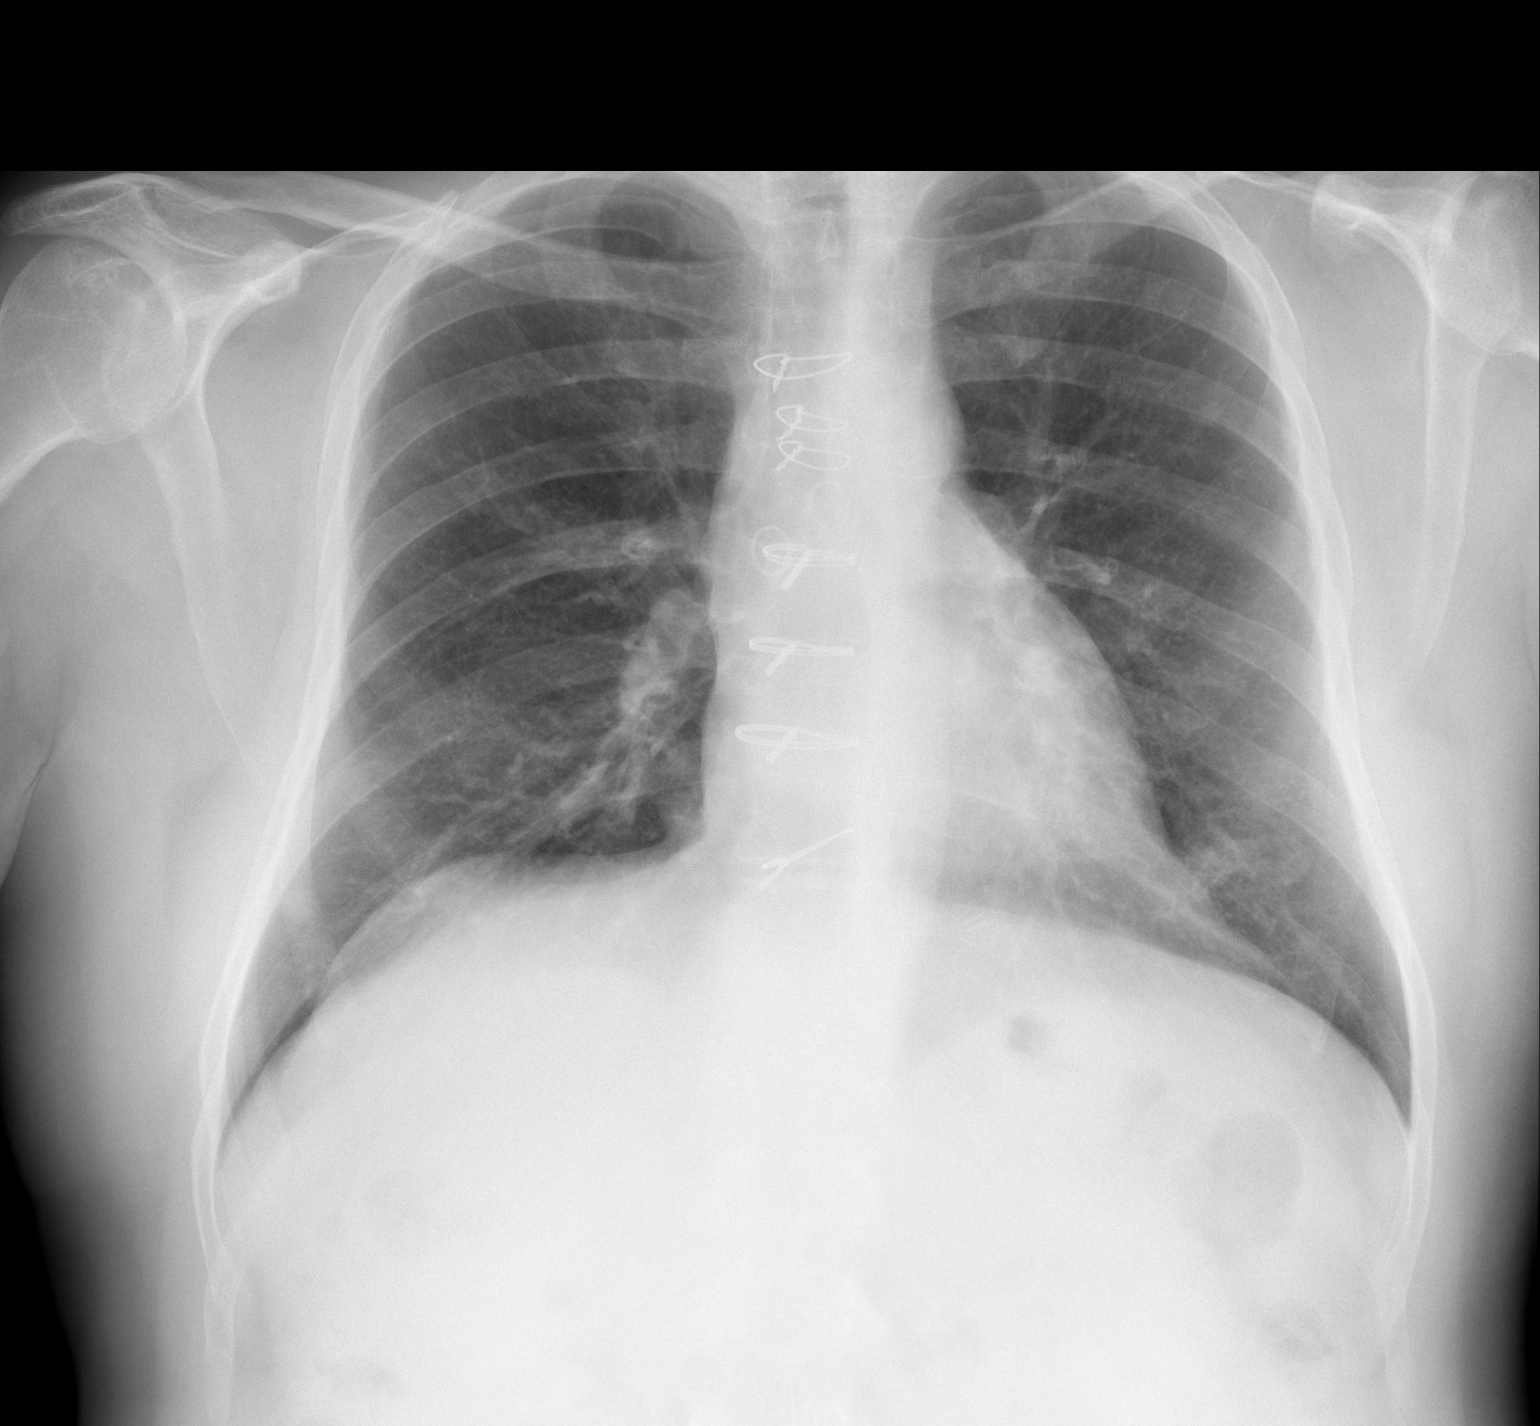

[w chest lat]
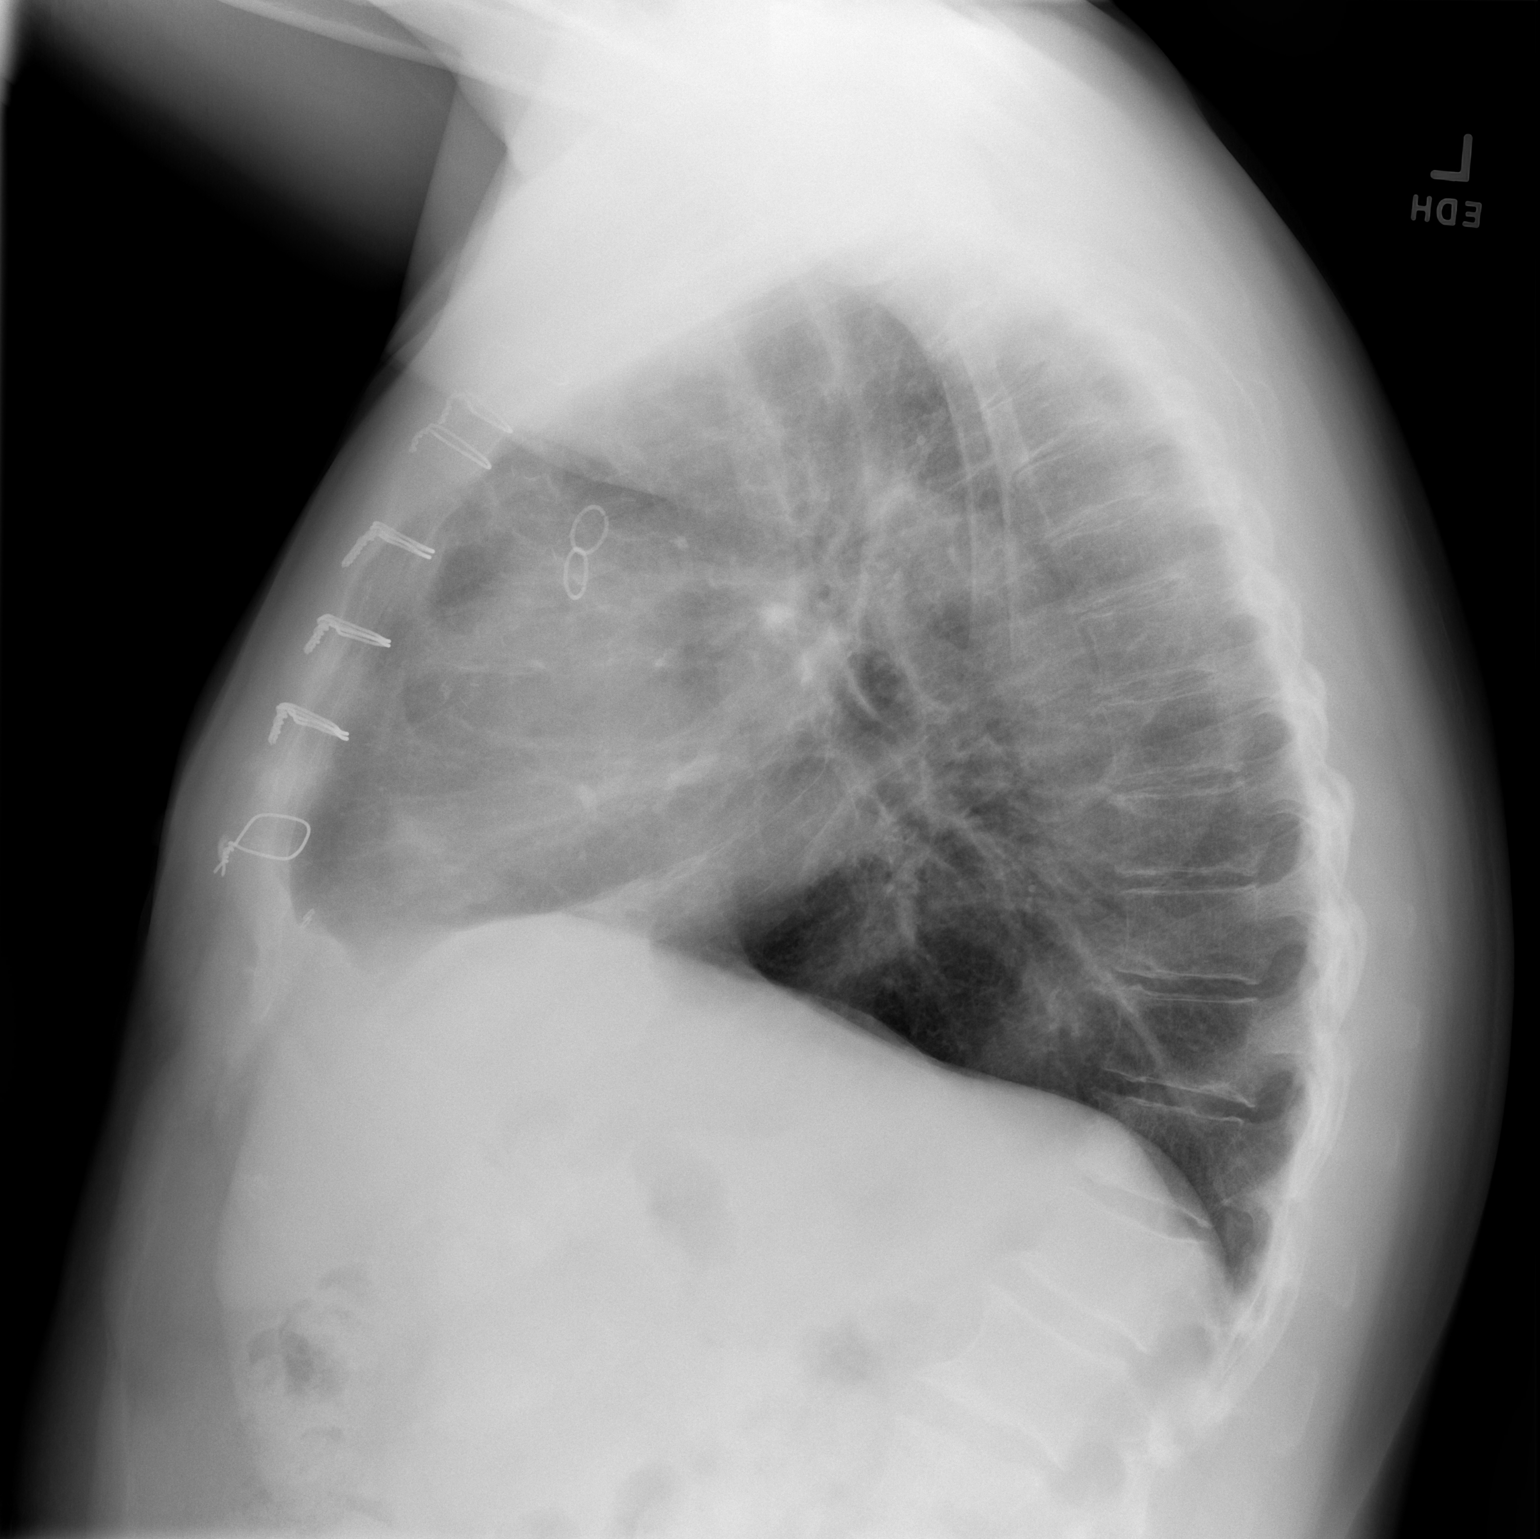

[2 of 2 positions shown; findings below may reference images not displayed]

FINDINGS: Mediastinum and hilar structures normal. Prior CABG. Heart size
normal. Mild left base subsegmental atelectasis and or scarring. No
pleural effusion or pneumothorax. No acute bony abnormality.
IMPRESSION: 1. Prior CABG. Heart size normal.
2. Mild left base subsegmental atelectasis and or scarring.

## 2021-09-19 ENCOUNTER — Telehealth: Payer: Self-pay | Admitting: Nurse Practitioner

## 2021-09-19 NOTE — Telephone Encounter (Signed)
Pt c/o BP issue: STAT if pt c/o blurred vision, one-sided weakness or slurred speech ? ?1. What are your last 5 BP readings? 138/79; 67; 149/86; 157/85; 152/94; 160/99;  ? ?2. Are you having any other symptoms (ex. Dizziness, headache, blurred vision, passed out)? headache ? ?3. What is your BP issue? Patient called ambulance early this morning due to chest pain.   ?

## 2021-09-19 NOTE — Telephone Encounter (Addendum)
Spoke with the patient. ?Patient is currently asymptomatic. He sts that his chest is a little sore. ?He awoke this morning with chest pain. He called EMS and took a 325 mg Asa and Nitro which did not relieve the discomfort. EMS came out to his home. He was not transported to the Hospital. ?He has been moving furniture around in his house the last couple of days. He is unsure if this is the cause of his chest discomfort. He has not taken anything for pain. ?His BP readings have been elevated. ?Patient has an appt scheduled in June. Adv the patient that I would recommend a sooner appt. ? ?Appt scheduled for tomorrow 09/20/21 @ 9:15 with Cadence Fransico Michael, PA. ?Patient given ED precautions. ?Patient verbalized understanding. ? ?

## 2021-09-20 ENCOUNTER — Ambulatory Visit (INDEPENDENT_AMBULATORY_CARE_PROVIDER_SITE_OTHER): Payer: Self-pay | Admitting: Medical

## 2021-09-20 ENCOUNTER — Other Ambulatory Visit
Admission: RE | Admit: 2021-09-20 | Discharge: 2021-09-20 | Disposition: A | Discharge status: Home / Self Care | Payer: Self-pay | Source: Ambulatory Visit | Attending: Medical | Admitting: Medical

## 2021-09-20 ENCOUNTER — Encounter: Payer: Self-pay | Admitting: Medical

## 2021-09-20 VITALS — BP 140/86 | HR 68 | Ht 63.0 in | Wt 192.0 lb

## 2021-09-20 DIAGNOSIS — I200 Unstable angina: Secondary | ICD-10-CM | POA: Insufficient documentation

## 2021-09-20 DIAGNOSIS — I10 Essential (primary) hypertension: Secondary | ICD-10-CM

## 2021-09-20 DIAGNOSIS — Z951 Presence of aortocoronary bypass graft: Secondary | ICD-10-CM

## 2021-09-20 DIAGNOSIS — I2510 Atherosclerotic heart disease of native coronary artery without angina pectoris: Secondary | ICD-10-CM

## 2021-09-20 DIAGNOSIS — G459 Transient cerebral ischemic attack, unspecified: Secondary | ICD-10-CM

## 2021-09-20 DIAGNOSIS — E782 Mixed hyperlipidemia: Secondary | ICD-10-CM

## 2021-09-20 LAB — CBC WITH DIFFERENTIAL/PLATELET
Abs Immature Granulocytes: 0.02 10*3/uL (ref 0.00–0.07)
Basophils Absolute: 0 10*3/uL (ref 0.0–0.1)
Basophils Relative: 1 %
Eosinophils Absolute: 0.4 10*3/uL (ref 0.0–0.5)
Eosinophils Relative: 4 %
HCT: 46 % (ref 39.0–52.0)
Hemoglobin: 15.1 g/dL (ref 13.0–17.0)
Immature Granulocytes: 0 %
Lymphocytes Relative: 41 %
Lymphs Abs: 3.3 10*3/uL (ref 0.7–4.0)
MCH: 28.7 pg (ref 26.0–34.0)
MCHC: 32.8 g/dL (ref 30.0–36.0)
MCV: 87.5 fL (ref 80.0–100.0)
Monocytes Absolute: 0.6 10*3/uL (ref 0.1–1.0)
Monocytes Relative: 8 %
Neutro Abs: 3.7 10*3/uL (ref 1.7–7.7)
Neutrophils Relative %: 46 %
Platelets: 236 10*3/uL (ref 150–400)
RBC: 5.26 MIL/uL (ref 4.22–5.81)
RDW: 13.5 % (ref 11.5–15.5)
WBC: 8 10*3/uL (ref 4.0–10.5)
nRBC: 0 % (ref 0.0–0.2)

## 2021-09-20 LAB — BASIC METABOLIC PANEL
Anion gap: 5 (ref 5–15)
BUN: 17 mg/dL (ref 8–23)
CO2: 28 mmol/L (ref 22–32)
Calcium: 9 mg/dL (ref 8.9–10.3)
Chloride: 106 mmol/L (ref 98–111)
Creatinine, Ser: 1 mg/dL (ref 0.61–1.24)
GFR, Estimated: >60 mL/min (ref >60)
Glucose, Bld: 114 mg/dL — ABNORMAL HIGH (ref 70–99)
Potassium: 4.2 mmol/L (ref 3.5–5.1)
Sodium: 139 mmol/L (ref 135–145)

## 2021-09-20 MED ORDER — SODIUM CHLORIDE 0.9% FLUSH: 3.0000 mL | Freq: Two times a day (BID) | INTRAVENOUS | Status: DC

## 2021-09-20 NOTE — H&P (View-Only) (Signed)
?Cardiology Office Note:   ? ?Date:  09/20/2021  ? ?ID:  Jeffery Sweeney, DOB [birth year], MRN [id number] ? ?PCP:  Jeffery Ouch, MD  ?[facility] Cardiologist:  None  ?[facility] Electrophysiologist:  None  ? ?Referring MD: Jeffery Ouch, MD  ? ?Chief Complaint: chest pain ? ?History of Present Illness:   ? ?Jeffery Sweeney is a 61 y.o. male with a hx of  self reported MI x3 (5 years prior) with subsequent PCI and CABGx3, HTN, h/o tobacco use, HLD, h/o stroke 06/2021 who is being seen for follow-up.  ?  ?Seen 07/14/20 for chest pain similar to prior MI. He underwent LHC that showed 3V CAD, LAD disease moderate with significant FFR/IFR ratio 0.74. Also noted severe stenosis in the bifurcating first diagnoal, second diag, and RCA. Lcx also with disease. Echo showed EF 55-60%, NR WMA, mild LVH. Recommendations for transfer to [facility] for CABG. He underwent CABG x3 (LIMA to LAD, rSVG-PDA, SVG-ramus) 2/21. ABIs normal bilaterally.  ?  ?Seen in the ED 10/10/20 for chest pain, felt to be atypical. Troponin negative x2 and EKG without changes. CXR and labs unremarkable. Cardiologist was consulted, low suspicion for ACS, and he was discharged home. At follow-up in the office a Myoview Lexiscan was ordered. Myoview showed no evidence of ischemia with normal  EF.  ? ?Last seen 04/2021 and was overall doing well.  ? ?Admitted 06/2021 for TIA. Echo showed LVEF 60-65%, no WMA, G1DD,  mild MR. ? ?Today, the patient reports chest pain episode yesterday night that woke him up around 5:30AM. It was a pressure. He also felt SOB. No nausea or diaphoresis. He tool NTG without much relief. Called EMS who instructed he take Aspirin, which improved the pain.  Pain lasted 45 minutes. Pain was different to prior angina. He didn't want to go to the ER bc he didn't have a ride back home. He has chest soreness today. Reports he has done a lot of lifting in the last few days. He is on Plavix for TIA. EKG shows no ischemic changes.  ? ?Past  Medical History:  ?Diagnosis Date  ? Hypertension   ? Multiple vessel coronary artery disease   ? Myocardial infarct (HCC)   ? x 3; PCI with single stent placement for each (in [place])  ? NSTEMI (non-ST elevated myocardial infarction) (HCC)   ? LHC 2/18 with multivessel CAD and transfer to [facility] for CABG eval  ? Tobacco use   ? 2 cigarettes daily  ? ? ?Past Surgical History:  ?Procedure Laterality Date  ? CORONARY ANGIOPLASTY WITH STENT PLACEMENT    ? in [place] in 2018-19  ? CORONARY ARTERY BYPASS GRAFT N/A 07/09/2020  ? Procedure: CORONARY ARTERY BYPASS GRAFTING (CABG) x THREE , USING LEFT INTERNAL MAMMARY ARTERY, AND RIGHT LEG GREATER SAPHENOUS VEIN HARVESTED ENDOSCOPICALLY;  Surgeon: Jeffery Ovens, MD;  Location: [facility] OR;  Service: Open Heart Surgery;  Laterality: N/A;  ? INTRAVASCULAR PRESSURE WIRE/FFR STUDY N/A 07/06/2020  ? Procedure: INTRAVASCULAR PRESSURE WIRE/FFR STUDY;  Surgeon: Jeffery Ouch, MD;  Location: [facility] INVASIVE CV LAB;  Service: Cardiovascular;  Laterality: N/A;  LAD  ? LEFT HEART CATH AND CORONARY ANGIOGRAPHY N/A 07/06/2020  ? Procedure: LEFT HEART CATH AND CORONARY ANGIOGRAPHY;  Surgeon: Jeffery Ouch, MD;  Location: [facility] INVASIVE CV LAB;  Service: Cardiovascular;  Laterality: N/A;  ? TEE WITHOUT CARDIOVERSION N/A 07/09/2020  ? Procedure: TRANSESOPHAGEAL ECHOCARDIOGRAM (TEE);  Surgeon: Jeffery Ovens, MD;  Location: [facility] OR;  Service: Open Heart Surgery;  Laterality: N/A;  ? WRIST SURGERY Left   ? for cyst  ? ? ?Current Medications: ?Current Meds  ?Medication Sig  ? clopidogrel (PLAVIX) 75 MG tablet Take 1 tablet (75 mg total) by mouth daily.  ? meclizine (ANTIVERT) 25 MG tablet Take 1 tablet (25 mg total) by mouth 3 (three) times daily as needed for dizziness.  ? metoprolol tartrate (LOPRESSOR) 25 MG tablet Take 1.5 tablets (37.5 mg total) by mouth 2 (two) times daily.  ? nitroGLYCERIN (NITROSTAT) 0.4 MG SL tablet Place 1 tablet (0.4 mg total) under the tongue every 5 (five)  minutes as needed for chest pain.  ? pantoprazole (PROTONIX) 40 MG tablet Take 1 tablet (40 mg total) by mouth once daily.  ? rosuvastatin (CRESTOR) 40 MG tablet Take 1 tablet (40 mg total) by mouth once daily.  ?  ? ?Allergies:   Patient has no known allergies.  ? ?Social History  ? ?Socioeconomic History  ? Marital status: Divorced  ?  Spouse name: Not on file  ? Number of children: Not on file  ? Years of education: Not on file  ? Highest education level: Not on file  ?Occupational History  ? Not on file  ?Tobacco Use  ? Smoking status: Former  ?  Packs/day: 0.50  ?  Types: Cigarettes  ?  Quit date: 07/06/2020  ?  Years since quitting: 1.2  ? Smokeless tobacco: Never  ?Vaping Use  ? Vaping Use: Never used  ?Substance and Sexual Activity  ? Alcohol use: Not Currently  ? Drug use: Not Currently  ? Sexual activity: Not on file  ?Other Topics Concern  ? Not on file  ?Social History Narrative  ? Not on file  ? ?Social Determinants of Health  ? ?Financial Resource Strain: Not on file  ?Food Insecurity: Not on file  ?Transportation Needs: Not on file  ?Physical Activity: Not on file  ?Stress: Not on file  ?Social Connections: Not on file  ?  ? ?Family History: ?The patient's family history is not on file. ? ?ROS:   ?Please see the history of present illness.    ? All other systems reviewed and are negative. ? ?EKGs/Labs/Other Studies Reviewed:   ? ?The following studies were reviewed today: ? ?Echo 06/2021 ? 1. Left ventricular ejection fraction, by estimation, is 60 to 65%. The  ?left ventricle has normal function. The left ventricle has no regional  ?wall motion abnormalities. Left ventricular diastolic parameters are  ?consistent with Grade I diastolic  ?dysfunction (impaired relaxation).  ? 2. Right ventricular systolic function is normal. The right ventricular  ?size is normal. There is normal pulmonary artery systolic pressure. The  ?estimated right ventricular systolic pressure is 26.0 mmHg.  ? 3. The mitral valve  is normal in structure. Mild mitral valve  ?regurgitation. No evidence of mitral stenosis.  ? 4. The aortic valve is normal in structure. Aortic valve regurgitation is  ?not visualized. Aortic valve sclerosis is present, with no evidence of  ?aortic valve stenosis.  ? 5. The inferior vena cava is normal in size with greater than 50%  ?respiratory variability, suggesting right atrial pressure of 3 mmHg.  ? ?LHC 06/2020 ?The left ventricular systolic function is normal. ?LV end diastolic pressure is normal. ?The left ventricular ejection fraction is 55-65% by visual estimate. ?Prox RCA lesion is 80% stenosed. ?Dist RCA lesion is 30% stenosed. ?RPDA lesion is 90% stenosed. ?Prox LAD to Mid LAD lesion is 60% stenosed. ?  1st Diag lesion is 90% stenosed. ?Lat 1st Diag lesion is 90% stenosed. ?Mid Cx to Dist Cx lesion is 90% stenosed. ?2nd Diag lesion is 80% stenosed. ?2nd Mrg lesion is 90% stenosed. ?  ?1.  Significant three-vessel coronary artery disease.  The LAD disease appears moderate angiographically.  However, it is diffusely diseased in the proximal mid segment and was highly significant by fractional flow reserve evaluation with an IFR ratio of 0.74.  In addition, there is severe stenosis in the bifurcating first diagonal, second diagonal and right coronary artery.  The left circumflex is also significantly diseased in the mid to distal segment but OM branches are small and likely not graftable. ?2.  Normal LV systolic function and normal left ventricular end-diastolic pressure. ?  ?Recommendations: ?I recommend transfer to [facility] for CABG evaluation. ?Resume heparin drip 6 hours after sheath pull. ? ?Coronary Diagrams ? ?Diagnostic ?Dominance: Right ? ?  ? ?EKG:  EKG is  ordered today.  The ekg ordered today demonstrates NSR, 68bpm,  TWI aVL, no changes ? ?Recent Labs: ?11/22/2020: Magnesium 1.9; TSH 2.229 ?02/05/2021: ALT 17 ?09/20/2021: BUN 17; Creatinine, Ser 1.00; Hemoglobin 15.1; Platelets 236;  Potassium 4.2; Sodium 139  ?Recent Lipid Panel ?   ?Component Value Date/Time  ? CHOL 151 07/06/2021 0635  ? TRIG 194 (H) 07/06/2021 0635  ? HDL 32 (L) 07/06/2021 0635  ? CHOLHDL 4.7 07/06/2021 0635  ? VLDL

## 2021-09-20 NOTE — Progress Notes (Signed)
?Cardiology Office Note:   ? ?Date:  09/20/2021  ? ?ID:  Jeffery Sweeney, DOB [birth year], MRN [id number] ? ?PCP:  Iran Ouch, MD  ?[facility]Care Cardiologist:  None  ?[facility] Electrophysiologist:  None  ? ?Referring MD: Iran Ouch, MD  ? ?Chief Complaint: chest pain ? ?History of Present Illness:   ? ?Jeffery Sweeney is a 61 y.o. male with a hx of  self reported MI x3 (5 years prior) with subsequent PCI and CABGx3, HTN, h/o tobacco use, HLD, h/o stroke 06/2021 who is being seen for follow-up.  ?  ?Seen 07/14/20 for chest pain similar to prior MI. He underwent LHC that showed 3V CAD, LAD disease moderate with significant FFR/IFR ratio 0.74. Also noted severe stenosis in the bifurcating first diagnoal, second diag, and RCA. Lcx also with disease. Echo showed EF 55-60%, NR WMA, mild LVH. Recommendations for transfer to [facility] for CABG. He underwent CABG x3 (LIMA to LAD, rSVG-PDA, SVG-ramus) 2/21. ABIs normal bilaterally.  ?  ?Seen in the ED 10/10/20 for chest pain, felt to be atypical. Troponin negative x2 and EKG without changes. CXR and labs unremarkable. Cardiologist was consulted, low suspicion for ACS, and he was discharged home. At follow-up in the office a Myoview Lexiscan was ordered. Myoview showed no evidence of ischemia with normal  EF.  ? ?Last seen 04/2021 and was overall doing well.  ? ?Admitted 06/2021 for TIA. Echo showed LVEF 60-65%, no WMA, G1DD,  mild MR. ? ?Today, the patient reports chest pain episode yesterday night that woke him up around 5:30AM. It was a pressure. He also felt SOB. No nausea or diaphoresis. He tool NTG without much relief. Called EMS who instructed he take Aspirin, which improved the pain.  Pain lasted 45 minutes. Pain was different to prior angina. He didn't want to go to the ER bc he didn't have a ride back home. He has chest soreness today. Reports he has done a lot of lifting in the last few days. He is on Plavix for TIA. EKG shows no ischemic changes.  ? ?Past  Medical History:  ?Diagnosis Date  ? Hypertension   ? Multiple vessel coronary artery disease   ? Myocardial infarct (HCC)   ? x 3; PCI with single stent placement for each (in [place])  ? NSTEMI (non-ST elevated myocardial infarction) (HCC)   ? LHC 2/18 with multivessel CAD and transfer to [facility] for CABG eval  ? Tobacco use   ? 2 cigarettes daily  ? ? ?Past Surgical History:  ?Procedure Laterality Date  ? CORONARY ANGIOPLASTY WITH STENT PLACEMENT    ? in [place] in 2018-19  ? CORONARY ARTERY BYPASS GRAFT N/A 07/09/2020  ? Procedure: CORONARY ARTERY BYPASS GRAFTING (CABG) x THREE , USING LEFT INTERNAL MAMMARY ARTERY, AND RIGHT LEG GREATER SAPHENOUS VEIN HARVESTED ENDOSCOPICALLY;  Surgeon: Delight Ovens, MD;  Location: [facility] OR;  Service: Open Heart Surgery;  Laterality: N/A;  ? INTRAVASCULAR PRESSURE WIRE/FFR STUDY N/A 07/06/2020  ? Procedure: INTRAVASCULAR PRESSURE WIRE/FFR STUDY;  Surgeon: Iran Ouch, MD;  Location: [facility] INVASIVE CV LAB;  Service: Cardiovascular;  Laterality: N/A;  LAD  ? LEFT HEART CATH AND CORONARY ANGIOGRAPHY N/A 07/06/2020  ? Procedure: LEFT HEART CATH AND CORONARY ANGIOGRAPHY;  Surgeon: Iran Ouch, MD;  Location: [facility] INVASIVE CV LAB;  Service: Cardiovascular;  Laterality: N/A;  ? TEE WITHOUT CARDIOVERSION N/A 07/09/2020  ? Procedure: TRANSESOPHAGEAL ECHOCARDIOGRAM (TEE);  Surgeon: Delight Ovens, MD;  Location: [facility] OR;  Service: Open Heart Surgery;  Laterality: N/A;  ? WRIST SURGERY Left   ? for cyst  ? ? ?Current Medications: ?Current Meds  ?Medication Sig  ? clopidogrel (PLAVIX) 75 MG tablet Take 1 tablet (75 mg total) by mouth daily.  ? meclizine (ANTIVERT) 25 MG tablet Take 1 tablet (25 mg total) by mouth 3 (three) times daily as needed for dizziness.  ? metoprolol tartrate (LOPRESSOR) 25 MG tablet Take 1.5 tablets (37.5 mg total) by mouth 2 (two) times daily.  ? nitroGLYCERIN (NITROSTAT) 0.4 MG SL tablet Place 1 tablet (0.4 mg total) under the tongue every 5 (five)  minutes as needed for chest pain.  ? pantoprazole (PROTONIX) 40 MG tablet Take 1 tablet (40 mg total) by mouth once daily.  ? rosuvastatin (CRESTOR) 40 MG tablet Take 1 tablet (40 mg total) by mouth once daily.  ?  ? ?Allergies:   Patient has no known allergies.  ? ?Social History  ? ?Socioeconomic History  ? Marital status: Divorced  ?  Spouse name: Not on file  ? Number of children: Not on file  ? Years of education: Not on file  ? Highest education level: Not on file  ?Occupational History  ? Not on file  ?Tobacco Use  ? Smoking status: Former  ?  Packs/day: 0.50  ?  Types: Cigarettes  ?  Quit date: 07/06/2020  ?  Years since quitting: 1.2  ? Smokeless tobacco: Never  ?Vaping Use  ? Vaping Use: Never used  ?Substance and Sexual Activity  ? Alcohol use: Not Currently  ? Drug use: Not Currently  ? Sexual activity: Not on file  ?Other Topics Concern  ? Not on file  ?Social History Narrative  ? Not on file  ? ?Social Determinants of Health  ? ?Financial Resource Strain: Not on file  ?Food Insecurity: Not on file  ?Transportation Needs: Not on file  ?Physical Activity: Not on file  ?Stress: Not on file  ?Social Connections: Not on file  ?  ? ?Family History: ?The patient's family history is not on file. ? ?ROS:   ?Please see the history of present illness.    ? All other systems reviewed and are negative. ? ?EKGs/Labs/Other Studies Reviewed:   ? ?The following studies were reviewed today: ? ?Echo 06/2021 ? 1. Left ventricular ejection fraction, by estimation, is 60 to 65%. The  ?left ventricle has normal function. The left ventricle has no regional  ?wall motion abnormalities. Left ventricular diastolic parameters are  ?consistent with Grade I diastolic  ?dysfunction (impaired relaxation).  ? 2. Right ventricular systolic function is normal. The right ventricular  ?size is normal. There is normal pulmonary artery systolic pressure. The  ?estimated right ventricular systolic pressure is 26.0 mmHg.  ? 3. The mitral valve  is normal in structure. Mild mitral valve  ?regurgitation. No evidence of mitral stenosis.  ? 4. The aortic valve is normal in structure. Aortic valve regurgitation is  ?not visualized. Aortic valve sclerosis is present, with no evidence of  ?aortic valve stenosis.  ? 5. The inferior vena cava is normal in size with greater than 50%  ?respiratory variability, suggesting right atrial pressure of 3 mmHg.  ? ?LHC 06/2020 ?The left ventricular systolic function is normal. ?LV end diastolic pressure is normal. ?The left ventricular ejection fraction is 55-65% by visual estimate. ?Prox RCA lesion is 80% stenosed. ?Dist RCA lesion is 30% stenosed. ?RPDA lesion is 90% stenosed. ?Prox LAD to Mid LAD lesion is 60% stenosed. ?  1st Diag lesion is 90% stenosed. ?Lat 1st Diag lesion is 90% stenosed. ?Mid Cx to Dist Cx lesion is 90% stenosed. ?2nd Diag lesion is 80% stenosed. ?2nd Mrg lesion is 90% stenosed. ?  ?1.  Significant three-vessel coronary artery disease.  The LAD disease appears moderate angiographically.  However, it is diffusely diseased in the proximal mid segment and was highly significant by fractional flow reserve evaluation with an IFR ratio of 0.74.  In addition, there is severe stenosis in the bifurcating first diagonal, second diagonal and right coronary artery.  The left circumflex is also significantly diseased in the mid to distal segment but OM branches are small and likely not graftable. ?2.  Normal LV systolic function and normal left ventricular end-diastolic pressure. ?  ?Recommendations: ?[facility]. ?Resume heparin drip 6 hours after sheath pull. ? ?Coronary Diagrams ? ?Diagnostic ?Dominance: Right ? ?  ? ?EKG:  EKG is  ordered today.  The ekg ordered today demonstrates NSR, 68bpm,  TWI aVL, no changes ? ?Recent Labs: ?11/22/2020: Magnesium 1.9; TSH 2.229 ?02/05/2021: ALT 17 ?09/20/2021: BUN 17; Creatinine, Ser 1.00; Hemoglobin 15.1; Platelets 236;  Potassium 4.2; Sodium 139  ?Recent Lipid Panel ?   ?Component Value Date/Time  ? CHOL 151 07/06/2021 0635  ? TRIG 194 (H) 07/06/2021 [id number]  ? HDL 32 (L) 07/06/2021 [id number]  ? CHOLHDL 4.7 07/06/2021 0635  ? VLDL

## 2021-09-20 NOTE — Patient Instructions (Signed)
Medication Instructions:  ?Your physician recommends that you continue on your current medications as directed. Please refer to the Current Medication list given to you today. ? ?*If you need a refill on your cardiac medications before your next appointment, please call your pharmacy* ? ? ?Lab Work: ?Bmp and Cbc today ? ?Please have your labs drawn at the [facility]. ?Stop at the Registration desk to check in. ? ?If you have labs (blood work) drawn today and your tests are completely normal, you will receive your results only by: ?MyChart Message (if you have MyChart) OR ?A paper copy in the mail ?If you have any lab test that is abnormal or we need to change your treatment, we will call you to review the results. ? ? ?Testing/Procedures: ?Your physician has requested that you have an echocardiogram. Echocardiography is a painless test that uses sound waves to create images of your heart. It provides your doctor with information about the size and shape of your heart and how well your heart?s chambers and valves are working. This procedure takes approximately one hour. There are no restrictions for this procedure. ? ? ?Your physician has requested that you have a cardiac catheterization. Cardiac catheterization is used to diagnose and/or treat various heart conditions. Doctors may recommend this procedure for a number of different reasons. The most common reason is to evaluate chest pain. Chest pain can be a symptom of coronary artery disease (CAD), and cardiac catheterization can show whether plaque is narrowing or blocking your heart?s arteries. This procedure is also used to evaluate the valves, as well as measure the blood flow and oxygen levels in different parts of your heart. For further information please visit [email or web address] Please follow instruction sheet, as given.  ? ? ?Follow-Up: ?At [facility], you and your health needs are our priority.  As part of our continuing mission to provide  you with exceptional heart care, we have created designated Provider Care Teams.  These Care Teams include your primary Cardiologist (physician) and Advanced Practice Providers (APPs -  Physician Assistants and Nurse Practitioners) who all work together to provide you with the care you need, when you need it. ? ?We recommend signing up for the patient portal called "MyChart".  Sign up information is provided on this After Visit Summary.  MyChart is used to connect with patients for Virtual Visits (Telemedicine).  Patients are able to view lab/test results, encounter notes, upcoming appointments, etc.  Non-urgent messages can be sent to your provider as well.   ?To learn more about what you can do with MyChart, go to [email or web address].   ? ?Your next appointment:   ?2-3 week(s) ? ?The format for your next appointment:   ?In Person ? ?Provider:   ?You may see Lorine Bears, MD or one of the following Advanced Practice Providers on your designated Care Team:   ?Nicolasa Ducking, NP ?Eula Listen, PA-C ?Cadence Fransico Michael, PA-C ? ? ?Other Instructions ? ?[facility] ?[facility] ?[address], [address] ?[place] [address] ?Dept: [phone] ?Loc: [phone] ? ?[facility]  09/20/2021 ? ?You are scheduled for a Cardiac Catheterization on Monday, May 8 with Dr. Lorine Bears. ? ?1. Please arrive at the [facility] [address] [place], [place] [address] at 9:30 AM (This time is one hour before your procedure to ensure your preparation). Free valet parking service is available.  ? ?Special note: Every effort is made to have  your procedure done on time. Please understand that emergencies sometimes delay scheduled procedures. ? ?2. Diet: Do not eat solid foods after midnight.  You may have clear liquids until 5 AM upon the day of the procedure. ? ?3. Labs: You will need to have blood drawn today at the [facility]. You do  not need to be fasting. ? ?4. Medication instructions in preparation for your procedure: ? ? Contrast Allergy: No ? ? ?On the morning of your procedure, take Plavix/Clopidogrel and any morning medicines NOT listed above.  You may use sips of water. ? ?5. Plan to go home the same day, you will only stay overnight if medically necessary. ?6. You MUST have a responsible adult to drive you home. ?7. An adult MUST be with you the first 24 hours after you arrive home. ?8. Bring a current list of your medications, and the last time and date medication taken. ?9. Bring ID and current insurance cards. ?10.Please wear clothes that are easy to get on and off and wear slip-on shoes. ? ?Thank you for allowing [place] to care for you! ?  -- [facility] ? ? ?Important Information About Sugar ? ? ? ? ? ? ?

## 2021-09-23 ENCOUNTER — Encounter
Admission: RE | Disposition: A | Discharge status: Home / Self Care | Payer: Self-pay | Source: Home / Self Care | Attending: Cardiovascular Disease

## 2021-09-23 ENCOUNTER — Inpatient Hospital Stay
Admission: RE | Admit: 2021-09-23 | Discharge: 2021-09-26 | DRG: 246 | Disposition: A | Discharge status: Home / Self Care | Payer: Self-pay | Attending: Cardiovascular Disease | Admitting: Cardiovascular Disease

## 2021-09-23 DIAGNOSIS — I639 Cerebral infarction, unspecified: Secondary | ICD-10-CM | POA: Diagnosis present

## 2021-09-23 DIAGNOSIS — Z8673 Personal history of transient ischemic attack (TIA), and cerebral infarction without residual deficits: Secondary | ICD-10-CM

## 2021-09-23 DIAGNOSIS — E785 Hyperlipidemia, unspecified: Secondary | ICD-10-CM

## 2021-09-23 DIAGNOSIS — I2582 Chronic total occlusion of coronary artery: Secondary | ICD-10-CM | POA: Diagnosis present

## 2021-09-23 DIAGNOSIS — I2510 Atherosclerotic heart disease of native coronary artery without angina pectoris: Secondary | ICD-10-CM | POA: Diagnosis present

## 2021-09-23 DIAGNOSIS — I200 Unstable angina: Principal | ICD-10-CM | POA: Diagnosis present

## 2021-09-23 DIAGNOSIS — I2542 Coronary artery dissection: Secondary | ICD-10-CM | POA: Diagnosis present

## 2021-09-23 DIAGNOSIS — I10 Essential (primary) hypertension: Secondary | ICD-10-CM | POA: Diagnosis present

## 2021-09-23 DIAGNOSIS — I252 Old myocardial infarction: Secondary | ICD-10-CM

## 2021-09-23 DIAGNOSIS — F17210 Nicotine dependence, cigarettes, uncomplicated: Secondary | ICD-10-CM | POA: Diagnosis present

## 2021-09-23 DIAGNOSIS — I5033 Acute on chronic diastolic (congestive) heart failure: Secondary | ICD-10-CM | POA: Diagnosis present

## 2021-09-23 DIAGNOSIS — I25810 Atherosclerosis of coronary artery bypass graft(s) without angina pectoris: Secondary | ICD-10-CM | POA: Diagnosis present

## 2021-09-23 DIAGNOSIS — E782 Mixed hyperlipidemia: Secondary | ICD-10-CM | POA: Diagnosis present

## 2021-09-23 DIAGNOSIS — Z8249 Family history of ischemic heart disease and other diseases of the circulatory system: Secondary | ICD-10-CM

## 2021-09-23 DIAGNOSIS — Z7902 Long term (current) use of antithrombotics/antiplatelets: Secondary | ICD-10-CM

## 2021-09-23 DIAGNOSIS — I110 Hypertensive heart disease with heart failure: Secondary | ICD-10-CM | POA: Diagnosis present

## 2021-09-23 DIAGNOSIS — Z720 Tobacco use: Secondary | ICD-10-CM | POA: Diagnosis present

## 2021-09-23 DIAGNOSIS — I2109 ST elevation (STEMI) myocardial infarction involving other coronary artery of anterior wall: Principal | ICD-10-CM | POA: Diagnosis present

## 2021-09-23 DIAGNOSIS — I213 ST elevation (STEMI) myocardial infarction of unspecified site: Secondary | ICD-10-CM

## 2021-09-23 HISTORY — PX: CORONARY STENT INTERVENTION: CATH118234

## 2021-09-23 HISTORY — PX: CORONARY/GRAFT ACUTE MI REVASCULARIZATION: CATH118305

## 2021-09-23 HISTORY — PX: LEFT HEART CATH AND CORS/GRAFTS ANGIOGRAPHY: CATH118250

## 2021-09-23 HISTORY — DX: Hyperlipidemia, unspecified: E78.5

## 2021-09-23 HISTORY — PX: LEFT HEART CATH AND CORONARY ANGIOGRAPHY: CATH118249

## 2021-09-23 HISTORY — DX: Unstable angina: I20.0

## 2021-09-23 HISTORY — DX: ST elevation (STEMI) myocardial infarction of unspecified site: I21.3

## 2021-09-23 LAB — POCT ACTIVATED CLOTTING TIME
Activated Clotting Time: 414 seconds
Activated Clotting Time: 245 seconds
Activated Clotting Time: 353 seconds
Activated Clotting Time: 462 seconds
Activated Clotting Time: 275 seconds
Activated Clotting Time: 401 seconds

## 2021-09-23 LAB — GLUCOSE, CAPILLARY: Glucose-Capillary: 91 mg/dL (ref 70–99)

## 2021-09-23 LAB — TROPONIN I (HIGH SENSITIVITY): Troponin I (High Sensitivity): 4079 ng/L (ref <18)

## 2021-09-23 LAB — PLATELET COUNT: Platelets: 208 10*3/uL (ref 150–400)

## 2021-09-23 SURGERY — LEFT HEART CATH AND CORS/GRAFTS ANGIOGRAPHY
Anesthesia: Moderate Sedation

## 2021-09-23 SURGERY — CORONARY/GRAFT ACUTE MI REVASCULARIZATION
Anesthesia: Moderate Sedation

## 2021-09-23 MED ORDER — ADENOSINE (DIAGNOSTIC) FOR INTRACORONARY USE
INTRAVENOUS | Status: DC | PRN
  Administered 2021-09-23: 120 ug via INTRACORONARY

## 2021-09-23 MED ORDER — TIROFIBAN HCL IN NACL 5-0.9 MG/100ML-% IV SOLN
0.1500 ug/kg/min | INTRAVENOUS | Status: DC
  Administered 2021-09-23: 0.15 ug/kg/min via INTRAVENOUS
  Filled 2021-09-23 (×3): qty 100

## 2021-09-23 MED ORDER — NITROGLYCERIN 1 MG/10 ML FOR IR/CATH LAB
INTRA_ARTERIAL | Status: DC | PRN
  Administered 2021-09-23: 100 ug

## 2021-09-23 MED ORDER — MIDAZOLAM HCL 2 MG/2ML IJ SOLN
INTRAMUSCULAR | Status: DC | PRN
  Administered 2021-09-23: 1 mg via INTRAVENOUS
  Administered 2021-09-23 (×2): .5 mg via INTRAVENOUS

## 2021-09-23 MED ORDER — SODIUM CHLORIDE 0.9 % WEIGHT BASED INFUSION
1.0000 mL/kg/h | INTRAVENOUS | Status: DC
  Administered 2021-09-23: 1 mL/kg/h via INTRAVENOUS

## 2021-09-23 MED ORDER — TIROFIBAN HCL IN NACL 5-0.9 MG/100ML-% IV SOLN
INTRAVENOUS | Status: AC
  Filled 2021-09-23: qty 100

## 2021-09-23 MED ORDER — NITROGLYCERIN 1 MG/10 ML FOR IR/CATH LAB
INTRA_ARTERIAL | Status: AC
  Filled 2021-09-23: qty 10

## 2021-09-23 MED ORDER — FENTANYL CITRATE (PF) 100 MCG/2ML IJ SOLN
INTRAMUSCULAR | Status: AC
  Filled 2021-09-23: qty 2

## 2021-09-23 MED ORDER — TICAGRELOR 90 MG PO TABS
90.0000 mg | ORAL_TABLET | Freq: Two times a day (BID) | ORAL | Status: DC
  Administered 2021-09-24 – 2021-09-26 (×5): 90 mg via ORAL
  Filled 2021-09-23 (×5): qty 1

## 2021-09-23 MED ORDER — LIDOCAINE HCL (PF) 1 % IJ SOLN
INTRAMUSCULAR | Status: DC | PRN
  Administered 2021-09-23: 10 mL

## 2021-09-23 MED ORDER — VERAPAMIL HCL 2.5 MG/ML IV SOLN
INTRAVENOUS | Status: DC | PRN
  Administered 2021-09-23: 2.5 mg via INTRA_ARTERIAL

## 2021-09-23 MED ORDER — ADENOSINE 6 MG/2ML IV SOLN
INTRAVENOUS | Status: AC
  Filled 2021-09-23: qty 2

## 2021-09-23 MED ORDER — MORPHINE SULFATE (PF) 2 MG/ML IV SOLN
INTRAVENOUS | Status: AC
  Filled 2021-09-23: qty 1

## 2021-09-23 MED ORDER — SODIUM CHLORIDE 0.9% FLUSH
3.0000 mL | Freq: Two times a day (BID) | INTRAVENOUS | Status: DC
  Administered 2021-09-24 – 2021-09-25 (×3): 3 mL via INTRAVENOUS

## 2021-09-23 MED ORDER — METOPROLOL TARTRATE 25 MG PO TABS
37.5000 mg | ORAL_TABLET | Freq: Two times a day (BID) | ORAL | Status: DC
  Administered 2021-09-23: 37.5 mg via ORAL
  Filled 2021-09-23: qty 1.5
  Filled 2021-09-23: qty 2
  Filled 2021-09-23: qty 1.5

## 2021-09-23 MED ORDER — FENTANYL CITRATE (PF) 100 MCG/2ML IJ SOLN
INTRAMUSCULAR | Status: AC
Start: 2021-09-23 — End: ?
  Filled 2021-09-23: qty 2

## 2021-09-23 MED ORDER — SODIUM CHLORIDE 0.9 % WEIGHT BASED INFUSION
1.0000 mL/kg/h | INTRAVENOUS | Status: AC
  Administered 2021-09-23: 1 mL/kg/h via INTRAVENOUS

## 2021-09-23 MED ORDER — ADENOSINE (DIAGNOSTIC) 3 MG/ML IV SOLN
INTRAVENOUS | Status: AC
  Filled 2021-09-23: qty 30

## 2021-09-23 MED ORDER — MIDAZOLAM HCL 2 MG/2ML IJ SOLN
INTRAMUSCULAR | Status: AC
  Filled 2021-09-23: qty 2

## 2021-09-23 MED ORDER — NITROGLYCERIN 0.4 MG SL SUBL
0.4000 mg | SUBLINGUAL_TABLET | SUBLINGUAL | Status: DC | PRN
  Administered 2021-09-23: 0.4 mg via SUBLINGUAL
  Filled 2021-09-23 (×2): qty 1

## 2021-09-23 MED ORDER — TIROFIBAN (AGGRASTAT) BOLUS VIA INFUSION
INTRAVENOUS | Status: DC | PRN
Start: 2021-09-23 — End: 2021-09-23
  Administered 2021-09-23: 2175 ug via INTRAVENOUS

## 2021-09-23 MED ORDER — TIROFIBAN HCL IN NACL 5-0.9 MG/100ML-% IV SOLN
INTRAVENOUS | Status: DC | PRN
  Administered 2021-09-23: .15 ug/kg/min via INTRAVENOUS

## 2021-09-23 MED ORDER — LIDOCAINE HCL 1 % IJ SOLN
INTRAMUSCULAR | Status: AC
  Filled 2021-09-23: qty 20

## 2021-09-23 MED ORDER — SODIUM CHLORIDE 0.9 % WEIGHT BASED INFUSION: 3.0000 mL/kg/h | INTRAVENOUS | Status: DC

## 2021-09-23 MED ORDER — SODIUM CHLORIDE 0.9 % IV SOLN: 250.0000 mL | INTRAVENOUS | Status: DC | PRN

## 2021-09-23 MED ORDER — HEPARIN (PORCINE) IN NACL 1000-0.9 UT/500ML-% IV SOLN
INTRAVENOUS | Status: DC | PRN
  Administered 2021-09-23: 1000 mL
  Administered 2021-09-23: 500 mL

## 2021-09-23 MED ORDER — NITROGLYCERIN 1 MG/10 ML FOR IR/CATH LAB
INTRA_ARTERIAL | Status: DC | PRN
Start: 2021-09-23 — End: 2021-09-23
  Administered 2021-09-23 (×5): 200 ug via INTRACORONARY

## 2021-09-23 MED ORDER — SODIUM CHLORIDE 0.9% FLUSH
3.0000 mL | Freq: Two times a day (BID) | INTRAVENOUS | Status: DC
  Administered 2021-09-24 – 2021-09-26 (×5): 3 mL via INTRAVENOUS

## 2021-09-23 MED ORDER — VERAPAMIL HCL 2.5 MG/ML IV SOLN
INTRAVENOUS | Status: AC
  Filled 2021-09-23: qty 2

## 2021-09-23 MED ORDER — HEPARIN SODIUM (PORCINE) 1000 UNIT/ML IJ SOLN
INTRAMUSCULAR | Status: AC
  Filled 2021-09-23: qty 10

## 2021-09-23 MED ORDER — ONDANSETRON HCL 4 MG/2ML IJ SOLN: 4.0000 mg | Freq: Four times a day (QID) | INTRAMUSCULAR | Status: DC | PRN

## 2021-09-23 MED ORDER — ROSUVASTATIN CALCIUM 10 MG PO TABS
40.0000 mg | ORAL_TABLET | Freq: Every day | ORAL | Status: DC
Start: 2021-09-24 — End: 2021-09-26
  Administered 2021-09-24 – 2021-09-26 (×3): 40 mg via ORAL
  Filled 2021-09-23 (×3): qty 4
  Filled 2021-09-23: qty 1

## 2021-09-23 MED ORDER — TICAGRELOR 90 MG PO TABS
ORAL_TABLET | ORAL | Status: AC
  Filled 2021-09-23: qty 2

## 2021-09-23 MED ORDER — SODIUM CHLORIDE 0.9% FLUSH: 3.0000 mL | INTRAVENOUS | Status: DC | PRN

## 2021-09-23 MED ORDER — TICAGRELOR 60 MG PO TABS
ORAL_TABLET | ORAL | Status: DC | PRN
  Administered 2021-09-23: 180 mg via ORAL

## 2021-09-23 MED ORDER — FENTANYL CITRATE (PF) 100 MCG/2ML IJ SOLN
INTRAMUSCULAR | Status: DC | PRN
  Administered 2021-09-23: 50 ug via INTRAVENOUS
  Administered 2021-09-23 (×2): 25 ug via INTRAVENOUS

## 2021-09-23 MED ORDER — MECLIZINE HCL 25 MG PO TABS
25.0000 mg | ORAL_TABLET | Freq: Three times a day (TID) | ORAL | Status: DC | PRN
  Filled 2021-09-23: qty 1

## 2021-09-23 MED ORDER — PANTOPRAZOLE SODIUM 40 MG PO TBEC
40.0000 mg | DELAYED_RELEASE_TABLET | Freq: Every day | ORAL | Status: DC
Start: 2021-09-24 — End: 2021-09-26
  Administered 2021-09-24 – 2021-09-26 (×3): 40 mg via ORAL
  Filled 2021-09-23 (×3): qty 1

## 2021-09-23 MED ORDER — HEPARIN SODIUM (PORCINE) 1000 UNIT/ML IJ SOLN
INTRAMUSCULAR | Status: DC | PRN
  Administered 2021-09-23 (×2): 2000 [IU] via INTRAVENOUS
  Administered 2021-09-23: 8000 [IU] via INTRAVENOUS

## 2021-09-23 MED ORDER — MORPHINE SULFATE (PF) 2 MG/ML IV SOLN
2.0000 mg | INTRAVENOUS | Status: DC | PRN
  Administered 2021-09-23: 2 mg via INTRAVENOUS

## 2021-09-23 MED ORDER — NITROGLYCERIN 1 MG/10 ML FOR IR/CATH LAB
INTRA_ARTERIAL | Status: AC
Start: 2021-09-23 — End: ?
  Filled 2021-09-23: qty 10

## 2021-09-23 MED ORDER — HEPARIN (PORCINE) IN NACL 1000-0.9 UT/500ML-% IV SOLN
INTRAVENOUS | Status: DC | PRN
  Administered 2021-09-23 (×2): 500 mL

## 2021-09-23 MED ORDER — HEPARIN (PORCINE) IN NACL 1000-0.9 UT/500ML-% IV SOLN
INTRAVENOUS | Status: AC
  Filled 2021-09-23: qty 1000

## 2021-09-23 MED ORDER — ASPIRIN 81 MG PO CHEW
81.0000 mg | CHEWABLE_TABLET | Freq: Every day | ORAL | Status: DC
  Administered 2021-09-24 – 2021-09-26 (×3): 81 mg via ORAL
  Filled 2021-09-23 (×3): qty 1

## 2021-09-23 MED ORDER — MIDAZOLAM HCL 2 MG/2ML IJ SOLN
INTRAMUSCULAR | Status: DC | PRN
  Administered 2021-09-23: 1 mg via INTRAVENOUS

## 2021-09-23 MED ORDER — NITROGLYCERIN IN D5W 200-5 MCG/ML-% IV SOLN
0.0000 ug/min | INTRAVENOUS | Status: DC
  Administered 2021-09-23: 5 ug/min via INTRAVENOUS
  Filled 2021-09-23: qty 250

## 2021-09-23 MED ORDER — ASPIRIN 81 MG PO CHEW: 81.0000 mg | CHEWABLE_TABLET | ORAL | Status: DC

## 2021-09-23 MED ORDER — LIDOCAINE HCL (PF) 1 % IJ SOLN
INTRAMUSCULAR | Status: DC | PRN
Start: 2021-09-23 — End: 2021-09-23
  Administered 2021-09-23: 2 mL

## 2021-09-23 MED ORDER — SODIUM CHLORIDE 0.9 % WEIGHT BASED INFUSION: 1.0000 mL/kg/h | INTRAVENOUS | Status: DC

## 2021-09-23 MED ORDER — LIDOCAINE-EPINEPHRINE (PF) 1 %-1:200000 IJ SOLN
INTRAMUSCULAR | Status: AC
  Filled 2021-09-23: qty 10

## 2021-09-23 MED ORDER — HEPARIN (PORCINE) IN NACL 1000-0.9 UT/500ML-% IV SOLN
INTRAVENOUS | Status: AC
  Filled 2021-09-23: qty 500

## 2021-09-23 MED ORDER — IOHEXOL 350 MG/ML SOLN
INTRAVENOUS | Status: DC | PRN
  Administered 2021-09-23: 180 mL

## 2021-09-23 MED ORDER — HEPARIN SODIUM (PORCINE) 1000 UNIT/ML IJ SOLN
INTRAMUSCULAR | Status: DC | PRN
  Administered 2021-09-23: 2000 [IU] via INTRAVENOUS
  Administered 2021-09-23: 5000 [IU] via INTRAVENOUS
  Administered 2021-09-23: 4500 [IU] via INTRAVENOUS

## 2021-09-23 MED ORDER — CLOPIDOGREL BISULFATE 75 MG PO TABS
75.0000 mg | ORAL_TABLET | Freq: Every day | ORAL | Status: DC
Start: 2021-09-24 — End: 2021-09-23

## 2021-09-23 MED ORDER — IOHEXOL 300 MG/ML  SOLN
INTRAMUSCULAR | Status: DC | PRN
  Administered 2021-09-23: 205 mL

## 2021-09-23 MED ORDER — FENTANYL CITRATE (PF) 100 MCG/2ML IJ SOLN
INTRAMUSCULAR | Status: DC | PRN
Start: 2021-09-23 — End: 2021-09-23
  Administered 2021-09-23 (×2): 50 ug via INTRAVENOUS

## 2021-09-23 MED ORDER — ACETAMINOPHEN 325 MG PO TABS: 650.0000 mg | ORAL_TABLET | ORAL | Status: DC | PRN

## 2021-09-23 MED ORDER — TIROFIBAN HCL IN NACL 5-0.9 MG/100ML-% IV SOLN
INTRAVENOUS | Status: AC | PRN
  Administered 2021-09-23: .15 ug/kg/min via INTRAVENOUS

## 2021-09-23 SURGICAL SUPPLY — 29 items
BALLN TREK RX 2.5X20 (BALLOONS) ×2
BALLN ~~LOC~~ EUPHORA RX 3.25X20 (BALLOONS) ×2
BALLOON TREK RX 2.5X20 (BALLOONS) IMPLANT
BALLOON ~~LOC~~ EUPHORA RX 3.25X20 (BALLOONS) IMPLANT
CANNULA 5F STIFF (CANNULA) ×1 IMPLANT
CATH EXTRAC PRONTO 5.5F 138CM (CATHETERS) ×1 IMPLANT
CATH INFINITI 5FR JL4 (CATHETERS) ×1 IMPLANT
CATH INFINITI JR4 5F (CATHETERS) ×1 IMPLANT
CATH VISTA GUIDE 6FR IM 90 CM (CATHETERS) ×1 IMPLANT
CLOSURE PERCLOSE PROSTYLE (VASCULAR PRODUCTS) ×1 IMPLANT
DRAPE BRACHIAL (DRAPES) ×1 IMPLANT
GLIDESHEATH SLEND SS 6F .021 (SHEATH) IMPLANT
KIT ENCORE 26 ADVANTAGE (KITS) ×1 IMPLANT
PACK CARDIAC CATH (CUSTOM PROCEDURE TRAY) ×2 IMPLANT
PROTECTION STATION PRESSURIZED (MISCELLANEOUS) ×2
SET ATX SIMPLICITY (MISCELLANEOUS) ×1 IMPLANT
SHEATH AVANTI 6FR X 11CM (SHEATH) ×1 IMPLANT
STATION PROTECTION PRESSURIZED (MISCELLANEOUS) IMPLANT
STENT ONYX FRONTIER 2.25X12 (Permanent Stent) ×1 IMPLANT
STENT ONYX FRONTIER 2.25X18 (Permanent Stent) ×1 IMPLANT
STENT ONYX FRONTIER 2.5X26 (Permanent Stent) ×1 IMPLANT
STENT ONYX FRONTIER 2.75X38 (Permanent Stent) ×1 IMPLANT
STENT ONYX FRONTIER 3.0X38 (Permanent Stent) ×2 IMPLANT
STENT ONYX FRONTIER 3.5X26 (Permanent Stent) ×1 IMPLANT
TUBING CIL FLEX 10 FLL-RA (TUBING) ×1 IMPLANT
WIRE EMERALD 3MM-J .035X260CM (WIRE) ×1 IMPLANT
WIRE GUIDERIGHT .035X150 (WIRE) ×1 IMPLANT
WIRE HITORQ VERSACORE ST 145CM (WIRE) ×1 IMPLANT
WIRE RUNTHROUGH .014X180CM (WIRE) ×1 IMPLANT

## 2021-09-23 SURGICAL SUPPLY — 20 items
BALLN TREK RX 2.5X15 (BALLOONS) ×2
BALLOON TREK RX 2.5X15 (BALLOONS) IMPLANT
CATH 5F 110X4 TIG (CATHETERS) ×1 IMPLANT
CATH EXTRAC PRONTO 5.5F 138CM (CATHETERS) ×1 IMPLANT
CATH INFINITI 5 FR IM (CATHETERS) ×1 IMPLANT
CATH VISTA GUIDE 6FR IM 90 CM (CATHETERS) ×1 IMPLANT
DEVICE RAD TR BAND REGULAR (VASCULAR PRODUCTS) ×1 IMPLANT
DRAPE BRACHIAL (DRAPES) ×1 IMPLANT
GLIDESHEATH SLEND SS 6F .021 (SHEATH) ×1 IMPLANT
GUIDEWIRE INQWIRE 1.5J.035X260 (WIRE) IMPLANT
INQWIRE 1.5J .035X260CM (WIRE) ×2
KIT ENCORE 26 ADVANTAGE (KITS) ×1 IMPLANT
PACK CARDIAC CATH (CUSTOM PROCEDURE TRAY) ×2 IMPLANT
PAD ONESTEP ZOLL R SERIES ADT (MISCELLANEOUS) ×1 IMPLANT
PROTECTION STATION PRESSURIZED (MISCELLANEOUS) ×2
SET ATX SIMPLICITY (MISCELLANEOUS) ×1 IMPLANT
STATION PROTECTION PRESSURIZED (MISCELLANEOUS) IMPLANT
STENT ONYX FRONTIER 3.5X18 (Permanent Stent) ×1 IMPLANT
TUBING CIL FLEX 10 FLL-RA (TUBING) ×1 IMPLANT
WIRE RUNTHROUGH .014X180CM (WIRE) ×1 IMPLANT

## 2021-09-23 NOTE — Interval H&P Note (Signed)
Cath Lab Visit (complete for each Cath Lab visit) ? ?Clinical Evaluation Leading to the Procedure:  ? ?ACS: No. ? ?Non-ACS:   ? ?Anginal Classification: CCS III ? ?Anti-ischemic medical therapy: Minimal Therapy (1 class of medications) ? ?Non-Invasive Test Results: No non-invasive testing performed ? ?Prior CABG: Previous CABG ? ? ? ? ? ?History and Physical Interval Note: ? ?09/23/2021 ?11:29 AM ? ?Jeffery Sweeney  has presented today for surgery, with the diagnosis of LT Heart Cath w graft   Unstable Angina.  The various methods of treatment have been discussed with the patient and family. After consideration of risks, benefits and other options for treatment, the patient has consented to  Procedure(s): ?LEFT HEART CATH AND CORS/GRAFTS ANGIOGRAPHY (N/A) as a surgical intervention.  The patient's history has been reviewed, patient examined, no change in status, stable for surgery.  I have reviewed the patient's chart and labs.  Questions were answered to the patient's satisfaction.   ? ? ?Kathlyn [place] ? ? ?

## 2021-09-23 NOTE — Progress Notes (Signed)
Patient took 325 mg aspirin at home this morning.  ?

## 2021-09-23 NOTE — H&P (Signed)
?Cardiology Admission History and Physical:  ? ?Patient ID: Jeffery Sweeney ?MRN: [id number]; DOB: Sep 14, 1960  ? ?Admission date: 09/23/2021 ? ?PCP:  Iran Ouch, MD ?  ?[facility] Providers ?Cardiologist:  None  ? ? ?Chief Complaint: Chest pain ? ?Patient Profile:  ? ?Jeffery Sweeney is a 61 y.o. male with coronary artery disease who is being seen 09/23/2021 for the evaluation of recurrent chest pain and evidence of anterolateral ST elevation. ? ?History of Present Illness:  ? ?[name] is a 61 year old male with previous myocardial infarction with subsequent PCI and ultimately CABG times 21 June 2020, essential hypertension, hyperlipidemia, previous stroke and previous history of tobacco use. ? ?He was seen recently for chest pain.  He reports having a severe episode recently lasting for few hours and described as substernal heaviness that was very intense and very similar to his prior myocardial infarction.  Since that time, he experienced multiple episodes of chest pain mostly with exertion.  He was seen in our office by Cadence Furth and given his symptoms that are suggestive of unstable angina, he was referred for left heart catheterization possible PCI. ? ?Cardiac catheterization was done by me earlier today via the left radial artery which showed significant underlying three-vessel coronary artery disease with patent grafts including SVG to right PDA, SVG to diagonal and LIMA to LAD.  However, there was evidence of significant ostial stenosis and LIMA to LAD with significant pressure dampening with catheter engagement in spite of giving intra-arterial nitroglycerin.  During angiography, the patient started having chest pain and there was loss of flow in LIMA to LAD.  Thus, PCI was performed with placement of an ostial stent as well as aspiration thrombectomy with establishment of TIMI-3 flow.  There was residual thrombus and thus the patient was started on Aggrastat.  There was the appearance of  what seems to be dissection in the LIMA but it was nonflow limiting and I elected to attempt medical therapy with antiplatelet medications given that the flow was normal in the LAD distally.  The patient was chest pain-free and continued to do well for about 6 hours but around 6 PM, he started having recurrent chest pain with elevated blood pressure, repeat EKG showed few millimeters of anterolateral ST elevation.  Based on this, I recommended proceeding with relook angiography and possible PCI. ? ? ?Past Medical History:  ?Diagnosis Date  ? Hypertension   ? Multiple vessel coronary artery disease   ? Myocardial infarct (HCC)   ? x 3; PCI with single stent placement for each (in [place])  ? NSTEMI (non-ST elevated myocardial infarction) (HCC)   ? LHC 2/18 with multivessel CAD and transfer to [facility] for CABG eval  ? Tobacco use   ? 2 cigarettes daily  ? ? ?Past Surgical History:  ?Procedure Laterality Date  ? CORONARY ANGIOPLASTY WITH STENT PLACEMENT    ? in [place] in 2018-19  ? CORONARY ARTERY BYPASS GRAFT N/A 07/09/2020  ? Procedure: CORONARY ARTERY BYPASS GRAFTING (CABG) x THREE , USING LEFT INTERNAL MAMMARY ARTERY, AND RIGHT LEG GREATER SAPHENOUS VEIN HARVESTED ENDOSCOPICALLY;  Surgeon: Delight Ovens, MD;  Location: [facility] OR;  Service: Open Heart Surgery;  Laterality: N/A;  ? INTRAVASCULAR PRESSURE WIRE/FFR STUDY N/A 07/06/2020  ? Procedure: INTRAVASCULAR PRESSURE WIRE/FFR STUDY;  Surgeon: Iran Ouch, MD;  Location: [facility] INVASIVE CV LAB;  Service: Cardiovascular;  Laterality: N/A;  LAD  ? LEFT HEART CATH AND CORONARY ANGIOGRAPHY N/A 07/06/2020  ? Procedure: LEFT HEART  CATH AND CORONARY ANGIOGRAPHY;  Surgeon: Iran Ouch, MD;  Location: [facility] INVASIVE CV LAB;  Service: Cardiovascular;  Laterality: N/A;  ? TEE WITHOUT CARDIOVERSION N/A 07/09/2020  ? Procedure: TRANSESOPHAGEAL ECHOCARDIOGRAM (TEE);  Surgeon: Delight Ovens, MD;  Location: [facility] OR;  Service: Open Heart Surgery;  Laterality: N/A;  ?  WRIST SURGERY Left   ? for cyst  ?  ? ?Medications Prior to Admission: ?Prior to Admission medications   ?Medication Sig Start Date End Date Taking? Authorizing Provider  ?clopidogrel (PLAVIX) 75 MG tablet Take 1 tablet (75 mg total) by mouth daily. 07/06/21 10/04/21 Yes [name], MD  ?meclizine (ANTIVERT) 25 MG tablet Take 1 tablet (25 mg total) by mouth 3 (three) times daily as needed for dizziness. 07/05/21  Yes Minna Antis, MD  ?metoprolol tartrate (LOPRESSOR) 25 MG tablet Take 1.5 tablets (37.5 mg total) by mouth 2 (two) times daily. 07/08/21 10/06/21 Yes Iran Ouch, MD  ?nitroGLYCERIN (NITROSTAT) 0.4 MG SL tablet Place 1 tablet (0.4 mg total) under the tongue every 5 (five) minutes as needed for chest pain. 10/05/20  Yes Marisue Ivan D, PA-C  ?pantoprazole (PROTONIX) 40 MG tablet Take 1 tablet (40 mg total) by mouth once daily. 07/08/21 10/06/21 Yes Iran Ouch, MD  ?rosuvastatin (CRESTOR) 40 MG tablet Take 1 tablet (40 mg total) by mouth once daily. 07/08/21 10/06/21 Yes Iran Ouch, MD  ?  ? ?Allergies:   No Known Allergies ? ?Social History:   ?Social History  ? ?Socioeconomic History  ? Marital status: Divorced  ?  Spouse name: Not on file  ? Number of children: Not on file  ? Years of education: Not on file  ? Highest education level: Not on file  ?Occupational History  ? Not on file  ?Tobacco Use  ? Smoking status: Former  ?  Packs/day: 0.50  ?  Types: Cigarettes  ?  Quit date: 07/06/2020  ?  Years since quitting: 1.2  ? Smokeless tobacco: Never  ?Vaping Use  ? Vaping Use: Never used  ?Substance and Sexual Activity  ? Alcohol use: Not Currently  ? Drug use: Not Currently  ? Sexual activity: Not on file  ?Other Topics Concern  ? Not on file  ?Social History Narrative  ? Not on file  ? ?Social Determinants of Health  ? ?Financial Resource Strain: Not on file  ?Food Insecurity: Not on file  ?Transportation Needs: Not on file  ?Physical Activity: Not on file  ?Stress: Not on  file  ?Social Connections: Not on file  ?Intimate Partner Violence: Not on file  ?  ?Family History:   ?The patient's family history is remarkable for coronary artery disease and COPD ?ROS:  ?Please see the history of present illness.  ?All other ROS reviewed and negative.    ? ?Physical Exam/Data:  ? ?Vitals:  ? 09/23/21 1850 09/23/21 1855 09/23/21 1900 09/23/21 1919  ?BP: (!) 152/93 (!) 145/82 (!) 157/89   ?Pulse: 64 62 65   ?Resp: 11 18 16    ?Temp:      ?TempSrc:      ?SpO2: 100% 100% 99% 98%  ?Weight:      ? ? ?Intake/Output Summary (Last 24 hours) at 09/23/2021 2211 ?Last data filed at 09/23/2021 1900 ?Gross per 24 hour  ?Intake 483.32 ml  ?Output 550 ml  ?Net -66.68 ml  ? ? ?  09/23/2021  ?  9:45 AM 09/20/2021  ?  9:30 AM 07/05/2021  ? 11:40 AM  ?  Last 3 Weights  ?Weight (lbs) 191 lb 12.8 oz 192 lb 191 lb 2.2 oz  ?Weight (kg) 87 kg 87.091 kg 86.7 kg  ?   ?Body mass index is 33.98 kg/m?.  ?General:  Well nourished, well developed, in no acute distress ?HEENT: normal ?Neck: no JVD ?Vascular: No carotid bruits; Distal pulses 2+ bilaterally   ?Cardiac:  normal S1, S2; RRR; no murmur  ?Lungs:  clear to auscultation bilaterally, no wheezing, rhonchi or rales  ?Abd: soft, nontender, no hepatomegaly  ?Ext: no edema ?Musculoskeletal:  No deformities, BUE and BLE strength normal and equal ?Skin: warm and dry  ?Neuro:  CNs 2-12 intact, no focal abnormalities noted ?Psych:  Normal affect  ? ? ?EKG:  The ECG that was done sinus rhythm with 1 to 2 mm of ST elevation in V4 and V5 was personally reviewed  ? ?Relevant CV Studies: ? ? ?Laboratory Data: ? ?High Sensitivity Troponin:  No results for input(s): TROPONINIHS in the last 720 hours.    ?Chemistry ?Recent Labs  ?Lab 09/20/21 ?1027  ?NA 139  ?K 4.2  ?CL 106  ?CO2 28  ?GLUCOSE 114*  ?BUN 17  ?CREATININE 1.00  ?CALCIUM 9.0  ?GFRNONAA >60  ?ANIONGAP 5  ?  ?No results for input(s): PROT, ALBUMIN, AST, ALT, ALKPHOS, BILITOT in the last 168 hours. ?Lipids No results for input(s): CHOL,  TRIG, HDL, LABVLDL, LDLCALC, CHOLHDL in the last 168 hours. ?Hematology ?Recent Labs  ?Lab 09/20/21 ?1027  ?WBC 8.0  ?RBC 5.26  ?HGB 15.1  ?HCT 46.0  ?MCV 87.5  ?MCH 28.7  ?MCHC 32.8  ?RDW 13.5  ?PLT 23

## 2021-09-23 NOTE — Progress Notes (Signed)
Cross Cover ? ?Called regarding 10/10 chest pain.  Patient presented to the Cath Lab today for LHC in the setting of prior CABG in 2022 (LIMA-->LAD, SVG-->PDA, SVG-->RI).  He was seen in clinic and reported chest pain with both typical and atypical features.  EKG 09/19/2021 with AMS revealed sinus rhythm and was otherwise unremarkable.  In the Cath Lab today he was found to have severe underlying three-vessel CAD with significant disease in the left circumflex that was not revascularized.  He also had 80% ostial stenosis of the LIMA to LAD.  There was loss of flow in the LIMA after diagnostic angiography thought to be due to disruption of plaque and vessel closure.  He underwent successful DES placement and aspiration thrombectomy of the LIMA to LAD with restoration of TIMI-3 flow.  However he did have residual thrombus and was started on Aggrastat infusion.  EKG at this time reveals ST elevations in leads V3 through V5.  No EKG was taken immediately after procedure.  Given the known residual thrombus in the procedure, the certainly could be causing his EKG changes.  However given his symptoms his presentation is certainly worrisome for repeat obstruction.  Blood pressure is very elevated with systolics in the [id number].  We will start nitroglycerin infusion.  We will also check cardiac enzymes and give 2 mg of morphine.  If he continues to have persistent symptoms or EKG does not improve, we will need to take him back to the Cath Lab this evening. ? ?Joshu Furukawa C. Oval Linsey, MD, [facility] ?09/23/2021 ?6:20 PM ? ?

## 2021-09-23 NOTE — Progress Notes (Signed)
Pt noted to have 10/10 chest pain, writhing in bed while eating dinne, substernal in nature. No N,V, or diaphoresis. Affirms SOB with nod, O2 placed at 2 LNC. Pt given SL NTG x 1, 12 lead EKG done and covering cards (Dr. Horris Latino) notified. Received orders for morphine 2 mg and NTG drip. 12 lead EKG was noted to have st elevation in ant leads,, esp V3. NSR, occ PVC's. No relief  after morphine and SL NTG. Within minutes of pts onset symptoms, Dr. Kirke Corin in to see patient and call team was activated by Dr. Kirke Corin. Pt transported to cath lab via bed accompanied by charge nurse and Dr. Kirke Corin. Upon departure from ICU room 3 pt continued to complain of 9-10 intensity CP. Defib pads in place and pt had aggrastat, NS, and IV NTG infusing at 35 mcg/min.   ?

## 2021-09-24 ENCOUNTER — Encounter: Payer: Self-pay | Admitting: Cardiovascular Disease

## 2021-09-24 ENCOUNTER — Inpatient Hospital Stay (HOSPITAL_COMMUNITY)
Admission: RE | Admit: 2021-09-24 | Discharge: 2021-09-24 | Disposition: A | Discharge status: Home / Self Care | Payer: Self-pay | Source: Home / Self Care | Attending: Cardiovascular Disease | Admitting: Cardiovascular Disease

## 2021-09-24 ENCOUNTER — Other Ambulatory Visit (HOSPITAL_COMMUNITY): Payer: Self-pay

## 2021-09-24 DIAGNOSIS — R079 Chest pain, unspecified: Secondary | ICD-10-CM

## 2021-09-24 DIAGNOSIS — I2102 ST elevation (STEMI) myocardial infarction involving left anterior descending coronary artery: Secondary | ICD-10-CM

## 2021-09-24 DIAGNOSIS — I200 Unstable angina: Secondary | ICD-10-CM

## 2021-09-24 DIAGNOSIS — I5031 Acute diastolic (congestive) heart failure: Secondary | ICD-10-CM

## 2021-09-24 DIAGNOSIS — E785 Hyperlipidemia, unspecified: Secondary | ICD-10-CM

## 2021-09-24 LAB — CBC
HCT: 38.2 % — ABNORMAL LOW (ref 39.0–52.0)
Hemoglobin: 12.7 g/dL — ABNORMAL LOW (ref 13.0–17.0)
MCH: 29 pg (ref 26.0–34.0)
MCHC: 33.2 g/dL (ref 30.0–36.0)
MCV: 87.2 fL (ref 80.0–100.0)
Platelets: 205 10*3/uL (ref 150–400)
RBC: 4.38 MIL/uL (ref 4.22–5.81)
RDW: 13.2 % (ref 11.5–15.5)
WBC: 8.4 10*3/uL (ref 4.0–10.5)
nRBC: 0 % (ref 0.0–0.2)

## 2021-09-24 LAB — ECHOCARDIOGRAM COMPLETE
AR max vel: 1.57 cm2
AV Area VTI: 1.56 cm2
AV Area mean vel: 1.41 cm2
AV Mean grad: 3 mmHg
AV Peak grad: 5 mmHg
Ao pk vel: 1.12 m/s
Area-P 1/2: 3.28 cm2
MV VTI: 1.69 cm2
S' Lateral: 2.29 cm
Weight: 3068.8 oz

## 2021-09-24 LAB — BASIC METABOLIC PANEL
Anion gap: 5 (ref 5–15)
BUN: 15 mg/dL (ref 8–23)
CO2: 22 mmol/L (ref 22–32)
Calcium: 8.3 mg/dL — ABNORMAL LOW (ref 8.9–10.3)
Chloride: 108 mmol/L (ref 98–111)
Creatinine, Ser: 0.89 mg/dL (ref 0.61–1.24)
GFR, Estimated: >60 mL/min (ref >60)
Glucose, Bld: 112 mg/dL — ABNORMAL HIGH (ref 70–99)
Potassium: 3.7 mmol/L (ref 3.5–5.1)
Sodium: 135 mmol/L (ref 135–145)

## 2021-09-24 LAB — TROPONIN I (HIGH SENSITIVITY): Troponin I (High Sensitivity): 9688 ng/L (ref <18)

## 2021-09-24 MED ORDER — FUROSEMIDE 10 MG/ML IJ SOLN
20.0000 mg | Freq: Every day | INTRAMUSCULAR | Status: DC
Start: 2021-09-24 — End: 2021-09-26
  Administered 2021-09-24 – 2021-09-25 (×2): 20 mg via INTRAVENOUS
  Filled 2021-09-24 (×2): qty 2

## 2021-09-24 MED ORDER — METOPROLOL TARTRATE 50 MG PO TABS
50.0000 mg | ORAL_TABLET | Freq: Two times a day (BID) | ORAL | Status: DC
Start: 2021-09-24 — End: 2021-09-26
  Administered 2021-09-24 – 2021-09-25 (×4): 50 mg via ORAL
  Filled 2021-09-24 (×4): qty 1

## 2021-09-24 MED ORDER — HYDRALAZINE HCL 20 MG/ML IJ SOLN: 10.0000 mg | INTRAMUSCULAR | Status: DC | PRN

## 2021-09-24 MED ORDER — TIROFIBAN HCL IN NACL 5-0.9 MG/100ML-% IV SOLN
0.1500 ug/kg/min | INTRAVENOUS | Status: AC
  Administered 2021-09-24: 0.15 ug/kg/min via INTRAVENOUS
  Filled 2021-09-24 (×2): qty 100

## 2021-09-24 NOTE — Progress Notes (Signed)
? ?Progress Note ? ?Patient Name: Jeffery FischerRobert E Sweeney ?Date of Encounter: 09/24/2021 ? ?[facility] Cardiologist: Jeffery BearsMuhammad Arida, MD ? ?Subjective  ? ?Patient complains of soreness in the left upper chest, worsened with deep inspiration.  It was present even before yesterday's two PCI's to the LIMA-LAD.  He reports mild shortness of breath. ? ?Inpatient Medications  ?  ?Scheduled Meds: ? aspirin  81 mg Oral Daily  ? metoprolol tartrate  37.5 mg Oral BID  ? pantoprazole  40 mg Oral Daily  ? rosuvastatin  40 mg Oral Daily  ? sodium chloride flush  3 mL Intravenous Q12H  ? sodium chloride flush  3 mL Intravenous Q12H  ? sodium chloride flush  3 mL Intravenous Q12H  ? ticagrelor  90 mg Oral BID  ? ?Continuous Infusions: ? sodium chloride    ? sodium chloride 1 mL/kg/hr (09/23/21 2245)  ? nitroGLYCERIN 15 mcg/min (09/23/21 2351)  ? tirofiban 0.15 mcg/kg/min (09/24/21 [id number])  ? ?PRN Meds: ?sodium chloride, acetaminophen, meclizine, morphine injection, nitroGLYCERIN, ondansetron (ZOFRAN) IV, sodium chloride flush, sodium chloride flush  ? ?Vital Signs  ?  ?Vitals:  ? 09/24/21 0600 09/24/21 0615 09/24/21 0630 09/24/21 0738  ?BP:  140/81 138/83 126/78  ?Pulse: 77 71 71 75  ?Resp: 15 15 12 16   ?Temp:    98.2 ?F (36.8 ?C)  ?TempSrc:    Oral  ?SpO2: 96% 95% 96% 96%  ?Weight:      ? ? ?Intake/Output Summary (Last 24 hours) at 09/24/2021 0844 ?Last data filed at 09/24/2021 [id number] ?Gross per 24 hour  ?Intake 2093.71 ml  ?Output 950 ml  ?Net 1143.71 ml  ? ? ?  09/23/2021  ?  9:45 AM 09/20/2021  ?  9:30 AM 07/05/2021  ? 11:40 AM  ?Last 3 Weights  ?Weight (lbs) 191 lb 12.8 oz 192 lb 191 lb 2.2 oz  ?Weight (kg) 87 kg 87.091 kg 86.7 kg  ?   ? ?Telemetry  ?  ?NSR - Personally Reviewed ? ?ECG  ?  ?No new tracing ? ?Physical Exam  ? ?GEN: No acute distress.   ?Neck: No JVD ?Cardiac: RRR, no murmurs, rubs, or gallops.  ?Respiratory: Clear to auscultation bilaterally.  Left radial cath site covered with clean dressing.  No hematoma.  Right femoral cath  site with mild bruising.  No hematoma or bruit.  2+ pedal pulses bilaterally. ?GI: Soft, nontender, non-distended  ?MS: No edema; No deformity. ?Neuro:  Nonfocal  ?Psych: Normal affect  ? ?Labs  ?  ?High Sensitivity Troponin:   ?Recent Labs  ?Lab 09/23/21 ?2220 09/24/21 ?0018  ?TROPONINIHS 5,4094,[id number]* [id number]*  ?   ?Chemistry ?Recent Labs  ?Lab 09/20/21 ?1027 09/24/21 ?0310  ?NA 139 135  ?K 4.2 3.7  ?CL 106 108  ?CO2 28 22  ?GLUCOSE 114* 112*  ?BUN 17 15  ?CREATININE 1.00 0.89  ?CALCIUM 9.0 8.3*  ?GFRNONAA >60 >60  ?ANIONGAP 5 5  ?  ?Lipids No results for input(s): CHOL, TRIG, HDL, LABVLDL, LDLCALC, CHOLHDL in the last 168 hours.  ?Hematology ?Recent Labs  ?Lab 09/20/21 ?1027 09/23/21 ?2220 09/24/21 ?0310  ?WBC 8.0  --  8.4  ?RBC 5.26  --  4.38  ?HGB 15.1  --  12.7*  ?HCT 46.0  --  38.2*  ?MCV 87.5  --  87.2  ?MCH 28.7  --  29.0  ?MCHC 32.8  --  33.2  ?RDW 13.5  --  13.2  ?PLT 236 208 205  ? ?Thyroid No results for input(s):  TSH, FREET4 in the last 168 hours.  ?BNPNo results for input(s): BNP, PROBNP in the last 168 hours.  ?DDimer No results for input(s): DDIMER in the last 168 hours.  ? ?Radiology  ?  ?CARDIAC CATHETERIZATION ? ?Result Date: 09/23/2021 ?1.  Acute closure of LIMA to LAD beyond the previously stented segment due to what seems to be spiral dissection throughout the whole graft to the anastomosis.  Native vessel angiography showed that the LAD did not supply apical flow in spite of an occluded LIMA. 2.  Successful aspiration thrombectomy, balloon angioplasty and 7 overlapped drug-eluting stents to cover the whole length of the LIMA to beyond the anastomosis and establish TIMI-3 flow. Recommendations: Continue Aggrastat infusion for another 12 hours. Switch clopidogrel to ticagrelor The LIMA graft is at high risk for target vessel failure given multiple overlapped stents but the supplied area is not very large given that the mid and proximal LAD distribution are supplied by native vessel flow.  ? ?CARDIAC  CATHETERIZATION ? ?Result Date: 09/23/2021 ?  Dist RCA lesion is 30% stenosed.   2nd Diag lesion is 80% stenosed.   1st Diag lesion is 90% stenosed.   Lat 1st Diag lesion is 90% stenosed.   RPDA lesion is 90% stenosed.   Mid Cx to Dist Cx lesion is 80% stenosed.   2nd Mrg lesion is 80% stenosed.   Prox LAD to Mid LAD lesion is 80% stenosed.   Prox RCA to Mid RCA lesion is 90% stenosed.   Origin to Mid Graft lesion before Prox RCA  is 30% stenosed.   Origin to Prox Graft lesion is 80% stenosed.   A drug-eluting stent was successfully placed using a STENT ONYX FRONTIER 3.5X18.   Post intervention, there is a 0% residual stenosis.   SVG.   The graft exhibits mild .   The left ventricular systolic function is normal.   LV Jeffery Sweeney diastolic pressure is mildly elevated.   The left ventricular ejection fraction is 55-65% by visual estimate. 1.  Severe underlying three-vessel coronary artery disease with patent grafts including LIMA to LAD, SVG to diagonal and SVG to right PDA.  The left circumflex has significant disease but is not bypassed due to small vessel size.  The vessel is at most 2 mm in diameter.  The LIMA to LAD had an 80% ostial stenosis with hazy appearance.  There was loss of flow in the LIMA after diagnostic angiography likely due to disruption of plaque and vessel closure. 2.  Normal LV systolic function mildly elevated left ventricular Jeffery Sweeney-diastolic pressure. 3.  Successful angioplasty, drug-eluting stent placement and aspiration thrombectomy to LIMA to LAD with restoration of TIMI-3 flow.  Residual thrombus was still present and thus Aggrastat infusion was started and will be continued for 18 hours. Recommendations: Dual antiplatelet therapy for at least 12 months. Aggressive treatment of risk factors. We will monitor the patient closely in the stepdown ICU given increased risk of acute vessel closure.   ? ?Cardiac Studies  ? ?See above ? ?Patient Profile  ?   ?61 y.o. male with a hx of  self reported MI x3 (5  years prior) with subsequent PCI and CABGx3, HTN, h/o tobacco use, HLD, h/o stroke 06/2021, admitted after outpatient cath for unstable angina with stenosis at ostium of LIMA complicated by dissection requiring repeat cath due to abrupt closure of LIMA-LAD.  He is now s/p 8 sents to the LIMA-LAD. ? ?Assessment & Plan  ?  ?STEMI: ?Patient initially presented  with unstable angina and was found to have lesion at the ostium of LIMA graft.  There was suspicion for dissection of the graft during diagnostic catheterization prompting DES x 1 at the ostium.  The patient had recurrent chest pain with new anterior ST elevation yesterday evening that led to repeat cath; abrupt closure of the LIMA-LAD distal to stent was observed with evidence of spiral dissection to the distal anastomosis.  An additional 7 stents were placed in overlapping fashion, extending into native LAD.  Patient is now chest pain free other than mld "soreness" that actually predates yesterday's caths. ?- Continue tirofiban to complete 18 hours from yesterday's initial intervention. ?- Recommend indefinite DAPT with aspirin and ticagrelor. ?- Wean off NTG infusion. ?- Increase metoprolol tartrate to 50 mg BID. ?- Continue rosuvastatin 40 mg daily. ?- Follow-up echocardiogram. ? ?Acute HFpEF: ?Mild shortness of breath reported this AM.  LVEDP ~25 mmHg during second cath yesterday.  LVEF may be reduced following abrupt occlusion of LIMA-LAD yesterday. ?- Follow-up echocardiogram. ?- Start furosemide 20 mg IV daily. ?- Increase metoprolol tartrate to 50 mg BID; may need to transition to evidence-based beta-blocker if LVEF significantly reduced. ? ?Hyperlipidemia: ?- Continue rosuvastatin 40 mg daily. ? ?Disposition: ?- Consider transfer to telemetry this afternoon after completion of tirofiban infusion if symptoms remain stable and NTG has been weaned off. ? ?For questions or updates, please contact [facility] ?Please consult [email or web address] for contact info  under [facility].  ?   ?Signed, ?Yvonne Kendall, MD  ?09/24/2021, 8:44 AM   ? ?

## 2021-09-24 NOTE — Progress Notes (Signed)
MEDICATION RELATED CONSULT NOTE - INITIAL  ? ?Pharmacy Consult for Tirofiban  ?Indication:  post - Cath ? ?No Known Allergies ? ?Patient Measurements: ?Weight: 87 kg (191 lb 12.8 oz) ?Adjusted Body Weight:  ? ?Vital Signs: ?Temp: 97.9 ?F (36.6 ?C) (05/08 2215) ?Temp Source: Oral (05/08 2215) ?BP: 124/71 (05/09 0200) ?Pulse Rate: 69 (05/09 0200) ?Intake/Output from previous day: ?05/08 0701 - 05/09 0700 ?In: 1425.2 [P.O.:340; I.V.:1085.2] ?Out: 950 [Urine:950] ?Intake/Output from this shift: ?Total I/O ?In: 941.9 [P.O.:100; I.V.:841.9] ?Out: 400 [Urine:400] ? ?Labs: ?Recent Labs  ?  09/23/21 ?2220  ?PLT 208  ? ?Estimated Creatinine Clearance: 75.6 mL/min (by C-G formula based on SCr of 1 mg/dL). ? ? ?Microbiology: ?No results found for this or any previous visit (from the past 720 hour(s)). ? ?Medical History: ?Past Medical History:  ?Diagnosis Date  ? Hypertension   ? Multiple vessel coronary artery disease   ? Myocardial infarct (HCC)   ? x 3; PCI with single stent placement for each (in [place])  ? NSTEMI (non-ST elevated myocardial infarction) (HCC)   ? LHC 2/18 with multivessel CAD and transfer to [facility] for CABG eval  ? Tobacco use   ? 2 cigarettes daily  ? ? ?Medications:  ?Facility-Administered Medications Prior to Admission  ?Medication Dose Route Frequency Provider Last Rate Last Admin  ? sodium chloride flush (NS) 0.9 % injection 3 mL  3 mL Intravenous Q12H Furth, Cadence H, PA-C      ? ?Medications Prior to Admission  ?Medication Sig Dispense Refill Last Dose  ? clopidogrel (PLAVIX) 75 MG tablet Take 1 tablet (75 mg total) by mouth daily. 90 tablet 0 09/23/2021  ? meclizine (ANTIVERT) 25 MG tablet Take 1 tablet (25 mg total) by mouth 3 (three) times daily as needed for dizziness. 30 tablet 0 09/22/2021  ? metoprolol tartrate (LOPRESSOR) 25 MG tablet Take 1.5 tablets (37.5 mg total) by mouth 2 (two) times daily. 270 tablet 0 09/23/2021  ? nitroGLYCERIN (NITROSTAT) 0.4 MG SL tablet Place 1 tablet (0.4 mg total)  under the tongue every 5 (five) minutes as needed for chest pain. 25 tablet 3 Past Week  ? pantoprazole (PROTONIX) 40 MG tablet Take 1 tablet (40 mg total) by mouth once daily. 90 tablet 0 09/23/2021  ? rosuvastatin (CRESTOR) 40 MG tablet Take 1 tablet (40 mg total) by mouth once daily. 90 tablet 0 09/23/2021  ? ? ?Assessment: ?Pharmacy consulted to dose tirofiban in this 61 year old who underwent Cath on 5/8 @ 1200.  Pt began re-experiencing chest pain around 1900 so pt underwent an additional cath with stent placement @ ~ 2200.   ? ?CrCl = 75.6 ml/min  ? ?Goal of Therapy:  ? ? ?Plan:  ?Tirofiban 0.15 mcg/kg/min (15.66 ml/hr) started on 5/8 @ 1233.  Pt underwent additional cath procedure @ ~ 2200 so will continue tirofiban for 12 hrs post cath until 05/09 @ 1000.  ? ?Jeffery Sweeney D ?09/24/2021,2:10 AM ? ? ? ?

## 2021-09-24 NOTE — Plan of Care (Signed)
  Problem: Education: Goal: Knowledge of General Education information will improve Description Including pain rating scale, medication(s)/side effects and non-pharmacologic comfort measures Outcome: Progressing   

## 2021-09-25 ENCOUNTER — Other Ambulatory Visit: Payer: Self-pay

## 2021-09-25 ENCOUNTER — Encounter: Payer: Self-pay | Admitting: Cardiovascular Disease

## 2021-09-25 LAB — CBC
HCT: 43 % (ref 39.0–52.0)
Hemoglobin: 14.4 g/dL (ref 13.0–17.0)
MCH: 28.6 pg (ref 26.0–34.0)
MCHC: 33.5 g/dL (ref 30.0–36.0)
MCV: 85.5 fL (ref 80.0–100.0)
Platelets: 176 10*3/uL (ref 150–400)
RBC: 5.03 MIL/uL (ref 4.22–5.81)
RDW: 13.4 % (ref 11.5–15.5)
WBC: 8.4 10*3/uL (ref 4.0–10.5)
nRBC: 0 % (ref 0.0–0.2)

## 2021-09-25 LAB — BASIC METABOLIC PANEL
Anion gap: 5 (ref 5–15)
BUN: 15 mg/dL (ref 8–23)
CO2: 26 mmol/L (ref 22–32)
Calcium: 8.8 mg/dL — ABNORMAL LOW (ref 8.9–10.3)
Chloride: 106 mmol/L (ref 98–111)
Creatinine, Ser: 0.88 mg/dL (ref 0.61–1.24)
GFR, Estimated: >60 mL/min (ref >60)
Glucose, Bld: 107 mg/dL — ABNORMAL HIGH (ref 70–99)
Potassium: 3.7 mmol/L (ref 3.5–5.1)
Sodium: 137 mmol/L (ref 135–145)

## 2021-09-25 LAB — LIPOPROTEIN A (LPA): Lipoprotein (a): 249 nmol/L — ABNORMAL HIGH (ref <75.0)

## 2021-09-25 MED ORDER — TICAGRELOR 90 MG PO TABS
90.0000 mg | ORAL_TABLET | Freq: Two times a day (BID) | ORAL | 0 refills | Status: DC
  Filled 2021-09-25: qty 60, 30d supply, fill #0

## 2021-09-25 MED ORDER — REPATHA SURECLICK 140 MG/ML ~~LOC~~ SOAJ
140.0000 mg | SUBCUTANEOUS | 0 refills | Status: DC
  Filled 2021-09-25: qty 2, 28d supply, fill #0

## 2021-09-25 NOTE — Progress Notes (Signed)
Repatha Sureclick 150mg  counseling completed. Including indication, cost, administration and common side effects. ? ?30 day free card provided and meds to bed done. ? ?Markis Langland Rodriguez-Guzman PharmD, BCPS ?09/25/2021 4:01 PM ? ?

## 2021-09-25 NOTE — Progress Notes (Signed)
Report called to 2A RN, Damanjeet Gandi, receiving pt into room 256. Pt transported to room via bed accompanied by NT.  ?

## 2021-09-25 NOTE — Progress Notes (Signed)
Attempted to call report to receiving nurse. Nurse unable to take report at this time.  ?

## 2021-09-25 NOTE — Progress Notes (Signed)
? ?Progress Note ? ?Patient Name: Jeffery Sweeney ?Date of Encounter: 09/25/2021 ? ?[facility] Cardiologist: Lorine Bears, MD ? ?Subjective  ? ?Patient seen on AM rounds. Denies any chest pain or shortness of breath. Nitro drip has been weaned off. Echocardiogram completed yesterday. Vital signs have been stable overnight.  ? ?Inpatient Medications  ?  ?Scheduled Meds: ? aspirin  81 mg Oral Daily  ? furosemide  20 mg Intravenous Daily  ? metoprolol tartrate  50 mg Oral BID  ? pantoprazole  40 mg Oral Daily  ? rosuvastatin  40 mg Oral Daily  ? sodium chloride flush  3 mL Intravenous Q12H  ? sodium chloride flush  3 mL Intravenous Q12H  ? sodium chloride flush  3 mL Intravenous Q12H  ? ticagrelor  90 mg Oral BID  ? ?Continuous Infusions: ? sodium chloride    ? ?PRN Meds: ?sodium chloride, acetaminophen, hydrALAZINE, meclizine, morphine injection, nitroGLYCERIN, ondansetron (ZOFRAN) IV, sodium chloride flush, sodium chloride flush  ? ?Vital Signs  ?  ?Vitals:  ? 09/25/21 0300 09/25/21 0400 09/25/21 0500 09/25/21 0600  ?BP: 117/64 115/66 118/77   ?Pulse: 68 77 70 78  ?Resp: 16 17 13  (!) 25  ?Temp: 98.1 ?F (36.7 ?C)     ?TempSrc: Oral     ?SpO2: 96% 94% 94% 95%  ?Weight:      ? ? ?Intake/Output Summary (Last 24 hours) at 09/25/2021 0827 ?Last data filed at 09/25/2021 0300 ?Gross per 24 hour  ?Intake 211.05 ml  ?Output 4100 ml  ?Net -3888.95 ml  ? ? ?  09/23/2021  ?  9:45 AM 09/20/2021  ?  9:30 AM 07/05/2021  ? 11:40 AM  ?Last 3 Weights  ?Weight (lbs) 191 lb 12.8 oz 192 lb 191 lb 2.2 oz  ?Weight (kg) 87 kg 87.091 kg 86.7 kg  ?   ? ?Telemetry  ?  ?NSR - Personally Reviewed ? ?ECG  ?  ?Last study completed 09/23/2021 ? ?Physical Exam  ? ?GEN: No acute distress.   ?Neck: No JVD ?Cardiac: RRR, no murmurs, rubs, or gallops.  ?Respiratory: Clear to auscultation bilaterally. Respirations are unlabored on room-air.  ?GI: Soft, nontender, non-distended  ?MS: No edema; No deformity. Right groin cath site without bleeding or  hematoma, bruising noted, tenderness to palpitation, gauze and opsite dressing clean, dry, and intact to site not removed at this time, 2+ pulses bilateral radials and bilateral DP and PT ?Neuro:  Nonfocal  ?Psych: Normal affect  ? ?Labs  ?  ?High Sensitivity Troponin:   ?Recent Labs  ?Lab 09/23/21 ?2220 09/24/21 ?0018  ?TROPONINIHS 0,814* [id number]*  ?   ?Chemistry ?Recent Labs  ?Lab 09/20/21 ?1027 09/24/21 ?0310  ?NA 139 135  ?K 4.2 3.7  ?CL 106 108  ?CO2 28 22  ?GLUCOSE 114* 112*  ?BUN 17 15  ?CREATININE 1.00 0.89  ?CALCIUM 9.0 8.3*  ?GFRNONAA >60 >60  ?ANIONGAP 5 5  ?  ?Lipids No results for input(s): CHOL, TRIG, HDL, LABVLDL, LDLCALC, CHOLHDL in the last 168 hours.  ?Hematology ?Recent Labs  ?Lab 09/20/21 ?1027 09/23/21 ?2220 09/24/21 ?0310 09/25/21 ?0809  ?WBC 8.0  --  8.4 8.4  ?RBC 5.26  --  4.38 5.03  ?HGB 15.1  --  12.7* 14.4  ?HCT 46.0  --  38.2* 43.0  ?MCV 87.5  --  87.2 85.5  ?MCH 28.7  --  29.0 28.6  ?MCHC 32.8  --  33.2 33.5  ?RDW 13.5  --  13.2 13.4  ?PLT 236  208 205 176  ? ?Thyroid No results for input(s): TSH, FREET4 in the last 168 hours.  ?BNPNo results for input(s): BNP, PROBNP in the last 168 hours.  ?DDimer No results for input(s): DDIMER in the last 168 hours.  ? ?Radiology  ?  ?CARDIAC CATHETERIZATION ? ?Result Date: 09/23/2021 ?1.  Acute closure of LIMA to LAD beyond the previously stented segment due to what seems to be spiral dissection throughout the whole graft to the anastomosis.  Native vessel angiography showed that the LAD did not supply apical flow in spite of an occluded LIMA. 2.  Successful aspiration thrombectomy, balloon angioplasty and 7 overlapped drug-eluting stents to cover the whole length of the LIMA to beyond the anastomosis and establish TIMI-3 flow. Recommendations: Continue Aggrastat infusion for another 12 hours. Switch clopidogrel to ticagrelor The LIMA graft is at high risk for target vessel failure given multiple overlapped stents but the supplied area is not very large  given that the mid and proximal LAD distribution are supplied by native vessel flow.  ? ?CARDIAC CATHETERIZATION ? ?Result Date: 09/23/2021 ?  Dist RCA lesion is 30% stenosed.   2nd Diag lesion is 80% stenosed.   1st Diag lesion is 90% stenosed.   Lat 1st Diag lesion is 90% stenosed.   RPDA lesion is 90% stenosed.   Mid Cx to Dist Cx lesion is 80% stenosed.   2nd Mrg lesion is 80% stenosed.   Prox LAD to Mid LAD lesion is 80% stenosed.   Prox RCA to Mid RCA lesion is 90% stenosed.   Origin to Mid Graft lesion before Prox RCA  is 30% stenosed.   Origin to Prox Graft lesion is 80% stenosed.   A drug-eluting stent was successfully placed using a STENT ONYX FRONTIER 3.5X18.   Post intervention, there is a 0% residual stenosis.   SVG.   The graft exhibits mild .   The left ventricular systolic function is normal.   LV end diastolic pressure is mildly elevated.   The left ventricular ejection fraction is 55-65% by visual estimate. 1.  Severe underlying three-vessel coronary artery disease with patent grafts including LIMA to LAD, SVG to diagonal and SVG to right PDA.  The left circumflex has significant disease but is not bypassed due to small vessel size.  The vessel is at most 2 mm in diameter.  The LIMA to LAD had an 80% ostial stenosis with hazy appearance.  There was loss of flow in the LIMA after diagnostic angiography likely due to disruption of plaque and vessel closure. 2.  Normal LV systolic function mildly elevated left ventricular end-diastolic pressure. 3.  Successful angioplasty, drug-eluting stent placement and aspiration thrombectomy to LIMA to LAD with restoration of TIMI-3 flow.  Residual thrombus was still present and thus Aggrastat infusion was started and will be continued for 18 hours. Recommendations: Dual antiplatelet therapy for at least 12 months. Aggressive treatment of risk factors. We will monitor the patient closely in the stepdown ICU given increased risk of acute vessel closure.   ? ?ECHOCARDIOGRAM COMPLETE ? ?Result Date: 09/24/2021 ?   ECHOCARDIOGRAM REPORT   Patient Name:   Jeffery Sweeney Date of Exam: 09/24/2021 Medical Rec #:  [id number]        Height:       63.0 in Accession #:    [id number]       Weight:       191.8 lb Date of Birth:  Oct 17, 1960  BSA:          1.900 m? Patient Age:    61 years         BP:           150/78 mmHg Patient Gender: M                HR:           73 bpm. Exam Location:  [facility] Procedure: 2D Echo, Cardiac Doppler and Color Doppler Indications:     I29.9 Acute Myocardial Infarction  History:         Patient has prior history of Echocardiogram examinations. CAD,                  Prior CABG and Stent; Risk Factors:Former Smoker and                  Hypertension.  Sonographer:     Rosalia Hammers Referring Phys:  [id number] A ARIDA Diagnosing Phys: Nelva Bush MD  Sonographer Comments: Suboptimal subcostal window. Image acquisition challenging due to respiratory motion. IMPRESSIONS  1. Left ventricular ejection fraction, by estimation, is 50 to 55%. The left ventricle has low normal function. The left ventricle demonstrates regional wall motion abnormalities (see scoring diagram/findings for description). There is mild left ventricular hypertrophy. Left ventricular diastolic parameters were normal.  2. Right ventricular systolic function is normal. The right ventricular size is normal. There is normal pulmonary artery systolic pressure.  3. The mitral valve is normal in structure. Trivial mitral valve regurgitation. No evidence of mitral stenosis.  4. The aortic valve has an indeterminant number of cusps. There is moderate thickening of the aortic valve. Aortic valve regurgitation is not visualized. Aortic valve sclerosis is present, with no evidence of aortic valve stenosis.  5. The inferior vena cava is normal in size with greater than 50% respiratory variability, suggesting right atrial pressure of 3 mmHg. FINDINGS  Left Ventricle: Left ventricular  ejection fraction, by estimation, is 50 to 55%. The left ventricle has low normal function. The left ventricle demonstrates regional wall motion abnormalities. The left ventricular internal cavity size was normal in size. There

## 2021-09-26 ENCOUNTER — Encounter: Payer: Self-pay | Admitting: Cardiovascular Disease

## 2021-09-26 DIAGNOSIS — E785 Hyperlipidemia, unspecified: Secondary | ICD-10-CM

## 2021-09-26 DIAGNOSIS — Z720 Tobacco use: Secondary | ICD-10-CM

## 2021-09-26 DIAGNOSIS — I200 Unstable angina: Secondary | ICD-10-CM

## 2021-09-26 MED ORDER — METOPROLOL TARTRATE 25 MG PO TABS: 25.0000 mg | ORAL_TABLET | Freq: Two times a day (BID) | ORAL | 3 refills | Status: DC

## 2021-09-26 MED ORDER — NITROGLYCERIN 0.4 MG SL SUBL: 0.4000 mg | SUBLINGUAL_TABLET | SUBLINGUAL | 3 refills | Status: DC | PRN

## 2021-09-26 MED ORDER — ASPIRIN 81 MG PO CHEW: 81.0000 mg | CHEWABLE_TABLET | Freq: Every day | ORAL | Status: DC

## 2021-09-26 MED ORDER — TICAGRELOR 90 MG PO TABS: 90.0000 mg | ORAL_TABLET | Freq: Two times a day (BID) | ORAL | 6 refills | Status: DC

## 2021-09-26 MED ORDER — EZETIMIBE 10 MG PO TABS
10.0000 mg | ORAL_TABLET | Freq: Every day | ORAL | Status: DC
Start: 2021-09-26 — End: 2021-09-26
  Administered 2021-09-26: 10 mg via ORAL
  Filled 2021-09-26: qty 1

## 2021-09-26 MED ORDER — METOPROLOL TARTRATE 25 MG PO TABS
25.0000 mg | ORAL_TABLET | Freq: Two times a day (BID) | ORAL | Status: DC
  Administered 2021-09-26: 25 mg via ORAL
  Filled 2021-09-26: qty 1

## 2021-09-26 NOTE — Progress Notes (Signed)
Mobility Specialist - Progress Note ? ? ? 09/26/21 1200  ?Mobility  ?Activity Ambulated independently in hallway  ?Level of Assistance Independent  ?Assistive Device None  ?Distance Ambulated (ft) 320 ft  ?Activity Response Tolerated well  ?$Mobility charge 1 Mobility  ? ? ?Pt ambulates in hallway using RA voicing no complaints. Pt returns to room with needs in reach. ? ?Clarisa Schools ?Mobility Specialist ?09/26/21, 12:40 PM ? ? ? ?

## 2021-09-26 NOTE — TOC Initial Note (Signed)
Transition of Care (TOC) - Initial/Assessment Note  ? ? ?Patient Details  ?Name: Jeffery Sweeney ?MRN: [id number] ?Date of Birth: 11-02-1960 ? ?Transition of Care (TOC) CM/SW Contact:    ?Laurena Slimmer, RN ?Phone Number: ?09/26/2021, 1:36 PM ? ?Clinical Narrative:                 ? ?Transition of Care (TOC) Screening Note ? ? ?Patient Details  ?Name: Jeffery Sweeney ?Date of Birth: 1960-09-19 ? ? ?Transition of Care (TOC) CM/SW Contact:    ?Laurena Slimmer, RN ?Phone Number: ?09/26/2021, 1:36 PM ? ? ? ?Transition of Care Department [facility]) has reviewed patient and no TOC needs have been identified at this time. We will continue to monitor patient advancement through interdisciplinary progression rounds. If new patient transition needs arise, please place a TOC consult. ? ? ? ?  ?  ? ? ?Patient Goals and CMS Choice ?  ?  ?  ? ?Expected Discharge Plan and Services ?  ?  ?  ?  ?  ?Expected Discharge Date: 09/26/21               ?  ?  ?  ?  ?  ?  ?  ?  ?  ?  ? ?Prior Living Arrangements/Services ?  ?  ?  ?       ?  ?  ?  ?  ? ?Activities of Daily Living ?Home Assistive Devices/Equipment: None ?ADL Screening (condition at time of admission) ?Patient's cognitive ability adequate to safely complete daily activities?: Yes ?Is the patient deaf or have difficulty hearing?: No ?Does the patient have difficulty seeing, even when wearing glasses/contacts?: No ?Does the patient have difficulty concentrating, remembering, or making decisions?: No ?Patient able to express need for assistance with ADLs?: Yes ?Does the patient have difficulty dressing or bathing?: No ?Independently performs ADLs?: Yes (appropriate for developmental age) ?Does the patient have difficulty walking or climbing stairs?: No ?Weakness of Legs: None ?Weakness of Arms/Hands: None ? ?Permission Sought/Granted ?  ?  ?   ?   ?   ?   ? ?Emotional Assessment ?  ?  ?  ?  ?  ?  ? ?Admission diagnosis:  Unstable angina ([place]) [I20.0] ?Patient Active Problem List  ?  Diagnosis Date Noted  ? Hyperlipidemia LDL goal <70 09/26/2021  ? 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 Vertigo   ? 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  ? Chest pain 07/06/2020  ? CAD, multiple vessel 07/06/2020  ? 3-vessel CAD 07/06/2020  ? Hypertension   ? Tobacco abuse   ? Non-ST elevation (NSTEMI) myocardial infarction [facility])   ? Unstable angina ([place]) 07/05/2020  ? ?PCP:  Wellington Hampshire, MD ?Pharmacy:   ?[facility] [place] [facility] [place] ?[address] ([place]) [place] [address] ?Phone: [phone] Fax: [phone] ? ?[place] ?[address] ?[place] [address] ?Phone: [phone] Fax: [phone] ? ? ? ? ?Social Determinants of Health (SDOH) Interventions ?  ? ?Readmission Risk Interventions ?   ? View : No data to display.  ?  ?  ?  ? ? ? ?

## 2021-09-26 NOTE — Plan of Care (Signed)
?  Problem: Education: ?Goal: Knowledge of General Education information will improve ?Description: Including pain rating scale, medication(s)/side effects and non-pharmacologic comfort measures ?Outcome: Progressing ?  ?Problem: Activity: ?Goal: Risk for activity intolerance will decrease ?Outcome: Progressing ?  ?Problem: Nutrition: ?Goal: Adequate nutrition will be maintained ?Outcome: Progressing ?  ?Problem: Safety: ?Goal: Ability to remain free from injury will improve ?Outcome: Progressing ?  ?Problem: Pain Managment: ?Goal: General experience of comfort will improve ?Outcome: Progressing ?  ?Problem: Cardiovascular: ?Goal: Ability to achieve and maintain adequate cardiovascular perfusion will improve ?Outcome: Progressing ?Goal: Vascular access site(s) Level 0-1 will be maintained ?Outcome: Progressing ?  ?

## 2021-09-26 NOTE — Discharge Summary (Signed)
?Discharge Summary  ?  ?Patient ID: Jeffery Fischerobert E Sweeney ?MRN: [id number]; DOB: [birth year] ? ?Admit date: 09/23/2021 ?Discharge date: 09/26/2021 ? ?PCP:  Iran OuchArida, Muhammad A, MD ?  ?[facility] Providers ?Cardiologist:  None  ?Cardiology APP:  Fransico MichaelFurth, Cadence H, PA-C     ? ? ?Discharge Diagnoses  ?  ?Principal Problem: ?  Unstable angina (HCC) ?S/p PCI with DES  ?Active Problems: ?  CAD, multiple vessel ?  Hypertension ?  Tobacco abuse ?  Stroke (cerebrum) (HCC) ?  Hyperlipidemia LDL goal <70 ? ? ? ?Diagnostic Studies/Procedures  ?  ?Left Heart Cath and CORS/GRAFTS Angiography and Coronary Artery Intervention 09/23/2021 at 11:33AM ?Left Main  ?Vessel is angiographically normal.  ?  ?Left Anterior Descending  ?Prox LAD to Mid LAD lesion is 80% stenosed. The lesion is type C.  ?  ?First Diagonal Branch  ?Vessel is large in size.  ?1st Diag lesion is 90% stenosed.  ?  ?Lateral First Diagonal Branch  ?Lat 1st Diag lesion is 90% stenosed.  ?  ?Second Diagonal Branch  ?2nd Diag lesion is 80% stenosed.  ?  ?Left Circumflex  ?Vessel is small.  ?Mid Cx to Dist Cx lesion is 80% stenosed.  ?  ?Second Obtuse Marginal Branch  ?2nd Mrg lesion is 80% stenosed.  ?  ?Third Obtuse Marginal Branch  ?Vessel is small in size.  ?  ?Right Coronary Artery  ?Prox RCA to Mid RCA lesion is 90% stenosed.  ?Dist RCA lesion is 30% stenosed.  ?  ?Right Posterior Descending Artery  ?RPDA lesion is 90% stenosed.  ?  ?Sequential Graft To Prox RCA, RPDA  ?Origin to Mid Graft lesion before Prox RCA is 30% stenosed.  ?  ?Saphenous Graft To 1st Diag  ?SVG. The graft exhibits mild .  ?  ?LIMA Graft To Dist LAD  ?Origin to Prox Graft lesion is 80% stenosed. The lesion is type C. ?       *s/p PCI with a Stent Onyx Frontier 3.5 x 18 mm  ?  ?  ?Left Heart Cath and Coronary Angiography done on 09/23/2021 at 19:21  ?1.  Acute closure of LIMA to LAD beyond the previously stented segment due to what seems to be spiral dissection throughout the whole graft to the  anastomosis.  Native vessel angiography showed that the LAD did not supply apical flow in spite of an occluded LIMA. ?2.  Successful aspiration thrombectomy, balloon angioplasty and 7 overlapped drug-eluting stents to cover the whole length of the LIMA to beyond the anastomosis and establish TIMI-3 flow. ? * Onyx Frontier 3.5 x 26 mm ?* Onyx Frontier 3.0 x 38 mm ? * Onyx Frontier 3.0 x 38 mm ? * Onyx Frontier 2.75 x 38 mm ? * Onyx Frontier 2.5 x 26 mm ? * Onyx Frontier 2.25 x 18 mm ? * Onyx Frontier 2.25 x 12 mm ? ?_____________ ?  ?History of Present Illness   ?  ?Jeffery Sweeney is a 61 y.o. male with a history of self reported MI x 3 with subsequesnt PCI and CABG x 3, essential HTN, h/o tonacco use, HLD, h/o stroke 06/2021, who was admitted after outpatietn LHC for unstable angina with stenosis at the ostium of the LIMA that was complicated by dissection requiring repeat LHC due to abrupt closure of the LIMA-LAD. He is now s/p 8 stents to the LIMA-LAD. ? ?Hospital Course  ?   ?Consultants: None ? ?[facility] for  a left heart catheterization with possible PCI after having recurrent unstable angina. He was known to have significant underlying three-vessel coronary artery disease with patent grafts including SVG to the right PDA, SVG to the diagonal and LIMA to LAD.  He underwent successful DES placement and aspiration thrombectomy of the LIMA to LAD with restoration of TIMI-3 flow and a possible dissection which was non-flow limiting. He did have residual thrombus and was continued on Aggrastat infusion. He was than taken to the ICU for overnight observation. Several hours later around dinner he started with complaints of 10/10 chest pain/ chest heaviness and had an EKG preformed which showed ST elevations in leads V3-V5. He was subsequently taken back to cardiac cath lab for re-evaluation of the previous stent that was placed and considering the high clot burden with residual clot  after his procedure the presentation was worrisome for repeat obstruction. During his second LHC of the day he received 7 overlapping stents that covered the length of the LIMA. See the cath report for all details of the location and length of the DES. After his second procedure he returned to the ICU continuing Aggrastat for another 12 hours as recommended. He had several medication changes that were made and these were explained to him in detail. He did have some acute shortness of breath the day after his procedure and was placed on 20 mg IV Lasix and his metoprolol was increased to 50 mg daily. Since that time the Lasix has been stopped and his metoprolol has to be reduced related to softer blood pressures. He had a repeat echocardiogram completed on 09/24/2021 which revealed LVEF 50-55%. He was kept in the ICU and moved to telemetry on 09/25/2021. Once on telemetry he was able to move around in his room and encouraged to walk to the halls to ensure he had no other episodes of chest pain or chest heaviness. He will be continued to DAPT with ASA and ticagrelor indefinitely, high intensity statin therapy as well as Repatha to lower LDL to goal fo <70 but ideally <55. He will continue with close outpatient follow-up. He is being discharged with new prescriptions, being referred to cardiac rehab, follow-up appointment, explanation of his discharge instructions, and all questions have been answered. ? ?Physical Exam ?HENT:  ?   Head: Normocephalic.  ?Eyes:  ?   Comments: Glasses on  ?Cardiovascular:  ?   Rate and Rhythm: Normal rate and regular rhythm.  ?   Pulses:     ?     Radial pulses are 2+ on the right side.  ?     Femoral pulses are 2+ on the right side. ?     Dorsalis pedis pulses are 2+ on the right side.  ?     Posterior tibial pulses are 2+ on the right side.  ?   Heart sounds: Normal heart sounds.  ?   Comments: No murmur, rubs, or gallops ?Pulmonary:  ?   Breath sounds: Normal breath sounds.  ?Abdominal:  ?    General: Bowel sounds are normal.  ?   Palpations: Abdomen is soft.  ?Musculoskeletal:     ?   General: Normal range of motion.  ?   Cervical back: Neck supple.  ?Skin: ?   General: Skin is warm and dry.  ?   Comments: Right groin cath site soft without bleeding or hematoma ?Left wrist cath site clean and dry without bleeding and hematoma  ?Neurological:  ?   General: No  focal deficit present.  ?   Mental Status: He is alert.  ? ? ? ?Did the patient have an acute coronary syndrome (MI, NSTEMI, STEMI, etc) this admission?:  Yes                              ? ?AHA/ACC Clinical Performance & Quality Measures: ?Aspirin prescribed? - Yes ?ADP Receptor Inhibitor (Plavix/Clopidogrel, Brilinta/Ticagrelor or Effient/Prasugrel) prescribed (includes medically managed patients)? - Yes ?Beta Blocker prescribed? - Yes ?High Intensity Statin (Lipitor 40-80mg  or Crestor 20-40mg ) prescribed? - Yes ?EF assessed during THIS hospitalization? - Yes ?For EF <40%, was ACEI/ARB prescribed? - Not Applicable (EF >/= 40%) ?For EF <40%, Aldosterone Antagonist (Spironolactone or Eplerenone) prescribed? - Not Applicable (EF >/= 40%) ?Cardiac Rehab Phase II ordered (including medically managed patients)? - Yes  ? ?   ? ?  ?_____________ ? ?Discharge Vitals ?Blood pressure 121/90, pulse 83, temperature 97.8 ?F (36.6 ?C), temperature source Oral, resp. rate 18, height 5\' 3"  (1.6 m), weight 83.5 kg, SpO2 97 %.  ?Filed Weights  ? 09/23/21 0945 09/25/21 1755 09/26/21 0452  ?Weight: 87 kg 87 kg 83.5 kg  ? ? ?Labs & Radiologic Studies  ?  ?CBC ?Recent Labs  ?  09/24/21 ?0310 09/25/21 ?0809  ?WBC 8.4 8.4  ?HGB 12.7* 14.4  ?HCT 38.2* 43.0  ?MCV 87.2 85.5  ?PLT 205 176  ? ?Basic Metabolic Panel ?Recent Labs  ?  09/24/21 ?0310 09/25/21 ?0809  ?NA 135 137  ?K 3.7 3.7  ?CL 108 106  ?CO2 22 26  ?GLUCOSE 112* 107*  ?BUN 15 15  ?CREATININE 0.89 0.88  ?CALCIUM 8.3* 8.8*  ? ?Liver Function Tests ?No results for input(s): AST, ALT, ALKPHOS, BILITOT, PROT, ALBUMIN  in the last 72 hours. ?No results for input(s): LIPASE, AMYLASE in the last 72 hours. ?High Sensitivity Troponin:   ?Recent Labs  ?Lab 09/23/21 ?2220 09/24/21 ?0018  ?TROPONINIHS 11/24/21* 2,263*  ?  ?BNP ?[place]

## 2021-09-26 NOTE — Discharge Instructions (Signed)
**  PLEASE REMEMBER TO BRING ALL OF YOUR MEDICATIONS TO EACH OF YOUR FOLLOW-UP OFFICE VISITS. ° °NO HEAVY LIFTING OR SEXUAL ACTIVITY X 7 DAYS. °NO DRIVING X 3-5 DAYS. °NO SOAKING BATHS, HOT TUBS, POOLS, ETC., X 7 DAYS. ° °Groin Site Care °Refer to this sheet in the next few weeks. These instructions provide you with information on caring for yourself after your procedure. Your caregiver may also give you more specific instructions. Your treatment has been planned according to current medical practices, but problems sometimes occur. Call your caregiver if you have any problems or questions after your procedure. °HOME CARE INSTRUCTIONS °· You may shower 24 hours after the procedure. Remove the bandage (dressing) and gently wash the site with plain soap and water. Gently pat the site dry.  °· Do not apply powder or lotion to the site.  °· Do not sit in a bathtub, swimming pool, or whirlpool for 5 to 7 days.  °· No bending, squatting, or lifting anything over 10 pounds (4.5 kg) as directed by your caregiver.  °· Inspect the site at least twice daily.  °· Do not drive home if you are discharged the same day of the procedure. Have someone else drive you.  ° °What to expect: °· Any bruising will usually fade within 1 to 2 weeks.  °· Blood that collects in the tissue (hematoma) may be painful to the touch. It should usually decrease in size and tenderness within 1 to 2 weeks.  °SEEK IMMEDIATE MEDICAL CARE IF: °· You have unusual pain at the groin site or down the affected leg.  °· You have redness, warmth, swelling, or pain at the groin site.  °· You have drainage (other than a small amount of blood on the dressing).  °· You have chills.  °· You have a fever or persistent symptoms for more than 72 hours.  °· You have a fever and your symptoms suddenly get worse.  °· Your leg becomes pale, cool, tingly, or numb.  °You have heavy bleeding from the site. Hold pressure on the site. . ° °

## 2021-10-02 ENCOUNTER — Telehealth: Payer: Self-pay | Admitting: Cardiovascular Disease

## 2021-10-02 ENCOUNTER — Ambulatory Visit
Admission: RE | Admit: 2021-10-02 | Discharge: 2021-10-02 | Disposition: A | Discharge status: Home / Self Care | Payer: Self-pay | Source: Ambulatory Visit | Attending: Medical | Admitting: Medical

## 2021-10-02 DIAGNOSIS — I200 Unstable angina: Secondary | ICD-10-CM | POA: Insufficient documentation

## 2021-10-02 DIAGNOSIS — I10 Essential (primary) hypertension: Secondary | ICD-10-CM | POA: Insufficient documentation

## 2021-10-02 DIAGNOSIS — E785 Hyperlipidemia, unspecified: Secondary | ICD-10-CM | POA: Insufficient documentation

## 2021-10-02 DIAGNOSIS — Z720 Tobacco use: Secondary | ICD-10-CM | POA: Insufficient documentation

## 2021-10-02 DIAGNOSIS — I252 Old myocardial infarction: Secondary | ICD-10-CM | POA: Insufficient documentation

## 2021-10-02 DIAGNOSIS — R079 Chest pain, unspecified: Secondary | ICD-10-CM | POA: Insufficient documentation

## 2021-10-02 DIAGNOSIS — Z951 Presence of aortocoronary bypass graft: Secondary | ICD-10-CM | POA: Insufficient documentation

## 2021-10-02 LAB — ECHOCARDIOGRAM COMPLETE
AR max vel: 1.84 cm2
AV Area VTI: 2.02 cm2
AV Area mean vel: 1.77 cm2
AV Mean grad: 2.3 mmHg
AV Peak grad: 3.4 mmHg
Ao pk vel: 0.93 m/s
Area-P 1/2: 2.85 cm2
MV VTI: 1.86 cm2
S' Lateral: 2.2 cm

## 2021-10-02 NOTE — Telephone Encounter (Signed)
Patient would like letter stating weight restrictions - how much can he lift at work ?Also stating when he can go back to work ?Please call when ready  ?

## 2021-10-02 NOTE — Progress Notes (Signed)
*  PRELIMINARY RESULTS* ?Echocardiogram ?2D Echocardiogram has been performed. ? ?[name], [name] ?10/02/2021, 10:29 AM ?

## 2021-10-03 NOTE — Telephone Encounter (Signed)
Will fwd rqst to Cadence Living[place], [name]

## 2021-10-04 NOTE — Telephone Encounter (Signed)
-----   Message from Cadence David Stall, PA-C sent at 10/03/2021  9:47 AM EDT ----- Echo showed normal pump function, mildly thickened heart, impaired relaxation. Overall looks good.

## 2021-10-04 NOTE — Telephone Encounter (Signed)
Furth, Cadence H, PA-C  You Yesterday (7:31 AM)   What is his job?  He can likely go back after 2 weeks. He shouldn't lift anything over 25lbs for 2-3 weeks after that.    Pt sts that he works at [employer]. 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 Made the patient aware of Cadences recommendation above. Pts cath w/pci was on 09/23/21. That would give him a return to work date of 10/07/21 with restrictions of no lifting >25lbs until  10/28/21. Pt sts that he would like to pick up the letter on Monday 10/07/21. Adv the patient that it will be left at the front desk to be picked up.

## 2021-10-09 ENCOUNTER — Telehealth: Payer: Self-pay | Admitting: Medical

## 2021-10-09 MED ORDER — PANTOPRAZOLE SODIUM 40 MG PO TBEC: 40.0000 mg | DELAYED_RELEASE_TABLET | Freq: Every day | ORAL | 1 refills | Status: DC

## 2021-10-09 MED ORDER — ROSUVASTATIN CALCIUM 40 MG PO TABS: 40.0000 mg | ORAL_TABLET | Freq: Every day | ORAL | 1 refills | Status: DC

## 2021-10-09 NOTE — Telephone Encounter (Signed)
*  STAT* If patient is at the pharmacy, call can be transferred to refill team.   1. Which medications need to be refilled? (please list name of each medication and dose if known)  pantoprazole (PROTONIX) 40 MG tablet  rosuvastatin (CRESTOR) 40 MG tablet   2. Which pharmacy/location (including street and city if local pharmacy) is medication to be sent to? [facility] - [place] - [address]  3. Do they need a 30 day or 90 day supply? 30 with refills

## 2021-10-09 NOTE — Progress Notes (Signed)
Cardiology Office Note:    Date:  10/11/2021   ID:  Jeffery Fischerobert E Blick, DOB Jun 04, 1960, MRN [id number]  PCP:  Iran OuchArida, Muhammad A, MD  [facility] Cardiologist:  None  [facility] Electrophysiologist:  None   Referring MD: Iran OuchArida, Muhammad A, MD   Chief Complaint: Hospital follow-up   History of Present Illness:    Jeffery Sweeney is a 61 y.o. male with a hx of hx of  self reported MI x3 (5 years prior) with subsequent PCI and CABGx3, HTN, h/o tobacco use, HLD, h/o stroke 06/2021 who is being seen for hospital follow-up.    Seen 07/14/20 for chest pain similar to prior MI. He underwent LHC that showed 3V CAD, LAD disease moderate with significant FFR/IFR ratio 0.74. Also noted severe stenosis in the bifurcating first diagnoal, second diag, and RCA. Lcx also with disease. Echo showed EF 55-60%, NR WMA, mild LVH. [facility]. He underwent CABG x3 (LIMA to LAD, rSVG-PDA, SVG-ramus) 2/21. ABIs normal bilaterally.    Seen in the ED 10/10/20 for chest pain, felt to be atypical. Troponin negative x2 and EKG without changes. CXR and labs unremarkable. Cardiologist was consulted, low suspicion for ACS, and he was discharged home. At follow-up in the office a Myoview Lexiscan was ordered. Myoview showed no evidence of ischemia with normal  EF.    Admitted 06/2021 for TIA. Echo showed LVEF 60-65%, no WMA, G1DD,  mild MR.  Seen in the office 09/20/21 for chest pain and he was set up for a cardiac cath. LHC showed underlying 2V CAD with significant disease in the circumflex that was not revascularized. He also had an 80% ostial stenosis of the LIMA to LAD. There was loss of flow in the LIMA after diagnostic angiography thought to be due to disruption of plaque or vessel closure. He underwent successful DES placement and aspiration thrombectomy of the LIMA to LAD with restoration of TIMI-3 flow. He did have residual thrombus and was started on Aggrastat.  There was the appearance  of what seems to be dissection in the LIMA but it was nonflow limiting and medical therapy was recommended.  The patient was chest pain-free and continued to do well for about 6 hours but around 6 PM, he started having recurrent chest pain with elevated blood pressure, repeat EKG showed few millimeters of anterolateral ST elevation. He was taken back to the cath lab for relook angiography and possible PCI. Emergent cardiac catheterization was done via the right femoral artery which confirmed this.  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  The patient left the Cath Lab chest pain-free.  He was switched from clopidogrel to ticagrelor which should be continued indefinitely if possible. Echo showed LVEF 50-55%, mild LVH, trivial MR. He was discharged in stable condition.   Today, the patient is doing well. HE has a from to fill out for Repatha. Cath site, left raidal and right groin are stable. He denies chest pain or SOB. He is back at work with restrictions. BP is mildly elevated today, although he only recently took his Toprol. No LLE, orthopnea, pnd. He denies bleeding with ASA with Brilinta. Will refer to cardiac rehab.   Past Medical History:  Diagnosis Date   CVA (cerebral vascular accident) (HCC) 06/2021   Small acute/subacute infarcts in the left occipital lobe and   Hyperlipidemia LDL goal <70  started on Repatha 09/26/2021, continue Crestor 40mg  daily   Hypertension    Multiple vessel coronary artery disease    Myocardial infarct (HCC)    x 3; PCI with single stent placement for each (in )   NSTEMI (non-ST elevated myocardial infarction) (HCC)    LHC 2/18 with multivessel CAD and transfer to [facility] for CABG eval   STEMI (ST elevation myocardial infarction) (HCC) 09/23/2021   LHC 09/23/2021 x 2   Tobacco use    2 cigarettes daily   Unstable angina (HCC)    LHC 09/23/2021  x 2; total 8 DES to LIMA    Past Surgical History:  Procedure Laterality Date   CORONARY ANGIOPLASTY WITH STENT PLACEMENT     in 11/23/2021 in 2018-19   CORONARY ARTERY BYPASS GRAFT N/A 07/09/2020   Procedure: CORONARY ARTERY BYPASS GRAFTING (CABG) x THREE , USING LEFT INTERNAL MAMMARY ARTERY, AND RIGHT LEG GREATER SAPHENOUS VEIN HARVESTED ENDOSCOPICALLY;  Surgeon: 07/11/2020, MD;  Location: [facility] OR;  Service: Open Heart Surgery;  Laterality: N/A;   CORONARY STENT INTERVENTION N/A 09/23/2021   Procedure: CORONARY STENT INTERVENTION;  Surgeon: 11/23/2021, MD;  Location: [facility] INVASIVE CV LAB;  Service: Cardiovascular;  Laterality: N/A;   CORONARY/GRAFT ACUTE MI REVASCULARIZATION N/A 09/23/2021   Procedure: Coronary/Graft Acute MI Revascularization;  Surgeon: 11/23/2021, MD;  Location: [facility] INVASIVE CV LAB;  Service: Cardiovascular;  Laterality: N/A;   INTRAVASCULAR PRESSURE WIRE/FFR STUDY N/A 07/06/2020   Procedure: INTRAVASCULAR PRESSURE WIRE/FFR STUDY;  Surgeon: 07/08/2020, MD;  Location: [facility] INVASIVE CV LAB;  Service: Cardiovascular;  Laterality: N/A;  LAD   LEFT HEART CATH AND CORONARY ANGIOGRAPHY N/A 07/06/2020   Procedure: LEFT HEART CATH AND CORONARY ANGIOGRAPHY;  Surgeon: 07/08/2020, MD;  Location: [facility] INVASIVE CV LAB;  Service: Cardiovascular;  Laterality: N/A;   LEFT HEART CATH AND CORONARY ANGIOGRAPHY N/A 09/23/2021   Procedure: LEFT HEART CATH AND CORONARY ANGIOGRAPHY;  Surgeon: 11/23/2021, MD;  Location: [facility] INVASIVE CV LAB;  Service: Cardiovascular;  Laterality: N/A;   LEFT HEART CATH AND CORS/GRAFTS ANGIOGRAPHY N/A 09/23/2021   Procedure: LEFT HEART CATH AND CORS/GRAFTS ANGIOGRAPHY;  Surgeon: 11/23/2021, MD;  Location: [facility] INVASIVE CV LAB;  Service: Cardiovascular;  Laterality: N/A;   TEE WITHOUT CARDIOVERSION N/A 07/09/2020   Procedure: TRANSESOPHAGEAL ECHOCARDIOGRAM (TEE);  Surgeon: 07/11/2020, MD;  [facility] OR;  Service: Open  Heart Surgery;  Laterality: N/A;   WRIST SURGERY Left    for cyst    Current Medications: Current Meds  Medication Sig   aspirin 81 MG chewable tablet Chew 1 tablet (81 mg total) by mouth daily.   Evolocumab (REPATHA SURECLICK) 140 MG/ML SOAJ Inject 140 mg into the skin every 14 (fourteen) days.   meclizine (ANTIVERT) 25 MG tablet Take 1 tablet (25 mg total) by mouth 3 (three) times daily as needed for dizziness.   metoprolol tartrate (LOPRESSOR) 50 MG tablet Take 1 tablet (50 mg total) by mouth 2 (two) times daily.   nitroGLYCERIN (NITROSTAT) 0.4 MG SL tablet Place 1 tablet (0.4 mg total) under the tongue every 5 (five) minutes as needed for chest pain.   nitroGLYCERIN (NITROSTAT) 0.4 MG SL tablet Place 1 tablet (0.4 mg total) under the tongue every 5 (five) minutes as needed for chest pain.   pantoprazole (PROTONIX) 40 MG tablet Take 1 tablet (40 mg total) by mouth once daily.   rosuvastatin (CRESTOR) 40 MG tablet Take 1 tablet (40 mg  total) by mouth once daily.   ticagrelor (BRILINTA) 90 MG TABS tablet Take 1 tablet (90 mg total) by mouth 2 (two) times daily.   [DISCONTINUED] metoprolol tartrate (LOPRESSOR) 25 MG tablet Take 1 tablet (25 mg total) by mouth 2 (two) times daily.     Allergies:   Patient has no known allergies.   Social History   Socioeconomic History   Marital status: Divorced    Spouse name: Not on file   Number of children: Not on file   Years of education: Not on file   Highest education level: Not on file  Occupational History   Not on file  Tobacco Use   Smoking status: Former    Packs/day: 0.50    Types: Cigarettes    Quit date: 07/06/2020    Years since quitting: 1.2   Smokeless tobacco: Never  Vaping Use   Vaping Use: Never used  Substance and Sexual Activity   Alcohol use: Not Currently   Drug use: Not Currently   Sexual activity: Not on file  Other Topics Concern   Not on file  Social History Narrative   Not on file   Social Determinants of  Health   Financial Resource Strain: Not on file  Food Insecurity: Not on file  Transportation Needs: Not on file  Physical Activity: Not on file  Stress: Not on file  Social Connections: Not on file     Family History: The patient's family history is not on file.  ROS:   Please see the history of present illness.     All other systems reviewed and are negative.  EKGs/Labs/Other Studies Reviewed:    The following studies were reviewed today:  Left Heart Cath and CORS/GRAFTS Angiography and Coronary Artery Intervention 09/23/2021 at 11:33AM Left Main  Vessel is angiographically normal.    Left Anterior Descending  Prox LAD to Mid LAD lesion is 80% stenosed. The lesion is type C.    First Diagonal Branch  Vessel is large in size.  1st Diag lesion is 90% stenosed.    Lateral First Diagonal Branch  Lat 1st Diag lesion is 90% stenosed.    Second Diagonal Branch  2nd Diag lesion is 80% stenosed.    Left Circumflex  Vessel is small.  Mid Cx to Dist Cx lesion is 80% stenosed.    Second Obtuse Marginal Branch  2nd Mrg lesion is 80% stenosed.    Third Obtuse Marginal Branch  Vessel is small in size.    Right Coronary Artery  Prox RCA to Mid RCA lesion is 90% stenosed.  Dist RCA lesion is 30% stenosed.    Right Posterior Descending Artery  RPDA lesion is 90% stenosed.    Sequential Graft To Prox RCA, RPDA  Origin to Mid Graft lesion before Prox RCA is 30% stenosed.    Saphenous Graft To 1st Diag  SVG. The graft exhibits mild .    LIMA Graft To Dist LAD  Origin to Prox Graft lesion is 80% stenosed. The lesion is type C.        *s/p PCI with a Stent Onyx Frontier 3.5 x 18 mm      Left Heart Cath and Coronary Angiography done on 09/23/2021 at 19:21  1.  Acute closure of LIMA to LAD beyond the previously stented segment due to what seems to be spiral dissection throughout the whole graft to the anastomosis.  Native vessel angiography showed that the LAD did not supply  apical flow in spite of  an occluded LIMA. 2.  Successful aspiration thrombectomy, balloon angioplasty and 7 overlapped drug-eluting stents to cover the whole length of the LIMA to beyond the anastomosis and establish TIMI-3 flow.             * Onyx Frontier 3.5 x 26 mm * Onyx Frontier 3.0 x 38 mm             * Onyx Frontier 3.0 x 38 mm             * Onyx Frontier 2.75 x 38 mm             * Onyx Frontier 2.5 x 26 mm             * Onyx Frontier 2.25 x 18 mm             * Onyx Frontier 2.25 x 12 mm     EKG:  EKG is  ordered today.  The ekg ordered today demonstrates NSR, 72bpm, TWI anterolateral waves  Recent Labs: 11/22/2020: Magnesium 1.9; TSH 2.229 02/05/2021: ALT 17 09/25/2021: BUN 15; Creatinine, Ser 0.88; Hemoglobin 14.4; Platelets 176; Potassium 3.7; Sodium 137  Recent Lipid Panel    Component Value Date/Time   CHOL 151 07/06/2021 0635   TRIG 194 (H) 07/06/2021 0635   HDL 32 (L) 07/06/2021 0635   CHOLHDL 4.7 07/06/2021 0635   VLDL 39 07/06/2021 0635   LDLCALC 80 07/06/2021 0635     Physical Exam:    VS:  BP (!) 144/90 (BP Location: Left Arm, Patient Position: Sitting)   Pulse 74   Ht  (1.6 m)   Wt 191 lb 6.4 oz (86.8 kg)   SpO2 95%   BMI 33.90 kg/m     Wt Readings from Last 3 Encounters:  10/11/21 191 lb 6.4 oz (86.8 kg)  09/26/21 184 lb 1.6 oz (83.5 kg)  09/20/21 192 lb (87.1 kg)     GEN:  Well nourished, well developed in no acute distress HEENT: Normal NECK: No JVD; No carotid bruits LYMPHATICS: No lymphadenopathy CARDIAC: RRR, no murmurs, rubs, gallops RESPIRATORY:  Clear to auscultation without rales, wheezing or rhonchi  ABDOMEN: Soft, non-tender, non-distended MUSCULOSKELETAL:  No edema; No deformity  SKIN: Warm and dry NEUROLOGIC:  Alert and oriented x 3 PSYCHIATRIC:  Normal affect   ASSESSMENT:    1. Coronary artery disease involving native coronary artery of native heart without angina pectoris   2. Chest tightness   3. Essential  hypertension   4. Hyperlipidemia, mixed   5. TIA (transient ischemic attack)   6. S/P CABG x 3    PLAN:    In order of problems listed above:  CAD s/p CABG and recent stenting/Acute STEMI Recent Post left heart cath admission. 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 Plavix was changed to Brilinta. Echo showed normal LVEF. Patient is overall stable with no chest pain or SOB. He is back at work with restrictions. Cath sites stable. EKG with residual changes. I will refer to cardiac rehab. He denies bleeding with DAPT. Continue Aspirin and Brilinta. Continue Lopressor, Repatha and statin therapy.   HTN BP mildly elevated. He does not want to start another mediation today. Increase Lopressor to  BID. He will monitor BP at home.  HLD LDL 80. He is on Repatha, we  will complete paperwork today. Continue Crestor  daily.   H/o stroke 06/2021 Plavix changed to Brilinta as above. Continue aspirin and cholesterol therapy.   Disposition: Follow up in 2 month(s) with MD/APP     Signed, Leanette Eutsler David Stall, PA-C  10/11/2021 1:12 PM    [facility]

## 2021-10-09 NOTE — Telephone Encounter (Signed)
Requested Prescriptions   Signed Prescriptions Disp Refills   pantoprazole (PROTONIX) 40 MG tablet 30 tablet 1    Sig: Take 1 tablet (40 mg total) by mouth once daily.    Authorizing Provider: Marianne Sofia    Ordering User: Thayer Headings, Kahliya Fraleigh L   rosuvastatin (CRESTOR) 40 MG tablet 30 tablet 1    Sig: Take 1 tablet (40 mg total) by mouth once daily.    Authorizing Provider: Marianne Sofia    Ordering User: Thayer Headings, Bran Aldridge L

## 2021-10-11 ENCOUNTER — Ambulatory Visit (INDEPENDENT_AMBULATORY_CARE_PROVIDER_SITE_OTHER): Payer: Self-pay | Admitting: Medical

## 2021-10-11 ENCOUNTER — Encounter: Payer: Self-pay | Admitting: Medical

## 2021-10-11 VITALS — BP 144/90 | HR 74 | Ht 63.0 in | Wt 191.4 lb

## 2021-10-11 DIAGNOSIS — G459 Transient cerebral ischemic attack, unspecified: Secondary | ICD-10-CM

## 2021-10-11 DIAGNOSIS — E782 Mixed hyperlipidemia: Secondary | ICD-10-CM

## 2021-10-11 DIAGNOSIS — I10 Essential (primary) hypertension: Secondary | ICD-10-CM

## 2021-10-11 DIAGNOSIS — Z951 Presence of aortocoronary bypass graft: Secondary | ICD-10-CM

## 2021-10-11 DIAGNOSIS — I2510 Atherosclerotic heart disease of native coronary artery without angina pectoris: Secondary | ICD-10-CM

## 2021-10-11 DIAGNOSIS — R0789 Other chest pain: Secondary | ICD-10-CM

## 2021-10-11 MED ORDER — METOPROLOL TARTRATE 50 MG PO TABS: 50.0000 mg | ORAL_TABLET | Freq: Two times a day (BID) | ORAL | 3 refills | Status: DC

## 2021-10-11 NOTE — Patient Instructions (Signed)
Medication Instructions:  Your physician has recommended you make the following change in your medication:  1) INCREASE Lopressor (metoprolol tartrate) 50 mg twice daily  *If you need a refill on your cardiac medications before your next appointment, please call your pharmacy*  Follow-Up: At [facility], you and your health needs are our priority.  As part of our continuing mission to provide you with exceptional heart care, we have created designated Provider Care Teams.  These Care Teams include your primary Cardiologist (physician) and Advanced Practice Providers (APPs -  Physician Assistants and Nurse Practitioners) who all work together to provide you with the care you need, when you need it.  Your next appointment:   2 month(s)  The format for your next appointment:   In Person  Provider:   You may see one of the following Advanced Practice Providers on your designated Care Team:   Nicolasa Ducking, NP Eula Listen, PA-C Cadence Fransico Michael, PA-C{  Other Instructions You have been referred to cardiac rehab.   Important Information About Sugar

## 2021-11-14 ENCOUNTER — Ambulatory Visit: Payer: Self-pay | Admitting: Nurse Practitioner

## 2021-12-12 ENCOUNTER — Ambulatory Visit: Payer: Self-pay | Admitting: Medical

## 2021-12-12 ENCOUNTER — Encounter: Payer: Self-pay | Admitting: Medical

## 2021-12-12 NOTE — Progress Notes (Deleted)
Cardiology Office Note:    Date:  12/12/2021   ID:  Jeffery Sweeney, DOB 17-Jun-1960, MRN [id number]  PCP:  Wellington Hampshire, MD  [facility] Cardiologist:  None  [facility] Electrophysiologist:  None   Referring MD: Wellington Hampshire, MD   Chief Complaint: 2 month follow-up  History of Present Illness:    Jeffery Sweeney is a 61 y.o. male with a hx of self reported MI x3 (5 years prior) with subsequent PCI and CABGx3, HTN, h/o tobacco use, HLD, h/o stroke 06/2021 who is being seen for hospital follow-up.    Seen 07/14/20 for chest pain similar to prior MI. He underwent LHC that showed 3V CAD, LAD disease moderate with significant FFR/IFR ratio 0.74. Also noted severe stenosis in the bifurcating first diagnoal, second diag, and RCA. Lcx also with disease. Echo showed EF 55-60%, NR WMA, mild LVH. Recommendations for transfer to [facility] for CABG. He underwent CABG x3 (LIMA to LAD, rSVG-PDA, SVG-ramus) 2/21. ABIs normal bilaterally.    Seen in the ED 10/10/20 for chest pain, felt to be atypical. Troponin negative x2 and EKG without changes. CXR and labs unremarkable. Cardiologist was consulted, low suspicion for ACS, and he was discharged home. At follow-up in the office a Myoview Lexiscan was ordered. Myoview showed no evidence of ischemia with normal  EF.    Admitted 06/2021 for TIA. Echo showed LVEF 60-65%, no WMA, G1DD,  mild MR.   Seen in the office 09/20/21 for chest pain and he was set up for a cardiac cath. LHC showed underlying 2V CAD with significant disease in the circumflex that was not revascularized. He also had an 80% ostial stenosis of the LIMA to LAD. There was loss of flow in the LIMA after diagnostic angiography thought to be due to disruption of plaque or vessel closure. He underwent successful DES placement and aspiration thrombectomy of the LIMA to LAD with restoration of TIMI-3 flow. He did have residual thrombus and was started on Aggrastat.  There was the appearance of what  seems to be dissection in the LIMA but it was nonflow limiting and medical therapy was recommended.  The patient was chest pain-free and continued to do well for about 6 hours but around 6 PM, he started having recurrent chest pain with elevated blood pressure, repeat EKG showed few millimeters of anterolateral ST elevation. He was taken back to the cath lab for relook angiography and possible PCI. Emergent cardiac catheterization was done via the right femoral artery which confirmed this.  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  The patient left the Cath Lab chest pain-free.  He was switched from clopidogrel to ticagrelor which should be continued indefinitely if possible. Echo showed LVEF 50-55%, mild LVH, trivial MR. He was discharged in stable condition.   Last seen 10/11/21 and was doing well. He was referred to cardiac rehab.   Today,   Past Medical History:  Diagnosis Date   CVA (cerebral vascular accident) ([place]) 06/2021   Small acute/subacute infarcts in the left occipital lobe and   Hyperlipidemia LDL goal <70    started on Repatha 09/26/2021, continue Crestor 40mg  daily   Hypertension    Multiple vessel coronary artery disease    Myocardial infarct (HCC)    x 3; PCI with single stent placement for each (in [place])   NSTEMI (non-ST elevated myocardial infarction) ([place])  LHC 2/18 with multivessel CAD and transfer to [facility] for CABG eval   STEMI (ST elevation myocardial infarction) (HCC) 09/23/2021   LHC 09/23/2021 x 2   Tobacco use    2 cigarettes daily   Unstable angina (HCC)    LHC 09/23/2021 x 2; total 8 DES to LIMA    Past Surgical History:  Procedure Laterality Date   CORONARY ANGIOPLASTY WITH STENT PLACEMENT     in [place] in 2018-19   CORONARY ARTERY BYPASS GRAFT N/A 07/09/2020   Procedure: CORONARY ARTERY BYPASS GRAFTING (CABG) x THREE , USING LEFT  INTERNAL MAMMARY ARTERY, AND RIGHT LEG GREATER SAPHENOUS VEIN HARVESTED ENDOSCOPICALLY;  Surgeon: Delight Ovens, MD;  Location: [facility] OR;  Service: Open Heart Surgery;  Laterality: N/A;   CORONARY STENT INTERVENTION N/A 09/23/2021   Procedure: CORONARY STENT INTERVENTION;  Surgeon: Iran Ouch, MD;  Location: [facility] INVASIVE CV LAB;  Service: Cardiovascular;  Laterality: N/A;   CORONARY/GRAFT ACUTE MI REVASCULARIZATION N/A 09/23/2021   Procedure: Coronary/Graft Acute MI Revascularization;  Surgeon: Iran Ouch, MD;  Location: [facility] INVASIVE CV LAB;  Service: Cardiovascular;  Laterality: N/A;   INTRAVASCULAR PRESSURE WIRE/FFR STUDY N/A 07/06/2020   Procedure: INTRAVASCULAR PRESSURE WIRE/FFR STUDY;  Surgeon: Iran Ouch, MD;  Location: [facility] INVASIVE CV LAB;  Service: Cardiovascular;  Laterality: N/A;  LAD   LEFT HEART CATH AND CORONARY ANGIOGRAPHY N/A 07/06/2020   Procedure: LEFT HEART CATH AND CORONARY ANGIOGRAPHY;  Surgeon: Iran Ouch, MD;  Location: [facility] INVASIVE CV LAB;  Service: Cardiovascular;  Laterality: N/A;   LEFT HEART CATH AND CORONARY ANGIOGRAPHY N/A 09/23/2021   Procedure: LEFT HEART CATH AND CORONARY ANGIOGRAPHY;  Surgeon: Iran Ouch, MD;  Location: [facility] INVASIVE CV LAB;  Service: Cardiovascular;  Laterality: N/A;   LEFT HEART CATH AND CORS/GRAFTS ANGIOGRAPHY N/A 09/23/2021   Procedure: LEFT HEART CATH AND CORS/GRAFTS ANGIOGRAPHY;  Surgeon: Iran Ouch, MD;  Location: [facility] INVASIVE CV LAB;  Service: Cardiovascular;  Laterality: N/A;   TEE WITHOUT CARDIOVERSION N/A 07/09/2020   Procedure: TRANSESOPHAGEAL ECHOCARDIOGRAM (TEE);  Surgeon: Delight Ovens, MD;  Location: [facility] OR;  Service: Open Heart Surgery;  Laterality: N/A;   WRIST SURGERY Left    for cyst    Current Medications: No outpatient medications have been marked as taking for the 12/12/21 encounter (Appointment) with Fransico Michael, Carmine Carrozza H, PA-C.     Allergies:   Patient has no known allergies.    Social History   Socioeconomic History   Marital status: Divorced    Spouse name: Not on file   Number of children: Not on file   Years of education: Not on file   Highest education level: Not on file  Occupational History   Not on file  Tobacco Use   Smoking status: Former    Packs/day: 0.50    Types: Cigarettes    Quit date: 07/06/2020    Years since quitting: 1.4   Smokeless tobacco: Never  Vaping Use   Vaping Use: Never used  Substance and Sexual Activity   Alcohol use: Not Currently   Drug use: Not Currently   Sexual activity: Not on file  Other Topics Concern   Not on file  Social History Narrative   Not on file   Social Determinants of Health   Financial Resource Strain: Not on file  Food Insecurity: Not on file  Transportation Needs: Not on file  Physical Activity: Not on file  Stress: Not on file  Social Connections: Not on file  Family History: The patient's ***family history is not on file.  ROS:   Please see the history of present illness.    *** All other systems reviewed and are negative.  EKGs/Labs/Other Studies Reviewed:    The following studies were reviewed today: ***  EKG:  EKG is *** ordered today.  The ekg ordered today demonstrates ***  Recent Labs: 02/05/2021: ALT 17 09/25/2021: BUN 15; Creatinine, Ser 0.88; Hemoglobin 14.4; Platelets 176; Potassium 3.7; Sodium 137  Recent Lipid Panel    Component Value Date/Time   CHOL 151 07/06/2021 0635   TRIG 194 (H) 07/06/2021 0635   HDL 32 (L) 07/06/2021 0635   CHOLHDL 4.7 07/06/2021 0635   VLDL 39 07/06/2021 0635   LDLCALC 80 07/06/2021 0635     Risk Assessment/Calculations:   {Does this patient have ATRIAL FIBRILLATION?:[id number]}   Physical Exam:    VS:  There were no vitals taken for this visit.    Wt Readings from Last 3 Encounters:  10/11/21 191 lb 6.4 oz (86.8 kg)  09/26/21 184 lb 1.6 oz (83.5 kg)  09/20/21 192 lb (87.1 kg)     GEN: *** Well nourished, well  developed in no acute distress HEENT: Normal NECK: No JVD; No carotid bruits LYMPHATICS: No lymphadenopathy CARDIAC: ***RRR, no murmurs, rubs, gallops RESPIRATORY:  Clear to auscultation without rales, wheezing or rhonchi  ABDOMEN: Soft, non-tender, non-distended MUSCULOSKELETAL:  No edema; No deformity  SKIN: Warm and dry NEUROLOGIC:  Alert and oriented x 3 PSYCHIATRIC:  Normal affect   ASSESSMENT:    No diagnosis found. PLAN:    In order of problems listed above:  CAD s/p CABG  HTN  HLD  H?o stroke 06/2021  Disposition: Follow up {follow up:15908} with ***   Shared Decision Making/Informed Consent   {Are you ordering a CV Procedure (e.g. stress test, cath, DCCV, TEE, etc)?   Press F2        :[id number]}    Signed, Danuel Felicetti Ardelle Lesches  12/12/2021 7:19 AM    [facility]

## 2021-12-27 ENCOUNTER — Encounter: Payer: Self-pay | Admitting: Emergency Medicine

## 2021-12-27 ENCOUNTER — Other Ambulatory Visit: Payer: Self-pay

## 2021-12-27 ENCOUNTER — Emergency Department: Payer: Self-pay

## 2021-12-27 ENCOUNTER — Emergency Department
Admission: EM | Admit: 2021-12-27 | Discharge: 2021-12-27 | Disposition: A | Discharge status: Home / Self Care | Payer: Self-pay | Attending: Emergency Medicine | Admitting: Emergency Medicine

## 2021-12-27 DIAGNOSIS — R197 Diarrhea, unspecified: Secondary | ICD-10-CM | POA: Insufficient documentation

## 2021-12-27 DIAGNOSIS — R110 Nausea: Secondary | ICD-10-CM | POA: Insufficient documentation

## 2021-12-27 DIAGNOSIS — I2510 Atherosclerotic heart disease of native coronary artery without angina pectoris: Secondary | ICD-10-CM | POA: Insufficient documentation

## 2021-12-27 DIAGNOSIS — R1013 Epigastric pain: Secondary | ICD-10-CM | POA: Insufficient documentation

## 2021-12-27 LAB — TROPONIN I (HIGH SENSITIVITY)
Troponin I (High Sensitivity): 5 ng/L (ref <18)
Troponin I (High Sensitivity): 5 ng/L (ref <18)

## 2021-12-27 LAB — CBC
HCT: 45.6 % (ref 39.0–52.0)
Hemoglobin: 14.8 g/dL (ref 13.0–17.0)
MCH: 28.2 pg (ref 26.0–34.0)
MCHC: 32.5 g/dL (ref 30.0–36.0)
MCV: 87 fL (ref 80.0–100.0)
Platelets: 232 10*3/uL (ref 150–400)
RBC: 5.24 MIL/uL (ref 4.22–5.81)
RDW: 12.7 % (ref 11.5–15.5)
WBC: 8.1 10*3/uL (ref 4.0–10.5)
nRBC: 0 % (ref 0.0–0.2)

## 2021-12-27 LAB — COMPREHENSIVE METABOLIC PANEL
ALT: 39 U/L (ref 0–44)
AST: 70 U/L — ABNORMAL HIGH (ref 15–41)
Albumin: 3.7 g/dL (ref 3.5–5.0)
Alkaline Phosphatase: 74 U/L (ref 38–126)
Anion gap: 6 (ref 5–15)
BUN: 12 mg/dL (ref 8–23)
CO2: 27 mmol/L (ref 22–32)
Calcium: 8.7 mg/dL — ABNORMAL LOW (ref 8.9–10.3)
Chloride: 108 mmol/L (ref 98–111)
Creatinine, Ser: 1.07 mg/dL (ref 0.61–1.24)
GFR, Estimated: >60 mL/min (ref >60)
Glucose, Bld: 118 mg/dL — ABNORMAL HIGH (ref 70–99)
Potassium: 4.3 mmol/L (ref 3.5–5.1)
Sodium: 141 mmol/L (ref 135–145)
Total Bilirubin: 1.4 mg/dL — ABNORMAL HIGH (ref 0.3–1.2)
Total Protein: 7.5 g/dL (ref 6.5–8.1)

## 2021-12-27 LAB — URINALYSIS, ROUTINE W REFLEX MICROSCOPIC
Bilirubin Urine: NEGATIVE
Glucose, UA: NEGATIVE mg/dL
Hgb urine dipstick: NEGATIVE
Ketones, ur: NEGATIVE mg/dL
Leukocytes,Ua: NEGATIVE
Nitrite: NEGATIVE
Protein, ur: NEGATIVE mg/dL
Specific Gravity, Urine: 1.024 (ref 1.005–1.030)
pH: 5 (ref 5.0–8.0)

## 2021-12-27 LAB — LIPASE, BLOOD: Lipase: 148 U/L — ABNORMAL HIGH (ref 11–51)

## 2021-12-27 MED ORDER — IOHEXOL 300 MG/ML  SOLN
100.0000 mL | Freq: Once | INTRAMUSCULAR | Status: AC | PRN
  Administered 2021-12-27: 100 mL via INTRAVENOUS

## 2021-12-27 MED ORDER — OXYCODONE-ACETAMINOPHEN 5-325 MG PO TABS: 1.0000 | ORAL_TABLET | Freq: Four times a day (QID) | ORAL | 0 refills | Status: DC | PRN

## 2021-12-27 MED ORDER — ONDANSETRON 4 MG PO TBDP: 4.0000 mg | ORAL_TABLET | Freq: Three times a day (TID) | ORAL | 0 refills | Status: AC | PRN

## 2021-12-27 MED ORDER — ONDANSETRON 4 MG PO TBDP: 4.0000 mg | ORAL_TABLET | Freq: Three times a day (TID) | ORAL | 0 refills | Status: DC | PRN

## 2021-12-27 MED ORDER — ALUM & MAG HYDROXIDE-SIMETH 200-200-20 MG/5ML PO SUSP
30.0000 mL | Freq: Once | ORAL | Status: AC
  Administered 2021-12-27: 30 mL via ORAL
  Filled 2021-12-27: qty 30

## 2021-12-27 MED ORDER — ONDANSETRON HCL 4 MG/2ML IJ SOLN
4.0000 mg | Freq: Once | INTRAMUSCULAR | Status: AC
  Administered 2021-12-27: 4 mg via INTRAVENOUS
  Filled 2021-12-27: qty 2

## 2021-12-27 MED ORDER — SODIUM CHLORIDE 0.9 % IV BOLUS
1000.0000 mL | Freq: Once | INTRAVENOUS | Status: AC
  Administered 2021-12-27: 1000 mL via INTRAVENOUS

## 2021-12-27 NOTE — Discharge Instructions (Addendum)
-  Please call the general surgeon listed in these instructions to schedule appointment for follow-up HIDA scan.  -You may take the ondansetron as needed for nausea.  You may take the oxycodone as needed for pain.  Review the educational material provided in regards to a eating plan to minimize inflammation of your gallbladder.  -Return to the emergency department anytime if you begin to experience any new or worsening symptoms.

## 2021-12-27 NOTE — ED Triage Notes (Signed)
Pt presents via POV with complaints of epigastric pain that started yesterday morning with associated belching and nausea. Hx of GERD. Denies CP or SOB.

## 2021-12-27 NOTE — ED Notes (Signed)
See triage note  Presents with some epigastric pain  States pain started last pm   States he has had some nausea  States pain is like burning type pain  No fever  No vomiting but has had belching

## 2021-12-27 NOTE — ED Provider Notes (Signed)
[facility] Provider Note    Event Date/Time   First MD Initiated Contact with Patient 12/27/21 [phone]     (approximate)   History   Chief Complaint Abdominal Pain   HPI Jeffery Sweeney is a 61 y.o. male, history of NSTEMI, stroke, hyperlipidemia, CAD, presents emergency department for evaluation of epigastric pain x 1 day.  Patient describes as a mild discomfort associated with belching and nausea.  Endorses a history of GERD.  He attributes his symptoms to recent ingestion of a new type of chicken that he bought.  Endorses some diarrhea as well.  Denies fever/chills, chest pain, shortness of breath, dysuria, rash/lesions, dizziness/lightheadedness, hematemesis, or hematochezia.  History Limitations: No limitations.        Physical Exam  Triage Vital Signs: ED Triage Vitals  Enc Vitals Group     BP 12/27/21 0435 (!) 169/92     Pulse Rate 12/27/21 0435 66     Resp 12/27/21 0435 18     Temp 12/27/21 0435 97.7 F (36.5 C)     Temp Source 12/27/21 0435 Oral     SpO2 12/27/21 0435 99 %     Weight 12/27/21 0438 192 lb (87.1 kg)     Height 12/27/21 0438 5' 3.5" (1.613 m)     Head Circumference --      Peak Flow --      Pain Score 12/27/21 0438 6     Pain Loc --      Pain Edu? --      Excl. in GC? --     Most recent vital signs: Vitals:   12/27/21 0825 12/27/21 0841  BP: (!) 144/88   Pulse: 68   Resp: 18   Temp:  98 F (36.7 C)  SpO2: 99%     General: Awake, NAD.  Skin: Warm, dry. No rashes or lesions. Eyes: PERRL. Conjunctivae normal.  CV: Good peripheral perfusion.  Resp: Normal effort.  Abd: Soft, non-tender. No distention.  Neuro: At baseline. No gross neurological deficits.   Focused Exam: N/A.  Physical Exam    ED Results / Procedures / Treatments  Labs (all labs ordered are listed, but only abnormal results are displayed) Labs Reviewed  LIPASE, BLOOD - Abnormal; Notable for the following components:      Result Value    Lipase 148 (*)    All other components within normal limits  COMPREHENSIVE METABOLIC PANEL - Abnormal; Notable for the following components:   Glucose, Bld 118 (*)    Calcium 8.7 (*)    AST 70 (*)    Total Bilirubin 1.4 (*)    All other components within normal limits  URINALYSIS, ROUTINE W REFLEX MICROSCOPIC - Abnormal; Notable for the following components:   Color, Urine YELLOW (*)    APPearance CLEAR (*)    All other components within normal limits  CBC  TROPONIN I (HIGH SENSITIVITY)  TROPONIN I (HIGH SENSITIVITY)     EKG Sinus rhythm, rate of 68,   RADIOLOGY  ED Provider Interpretation: I personally reviewed and interpreted these images.  Small amount of fluid suggestive of possible acalculous cholecystitis on ultrasound and CT.  US Abdomen Limited RUQ (LIVER/GB)  Result Date: 12/27/2021 CLINICAL DATA:  Acute right upper quadrant abdominal pain. EXAM: ULTRASOUND ABDOMEN LIMITED RIGHT UPPER QUADRANT COMPARISON:  CT scan of same day. FINDINGS: Gallbladder: No gallstones or wall thickening visualized. No sonographic Murphy sign noted by sonographer. Small amount of pericholecystic fluid is noted. Common bile  duct: Diameter: 4 mm which is within normal limits. Liver: No focal lesion identified. Increased echogenicity of hepatic parenchyma is noted suggesting hepatic steatosis or other diffuse hepatocellular disease. Portal vein is patent on color Doppler imaging with normal direction of blood flow towards the liver. Other: None. IMPRESSION: No cholelithiasis or gallbladder wall thickening is noted. No sonographic Murphy's sign is noted. Small amount of pericholecystic fluid is noted which may be related to adjacent hepatocellular disease, but acalculous cholecystitis cannot be excluded. HIDA scan may be performed for further evaluation. Electronically Signed   By: Lupita Raider M.D.   On: 12/27/2021 09:14   CT Abdomen Pelvis W Contrast  Result Date: 12/27/2021 CLINICAL DATA:   Epigastric abdominal pain. EXAM: CT ABDOMEN AND PELVIS WITH CONTRAST TECHNIQUE: Multidetector CT imaging of the abdomen and pelvis was performed using the standard protocol following bolus administration of intravenous contrast. RADIATION DOSE REDUCTION: This exam was performed according to the departmental dose-optimization program which includes automated exposure control, adjustment of the mA and/or kV according to patient size and/or use of iterative reconstruction technique. CONTRAST:  OMNIPAQUE IOHEXOL 300 MG/ML  SOLN COMPARISON:  March 02, 2019. FINDINGS: Lower chest: No acute abnormality. Hepatobiliary: No gallstones or biliary dilatation is noted. Hepatic steatosis is noted. Mild pericholecystic fluid is noted suggesting cholecystitis. Pancreas: Unremarkable. No pancreatic ductal dilatation or surrounding inflammatory changes. Spleen: Normal in size without focal abnormality. Adrenals/Urinary Tract: Adrenal glands appear normal. Stable left renal cyst is noted for which no further follow-up is required. No hydronephrosis or renal obstruction is noted. Urinary bladder is unremarkable. Stomach/Bowel: Stomach is within normal limits. Appendix appears normal. No evidence of bowel wall thickening, distention, or inflammatory changes. Sigmoid diverticulosis is noted without inflammation. Vascular/Lymphatic: Aortic atherosclerosis. No enlarged abdominal or pelvic lymph nodes. Reproductive: Prostate is unremarkable. Other: No abdominal wall hernia or abnormality. No abdominopelvic ascites. Musculoskeletal: No acute or significant osseous findings. IMPRESSION: No cholelithiasis is noted, but mild pericholecystic fluid is noted suggesting cholecystitis. Ultrasound is recommended for further evaluation. Hepatic steatosis. Sigmoid diverticulosis without inflammation. Aortic Atherosclerosis (ICD10-I70.0). Electronically Signed   By: Lupita Raider M.D.   On: 12/27/2021 08:53    PROCEDURES:  Critical Care  performed: N/A.  Procedures    MEDICATIONS ORDERED IN ED: Medications  sodium chloride 0.9 % bolus 1,000 mL (1,000 mLs Intravenous New Bag/Given 12/27/21 0907)  ondansetron (ZOFRAN) injection 4 mg (4 mg Intravenous Given 12/27/21 0907)  iohexol (OMNIPAQUE) 300 MG/ML solution 100 mL (100 mLs Intravenous Contrast Given 12/27/21 0835)  alum & mag hydroxide-simeth (MAALOX/MYLANTA) 200-200-20 MG/5ML suspension 30 mL (30 mLs Oral Given 12/27/21 1008)     IMPRESSION / MDM / ASSESSMENT AND PLAN / ED COURSE  I reviewed the triage vital signs and the nursing notes.                              Differential diagnosis includes, but is not limited to, pancreatitis, cholecystitis, choledocholithiasis, cholelithiasis, gastroenteritis, gastritis, ureterolithiasis, cystitis, pyelonephritis.  ED Course Patient appears well, vitals within normal limits.  NAD.  CBC shows no leukocytosis or anemia.  CMP shows no transaminitis, AKI, or electrolyte abnormalities.  Lipase is notably elevated at 148.  No history of significant alcohol use or gallstones.  Initial troponin 5.  Second troponin 5.  EKG unremarkable.  Unlikely ACS.  Urinalysis shows no evidence of infection.  Assessment/Plan Patient presents with epigastric pain x 1 day.  Overall he  appears well clinically.  Physical exam is unremarkable.  Lab workup is reassuring.  Initially suspicious for pancreatitis given elevated lipase, though CT scan does not support this diagnosis.  Possible gastroenteritis given the nausea/diarrhea associated with ingestion of suspicious chicken.  His CT scan and ultrasound showed some signs of possible acalculous cholecystitis, though his presentation does not match this description.  Spoke with the on-call general surgeon, Dr. Claudine Mouton, who advised that the patient would be appropriate for outpatient follow-up for HIDA scan to confirm.  Suspect likely biliary dyskinesia or transient passage of gallstone.  Treated the  patient here with GI cocktail.  Will provide him with prescriptions for ondansetron and oxycodone/acetaminophen to treat his symptoms in the meantime.  Will discharge.  Considered admission for this patient, but given his stable presentation and unremarkable workup, he is unlikely to benefit from admission.  Provided the patient with anticipatory guidance, return precautions, and educational material. Encouraged the patient to return to the emergency department at any time if they begin to experience any new or worsening symptoms. Patient expressed understanding and agreed with the plan.   Patient's presentation is most consistent with acute complicated illness / injury requiring diagnostic workup.       FINAL CLINICAL IMPRESSION(S) / ED DIAGNOSES   Final diagnoses:  Epigastric pain     Rx / DC Orders   ED Discharge Orders          Ordered    ondansetron (ZOFRAN-ODT) 4 MG disintegrating tablet  Every 8 hours PRN,   Status:  Discontinued        12/27/21 1025    oxyCODONE-acetaminophen (PERCOCET/ROXICET) 5-325 MG tablet  Every 6 hours PRN        12/27/21 1025    ondansetron (ZOFRAN-ODT) 4 MG disintegrating tablet  Every 8 hours PRN        12/27/21 1026             Note:  This document was prepared using Dragon voice recognition software and may include unintentional dictation errors.   Varney Daily, [name] 12/27/21 1027    Arnaldo Natal, MD 12/27/21 1547

## 2021-12-28 ENCOUNTER — Other Ambulatory Visit: Payer: Self-pay

## 2021-12-28 ENCOUNTER — Emergency Department: Payer: Self-pay

## 2021-12-28 ENCOUNTER — Encounter: Payer: Self-pay | Admitting: Emergency Medicine

## 2021-12-28 ENCOUNTER — Inpatient Hospital Stay
Admission: EM | Admit: 2021-12-28 | Discharge: 2022-01-01 | DRG: 871 | Disposition: A | Discharge status: Home / Self Care | Payer: Self-pay | Attending: Internal Medicine | Admitting: Internal Medicine

## 2021-12-28 DIAGNOSIS — J9601 Acute respiratory failure with hypoxia: Secondary | ICD-10-CM | POA: Diagnosis present

## 2021-12-28 DIAGNOSIS — A419 Sepsis, unspecified organism: Principal | ICD-10-CM | POA: Diagnosis present

## 2021-12-28 DIAGNOSIS — Z7982 Long term (current) use of aspirin: Secondary | ICD-10-CM

## 2021-12-28 DIAGNOSIS — R0902 Hypoxemia: Secondary | ICD-10-CM

## 2021-12-28 DIAGNOSIS — Z951 Presence of aortocoronary bypass graft: Secondary | ICD-10-CM

## 2021-12-28 DIAGNOSIS — I2510 Atherosclerotic heart disease of native coronary artery without angina pectoris: Secondary | ICD-10-CM | POA: Diagnosis present

## 2021-12-28 DIAGNOSIS — Z87891 Personal history of nicotine dependence: Secondary | ICD-10-CM

## 2021-12-28 DIAGNOSIS — J189 Pneumonia, unspecified organism: Secondary | ICD-10-CM

## 2021-12-28 DIAGNOSIS — Z6834 Body mass index (BMI) 34.0-34.9, adult: Secondary | ICD-10-CM

## 2021-12-28 DIAGNOSIS — I252 Old myocardial infarction: Secondary | ICD-10-CM

## 2021-12-28 DIAGNOSIS — Z8673 Personal history of transient ischemic attack (TIA), and cerebral infarction without residual deficits: Secondary | ICD-10-CM

## 2021-12-28 DIAGNOSIS — Z79899 Other long term (current) drug therapy: Secondary | ICD-10-CM

## 2021-12-28 DIAGNOSIS — I10 Essential (primary) hypertension: Secondary | ICD-10-CM | POA: Diagnosis present

## 2021-12-28 DIAGNOSIS — R079 Chest pain, unspecified: Secondary | ICD-10-CM

## 2021-12-28 DIAGNOSIS — E785 Hyperlipidemia, unspecified: Secondary | ICD-10-CM | POA: Diagnosis present

## 2021-12-28 DIAGNOSIS — J40 Bronchitis, not specified as acute or chronic: Secondary | ICD-10-CM

## 2021-12-28 DIAGNOSIS — Z955 Presence of coronary angioplasty implant and graft: Secondary | ICD-10-CM

## 2021-12-28 DIAGNOSIS — K219 Gastro-esophageal reflux disease without esophagitis: Secondary | ICD-10-CM | POA: Diagnosis present

## 2021-12-28 DIAGNOSIS — E669 Obesity, unspecified: Secondary | ICD-10-CM

## 2021-12-28 DIAGNOSIS — K810 Acute cholecystitis: Secondary | ICD-10-CM

## 2021-12-28 DIAGNOSIS — B9689 Other specified bacterial agents as the cause of diseases classified elsewhere: Secondary | ICD-10-CM

## 2021-12-28 DIAGNOSIS — R6520 Severe sepsis without septic shock: Secondary | ICD-10-CM

## 2021-12-28 DIAGNOSIS — Z20822 Contact with and (suspected) exposure to covid-19: Secondary | ICD-10-CM | POA: Diagnosis present

## 2021-12-28 LAB — CBC
HCT: 39.5 % (ref 39.0–52.0)
Hemoglobin: 13.1 g/dL (ref 13.0–17.0)
MCH: 28.7 pg (ref 26.0–34.0)
MCHC: 33.2 g/dL (ref 30.0–36.0)
MCV: 86.6 fL (ref 80.0–100.0)
Platelets: 164 10*3/uL (ref 150–400)
RBC: 4.56 MIL/uL (ref 4.22–5.81)
RDW: 12.9 % (ref 11.5–15.5)
WBC: 4.2 10*3/uL (ref 4.0–10.5)
nRBC: 0 % (ref 0.0–0.2)

## 2021-12-28 LAB — BASIC METABOLIC PANEL
Anion gap: 7 (ref 5–15)
BUN: 16 mg/dL (ref 8–23)
CO2: 23 mmol/L (ref 22–32)
Calcium: 8.2 mg/dL — ABNORMAL LOW (ref 8.9–10.3)
Chloride: 106 mmol/L (ref 98–111)
Creatinine, Ser: 1.09 mg/dL (ref 0.61–1.24)
GFR, Estimated: >60 mL/min (ref >60)
Glucose, Bld: 130 mg/dL — ABNORMAL HIGH (ref 70–99)
Potassium: 4 mmol/L (ref 3.5–5.1)
Sodium: 136 mmol/L (ref 135–145)

## 2021-12-28 LAB — RESP PANEL BY RT-PCR (FLU A&B, COVID) ARPGX2
Influenza A by PCR: NEGATIVE
Influenza B by PCR: NEGATIVE
SARS Coronavirus 2 by RT PCR: NEGATIVE

## 2021-12-28 LAB — TROPONIN I (HIGH SENSITIVITY)
Troponin I (High Sensitivity): 12 ng/L (ref <18)
Troponin I (High Sensitivity): 16 ng/L (ref <18)
Troponin I (High Sensitivity): 9 ng/L (ref <18)

## 2021-12-28 MED ORDER — ACETAMINOPHEN 500 MG PO TABS
1000.0000 mg | ORAL_TABLET | Freq: Once | ORAL | Status: AC
  Administered 2021-12-28: 1000 mg via ORAL
  Filled 2021-12-28: qty 2

## 2021-12-28 MED ORDER — ZOLPIDEM TARTRATE 5 MG PO TABS: 5.0000 mg | ORAL_TABLET | Freq: Every evening | ORAL | Status: DC | PRN

## 2021-12-28 MED ORDER — IPRATROPIUM-ALBUTEROL 0.5-2.5 (3) MG/3ML IN SOLN
3.0000 mL | Freq: Once | RESPIRATORY_TRACT | Status: AC
  Administered 2021-12-28: 3 mL via RESPIRATORY_TRACT
  Filled 2021-12-28: qty 3

## 2021-12-28 MED ORDER — SODIUM CHLORIDE 0.9 % IV SOLN
500.0000 mg | Freq: Once | INTRAVENOUS | Status: AC
  Administered 2021-12-28: 500 mg via INTRAVENOUS
  Filled 2021-12-28: qty 5

## 2021-12-28 MED ORDER — IOHEXOL 350 MG/ML SOLN
75.0000 mL | Freq: Once | INTRAVENOUS | Status: AC | PRN
  Administered 2021-12-28: 75 mL via INTRAVENOUS

## 2021-12-28 MED ORDER — SODIUM CHLORIDE 0.9 % IV SOLN
1.0000 g | Freq: Once | INTRAVENOUS | Status: AC
  Administered 2021-12-28: 1 g via INTRAVENOUS
  Filled 2021-12-28: qty 10

## 2021-12-28 MED ORDER — SODIUM CHLORIDE 0.9 % IV BOLUS
1000.0000 mL | Freq: Once | INTRAVENOUS | Status: AC
  Administered 2021-12-28: 1000 mL via INTRAVENOUS

## 2021-12-28 MED ORDER — SODIUM CHLORIDE 0.9 % IV SOLN: INTRAVENOUS | Status: DC

## 2021-12-28 NOTE — ED Triage Notes (Addendum)
Pt via EMS from home c/o mid-sternal chest pain that started last night. Also endorses NV. 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 EMS gave 324 ASA. On arrival, pt RA sat 83%, no hx of chronic lung issues. Placed patient on 4L [place] at this time, O2 95%/ Pt is A&OX4 and NAD.

## 2021-12-28 NOTE — ED Triage Notes (Signed)
Pt in via EMS from home with c/o CP. Pain is sharp substernal 8/10. Pt was given 2 sublingual nitro and 324mg  asa. #20 g to right AC. Pain 4/10. Hx of CABG 2 years ago, 188/112, HR 113, 98% RA, cbg 125

## 2021-12-28 NOTE — H&P (Signed)
History and Physical   [facility] [place] @ [facility] Admission History and Physical [employer], D.O.    Patient Name: Jeffery Sweeney MR#: [id number] Date of Birth: [birth year] Date of Admission: 12/28/2021  Referring MD/NP/PA: Dr. Lenard Lance  Primary Care Physician: Iran Ouch, MD  Chief Complaint:  Chief Complaint  Patient presents with   Chest Pain   HPI: Jeffery Sweeney is a 61 y.o. male with a known history of Hypertension, hyperlipidemia, CAD status post MI, CVA presents to the emergency department for evaluation of chest pain.  Patient was in a usual state of health until last night when he developed sudden onset of midsternal chest pain described as 8 out of 10.  Reports difficulty breathing with exertion, shortness of breath and cough.  States that his roommate has had a cough and fever as well.    Patient denies weakness, dizziness, N/V/C/D, abdominal pain, dysuria/frequency, changes in mental status.    Otherwise there has been no change in status. Patient has been taking medication as prescribed and there has been no recent change in medication or diet.  No recent antibiotics.  There has been no recent illness, hospitalizations, travel.  EMS/ED Course: Patient received patient received aspirin and sublingual nitro from EMS.  He received Tylenol, azithromycin, Rocephin, DuoNeb and normal saline in the emergency department.  He was also found to be febrile and hypoxic with O2 sats in the low 80s.  Medical admission has been requested for further management of chest pain, community-acquired pneumonia with hypoxia.  Review of Systems:  CONSTITUTIONAL: No fever/chills, fatigue, weakness, weight gain/loss, headache. EYES: No blurry or double vision. ENT: No tinnitus, postnasal drip, redness or soreness of the oropharynx. RESPIRATORY: Positive cough, dyspnea, wheeze.  No hemoptysis.  CARDIOVASCULAR: Positive chest pain, negative palpitations, syncope,  orthopnea. No lower extremity edema.  GASTROINTESTINAL: No nausea, vomiting, abdominal pain, diarrhea, constipation.  No hematemesis, melena or hematochezia. GENITOURINARY: No dysuria, frequency, hematuria. ENDOCRINE: No polyuria or nocturia. No heat or cold intolerance. HEMATOLOGY: No anemia, bruising, bleeding. INTEGUMENTARY: No rashes, ulcers, lesions. MUSCULOSKELETAL: No arthritis, gout. NEUROLOGIC: No numbness, tingling, ataxia, seizure-type activity, weakness. PSYCHIATRIC: No anxiety, depression, insomnia.   Past Medical History:  Diagnosis Date   CVA (cerebral vascular accident) (HCC) 06/2021   Small acute/subacute infarcts in the left occipital lobe and   Hyperlipidemia LDL goal <70    started on Repatha 09/26/2021, continue Crestor  daily   Hypertension    Multiple vessel coronary artery disease    Myocardial infarct (HCC)    x 3; PCI with single stent placement for each (in [place])   NSTEMI (non-ST elevated myocardial infarction) (HCC)    LHC 2/18 with multivessel CAD and transfer to [facility] for CABG eval   STEMI (ST elevation myocardial infarction) (HCC) 09/23/2021   LHC 09/23/2021 x 2   Tobacco use    2 cigarettes daily   Unstable angina (HCC)    LHC 09/23/2021 x 2; total 8 DES to LIMA    Past Surgical History:  Procedure Laterality Date   CORONARY ANGIOPLASTY WITH STENT PLACEMENT     in [place] in 2018-19   CORONARY ARTERY BYPASS GRAFT N/A 07/09/2020   Procedure: CORONARY ARTERY BYPASS GRAFTING (CABG) x THREE , USING LEFT INTERNAL MAMMARY ARTERY, AND RIGHT LEG GREATER SAPHENOUS VEIN HARVESTED ENDOSCOPICALLY;  Surgeon: Delight Ovens, MD;  Location: [facility] OR;  Service: Open Heart Surgery;  Laterality: N/A;   CORONARY STENT INTERVENTION N/A 09/23/2021   Procedure: CORONARY STENT INTERVENTION;  Surgeon: Iran Ouch, MD;  Location: [facility] INVASIVE CV LAB;  Service: Cardiovascular;  Laterality: N/A;   CORONARY/GRAFT ACUTE MI REVASCULARIZATION N/A 09/23/2021    Procedure: Coronary/Graft Acute MI Revascularization;  Surgeon: Iran Ouch, MD;  Location: [facility] INVASIVE CV LAB;  Service: Cardiovascular;  Laterality: N/A;   INTRAVASCULAR PRESSURE WIRE/FFR STUDY N/A 07/06/2020   Procedure: INTRAVASCULAR PRESSURE WIRE/FFR STUDY;  Surgeon: Iran Ouch, MD;  Location: [facility] INVASIVE CV LAB;  Service: Cardiovascular;  Laterality: N/A;  LAD   LEFT HEART CATH AND CORONARY ANGIOGRAPHY N/A 07/06/2020   Procedure: LEFT HEART CATH AND CORONARY ANGIOGRAPHY;  Surgeon: Iran Ouch, MD;  Location: [facility] INVASIVE CV LAB;  Service: Cardiovascular;  Laterality: N/A;   LEFT HEART CATH AND CORONARY ANGIOGRAPHY N/A 09/23/2021   Procedure: LEFT HEART CATH AND CORONARY ANGIOGRAPHY;  Surgeon: Iran Ouch, MD;  Location: [facility] INVASIVE CV LAB;  Service: Cardiovascular;  Laterality: N/A;   LEFT HEART CATH AND CORS/GRAFTS ANGIOGRAPHY N/A 09/23/2021   Procedure: LEFT HEART CATH AND CORS/GRAFTS ANGIOGRAPHY;  Surgeon: Iran Ouch, MD;  Location: [facility] INVASIVE CV LAB;  Service: Cardiovascular;  Laterality: N/A;   TEE WITHOUT CARDIOVERSION N/A 07/09/2020   Procedure: TRANSESOPHAGEAL ECHOCARDIOGRAM (TEE);  Surgeon: Delight Ovens, MD;  Location: [facility] OR;  Service: Open Heart Surgery;  Laterality: N/A;   WRIST SURGERY Left    for cyst     reports that he quit smoking about 17 months ago. His smoking use included cigarettes. He smoked an average of .5 packs per day. He has never used smokeless tobacco. He reports that he does not currently use alcohol. He reports that he does not currently use drugs.  No Known Allergies  History reviewed. No pertinent family history.  Prior to Admission medications   Medication Sig Start Date End Date Taking? Authorizing Provider  aspirin 81 MG chewable tablet Chew 1 tablet (81 mg total) by mouth daily. 09/27/21   Hammock, Lavonna Rua, NP  Evolocumab (REPATHA SURECLICK) 140 MG/ML SOAJ Inject 140 mg into the skin every 14 (fourteen) days.  09/25/21   Debbe Odea, MD  meclizine (ANTIVERT) 25 MG tablet Take 1 tablet (25 mg total) by mouth 3 (three) times daily as needed for dizziness. 07/05/21   Minna Antis, MD  metoprolol tartrate (LOPRESSOR) 50 MG tablet Take 1 tablet (50 mg total) by mouth 2 (two) times daily. 10/11/21   Furth, Cadence H, PA-C  nitroGLYCERIN (NITROSTAT) 0.4 MG SL tablet Place 1 tablet (0.4 mg total) under the tongue every 5 (five) minutes as needed for chest pain. 10/05/20   Marisue Ivan D, PA-C  nitroGLYCERIN (NITROSTAT) 0.4 MG SL tablet Place 1 tablet (0.4 mg total) under the tongue every 5 (five) minutes as needed for chest pain. 09/26/21   Charlsie Quest, NP  ondansetron (ZOFRAN-ODT) 4 MG disintegrating tablet Take 1 tablet (4 mg total) by mouth every 8 (eight) hours as needed for up to 10 days for nausea or vomiting. 12/27/21 01/06/22  Varney Daily, PA  oxyCODONE-acetaminophen (PERCOCET/ROXICET) 5-325 MG tablet Take 1 tablet by mouth every 6 (six) hours as needed for up to 7 days for severe pain. 12/27/21 01/03/22  Varney Daily, PA  pantoprazole (PROTONIX) 40 MG tablet Take 1 tablet (40 mg total) by mouth once daily. 10/09/21 01/07/22  Furth, Cadence H, PA-C  rosuvastatin (CRESTOR) 40 MG tablet Take 1 tablet (40 mg total) by mouth once daily. 10/09/21 01/07/22  Furth, Cadence H, PA-C  ticagrelor (BRILINTA) 90 MG TABS tablet  Take 1 tablet (90 mg total) by mouth 2 (two) times daily. 09/26/21   Charlsie Quest, NP    Physical Exam: Vitals:   12/28/21 1505 12/28/21 1654 12/28/21 1700 12/28/21 1800  BP: 138/80 121/69 119/78 112/66  Pulse: (!) 103 97 96 94  Resp: 20 10 14 14   Temp: (!) 100.5 F (38.1 C)   98.4 F (36.9 C)  TempSrc: Oral   Oral  SpO2: (!) 83% 95% 97% 97%  Weight:      Height:        GENERAL: 61 y.o.-year-old white male patient, well-developed, well-nourished sitting up in bed in no acute distress.  Pleasant and cooperative.   HEENT: Head atraumatic, normocephalic. Pupils  equal. Mucus membranes moist. NECK: Supple. No JVD. CHEST: Normal breath sounds bilaterally. No wheezing, rales, rhonchi or crackles. No use of accessory muscles of respiration.  No reproducible chest wall tenderness.  CARDIOVASCULAR: S1, S2 normal. No murmurs, rubs, or gallops. Cap refill <2 seconds. Pulses intact distally.  ABDOMEN: Soft, nondistended, nontender. No rebound, guarding, rigidity. Normoactive bowel sounds present in all four quadrants.  EXTREMITIES: No pedal edema, cyanosis, or clubbing. No calf tenderness or Homan's sign.  NEUROLOGIC: The patient is alert and oriented x 3. Cranial nerves II through XII are grossly intact with no focal sensorimotor deficit. PSYCHIATRIC:  Normal affect, mood, thought content. SKIN: Warm, dry, and intact without obvious rash, lesion, or ulcer.    Labs on Admission:  CBC: Recent Labs  Lab 12/27/21 0443 12/28/21 1506  WBC 8.1 4.2  HGB 14.8 13.1  HCT 45.6 39.5  MCV 87.0 86.6  PLT 232 164   Basic Metabolic Panel: Recent Labs  Lab 12/27/21 0443 12/28/21 1506  NA 141 136  K 4.3 4.0  CL 108 106  CO2 27 23  GLUCOSE 118* 130*  BUN 12 16  CREATININE 1.07 1.09  CALCIUM 8.7* 8.2*   GFR: Estimated Creatinine Clearance: 68.7 mL/min (by C-G formula based on SCr of 1.09 mg/dL). Liver Function Tests: Recent Labs  Lab 12/27/21 0443  AST 70*  ALT 39  ALKPHOS 74  BILITOT 1.4*  PROT 7.5  ALBUMIN 3.7   Recent Labs  Lab 12/27/21 0443  LIPASE 148*   No results for input(s): "AMMONIA" in the last 168 hours. Coagulation Profile: No results for input(s): "INR", "PROTIME" in the last 168 hours. Cardiac Enzymes: No results for input(s): "CKTOTAL", "CKMB", "CKMBINDEX", "TROPONINI" in the last 168 hours. BNP (last 3 results) No results for input(s): "PROBNP" in the last 8760 hours. HbA1C: No results for input(s): "HGBA1C" in the last 72 hours. CBG: No results for input(s): "GLUCAP" in the last 168 hours. Lipid Profile: No results  for input(s): "CHOL", "HDL", "LDLCALC", "TRIG", "CHOLHDL", "LDLDIRECT" in the last 72 hours. Thyroid Function Tests: No results for input(s): "TSH", "T4TOTAL", "FREET4", "T3FREE", "THYROIDAB" in the last 72 hours. Anemia Panel: No results for input(s): "VITAMINB12", "FOLATE", "FERRITIN", "TIBC", "IRON", "RETICCTPCT" in the last 72 hours. Urine analysis:    Component Value Date/Time   COLORURINE YELLOW (A) 12/27/2021 0443   APPEARANCEUR CLEAR (A) 12/27/2021 0443   LABSPEC 1.024 12/27/2021 0443   PHURINE 5.0 12/27/2021 0443   GLUCOSEU NEGATIVE 12/27/2021 0443   HGBUR NEGATIVE 12/27/2021 0443   BILIRUBINUR NEGATIVE 12/27/2021 0443   KETONESUR NEGATIVE 12/27/2021 0443   PROTEINUR NEGATIVE 12/27/2021 0443   NITRITE NEGATIVE 12/27/2021 0443   LEUKOCYTESUR NEGATIVE 12/27/2021 0443   Sepsis Labs: @LABRCNTIP (procalcitonin:4,lacticidven:4) ) Recent Results (from the past 240 hour(s))  Resp Panel by  RT-PCR (Flu A&B, Covid) Anterior Nasal Swab     Status: None   Collection Time: 12/28/21  4:55 PM   Specimen: Anterior Nasal Swab  Result Value Ref Range Status   SARS Coronavirus 2 by RT PCR NEGATIVE NEGATIVE Final    Comment: (NOTE) SARS-CoV-2 target nucleic acids are NOT DETECTED.  The SARS-CoV-2 RNA is generally detectable in upper respiratory specimens during the acute phase of infection. The lowest concentration of SARS-CoV-2 viral copies this assay can detect is 138 copies/mL. A negative result does not preclude SARS-Cov-2 infection and should not be used as the sole basis for treatment or other patient management decisions. A negative result may occur with  improper specimen collection/handling, submission of specimen other than nasopharyngeal swab, presence of viral mutation(s) within the areas targeted by this assay, and inadequate number of viral copies(<138 copies/mL). A negative result must be combined with clinical observations, patient history, and  epidemiological information. The expected result is Negative.  Fact Sheet for Patients:  BloggerCourse.com  Fact Sheet for Healthcare Providers:  SeriousBroker.it  This test is no t yet approved or cleared by the [place] FDA and  has been authorized for detection and/or diagnosis of SARS-CoV-2 by FDA under an Emergency Use Authorization (EUA). This EUA will remain  in effect (meaning this test can be used) for the duration of the COVID-19 declaration under Section 564(b)(1) of the Act, 21 U.S.C.section 360bbb-3(b)(1), unless the authorization is terminated  or revoked sooner.       Influenza A by PCR NEGATIVE NEGATIVE Final   Influenza B by PCR NEGATIVE NEGATIVE Final    Comment: (NOTE) The Xpert Xpress SARS-CoV-2/FLU/RSV plus assay is intended as an aid in the diagnosis of influenza from Nasopharyngeal swab specimens and should not be used as a sole basis for treatment. Nasal washings and aspirates are unacceptable for Xpert Xpress SARS-CoV-2/FLU/RSV testing.  Fact Sheet for Patients: BloggerCourse.com  Fact Sheet for Healthcare Providers: SeriousBroker.it  This test is not yet approved or cleared by the [place] FDA and has been authorized for detection and/or diagnosis of SARS-CoV-2 by FDA under an Emergency Use Authorization (EUA). This EUA will remain in effect (meaning this test can be used) for the duration of the COVID-19 declaration under Section 564(b)(1) of the Act, 21 U.S.C. section 360bbb-3(b)(1), unless the authorization is terminated or revoked.  Performed at [facility], [address] [place], [place] [address]      Radiological Exams on Admission: CT Angio Chest PE W and/or Wo Contrast  Result Date: 12/28/2021 CLINICAL DATA:  Substernal chest pain, high probability PE EXAM: CT ANGIOGRAPHY CHEST WITH CONTRAST TECHNIQUE:  Multidetector CT imaging of the chest was performed using the standard protocol during bolus administration of intravenous contrast. Multiplanar CT image reconstructions and MIPs were obtained to evaluate the vascular anatomy. RADIATION DOSE REDUCTION: This exam was performed according to the departmental dose-optimization program which includes automated exposure control, adjustment of the mA and/or kV according to patient size and/or use of iterative reconstruction technique. CONTRAST:  79mL OMNIPAQUE IOHEXOL 350 MG/ML SOLN COMPARISON:  Radiographs earlier today FINDINGS: Cardiovascular: Sternotomy and CABG. Aortic and coronary artery atherosclerotic calcification. Normal heart size. No pericardial effusion. Mediastinum/Nodes: No thoracic adenopathy by size. Unremarkable thyroid and esophagus. The central airways are patent. Lungs/Pleura: Diffuse bronchial wall thickening and scattered mucous plugging greatest in the lower lungs. Air trapping in the lower lungs. Lower lobe predominant atelectasis/scarring. No pleural effusion or pneumothorax. Upper Abdomen: No acute abnormality. Musculoskeletal: No  chest wall abnormality. No acute or significant osseous findings. Review of the MIP images confirms the above findings. IMPRESSION: 1. Negative for acute pulmonary embolism. 2. Bronchial inflammation and air trapping greatest in the lower lungs. 3.  Aortic Atherosclerosis (ICD10-I70.0). Electronically Signed   By: Minerva Festeryler  Stutzman M.D.   On: 12/28/2021 19:03   DG Chest 2 View  Result Date: 12/28/2021 CLINICAL DATA:  Chest pain EXAM: CHEST - 2 VIEW COMPARISON:  10/10/2020 FINDINGS: Cardiac size is within normal limits. Lung fields are clear of any infiltrates or pulmonary edema. There is no pleural effusion or pneumothorax. There is previous coronary bypass surgery. There is a tubular structure seen in left side of the mediastinum, possibly vascular stents. This finding was not seen in the previous examination.  IMPRESSION: There are no signs of pulmonary edema or focal pulmonary consolidation. Previous coronary bypass surgery. There is a possible vascular stent in left side of mediastinum. Electronically Signed   By: Ernie AvenaPalani  Rathinasamy M.D.   On: 12/28/2021 15:54   US Abdomen Limited RUQ (LIVER/GB)  Result Date: 12/27/2021 CLINICAL DATA:  Acute right upper quadrant abdominal pain. EXAM: ULTRASOUND ABDOMEN LIMITED RIGHT UPPER QUADRANT COMPARISON:  CT scan of same day. FINDINGS: Gallbladder: No gallstones or wall thickening visualized. No sonographic Murphy sign noted by sonographer. Small amount of pericholecystic fluid is noted. Common bile duct: Diameter: 4 mm which is within normal limits. Liver: No focal lesion identified. Increased echogenicity of hepatic parenchyma is noted suggesting hepatic steatosis or other diffuse hepatocellular disease. Portal vein is patent on color Doppler imaging with normal direction of blood flow towards the liver. Other: None. IMPRESSION: No cholelithiasis or gallbladder wall thickening is noted. No sonographic Murphy's sign is noted. Small amount of pericholecystic fluid is noted which may be related to adjacent hepatocellular disease, but acalculous cholecystitis cannot be excluded. HIDA scan may be performed for further evaluation. Electronically Signed   By: Lupita RaiderJames  Green Jr M.D.   On: 12/27/2021 09:14   CT Abdomen Pelvis W Contrast  Result Date: 12/27/2021 CLINICAL DATA:  Epigastric abdominal pain. EXAM: CT ABDOMEN AND PELVIS WITH CONTRAST TECHNIQUE: Multidetector CT imaging of the abdomen and pelvis was performed using the standard protocol following bolus administration of intravenous contrast. RADIATION DOSE REDUCTION: This exam was performed according to the departmental dose-optimization program which includes automated exposure control, adjustment of the mA and/or kV according to patient size and/or use of iterative reconstruction technique. CONTRAST:  100mL OMNIPAQUE  IOHEXOL 300 MG/ML  SOLN COMPARISON:  March 02, 2019. FINDINGS: Lower chest: No acute abnormality. Hepatobiliary: No gallstones or biliary dilatation is noted. Hepatic steatosis is noted. Mild pericholecystic fluid is noted suggesting cholecystitis. Pancreas: Unremarkable. No pancreatic ductal dilatation or surrounding inflammatory changes. Spleen: Normal in size without focal abnormality. Adrenals/Urinary Tract: Adrenal glands appear normal. Stable left renal cyst is noted for which no further follow-up is required. No hydronephrosis or renal obstruction is noted. Urinary bladder is unremarkable. Stomach/Bowel: Stomach is within normal limits. Appendix appears normal. No evidence of bowel wall thickening, distention, or inflammatory changes. Sigmoid diverticulosis is noted without inflammation. Vascular/Lymphatic: Aortic atherosclerosis. No enlarged abdominal or pelvic lymph nodes. Reproductive: Prostate is unremarkable. Other: No abdominal wall hernia or abnormality. No abdominopelvic ascites. Musculoskeletal: No acute or significant osseous findings. IMPRESSION: No cholelithiasis is noted, but mild pericholecystic fluid is noted suggesting cholecystitis. Ultrasound is recommended for further evaluation. Hepatic steatosis. Sigmoid diverticulosis without inflammation. Aortic Atherosclerosis (ICD10-I70.0). Electronically Signed   By: Zenda AlpersJames  Green Jr M.D.  On: 12/27/2021 08:53    EKG: Sinus tach at 104 bpm with normal axis and nonspecific ST-T wave changes.   Assessment/Plan  This is a 61 y.o. male with a history of Hypertension, hyperlipidemia, CAD status post MI, CVA now being admitted with:  #. Acute hypoxic resp failure likely 2/2 Community Acquired Pneumonia vs. COPD h/o Tobacco - Admit to inpatient - IV Rocephin & Azithromycin per pharmacy - IV fluid hydration - Duonebs, expectorants & O2 therapy as needed - Follow up blood & sputum cultures - Will need PFTs as outpatient  #. Chest pain, rule  out ACS - Telemetry monitoring. - Trend troponins, check lipids and TSH. - Morphine, nitro, beta blocker, aspirin and statin ordered.    #. Bedbugs - Decontamination done in ER  #.  History of CAD - Continue Brilinta, aspirin, metoprolol, nitroglycerin  #. History of hyperlipidemia - Continue Crestor  #. History of GERD - Continue Protonix  Admission status: Inpatient telemetry IV Fluids: Normal saline Diet/Nutrition: Heart healthy Consults called: Cardiology DVT Px: Heparin, SCDs and early ambulation. Code Status: Full Code  Disposition Plan: To home in 1-2 days  All the records are reviewed and case discussed with ED provider. Management plans discussed with the patient and/or family who express understanding and agree with plan of care.  Taysean Wager D.O. on 12/28/2021 at 7:47 PM CC: Primary care physician; Iran Ouch, MD   12/28/2021, 7:47 PM

## 2021-12-28 NOTE — ED Provider Notes (Signed)
[facility] Provider Note    Event Date/Time   First MD Initiated Contact with Patient 12/28/21 1615     (approximate)  History   Chief Complaint: Chest Pain  HPI  Jeffery Sweeney is a 61 y.o. male with a past medical history of CVA, hypertension, hyperlipidemia, prior CAD NSTEMI presents to the emergency department for chest pain.  According to the patient came in with EMS from home for 8/10 midsternal chest pain.  Patient was given 2 sublingual nitroglycerin as well as aspirin states pain is minimal during my evaluation but still feels a slight discomfort in the center of his chest.  Patient denies any shortness of breath does state a slight cough denies any congestion.  Patient found to be hypoxic upon arrival 83% on room air with no O2 baseline requirement.  Patient placed on 4 L currently satting 95%.  Patient denies shortness of breath.  No pleuritic pain.  No leg pain or swelling.  Physical Exam   Triage Vital Signs: ED Triage Vitals  Enc Vitals Group     BP 12/28/21 1505 138/80     Pulse Rate 12/28/21 1505 (!) 103     Resp 12/28/21 1505 20     Temp 12/28/21 1505 (!) 100.5 F (38.1 C)     Temp Source 12/28/21 1505 Oral     SpO2 12/28/21 1505 (!) 83 %     Weight 12/28/21 1502 193 lb (87.5 kg)     Height 12/28/21 1502 5\' 3"  (1.6 m)     Head Circumference --      Peak Flow --      Pain Score 12/28/21 1502 8     Pain Loc --      Pain Edu? --      Excl. in GC? --     Most recent vital signs: Vitals:   12/28/21 1505 12/28/21 1654  BP: 138/80 121/69  Pulse: (!) 103 97  Resp: 20 10  Temp: (!) 100.5 F (38.1 C)   SpO2: (!) 83% 95%    General: Awake, no distress.  CV:  Good peripheral perfusion.  Regular rate and rhythm  Resp:  Normal effort.  Equal breath sounds bilaterally.  Abd:  No distention.  Soft, nontender.  No rebound or guarding. Other:  No lower extremity edema or tenderness to palpation.   ED Results / Procedures / Treatments    EKG  EKG viewed and interpreted by myself shows sinus tachycardia 104 bpm with a narrow QRS, normal axis, normal intervals, no concerning ST changes.  RADIOLOGY  I have reviewed and interpreted chest x-ray images.  I do not see any consolidation or obvious abnormality on my evaluation of the images. Radiology has read the x-ray as negative for acute abnormality.   MEDICATIONS ORDERED IN ED: Medications  iohexol (OMNIPAQUE) 350 MG/ML injection 75 mL (has no administration in time range)  acetaminophen (TYLENOL) tablet 1,000 mg (1,000 mg Oral Given 12/28/21 1656)  sodium chloride 0.9 % bolus 1,000 mL (1,000 mLs Intravenous New Bag/Given 12/28/21 1656)     IMPRESSION / MDM / ASSESSMENT AND PLAN / ED COURSE  I reviewed the triage vital signs and the nursing notes.  Patient's presentation is most consistent with acute presentation with potential threat to life or bodily function.  Patient presents to the emergency department for chest pain found to be hypoxic upon arrival.  Patient also found to be febrile to 100.5 but no known fever at home.  States  slight cough but no significant cough.  Denies any shortness of breath or pleuritic pain.  Patient is slightly tachycardic as well around 100 bpm.  Differential is quite broad but would include ACS, pulmonary embolism, pneumonia, COVID.  Patient's chest x-ray appears clear, lab work is largely nonrevealing with a normal CBC, normal chemistry and a negative troponin.  We will repeat a troponin.  We will obtain a COVID/flu test.  We will obtain a CTA of the chest to better evaluate for pneumonia as well as rule out pulmonary embolism.  Patient agreeable to plan of care.  We will treat with Tylenol and IV hydrate while awaiting further results.  Patient's COVID and flu test is negative.  Troponin is negative.  Chemistry and CBC are normal including a normal white blood cell count.  Patient CTA of the chest is negative for PE but does show bronchial  inflammation and air trapping, possibly indicating acute bronchitis.  Given the patient's air trapping we will dose to DuoNebs, given his hypoxia and CTA findings we will start the patient on Rocephin and Zithromax IV we will send blood cultures and admit to the hospital service for further work-up and treatment  CTA shows bronchial inflammation but no PE.  Given the patient's fever and initial tachycardia and hypoxia meeting sepsis criteria patient will be admitted to the hospital service for ongoing work-up and treatment.  CRITICAL CARE Performed by: Minna Antis   Total critical care time: 30 minutes  Critical care time was exclusive of separately billable procedures and treating other patients.  Critical care was necessary to treat or prevent imminent or life-threatening deterioration.  Critical care was time spent personally by me on the following activities: development of treatment plan with patient and/or surrogate as well as nursing, discussions with consultants, evaluation of patient's response to treatment, examination of patient, obtaining history from patient or surrogate, ordering and performing treatments and interventions, ordering and review of laboratory studies, ordering and review of radiographic studies, pulse oximetry and re-evaluation of patient's condition.   FINAL CLINICAL IMPRESSION(S) / ED DIAGNOSES   Chest pain Hypoxia Sepsis   Note:  This document was prepared using Dragon voice recognition software and may include unintentional dictation errors.   Minna Antis, MD 12/28/21 2252

## 2021-12-29 ENCOUNTER — Other Ambulatory Visit: Payer: Self-pay

## 2021-12-29 ENCOUNTER — Encounter: Payer: Self-pay | Admitting: Family Medicine

## 2021-12-29 DIAGNOSIS — I2510 Atherosclerotic heart disease of native coronary artery without angina pectoris: Secondary | ICD-10-CM

## 2021-12-29 DIAGNOSIS — R6520 Severe sepsis without septic shock: Secondary | ICD-10-CM

## 2021-12-29 DIAGNOSIS — A419 Sepsis, unspecified organism: Secondary | ICD-10-CM

## 2021-12-29 DIAGNOSIS — E785 Hyperlipidemia, unspecified: Secondary | ICD-10-CM

## 2021-12-29 DIAGNOSIS — B9689 Other specified bacterial agents as the cause of diseases classified elsewhere: Secondary | ICD-10-CM

## 2021-12-29 DIAGNOSIS — R7881 Bacteremia: Secondary | ICD-10-CM

## 2021-12-29 DIAGNOSIS — J189 Pneumonia, unspecified organism: Secondary | ICD-10-CM

## 2021-12-29 LAB — HIV ANTIBODY (ROUTINE TESTING W REFLEX): HIV Screen 4th Generation wRfx: NONREACTIVE

## 2021-12-29 LAB — BLOOD CULTURE ID PANEL (REFLEXED) - BCID2

## 2021-12-29 LAB — BASIC METABOLIC PANEL
Anion gap: 5 (ref 5–15)
BUN: 14 mg/dL (ref 8–23)
CO2: 25 mmol/L (ref 22–32)
Calcium: 7.9 mg/dL — ABNORMAL LOW (ref 8.9–10.3)
Chloride: 106 mmol/L (ref 98–111)
Creatinine, Ser: 0.97 mg/dL (ref 0.61–1.24)
GFR, Estimated: >60 mL/min (ref >60)
Glucose, Bld: 107 mg/dL — ABNORMAL HIGH (ref 70–99)
Potassium: 3.8 mmol/L (ref 3.5–5.1)
Sodium: 136 mmol/L (ref 135–145)

## 2021-12-29 LAB — LIPID PANEL
Cholesterol: 99 mg/dL (ref 0–200)
HDL: 34 mg/dL — ABNORMAL LOW (ref >40)
LDL Cholesterol: 46 mg/dL (ref 0–99)
Total CHOL/HDL Ratio: 2.9 RATIO
Triglycerides: 95 mg/dL (ref <150)
VLDL: 19 mg/dL (ref 0–40)

## 2021-12-29 LAB — CBC WITH DIFFERENTIAL/PLATELET
Abs Immature Granulocytes: 0.02 10*3/uL (ref 0.00–0.07)
Basophils Absolute: 0 10*3/uL (ref 0.0–0.1)
Basophils Relative: 0 %
Eosinophils Absolute: 0.2 10*3/uL (ref 0.0–0.5)
Eosinophils Relative: 2 %
HCT: 37.8 % — ABNORMAL LOW (ref 39.0–52.0)
Hemoglobin: 12.2 g/dL — ABNORMAL LOW (ref 13.0–17.0)
Immature Granulocytes: 0 %
Lymphocytes Relative: 13 %
Lymphs Abs: 1 10*3/uL (ref 0.7–4.0)
MCH: 28.2 pg (ref 26.0–34.0)
MCHC: 32.3 g/dL (ref 30.0–36.0)
MCV: 87.5 fL (ref 80.0–100.0)
Monocytes Absolute: 0.5 10*3/uL (ref 0.1–1.0)
Monocytes Relative: 7 %
Neutro Abs: 6.1 10*3/uL (ref 1.7–7.7)
Neutrophils Relative %: 78 %
Platelets: 172 10*3/uL (ref 150–400)
RBC: 4.32 MIL/uL (ref 4.22–5.81)
RDW: 12.9 % (ref 11.5–15.5)
WBC: 7.8 10*3/uL (ref 4.0–10.5)
nRBC: 0 % (ref 0.0–0.2)

## 2021-12-29 LAB — LACTIC ACID, PLASMA: Lactic Acid, Venous: 1.7 mmol/L (ref 0.5–1.9)

## 2021-12-29 LAB — MAGNESIUM: Magnesium: 2.1 mg/dL (ref 1.7–2.4)

## 2021-12-29 LAB — GLUCOSE, CAPILLARY: Glucose-Capillary: 112 mg/dL — ABNORMAL HIGH (ref 70–99)

## 2021-12-29 LAB — PROCALCITONIN: Procalcitonin: 14.07 ng/mL

## 2021-12-29 LAB — STREP PNEUMONIAE URINARY ANTIGEN: Strep Pneumo Urinary Antigen: NEGATIVE

## 2021-12-29 LAB — TROPONIN I (HIGH SENSITIVITY): Troponin I (High Sensitivity): 14 ng/L (ref <18)

## 2021-12-29 MED ORDER — NICOTINE 14 MG/24HR TD PT24
14.0000 mg | MEDICATED_PATCH | Freq: Every day | TRANSDERMAL | Status: DC
Start: 2021-12-29 — End: 2022-01-01
  Administered 2021-12-29 – 2022-01-01 (×4): 14 mg via TRANSDERMAL
  Filled 2021-12-29 (×4): qty 1

## 2021-12-29 MED ORDER — METOPROLOL TARTRATE 50 MG PO TABS
50.0000 mg | ORAL_TABLET | Freq: Two times a day (BID) | ORAL | Status: DC
  Administered 2021-12-29 – 2022-01-01 (×8): 50 mg via ORAL
  Filled 2021-12-29 (×8): qty 1

## 2021-12-29 MED ORDER — ASPIRIN 81 MG PO CHEW
81.0000 mg | CHEWABLE_TABLET | Freq: Every day | ORAL | Status: DC
  Administered 2021-12-29 – 2022-01-01 (×4): 81 mg via ORAL
  Filled 2021-12-29 (×4): qty 1

## 2021-12-29 MED ORDER — BENZONATATE 100 MG PO CAPS
100.0000 mg | ORAL_CAPSULE | Freq: Three times a day (TID) | ORAL | Status: DC | PRN
  Administered 2022-01-01: 100 mg via ORAL
  Filled 2021-12-29: qty 1

## 2021-12-29 MED ORDER — SODIUM CHLORIDE 0.9 % IV SOLN
2.0000 g | Freq: Three times a day (TID) | INTRAVENOUS | Status: DC
  Administered 2021-12-29 – 2022-01-01 (×9): 2 g via INTRAVENOUS
  Filled 2021-12-29 (×3): qty 12.5
  Filled 2021-12-29: qty 2
  Filled 2021-12-29 (×3): qty 12.5
  Filled 2021-12-29: qty 2
  Filled 2021-12-29 (×2): qty 12.5

## 2021-12-29 MED ORDER — ONDANSETRON HCL 4 MG/2ML IJ SOLN: 4.0000 mg | Freq: Four times a day (QID) | INTRAMUSCULAR | Status: DC | PRN

## 2021-12-29 MED ORDER — CEFTRIAXONE SODIUM 1 G IJ SOLR: 1.0000 g | INTRAMUSCULAR | Status: DC

## 2021-12-29 MED ORDER — PROMETHAZINE HCL 25 MG/ML IJ SOLN: 12.5000 mg | Freq: Once | INTRAMUSCULAR | Status: DC

## 2021-12-29 MED ORDER — PNEUMOCOCCAL 20-VAL CONJ VACC 0.5 ML IM SUSY
0.5000 mL | PREFILLED_SYRINGE | INTRAMUSCULAR | Status: DC
  Filled 2021-12-29 (×3): qty 0.5

## 2021-12-29 MED ORDER — IPRATROPIUM-ALBUTEROL 0.5-2.5 (3) MG/3ML IN SOLN
3.0000 mL | Freq: Four times a day (QID) | RESPIRATORY_TRACT | Status: DC | PRN
  Administered 2021-12-29 (×3): 3 mL via RESPIRATORY_TRACT
  Filled 2021-12-29 (×3): qty 3

## 2021-12-29 MED ORDER — SODIUM CHLORIDE 0.9 % IV BOLUS
1000.0000 mL | Freq: Once | INTRAVENOUS | Status: AC
  Administered 2021-12-29: 1000 mL via INTRAVENOUS

## 2021-12-29 MED ORDER — PANTOPRAZOLE SODIUM 40 MG PO TBEC
40.0000 mg | DELAYED_RELEASE_TABLET | Freq: Every day | ORAL | Status: DC
  Administered 2021-12-29 – 2022-01-01 (×4): 40 mg via ORAL
  Filled 2021-12-29 (×4): qty 1

## 2021-12-29 MED ORDER — ROSUVASTATIN CALCIUM 10 MG PO TABS
40.0000 mg | ORAL_TABLET | Freq: Every day | ORAL | Status: DC
  Administered 2021-12-29 – 2022-01-01 (×4): 40 mg via ORAL
  Filled 2021-12-29 (×3): qty 4
  Filled 2021-12-29: qty 2
  Filled 2021-12-29: qty 4

## 2021-12-29 MED ORDER — TICAGRELOR 90 MG PO TABS
90.0000 mg | ORAL_TABLET | Freq: Two times a day (BID) | ORAL | Status: DC
  Administered 2021-12-29 – 2022-01-01 (×8): 90 mg via ORAL
  Filled 2021-12-29 (×8): qty 1

## 2021-12-29 MED ORDER — NITROGLYCERIN 0.4 MG SL SUBL
0.4000 mg | SUBLINGUAL_TABLET | SUBLINGUAL | Status: DC | PRN
Start: 2021-12-29 — End: 2022-01-01
  Administered 2021-12-29: 0.4 mg via SUBLINGUAL
  Filled 2021-12-29: qty 1

## 2021-12-29 MED ORDER — HEPARIN SODIUM (PORCINE) 5000 UNIT/ML IJ SOLN
5000.0000 [IU] | Freq: Three times a day (TID) | INTRAMUSCULAR | Status: DC
  Administered 2021-12-29 – 2022-01-01 (×10): 5000 [IU] via SUBCUTANEOUS
  Filled 2021-12-29 (×10): qty 1

## 2021-12-29 MED ORDER — SODIUM CHLORIDE 0.9 % IV SOLN
1.0000 g | INTRAVENOUS | Status: DC
  Filled 2021-12-29: qty 10

## 2021-12-29 MED ORDER — HEPARIN SODIUM (PORCINE) 5000 UNIT/ML IJ SOLN: 5000.0000 [IU] | Freq: Three times a day (TID) | INTRAMUSCULAR | Status: DC

## 2021-12-29 MED ORDER — MORPHINE SULFATE (PF) 2 MG/ML IV SOLN
2.0000 mg | Freq: Once | INTRAVENOUS | Status: AC
  Administered 2021-12-29: 2 mg via INTRAVENOUS
  Filled 2021-12-29: qty 1

## 2021-12-29 MED ORDER — MECLIZINE HCL 25 MG PO TABS: 25.0000 mg | ORAL_TABLET | Freq: Three times a day (TID) | ORAL | Status: DC | PRN

## 2021-12-29 MED ORDER — ORAL CARE MOUTH RINSE: 15.0000 mL | OROMUCOSAL | Status: DC | PRN

## 2021-12-29 MED ORDER — FAMOTIDINE 20 MG PO TABS
20.0000 mg | ORAL_TABLET | Freq: Two times a day (BID) | ORAL | Status: DC | PRN
Start: 2021-12-29 — End: 2022-01-01

## 2021-12-29 MED ORDER — CALCIUM CARBONATE ANTACID 500 MG PO CHEW
1.0000 | CHEWABLE_TABLET | Freq: Two times a day (BID) | ORAL | Status: DC | PRN
  Administered 2021-12-29: 200 mg via ORAL
  Filled 2021-12-29: qty 1

## 2021-12-29 MED ORDER — ALPRAZOLAM 0.25 MG PO TABS
0.2500 mg | ORAL_TABLET | Freq: Two times a day (BID) | ORAL | Status: DC | PRN
  Administered 2021-12-29 – 2022-01-01 (×4): 0.25 mg via ORAL
  Filled 2021-12-29 (×4): qty 1

## 2021-12-29 MED ORDER — SODIUM CHLORIDE 0.9 % IV SOLN
500.0000 mg | INTRAVENOUS | Status: DC
  Administered 2021-12-29: 500 mg via INTRAVENOUS
  Filled 2021-12-29: qty 5

## 2021-12-29 MED ORDER — ACETAMINOPHEN 325 MG PO TABS
650.0000 mg | ORAL_TABLET | ORAL | Status: DC | PRN
Start: 2021-12-29 — End: 2022-01-01
  Administered 2021-12-29 – 2021-12-30 (×3): 650 mg via ORAL
  Filled 2021-12-29 (×3): qty 2

## 2021-12-29 MED ORDER — PROMETHAZINE HCL 25 MG/ML IJ SOLN
12.5000 mg | Freq: Once | INTRAMUSCULAR | Status: DC
  Filled 2021-12-29: qty 1

## 2021-12-29 MED ORDER — SODIUM CHLORIDE 0.9 % IV SOLN: 500.0000 mg | INTRAVENOUS | Status: DC

## 2021-12-29 NOTE — ED Notes (Signed)
Advised nurse that patient has ready bed 

## 2021-12-29 NOTE — Assessment & Plan Note (Signed)
On Repatha - Continue home Crestor

## 2021-12-29 NOTE — ED Notes (Signed)
Pt reports 6/10 central chest tightness. Nitro given at this time.

## 2021-12-29 NOTE — Progress Notes (Signed)
Elink following for sepsis protocol. 

## 2021-12-29 NOTE — Progress Notes (Signed)
   12/29/21 1532  Assess: MEWS Score  BP (!) 167/96  MAP (mmHg) 112  Pulse Rate (!) 119  Resp (!) 24  Assess: MEWS Score  MEWS Temp 0  MEWS Systolic 0  MEWS Pulse 2  MEWS RR 1  MEWS LOC 0  MEWS Score 3  MEWS Score Color Yellow  Assess: if the MEWS score is Yellow or Red  Were vital signs taken at a resting state? No  Focused Assessment Change from prior assessment (see assessment flowsheet)  Does the patient meet 2 or more of the SIRS criteria? Yes  Does the patient have a confirmed or suspected source of infection? No  MEWS guidelines implemented *See Row Information* Yes  Treat  MEWS Interventions Administered prn meds/treatments;Escalated (See documentation below);Consulted Respiratory Therapy  Take Vital Signs  Increase Vital Sign Frequency  Yellow: Q 2hr X 2 then Q 4hr X 2, if remains yellow, continue Q 4hrs  Escalate  MEWS: Escalate Yellow: discuss with charge nurse/RN and consider discussing with provider and RRT  Notify: Charge Nurse/RN  Name of Charge Nurse/RN Notified ashly  Date Charge Nurse/RN Notified 12/29/21  Time Charge Nurse/RN Notified 1530  Notify: Provider  Provider Name/Title danford  Date Provider Notified 12/29/21  Time Provider Notified 1530  Method of Notification Page;Call  Notification Reason Change in status  Provider response At bedside  Date of Provider Response 12/29/21  Time of Provider Response 1542  Document  Progress note created (see row info) Yes  Assess: SIRS CRITERIA  SIRS Temperature  0  SIRS Pulse 1  SIRS Respirations  1  SIRS WBC 1  SIRS Score Sum  3   Walked into room, patient lying in be shivering, unable to catch breath. Vitals sign taken, patient placed on 02 at 2L, SAT was 96 on ra, resp. 28. Respiratory called, charge nurse called. Temp 99.9 oral. Dr. Maryfrances Bunnell called. Once md on floor, patient respiratory status improving, no longer shivering. Per md order EKG, give 1L bolus. Patient become nauseated, per md order prn  phenergan. Patient was placed back on telemetry reading st 120-130.

## 2021-12-29 NOTE — Progress Notes (Signed)
   12/29/21 1640  Assess: MEWS Score  Temp 99.1 F (37.3 C)  BP (!) 111/54  MAP (mmHg) 69  Pulse Rate (!) 118  Resp 19  SpO2 96 %  O2 Device Room Air  Assess: MEWS Score  MEWS Temp 0  MEWS Systolic 0  MEWS Pulse 2  MEWS RR 0  MEWS LOC 0  MEWS Score 2  MEWS Score Color Yellow  Document  Patient Outcome Stabilized after interventions  Progress note created (see row info) Yes  Assess: SIRS CRITERIA  SIRS Temperature  0  SIRS Pulse 1  SIRS Respirations  0  SIRS WBC 1  SIRS Score Sum  2   Patient resting in bed, drowsy however responsive, no longer nauseated. Phenergan was not give, will hold at this time. Bolus infusing scheduled iv abx infusing. Vital signs improving. Will continue to monitor closely

## 2021-12-29 NOTE — Progress Notes (Signed)
  Progress Note   Patient: Jeffery Sweeney WLN:[id number] DOB: 08-11-1960 DOA: 12/28/2021     1 DOS: the patient was seen and examined on 12/29/2021       Brief hospital course: Jeffery Sweeney is a 61 y.o. M with hx CAD s/p CABG 2021, HTN, HLD and hx CVA who presented with chest pain, and shortness of breath.    In the ER, hypoxic to 83% required 4L o2.  ECG and troponins normal but CXR and follow up CTA chest showed bilateral lower lobe pneumonia. Procal 14.  Admitted on antibiotics for pneumonia.     Assessment and Plan: * Severe sepsis (HCC)    Multifocal pneumonia    Acute respiratory failure with hypoxia (HCC)    Bacteremia due to Enterobacter species Patient presented with fever, tachycardia, respiratory failure, and found to have Enterobacter bacteremia.  Source suspected is lung/pneumonia.  Lesser suspicion for cholangitis or cholecystitis given normal abdominal exam.    - Continue antibiotics, change to Cefepime - Follow blood culture sensitivities - Obtain urine and sputum cultures - Obtain HIDA -Transfer to progressive care - Bolus fluids  Hyperlipidemia LDL goal <70 On Repatha - Continue home Crestor  CAD, multiple vessel - Continue aspirin, Crestor - Continue home metoprolol  Hypertension Blood pressure elevated - Continue home metoprolol          Subjective: Having nausea and rigors this afternoon, tachycardia and malaise.     Physical Exam: Vitals:   12/29/21 1301 12/29/21 1530 12/29/21 1532 12/29/21 1600  BP: 131/73  (!) 167/96 137/74  Pulse: 92  (!) 119 (!) 132  Resp: 16  (!) 24 (!) 21  Temp: 99.7 F (37.6 C) 99.9 F (37.7 C)    TempSrc: Oral Oral    SpO2: 99% 96%  95%  Weight:      Height:       Elderly adult male, lying in bed, appears uncomfortable and tired Tachycardic, regular, no murmurs that I can appreciate, 1+ lower extremity edema Respiratory rate normal, lungs clear without rales or wheezes Abdomen soft, absolutely  no tenderness in all quadrants, no rigidity, no guarding, no rebound. Attention slightly distracted, affect blunted, judgment and insight appear normal, face symmetric, speech fluent   Data Reviewed: Procalcitonin 14 COVID-negative Basic metabolic panel normal Troponin negative Complete blood count normal Chest x-ray shows pneumonia CT angiogram of the chest showed no pulmonary embolism current dose of bilateral pulmonary infiltrates     Family Communication: son by phone    Disposition: Status is: Inpatient Patient admitted for pneumonia found to have sepsis with enterobacter cloacae bacteremia.  Escalating antibiotics and transferring to progressive.          Author: Alberteen Sam, MD 12/29/2021 4:21 PM  For on call review [email or web address].

## 2021-12-29 NOTE — Progress Notes (Signed)
CODE SEPSIS - PHARMACY COMMUNICATION  **Broad Spectrum Antibiotics should be administered within 1 hour of Sepsis diagnosis**  Time Code Sepsis Called/Page Received: 1722  Antibiotics Ordered: cefepime, azith  Time of 1st antibiotic administration: 1615  Additional action taken by pharmacy:    If necessary, Name of Provider/Nurse Contacted:      Angelique Blonder ,PharmD Clinical Pharmacist  12/29/2021  5:33 PM

## 2021-12-29 NOTE — Progress Notes (Addendum)
Made Dr. Maryfrances Bunnell aware current temp 101.1 oral. Blood culture had already been drawn. Prn tylenol given. Scheduled iv abx already given. Lactic level ordered by md. Will continue to monitor closely

## 2021-12-29 NOTE — Assessment & Plan Note (Signed)
-   Continue aspirin, Crestor - Continue home metoprolol

## 2021-12-29 NOTE — ED Notes (Signed)
Chest tightness continues

## 2021-12-29 NOTE — Progress Notes (Addendum)
PHARMACY - PHYSICIAN COMMUNICATION CRITICAL VALUE ALERT - BLOOD CULTURE IDENTIFICATION (BCID)  Jeffery Sweeney is an 61 y.o. male with PMH CAD s/p MI, CVA, HTN, and HLD presented to [facility] on 12/28/2021 with a chief complaint of chest pain. Pt found to have acute hypoxic respiratory failure 2/2 CAP vs. COPD as well as GNR bacteremia. CXR shows no focal consolidation.  Assessment:  1 of 4 bottles, anaerobic bottle, growing Enterobacter cloacae complex. No resistance genes detected.  Name of physician (or Provider) Contacted: [name], MD  Current antibiotics: Ceftriaxone 1 g IV q24H, Metronidazole 500 mg IV q24H  Changes to prescribed antibiotics recommended:  Change ceftriaxone to cefepime 2 g IV q8H and keep azithromycin. Recommendations accepted by provider.  Results for orders placed or performed during the hospital encounter of 12/28/21  Blood Culture ID Panel (Reflexed) (Collected: 12/28/2021  7:30 PM)  Result Value Ref Range   Enterococcus faecalis NOT DETECTED NOT DETECTED   Enterococcus Faecium NOT DETECTED NOT DETECTED   Listeria monocytogenes NOT DETECTED NOT DETECTED   Staphylococcus species NOT DETECTED NOT DETECTED   Staphylococcus aureus (BCID) NOT DETECTED NOT DETECTED   Staphylococcus epidermidis NOT DETECTED NOT DETECTED   Staphylococcus lugdunensis NOT DETECTED NOT DETECTED   Streptococcus species NOT DETECTED NOT DETECTED   Streptococcus agalactiae NOT DETECTED NOT DETECTED   Streptococcus pneumoniae NOT DETECTED NOT DETECTED   Streptococcus pyogenes NOT DETECTED NOT DETECTED   A.calcoaceticus-baumannii NOT DETECTED NOT DETECTED   Bacteroides fragilis NOT DETECTED NOT DETECTED   Enterobacterales DETECTED (A) NOT DETECTED   Enterobacter cloacae complex DETECTED (A) NOT DETECTED   Escherichia coli NOT DETECTED NOT DETECTED   Klebsiella aerogenes NOT DETECTED NOT DETECTED   Klebsiella oxytoca NOT DETECTED NOT DETECTED   Klebsiella pneumoniae NOT  DETECTED NOT DETECTED   Proteus species NOT DETECTED NOT DETECTED   Salmonella species NOT DETECTED NOT DETECTED   Serratia marcescens NOT DETECTED NOT DETECTED   Haemophilus influenzae NOT DETECTED NOT DETECTED   Neisseria meningitidis NOT DETECTED NOT DETECTED   Pseudomonas aeruginosa NOT DETECTED NOT DETECTED   Stenotrophomonas maltophilia NOT DETECTED NOT DETECTED   Candida albicans NOT DETECTED NOT DETECTED   Candida auris NOT DETECTED NOT DETECTED   Candida glabrata NOT DETECTED NOT DETECTED   Candida krusei NOT DETECTED NOT DETECTED   Candida parapsilosis NOT DETECTED NOT DETECTED   Candida tropicalis NOT DETECTED NOT DETECTED   Cryptococcus neoformans/gattii NOT DETECTED NOT DETECTED   CTX-M ESBL NOT DETECTED NOT DETECTED   Carbapenem resistance IMP NOT DETECTED NOT DETECTED   Carbapenem resistance KPC NOT DETECTED NOT DETECTED   Carbapenem resistance NDM NOT DETECTED NOT DETECTED   Carbapenem resist OXA 48 LIKE NOT DETECTED NOT DETECTED   Carbapenem resistance VIM NOT DETECTED NOT DETECTED    Terie Purser, PharmD PGY-1 Pharmacy Resident 12/29/2021 2:46 PM

## 2021-12-29 NOTE — Assessment & Plan Note (Addendum)
Blood pressure normal - Continue home metoprolol

## 2021-12-29 NOTE — Hospital Course (Signed)
Jeffery Sweeney is a 61 y.o. M with hx CAD s/p CABG 2021, HTN, HLD and hx CVA who presented with chest pain, and shortness of breath.    In the ER, hypoxic to 83% required 4L o2.  ECG and troponins normal but CXR and follow up CTA chest showed bilateral lower lobe pneumonia. Procal 14.  Admitted on antibiotics for pneumonia.

## 2021-12-30 ENCOUNTER — Inpatient Hospital Stay: Payer: Self-pay

## 2021-12-30 ENCOUNTER — Encounter: Payer: Self-pay | Admitting: Family Medicine

## 2021-12-30 DIAGNOSIS — E669 Obesity, unspecified: Secondary | ICD-10-CM

## 2021-12-30 DIAGNOSIS — K810 Acute cholecystitis: Secondary | ICD-10-CM

## 2021-12-30 LAB — BASIC METABOLIC PANEL
Anion gap: 4 — ABNORMAL LOW (ref 5–15)
BUN: 13 mg/dL (ref 8–23)
CO2: 23 mmol/L (ref 22–32)
Calcium: 8.3 mg/dL — ABNORMAL LOW (ref 8.9–10.3)
Chloride: 111 mmol/L (ref 98–111)
Creatinine, Ser: 0.83 mg/dL (ref 0.61–1.24)
GFR, Estimated: >60 mL/min (ref >60)
Glucose, Bld: 93 mg/dL (ref 70–99)
Potassium: 3.7 mmol/L (ref 3.5–5.1)
Sodium: 138 mmol/L (ref 135–145)

## 2021-12-30 LAB — HEPATIC FUNCTION PANEL
ALT: 107 U/L — ABNORMAL HIGH (ref 0–44)
AST: 103 U/L — ABNORMAL HIGH (ref 15–41)
Albumin: 2.9 g/dL — ABNORMAL LOW (ref 3.5–5.0)
Alkaline Phosphatase: 128 U/L — ABNORMAL HIGH (ref 38–126)
Bilirubin, Direct: 1.5 mg/dL — ABNORMAL HIGH (ref 0.0–0.2)
Indirect Bilirubin: 1.8 mg/dL — ABNORMAL HIGH (ref 0.3–0.9)
Total Bilirubin: 3.3 mg/dL — ABNORMAL HIGH (ref 0.3–1.2)
Total Protein: 6.6 g/dL (ref 6.5–8.1)

## 2021-12-30 LAB — CBC
HCT: 36.1 % — ABNORMAL LOW (ref 39.0–52.0)
Hemoglobin: 12 g/dL — ABNORMAL LOW (ref 13.0–17.0)
MCH: 28.4 pg (ref 26.0–34.0)
MCHC: 33.2 g/dL (ref 30.0–36.0)
MCV: 85.5 fL (ref 80.0–100.0)
Platelets: 156 10*3/uL (ref 150–400)
RBC: 4.22 MIL/uL (ref 4.22–5.81)
RDW: 12.8 % (ref 11.5–15.5)
WBC: 7 10*3/uL (ref 4.0–10.5)
nRBC: 0 % (ref 0.0–0.2)

## 2021-12-30 LAB — LEGIONELLA PNEUMOPHILA SEROGP 1 UR AG: L. pneumophila Serogp 1 Ur Ag: NEGATIVE

## 2021-12-30 LAB — URINE CULTURE: Culture: NO GROWTH

## 2021-12-30 MED ORDER — MORPHINE SULFATE (PF) 4 MG/ML IV SOLN
INTRAVENOUS | Status: AC
  Filled 2021-12-30: qty 1

## 2021-12-30 MED ORDER — TECHNETIUM TC 99M MEBROFENIN IV KIT
7.5000 | PACK | Freq: Once | INTRAVENOUS | Status: AC
  Administered 2021-12-30: 7.69 via INTRAVENOUS

## 2021-12-30 NOTE — Assessment & Plan Note (Signed)
BMI 34 

## 2021-12-30 NOTE — TOC Initial Note (Signed)
Transition of Care [facility]) - Initial/Assessment Note    Patient Details  Name: Jeffery Sweeney MRN: [id number] Date of Birth: 01-06-1961  Transition of Care [facility]) CM/SW Contact:    Candie Chroman, LCSW Phone Number: 12/30/2021, 11:36 AM  Clinical Narrative:  CSW met with patient. No supports at bedside. CSW introduced role and inquired about patient not having a PCP which he confirmed. He also confirmed he does not have insurance "yet." Gave packet for free/low-cost healthcare in [facility] and intake paperwork for [facility] in case he does not have insurance for a while. His car is at his house so he said he will either have a friend pick him up or take a cab home. No further concerns. CSW encouraged patient to contact CSW as needed. CSW will continue to follow patient for support and facilitate return home when stable.                Expected Discharge Plan: Home/Self Care Barriers to Discharge: Continued Medical Work up   Patient Goals and CMS Choice        Expected Discharge Plan and Services Expected Discharge Plan: Home/Self Care       Living arrangements for the past 2 months: Single Family Home                                      Prior Living Arrangements/Services Living arrangements for the past 2 months: Single Family Home   Patient language and need for interpreter reviewed:: Yes Do you feel safe going back to the place where you live?: Yes      Need for Family Participation in Patient Care: Yes (Comment)     Criminal Activity/Legal Involvement Pertinent to Current Situation/Hospitalization: No - Comment as needed  Activities of Daily Living Home Assistive Devices/Equipment: Walker (specify type) ADL Screening (condition at time of admission) Patient's cognitive ability adequate to safely complete daily activities?: Yes Is the patient deaf or have difficulty hearing?: No Does the patient have difficulty seeing, even when wearing  glasses/contacts?: No Does the patient have difficulty concentrating, remembering, or making decisions?: No Patient able to express need for assistance with ADLs?: Yes Does the patient have difficulty dressing or bathing?: No Independently performs ADLs?: Yes (appropriate for developmental age) Does the patient have difficulty walking or climbing stairs?: No Weakness of Legs: None Weakness of Arms/Hands: None  Permission Sought/Granted                  Emotional Assessment Appearance:: Appears stated age Attitude/Demeanor/Rapport: Engaged, Gracious Affect (typically observed): Accepting, Appropriate, Calm, Pleasant Orientation: : Oriented to Self, Oriented to Place, Oriented to  Time, Oriented to Situation Alcohol / Substance Use: Not Applicable Psych Involvement: No (comment)  Admission diagnosis:  Bronchitis [J40] Hypoxia [R09.02] Chest pain, rule out acute myocardial infarction [R07.9] Sepsis, due to unspecified organism, unspecified whether acute organ dysfunction present [facility]) [A41.9] Patient Active Problem List   Diagnosis Date Noted   Obesity (BMI 30-39.9) 12/30/2021   Multifocal pneumonia 12/29/2021   Severe sepsis ([place]) 12/29/2021   Bacteremia due to Enterobacter species 12/29/2021   Acute respiratory failure with hypoxia (HCC) 12/28/2021   Hyperlipidemia LDL goal <70 09/26/2021   Stroke (cerebrum) (HCC) 07/05/2021   Vertigo    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   Chest pain 07/06/2020   CAD, multiple vessel 07/06/2020   3-vessel CAD 07/06/2020  Hypertension    Tobacco abuse    Non-ST elevation (NSTEMI) myocardial infarction [facility])    Unstable angina ([place]) 07/05/2020   PCP:  Wellington Hampshire, MD Pharmacy:   [facility] [address] ([address]), [place] [address] [place]) [place] [address] Phone: [phone] Fax: [phone]  [place] [address] Phone:  [phone] Fax: [phone]     Social Determinants of Health (SDOH) Interventions    Readmission Risk Interventions     No data to display

## 2021-12-30 NOTE — Progress Notes (Addendum)
  Progress Note   Patient: Jeffery Sweeney:[id number] DOB: [birth year] DOA: 12/28/2021     2 DOS: the patient was seen and examined on 12/30/2021 at 14:30      Brief hospital course: Mr. Ottaway is a 61 y.o. M with hx CAD s/p CABG 2021, HTN, HLD and hx CVA who presented with chest pain, and shortness of breath.    In the ER, hypoxic to 83% required 4L o2.  ECG and troponins normal but CXR and follow up CTA chest showed bilateral lower lobe pneumonia. Procal 14.  Admitted on antibiotics for pneumonia.     Assessment and Plan: * Severe sepsis due to Bacteremia due to Enterobacter species due to Multifocal pneumonia versus Possible acute cholecystitis? Clinically unclear at this time Acute respiratory failure with hypoxia [facility])  Patient presented with fever, tachycardia, respiratory failure, and found to have Enterobacter bacteremia.  Source somewhat unclear.  Pneumonia on CT is atypical in distribution.  LFTs up today and HIDA equivocal.    - Continue cefepime - Follow blood culture sensitivities - Follow urine and sputum cultures - Follow final HIDA report  - Consult Gen Surg, appreciate expertise - Trend LFTs - If LFTs still trending up, will pursue MRCP and repeat US      Obesity (BMI 30-39.9) BMI 34  Hyperlipidemia LDL goal <70 On Repatha - Continue home Crestor  CAD, multiple vessel - Continue aspirin, Crestor - Continue home metoprolol  Hypertension Blood pressure normal - Continue home metoprolol          Subjective: Patient feeling somewhat better.  No abdominal pain, no vomiting.  Appetite okay.  Had fever overnight, malaise and tachycardia, but this is somewhat better today.  No new cough, no dysuria.     Physical Exam: Vitals:   12/30/21 0027 12/30/21 0523 12/30/21 0741 12/30/21 1128  BP:  139/80 (!) 147/86 (!) 150/86  Pulse:  75 84 83  Resp:  14 16 18   Temp: 98.2 F (36.8 C) 98.2 F (36.8 C) 98.2 F (36.8 C) 98.2 F (36.8 C)   TempSrc: Oral Oral    SpO2:  100% 99% 99%  Weight:      Height:       Obese adult male, lying in bed, watching television RRR, no murmurs, no peripheral edema, old midline sternotomy scar Respiratory rate normal, crackles at bilateral bases, very faint, no wheezing Attention normal, affect normal, judgment insight appear normal He is no pain in the left side, no guarding at all in all quadrants, maybe the slightest bit of tenderness to deep palpation of the right, although this is very subtle and I may be over interpreting, no rigidity, no guarding, negative Murphy sign       Data Reviewed: Discussed with general surgery LFTs, total bilirubin 3.3 White blood cell count unchanged, normal, hemoglobin normal Basic metabolic panel normal     Disposition: Status is: Inpatient The patient was admitted with sepsis.  He seems to be improving although his LFTs have gone up and I am suspecting that he may have cholecystitis.  We will follow the HIDA scan report and consult general surgery today          Author: , MD 12/30/2021 3:54 PM  For on call review [email or web address].

## 2021-12-30 NOTE — Assessment & Plan Note (Addendum)
Patient presented with fever, tachycardia, respiratory failure, and found to have Enterobacter bacteremia.  Source somewhat unclear.  Pneumonia on CT is atypical in distribution.  LFTs up today and HIDA abnormal.    - Continue cefepime - Follow blood culture sensitivities - Follow urine and sputum cultures - Follow final HIDA report  - Consult Gen Surg - Trend LFTs

## 2021-12-30 NOTE — Progress Notes (Signed)
Mobility Specialist - Progress Note    12/30/21 1533  Mobility  Activity Ambulated independently in hallway;Stood at bedside;Dangled on edge of bed  Level of Assistance Independent  Assistive Device None  Distance Ambulated (ft) 160 ft  Activity Response Tolerated well  $Mobility charge 1 Mobility   Pt semi-supine in bed on RA upon arrival. Pt STS and ambulates indep. Pt returns to bed with needs in reach.   Terrilyn Saver  Mobility Specialist  12/30/21 3:34 PM

## 2021-12-30 NOTE — Progress Notes (Signed)
Mobility Specialist - Progress Note    12/30/21 0900  Mobility  Activity Ambulated independently in hallway  Level of Assistance Independent  Distance Ambulated (ft) 120 ft  Activity Response Tolerated well  $Mobility charge 1 Mobility    Pt supine in bed on RA upon arrival. Pt STS and ambulates in hallway indep. Pt returns to bed with needs in reach and bed alarm set.  Terrilyn Saver  Mobility Specialist  12/30/21 9:58 AM

## 2021-12-30 NOTE — Consult Note (Signed)
SURGICAL CONSULTATION NOTE   HISTORY OF PRESENT ILLNESS (HPI):  61 y.o. male presented to [facility] ED for evaluation of chest pain. Patient reports he started having midsternal chest pain seems 2 days ago.  Pain initially was severe but he has been getting better with pain medication and antibiotic therapy.  Patient endorses having difficulty breathing associated to the pain.  He denies any nausea or vomiting.  On the ED he was found with hypoxia and fever.  His hemoglobin on admission was close to baseline.  There was no elevated white blood cell count.  CTA of the chest shows no sign of pulmonary embolism.  Due to the fever and hypoxia and complaining of shortness of breath and cough he was admitted with a diagnosis of atypical pneumonia.  During the admission patient started complaining of epigastric pain.  CT scan of the abdomen and pelvis shows mild pericholecystic fluid.  This led to right upper quadrant ultrasound.  The right upper quadrant ultrasound shows no cholelithiasis with dense bile suspected sludge.  There is no gallbladder wall thickening.  There was negative sonographic Murphy sign.  There was still present small amount of pericholecystic fluid.  Since abdominal ultrasound was a cubicle for cholecystitis HIDA scan was recommended.  HIDA scan was done today.  I personally evaluated the images of the CT scan of the abdomen and pelvis, abdominal ultrasound and HIDA scan.  I personally discussed the HIDA scan images with interventional radiologist who discussed the images with nuclear medicine radiologist.  Primary discussion was that there was delay visualization of the gallbladder with patent cystic duct.  This can be seen with chronic cholecystitis.  He was also found with gram-negative rods on blood culture.  His bilirubin increased today.  Surgery is consulted by Dr. Maryfrances Bunnell in this context for evaluation and management of suspected cholecystitis.  PAST MEDICAL HISTORY (PMH):  Past  Medical History:  Diagnosis Date   CVA (cerebral vascular accident) (HCC) 06/2021   Small acute/subacute infarcts in the left occipital lobe and   Hyperlipidemia LDL goal <70    started on Repatha 09/26/2021, continue Crestor 40mg  daily   Hypertension    Multiple vessel coronary artery disease    Myocardial infarct (HCC)    x 3; PCI with single stent placement for each (in )   NSTEMI (non-ST elevated myocardial infarction) (HCC)    LHC 2/18 with multivessel CAD and transfer to [facility] for CABG eval   STEMI (ST elevation myocardial infarction) (HCC) 09/23/2021   LHC 09/23/2021 x 2   Tobacco use    2 cigarettes daily   Unstable angina (HCC)    LHC 09/23/2021 x 2; total 8 DES to LIMA     PAST SURGICAL HISTORY (PSH):  Past Surgical History:  Procedure Laterality Date   CORONARY ANGIOPLASTY WITH STENT PLACEMENT     in 11/23/2021 in 2018-19   CORONARY ARTERY BYPASS GRAFT N/A 07/09/2020   Procedure: CORONARY ARTERY BYPASS GRAFTING (CABG) x THREE , USING LEFT INTERNAL MAMMARY ARTERY, AND RIGHT LEG GREATER SAPHENOUS VEIN HARVESTED ENDOSCOPICALLY;  Surgeon: 07/11/2020, MD;  Location: [facility] OR;  Service: Open Heart Surgery;  Laterality: N/A;   CORONARY STENT INTERVENTION N/A 09/23/2021   Procedure: CORONARY STENT INTERVENTION;  Surgeon: 11/23/2021, MD;  Location: [facility] INVASIVE CV LAB;  Service: Cardiovascular;  Laterality: N/A;   CORONARY/GRAFT ACUTE MI REVASCULARIZATION N/A 09/23/2021   Procedure: Coronary/Graft Acute MI Revascularization;  Surgeon: 11/23/2021, MD;  Location: [facility] INVASIVE CV LAB;  Service: Cardiovascular;  Laterality: N/A;   INTRAVASCULAR PRESSURE WIRE/FFR STUDY N/A 07/06/2020   Procedure: INTRAVASCULAR PRESSURE WIRE/FFR STUDY;  Surgeon: Iran Ouch, MD;  Location: [facility] INVASIVE CV LAB;  Service: Cardiovascular;  Laterality: N/A;  LAD   LEFT HEART CATH AND CORONARY ANGIOGRAPHY N/A 07/06/2020   Procedure: LEFT HEART CATH AND CORONARY ANGIOGRAPHY;  Surgeon:  Iran Ouch, MD;  Location: [facility] INVASIVE CV LAB;  Service: Cardiovascular;  Laterality: N/A;   LEFT HEART CATH AND CORONARY ANGIOGRAPHY N/A 09/23/2021   Procedure: LEFT HEART CATH AND CORONARY ANGIOGRAPHY;  Surgeon: Iran Ouch, MD;  Location: [facility] INVASIVE CV LAB;  Service: Cardiovascular;  Laterality: N/A;   LEFT HEART CATH AND CORS/GRAFTS ANGIOGRAPHY N/A 09/23/2021   Procedure: LEFT HEART CATH AND CORS/GRAFTS ANGIOGRAPHY;  Surgeon: Iran Ouch, MD;  Location: [facility] INVASIVE CV LAB;  Service: Cardiovascular;  Laterality: N/A;   TEE WITHOUT CARDIOVERSION N/A 07/09/2020   Procedure: TRANSESOPHAGEAL ECHOCARDIOGRAM (TEE);  Surgeon: Delight Ovens, MD;  Location: [facility] OR;  Service: Open Heart Surgery;  Laterality: N/A;   WRIST SURGERY Left    for cyst     MEDICATIONS:  Prior to Admission medications   Medication Sig Start Date End Date Taking? Authorizing Provider  aspirin 81 MG chewable tablet Chew 1 tablet (81 mg total) by mouth daily. 09/27/21  Yes Hammock, Sheri, NP  Evolocumab (REPATHA SURECLICK) 140 MG/ML SOAJ Inject 140 mg into the skin every 14 (fourteen) days. 09/25/21  Yes Debbe Odea, MD  metoprolol tartrate (LOPRESSOR) 50 MG tablet Take 1 tablet (50 mg total) by mouth 2 (two) times daily. 10/11/21  Yes Furth, Cadence H, PA-C  ondansetron (ZOFRAN-ODT) 4 MG disintegrating tablet Take 1 tablet (4 mg total) by mouth every 8 (eight) hours as needed for up to 10 days for nausea or vomiting. 12/27/21 01/06/22 Yes Varney Daily, PA  oxyCODONE-acetaminophen (PERCOCET/ROXICET) 5-325 MG tablet Take 1 tablet by mouth every 6 (six) hours as needed for up to 7 days for severe pain. 12/27/21 01/03/22 Yes Varney Daily, PA  pantoprazole (PROTONIX) 40 MG tablet Take 1 tablet (40 mg total) by mouth once daily. 10/09/21 01/07/22 Yes Furth, Cadence H, PA-C  rosuvastatin (CRESTOR) 40 MG tablet Take 1 tablet (40 mg total) by mouth once daily. 10/09/21 01/07/22 Yes Furth, Cadence H,  PA-C  ticagrelor (BRILINTA) 90 MG TABS tablet Take 1 tablet (90 mg total) by mouth 2 (two) times daily. 09/26/21  Yes Hammock, Lavonna Rua, NP  meclizine (ANTIVERT) 25 MG tablet Take 1 tablet (25 mg total) by mouth 3 (three) times daily as needed for dizziness. 07/05/21   Minna Antis, MD  nitroGLYCERIN (NITROSTAT) 0.4 MG SL tablet Place 1 tablet (0.4 mg total) under the tongue every 5 (five) minutes as needed for chest pain. 09/26/21   Charlsie Quest, NP     ALLERGIES:  No Known Allergies   SOCIAL HISTORY:  Social History   Socioeconomic History   Marital status: Divorced    Spouse name: Not on file   Number of children: Not on file   Years of education: Not on file   Highest education level: Not on file  Occupational History   Not on file  Tobacco Use   Smoking status: Former    Packs/day: 0.50    Types: Cigarettes    Quit date: 07/06/2020    Years since quitting: 1.4   Smokeless tobacco: Never  Vaping Use   Vaping Use: Never used  Substance and Sexual Activity  Alcohol use: Not Currently   Drug use: Not Currently   Sexual activity: Not on file  Other Topics Concern   Not on file  Social History Narrative   Not on file   Social Determinants of Health   Financial Resource Strain: Not on file  Food Insecurity: Not on file  Transportation Needs: Not on file  Physical Activity: Not on file  Stress: Not on file  Social Connections: Not on file  Intimate Partner Violence: Not on file      FAMILY HISTORY:  History reviewed. No pertinent family history.   REVIEW OF SYSTEMS:  Constitutional: denies weight loss, fever, chills, or sweats  Eyes: denies any other vision changes, history of eye injury  ENT: denies sore throat, hearing problems  Respiratory: denies shortness of breath, wheezing.  Positive for coughing Cardiovascular: Positive chest pain Gastrointestinal: Positive for epigastric abdominal pain  Genitourinary: denies burning with urination or urinary  frequency Musculoskeletal: denies any other joint pains or cramps  Skin: denies any other rashes or skin discolorations  Neurological: denies any other headache, dizziness, weakness  Psychiatric: denies any other depression, anxiety   All other review of systems were negative   VITAL SIGNS:  Temp:  [97.5 F (36.4 C)-99.3 F (37.4 C)] 98.3 F (36.8 C) (08/14 1607) Pulse Rate:  [75-94] 82 (08/14 1607) Resp:  [14-20] 20 (08/14 1607) BP: (118-150)/(79-93) 143/85 (08/14 1607) SpO2:  [98 %-100 %] 98 % (08/14 1607)     Height: 5\' 3"  (160 cm) Weight: 88 kg BMI (Calculated): 34.36   INTAKE/OUTPUT:  This shift: Total I/O In: 200 [P.O.:200] Out: 400 [Urine:400]  Last 2 shifts: @IOLAST2SHIFTS @   PHYSICAL EXAM:  Constitutional:  -- Normal body habitus  -- Awake, alert, and oriented x3  Eyes:  -- Pupils equally round and reactive to light  -- No scleral icterus  Ear, nose, and throat:  -- No jugular venous distension  Pulmonary:  -- No crackles  -- Equal breath sounds bilaterally -- Breathing non-labored at rest Cardiovascular:  -- S1, S2 present  -- No pericardial rubs Gastrointestinal:  -- Abdomen soft, nontender, non-distended, no guarding or rebound tenderness -- No abdominal masses appreciated, pulsatile or otherwise  Musculoskeletal and Integumentary:  -- Wounds: None appreciated -- Extremities: B/L UE and LE FROM, hands and feet warm, no edema  Neurologic:  -- Motor function: intact and symmetric -- Sensation: intact and symmetric   Labs:     Latest Ref Rng & Units 12/30/2021    5:18 AM 12/29/2021    5:51 AM 12/28/2021    3:06 PM  CBC  WBC 4.0 - 10.5 K/uL 7.0  7.8  4.2   Hemoglobin 13.0 - 17.0 g/dL 12/31/2021  02/27/2022  17.4   Hematocrit 39.0 - 52.0 % 36.1  37.8  39.5   Platelets 150 - 400 K/uL 156  172  164       Latest Ref Rng & Units 12/30/2021    5:18 AM 12/29/2021    5:51 AM 12/28/2021    3:06 PM  CMP  Glucose 70 - 99 mg/dL 93  12/31/2021  02/27/2022   BUN 8 - 23 mg/dL 13  14   16    Creatinine 0.61 - 1.24 mg/dL 185  631    Sodium 135 - 145 mmol/L 138  136  136   Potassium 3.5 - 5.1 mmol/L 3.7  3.8  4.0   Chloride 98 - 111 mmol/L 111  106  106   CO2 22 -  32 mmol/L 23  25  23    Calcium 8.9 - 10.3 mg/dL 8.3  7.9  8.2   Total Protein 6.5 - 8.1 g/dL 6.6     Total Bilirubin 0.3 - 1.2 mg/dL 3.3     Alkaline Phos 38 - 126 U/L 128     AST 15 - 41 U/L 103     ALT 0 - 44 U/L 107       Imaging studies:  EXAM: ULTRASOUND ABDOMEN LIMITED RIGHT UPPER QUADRANT   COMPARISON:  CT scan of same day.   FINDINGS: Gallbladder:   No gallstones or wall thickening visualized. No sonographic Murphy sign noted by sonographer. Small amount of pericholecystic fluid is noted.   Common bile duct:   Diameter: 4 mm which is within normal limits.   Liver:   No focal lesion identified. Increased echogenicity of hepatic parenchyma is noted suggesting hepatic steatosis or other diffuse hepatocellular disease. Portal vein is patent on color Doppler imaging with normal direction of blood flow towards the liver.   Other: None.   IMPRESSION: No cholelithiasis or gallbladder wall thickening is noted. No sonographic Murphy's sign is noted. Small amount of pericholecystic fluid is noted which may be related to adjacent hepatocellular disease, but acalculous cholecystitis cannot be excluded. HIDA scan may be performed for further evaluation.     Electronically Signed   By: M.D.   On: 12/27/2021 09:14  Assessment/Plan:  61 y.o. male with sepsis with fever and bacteremia, complicated by pertinent comorbidities including coronary artery disease.  -Initially admitted with atypical pneumonia to the patient has been complaining of cough, hypoxemia and household with same symptoms -Blood culture shows gram-negative rods -Patient has been complaining of epigastric pain and keep her call images for cholecystitis.  Primary report of HIDA scan negative for acute  cholecystitis with patent cystic duct.  We will follow-up official HIDA scan report  -If patient continue doing fever and if bilirubin increases I would recommend to get MRCP to rule out choledocholithiasis/cholangitis  -If the bilirubin normalized but patient continued having fevers and questionable cholecystitis I would recommend percutaneous cholecystostomy for drainage of the gallbladder.  As per IR he recommends to repeat right upper quadrant ultrasound.  -Otherwise, since abdominal exam is benign, if patient starts to respond to IV antibiotic therapy and he does not develop any further fever and the bilirubin normalized it would not recommend any further work-up for the gallbladder during this admission.  -I will continue to follow along.   67, MD

## 2021-12-31 DIAGNOSIS — K810 Acute cholecystitis: Secondary | ICD-10-CM

## 2021-12-31 LAB — CBC
HCT: 37.5 % — ABNORMAL LOW (ref 39.0–52.0)
Hemoglobin: 12.5 g/dL — ABNORMAL LOW (ref 13.0–17.0)
MCH: 28 pg (ref 26.0–34.0)
MCHC: 33.3 g/dL (ref 30.0–36.0)
MCV: 83.9 fL (ref 80.0–100.0)
Platelets: 157 10*3/uL (ref 150–400)
RBC: 4.47 MIL/uL (ref 4.22–5.81)
RDW: 12.5 % (ref 11.5–15.5)
WBC: 6 10*3/uL (ref 4.0–10.5)
nRBC: 0 % (ref 0.0–0.2)

## 2021-12-31 LAB — CULTURE, BLOOD (ROUTINE X 2)

## 2021-12-31 LAB — COMPREHENSIVE METABOLIC PANEL
ALT: 81 U/L — ABNORMAL HIGH (ref 0–44)
AST: 60 U/L — ABNORMAL HIGH (ref 15–41)
Albumin: 2.8 g/dL — ABNORMAL LOW (ref 3.5–5.0)
Alkaline Phosphatase: 126 U/L (ref 38–126)
Anion gap: 8 (ref 5–15)
BUN: 15 mg/dL (ref 8–23)
CO2: 20 mmol/L — ABNORMAL LOW (ref 22–32)
Calcium: 8.4 mg/dL — ABNORMAL LOW (ref 8.9–10.3)
Chloride: 106 mmol/L (ref 98–111)
Creatinine, Ser: 0.8 mg/dL (ref 0.61–1.24)
GFR, Estimated: >60 mL/min (ref >60)
Glucose, Bld: 88 mg/dL (ref 70–99)
Potassium: 3.6 mmol/L (ref 3.5–5.1)
Sodium: 134 mmol/L — ABNORMAL LOW (ref 135–145)
Total Bilirubin: 2 mg/dL — ABNORMAL HIGH (ref 0.3–1.2)
Total Protein: 6.8 g/dL (ref 6.5–8.1)

## 2021-12-31 NOTE — Progress Notes (Signed)
  Progress Note   Patient: Jeffery Sweeney HQI:[id number] DOB: 06-20-1960 DOA: 12/28/2021     3 DOS: the patient was seen and examined on 12/31/2021 at 9:05AM      Brief hospital course: Jeffery Sweeney is a 61 y.o. M with hx CAD s/p CABG 2021, HTN, HLD and hx CVA who presented with chest pain, and shortness of breath.    In the ER, hypoxic to 83% required 4L o2.  ECG and troponins normal but CXR and follow up CTA chest showed bilateral lower lobe pneumonia. Procal 14.  Admitted on antibiotics for pneumonia.     Assessment and Plan: * Severe sepsis due to Bacteremia due to Enterobacter species Multifocal pneumonia Possible acute cholecystitis Patient presented with fever, tachycardia, respiratory failure, and found to have Enterobacter bacteremia.  Source somewhat unclear.  Pneumonia on CT is atypical in distribution.    LFTs increased and HIDA equivocal.  Discussed with General Surgery.  At present, they favor that this is not cholecystitis.  See their note from 8/14 for further details.  LFTs today better, still no abdominal pain at all.  - Continue cefepime - Discussed with RPh, would need Bactrim or Cipro at discharge for Enterobacter given AmpC gene  - Follow urine and sputum cultures  - Consult Gen Surg, appreciate expertise - Trend LFTs      Obesity (BMI 30-39.9) BMI 34  Hyperlipidemia LDL goal <70 On Repatha - Continue home Crestor  CAD, multiple vessel - Continue aspirin, Crestor - Continue home metoprolol  Hypertension Blood pressure normal - Continue home metoprolol  Acute respiratory failure with hypoxia (HCC) Presented with SpO2 83% on room air, tachypnea and respiratory distress and bilateral infiltratse on CXR, requiring 4L O2 to maintain sats.  With last two days treatment, able to wean O2.        Subjective: Patient has no abdominal pain, he had no further fever, no confusion, no respiratory distress.     Physical Exam: Vitals:   12/31/21  0335 12/31/21 0730 12/31/21 1153 12/31/21 1508  BP: (!) 150/84 127/80 (!) 130/98 121/73  Pulse: 70 84 79 71  Resp: 18 16 16 18   Temp: 98.5 F (36.9 C) 98.5 F (36.9 C) 97.8 F (36.6 C) 98.4 F (36.9 C)  TempSrc:  Oral Oral   SpO2: 98% 97% 99% 98%  Weight:      Height:       Obese adult male, lying in bed, no acute distress RRR, no murmurs, no peripheral edema Respiratory rate normal, no rales or wheezes Abdomen soft without tenderness palpation involving any quadrant, no rigidity, no guarding Attention normal, affect appropriate, judgment and insight appear normal  Data Reviewed: AST and ALT from 103/107 down to 60/81 Total bilirubin 3.3 down to 2 Complete blood count no change, normal Creatinine and sodium and potassium normal      Disposition: Status is: Inpatient The patient was admitted with sepsis, and found to have Enterobacter bacteremia.  The source of his bacteremia, is probably a pneumonia, although his imaging of his gallbladder has been equivocally abnormal.  We will monitor for another 24 hours, if his LFTs continue to improve, likely will discharge in the next 24 to 48 hours on oral Bactrim for Enterobacter bacteremia from pneumonia source  If his LFTs trend up, we will discuss with general surgery, and likely obtain MRCP        Author: , MD 12/31/2021 3:15 PM  For on call review [email or web address].

## 2021-12-31 NOTE — Progress Notes (Signed)
Mobility Specialist - Progress Note    12/31/21 1247  Mobility  Activity Ambulated independently in hallway;Stood at bedside;Dangled on edge of bed  Level of Assistance Independent  Assistive Device None  Distance Ambulated (ft) 120 ft  Activity Response Tolerated well  $Mobility charge 1 Mobility   Pt semi-supine in bed on RA upon arrival. Pt STS and ambulates in hallway indep. Pt returns to EOB with needs in reach.   Terrilyn Saver  Mobility Specialist  12/31/21 12:49 PM

## 2021-12-31 NOTE — Progress Notes (Signed)
Patient ID: Jeffery Sweeney, male   DOB: [birth year], 61 y.o.   MRN: [id number]     SURGICAL PROGRESS NOTE   Hospital Day(s): 3.   Interval History: Patient seen and examined, no acute events or new complaints overnight. Patient reports feeling well.  Denies abdominal pain.  He endorses tolerating diet.  Vital signs in last 24 hours: [min-max] current  Temp:  [98.1 F (36.7 C)-98.5 F (36.9 C)] 98.5 F (36.9 C) (08/15 0730) Pulse Rate:  [70-89] 84 (08/15 0730) Resp:  [16-20] 16 (08/15 0730) BP: (124-150)/(79-87) 127/80 (08/15 0730) SpO2:  [97 %-99 %] 97 % (08/15 0730)     Height: 5\' 3"  (160 cm) Weight: 88 kg BMI (Calculated): 34.36   Physical Exam:  Constitutional: alert, cooperative and no distress  Respiratory: breathing non-labored at rest  Cardiovascular: regular rate and sinus rhythm  Gastrointestinal: soft, non-tender, and non-distended  Labs:     Latest Ref Rng & Units 12/31/2021    4:26 AM 12/30/2021    5:18 AM 12/29/2021    5:51 AM  CBC  WBC 4.0 - 10.5 K/uL 6.0  7.0  7.8   Hemoglobin 13.0 - 17.0 g/dL 12/31/2021  76.8  11.5   Hematocrit 39.0 - 52.0 % 37.5  36.1  37.8   Platelets 150 - 400 K/uL 157  156  172       Latest Ref Rng & Units 12/31/2021    4:26 AM 12/30/2021    5:18 AM 12/29/2021    5:51 AM  CMP  Glucose 70 - 99 mg/dL 88  93  12/31/2021   BUN 8 - 23 mg/dL 15  13  14    Creatinine 0.61 - 1.24 mg/dL 203   5.59   Sodium 135 - 145 mmol/L 134  138  136   Potassium 3.5 - 5.1 mmol/L 3.6  3.7  3.8   Chloride 98 - 111 mmol/L 106  111  106   CO2 22 - 32 mmol/L 20  23  25    Calcium 8.9 - 10.3 mg/dL 8.4  8.3  7.9   Total Protein 6.5 - 8.1 g/dL 6.8  6.6    Total Bilirubin 0.3 - 1.2 mg/dL 2.0  3.3    Alkaline Phos 38 - 126 U/L 126  128    AST 15 - 41 U/L 60  103    ALT 0 - 44 U/L 81  107      Imaging studies:   EXAM: NUCLEAR MEDICINE HEPATOBILIARY IMAGING   TECHNIQUE: Sequential images of the abdomen were obtained out to 60 minutes following intravenous  administration of radiopharmaceutical.   RADIOPHARMACEUTICALS:  7.69 mCi Tc-59m  Choletec IV   COMPARISON:  Right upper quadrant ultrasound 12/27/2021, CT abdomen 12/27/2021   FINDINGS: Prompt uptake of activity by the liver is seen. There is relative decreased clearance of activity from the liver as can be seen with hepatic dysfunction. Gallbladder activity is not initially visualized, but following administration of 3 mg of morphine the gallbladder is visualized, consistent with patency of cystic duct. Biliary activity passes into small bowel, consistent with patent common bile duct.   IMPRESSION: 1. Relative decreased clearance of radiotracer activity from the liver as can be seen with hepatic dysfunction. 2. Patent cystic duct and common bile duct. No evidence of acute cholecystitis. 3. Delayed filling of the gallbladder and no evidence of gallstones on the recent right upper quadrant ultrasound. This appearance can be seen in the setting of chronic acalculous biliary disease.  Electronically Signed   By: Elige Ko M.D.   On: 12/31/2021 05:23   Assessment/Plan:  61 y.o. male with severe sepsis due to bacteremia, complicated by pertinent comorbidities including hyperlipidemia, coronary artery disease.  Clinically patient without abdominal pain.  Liver functions tests are trending down.  Bilirubin is also trending down.  HIDA scan was reported as patent cystic duct without evidence of acute cholecystitis.  This finding foods cholecystitis less likely as the source of the bacteremia.  I also have low suspicious of "chronic acalculous biliary disease".  Continue medical therapy for sepsis due to bacteremia.  At this point with improved labs and clinically patient without abdominal pain and tolerating diet I do not recommend any further study.  I will continue to follow along.  Gae Gallop, MD

## 2022-01-01 ENCOUNTER — Other Ambulatory Visit: Payer: Self-pay

## 2022-01-01 LAB — CBC
HCT: 38.2 % — ABNORMAL LOW (ref 39.0–52.0)
Hemoglobin: 13 g/dL (ref 13.0–17.0)
MCH: 28.1 pg (ref 26.0–34.0)
MCHC: 34 g/dL (ref 30.0–36.0)
MCV: 82.5 fL (ref 80.0–100.0)
Platelets: 177 10*3/uL (ref 150–400)
RBC: 4.63 MIL/uL (ref 4.22–5.81)
RDW: 12.5 % (ref 11.5–15.5)
WBC: 6.1 10*3/uL (ref 4.0–10.5)
nRBC: 0 % (ref 0.0–0.2)

## 2022-01-01 LAB — COMPREHENSIVE METABOLIC PANEL
ALT: 69 U/L — ABNORMAL HIGH (ref 0–44)
AST: 52 U/L — ABNORMAL HIGH (ref 15–41)
Albumin: 3 g/dL — ABNORMAL LOW (ref 3.5–5.0)
Alkaline Phosphatase: 141 U/L — ABNORMAL HIGH (ref 38–126)
Anion gap: 8 (ref 5–15)
BUN: 15 mg/dL (ref 8–23)
CO2: 20 mmol/L — ABNORMAL LOW (ref 22–32)
Calcium: 8.6 mg/dL — ABNORMAL LOW (ref 8.9–10.3)
Chloride: 108 mmol/L (ref 98–111)
Creatinine, Ser: 0.86 mg/dL (ref 0.61–1.24)
GFR, Estimated: >60 mL/min (ref >60)
Glucose, Bld: 85 mg/dL (ref 70–99)
Potassium: 3.7 mmol/L (ref 3.5–5.1)
Sodium: 136 mmol/L (ref 135–145)
Total Bilirubin: 1.3 mg/dL — ABNORMAL HIGH (ref 0.3–1.2)
Total Protein: 7 g/dL (ref 6.5–8.1)

## 2022-01-01 MED ORDER — CIPROFLOXACIN HCL 500 MG PO TABS
500.0000 mg | ORAL_TABLET | Freq: Two times a day (BID) | ORAL | 0 refills | Status: AC
  Filled 2022-01-01: qty 12, 6d supply, fill #0

## 2022-01-01 MED ORDER — CALCIUM CARBONATE ANTACID 500 MG PO CHEW: 1.0000 | CHEWABLE_TABLET | Freq: Two times a day (BID) | ORAL | Status: DC | PRN

## 2022-01-01 MED ORDER — SODIUM BICARBONATE 650 MG PO TABS
650.0000 mg | ORAL_TABLET | Freq: Two times a day (BID) | ORAL | Status: DC
  Administered 2022-01-01: 650 mg via ORAL
  Filled 2022-01-01: qty 1

## 2022-01-01 MED ORDER — BENZONATATE 100 MG PO CAPS
100.0000 mg | ORAL_CAPSULE | Freq: Three times a day (TID) | ORAL | 0 refills | Status: AC
  Filled 2022-01-01: qty 20, 7d supply, fill #0

## 2022-01-01 MED ORDER — CIPROFLOXACIN HCL 500 MG PO TABS
500.0000 mg | ORAL_TABLET | Freq: Two times a day (BID) | ORAL | Status: DC
Start: 2022-01-01 — End: 2022-01-01
  Administered 2022-01-01: 500 mg via ORAL
  Filled 2022-01-01 (×2): qty 1

## 2022-01-01 NOTE — Discharge Summary (Signed)
Jeffery Sweeney [id number] DOB: [birth year] DOA: 12/28/2021  PCP: Wellington Hampshire, MD  Admit date: 12/28/2021 Discharge date: 01/01/2022  Admitted From: home Disposition:  home  Recommendations for Outpatient Follow-up:  Follow up with PCP in 1 week Please obtain BMP/CBC in one week      Discharge Condition:Stable Diet recommendation: Heart Healthy  Brief/Interim Summary: PER HPI: Jeffery Sweeney is a 61 y.o. male with a known history of Hypertension, hyperlipidemia, CAD status post MI, CVA presents to the emergency department for evaluation of chest pain.  Patient was in a usual state of health until last night when he developed sudden onset of midsternal chest pain described as 8 out of 10.  Reports difficulty breathing with exertion, shortness of breath and cough.  States that his roommate has had a cough and fever as well.     Severe sepsis due to Bacteremia due to Enterobacter species Multifocal pneumonia Possible acute cholecystitis Patient presented with fever, tachycardia, respiratory failure, and found to have Enterobacter bacteremia.  Source somewhat unclear.  Pneumonia on CT is atypical in distribution.    LFTs increased and HIDA equivocal.   No surgical plan by surgery.  low suspicious of chronic acalculous biliary disease         Obesity (BMI 30-39.9)    Hyperlipidemia LDL goal <70 On Repatha On statin    CAD, multiple vessel Continue home meds, beta blk, asa, statin   Hypertension Stable Continue home meds   Acute respiratory failure with hypoxia ([place]) Presented with SpO2 83% on room air, tachypnea and respiratory distress and bilateral infiltratse on CX. 02 weaned down.  Was tx with abx .       Discharge Diagnoses:  Principal Problem:   Severe sepsis ([place]) Active Problems:   Acute respiratory failure with hypoxia ([facility])   Multifocal pneumonia   Bacteremia due to Enterobacter species   Possible acute cholecystitis   Hypertension   CAD,  multiple vessel   Hyperlipidemia LDL goal <70   Obesity (BMI 30-39.9)    Discharge Instructions  Discharge Instructions     Diet - low sodium heart healthy   Complete by: As directed    Discharge instructions   Complete by: As directed    F/u with pcp   Increase activity slowly   Complete by: As directed       Allergies as of 01/01/2022   No Known Allergies      Medication List     STOP taking these medications    oxyCODONE-acetaminophen 5-325 MG tablet Commonly known as: PERCOCET/ROXICET   rosuvastatin 40 MG tablet Commonly known as: CRESTOR       TAKE these medications    aspirin 81 MG chewable tablet Chew 1 tablet (81 mg total) by mouth daily.   benzonatate 100 MG capsule Commonly known as: TESSALON Take 1 capsule (100 mg total) by mouth 3 (three) times daily for 7 days.   calcium carbonate 500 MG chewable tablet Commonly known as: TUMS - dosed in mg elemental calcium Chew 1 tablet (200 mg of elemental calcium total) by mouth 2 (two) times daily as needed for heartburn.   ciprofloxacin 500 MG tablet Commonly known as: CIPRO Take 1 tablet (500 mg total) by mouth 2 (two) times daily for 6 days.   meclizine 25 MG tablet Commonly known as: ANTIVERT Take 1 tablet (25 mg total) by mouth 3 (three) times daily as needed for dizziness.   metoprolol tartrate 50 MG tablet Commonly known as: LOPRESSOR Take  1 tablet (50 mg total) by mouth 2 (two) times daily.   nitroGLYCERIN 0.4 MG SL tablet Commonly known as: NITROSTAT Place 1 tablet (0.4 mg total) under the tongue every 5 (five) minutes as needed for chest pain.   ondansetron 4 MG disintegrating tablet Commonly known as: ZOFRAN-ODT Take 1 tablet (4 mg total) by mouth every 8 (eight) hours as needed for up to 10 days for nausea or vomiting.   pantoprazole 40 MG tablet Commonly known as: PROTONIX Take 1 tablet (40 mg total) by mouth once daily.   Repatha SureClick [id number] MG/ML Soaj Generic drug:  Evolocumab Inject 140 mg into the skin every 14 (fourteen) days.        Follow-up Information     Wellington Hampshire, MD. Go in 1 week(s).   Specialty: Cardiology Why: Appointment on Monday, 02/03/2022 at 8:50am. Contact information: [place] [address] [phone]                No Known Allergies  Consultations: gsx   Procedures/Studies: NM Hepatobiliary Liver Func  Result Date: 12/31/2021 CLINICAL DATA:  Right upper quadrant pain.  Elevated bilirubin. EXAM: NUCLEAR MEDICINE HEPATOBILIARY IMAGING TECHNIQUE: Sequential images of the abdomen were obtained out to 60 minutes following intravenous administration of radiopharmaceutical. RADIOPHARMACEUTICALS:  7.69 mCi Tc-23m  Choletec IV COMPARISON:  Right upper quadrant ultrasound 12/27/2021, CT abdomen 12/27/2021 FINDINGS: Prompt uptake of activity by the liver is seen. There is relative decreased clearance of activity from the liver as can be seen with hepatic dysfunction. Gallbladder activity is not initially visualized, but following administration of 3 mg of morphine the gallbladder is visualized, consistent with patency of cystic duct. Biliary activity passes into small bowel, consistent with patent common bile duct. IMPRESSION: 1. Relative decreased clearance of radiotracer activity from the liver as can be seen with hepatic dysfunction. 2. Patent cystic duct and common bile duct. No evidence of acute cholecystitis. 3. Delayed filling of the gallbladder and no evidence of gallstones on the recent right upper quadrant ultrasound. This appearance can be seen in the setting of chronic acalculous biliary disease. Electronically Signed   By: Kathreen Devoid M.D.   On: 12/31/2021 05:23   CT Angio Chest PE W and/or Wo Contrast  Result Date: 12/28/2021 CLINICAL DATA:  Substernal chest pain, high probability PE EXAM: CT ANGIOGRAPHY CHEST WITH CONTRAST TECHNIQUE: Multidetector CT imaging of the chest was  performed using the standard protocol during bolus administration of intravenous contrast. Multiplanar CT image reconstructions and MIPs were obtained to evaluate the vascular anatomy. RADIATION DOSE REDUCTION: This exam was performed according to the departmental dose-optimization program which includes automated exposure control, adjustment of the mA and/or kV according to patient size and/or use of iterative reconstruction technique. CONTRAST:  46mL OMNIPAQUE IOHEXOL 350 MG/ML SOLN COMPARISON:  Radiographs earlier today FINDINGS: Cardiovascular: Sternotomy and CABG. Aortic and coronary artery atherosclerotic calcification. Normal heart size. No pericardial effusion. Mediastinum/Nodes: No thoracic adenopathy by size. Unremarkable thyroid and esophagus. The central airways are patent. Lungs/Pleura: Diffuse bronchial wall thickening and scattered mucous plugging greatest in the lower lungs. Air trapping in the lower lungs. Lower lobe predominant atelectasis/scarring. No pleural effusion or pneumothorax. Upper Abdomen: No acute abnormality. Musculoskeletal: No chest wall abnormality. No acute or significant osseous findings. Review of the MIP images confirms the above findings. IMPRESSION: 1. Negative for acute pulmonary embolism. 2. Bronchial inflammation and air trapping greatest in the lower lungs. 3.  Aortic Atherosclerosis (ICD10-I70.0). Electronically Signed  By: Minerva Fester M.D.   On: 12/28/2021 19:03   DG Chest 2 View  Result Date: 12/28/2021 CLINICAL DATA:  Chest pain EXAM: CHEST - 2 VIEW COMPARISON:  10/10/2020 FINDINGS: Cardiac size is within normal limits. Lung fields are clear of any infiltrates or pulmonary edema. There is no pleural effusion or pneumothorax. There is previous coronary bypass surgery. There is a tubular structure seen in left side of the mediastinum, possibly vascular stents. This finding was not seen in the previous examination. IMPRESSION: There are no signs of pulmonary  edema or focal pulmonary consolidation. Previous coronary bypass surgery. There is a possible vascular stent in left side of mediastinum. Electronically Signed   By: Ernie Avena M.D.   On: 12/28/2021 15:54   US Abdomen Limited RUQ (LIVER/GB)  Result Date: 12/27/2021 CLINICAL DATA:  Acute right upper quadrant abdominal pain. EXAM: ULTRASOUND ABDOMEN LIMITED RIGHT UPPER QUADRANT COMPARISON:  CT scan of same day. FINDINGS: Gallbladder: No gallstones or wall thickening visualized. No sonographic Murphy sign noted by sonographer. Small amount of pericholecystic fluid is noted. Common bile duct: Diameter: 4 mm which is within normal limits. Liver: No focal lesion identified. Increased echogenicity of hepatic parenchyma is noted suggesting hepatic steatosis or other diffuse hepatocellular disease. Portal vein is patent on color Doppler imaging with normal direction of blood flow towards the liver. Other: None. IMPRESSION: No cholelithiasis or gallbladder wall thickening is noted. No sonographic Murphy's sign is noted. Small amount of pericholecystic fluid is noted which may be related to adjacent hepatocellular disease, but acalculous cholecystitis cannot be excluded. HIDA scan may be performed for further evaluation. Electronically Signed   By: Lupita Raider M.D.   On: 12/27/2021 09:14   CT Abdomen Pelvis W Contrast  Result Date: 12/27/2021 CLINICAL DATA:  Epigastric abdominal pain. EXAM: CT ABDOMEN AND PELVIS WITH CONTRAST TECHNIQUE: Multidetector CT imaging of the abdomen and pelvis was performed using the standard protocol following bolus administration of intravenous contrast. RADIATION DOSE REDUCTION: This exam was performed according to the departmental dose-optimization program which includes automated exposure control, adjustment of the mA and/or kV according to patient size and/or use of iterative reconstruction technique. CONTRAST:  OMNIPAQUE IOHEXOL 300 MG/ML  SOLN COMPARISON:  March 02, 2019. FINDINGS: Lower chest: No acute abnormality. Hepatobiliary: No gallstones or biliary dilatation is noted. Hepatic steatosis is noted. Mild pericholecystic fluid is noted suggesting cholecystitis. Pancreas: Unremarkable. No pancreatic ductal dilatation or surrounding inflammatory changes. Spleen: Normal in size without focal abnormality. Adrenals/Urinary Tract: Adrenal glands appear normal. Stable left renal cyst is noted for which no further follow-up is required. No hydronephrosis or renal obstruction is noted. Urinary bladder is unremarkable. Stomach/Bowel: Stomach is within normal limits. Appendix appears normal. No evidence of bowel wall thickening, distention, or inflammatory changes. Sigmoid diverticulosis is noted without inflammation. Vascular/Lymphatic: Aortic atherosclerosis. No enlarged abdominal or pelvic lymph nodes. Reproductive: Prostate is unremarkable. Other: No abdominal wall hernia or abnormality. No abdominopelvic ascites. Musculoskeletal: No acute or significant osseous findings. IMPRESSION: No cholelithiasis is noted, but mild pericholecystic fluid is noted suggesting cholecystitis. Ultrasound is recommended for further evaluation. Hepatic steatosis. Sigmoid diverticulosis without inflammation. Aortic Atherosclerosis (ICD10-I70.0). Electronically Signed   By: Lupita Raider M.D.   On: 12/27/2021 08:53      Subjective: Feels well wants to go home. No complaints  Discharge Exam: Vitals:   01/01/22 0756 01/01/22 1139  BP: 132/72 (!) 128/90  Pulse: 76 76  Resp: 16 18  Temp: 98.6 F (  37 C) 97.8 F (36.6 C)  SpO2: 97% 98%   Vitals:   12/31/21 2353 01/01/22 0401 01/01/22 0756 01/01/22 1139  BP:  136/75 132/72 (!) 128/90  Pulse: 72 75 76 76  Resp: 18 16 16 18   Temp:  98.4 F (36.9 C) 98.6 F (37 C) 97.8 F (36.6 C)  TempSrc:   Oral Oral  SpO2:  93% 97% 98%  Weight:      Height:        General: Pt is alert, awake, not in acute distress Cardiovascular: RRR,  S1/S2 +, no rubs, no gallops Respiratory: CTA bilaterally, no wheezing, no rhonchi Abdominal: Soft, NT, ND, bowel sounds + Extremities: no edema, no cyanosis    The results of significant diagnostics from this hospitalization (including imaging, microbiology, ancillary and laboratory) are listed below for reference.     Microbiology: Recent Results (from the past 240 hour(s))  Resp Panel by RT-PCR (Flu A&B, Covid) Anterior Nasal Swab     Status: None   Collection Time: 12/28/21  4:55 PM   Specimen: Anterior Nasal Swab  Result Value Ref Range Status   SARS Coronavirus 2 by RT PCR NEGATIVE NEGATIVE Final    Comment: (NOTE) SARS-CoV-2 target nucleic acids are NOT DETECTED.  The SARS-CoV-2 RNA is generally detectable in upper respiratory specimens during the acute phase of infection. The lowest concentration of SARS-CoV-2 viral copies this assay can detect is 138 copies/mL. A negative result does not preclude SARS-Cov-2 infection and should not be used as the sole basis for treatment or other patient management decisions. A negative result may occur with  improper specimen collection/handling, submission of specimen other than nasopharyngeal swab, presence of viral mutation(s) within the areas targeted by this assay, and inadequate number of viral copies(<138 copies/mL). A negative result must be combined with clinical observations, patient history, and epidemiological information. The expected result is Negative.  Fact Sheet for Patients:  EntrepreneurPulse.com.au  Fact Sheet for Healthcare Providers:  IncredibleEmployment.be  This test is no t yet approved or cleared by the [place] FDA and  has been authorized for detection and/or diagnosis of SARS-CoV-2 by FDA under an Emergency Use Authorization (EUA). This EUA will remain  in effect (meaning this test can be used) for the duration of the COVID-19 declaration under Section 564(b)(1)  of the Act, 21 U.S.C.section 360bbb-3(b)(1), unless the authorization is terminated  or revoked sooner.       Influenza A by PCR NEGATIVE NEGATIVE Final   Influenza B by PCR NEGATIVE NEGATIVE Final    Comment: (NOTE) The Xpert Xpress SARS-CoV-2/FLU/RSV plus assay is intended as an aid in the diagnosis of influenza from Nasopharyngeal swab specimens and should not be used as a sole basis for treatment. Nasal washings and aspirates are unacceptable for Xpert Xpress SARS-CoV-2/FLU/RSV testing.  Fact Sheet for Patients: EntrepreneurPulse.com.au  Fact Sheet for Healthcare Providers: IncredibleEmployment.be  This test is not yet approved or cleared by the [place] FDA and has been authorized for detection and/or diagnosis of SARS-CoV-2 by FDA under an Emergency Use Authorization (EUA). This EUA will remain in effect (meaning this test can be used) for the duration of the COVID-19 declaration under Section 564(b)(1) of the Act, 21 U.S.C. section 360bbb-3(b)(1), unless the authorization is terminated or revoked.  Performed at [facility], [address] [place], [place] [address]   Blood culture (routine x 2)     Status: Abnormal   Collection Time: 12/28/21  7:30 PM   Specimen: BLOOD  Result Value Ref Range Status   Specimen Description   Final    BLOOD LEFT AC Performed at [facility], [address] [place], [place] [address]    Special Requests   Final    BOTTLES DRAWN AEROBIC AND ANAEROBIC Blood Culture results may not be optimal due to an inadequate volume of blood received in culture bottles Performed at [facility], [address] [place], [place] [address]    Culture  Setup Time   Final    ANAEROBIC BOTTLE ONLY GRAM NEGATIVE RODS CRITICAL RESULT CALLED TO, READ BACK BY AND VERIFIED WITH: WILL ANDERSON @1407  12/29/21 MJU Performed at [facility], [place] [address], [place], [place]  [address]    Culture ENTEROBACTER CLOACAE (A)  Final   Report Status 12/31/2021 FINAL  Final   Organism ID, Bacteria ENTEROBACTER CLOACAE  Final      Susceptibility   Enterobacter cloacae - MIC*    CEFAZOLIN >=64 RESISTANT Resistant     CEFEPIME <=0.12 SENSITIVE Sensitive     CEFTAZIDIME <=1 SENSITIVE Sensitive     CIPROFLOXACIN <=0.25 SENSITIVE Sensitive     GENTAMICIN <=1 SENSITIVE Sensitive     IMIPENEM 0.5 SENSITIVE Sensitive     TRIMETH/SULFA <=20 SENSITIVE Sensitive     PIP/TAZO <=4 SENSITIVE Sensitive     * ENTEROBACTER CLOACAE  Blood Culture ID Panel (Reflexed)     Status: Abnormal   Collection Time: 12/28/21  7:30 PM  Result Value Ref Range Status   Enterococcus faecalis NOT DETECTED NOT DETECTED Final   Enterococcus Faecium NOT DETECTED NOT DETECTED Final   Listeria monocytogenes NOT DETECTED NOT DETECTED Final   Staphylococcus species NOT DETECTED NOT DETECTED Final   Staphylococcus aureus (BCID) NOT DETECTED NOT DETECTED Final   Staphylococcus epidermidis NOT DETECTED NOT DETECTED Final   Staphylococcus lugdunensis NOT DETECTED NOT DETECTED Final   Streptococcus species NOT DETECTED NOT DETECTED Final   Streptococcus agalactiae NOT DETECTED NOT DETECTED Final   Streptococcus pneumoniae NOT DETECTED NOT DETECTED Final   Streptococcus pyogenes NOT DETECTED NOT DETECTED Final   A.calcoaceticus-baumannii NOT DETECTED NOT DETECTED Final   Bacteroides fragilis NOT DETECTED NOT DETECTED Final   Enterobacterales DETECTED (A) NOT DETECTED Final    Comment: Enterobacterales represent a large order of gram negative bacteria, not a single organism. CRITICAL RESULT CALLED TO, READ BACK BY AND VERIFIED WITH: WILL ANDERSON @1407  12/29/21 MJU    Enterobacter cloacae complex DETECTED (A) NOT DETECTED Final    Comment: CRITICAL RESULT CALLED TO, READ BACK BY AND VERIFIED WITH: WILL ANDERSON @1407  12/29/21 MJU    Escherichia coli NOT DETECTED NOT DETECTED Final   Klebsiella aerogenes NOT  DETECTED NOT DETECTED Final   Klebsiella oxytoca NOT DETECTED NOT DETECTED Final   Klebsiella pneumoniae NOT DETECTED NOT DETECTED Final   Proteus species NOT DETECTED NOT DETECTED Final   Salmonella species NOT DETECTED NOT DETECTED Final   Serratia marcescens NOT DETECTED NOT DETECTED Final   Haemophilus influenzae NOT DETECTED NOT DETECTED Final   Neisseria meningitidis NOT DETECTED NOT DETECTED Final   Pseudomonas aeruginosa NOT DETECTED NOT DETECTED Final   Stenotrophomonas maltophilia NOT DETECTED NOT DETECTED Final   Candida albicans NOT DETECTED NOT DETECTED Final   Candida auris NOT DETECTED NOT DETECTED Final   Candida glabrata NOT DETECTED NOT DETECTED Final   Candida krusei NOT DETECTED NOT DETECTED Final   Candida parapsilosis NOT DETECTED NOT DETECTED Final   Candida tropicalis NOT DETECTED NOT DETECTED Final  Cryptococcus neoformans/gattii NOT DETECTED NOT DETECTED Final   CTX-M ESBL NOT DETECTED NOT DETECTED Final   Carbapenem resistance IMP NOT DETECTED NOT DETECTED Final   Carbapenem resistance KPC NOT DETECTED NOT DETECTED Final   Carbapenem resistance NDM NOT DETECTED NOT DETECTED Final   Carbapenem resist OXA 48 LIKE NOT DETECTED NOT DETECTED Final   Carbapenem resistance VIM NOT DETECTED NOT DETECTED Final    Comment: Performed at [facility], [address] [place], [place] [address]  Blood culture (routine x 2)     Status: None   Collection Time: 12/28/21 10:27 PM   Specimen: BLOOD  Result Value Ref Range Status   Specimen Description BLOOD LEFT HAND  Final   Special Requests   Final    BOTTLES DRAWN AEROBIC AND ANAEROBIC Blood Culture results may not be optimal due to an inadequate volume of blood received in culture bottles   Culture   Final    NO GROWTH 5 DAYS Performed at [facility], [address] [place], [place] [address]    Report Status 01/02/2022 FINAL  Final  Urine Culture     Status: None   Collection Time: 12/29/21   4:14 PM   Specimen: Urine, Random  Result Value Ref Range Status   Specimen Description   Final    URINE, RANDOM Performed at [facility], [address] [place], [place] [address]    Special Requests   Final    NONE Performed at [facility], [address] [place], [place] [address]    Culture   Final    NO GROWTH Performed at [facility], [place] [address] [place], [place] [address]    Report Status 12/30/2021 FINAL  Final     Labs: BNP (last 3 results) No results for input(s): "BNP" in the last 8760 hours. Basic Metabolic Panel: Recent Labs  Lab 12/28/21 1506 12/29/21 0551 12/30/21 0518 12/31/21 0426 01/01/22 0500  NA 136 136 138 134* 136  K 4.0 3.8 3.7 3.6 3.7  CL 106 106 111 106 108  CO2 23 25 23  20* 20*  GLUCOSE 130* 107* 93 88 85  BUN 16 14 13 15 15   CREATININE 1.09 0.97 0.83 0.80 0.86  CALCIUM 8.2* 7.9* 8.3* 8.4* 8.6*  MG  --  2.1  --   --   --    Liver Function Tests: Recent Labs  Lab 12/30/21 0518 12/31/21 0426 01/01/22 0500  AST 103* 60* 52*  ALT 107* 81* 69*  ALKPHOS 128* 126 141*  BILITOT 3.3* 2.0* 1.3*  PROT 6.6 6.8 7.0  ALBUMIN 2.9* 2.8* 3.0*   No results for input(s): "LIPASE", "AMYLASE" in the last 168 hours.  No results for input(s): "AMMONIA" in the last 168 hours. CBC: Recent Labs  Lab 12/28/21 1506 12/29/21 0551 12/30/21 0518 12/31/21 0426 01/01/22 0500  WBC 4.2 7.8 7.0 6.0 6.1  NEUTROABS  --  6.1  --   --   --   HGB 13.1 12.2* 12.0* 12.5* 13.0  HCT 39.5 37.8* 36.1* 37.5* 38.2*  MCV 86.6 87.5 85.5 83.9 82.5  PLT 164 172 156 157 177   Cardiac Enzymes: No results for input(s): "CKTOTAL", "CKMB", "CKMBINDEX", "TROPONINI" in the last 168 hours. BNP: Invalid input(s): "POCBNP" CBG: Recent Labs  Lab 12/29/21 1533  GLUCAP 112*   D-Dimer No results for input(s): "DDIMER" in the last 72 hours. Hgb A1c No results for input(s): "HGBA1C" in the last 72 hours. Lipid Profile No  results for  input(s): "CHOL", "HDL", "LDLCALC", "TRIG", "CHOLHDL", "LDLDIRECT" in the last 72 hours. Thyroid function studies No results for input(s): "TSH", "T4TOTAL", "T3FREE", "THYROIDAB" in the last 72 hours.  Invalid input(s): "FREET3" Anemia work up No results for input(s): "VITAMINB12", "FOLATE", "FERRITIN", "TIBC", "IRON", "RETICCTPCT" in the last 72 hours. Urinalysis    Component Value Date/Time   COLORURINE YELLOW (A) 12/27/2021 0443   APPEARANCEUR CLEAR (A) 12/27/2021 0443   LABSPEC 1.024 12/27/2021 0443   PHURINE 5.0 12/27/2021 0443   GLUCOSEU NEGATIVE 12/27/2021 0443   HGBUR NEGATIVE 12/27/2021 0443   BILIRUBINUR NEGATIVE 12/27/2021 0443   KETONESUR NEGATIVE 12/27/2021 0443   PROTEINUR NEGATIVE 12/27/2021 0443   NITRITE NEGATIVE 12/27/2021 0443   LEUKOCYTESUR NEGATIVE 12/27/2021 0443   Sepsis Labs Recent Labs  Lab 12/29/21 0551 12/30/21 0518 12/31/21 0426 01/01/22 0500  WBC 7.8 7.0 6.0 6.1   Microbiology Recent Results (from the past 240 hour(s))  Resp Panel by RT-PCR (Flu A&B, Covid) Anterior Nasal Swab     Status: None   Collection Time: 12/28/21  4:55 PM   Specimen: Anterior Nasal Swab  Result Value Ref Range Status   SARS Coronavirus 2 by RT PCR NEGATIVE NEGATIVE Final    Comment: (NOTE) SARS-CoV-2 target nucleic acids are NOT DETECTED.  The SARS-CoV-2 RNA is generally detectable in upper respiratory specimens during the acute phase of infection. The lowest concentration of SARS-CoV-2 viral copies this assay can detect is 138 copies/mL. A negative result does not preclude SARS-Cov-2 infection and should not be used as the sole basis for treatment or other patient management decisions. A negative result may occur with  improper specimen collection/handling, submission of specimen other than nasopharyngeal swab, presence of viral mutation(s) within the areas targeted by this assay, and inadequate number of viral copies(<138 copies/mL). A negative result must be  combined with clinical observations, patient history, and epidemiological information. The expected result is Negative.  Fact Sheet for Patients:  BloggerCourse.com  Fact Sheet for Healthcare Providers:  SeriousBroker.it  This test is no t yet approved or cleared by the [place] FDA and  has been authorized for detection and/or diagnosis of SARS-CoV-2 by FDA under an Emergency Use Authorization (EUA). This EUA will remain  in effect (meaning this test can be used) for the duration of the COVID-19 declaration under Section 564(b)(1) of the Act, 21 U.S.C.section 360bbb-3(b)(1), unless the authorization is terminated  or revoked sooner.       Influenza A by PCR NEGATIVE NEGATIVE Final   Influenza B by PCR NEGATIVE NEGATIVE Final    Comment: (NOTE) The Xpert Xpress SARS-CoV-2/FLU/RSV plus assay is intended as an aid in the diagnosis of influenza from Nasopharyngeal swab specimens and should not be used as a sole basis for treatment. Nasal washings and aspirates are unacceptable for Xpert Xpress SARS-CoV-2/FLU/RSV testing.  Fact Sheet for Patients: BloggerCourse.com  Fact Sheet for Healthcare Providers: SeriousBroker.it  This test is not yet approved or cleared by the [place] FDA and has been authorized for detection and/or diagnosis of SARS-CoV-2 by FDA under an Emergency Use Authorization (EUA). This EUA will remain in effect (meaning this test can be used) for the duration of the COVID-19 declaration under Section 564(b)(1) of the Act, 21 U.S.C. section 360bbb-3(b)(1), unless the authorization is terminated or revoked.  Performed at [facility], [address], [place], [place] [address]   Blood culture (routine x 2)     Status: Abnormal   Collection Time: 12/28/21  7:30  PM   Specimen: BLOOD  Result Value Ref Range Status   Specimen Description    Final    BLOOD LEFT AC Performed at [facility], [address] [place], [place] [address]    Special Requests   Final    BOTTLES DRAWN AEROBIC AND ANAEROBIC Blood Culture results may not be optimal due to an inadequate volume of blood received in culture bottles Performed at [facility], [address], [place], [place] [address]    Culture  Setup Time   Final    ANAEROBIC BOTTLE ONLY GRAM NEGATIVE RODS CRITICAL RESULT CALLED TO, READ BACK BY AND VERIFIED WITH: WILL ANDERSON @1407  12/29/21 MJU Performed at [facility], [place] [address], [place], [place] [address]    Culture ENTEROBACTER CLOACAE (A)  Final   Report Status 12/31/2021 FINAL  Final   Organism ID, Bacteria ENTEROBACTER CLOACAE  Final      Susceptibility   Enterobacter cloacae - MIC*    CEFAZOLIN >=64 RESISTANT Resistant     CEFEPIME <=0.12 SENSITIVE Sensitive     CEFTAZIDIME <=1 SENSITIVE Sensitive     CIPROFLOXACIN <=0.25 SENSITIVE Sensitive     GENTAMICIN <=1 SENSITIVE Sensitive     IMIPENEM 0.5 SENSITIVE Sensitive     TRIMETH/SULFA <=20 SENSITIVE Sensitive     PIP/TAZO <=4 SENSITIVE Sensitive     * ENTEROBACTER CLOACAE  Blood Culture ID Panel (Reflexed)     Status: Abnormal   Collection Time: 12/28/21  7:30 PM  Result Value Ref Range Status   Enterococcus faecalis NOT DETECTED NOT DETECTED Final   Enterococcus Faecium NOT DETECTED NOT DETECTED Final   Listeria monocytogenes NOT DETECTED NOT DETECTED Final   Staphylococcus species NOT DETECTED NOT DETECTED Final   Staphylococcus aureus (BCID) NOT DETECTED NOT DETECTED Final   Staphylococcus epidermidis NOT DETECTED NOT DETECTED Final   Staphylococcus lugdunensis NOT DETECTED NOT DETECTED Final   Streptococcus species NOT DETECTED NOT DETECTED Final   Streptococcus agalactiae NOT DETECTED NOT DETECTED Final   Streptococcus pneumoniae NOT DETECTED NOT DETECTED Final   Streptococcus pyogenes NOT DETECTED NOT DETECTED Final    A.calcoaceticus-baumannii NOT DETECTED NOT DETECTED Final   Bacteroides fragilis NOT DETECTED NOT DETECTED Final   Enterobacterales DETECTED (A) NOT DETECTED Final    Comment: Enterobacterales represent a large order of gram negative bacteria, not a single organism. CRITICAL RESULT CALLED TO, READ BACK BY AND VERIFIED WITH: WILL ANDERSON @1407  12/29/21 MJU    Enterobacter cloacae complex DETECTED (A) NOT DETECTED Final    Comment: CRITICAL RESULT CALLED TO, READ BACK BY AND VERIFIED WITH: WILL ANDERSON @1407  12/29/21 MJU    Escherichia coli NOT DETECTED NOT DETECTED Final   Klebsiella aerogenes NOT DETECTED NOT DETECTED Final   Klebsiella oxytoca NOT DETECTED NOT DETECTED Final   Klebsiella pneumoniae NOT DETECTED NOT DETECTED Final   Proteus species NOT DETECTED NOT DETECTED Final   Salmonella species NOT DETECTED NOT DETECTED Final   Serratia marcescens NOT DETECTED NOT DETECTED Final   Haemophilus influenzae NOT DETECTED NOT DETECTED Final   Neisseria meningitidis NOT DETECTED NOT DETECTED Final   Pseudomonas aeruginosa NOT DETECTED NOT DETECTED Final   Stenotrophomonas maltophilia NOT DETECTED NOT DETECTED Final   Candida albicans NOT DETECTED NOT DETECTED Final   Candida auris NOT DETECTED NOT DETECTED Final   Candida glabrata NOT DETECTED NOT DETECTED Final   Candida krusei NOT DETECTED NOT DETECTED Final   Candida parapsilosis NOT DETECTED NOT DETECTED Final   Candida  tropicalis NOT DETECTED NOT DETECTED Final   Cryptococcus neoformans/gattii NOT DETECTED NOT DETECTED Final   CTX-M ESBL NOT DETECTED NOT DETECTED Final   Carbapenem resistance IMP NOT DETECTED NOT DETECTED Final   Carbapenem resistance KPC NOT DETECTED NOT DETECTED Final   Carbapenem resistance NDM NOT DETECTED NOT DETECTED Final   Carbapenem resist OXA 48 LIKE NOT DETECTED NOT DETECTED Final   Carbapenem resistance VIM NOT DETECTED NOT DETECTED Final    Comment: Performed at [facility], [address] [place], [place] [address]  Blood culture (routine x 2)     Status: None   Collection Time: 12/28/21 10:27 PM   Specimen: BLOOD  Result Value Ref Range Status   Specimen Description BLOOD LEFT HAND  Final   Special Requests   Final    BOTTLES DRAWN AEROBIC AND ANAEROBIC Blood Culture results may not be optimal due to an inadequate volume of blood received in culture bottles   Culture   Final    NO GROWTH 5 DAYS Performed at [facility], [address], [place], [place] [address]    Report Status 01/02/2022 FINAL  Final  Urine Culture     Status: None   Collection Time: 12/29/21  4:14 PM   Specimen: Urine, Random  Result Value Ref Range Status   Specimen Description   Final    URINE, RANDOM Performed at [facility], [address] [place], [place] [address]    Special Requests   Final    NONE Performed at [facility], [address] [place], [place] [address]    Culture   Final    NO GROWTH Performed at [facility], [place] [address] [place],  [address]    Report Status 12/30/2021 FINAL  Final     Time coordinating discharge: Over 30 minutes  SIGNED:   Nolberto Hanlon, MD  [facility] 01/03/2022, 3:49 PM Pager   If 7PM-7AM, please contact night-coverage [email or web address] Password [email or web address]

## 2022-01-01 NOTE — TOC Transition Note (Signed)
Transition of Care [facility]) - CM/SW Discharge Note   Patient Details  Name: [name] MRN: [id number] Date of Birth: November 26, 1960  [facility]) CM/SW Contact:  Margarito Liner, LCSW Phone Number: 01/01/2022, 10:57 AM   Clinical Narrative: Patient has orders to discharge home today. [facility] Pharmacist is aware. No further concerns. CSW signing off.    Final next level of care: Home/Self Care Barriers to Discharge: Barriers Resolved   Patient Goals and CMS Choice        Discharge Placement                Patient to be transferred to facility by: Friend or cab per patient on 8/14.   Patient and family notified of of transfer: 01/01/22  Discharge Plan and Services                                     Social Determinants of Health (SDOH) Interventions     Readmission Risk Interventions     No data to display

## 2022-01-01 NOTE — Progress Notes (Signed)
Mobility Specialist - Progress Note    01/01/22 1100  Mobility  Activity Ambulated independently in hallway;Stood at bedside;Dangled on edge of bed  Level of Assistance Independent  Assistive Device None  Distance Ambulated (ft) 200 ft  Activity Response Tolerated well  $Mobility charge 1 Mobility    Pt EOB on RA upon arrival. Pt STS and ambulates 1 lap around NS indep. Pt returns to room with needs in reach.  Terrilyn Saver  Mobility Specialist  01/01/22 11:03 AM

## 2022-01-02 ENCOUNTER — Telehealth: Payer: Self-pay | Admitting: Cardiovascular Disease

## 2022-01-02 LAB — CULTURE, BLOOD (ROUTINE X 2): Culture: NO GROWTH

## 2022-01-02 MED ORDER — TICAGRELOR 90 MG PO TABS: 90.0000 mg | ORAL_TABLET | Freq: Two times a day (BID) | ORAL | 0 refills | Status: DC

## 2022-01-02 NOTE — Addendum Note (Signed)
Addended by: Kendrick Fries on: 01/02/2022 09:32 AM   Modules accepted: Orders

## 2022-01-02 NOTE — Telephone Encounter (Signed)
*  STAT* If patient is at the pharmacy, call can be transferred to refill team.   1. Which medications need to be refilled? (please list name of each medication and dose if known) ticagrelor (BRILINTA) 90 MG TABS tablet  2. Which pharmacy/location (including street and city if local pharmacy) is medication to be sent to? [facility] - [place] ([place]), [place] - [address]  3. Do they need a 30 day or 90 day supply? 90

## 2022-01-02 NOTE — Telephone Encounter (Signed)
Pt c/o medication issue:  1. Name of Medication:   ticagrelor (BRILINTA) 90 MG TABS tablet    2. How are you currently taking this medication (dosage and times per day)?   Take 1 tablet (90 mg total) by mouth 2 (two) times daily.      3. Are you having a reaction (difficulty breathing--STAT)? No  4. What is your medication issue? Pt states that medication is too expensive ($1,000) and is not able to afford. He would like to know if another medication can be prescribed. Please advise

## 2022-01-02 NOTE — Telephone Encounter (Signed)
Reviewed that this was discontinued at his discharge from the hospital. Jeffery Sweeney through all of his medications and inquired about his Repatha. He states that medication is too expensive for him and wanted to know if he could restart the rosuvastatin. Advised that I will send this over to provider for her review and recommendations and that we would be in touch with that information. He verbalized understanding with no further questions at this time.

## 2022-01-02 NOTE — Telephone Encounter (Signed)
Requested Prescriptions   Signed Prescriptions Disp Refills   ticagrelor (BRILINTA) 90 MG TABS tablet 180 tablet 0    Sig: Take 1 tablet (90 mg total) by mouth 2 (two) times daily.    Authorizing Provider: HAMMOCK, SHERI    Ordering User: Kendrick Fries

## 2022-01-02 NOTE — Telephone Encounter (Signed)
Pt c/o medication issue:  1. Name of Medication: rosuvastatin   2. How are you currently taking this medication (dosage and times per day)? 1 tablet by mouth daily   3. Are you having a reaction (difficulty breathing--STAT)? no  4. What is your medication issue? Calling in to see why he was taking off of the medication

## 2022-01-03 NOTE — Telephone Encounter (Signed)
Patient will likely need indefinite dual antiplatelet therapy give extensive stenting of his LIMA-LAD in May.  Ticagrelor would be preferable, but if cost prohibitive, transition to clopidogrel could be considered.  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  Unfortunately, his history of stroke is a contraindication to prasugrel.  For now, he should continue with ticagrelor samples provided pending Dr. Jari Sportsman return and final recommendations.  Yvonne Kendall, MD [facility]

## 2022-01-03 NOTE — Telephone Encounter (Signed)
Spoke with the pt. Pt sts that he is currently out of Brlinta and cannot afford to pick up his refill. #90 would cost him $1,000. Patient is asking that Dr. Kirke Corin prescribe a cheaper alternative.  Adv the patient that Dr. Kirke Corin is out of the office this week. Adv the patient that I will leave samples of Brilinta at our front desk for him to pick up today. Adv the patient that I will fwd the update to Dr. Kirke Corin and call back with his recommendation on his return..  Patient verbalized understanding and voiced appreciation for the assistance.    Samples of this drug were given to the patient,  Brilinta quantity 2 bottles  Lot Number [id number]

## 2022-01-03 NOTE — Telephone Encounter (Signed)
Pt returning a call.

## 2022-01-03 NOTE — Telephone Encounter (Signed)
Returned the pt call. Lmtcb to discuss.

## 2022-01-08 NOTE — Telephone Encounter (Signed)
Called to make the patient aware of Dr. Jari Sportsman response and recommendation.  Unable to lmom. Pt voicemail is full.

## 2022-01-08 NOTE — Telephone Encounter (Signed)
We can switch to clopidogrel 75 mg once daily with a 300 mg loading dose on the first day.

## 2022-01-09 MED ORDER — ROSUVASTATIN CALCIUM 40 MG PO TABS: 40.0000 mg | ORAL_TABLET | Freq: Every day | ORAL | 3 refills | Status: DC

## 2022-01-09 NOTE — Telephone Encounter (Signed)
Reviewed recommendations to restart Crestor 40 mg once a day. He was grateful for a cheaper alternative.   Patient then reported he is not able to afford his Brilinta either and needs alternative for that as well. Advised that I would forward to provider for review and further recommendations. He verbalized understanding with no further questions.   Marianne Sofia, PA-C  Sent: Wed January 08, 2022  9:49 AM    Message  Yes restart Crestor 40mg  daily

## 2022-01-10 NOTE — Telephone Encounter (Signed)
I already switch him to Plavix.  Please see previous phone note.

## 2022-01-10 NOTE — Telephone Encounter (Signed)
Spoke w/ pt.  Advised him of Dr. Jari Sportsman recommendation. He states that he is having issues w/ his SS money right now and he will call back when he gets his money straightened out so we can send in rx.

## 2022-01-17 ENCOUNTER — Other Ambulatory Visit: Payer: Self-pay | Admitting: Cardiovascular Disease

## 2022-01-17 ENCOUNTER — Telehealth: Payer: Self-pay | Admitting: Cardiovascular Disease

## 2022-01-17 ENCOUNTER — Other Ambulatory Visit: Payer: Self-pay | Admitting: Student in an Organized Health Care Education/Training Program

## 2022-01-17 MED ORDER — PANTOPRAZOLE SODIUM 40 MG PO TBEC: 40.0000 mg | DELAYED_RELEASE_TABLET | Freq: Every day | ORAL | 2 refills | Status: DC

## 2022-01-17 MED ORDER — CLOPIDOGREL BISULFATE 75 MG PO TABS: 75.0000 mg | ORAL_TABLET | Freq: Every day | ORAL | 6 refills | Status: DC

## 2022-01-17 MED ORDER — METOPROLOL TARTRATE 50 MG PO TABS: 50.0000 mg | ORAL_TABLET | Freq: Two times a day (BID) | ORAL | 2 refills | Status: DC

## 2022-01-17 MED ORDER — METOPROLOL TARTRATE 50 MG PO TABS: 50.0000 mg | ORAL_TABLET | Freq: Two times a day (BID) | ORAL | 6 refills | Status: DC

## 2022-01-17 NOTE — Telephone Encounter (Signed)
Spoke w/ pt.  Advised him of our conversation last week. He reports that he got his SS check in his bank account this am and would like refills sent in on his plavix, metoprolol and protonix.  Advised him that I will send in 30 day supply per his request.  Asked him to call back w/ any further qustions or concerns.

## 2022-01-17 NOTE — Telephone Encounter (Signed)
*  STAT* If patient is at the pharmacy, call can be transferred to refill team.   1. Which medications need to be refilled? (please list name of each medication and dose if known) metoprolol tartrate (LOPRESSOR) 50 MG tablet  2. Which pharmacy/location (including street and city if local pharmacy) is medication to be sent to? [facility] - [place] ([place]), [place] - [address]  3. Do they need a 30 day or 90 day supply? 90

## 2022-01-17 NOTE — Telephone Encounter (Signed)
Pt c/o medication issue:  1. Name of Medication: Plavix   2. How are you currently taking this medication (dosage and times per day)?    3. Are you having a reaction (difficulty breathing--STAT)? no  4. What is your medication issue? Patient states he suppose to be talking this medication. But there is nothing listed. Please advise

## 2022-01-17 NOTE — Telephone Encounter (Signed)
Pt's medication was sent to pt's pharmacy as requested. Confirmation received.  °

## 2022-02-02 NOTE — Progress Notes (Deleted)
Cardiology Office Note:    Date:  02/02/2022   ID:  Jeffery Sweeney, DOB [birth year], MRN [id number]  PCP:  Wellington Hampshire, MD  [facility] Cardiologist:  None  [facility] Electrophysiologist:  None   Referring MD: Wellington Hampshire, MD   Chief Complaint: Hospital follow-up  History of Present Illness:    Jeffery Sweeney is a 61 y.o. male with a hx of self reported MI x3 (5 years prior) with subsequent PCI and CABGx3, HTN, h/o tobacco use, HLD, h/o stroke 06/2021 who is being seen for hospital follow-up.    Seen 07/14/20 for chest pain similar to prior MI. He underwent LHC that showed 3V CAD, LAD disease moderate with significant FFR/IFR ratio 0.74. Also noted severe stenosis in the bifurcating first diagnoal, second diag, and RCA. Lcx also with disease. Echo showed EF 55-60%, NR WMA, mild LVH. Recommendations for transfer to [facility] for CABG. He underwent CABG x3 (LIMA to LAD, rSVG-PDA, SVG-ramus) 2/21. ABIs normal bilaterally.    Seen in the ED 10/10/20 for chest pain, felt to be atypical. Troponin negative x2 and EKG without changes. CXR and labs unremarkable. Cardiologist was consulted, low suspicion for ACS, and he was discharged home. At follow-up in the office a Myoview Lexiscan was ordered. Myoview showed no evidence of ischemia with normal  EF.    Admitted 06/2021 for TIA. Echo showed LVEF 60-65%, no WMA, G1DD,  mild MR.   Seen in the office 09/20/21 for chest pain and he was set up for a cardiac cath. LHC showed underlying 2V CAD with significant disease in the circumflex that was not revascularized. He also had an 80% ostial stenosis of the LIMA to LAD. There was loss of flow in the LIMA after diagnostic angiography thought to be due to disruption of plaque or vessel closure. He underwent successful DES placement and aspiration thrombectomy of the LIMA to LAD with restoration of TIMI-3 flow. He did have residual thrombus and was started on Aggrastat.  There was the appearance of  what seems to be dissection in the LIMA but it was nonflow limiting and medical therapy was recommended.  The patient was chest pain-free and continued to do well for about 6 hours but around 6 PM, he started having recurrent chest pain with elevated blood pressure, repeat EKG showed few millimeters of anterolateral ST elevation. He was taken back to the cath lab for relook angiography and possible PCI. Emergent cardiac catheterization was done via the right femoral artery which confirmed this.  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  The patient left the Cath Lab chest pain-free.  He was switched from clopidogrel to ticagrelor which should be continued indefinitely if possible. Echo showed LVEF 50-55%, mild LVH, trivial MR. He was discharged in stable condition.   He was admitted 12/2021 due to sepsis with bacteremia, multifocal PNA, and possible acute cholecystitis. He was treated medically.   Today,     Past Medical History:  Diagnosis Date   CVA (cerebral vascular accident) ([place]) 06/2021   Small acute/subacute infarcts in the left occipital lobe and   Hyperlipidemia LDL goal <70    started on Repatha 09/26/2021, continue Crestor 40mg  daily   Hypertension    Multiple vessel coronary artery disease    Myocardial infarct ([place])    x 3; PCI with single stent placement for each (in [place])   NSTEMI (  non-ST elevated myocardial infarction) ([place])    [place] 2/18 with multivessel CAD and transfer to [facility] for CABG eval   STEMI (ST elevation myocardial infarction) ([place]) 09/23/2021   LHC 09/23/2021 x 2   Tobacco use    2 cigarettes daily   Unstable angina ([place])    LHC 09/23/2021 x 2; total 8 DES to LIMA    Past Surgical History:  Procedure Laterality Date   CORONARY ANGIOPLASTY WITH STENT PLACEMENT     in [place] in 2018-19   [place] N/A 07/09/2020   Procedure:  CORONARY ARTERY BYPASS GRAFTING (CABG) x THREE , USING LEFT INTERNAL MAMMARY ARTERY, AND RIGHT LEG GREATER SAPHENOUS VEIN HARVESTED ENDOSCOPICALLY;  Surgeon: Grace Isaac, MD;  Location: [place];  Service: Open Heart Surgery;  Laterality: N/A;   CORONARY STENT INTERVENTION N/A 09/23/2021   Procedure: CORONARY STENT INTERVENTION;  Surgeon: Wellington Hampshire, MD;  Location: [place] [facility];  Service: Cardiovascular;  Laterality: N/A;   CORONARY/GRAFT ACUTE MI REVASCULARIZATION N/A 09/23/2021   Procedure: Coronary/Graft Acute MI Revascularization;  Surgeon: Wellington Hampshire, MD;  Location: [place] [facility];  Service: Cardiovascular;  Laterality: N/A;   INTRAVASCULAR PRESSURE WIRE/FFR STUDY N/A 07/06/2020   Procedure: INTRAVASCULAR PRESSURE WIRE/FFR STUDY;  Surgeon: Wellington Hampshire, MD;  Location: [place] [facility];  Service: Cardiovascular;  Laterality: N/A;  LAD   LEFT HEART CATH AND CORONARY ANGIOGRAPHY N/A 07/06/2020   Procedure: LEFT HEART CATH AND CORONARY ANGIOGRAPHY;  Surgeon: Wellington Hampshire, MD;  Location: [place] [facility];  Service: Cardiovascular;  Laterality: N/A;   LEFT HEART CATH AND CORONARY ANGIOGRAPHY N/A 09/23/2021   Procedure: LEFT HEART CATH AND CORONARY ANGIOGRAPHY;  Surgeon: Wellington Hampshire, MD;  Location: [place] [facility];  Service: Cardiovascular;  Laterality: N/A;   LEFT HEART CATH AND CORS/GRAFTS ANGIOGRAPHY N/A 09/23/2021   Procedure: LEFT HEART CATH AND CORS/GRAFTS ANGIOGRAPHY;  Surgeon: Wellington Hampshire, MD;  Location: [place] [facility];  Service: Cardiovascular;  Laterality: N/A;   TEE WITHOUT CARDIOVERSION N/A 07/09/2020   Procedure: TRANSESOPHAGEAL ECHOCARDIOGRAM (TEE);  Surgeon: Grace Isaac, MD;  Location: [place];  Service: Open Heart Surgery;  Laterality: N/A;   WRIST SURGERY Left    for cyst    Current Medications: No outpatient medications have been marked as taking for the 02/03/22 encounter (Appointment) with Kathlen Mody, Jamiyla Ishee H, PA-C.      Allergies:   Patient has no known allergies.   Social History   Socioeconomic History   Marital status: Divorced    Spouse name: Not on file   Number of children: Not on file   Years of education: Not on file   Highest education level: Not on file  Occupational History   Not on file  Tobacco Use   Smoking status: Former    Packs/day: 0.50    Types: Cigarettes    Quit date: 07/06/2020    Years since quitting: 1.5   Smokeless tobacco: Never  Vaping Use   Vaping Use: Never used  Substance and Sexual Activity   Alcohol use: Not Currently   Drug use: Not Currently   Sexual activity: Not on file  Other Topics Concern   Not on file  Social History Narrative   Not on file   Social Determinants of Health   Financial Resource Strain: Not on file  Food Insecurity: Not on file  Transportation Needs: Not on file  Physical Activity: Not on file  Stress: Not  on file  Social Connections: Not on file     Family History: The patient's ***family history is not on file.  ROS:   Please see the history of present illness.    *** All other systems reviewed and are negative.  EKGs/Labs/Other Studies Reviewed:    The following studies were reviewed today: ***  EKG:  EKG is *** ordered today.  The ekg ordered today demonstrates ***  Recent Labs: 12/29/2021: Magnesium 2.1 01/01/2022: ALT 69; BUN 15; Creatinine, Ser 0.86; Hemoglobin 13.0; Platelets 177; Potassium 3.7; Sodium 136  Recent Lipid Panel    Component Value Date/Time   CHOL 99 12/29/2021 0551   TRIG 95 12/29/2021 0551   HDL 34 (L) 12/29/2021 0551   CHOLHDL 2.9 12/29/2021 0551   VLDL 19 12/29/2021 0551   LDLCALC 46 12/29/2021 0551     Risk Assessment/Calculations:   {Does this patient have ATRIAL FIBRILLATION?:[phone]}   Physical Exam:    VS:  There were no vitals taken for this visit.    Wt Readings from Last 3 Encounters:  12/29/21 193 lb 14.4 oz (88 kg)  12/27/21 192 lb (87.1 kg)  10/11/21 191 lb  6.4 oz (86.8 kg)     GEN: *** Well nourished, well developed in no acute distress HEENT: Normal NECK: No JVD; No carotid bruits LYMPHATICS: No lymphadenopathy CARDIAC: ***RRR, no murmurs, rubs, gallops RESPIRATORY:  Clear to auscultation without rales, wheezing or rhonchi  ABDOMEN: Soft, non-tender, non-distended MUSCULOSKELETAL:  No edema; No deformity  SKIN: Warm and dry NEUROLOGIC:  Alert and oriented x 3 PSYCHIATRIC:  Normal affect   ASSESSMENT:    No diagnosis found. PLAN:    In order of problems listed above:  CABG s/p CABG and subsequent stenting  HTN  HLD  H/o stroke 06/2021  Disposition: Follow up {follow up:15908} with ***   Shared Decision Making/Informed Consent   {Are you ordering a CV Procedure (e.g. stress test, cath, DCCV, TEE, etc)?   Press F2        :[id number]}    Signed, Lenise Jr Ninfa Meeker, PA-C  02/02/2022 9:12 PM    [facility]

## 2022-02-03 ENCOUNTER — Ambulatory Visit: Payer: Self-pay | Attending: Medical | Admitting: Medical

## 2022-04-02 ENCOUNTER — Telehealth: Payer: Self-pay | Admitting: Cardiovascular Disease

## 2022-04-02 NOTE — Telephone Encounter (Signed)
Patient stated he is having tenderness/soreness on his left side at the nipple area.  Patient is concern this may be heart related.

## 2022-04-02 NOTE — Telephone Encounter (Signed)
I called and spoke with the patient.  He states that x 2 weeks he has been having soreness around his left nipple area.  -He confirms this does feel worse if he presses on it, but symptoms have been stable over the last 2 weeks.  -He also reports if laying down, his chest feels tight in the same area. -He denies any redness or heat to the area.  The patient is s/p CABG 07/13/20. -He states he has been "numb" in that area of his chest until recently.  I inquired if he has been doing any pushing/ pulling/ lifting over the last 2 weeks or around the time his symptoms started. 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  -He did try some Aleve over the last 2 weeks without relief (unclear how many doses he tried).   He also reports that he will get chest tightness intermittently during the day that is relieved with belching. - He also has to drink water for relief as it feels like something is getting "hung."  - I inquired if he has ever been diagnosed with a hiatal hernia and he confirms he has not.  - However, he feels like his protonix is not working.  The patient does not have a PCP.  I have advised him that his symptoms of nipple soreness sound to be more musculoskeletal in nature/ nerve involvement from his previous CABG.  I have asked him to have to a trial of tylenol for a couple of days to see if this helps since NSAIDS as not recommended with his ASA/ Plavix I have also advised him that chest tightness relieved with belching is more GI in nature Since his does not currently have a PCP, will forward to Dr. Kirke Corin for any further recommendations at this time.  The patient is aware we will follow up with him after we hear back from Dr. Kirke Corin.  He voices understanding and is agreeable.

## 2022-04-03 ENCOUNTER — Ambulatory Visit: Payer: Self-pay | Attending: Cardiovascular Disease | Admitting: Cardiovascular Disease

## 2022-04-03 ENCOUNTER — Encounter: Payer: Self-pay | Admitting: Cardiovascular Disease

## 2022-04-03 ENCOUNTER — Encounter: Payer: Self-pay | Admitting: *Deleted

## 2022-04-03 VITALS — BP 148/90 | HR 78 | Ht 63.0 in | Wt 201.5 lb

## 2022-04-03 DIAGNOSIS — I10 Essential (primary) hypertension: Secondary | ICD-10-CM

## 2022-04-03 DIAGNOSIS — R072 Precordial pain: Secondary | ICD-10-CM

## 2022-04-03 DIAGNOSIS — N644 Mastodynia: Secondary | ICD-10-CM

## 2022-04-03 DIAGNOSIS — I25118 Atherosclerotic heart disease of native coronary artery with other forms of angina pectoris: Secondary | ICD-10-CM

## 2022-04-03 DIAGNOSIS — E782 Mixed hyperlipidemia: Secondary | ICD-10-CM

## 2022-04-03 MED ORDER — ISOSORBIDE MONONITRATE ER 30 MG PO TB24: 30.0000 mg | ORAL_TABLET | Freq: Every day | ORAL | 1 refills | Status: DC

## 2022-04-03 NOTE — Telephone Encounter (Signed)
I agree that his symptoms are overall atypical.  Some of his symptoms are clearly musculoskeletal but the chest tightness is concerning.  This patient has extensive cardiac history and we should see him in the office for evaluation.

## 2022-04-03 NOTE — Telephone Encounter (Signed)
Attempted to reach the patient to see if he could come in today to see Dr. Kirke Corin. Was unable to leave a message.

## 2022-04-03 NOTE — Patient Instructions (Signed)
Medication Instructions:  START Imdur 30 mg once daily  *If you need a refill on your cardiac medications before your next appointment, please call your pharmacy*   Lab Work: None ordered If you have labs (blood work) drawn today and your tests are completely normal, you will receive your results only by: MyChart Message (if you have MyChart) OR A paper copy in the mail If you have any lab test that is abnormal or we need to change your treatment, we will call you to review the results.   Testing/Procedures: Your provider has ordered a Lexiscan Myoview Stress test. This will take place at [facility]. Please report to the [facility] medical mall entrance. The volunteers at the first desk will direct you where to go.  [facility] MYOVIEW  Your provider has ordered a Stress Test with nuclear imaging. The purpose of this test is to evaluate the blood supply to your heart muscle. This procedure is referred to as a "Non-Invasive Stress Test." This is because other than having an IV started in your vein, nothing is inserted or "invades" your body. Cardiac stress tests are done to find areas of poor blood flow to the heart by determining the extent of coronary artery disease (CAD). Some patients exercise on a treadmill, which naturally increases the blood flow to your heart, while others who are unable to walk on a treadmill due to physical limitations will have a pharmacologic/chemical stress agent called Lexiscan . This medicine will mimic walking on a treadmill by temporarily increasing your coronary blood flow.   Please note: these test may take anywhere between 2-4 hours to complete  How to prepare for your Myoview test:  Do not eat or drink after midnight No caffeine for 24 hours prior to test No smoking 24 hours prior to test. Your medication may be taken with water.  If your doctor stopped a medication because of this test, do not take that medication. Ladies, please do not wear dresses.  Skirts or pants  are appropriate. Please wear a short sleeve shirt. No perfume, cologne or lotion. Wear comfortable walking shoes.    PLEASE NOTIFY THE OFFICE AT LEAST 24 HOURS IN ADVANCE IF YOU ARE UNABLE TO KEEP YOUR APPOINTMENT.  [phone] AND  PLEASE NOTIFY NUCLEAR MEDICINE AT [facility] AT LEAST 24 HOURS IN ADVANCE IF YOU ARE UNABLE TO KEEP YOUR APPOINTMENT. [phone]    Follow-Up: At [name], you and your health needs are our priority.  As part of our continuing mission to provide you with exceptional heart care, we have created designated Provider Care Teams.  These Care Teams include your primary Cardiologist (physician) and Advanced Practice Providers (APPs -  Physician Assistants and Nurse Practitioners) who all work together to provide you with the care you need, when you need it.  We recommend signing up for the patient portal called "MyChart".  Sign up information is provided on this After Visit Summary.  MyChart is used to connect with patients for Virtual Visits (Telemedicine).  Patients are able to view lab/test results, encounter notes, upcoming appointments, etc.  Non-urgent messages can be sent to your provider as well.   To learn more about what you can do with MyChart, go to [email or web address].    Your next appointment:   1 month(s)  The format for your next appointment:   In Person  Provider:   You may see Dr. Kirke Corin or one of the following Advanced Practice Providers on your designated Care Team:   Nicolasa Ducking,  NP Eula Listen, PA-C Cadence Fransico Michael, PA-C Charlsie Quest, NP    Important Information About Sugar

## 2022-04-03 NOTE — Progress Notes (Signed)
Cardiology Office Note   Date:  04/03/2022   ID:  Jeffery Sweeney, DOB 10-21-1960, MRN [id number]  PCP:  Iran Ouch, MD  Cardiologist:   Lorine Bears, MD   Chief Complaint  Patient presents with   OTHER    Chest tightness/discomfort. Meds reviewed verbally with pt.      History of Present Illness: Jeffery Sweeney is a 61 y.o. male who presents for a follow-up visit regarding coronary artery disease.  He has known history of essential hypertension, tobacco use and hyperlipidemia. The patient had previous PCI in [place] in 2019.  He presented in February of 2022  with unstable angina.  Cardiac catheterization showed significant three-vessel coronary artery disease.  Echo showed normal LV systolic function.  The patient underwent CABG x3 with LIMA to LAD, SVG to right PDA and SVG to ramus.  He was seen in May for recurrent anginal symptoms.  He underwent cardiac catheterization which showed severe underlying three-vessel coronary artery disease with patent grafts including LIMA to LAD, SVG to diagonal and SVG to right PDA.  The left circumflex had significant obstructive disease and was not bypassed due to small vessel size.  His LIMA to LAD had an 80% still stenosis with hazy appearance.  When the LIMA was engaged, there was acute closure and the graft.  I performed emergent aspiration thrombectomy and drug-eluting stent placement.  He was placed on Aggrastat but had recurrent chest pain and EKG changes after about 6 hours.  Repeat emergent cardiac cath showed acute closure of LIMA to LAD beyond the previously stented segment due to what seemed to be spiral dissection throughout the whole graft to that the stenosis.  This was treated successfully with aspiration thrombectomy and 7 overlapped drug-eluting stents to cover the whole length of the LIMA into the native LAD just beyond the anastomosis.  He was subsequently switched from Brilinta to clopidogrel due to cost. He was  hospitalized in August after he presented with chest pain, cough and shortness of breath.  He was noted to be febrile and tachycardic.  He was found to have pneumonia and Enterobacter bacteremia from unclear source.  He improved with antibiotics.  He continues to work part-time at [employer].  He usually has to carry boxes that are about 20 pounds.  He reports substernal chest tightness that started about 2 weeks ago.  The pain is usually exertional and resolves with rest.  In addition, he complains of tenderness in the left nipple with radiation of the pain to the left shoulder.  Past Medical History:  Diagnosis Date   CVA (cerebral vascular accident) (HCC) 06/2021   Small acute/subacute infarcts in the left occipital lobe and   Hyperlipidemia LDL goal <70    started on Repatha 09/26/2021, continue Crestor 40mg  daily   Hypertension    Multiple vessel coronary artery disease    Myocardial infarct (HCC)    x 3; PCI with single stent placement for each (in )   NSTEMI (non-ST elevated myocardial infarction) (HCC)    LHC 2/18 with multivessel CAD and transfer to [facility] for CABG eval   STEMI (ST elevation myocardial infarction) (HCC) 09/23/2021   LHC 09/23/2021 x 2   Tobacco use    2 cigarettes daily   Unstable angina (HCC)    LHC 09/23/2021 x 2; total 8 DES to LIMA    Past Surgical History:  Procedure Laterality Date   CORONARY ANGIOPLASTY WITH STENT PLACEMENT     in  [place] in 2018-19   CORONARY ARTERY BYPASS GRAFT N/A 07/09/2020   Procedure: CORONARY ARTERY BYPASS GRAFTING (CABG) x THREE , USING LEFT INTERNAL MAMMARY ARTERY, AND RIGHT LEG GREATER SAPHENOUS VEIN HARVESTED ENDOSCOPICALLY;  Surgeon: Delight Ovens, MD;  Location: [facility] OR;  Service: Open Heart Surgery;  Laterality: N/A;   CORONARY STENT INTERVENTION N/A 09/23/2021   Procedure: CORONARY STENT INTERVENTION;  Surgeon: Iran Ouch, MD;  Location: [facility] INVASIVE CV LAB;  Service: Cardiovascular;  Laterality: N/A;    CORONARY/GRAFT ACUTE MI REVASCULARIZATION N/A 09/23/2021   Procedure: Coronary/Graft Acute MI Revascularization;  Surgeon: Iran Ouch, MD;  Location: [facility] INVASIVE CV LAB;  Service: Cardiovascular;  Laterality: N/A;   INTRAVASCULAR PRESSURE WIRE/FFR STUDY N/A 07/06/2020   Procedure: INTRAVASCULAR PRESSURE WIRE/FFR STUDY;  Surgeon: Iran Ouch, MD;  Location: [facility] INVASIVE CV LAB;  Service: Cardiovascular;  Laterality: N/A;  LAD   LEFT HEART CATH AND CORONARY ANGIOGRAPHY N/A 07/06/2020   Procedure: LEFT HEART CATH AND CORONARY ANGIOGRAPHY;  Surgeon: Iran Ouch, MD;  Location: [facility] INVASIVE CV LAB;  Service: Cardiovascular;  Laterality: N/A;   LEFT HEART CATH AND CORONARY ANGIOGRAPHY N/A 09/23/2021   Procedure: LEFT HEART CATH AND CORONARY ANGIOGRAPHY;  Surgeon: Iran Ouch, MD;  Location: [facility] INVASIVE CV LAB;  Service: Cardiovascular;  Laterality: N/A;   LEFT HEART CATH AND CORS/GRAFTS ANGIOGRAPHY N/A 09/23/2021   Procedure: LEFT HEART CATH AND CORS/GRAFTS ANGIOGRAPHY;  Surgeon: Iran Ouch, MD;  Location: [facility] INVASIVE CV LAB;  Service: Cardiovascular;  Laterality: N/A;   TEE WITHOUT CARDIOVERSION N/A 07/09/2020   Procedure: TRANSESOPHAGEAL ECHOCARDIOGRAM (TEE);  Surgeon: Delight Ovens, MD;  Location: [facility] OR;  Service: Open Heart Surgery;  Laterality: N/A;   WRIST SURGERY Left    for cyst     Current Outpatient Medications  Medication Sig Dispense Refill   aspirin 81 MG chewable tablet Chew 1 tablet (81 mg total) by mouth daily.     calcium carbonate (TUMS - DOSED IN MG ELEMENTAL CALCIUM) 500 MG chewable tablet Chew 1 tablet (200 mg of elemental calcium total) by mouth 2 (two) times daily as needed for heartburn.     clopidogrel (PLAVIX) 75 MG tablet Take 1 tablet by mouth once daily 90 tablet 0   Evolocumab (REPATHA SURECLICK) 140 MG/ML SOAJ Inject 140 mg into the skin every 14 (fourteen) days. 2 mL 0   meclizine (ANTIVERT) 25 MG tablet Take 1 tablet (25 mg  total) by mouth 3 (three) times daily as needed for dizziness. 30 tablet 0   metoprolol tartrate (LOPRESSOR) 50 MG tablet Take 1 tablet (50 mg total) by mouth 2 (two) times daily. 60 tablet 6   nitroGLYCERIN (NITROSTAT) 0.4 MG SL tablet Place 1 tablet (0.4 mg total) under the tongue every 5 (five) minutes as needed for chest pain. 25 tablet 3   pantoprazole (PROTONIX) 40 MG tablet Take 1 tablet (40 mg total) by mouth once daily. 30 tablet 2   rosuvastatin (CRESTOR) 40 MG tablet Take 1 tablet (40 mg total) by mouth daily. 90 tablet 3   No current facility-administered medications for this visit.    Allergies:   Patient has no known allergies.    Social History:  The patient  reports that he has been smoking cigarettes. He has been smoking an average of .5 packs per day. He has never used smokeless tobacco. He reports that he does not currently use alcohol. He reports that he does not currently use drugs.  Family History:  The patient's family history is not on file.    ROS:  Please see the history of present illness.   Otherwise, review of systems are positive for .   All other systems are reviewed and negative.    PHYSICAL EXAM: VS:  BP (!) 148/90 (BP Location: Left Arm, Patient Position: Sitting, Cuff Size: Normal)   Pulse 78   Ht 5\' 3"  (1.6 m)   Wt 201 lb 8 oz (91.4 kg)   SpO2 98%   BMI 35.69 kg/m  , BMI Body mass index is 35.69 kg/m. GEN: Well nourished, well developed, in no acute distress  HEENT: normal  Neck: no JVD, carotid bruits, or masses Cardiac: RRR; no murmurs, rubs, or gallops,no edema  Respiratory:  clear to auscultation bilaterally, normal work of breathing GI: soft, nontender, nondistended, + BS MS: no deformity or atrophy  Skin: warm and dry, no rash Neuro:  Strength and sensation are intact Psych: euthymic mood, full affect    EKG:  EKG is ordered today. EKG showed normal sinus rhythm with no significant ST or T wave changes.   Recent  Labs: 12/29/2021: Magnesium 2.1 01/01/2022: ALT 69; BUN 15; Creatinine, Ser 0.86; Hemoglobin 13.0; Platelets 177; Potassium 3.7; Sodium 136    Lipid Panel    Component Value Date/Time   CHOL 99 12/29/2021 0551   TRIG 95 12/29/2021 0551   HDL 34 (L) 12/29/2021 0551   CHOLHDL 2.9 12/29/2021 0551   VLDL 19 12/29/2021 0551   LDLCALC 46 12/29/2021 0551      Wt Readings from Last 3 Encounters:  04/03/22 201 lb 8 oz (91.4 kg)  12/29/21 193 lb 14.4 oz (88 kg)  12/27/21 192 lb (87.1 kg)           No data to display            ASSESSMENT AND PLAN:  1.  Coronary artery disease involving native coronary arteries with other forms of angina: Some of his symptoms are clearly musculoskeletal.  However, the exertional chest tightness is concerning especially with his known extensive cardiac history and multiple overlapped stents in the LIMA to LAD.  Fortunately, his EKG does not show ischemic changes.  I requested a Lexiscan Myoview.  I added Imdur 30 mg once daily.    2.  Essential hypertension: His blood pressure is elevated.  I added Imdur.  3.  Hyperlipidemia: Continue high-dose rosuvastatin.  Most recent lipid profile showed an LDL of 46.  4.  Left nipple tenderness: No clear or mass by exam.  Symptoms could be musculoskeletal.  If no improvement after 1 month, recommend an ultrasound.   Disposition: Schedule Lexiscan Myoview and follow-up in 1 month.  Signed,  02/26/22, MD  04/03/2022 4:03 PM    [facility]

## 2022-04-03 NOTE — Telephone Encounter (Signed)
Spoke to the patient. Appointment made for today at 4 with Dr. Kirke Corin

## 2022-04-03 NOTE — Telephone Encounter (Signed)
Attempted to call the patient.  No answer & no voice mail available.

## 2022-04-09 ENCOUNTER — Encounter
Admission: RE | Admit: 2022-04-09 | Discharge: 2022-04-09 | Disposition: A | Discharge status: Home / Self Care | Payer: Self-pay | Source: Ambulatory Visit | Attending: Cardiovascular Disease | Admitting: Cardiovascular Disease

## 2022-04-09 DIAGNOSIS — R072 Precordial pain: Secondary | ICD-10-CM | POA: Insufficient documentation

## 2022-04-09 LAB — NM MYOCAR MULTI W/SPECT W/WALL MOTION / EF
Estimated workload: 1
Exercise duration (min): 0 min
Exercise duration (sec): 0 s
LV dias vol: 58 mL (ref 62–150)
LV sys vol: 17 mL
MPHR: 158 {beats}/min
Nuc Stress EF: 71 %
Peak HR: 97 {beats}/min
Percent HR: 61 %
Rest HR: 66 {beats}/min
Rest Nuclear Isotope Dose: 10.7 mCi
SDS: 0
SRS: 0
SSS: 0
ST Depression (mm): 0 mm
Stress Nuclear Isotope Dose: 29.1 mCi
TID: 1.05

## 2022-04-09 MED ORDER — REGADENOSON 0.4 MG/5ML IV SOLN
0.4000 mg | Freq: Once | INTRAVENOUS | Status: AC
  Administered 2022-04-09: 0.4 mg via INTRAVENOUS
  Filled 2022-04-09: qty 5

## 2022-04-09 MED ORDER — TECHNETIUM TC 99M TETROFOSMIN IV KIT
29.1000 | PACK | Freq: Once | INTRAVENOUS | Status: AC | PRN
  Administered 2022-04-09: 29.1 via INTRAVENOUS

## 2022-04-09 MED ORDER — TECHNETIUM TC 99M TETROFOSMIN IV KIT
10.0000 | PACK | Freq: Once | INTRAVENOUS | Status: AC | PRN
  Administered 2022-04-09: 10.66 via INTRAVENOUS

## 2022-04-16 ENCOUNTER — Telehealth: Payer: Self-pay | Admitting: Cardiovascular Disease

## 2022-04-16 MED ORDER — CLOPIDOGREL BISULFATE 75 MG PO TABS: 75.0000 mg | ORAL_TABLET | Freq: Every day | ORAL | 3 refills | Status: DC

## 2022-04-16 MED ORDER — METOPROLOL TARTRATE 50 MG PO TABS: 50.0000 mg | ORAL_TABLET | Freq: Two times a day (BID) | ORAL | 3 refills | Status: DC

## 2022-04-16 MED ORDER — PANTOPRAZOLE SODIUM 40 MG PO TBEC: 40.0000 mg | DELAYED_RELEASE_TABLET | Freq: Every day | ORAL | 3 refills | Status: DC

## 2022-04-16 NOTE — Telephone Encounter (Signed)
Patient is returning RN's call regarding stress test results. Please advise.

## 2022-04-16 NOTE — Telephone Encounter (Signed)
Called patient back and informed him of the following result note:   Jeffery Ouch, MD 04/09/2022  5:06 PM EST     Inform patient that  stress test was normal.   Patient also requested a refill on his protonix, plavix, and metoprolol. Stated he had 1 pill left of each.  Sent prescriptions in as requested.

## 2022-04-30 NOTE — Progress Notes (Signed)
Cardiology Clinic Note   Patient Name: Jeffery Sweeney Date of Encounter: 05/02/2022  Primary Care Provider:  Patient, No Pcp Per Primary Cardiologist:  Lorine Bears, MD  Patient Profile    61 year old male with a past medical history of essential hypertension, tobacco use, hyperlipidemia, coronary artery disease status post CABG, CVA, who is here today for follow-up of his coronary artery disease.  Past Medical History    Past Medical History:  Diagnosis Date   CVA (cerebral vascular accident) (HCC) 06/2021   Small acute/subacute infarcts in the left occipital lobe and   Hyperlipidemia LDL goal <70    started on Repatha 09/26/2021, continue Crestor 40mg  daily   Hypertension    Multiple vessel coronary artery disease    Myocardial infarct (HCC)    x 3; PCI with single stent placement for each (in )   NSTEMI (non-ST elevated myocardial infarction) (HCC)    LHC 2/18 with multivessel CAD and transfer to [facility] for CABG eval   STEMI (ST elevation myocardial infarction) (HCC) 09/23/2021   LHC 09/23/2021 x 2   Tobacco use    2 cigarettes daily   Unstable angina (HCC)    LHC 09/23/2021 x 2; total 8 DES to LIMA   Past Surgical History:  Procedure Laterality Date   CORONARY ANGIOPLASTY WITH STENT PLACEMENT     in 11/23/2021 in 2018-19   CORONARY ARTERY BYPASS GRAFT N/A 07/09/2020   Procedure: CORONARY ARTERY BYPASS GRAFTING (CABG) x THREE , USING LEFT INTERNAL MAMMARY ARTERY, AND RIGHT LEG GREATER SAPHENOUS VEIN HARVESTED ENDOSCOPICALLY;  Surgeon: 07/11/2020, MD;  Location: [facility] OR;  Service: Open Heart Surgery;  Laterality: N/A;   CORONARY STENT INTERVENTION N/A 09/23/2021   Procedure: CORONARY STENT INTERVENTION;  Surgeon: 11/23/2021, MD;  Location: [facility] INVASIVE CV LAB;  Service: Cardiovascular;  Laterality: N/A;   CORONARY/GRAFT ACUTE MI REVASCULARIZATION N/A 09/23/2021   Procedure: Coronary/Graft Acute MI Revascularization;  Surgeon: 11/23/2021, MD;   Location: [facility] INVASIVE CV LAB;  Service: Cardiovascular;  Laterality: N/A;   INTRAVASCULAR PRESSURE WIRE/FFR STUDY N/A 07/06/2020   Procedure: INTRAVASCULAR PRESSURE WIRE/FFR STUDY;  Surgeon: 07/08/2020, MD;  Location: [facility] INVASIVE CV LAB;  Service: Cardiovascular;  Laterality: N/A;  LAD   LEFT HEART CATH AND CORONARY ANGIOGRAPHY N/A 07/06/2020   Procedure: LEFT HEART CATH AND CORONARY ANGIOGRAPHY;  Surgeon: 07/08/2020, MD;  Location: [facility] INVASIVE CV LAB;  Service: Cardiovascular;  Laterality: N/A;   LEFT HEART CATH AND CORONARY ANGIOGRAPHY N/A 09/23/2021   Procedure: LEFT HEART CATH AND CORONARY ANGIOGRAPHY;  Surgeon: 11/23/2021, MD;  Location: [facility] INVASIVE CV LAB;  Service: Cardiovascular;  Laterality: N/A;   LEFT HEART CATH AND CORS/GRAFTS ANGIOGRAPHY N/A 09/23/2021   Procedure: LEFT HEART CATH AND CORS/GRAFTS ANGIOGRAPHY;  Surgeon: 11/23/2021, MD;  Location: [facility] INVASIVE CV LAB;  Service: Cardiovascular;  Laterality: N/A;   TEE WITHOUT CARDIOVERSION N/A 07/09/2020   Procedure: TRANSESOPHAGEAL ECHOCARDIOGRAM (TEE);  Surgeon: 07/11/2020, MD;  Location: [facility] OR;  Service: Open Heart Surgery;  Laterality: N/A;   WRIST SURGERY Left    for cyst    Allergies  No Known Allergies  History of Present Illness    Jeffery Sweeney is a 61 year old male with previously mentioned past medical history of essential hypertension, tobacco use, hyperlipidemia, previous PCI 68 in 2019.  He had presented in February 2022 with unstable angina.  Cardiac catheterization showed significant three-vessel coronary artery disease.  Echocardiogram showed normal LV systolic function.  He underwent CABG x 3 vessels (LIMA to LAD, SVG to right PDA, and SVG to ramus).  He was evaluated in May for recurrent anginal symptoms and underwent cardiac catheterization which showed severe underlying three-vessel coronary artery disease with patent grafts.  The left circumflex had significant  obstructive disease and was not bypassed due to small vessel size.  He had an emergent aspiration thrombectomy and drug-eluting stent placement.  He was placed on Aggrastat but had recurrent chest pain and EKG changes after approximately 6 hours and underwent repeat emergent cardiac cath which showed acute closure of the LIMA to LAD graft beyond the previously stented segment due to what seems to be spiral dissection throughout the whole graft to that stenosis.  This was treated successfully with aspiration thrombectomy and 7 overlapping drug-eluting stents to cover the whole length of the LIMA into the native LAD just beyond the anastomosis.  He was switched from Brilinta to clopidogrel due to cost.  He was then again hospitalized in August after he presented with chest pain, cough, and shortness of breath.  He was noted to be febrile and tachycardic and was found to have pneumonia and Enterobacter bacteremia from unclear source.  He was treated with antibiotics and had improvement in symptoms.  Echocardiogram completed 09/24/2021 revealed LVEF of 50-55%, demonstrated regional wall motion abnormalities, mild left ventricular hypertrophy, trivial mitral regurgitation, with aortic valve sclerosis without evidence of stenosis.  Repeat echo done 10/02/2021 revealed LVEF of 55-60%, no regional wall motion abnormalities, mild left ventricular hypertrophy, G1 DD, aortic valve sclerosis without stenosis.  He was last seen in clinic on 04/03/2022 by Dr. Kirke CorinArida with complaints of chest tightness and discomfort.  He stated that the pain was substernal and started about 2 weeks prior.  It usually exertional and resolved with rest in addition to tenderness of the left nipple with radiation of the pain into the left shoulder.  [facility] and started on Imdur 30 mg daily.  Lexiscan MPI revealed normal LV perfusion with stress EF of 71%, findings are consistent with no prior ischemia in the study is  considered low risk.  He returns to clinic today stating that overall he has been doing fairly well. He denies any chest pain with the initiation of Imdur previously, but has continued to have left nipple pain with radiation just into the left shoulder.  He denies any shortness of breath, palpitations, or peripheral edema.  He continues to work part-time at [employer].  Denies any recent hospitalizations or visits to the emergency department.  Home Medications    Current Outpatient Medications  Medication Sig Dispense Refill   aspirin 81 MG chewable tablet Chew 1 tablet (81 mg total) by mouth daily.     calcium carbonate (TUMS - DOSED IN MG ELEMENTAL CALCIUM) 500 MG chewable tablet Chew 1 tablet (200 mg of elemental calcium total) by mouth 2 (two) times daily as needed for heartburn.     clopidogrel (PLAVIX) 75 MG tablet Take 1 tablet (75 mg total) by mouth daily. 30 tablet 3   Evolocumab (REPATHA SURECLICK) 140 MG/ML SOAJ Inject 140 mg into the skin every 14 (fourteen) days. 2 mL 0   isosorbide mononitrate (IMDUR) 30 MG 24 hr tablet Take 1 tablet (30 mg total) by mouth daily. 90 tablet 1   meclizine (ANTIVERT) 25 MG tablet Take 1 tablet (25 mg total) by mouth 3 (three) times daily as needed for dizziness.  30 tablet 0   metoprolol tartrate (LOPRESSOR) 50 MG tablet Take 1 tablet (50 mg total) by mouth 2 (two) times daily. 60 tablet 3   nitroGLYCERIN (NITROSTAT) 0.4 MG SL tablet Place 1 tablet (0.4 mg total) under the tongue every 5 (five) minutes as needed for chest pain. 25 tablet 3   pantoprazole (PROTONIX) 40 MG tablet Take 1 tablet (40 mg total) by mouth once daily. 30 tablet 3   rosuvastatin (CRESTOR) 40 MG tablet Take 1 tablet (40 mg total) by mouth daily. 90 tablet 0   No current facility-administered medications for this visit.     Family History    History reviewed. No pertinent family history. He indicated that his mother is deceased. He indicated that his father is  alive.  Social History    Social History   Socioeconomic History   Marital status: Divorced    Spouse name: Not on file   Number of children: Not on file   Years of education: Not on file   Highest education level: Not on file  Occupational History   Not on file  Tobacco Use   Smoking status: Some Days    Packs/day: 0.50    Types: Cigarettes    Last attempt to quit: 07/06/2020    Years since quitting: 1.8   Smokeless tobacco: Never  Vaping Use   Vaping Use: Never used  Substance and Sexual Activity   Alcohol use: Not Currently   Drug use: Not Currently   Sexual activity: Not on file  Other Topics Concern   Not on file  Social History Narrative   Not on file   Social Determinants of Health   Financial Resource Strain: Not on file  Food Insecurity: Not on file  Transportation Needs: Not on file  Physical Activity: Not on file  Stress: Not on file  Social Connections: Not on file  Intimate Partner Violence: Not on file     Review of Systems    General:  No chills, fever, night sweats or weight changes.  Cardiovascular:  No chest pain, dyspnea on exertion, edema, orthopnea, palpitations, paroxysmal nocturnal dyspnea. Endorses left nipple pain that radiates into the left shoulder Dermatological: No rash, lesions/masses Respiratory: No cough, dyspnea Urologic: No hematuria, dysuria Abdominal:   No nausea, vomiting, diarrhea, bright red blood per rectum, melena, or hematemesis Neurologic:  No visual changes, wkns, changes in mental status. All other systems reviewed and are otherwise negative except as noted above.     Physical Exam    VS:  BP 136/86 (BP Location: Left Arm, Patient Position: Sitting, Cuff Size: Normal)   Pulse 69   Ht 5\' 3"  (1.6 m)   Wt 206 lb 9.6 oz (93.7 kg)   SpO2 99%   BMI 36.60 kg/m  , BMI Body mass index is 36.6 kg/m.     GEN: Well nourished, well developed, in no acute distress. HEENT: normal. Glasses on. Neck: Supple, no JVD,  carotid bruits, or masses. Cardiac: RRR, no murmurs, rubs, or gallops. No clubbing, cyanosis, edema.  Radials 2+/PT 2+ and equal bilaterally. Left peck tenderness that is reproducible Respiratory:  Respirations regular and unlabored, clear to auscultation bilaterally. GI: Soft, nontender, nondistended, BS + x 4. MS: no deformity or atrophy. Skin: warm and dry, no rash. Neuro:  Strength and sensation are intact. Psych: Normal affect.  Accessory Clinical Findings    ECG personally reviewed by me today- No new tracings completed today  Lab Results  Component Value Date  WBC 6.1 01/01/2022   HGB 13.0 01/01/2022   HCT 38.2 (L) 01/01/2022   MCV 82.5 01/01/2022   PLT 177 01/01/2022   Lab Results  Component Value Date   CREATININE 0.86 01/01/2022   BUN 15 01/01/2022   NA 136 01/01/2022   K 3.7 01/01/2022   CL 108 01/01/2022   CO2 20 (L) 01/01/2022   Lab Results  Component Value Date   ALT 69 (H) 01/01/2022   AST 52 (H) 01/01/2022   ALKPHOS 141 (H) 01/01/2022   BILITOT 1.3 (H) 01/01/2022   Lab Results  Component Value Date   CHOL 99 12/29/2021   HDL 34 (L) 12/29/2021   LDLCALC 46 12/29/2021   TRIG 95 12/29/2021   CHOLHDL 2.9 12/29/2021    Lab Results  Component Value Date   HGBA1C 5.8 (H) 07/06/2021    Assessment & Plan   1.  Coronary artery disease involving native coronary arteries status post CABG x 3 with stable angina.  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  He is continued on aspirin 81 mg daily, clopidogrel 75 mg daily, Imdur 30 mg daily, and Crestor 40 mg daily, as well as Repatha 140 mg injection every 2 weeks.  2.  Essential hypertension blood pressure today 136/88.  Blood pressure has been stable.  He is  continued on metoprolol tartrate 50 mg twice daily.  He has been encouraged to continue to monitor his blood pressure at home and at work if needed.  3.  Hyperlipidemia with an LDL of 46.  Currently at goal of less than 70.  He is on Repatha and Crestor.  4.  Continued left nipple tenderness that radiates up the chest up under the clavicle.  It is reproducible with mild palpation.  Symptoms could be musculoskeletal and no masses or abnormalities noted on physical exam.  He has being scheduled for an ultrasound.   5.  Disposition patient return to see MD/APP in 2 months or sooner if needed.  If ultrasound reveals abnormal findings appointment may be moved up to sooner date if needed.  Ara Mano, NP 05/02/2022, 8:17 AM

## 2022-05-01 ENCOUNTER — Telehealth: Payer: Self-pay | Admitting: Cardiovascular Disease

## 2022-05-01 MED ORDER — ROSUVASTATIN CALCIUM 40 MG PO TABS: 40.0000 mg | ORAL_TABLET | Freq: Every day | ORAL | 0 refills | Status: DC

## 2022-05-01 NOTE — Telephone Encounter (Signed)
*  STAT* If patient is at the pharmacy, call can be transferred to refill team.   1. Which medications need to be refilled? (please list name of each medication and dose if known)   rosuvastatin (CRESTOR) 40 MG tablet     2. Which pharmacy/location (including street and city if local pharmacy) is medication to be sent to?   [facility] - [place] ([address]), [place] - [address]    3. Do they need a 30 day or 90 day supply? 90

## 2022-05-02 ENCOUNTER — Ambulatory Visit: Payer: Self-pay | Attending: Cardiology | Admitting: Cardiology

## 2022-05-02 ENCOUNTER — Encounter: Payer: Self-pay | Admitting: Cardiology

## 2022-05-02 VITALS — BP 136/86 | HR 69 | Ht 63.0 in | Wt 206.6 lb

## 2022-05-02 DIAGNOSIS — I10 Essential (primary) hypertension: Secondary | ICD-10-CM

## 2022-05-02 DIAGNOSIS — I25118 Atherosclerotic heart disease of native coronary artery with other forms of angina pectoris: Secondary | ICD-10-CM

## 2022-05-02 DIAGNOSIS — E782 Mixed hyperlipidemia: Secondary | ICD-10-CM

## 2022-05-02 DIAGNOSIS — N644 Mastodynia: Secondary | ICD-10-CM

## 2022-05-02 NOTE — Patient Instructions (Signed)
Medication Instructions:  No changes *If you need a refill on your cardiac medications before your next appointment, please call your pharmacy*   Lab Work: None ordered If you have labs (blood work) drawn today and your tests are completely normal, you will receive your results only by: MyChart Message (if you have MyChart) OR A paper copy in the mail If you have any lab test that is abnormal or we need to change your treatment, we will call you to review the results.   Testing/Procedures: Your physician has requested that you have a upper extremity arterial duplex. During this test, ultrasound is used to evaluate arterial blood flow in the upper extremities. Allow one hour for this exam. There are no restrictions or special instructions. This will take place at [address], [place] [address]   Follow-Up: At [facility], you and your health needs are our priority.  As part of our continuing mission to provide you with exceptional heart care, we have created designated Provider Care Teams.  These Care Teams include your primary Cardiologist (physician) and Advanced Practice Providers (APPs -  Physician Assistants and Nurse Practitioners) who all work together to provide you with the care you need, when you need it.  We recommend signing up for the patient portal called "MyChart".  Sign up information is provided on this After Visit Summary.  MyChart is used to connect with patients for Virtual Visits (Telemedicine).  Patients are able to view lab/test results, encounter notes, upcoming appointments, etc.  Non-urgent messages can be sent to your provider as well.   To learn more about what you can do with MyChart, go to [email or web address].    Your next appointment:   3 month(s)  The format for your next appointment:   In Person  Provider:   You may see Lorine Bears, MD or one of the following Advanced Practice Providers on your  designated Care Team:   Nicolasa Ducking, NP Eula Listen, PA-C Cadence Fransico Michael, PA-C Charlsie Quest, NP

## 2022-05-16 ENCOUNTER — Telehealth: Payer: Self-pay | Admitting: Cardiovascular Disease

## 2022-05-16 MED ORDER — CLOPIDOGREL BISULFATE 75 MG PO TABS: 75.0000 mg | ORAL_TABLET | Freq: Every day | ORAL | 3 refills | Status: DC

## 2022-05-16 MED ORDER — PANTOPRAZOLE SODIUM 40 MG PO TBEC: 40.0000 mg | DELAYED_RELEASE_TABLET | Freq: Every day | ORAL | 3 refills | Status: DC

## 2022-05-16 MED ORDER — METOPROLOL TARTRATE 50 MG PO TABS: 50.0000 mg | ORAL_TABLET | Freq: Two times a day (BID) | ORAL | 3 refills | Status: DC

## 2022-05-16 NOTE — Telephone Encounter (Signed)
*  STAT* If patient is at the pharmacy, call can be transferred to refill team.   1. Which medications need to be refilled? (please list name of each medication and dose if known)    clopidogrel (PLAVIX) 75 MG tablet  pantoprazole (PROTONIX) 40 MG tablet  metoprolol tartrate (LOPRESSOR) 50 MG tablet   2. Which pharmacy/location (including street and city if local pharmacy) is medication to be sent to?  [facility] - [place] ([address]), [place] - [address]    3. Do they need a 30 day or 90 day supply? 30, pt specifically requested only 30 day supply

## 2022-06-16 ENCOUNTER — Other Ambulatory Visit: Payer: Self-pay

## 2022-06-16 ENCOUNTER — Telehealth: Payer: Self-pay | Admitting: Cardiovascular Disease

## 2022-06-16 MED ORDER — CLOPIDOGREL BISULFATE 75 MG PO TABS: 75.0000 mg | ORAL_TABLET | Freq: Every day | ORAL | 0 refills | Status: DC

## 2022-06-16 MED ORDER — METOPROLOL TARTRATE 50 MG PO TABS: 50.0000 mg | ORAL_TABLET | Freq: Two times a day (BID) | ORAL | 0 refills | Status: DC

## 2022-06-16 MED ORDER — PANTOPRAZOLE SODIUM 40 MG PO TBEC: 40.0000 mg | DELAYED_RELEASE_TABLET | Freq: Every day | ORAL | 3 refills | Status: DC

## 2022-06-16 NOTE — Telephone Encounter (Signed)
*  STAT* If patient is at the pharmacy, call can be transferred to refill team.   1. Which medications need to be refilled? (please list name of each medication and dose if known) clopidogrel (PLAVIX) 75 MG tablet   metoprolol tartrate (LOPRESSOR) 50 MG tablet    2. Which pharmacy/location (including street and city if local pharmacy) is medication to be sent to?  [place] ([address]), [place] - [address]    3. Do they need a 30 day or 90 day supply? [place]

## 2022-06-16 NOTE — Telephone Encounter (Signed)
Requested Prescriptions   Signed Prescriptions Disp Refills   metoprolol tartrate (LOPRESSOR) 50 MG tablet 180 tablet 0    Sig: Take 1 tablet (50 mg total) by mouth 2 (two) times daily.    Authorizing Provider: Kathlyn Sacramento A    Ordering User: Othelia Pulling C   clopidogrel (PLAVIX) 75 MG tablet 90 tablet 0    Sig: Take 1 tablet (75 mg total) by mouth daily.    Authorizing Provider: Kathlyn Sacramento A    Ordering User: Britt Bottom

## 2022-06-16 NOTE — Telephone Encounter (Signed)
Disp Refills Start End   pantoprazole (PROTONIX) 40 MG tablet 30 tablet 3 06/16/2022    Sig - Route: Take 1 tablet (40 mg total) by mouth once daily. - Oral   Sent to pharmacy as: pantoprazole (PROTONIX) 40 MG tablet   E-Prescribing Status: Sent to pharmacy (06/16/2022  1:34 PM EST)    Pharmacy  [facility] [place] ([address]), [place] - [address]

## 2022-06-16 NOTE — Telephone Encounter (Signed)
*  STAT* If patient is at the pharmacy, call can be transferred to refill team.   1. Which medications need to be refilled? (please list name of each medication and dose if known)  Pantoprazole  2. Which pharmacy/location (including street and city if local pharmacy) is medication to be sent to? [place], [place]  3. Do they need a 30 day or 90 day supply? 30 days

## 2022-06-17 ENCOUNTER — Emergency Department
Admission: EM | Admit: 2022-06-17 | Discharge: 2022-06-17 | Disposition: A | Discharge status: Home / Self Care | Payer: Self-pay | Attending: Emergency Medicine | Admitting: Emergency Medicine

## 2022-06-17 ENCOUNTER — Other Ambulatory Visit: Payer: Self-pay

## 2022-06-17 ENCOUNTER — Telehealth: Payer: Self-pay | Admitting: Cardiovascular Disease

## 2022-06-17 ENCOUNTER — Emergency Department: Payer: Self-pay

## 2022-06-17 DIAGNOSIS — I2510 Atherosclerotic heart disease of native coronary artery without angina pectoris: Secondary | ICD-10-CM | POA: Insufficient documentation

## 2022-06-17 DIAGNOSIS — I10 Essential (primary) hypertension: Secondary | ICD-10-CM | POA: Insufficient documentation

## 2022-06-17 DIAGNOSIS — U071 COVID-19: Secondary | ICD-10-CM | POA: Insufficient documentation

## 2022-06-17 LAB — COMPREHENSIVE METABOLIC PANEL
ALT: 18 U/L (ref 0–44)
AST: 25 U/L (ref 15–41)
Albumin: 3.3 g/dL — ABNORMAL LOW (ref 3.5–5.0)
Alkaline Phosphatase: 66 U/L (ref 38–126)
Anion gap: 4 — ABNORMAL LOW (ref 5–15)
BUN: 11 mg/dL (ref 8–23)
CO2: 25 mmol/L (ref 22–32)
Calcium: 7.7 mg/dL — ABNORMAL LOW (ref 8.9–10.3)
Chloride: 107 mmol/L (ref 98–111)
Creatinine, Ser: 0.87 mg/dL (ref 0.61–1.24)
GFR, Estimated: >60 mL/min (ref >60)
Glucose, Bld: 117 mg/dL — ABNORMAL HIGH (ref 70–99)
Potassium: 3.4 mmol/L — ABNORMAL LOW (ref 3.5–5.1)
Sodium: 136 mmol/L (ref 135–145)
Total Bilirubin: 0.9 mg/dL (ref 0.3–1.2)
Total Protein: 7.1 g/dL (ref 6.5–8.1)

## 2022-06-17 LAB — CBC
HCT: 41.3 % (ref 39.0–52.0)
Hemoglobin: 13.4 g/dL (ref 13.0–17.0)
MCH: 27.4 pg (ref 26.0–34.0)
MCHC: 32.4 g/dL (ref 30.0–36.0)
MCV: 84.5 fL (ref 80.0–100.0)
Platelets: 193 10*3/uL (ref 150–400)
RBC: 4.89 MIL/uL (ref 4.22–5.81)
RDW: 13.2 % (ref 11.5–15.5)
WBC: 6 10*3/uL (ref 4.0–10.5)
nRBC: 0 % (ref 0.0–0.2)

## 2022-06-17 LAB — TROPONIN I (HIGH SENSITIVITY): Troponin I (High Sensitivity): 4 ng/L (ref <18)

## 2022-06-17 LAB — RESP PANEL BY RT-PCR (RSV, FLU A&B, COVID)  RVPGX2
Influenza A by PCR: NEGATIVE
Influenza B by PCR: NEGATIVE
Resp Syncytial Virus by PCR: NEGATIVE
SARS Coronavirus 2 by RT PCR: POSITIVE — AB

## 2022-06-17 MED ORDER — NIRMATRELVIR/RITONAVIR (PAXLOVID)TABLET: 3.0000 | ORAL_TABLET | Freq: Two times a day (BID) | ORAL | 0 refills | Status: AC

## 2022-06-17 MED ORDER — ALBUTEROL SULFATE HFA 108 (90 BASE) MCG/ACT IN AERS: 2.0000 | INHALATION_SPRAY | Freq: Four times a day (QID) | RESPIRATORY_TRACT | 2 refills | Status: DC | PRN

## 2022-06-17 MED ORDER — PREDNISONE 10 MG PO TABS: ORAL_TABLET | ORAL | 0 refills | Status: AC

## 2022-06-17 NOTE — Telephone Encounter (Signed)
Pt called to report for the past 3 days he's been fighting a cold and has noticed an increase in his blood pressure (readings listed below). Pt also report when he is up walking around, he feels woozy and experiences chest tightness. He reports  belching or laying down will help ease pain.  Pt report he is currently experiencing chest tightness Due to current symptoms, nurse recommended pt report to ER for further evaluations. Pt verbalized understanding.    1/30-192/108 1/30-187/116 1/29-171/95 1/29-165/85

## 2022-06-17 NOTE — Telephone Encounter (Signed)
Pt made aware and verbalized understanding.

## 2022-06-17 NOTE — ED Triage Notes (Addendum)
Pt in with sore throat, headache, cough states productive. States symptoms started 3 days, no fever. Pt also co chest tightness and htn.

## 2022-06-17 NOTE — Telephone Encounter (Signed)
Patient is following up (see 1/30 telephone encounter) requesting to speak with RN. He states he found out that he actually has COVID. He mentions that he has been put on Prednisone and Paxlovid and he would like to discuss further.

## 2022-06-17 NOTE — Telephone Encounter (Signed)
Pt called to provide and update stating he was dx with COVID today and prescribed prednisone and paxlovid. Pt wanted to make sure ok to take with current cardiac medication.

## 2022-06-17 NOTE — ED Provider Notes (Signed)
[facility] Provider Note    Event Date/Time   First MD Initiated Contact with Patient 06/17/22 1121     (approximate)   History   Chief Complaint Headache   HPI Jeffery Sweeney is a 62 y.o. male, history of three-vessel CAD, hyperlipidemia, obesity, hypertension, presents to the emergency department for evaluation of flulike symptoms.  He reports sore throat, headache, and productive cough x 3 days.  He states that he was recent exposed to somebody with similar symptoms, however they have not been diagnosed with anything in particular.  He has been taken over-the-counter Sudafed, which he believes may be contributing to his elevated blood pressure.  He does endorse some chest tightness.  Denies fever/chills, myalgias, chest pain, shortness of breath, leg pain, dizziness/lightheadedness, abdominal pain, nausea/vomiting, or urinary symptoms.  History Limitations: N/A.        Physical Exam  Triage Vital Signs: ED Triage Vitals  Enc Vitals Group     BP 06/17/22 1049 (!) 170/102     Pulse Rate 06/17/22 1049 63     Resp 06/17/22 1049 18     Temp 06/17/22 1049 97.7 F (36.5 C)     Temp Source 06/17/22 1049 Oral     SpO2 06/17/22 1049 96 %     Weight 06/17/22 1050 206 lb (93.4 kg)     Height 06/17/22 1050 5\' 3"  (1.6 m)     Head Circumference --      Peak Flow --      Pain Score 06/17/22 1050 3     Pain Loc --      Pain Edu? --      Excl. in [place]? --     Most recent vital signs: Vitals:   06/17/22 1049  BP: (!) 170/102  Pulse: 63  Resp: 18  Temp: 97.7 F (36.5 C)  SpO2: 96%    General: Awake, NAD.  Skin: Warm, dry. No rashes or lesions.  Eyes: PERRL. Conjunctivae normal.  CV: Good peripheral perfusion.  Resp: Normal effort.  Diffuse, mild wheezing in the apices and bases.  Mild rhonchi.  Throat is mildly erythematous.  No exudates.  Uvula midline. Abd: Soft, non-tender. No distention.  Neuro: At baseline. No gross neurological deficits.   Musculoskeletal: Normal ROM of all extremities.  Physical Exam    ED Results / Procedures / Treatments  Labs (all labs ordered are listed, but only abnormal results are displayed) Labs Reviewed  RESP PANEL BY RT-PCR (RSV, FLU A&B, COVID)  [id number] - Abnormal; Notable for the following components:      Result Value   SARS Coronavirus 2 by RT PCR POSITIVE (*)    All other components within normal limits  COMPREHENSIVE METABOLIC PANEL - Abnormal; Notable for the following components:   Potassium 3.4 (*)    Glucose, Bld 117 (*)    Calcium 7.7 (*)    Albumin 3.3 (*)    Anion gap 4 (*)    All other components within normal limits  CBC  TROPONIN I (HIGH SENSITIVITY)     EKG Sinus rhythm, rate of 60, no ST segment changes, normal QRS, no QT prolongation, no significant axis deviations.    RADIOLOGY  ED Provider Interpretation: I personally viewed and interpreted this x-ray, no acute cardiopulmonary otherwise.  DG Chest 2 View  Result Date: 06/17/2022 CLINICAL DATA:  Cough. EXAM: CHEST - 2 VIEW COMPARISON:  Chest x-ray 12/28/2021. FINDINGS: Cardiomediastinal silhouette is within normal limits. CABG and median sternotomy.  No consolidation. No visible pleural effusions or pneumothorax. Polyarticular degenerative change. IMPRESSION: No active cardiopulmonary disease. Electronically Signed   By: Margaretha Sheffield M.D.   On: 06/17/2022 11:14    PROCEDURES:  Critical Care performed: N/A.  Procedures    MEDICATIONS ORDERED IN ED: Medications - No data to display   IMPRESSION / MDM / [place] / ED COURSE  I reviewed the triage vital signs and the nursing notes.                              Differential diagnosis includes, but is not limited to, influenza, COVID-19, RSV, bronchitis, community-acquired pneumonia, sinusitis  ED Course Patient appears well, NAD.  Hypertensive today at 170/102, consistent with underlying history of hypertension likely exacerbated by  pseudoephedrine usage.  CBC shows no leukocytosis or anemia.  No thrombocytopenia.  CMP shows hypocalcemia at 7.7.  Otherwise no significant electrolyte abnormalities.  No transaminitis or AKI.  Initial troponin 4, consistent with baseline.  Respiratory panel positive for COVID-19.  Assessment/Plan Presentation consistent with COVID-19 infection.  He appears clinically stable with vitals within normal limits for him, however he does have some mild wheezing and rhonchi in the apices and bases.  Will provide him with a short course of prednisone, as well as a albuterol inhaler.  In addition, given his comorbidities, we will start him on Paxlovid as well.  Encouraged him to continue the Tylenol/ibuprofen, though did advise him to cut back on the Sudafed given his hypertension.  In addition, advised him of his low calcium levels and advised him to consume calcium rich foods/beverages for the next couple days.  His workup is otherwise unremarkable.  Chest x-ray is clear.  EKG is unremarkable.  Low suspicion for any occult pathology warranting further treatment or workup.  Encouraged him to follow-up with his primary care provider as needed.  Will discharge.  Considered admission for this patient, but given his stable presentation and access to follow-up, he is unlikely benefit from admission.  Provided the patient with anticipatory guidance, return precautions, and educational material. Encouraged the patient to return to the emergency department at any time if they begin to experience any new or worsening symptoms. Patient expressed understanding and agreed with the plan.   Patient's presentation is most consistent with acute complicated illness / injury requiring diagnostic workup.       FINAL CLINICAL IMPRESSION(S) / ED DIAGNOSES   Final diagnoses:  COVID-19     Rx / DC Orders   ED Discharge Orders          Ordered    nirmatrelvir/ritonavir (PAXLOVID) 20 x 150 MG & 10 x 100MG  TABS  2  times daily        06/17/22 1215    predniSONE (DELTASONE) 10 MG tablet  Q breakfast        06/17/22 1215    albuterol (VENTOLIN HFA) 108 (90 Base) MCG/ACT inhaler  Every 6 hours PRN        06/17/22 1215             Note:  This document was prepared using Dragon voice recognition software and may include unintentional dictation errors.   Teodoro Spray, [place] 06/17/22 1221    Lavonia Drafts, MD 06/17/22 1230

## 2022-06-17 NOTE — Telephone Encounter (Signed)
Paxlovid may decrease the effect of his Plavix. Given the fact that he had a STEMI in May, I would not recommend taking.   Prednisone could increase his blood pressure. I would only take if shortness of breath or wheezing doesn't improve with inhaler.  He should not take sudafed or IBU due to his elevated blood pressure.

## 2022-06-17 NOTE — Telephone Encounter (Signed)
Pt c/o BP issue: STAT if pt c/o blurred vision, one-sided weakness or slurred speech  1. What are your last 5 BP readings?at this time it is 187/116, 192/108- this morning earlier, 171/95 yesterday  165/85  2. Are you having any other symptoms (ex. Dizziness, headache, blurred vision, passed out)? Dizzy sometimes- congestion in his chest from cold  3. What is your BP issue? Blood pressure running high

## 2022-06-17 NOTE — Discharge Instructions (Addendum)
-  You tested positive for COVID-19.  Please take the full course of the Paxlovid as prescribed.  In addition, please take the prednisone as well.  For chest tightness/shortness of breath, you may take the albuterol inhaler as needed.  -You may continue other over-the-counter medications, such as Tylenol and ibuprofen.  However, please cut back on the Sudafed in order to prevent further worsening of your blood pressure.  -Your calcium levels did appear low today.  Recommend calcium rich foods/dairy beverages over the next few days.  -Follow-up with your primary care provider as needed.  -Return to the emergency department anytime if you begin to experience any new or worsening symptoms.

## 2022-06-20 ENCOUNTER — Telehealth: Payer: Self-pay | Admitting: Cardiovascular Disease

## 2022-06-20 NOTE — Telephone Encounter (Signed)
Pt c/o BP issue: STAT if pt c/o blurred vision, one-sided weakness or slurred speech  1. What are your last 5 BP readings?  2/01: 129/80's, 141/80 2/02: 147/85  2. Are you having any other symptoms (ex. Dizziness, headache, blurred vision, passed out)?  No   3. What is your BP issue?   Patient states his BP has been fluctuating.

## 2022-06-23 NOTE — Telephone Encounter (Signed)
Patient returning call.

## 2022-06-23 NOTE — Telephone Encounter (Signed)
Pt called to report elevated BP. Pt denies headache or any other symptoms. However, pt stated he is currently recovering from [place] and has 4 more days left of prednisone.   1/29-187/116 1/31-145/83,156/85,150/88,154/87 2/1-129/65, 141/80, 144/78,133/67,153/84 2/2-158/88, 159/88 2/3-145/74, 146/75, 145/74, 164/86,149/81, 2/4-144/75,146/77,144/83 2/5-183/105  Nurse informed pt prednisone and taking BP multiple times a day can play a factor in elevated BP.   Nurse will forward message to MD for further evaluations.

## 2022-06-23 NOTE — Telephone Encounter (Signed)
Pt returning nurse's call. Please advise

## 2022-06-23 NOTE — Telephone Encounter (Signed)
He should update [place] about his blood pressure once he recovers from [place] and finishes treatment with prednisone.

## 2022-06-23 NOTE — Telephone Encounter (Signed)
Left a message for the patient to call back.  

## 2022-06-23 NOTE — Telephone Encounter (Signed)
Pt made aware of MD's recommendations and verbalized understanding.  

## 2022-07-01 ENCOUNTER — Ambulatory Visit: Payer: Self-pay

## 2022-07-07 ENCOUNTER — Encounter: Payer: Self-pay | Admitting: Cardiology

## 2022-07-07 ENCOUNTER — Telehealth: Payer: Self-pay | Admitting: Cardiovascular Disease

## 2022-07-07 MED ORDER — ISOSORBIDE MONONITRATE ER 30 MG PO TB24: 30.0000 mg | ORAL_TABLET | Freq: Every day | ORAL | 4 refills | Status: DC

## 2022-07-07 NOTE — Telephone Encounter (Signed)
Error

## 2022-07-07 NOTE — Telephone Encounter (Signed)
The patient has been made aware that the refill has been sent in for him as a 30 day supply, per his request.

## 2022-07-07 NOTE — Telephone Encounter (Signed)
Patient calling the office for samples of medication:   1.  What medication and dosage are you requesting samples for?   isosorbide mononitrate (IMDUR) 30 MG 24 hr tablet    2.  Are you currently out of this medication? Yes, pt called saying he just took his last pill this morning. He stated that he doesn't have the funds for his refill until Thursday.

## 2022-07-21 ENCOUNTER — Emergency Department
Admission: EM | Admit: 2022-07-21 | Discharge: 2022-07-21 | Disposition: A | Discharge status: Home / Self Care | Payer: Self-pay | Attending: Emergency Medicine | Admitting: Emergency Medicine

## 2022-07-21 ENCOUNTER — Encounter: Payer: Self-pay | Admitting: Emergency Medicine

## 2022-07-21 ENCOUNTER — Telehealth: Payer: Self-pay | Admitting: Cardiovascular Disease

## 2022-07-21 ENCOUNTER — Other Ambulatory Visit: Payer: Self-pay

## 2022-07-21 ENCOUNTER — Emergency Department: Payer: Self-pay

## 2022-07-21 DIAGNOSIS — Z951 Presence of aortocoronary bypass graft: Secondary | ICD-10-CM | POA: Insufficient documentation

## 2022-07-21 DIAGNOSIS — I2510 Atherosclerotic heart disease of native coronary artery without angina pectoris: Secondary | ICD-10-CM | POA: Insufficient documentation

## 2022-07-21 DIAGNOSIS — Z8673 Personal history of transient ischemic attack (TIA), and cerebral infarction without residual deficits: Secondary | ICD-10-CM | POA: Insufficient documentation

## 2022-07-21 DIAGNOSIS — R079 Chest pain, unspecified: Secondary | ICD-10-CM | POA: Insufficient documentation

## 2022-07-21 LAB — CBC
HCT: 41.2 % (ref 39.0–52.0)
Hemoglobin: 13.3 g/dL (ref 13.0–17.0)
MCH: 27.7 pg (ref 26.0–34.0)
MCHC: 32.3 g/dL (ref 30.0–36.0)
MCV: 85.8 fL (ref 80.0–100.0)
Platelets: 220 10*3/uL (ref 150–400)
RBC: 4.8 MIL/uL (ref 4.22–5.81)
RDW: 13.7 % (ref 11.5–15.5)
WBC: 5.7 10*3/uL (ref 4.0–10.5)
nRBC: 0 % (ref 0.0–0.2)

## 2022-07-21 LAB — BASIC METABOLIC PANEL
Anion gap: 7 (ref 5–15)
BUN: 12 mg/dL (ref 8–23)
CO2: 25 mmol/L (ref 22–32)
Calcium: 8.4 mg/dL — ABNORMAL LOW (ref 8.9–10.3)
Chloride: 107 mmol/L (ref 98–111)
Creatinine, Ser: 0.84 mg/dL (ref 0.61–1.24)
GFR, Estimated: >60 mL/min (ref >60)
Glucose, Bld: 210 mg/dL — ABNORMAL HIGH (ref 70–99)
Potassium: 3.5 mmol/L (ref 3.5–5.1)
Sodium: 139 mmol/L (ref 135–145)

## 2022-07-21 LAB — TROPONIN I (HIGH SENSITIVITY)
Troponin I (High Sensitivity): 3 ng/L (ref <18)
Troponin I (High Sensitivity): 4 ng/L (ref <18)

## 2022-07-21 MED ORDER — LIDOCAINE VISCOUS HCL 2 % MT SOLN
15.0000 mL | Freq: Once | OROMUCOSAL | Status: AC
  Administered 2022-07-21: 15 mL via ORAL
  Filled 2022-07-21: qty 15

## 2022-07-21 MED ORDER — ALUM & MAG HYDROXIDE-SIMETH 200-200-20 MG/5ML PO SUSP
30.0000 mL | Freq: Once | ORAL | Status: AC
  Administered 2022-07-21: 30 mL via ORAL
  Filled 2022-07-21: qty 30

## 2022-07-21 NOTE — ED Triage Notes (Signed)
Pt in vis POV c/o chest tightness in middle of chest. Pt states it started yesterday with SOB followed by the tightness during rest last night. Pt says pain has not gotten any better since yesterday. 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

## 2022-07-21 NOTE — ED Provider Notes (Signed)
[facility] Provider Note    Event Date/Time   First MD Initiated Contact with Patient 07/21/22 1830     (approximate)  History   Chief Complaint: Chest Pain  HPI  Jeffery Sweeney is a 62 y.o. male with a past medical history of CVA, CAD with prior MI, status post CABG who presents to the emergency department for chest pain.  According to the patient since yesterday he has been experiencing intermittent chest pain.  States it almost feels like "indigestion."  States he tried some indigestion medication without relief so he came to the emergency department.  Patient denies any pain currently but states just a slight sensation of tightness to the chest.  Patient denies any nausea or diaphoresis but does states some mild shortness of breath at times.  No fever cough or congestion.  Physical Exam   Triage Vital Signs: ED Triage Vitals  Enc Vitals Group     BP 07/21/22 1709 (!) 165/92     Pulse Rate 07/21/22 1709 73     Resp 07/21/22 1709 18     Temp 07/21/22 1709 97.8 F (36.6 C)     Temp Source 07/21/22 1709 Oral     SpO2 07/21/22 1709 98 %     Weight 07/21/22 1705 205 lb (93 kg)     Height 07/21/22 1705 '5\' 3"'$  (1.6 m)     Head Circumference --      Peak Flow --      Pain Score 07/21/22 1705 9     Pain Loc --      Pain Edu? --      Excl. in [place]? --     Most recent vital signs: Vitals:   07/21/22 1709 07/21/22 1820  BP: (!) 165/92 (!) 145/88  Pulse: 73 72  Resp: 18 15  Temp: 97.8 F (36.6 C)   SpO2: 98% 98%    General: Awake, no distress.  CV:  Good peripheral perfusion.  Regular rate and rhythm  Resp:  Normal effort.  Equal breath sounds bilaterally.  Abd:  No distention.  Soft, nontender.  No rebound or guarding.  ED Results / Procedures / Treatments   EKG  EKG viewed and interpreted by myself shows a normal sinus rhythm at 79 bpm with a narrow QRS, normal axis, normal intervals, no concerning ST changes.  RADIOLOGY  Chest x-ray  viewed and interpreted by myself shows no consolidation on my evaluation. Radiology is read the chest x-ray is COPD with no active cardiopulmonary disease.   MEDICATIONS ORDERED IN ED: Medications  alum & mag hydroxide-simeth (MAALOX/MYLANTA) 200-200-20 MG/5ML suspension 30 mL (30 mLs Oral Given 07/21/22 1841)    And  lidocaine (XYLOCAINE) 2 % viscous mouth solution 15 mL (15 mLs Oral Given 07/21/22 1841)     IMPRESSION / MDM / ASSESSMENT AND PLAN / ED COURSE  I reviewed the triage vital signs and the nursing notes.  Patient's presentation is most consistent with acute presentation with potential threat to life or bodily function.  Patient with significant cardiac history presents to the emergency department for chest pain since yesterday.  Overall the patient appears well, no distress.  States very slight chest tightness currently but denies any chest pain.  States some shortness of breath yesterday but denies any today.  Reassuring physical exam.  Reassuring vital signs.  Patient's workup today shows a overall reassuring EKG, clear chest x-ray.  Patient CBC is normal, chemistry reassuring occluding a normal renal function  and initial troponin negative.  Will repeat a troponin we will treat with a GI cocktail to see if this improves the patient's symptoms at all although the patient denies any pain currently.  If the repeat troponin remains negative and the patient remains largely symptom-free anticipate the patient could likely be discharged home with cardiology follow-up.  Patient follows up with Dr. Fletcher Anon.    Patient's repeat troponin remains negative.  Patient continues to feel well.  States he feels much better after the GI cocktail suspect more of a GI issue.  Patient will follow-up with cardiology regardless tomorrow.  Discussed my typical chest pain return precautions.  FINAL CLINICAL IMPRESSION(S) / ED DIAGNOSES   Chest pain   Note:  This document was prepared using Dragon voice  recognition software and may include unintentional dictation errors.   Harvest Dark, MD 07/21/22 2227

## 2022-07-21 NOTE — ED Notes (Signed)
Pt declined wheelchair when offered upon discharge. Pt ambulated without assistance.  Pt has steady gait

## 2022-07-21 NOTE — Telephone Encounter (Signed)
Pt c/o of Chest Pain: STAT if CP now or developed within 24 hours  1. Are you having CP right now? Yes  2. Are you experiencing any other symptoms (ex. SOB, nausea, vomiting, sweating)? Weakness, sweating yesterday at work, tightness in pt's chest  3. How long have you been experiencing CP? Yesterday  4. Is your CP continuous or coming and going? Coming and going  5. Have you taken Nitroglycerin? No ?

## 2022-07-21 NOTE — Telephone Encounter (Signed)
Spoke with patient and he stated that he was having chest pain at work last night (07/20/22) while working but it subsided when he sat and rested. Patient stated that he feels like something is in his neck. Patient denies sob, nausea, vomiting, palpitations and fluttering. Patient restated that the pain goes into his neck and back to his chest. Recommended patient presents to the ED to be evaluated. Patient understood with read back

## 2022-07-31 ENCOUNTER — Telehealth: Payer: Self-pay | Admitting: Cardiovascular Disease

## 2022-07-31 MED ORDER — ROSUVASTATIN CALCIUM 40 MG PO TABS: 40.0000 mg | ORAL_TABLET | Freq: Every day | ORAL | 0 refills | Status: DC

## 2022-07-31 NOTE — Telephone Encounter (Signed)
*  STAT* If patient is at the pharmacy, call can be transferred to refill team.   1. Which medications need to be refilled? (please list name of each medication and dose if known) rosuvastatin (CRESTOR) 40 MG tablet Take 1 tablet (40 mg total) by mouth daily.   2. Which pharmacy/location (including street and city if local pharmacy) is medication to be sent to?[facility] (N), [place] - [address]  3. Do they need a 30 day or 90 day supply? 90 day supply

## 2022-07-31 NOTE — Telephone Encounter (Signed)
Requested Prescriptions   Signed Prescriptions Disp Refills   rosuvastatin (CRESTOR) 40 MG tablet 90 tablet 0    Sig: Take 1 tablet (40 mg total) by mouth daily.    Authorizing Provider: Kathlyn [place] A    Ordering User: Britt Bottom

## 2022-08-08 ENCOUNTER — Ambulatory Visit: Payer: Self-pay | Admitting: Cardiovascular Disease

## 2022-08-08 NOTE — Progress Notes (Deleted)
Cardiology Office Note   Date:  08/08/2022   ID:  Jeffery Sweeney, DOB 02-26-1961, MRN [id number]  PCP:  Patient, No Pcp Per  Cardiologist:   Kathlyn Sacramento, MD   No chief complaint on file.     History of Present Illness: Jeffery Sweeney is a 62 y.o. male who presents for a follow-up visit regarding coronary artery disease.  He has known history of essential hypertension, tobacco use and hyperlipidemia. The patient had previous PCI in [place] in 2019.  He presented in February of 2022  with unstable angina.  Cardiac catheterization showed significant three-vessel coronary artery disease.  Echo showed normal LV systolic function.  The patient underwent CABG x3 with LIMA to LAD, SVG to right PDA and SVG to ramus.  He was seen in May for recurrent anginal symptoms.  He underwent cardiac catheterization which showed severe underlying three-vessel coronary artery disease with patent grafts including LIMA to LAD, SVG to diagonal and SVG to right PDA.  The left circumflex had significant obstructive disease and was not bypassed due to small vessel size.  His LIMA to LAD had an 80% still stenosis with hazy appearance.  When the LIMA was engaged, there was acute closure and the graft.  I performed emergent aspiration thrombectomy and drug-eluting stent placement.  He was placed on Aggrastat but had recurrent chest pain and EKG changes after about 6 hours.  Repeat emergent cardiac cath showed acute closure of LIMA to LAD beyond the previously stented segment due to what seemed to be spiral dissection throughout the whole graft to that the stenosis.  This was treated successfully with aspiration thrombectomy and 7 overlapped drug-eluting stents to cover the whole length of the LIMA into the native LAD just beyond the anastomosis.  He was subsequently switched from Brilinta to clopidogrel due to cost. He was hospitalized in August after he presented with chest pain, cough and shortness of breath.   He was noted to be febrile and tachycardic.  He was found to have pneumonia and Enterobacter bacteremia from unclear source.  He improved with antibiotics.  He continues to work part-time at [employer].  He usually has to carry boxes that are about 20 pounds.  He reports substernal chest tightness that started about 2 weeks ago.  The pain is usually exertional and resolves with rest.  In addition, he complains of tenderness in the left nipple with radiation of the pain to the left shoulder.  Past Medical History:  Diagnosis Date   CVA (cerebral vascular accident) ([place]) 06/2021   Small acute/subacute infarcts in the left occipital lobe and   Hyperlipidemia LDL goal <70    started on Repatha 09/26/2021, continue Crestor 40mg  daily   Hypertension    Multiple vessel coronary artery disease    Myocardial infarct (HCC)    x 3; PCI with single stent placement for each (in [place])   NSTEMI (non-ST elevated myocardial infarction) ([place])    [place] 2/18 with multivessel CAD and transfer to [facility] for CABG eval   STEMI (ST elevation myocardial infarction) ([place]) 09/23/2021   LHC 09/23/2021 x 2   Tobacco use    2 cigarettes daily   Unstable angina ([place])    LHC 09/23/2021 x 2; total 8 DES to LIMA    Past Surgical History:  Procedure Laterality Date   CORONARY ANGIOPLASTY WITH STENT PLACEMENT     in [place] in 2018-19   [place] N/A 07/09/2020   Procedure: CORONARY  ARTERY BYPASS GRAFTING (CABG) x THREE , USING LEFT INTERNAL MAMMARY ARTERY, AND RIGHT LEG GREATER SAPHENOUS VEIN HARVESTED ENDOSCOPICALLY;  Surgeon: Grace Isaac, MD;  Location: [place];  Service: Open Heart Surgery;  Laterality: N/A;   CORONARY STENT INTERVENTION N/A 09/23/2021   Procedure: CORONARY STENT INTERVENTION;  Surgeon: Wellington Hampshire, MD;  Location: [place] [facility];  Service: Cardiovascular;  Laterality: N/A;   CORONARY/GRAFT ACUTE MI REVASCULARIZATION N/A 09/23/2021   Procedure: Coronary/Graft Acute  MI Revascularization;  Surgeon: Wellington Hampshire, MD;  Location: [place] [facility];  Service: Cardiovascular;  Laterality: N/A;   INTRAVASCULAR PRESSURE WIRE/FFR STUDY N/A 07/06/2020   Procedure: INTRAVASCULAR PRESSURE WIRE/FFR STUDY;  Surgeon: Wellington Hampshire, MD;  Location: [place] [facility];  Service: Cardiovascular;  Laterality: N/A;  LAD   LEFT HEART CATH AND CORONARY ANGIOGRAPHY N/A 07/06/2020   Procedure: LEFT HEART CATH AND CORONARY ANGIOGRAPHY;  Surgeon: Wellington Hampshire, MD;  Location: [place] [facility];  Service: Cardiovascular;  Laterality: N/A;   LEFT HEART CATH AND CORONARY ANGIOGRAPHY N/A 09/23/2021   Procedure: LEFT HEART CATH AND CORONARY ANGIOGRAPHY;  Surgeon: Wellington Hampshire, MD;  Location: [place] [facility];  Service: Cardiovascular;  Laterality: N/A;   LEFT HEART CATH AND CORS/GRAFTS ANGIOGRAPHY N/A 09/23/2021   Procedure: LEFT HEART CATH AND CORS/GRAFTS ANGIOGRAPHY;  Surgeon: Wellington Hampshire, MD;  Location: [place] [facility];  Service: Cardiovascular;  Laterality: N/A;   TEE WITHOUT CARDIOVERSION N/A 07/09/2020   Procedure: TRANSESOPHAGEAL ECHOCARDIOGRAM (TEE);  Surgeon: Grace Isaac, MD;  Location: [place];  Service: Open Heart Surgery;  Laterality: N/A;   WRIST SURGERY Left    for cyst     Current Outpatient Medications  Medication Sig Dispense Refill   albuterol (VENTOLIN HFA) 108 (90 Base) MCG/ACT inhaler Inhale 2 puffs into the lungs every 6 (six) hours as needed for wheezing or shortness of breath. 8 g 2   aspirin 81 MG chewable tablet Chew 1 tablet (81 mg total) by mouth daily.     calcium carbonate (TUMS - DOSED IN MG ELEMENTAL CALCIUM) 500 MG chewable tablet Chew 1 tablet (200 mg of elemental calcium total) by mouth 2 (two) times daily as needed for heartburn.     clopidogrel (PLAVIX) 75 MG tablet Take 1 tablet (75 mg total) by mouth daily. 90 tablet 0   Evolocumab (REPATHA SURECLICK) [id number] MG/ML SOAJ Inject 140 mg into the skin every 14  (fourteen) days. 2 mL 0   isosorbide mononitrate (IMDUR) 30 MG 24 hr tablet Take 1 tablet (30 mg total) by mouth daily. 30 tablet 4   meclizine (ANTIVERT) 25 MG tablet Take 1 tablet (25 mg total) by mouth 3 (three) times daily as needed for dizziness. 30 tablet 0   metoprolol tartrate (LOPRESSOR) 50 MG tablet Take 1 tablet (50 mg total) by mouth 2 (two) times daily. 180 tablet 0   nitroGLYCERIN (NITROSTAT) 0.4 MG SL tablet Place 1 tablet (0.4 mg total) under the tongue every 5 (five) minutes as needed for chest pain. 25 tablet 3   pantoprazole (PROTONIX) 40 MG tablet Take 1 tablet (40 mg total) by mouth once daily. 30 tablet 3   rosuvastatin (CRESTOR) 40 MG tablet Take 1 tablet (40 mg total) by mouth daily. 90 tablet 0   No current facility-administered medications for this visit.    Allergies:   Patient has no known allergies.    Social History:  The patient  reports that he has  been smoking cigarettes. He has been smoking an average of .5 packs per day. He has never used smokeless tobacco. He reports that he does not currently use alcohol. He reports that he does not currently use drugs.   Family History:  The patient's family history is not on file.    ROS:  Please see the history of present illness.   Otherwise, review of systems are positive for .   All other systems are reviewed and negative.    PHYSICAL EXAM: VS:  There were no vitals taken for this visit. , BMI There is no height or weight on file to calculate BMI. GEN: Well nourished, well developed, in no acute distress  HEENT: normal  Neck: no JVD, carotid bruits, or masses Cardiac: RRR; no murmurs, rubs, or gallops,no edema  Respiratory:  clear to auscultation bilaterally, normal work of breathing GI: soft, nontender, nondistended, + BS MS: no deformity or atrophy  Skin: warm and dry, no rash Neuro:  Strength and sensation are intact Psych: euthymic mood, full affect    EKG:  EKG is ordered today. EKG showed normal  sinus rhythm with no significant ST or T wave changes.   Recent Labs: 12/29/2021: Magnesium 2.1 06/17/2022: ALT 18 07/21/2022: BUN 12; Creatinine, Ser 0.84; Hemoglobin 13.3; Platelets 220; Potassium 3.5; Sodium 139    Lipid Panel    Component Value Date/Time   CHOL 99 12/29/2021 0551   TRIG 95 12/29/2021 0551   HDL 34 (L) 12/29/2021 0551   CHOLHDL 2.9 12/29/2021 0551   VLDL 19 12/29/2021 0551   LDLCALC 46 12/29/2021 0551      Wt Readings from Last 3 Encounters:  07/21/22 205 lb (93 kg)  06/17/22 206 lb (93.4 kg)  05/02/22 206 lb 9.6 oz (93.7 kg)           No data to display            ASSESSMENT AND PLAN:  1.  Coronary artery disease involving native coronary arteries with other forms of angina: Some of his symptoms are clearly musculoskeletal.  However, the exertional chest tightness is concerning especially with his known extensive cardiac history and multiple overlapped stents in the LIMA to LAD.  Fortunately, his EKG does not show ischemic changes.  I requested a Lexiscan Myoview.  I added Imdur 30 mg once daily.    2.  Essential hypertension: His blood pressure is elevated.  I added Imdur.  3.  Hyperlipidemia: Continue high-dose rosuvastatin.  Most recent lipid profile showed an LDL of 46.  4.  Left nipple tenderness: No clear or mass by exam.  Symptoms could be musculoskeletal.  If no improvement after 1 month, recommend an ultrasound.   Disposition: Schedule Lexiscan Myoview and follow-up in 1 month.  Signed,  Kathlyn Sacramento, MD  08/08/2022 7:30 AM    [place]

## 2022-08-18 ENCOUNTER — Telehealth: Payer: Self-pay | Admitting: Cardiovascular Disease

## 2022-08-18 ENCOUNTER — Emergency Department
Admission: EM | Admit: 2022-08-18 | Discharge: 2022-08-18 | Disposition: A | Discharge status: Home / Self Care | Payer: Self-pay | Attending: Emergency Medicine | Admitting: Emergency Medicine

## 2022-08-18 ENCOUNTER — Emergency Department: Payer: Self-pay

## 2022-08-18 ENCOUNTER — Other Ambulatory Visit: Payer: Self-pay

## 2022-08-18 DIAGNOSIS — I10 Essential (primary) hypertension: Secondary | ICD-10-CM | POA: Insufficient documentation

## 2022-08-18 DIAGNOSIS — R0602 Shortness of breath: Secondary | ICD-10-CM | POA: Insufficient documentation

## 2022-08-18 DIAGNOSIS — Z951 Presence of aortocoronary bypass graft: Secondary | ICD-10-CM | POA: Insufficient documentation

## 2022-08-18 DIAGNOSIS — F17210 Nicotine dependence, cigarettes, uncomplicated: Secondary | ICD-10-CM | POA: Insufficient documentation

## 2022-08-18 DIAGNOSIS — I2510 Atherosclerotic heart disease of native coronary artery without angina pectoris: Secondary | ICD-10-CM | POA: Insufficient documentation

## 2022-08-18 DIAGNOSIS — R42 Dizziness and giddiness: Secondary | ICD-10-CM | POA: Insufficient documentation

## 2022-08-18 LAB — CBC
HCT: 42.6 % (ref 39.0–52.0)
Hemoglobin: 14 g/dL (ref 13.0–17.0)
MCH: 27.8 pg (ref 26.0–34.0)
MCHC: 32.9 g/dL (ref 30.0–36.0)
MCV: 84.7 fL (ref 80.0–100.0)
Platelets: 243 10*3/uL (ref 150–400)
RBC: 5.03 MIL/uL (ref 4.22–5.81)
RDW: 14.1 % (ref 11.5–15.5)
WBC: 7 10*3/uL (ref 4.0–10.5)
nRBC: 0 % (ref 0.0–0.2)

## 2022-08-18 LAB — BASIC METABOLIC PANEL
Anion gap: 8 (ref 5–15)
BUN: 11 mg/dL (ref 8–23)
CO2: 24 mmol/L (ref 22–32)
Calcium: 8.7 mg/dL — ABNORMAL LOW (ref 8.9–10.3)
Chloride: 106 mmol/L (ref 98–111)
Creatinine, Ser: 0.96 mg/dL (ref 0.61–1.24)
GFR, Estimated: >60 mL/min (ref >60)
Glucose, Bld: 187 mg/dL — ABNORMAL HIGH (ref 70–99)
Potassium: 3.7 mmol/L (ref 3.5–5.1)
Sodium: 138 mmol/L (ref 135–145)

## 2022-08-18 LAB — TROPONIN I (HIGH SENSITIVITY)
Troponin I (High Sensitivity): 4 ng/L (ref <18)
Troponin I (High Sensitivity): 3 ng/L (ref <18)

## 2022-08-18 NOTE — Telephone Encounter (Signed)
The patient called in with complaints of being light headed, short of breath and sweaty. He denies a fever and chest pain. He stated he just started feeling this way.   Blood pressure was 161/92 and heart rate was 77 while on the phone.  The patient stated that he was going to go to the ED and was calling to let Dr. Fletcher Anon know. He will have a friend take him.

## 2022-08-18 NOTE — Discharge Instructions (Signed)
Please make sure you are standing up slowly.  If you develop any new symptoms such as chest pain difficulty breathing that concern you please return to emergency department.  Otherwise please follow-up with Dr. Mariea Clonts and continue to take your medications as prescribed.

## 2022-08-18 NOTE — Telephone Encounter (Signed)
STAT if patient feels like he/she is going to faint   Are you dizzy now? Yes  Do you feel faint or have you passed out? No  Do you have any other symptoms? Pt feels lightheaded, sweaty, and "funny"  Have you checked your HR and BP (record if available)? Pt stated their BP was 156/ but does not know the bottom number.

## 2022-08-18 NOTE — ED Triage Notes (Signed)
Pt to ED via POV from home. Pt reports dizziness that started around 3pm. Pt also reports SOB. Pt with hx triple bypass with 10 stents. Pt is on blood thinners. Pt reports his BP at home was 123456 systolic.

## 2022-08-18 NOTE — ED Provider Notes (Signed)
[facility] Provider Note    Event Date/Time   First MD Initiated Contact with Patient 08/18/22 2016     (approximate)   History   Dizziness and Shortness of Breath   HPI  Jeffery Sweeney is a 62 y.o. male past medical history of coronary artery disease status post multiple stents, hypertension hyperlipidemia CVA who presents because of dizziness.  Patient was sitting down and when he stood up felt lightheaded like he was going to pass out.  Felt somewhat warm and sweaty but did feel improved when he sat down.  Did have some residual feeling of lightheadedness for about an hour.  He denies any diplopia numbness or weakness denies any chest pain or dyspnea.  Has had similar episodes in the past.  Denies new medication changes.  Denies any recent illnesses nausea vomiting diarrhea fevers cough etc     Past Medical History:  Diagnosis Date   CVA (cerebral vascular accident) 06/2021   Small acute/subacute infarcts in the left occipital lobe and   Hyperlipidemia LDL goal <70    started on Repatha 09/26/2021, continue Crestor 40mg  daily   Hypertension    Multiple vessel coronary artery disease    Myocardial infarct    x 3; PCI with single stent placement for each (in [place])   NSTEMI (non-ST elevated myocardial infarction)    [facility] 2/18 with multivessel CAD and transfer to [facility] for CABG eval   STEMI (ST elevation myocardial infarction) 09/23/2021   LHC 09/23/2021 x 2   Tobacco use    2 cigarettes daily   Unstable angina    LHC 09/23/2021 x 2; total 8 DES to LIMA    Patient Active Problem List   Diagnosis Date Noted   Obesity (BMI 30-39.9) 12/30/2021   Possible acute cholecystitis 12/30/2021   Multifocal pneumonia 12/29/2021   Severe sepsis 12/29/2021   Bacteremia due to Enterobacter species 12/29/2021   Acute respiratory failure with hypoxia 12/28/2021   Hyperlipidemia LDL goal <70 09/26/2021   Stroke (cerebrum) 07/05/2021   Vertigo    S/P CABG x 3  07/09/2020   Chest pain 07/06/2020   CAD, multiple vessel 07/06/2020   3-vessel CAD 07/06/2020   Hypertension    Tobacco abuse    Non-ST elevation (NSTEMI) myocardial infarction    Unstable angina 07/05/2020     Physical Exam  Triage Vital Signs: ED Triage Vitals [08/18/22 1630]  Enc Vitals Group     BP (!) 143/86     Pulse Rate 88     Resp 18     Temp 97.8 F (36.6 C)     Temp src      SpO2 97 %     Weight      Height      Head Circumference      Peak Flow      Pain Score 0     Pain Loc      Pain Edu?      Excl. in [place]?     Most recent vital signs: Vitals:   08/18/22 1630 08/18/22 2100  BP: (!) 143/86 129/77  Pulse: 88 72  Resp: 18 17  Temp: 97.8 F (36.6 C)   SpO2: 97% 98%     General: Awake, no distress.  CV:  Good peripheral perfusion.  Resp:  Normal effort.  Lung sounds are clear Abd:  No distention.  Abdomen is soft nontender Neuro:  Awake, Alert, Oriented x 3  Other:     ED Results / Procedures / Treatments  Labs (all labs ordered are listed, but only abnormal results are displayed) Labs Reviewed  BASIC METABOLIC PANEL - Abnormal; Notable for the following components:      Result Value   Glucose, Bld 187 (*)    Calcium 8.7 (*)    All other components within normal limits  CBC  CBG MONITORING, ED  TROPONIN I (HIGH SENSITIVITY)  TROPONIN I (HIGH SENSITIVITY)     EKG EKG reviewed interpreted myself shows sinus rhythm normal axis normal intervals inverted T wave in V2 biphasic T wave in V3   RADIOLOGY I reviewed and interpreted the CXR which does not show any acute cardiopulmonary process   PROCEDURES:  Critical Care performed: No  Procedures  The patient is on the cardiac monitor to evaluate for evidence of arrhythmia and/or significant heart rate changes.   MEDICATIONS ORDERED IN ED: Medications - No data to display   IMPRESSION / MDM / [place] / ED COURSE  I reviewed the triage vital signs and the  nursing notes.                              Patient's presentation is most consistent with acute complicated illness / injury requiring diagnostic workup.  Differential diagnosis includes, but is not limited to, orthostatic hypotension, vasovagal episode, anemia, electrode abnormality, dehydration, less likely ACS  Patient is a 62 year old male with significant cardiac history who presents after an episode of lightheadedness/presyncope.  Patient was seated and when he stood up felt presyncopal but did not actually syncopized felt somewhat warm and sweaty did feel improved upon sitting down but continued to have symptoms for about an hour.  He had no associated chest pain or dyspnea.  Patient tells me has had episodes like this in the past.  Patient's vital signs are reassuring he looks well he is asymptomatic currently.  EKG does have 2 inversions in the septal leads similar to prior.  Troponin was obtained from triage this is negative otherwise CBC and BMP are reassuring no anemia or electrode abnormality.  His symptomatology is not consistent with ischemia.  Suspect either orthostatic or vasovagal episode.  Given patient is feeling improved and has been able to ambulate without feeling presyncopal was otherwise reassuring workup I do think is appropriate to be discharged.  He does have follow-up with Dr. Mariea Clonts tomorrow.  We discussed return precautions.       FINAL CLINICAL IMPRESSION(S) / ED DIAGNOSES   Final diagnoses:  Dizziness     Rx / DC Orders   ED Discharge Orders     None        Note:  This document was prepared using Dragon voice recognition software and may include unintentional dictation errors.   Rada Hay, MD 08/18/22 [phone]

## 2022-08-19 ENCOUNTER — Ambulatory Visit: Payer: Self-pay

## 2022-08-19 ENCOUNTER — Ambulatory Visit: Payer: Self-pay | Attending: Cardiology

## 2022-08-19 DIAGNOSIS — N644 Mastodynia: Secondary | ICD-10-CM

## 2022-08-22 ENCOUNTER — Telehealth: Payer: Self-pay | Admitting: Cardiology

## 2022-08-22 NOTE — Telephone Encounter (Signed)
Charlsie Quest, NP 08/21/2022  1:57 PM EDT     No significant arterial obstruction noted on the right or left upper extremity. Overall reassuring study.

## 2022-08-22 NOTE — Telephone Encounter (Signed)
Attempted to contact the patient with his results.  No answer (no DPR on file)- I left a message to please call back.

## 2022-08-26 NOTE — Telephone Encounter (Signed)
Reviewed results with patient and he verbalized understanding with no further questions at this time. 

## 2022-09-08 ENCOUNTER — Ambulatory Visit: Payer: Managed Care, Other (non HMO) | Attending: Cardiovascular Disease | Admitting: Medical

## 2022-09-08 NOTE — Progress Notes (Deleted)
Cardiology Office Note:    Date:  09/08/2022   ID:  Jeffery Sweeney, DOB November 24, 1960, MRN [id number]  PCP:  System, Provider Not In  [facility] Cardiologist:  Lorine Bears, MD  [facility] Electrophysiologist:  None   Referring MD: No ref. provider found   Chief Complaint: 105-month follow-up  History of Present Illness:    Jeffery Sweeney is a 62 y.o. male with a hx of hypertension, tobacco use, hyperlipidemia, coronary artery disease status post CABG, CVA who is here for follow-up.  She reports previous PCI in [place] in 2019.  He presented in February 2022 with unstable angina.  Cardiac cath showed significant three-vessel CAD.  Echo showed normal LVSF.  He underwent CABG x 3 with LIMA to LAD, SVG to right PDA, and SVG to ramus.  Patient was evaluated in May 2023 for recurrent angina and underwent cardiac cath which showed severe underlying three-vessel CAD with patent grafts.  The left circumflex had significant obstructive disease and was not bypassed due to small vessel size.  He had emergent aspiration thrombectomy and drug-eluting stent placement.  He was placed on Aggrastat but had recurrent chest pain and EKG changes after approximately 6 hours and underwent repeat emergent cardiac cath which showed acute closure of the LIMA to LAD graft beyond the previously stented segment due to what seemed to be spiral dissection throughout the whole graft to the stenosis.  This was treated successfully with aspiration thrombectomy and 7 overlapping drug-eluting stents to cover the whole length of the LIMA into the native LAD just beyond the anastomosis.  He was switched from Brilinta to Plavix due to cost.  Echo Sep 24, 2021 showed LVEF D to 55%, mild LVH, trivial MR.  Patient was seen in November 2023 with complaints of chest tightness and discomfort.  He was scheduled for [facility] and started on Imdur.  Lexiscan Myoview showed normal LV perfusion with stress EF of 71%,  findings consistent with no prior ischemia.  He was last seen May 02, 2022 and was overall feeling fairly well.  Today,  Past Medical History:  Diagnosis Date   CVA (cerebral vascular accident) 06/2021   Small acute/subacute infarcts in the left occipital lobe and   Hyperlipidemia LDL goal <70    started on Repatha 09/26/2021, continue Crestor 40mg  daily   Hypertension    Multiple vessel coronary artery disease    Myocardial infarct    x 3; PCI with single stent placement for each (in [place])   NSTEMI (non-ST elevated myocardial infarction)    LHC 2/18 with multivessel CAD and transfer to [facility] for CABG eval   STEMI (ST elevation myocardial infarction) 09/23/2021   LHC 09/23/2021 x 2   Tobacco use    2 cigarettes daily   Unstable angina    LHC 09/23/2021 x 2; total 8 DES to LIMA    Past Surgical History:  Procedure Laterality Date   CORONARY ANGIOPLASTY WITH STENT PLACEMENT     in [place] in 2018-19   CORONARY ARTERY BYPASS GRAFT N/A 07/09/2020   Procedure: CORONARY ARTERY BYPASS GRAFTING (CABG) x THREE , USING LEFT INTERNAL MAMMARY ARTERY, AND RIGHT LEG GREATER SAPHENOUS VEIN HARVESTED ENDOSCOPICALLY;  Surgeon: Delight Ovens, MD;  Location: [facility] OR;  Service: Open Heart Surgery;  Laterality: N/A;   CORONARY PRESSURE/FFR STUDY N/A 07/06/2020   Procedure: INTRAVASCULAR PRESSURE WIRE/FFR STUDY;  Surgeon: Iran Ouch, MD;  Location: [facility] INVASIVE CV LAB;  Service: Cardiovascular;  Laterality: N/A;  LAD  CORONARY STENT INTERVENTION N/A 09/23/2021   Procedure: CORONARY STENT INTERVENTION;  Surgeon: Iran Ouch, MD;  Location: [facility] INVASIVE CV LAB;  Service: Cardiovascular;  Laterality: N/A;   CORONARY/GRAFT ACUTE MI REVASCULARIZATION N/A 09/23/2021   Procedure: Coronary/Graft Acute MI Revascularization;  Surgeon: Iran Ouch, MD;  Location: [facility] INVASIVE CV LAB;  Service: Cardiovascular;  Laterality: N/A;   LEFT HEART CATH AND CORONARY ANGIOGRAPHY N/A 07/06/2020    Procedure: LEFT HEART CATH AND CORONARY ANGIOGRAPHY;  Surgeon: Iran Ouch, MD;  Location: [facility] INVASIVE CV LAB;  Service: Cardiovascular;  Laterality: N/A;   LEFT HEART CATH AND CORONARY ANGIOGRAPHY N/A 09/23/2021   Procedure: LEFT HEART CATH AND CORONARY ANGIOGRAPHY;  Surgeon: Iran Ouch, MD;  Location: [facility] INVASIVE CV LAB;  Service: Cardiovascular;  Laterality: N/A;   LEFT HEART CATH AND CORS/GRAFTS ANGIOGRAPHY N/A 09/23/2021   Procedure: LEFT HEART CATH AND CORS/GRAFTS ANGIOGRAPHY;  Surgeon: Iran Ouch, MD;  Location: [facility] INVASIVE CV LAB;  Service: Cardiovascular;  Laterality: N/A;   TEE WITHOUT CARDIOVERSION N/A 07/09/2020   Procedure: TRANSESOPHAGEAL ECHOCARDIOGRAM (TEE);  Surgeon: Delight Ovens, MD;  Location: [facility] OR;  Service: Open Heart Surgery;  Laterality: N/A;   WRIST SURGERY Left    for cyst    Current Medications: No outpatient medications have been marked as taking for the 09/08/22 encounter (Appointment) with Fransico Michael, Leiyah Maultsby H, PA-C.     Allergies:   Patient has no known allergies.   Social History   Socioeconomic History   Marital status: Divorced    Spouse name: Not on file   Number of children: Not on file   Years of education: Not on file   Highest education level: Not on file  Occupational History   Not on file  Tobacco Use   Smoking status: Some Days    Packs/day: .5    Types: Cigarettes    Last attempt to quit: 07/06/2020    Years since quitting: 2.1   Smokeless tobacco: Never  Vaping Use   Vaping Use: Never used  Substance and Sexual Activity   Alcohol use: Not Currently   Drug use: Not Currently   Sexual activity: Not on file  Other Topics Concern   Not on file  Social History Narrative   Not on file   Social Determinants of Health   Financial Resource Strain: Not on file  Food Insecurity: Not on file  Transportation Needs: Not on file  Physical Activity: Not on file  Stress: Not on file  Social Connections: Not on file      Family History: The patient's ***family history is not on file.  ROS:   Please see the history of present illness.    *** All other systems reviewed and are negative.  EKGs/Labs/Other Studies Reviewed:    The following studies were reviewed today: ***  EKG:  EKG is *** ordered today.  The ekg ordered today demonstrates ***  Recent Labs: 12/29/2021: Magnesium 2.1 06/17/2022: ALT 18 08/18/2022: BUN 11; Creatinine, Ser 0.96; Hemoglobin 14.0; Platelets 243; Potassium 3.7; Sodium 138  Recent Lipid Panel    Component Value Date/Time   CHOL 99 12/29/2021 0551   TRIG 95 12/29/2021 0551   HDL 34 (L) 12/29/2021 0551   CHOLHDL 2.9 12/29/2021 0551   VLDL 19 12/29/2021 0551   LDLCALC 46 12/29/2021 0551     Risk Assessment/Calculations:   {Does this patient have ATRIAL FIBRILLATION?:[phone]}   Physical Exam:    VS:  There were no vitals taken  for this visit.    Wt Readings from Last 3 Encounters:  07/21/22 205 lb (93 kg)  06/17/22 206 lb (93.4 kg)  05/02/22 206 lb 9.6 oz (93.7 kg)     GEN: *** Well nourished, well developed in no acute distress HEENT: Normal NECK: No JVD; No carotid bruits LYMPHATICS: No lymphadenopathy CARDIAC: ***RRR, no murmurs, rubs, gallops RESPIRATORY:  Clear to auscultation without rales, wheezing or rhonchi  ABDOMEN: Soft, non-tender, non-distended MUSCULOSKELETAL:  No edema; No deformity  SKIN: Warm and dry NEUROLOGIC:  Alert and oriented x 3 PSYCHIATRIC:  Normal affect   ASSESSMENT:    No diagnosis found. PLAN:    In order of problems listed above:  ***  Disposition: Follow up {follow up:15908} with ***   Shared Decision Making/Informed Consent   {Are you ordering a CV Procedure (e.g. stress test, cath, DCCV, TEE, etc)?   Press F2        :[id number]}    Signed, Sharyn Brilliant Ardelle Lesches  09/08/2022 7:48 AM    [facility]

## 2022-09-15 ENCOUNTER — Telehealth: Payer: Self-pay | Admitting: Cardiovascular Disease

## 2022-09-15 MED ORDER — METOPROLOL TARTRATE 50 MG PO TABS: 50.0000 mg | ORAL_TABLET | Freq: Two times a day (BID) | ORAL | 0 refills | Status: DC

## 2022-09-15 MED ORDER — CLOPIDOGREL BISULFATE 75 MG PO TABS: 75.0000 mg | ORAL_TABLET | Freq: Every day | ORAL | 0 refills | Status: DC

## 2022-09-15 MED ORDER — PANTOPRAZOLE SODIUM 40 MG PO TBEC: 40.0000 mg | DELAYED_RELEASE_TABLET | Freq: Every day | ORAL | 0 refills | Status: DC

## 2022-09-15 NOTE — Telephone Encounter (Signed)
Requested Prescriptions   Signed Prescriptions Disp Refills   clopidogrel (PLAVIX) 75 MG tablet 30 tablet 0    Sig: Take 1 tablet (75 mg total) by mouth daily.    Authorizing Provider: Lorine Bears A    Ordering User: Iverson Alamin C   metoprolol tartrate (LOPRESSOR) 50 MG tablet 60 tablet 0    Sig: Take 1 tablet (50 mg total) by mouth 2 (two) times daily.    Authorizing Provider: Lorine Bears A    Ordering User: Iverson Alamin C   pantoprazole (PROTONIX) 40 MG tablet 30 tablet 0    Sig: Take 1 tablet (40 mg total) by mouth once daily.    Authorizing Provider: Lorine Bears A    Ordering User: Kendrick Fries

## 2022-09-15 NOTE — Telephone Encounter (Signed)
Spoke to patient and informed him that a Rx was sent to his pharmacy of choice for the metoprolol tartrate (LOPRESSOR) 50 MG tablet and that he could take the expiring ones back to the pharmacy. Patient understood with read back

## 2022-09-15 NOTE — Telephone Encounter (Signed)
Pt states he has 25mg  tablets of metoprolol tablets that expire on 09/27/22. He wants to know if these are safe to take 2x a day. Please advise.

## 2022-09-15 NOTE — Telephone Encounter (Signed)
*  STAT* If patient is at the pharmacy, call can be transferred to refill team.   1. Which medications need to be refilled? (please list name of each medication and dose if known)  pantoprazole (PROTONIX) 40 MG tablet  clopidogrel (PLAVIX) 75 MG tablet metoprolol tartrate (LOPRESSOR) 50 MG tablet  2. Which pharmacy/location (including street and city if local pharmacy) is medication to be sent to? [facility] - [place] ([address]), [place] - [address]    3. Do they need a 30 day or 90 day supply? 30 day supply  Patient is out of meds. He is scheduled for an appt tomorrow morning.

## 2022-09-16 ENCOUNTER — Encounter: Payer: Self-pay | Admitting: Cardiology

## 2022-09-16 ENCOUNTER — Ambulatory Visit: Payer: Managed Care, Other (non HMO) | Attending: Cardiology | Admitting: Cardiology

## 2022-09-16 VITALS — BP 140/88 | HR 70 | Ht 63.0 in | Wt 204.4 lb

## 2022-09-16 DIAGNOSIS — I25118 Atherosclerotic heart disease of native coronary artery with other forms of angina pectoris: Secondary | ICD-10-CM

## 2022-09-16 DIAGNOSIS — I10 Essential (primary) hypertension: Secondary | ICD-10-CM

## 2022-09-16 DIAGNOSIS — R5383 Other fatigue: Secondary | ICD-10-CM

## 2022-09-16 DIAGNOSIS — Z951 Presence of aortocoronary bypass graft: Secondary | ICD-10-CM

## 2022-09-16 DIAGNOSIS — R0609 Other forms of dyspnea: Secondary | ICD-10-CM

## 2022-09-16 DIAGNOSIS — E782 Mixed hyperlipidemia: Secondary | ICD-10-CM

## 2022-09-16 DIAGNOSIS — Z720 Tobacco use: Secondary | ICD-10-CM

## 2022-09-16 MED ORDER — ISOSORBIDE MONONITRATE ER 30 MG PO TB24: 30.0000 mg | ORAL_TABLET | Freq: Two times a day (BID) | ORAL | 3 refills | Status: DC

## 2022-09-16 NOTE — Progress Notes (Signed)
Cardiology Office Note:   Date:  09/16/2022  ID:  [name], DOB Oct 02, 1960, MRN [id number]  History of Present Illness:   Jeffery Sweeney is a 62 y.o. male with past medical history of essential hypertension, tobacco use, hyperlipidemia, coronary artery disease status post CABG, CVA, who is here today to follow-up on his coronary artery disease with recent complaints of chest discomfort.  He presented in February 2022 with unstable angina.  Cardiac catheterization showed significant three-vessel coronary artery disease.  Echocardiogram revealed normal LV systolic function.  He underwent CABG x 3 LIMA to LAD, SVG to right PDA, SVG to ramus.  He was evaluated in May for recurrent anginal symptoms and underwent cardiac catheterization which showed severe underlying three-vessel coronary artery disease with patent grafts.  Left circumflex had significant obstructive disease but was not bypassed due to small vessel size.  He had an emergent aspiration thrombectomy and DES.  He was placed on Aggrastat but with recurrent chest pain and EKG changes after approximately 6 hours he underwent repeat emergent cardiac catheterization which revealed acute closure of the LIMA to LAD graft beyond the previously stented segment due to what seems to be spiral dissection throughout the whole graft.  This was treated successfully with aspiration doctor.  In 7 over left drug-eluting stents to cover the whole length of the LIMA to the native LAD just beyond the anastomosis.  He was switched from Brilinta to clopidogrel due to cost.  He was again hospitalized in August after presenting with chest pain, cough, shortness of breath.  He was noted to be febrile and tachycardic and was found to have pneumonia and Enterobacter bacteremia from unclear source.  He was treated with antibiotics and had improvement in symptoms.  Echocardiogram completed 09/24/2021 revealed an LVEF of 50-55%, demonstrated wall motion  abnormalities, LVH, and trivial mitral regurgitation with aortic valve sclerosis without evidence of stenosis.  Repeat echocardiogram done 10/02/2021 revealed LVEF 55 to 60%, no regional wall motion abnormalities, mild left ventricular hypertrophy, G1 DD, aortic valve sclerosis without stenosis.  He was last seen in clinic 05/02/2022 stating he was overall doing fairly well.  He had denied any chest pain with initiation of Imdur previously.  He had been scheduled for an ultrasound due to some muscular pain that he was suffering in the left axillary area.  No other changes to his medications were noted at that time.  He was evaluated in the emergency department at [facility] 07/21/2022 with complaints of chest pain.  Blood pressure was noted to be 165/92, he complains of some slight chest tightness and some shortness of breath the day before.  Reassuring physical exam, vital signs, EKG was overall reassuring, clear chest x-ray, high-sensitivity troponins were negative he was treated with a GI cocktail for suspected more GI issues and was discharged home with cardiology follow-up.  Was evaluated again in the emergency room 08/18/2022 for dizziness and shortness of breath.  He had presented after an episode of lightheadedness/presyncope.  He stated that when he stood up he felt presyncopal did not actually syncopized.  Swelling did feel improved upon sitting but continued to have symptoms sharp or.  No associated chest pain or dyspnea.  Workup was unrevealing in the emergency department.  He returns to clinic today with complaints of worsening fatigue associated shortness of breath.  He states that he had an episode of chest discomfort which required a visit to the emergency department was advised that his blood pressure was elevated and that  his workup was negative and he was subsequently sent home.  He has an occasional chest discomfort during episodes of shortness of breath that he has used his as needed inhaler with  improvement in symptoms.  He has not tried any of his Nitrostat during the episodes of chest tightness that he has suffered from.  He is chest pain-free today, has exertional shortness of breath, continues to smoke, does have chronic lower back and leg pain.   ROS: 10 point review of systems has been reviewed is considered negative with exception of what is listed in the HPI  Studies Reviewed:    EKG:  sinus rhythm rate of 70, LVH, no acute changes from prior studies   Myoview 04/09/22 Narrative & Impression      The study is low risk.   No ST deviation was noted.   LV perfusion is normal.   Left ventricular function is normal. calculated LVEF is 54%   Patient is s/p cabg x 3.   TTE 10/02/21  1. Left ventricular ejection fraction, by estimation, is 55 to 60%. The  left ventricle has normal function. The left ventricle has no regional  wall motion abnormalities. There is mild left ventricular hypertrophy.  Left ventricular diastolic parameters  are consistent with Grade I diastolic dysfunction (impaired relaxation).   2. Right ventricular systolic function is normal. The right ventricular  size is normal.   3. The mitral valve is normal in structure. No evidence of mitral valve  regurgitation.   4. The aortic valve is tricuspid. Aortic valve regurgitation is not  visualized. Aortic valve sclerosis/calcification is present, without any  evidence of aortic stenosis.   LHC 09/23/21   Dist RCA lesion is 30% stenosed.   2nd Diag lesion is 80% stenosed.   1st Diag lesion is 90% stenosed.   Lat 1st Diag lesion is 90% stenosed.   RPDA lesion is 90% stenosed.   Mid Cx to Dist Cx lesion is 80% stenosed.   2nd Mrg lesion is 80% stenosed.   Prox LAD to Mid LAD lesion is 80% stenosed.   Prox RCA to Mid RCA lesion is 90% stenosed.   Origin to Mid Graft lesion before Prox RCA  is 30% stenosed.   Origin to Prox Graft lesion is 80% stenosed.   A drug-eluting stent was successfully placed using  a STENT ONYX FRONTIER 3.5X18.   Post intervention, there is a 0% residual stenosis.   SVG.   The graft exhibits mild .   The left ventricular systolic function is normal.   LV end diastolic pressure is mildly elevated.   The left ventricular ejection fraction is 55-65% by visual estimate.   1.  Severe underlying three-vessel coronary artery disease with patent grafts including LIMA to LAD, SVG to diagonal and SVG to right PDA.  The left circumflex has significant disease but is not bypassed due to small vessel size.  The vessel is at most 2 mm in diameter.  The LIMA to LAD had an 80% ostial stenosis with hazy appearance.  There was loss of flow in the LIMA after diagnostic angiography likely due to disruption of plaque and vessel closure. 2.  Normal LV systolic function mildly elevated left ventricular end-diastolic pressure. 3.  Successful angioplasty, drug-eluting stent placement and aspiration thrombectomy to LIMA to LAD with restoration of TIMI-3 flow.  Residual thrombus was still present and thus Aggrastat infusion was started and will be continued for 18 hours.   Recommendations: Dual antiplatelet therapy for  at least 12 months. Aggressive treatment of risk factors. We will monitor the patient closely in the stepdown ICU given increased risk of acute vessel closure. Risk Assessment/Calculations:     HYPERTENSION CONTROL Vitals:   09/16/22 0954 09/16/22 1015  BP: (!) 136/92 (!) 140/88    The patient's blood pressure is elevated above target today.  In order to address the patient's elevated BP: A current anti-hypertensive medication was adjusted today.           Physical Exam:   VS:  BP (!) 140/88 (BP Location: Left Arm, Patient Position: Sitting, Cuff Size: Normal)   Pulse 70   Ht 5\' 3"  (1.6 m)   Wt 204 lb 6.4 oz (92.7 kg)   SpO2 98%   BMI 36.21 kg/m    Wt Readings from Last 3 Encounters:  09/16/22 204 lb 6.4 oz (92.7 kg)  07/21/22 205 lb (93 kg)  06/17/22 206 lb (93.4  kg)     GEN: Well nourished, well developed in no acute distress NECK: No JVD; No carotid bruits CARDIAC: RRR, no murmurs, rubs, gallops RESPIRATORY:  Clear to auscultation without rales, wheezing or rhonchi  ABDOMEN: Soft, non-tender, non-distended EXTREMITIES:  No edema; No deformity   ASSESSMENT AND PLAN:   Coronary artery disease involving native coronary artery status post CABG x 3 with angina.  Recent left heart catheterization revealed we will revisit with DES placement and aspiration thrombectomy of the LIMA to the LAD graft followed by partial heart catheter showed spiral dissection throughout the length of the LIMA which required 7 overlapping DES.  He underwent a Lexiscan Myoview in 03/2022 which revealed no ischemia.  He continued to have an episode of chest discomfort that he was told by the emergency department was related to his elevated blood pressure.  Has been using his inhaler with mild relief of chest tightness and associated shortness of breath since that time.  He has been continued on aspirin 81 mg daily, clopidogrel 75 mg daily, Imdur was been increased to 30 mg twice daily, Crestor 40 mg daily, Repatha 140 mg every 2 weeks.  He has been encouraged for any continued episodes of chest discomfort to try his Nitrostat as his recent emergency department visit he had an unrevealing workup with negative high-sensitivity troponins and unchanged EKG.  Fatigue with associated shortness of breath.  That has been ongoing.  Unfortunately does continue to smoke.  He has been scheduled for an echocardiogram to evaluate heart function as well as wall motion.  Essential hypertension with blood pressure today 136/92, recheck 140/88.  He is continued on metoprolol tartrate 50 mg twice daily.  We are also increasing Imdur to 30 mg twice daily.  He has been encouraged to monitor his blood pressure at home and at work.  Hyperlipidemia with last LDL 46 with a goal of less than 70.  He is continued  on rosuvastatin and Repatha.  Continued tobacco abuse with shortness of breath total cessation is recommended.  Disposition patient return to clinic to see MD/APP once echocardiogram is completed or sooner if continued episodes of chest discomfort.  He has been advised to use his Nitrostat if he continues to have episodes of chest discomfort will need to be scheduled for repeat left heart catheterization.        Signed, Machell Wirthlin, NP

## 2022-09-16 NOTE — Addendum Note (Signed)
Addended by: Auburn Bilberry D on: 09/16/2022 10:49 AM   Modules accepted: Orders

## 2022-09-16 NOTE — Patient Instructions (Signed)
Medication Instructions:  Your physician has recommended you make the following change in your medication:   INCREASE Isosorbide mononitrate 30 mg twice a day.   *If you need a refill on your cardiac medications before your next appointment, please call your pharmacy*   Lab Work: None  If you have labs (blood work) drawn today and your tests are completely normal, you will receive your results only by: MyChart Message (if you have MyChart) OR A paper copy in the mail If you have any lab test that is abnormal or we need to change your treatment, we will call you to review the results.   Testing/Procedures: Your physician has requested that you have an echocardiogram. Echocardiography is a painless test that uses sound waves to create images of your heart. It provides your doctor with information about the size and shape of your heart and how well your heart's chambers and valves are working. This procedure takes approximately one hour. There are no restrictions for this procedure. Please do NOT wear cologne, perfume, aftershave, or lotions (deodorant is allowed). Please arrive 15 minutes prior to your appointment time.    Follow-Up: At [facility], you and your health needs are our priority.  As part of our continuing mission to provide you with exceptional heart care, we have created designated Provider Care Teams.  These Care Teams include your primary Cardiologist (physician) and Advanced Practice Providers (APPs -  Physician Assistants and Nurse Practitioners) who all work together to provide you with the care you need, when you need it.   Your next appointment:   Follow up after echocardiogram  Provider:   Lorine Bears, MD or Charlsie Quest, NP

## 2022-10-16 ENCOUNTER — Telehealth: Payer: Self-pay | Admitting: Cardiovascular Disease

## 2022-10-16 MED ORDER — PANTOPRAZOLE SODIUM 40 MG PO TBEC: 40.0000 mg | DELAYED_RELEASE_TABLET | Freq: Every day | ORAL | 2 refills | Status: DC

## 2022-10-16 MED ORDER — METOPROLOL TARTRATE 50 MG PO TABS: 50.0000 mg | ORAL_TABLET | Freq: Two times a day (BID) | ORAL | 2 refills | Status: DC

## 2022-10-16 MED ORDER — ROSUVASTATIN CALCIUM 40 MG PO TABS: 40.0000 mg | ORAL_TABLET | Freq: Every day | ORAL | 2 refills | Status: DC

## 2022-10-16 MED ORDER — CLOPIDOGREL BISULFATE 75 MG PO TABS: 75.0000 mg | ORAL_TABLET | Freq: Every day | ORAL | 2 refills | Status: DC

## 2022-10-16 NOTE — Telephone Encounter (Signed)
*  STAT* If patient is at the pharmacy, call can be transferred to refill team.   1. Which medications need to be refilled? (please list name of each medication and dose if known) clopidogrel (PLAVIX) 75 MG tablet   metoprolol tartrate (LOPRESSOR) 50 MG tablet  pantoprazole (PROTONIX) 40 MG tablet  rosuvastatin (CRESTOR) 40 MG tablet    2. Which pharmacy/location (including street and city if local pharmacy) is medication to be sent to?  [facility] - [place] ([address]), [place] - [address]    3. Do they need a 30 day or 90 day supply? 30 day   Pt is out of medications

## 2022-10-16 NOTE — Telephone Encounter (Signed)
Requested Prescriptions   Signed Prescriptions Disp Refills   pantoprazole (PROTONIX) 40 MG tablet 30 tablet 2    Sig: Take 1 tablet (40 mg total) by mouth once daily.    Authorizing Provider: Lorine Bears A    Ordering User: Thayer Headings, Talyah Seder L   rosuvastatin (CRESTOR) 40 MG tablet 30 tablet 2    Sig: Take 1 tablet (40 mg total) by mouth daily.    Authorizing Provider: Lorine Bears A    Ordering User: Thayer Headings, Wyatt Thorstenson L   metoprolol tartrate (LOPRESSOR) 50 MG tablet 60 tablet 2    Sig: Take 1 tablet (50 mg total) by mouth 2 (two) times daily.    Authorizing Provider: Lorine Bears A    Ordering User: Thayer Headings, Sejla Marzano L   clopidogrel (PLAVIX) 75 MG tablet 30 tablet 2    Sig: Take 1 tablet (75 mg total) by mouth daily.    Authorizing Provider: Lorine Bears A    Ordering User: Thayer Headings, Calib Wadhwa L

## 2022-10-17 ENCOUNTER — Ambulatory Visit: Payer: Managed Care, Other (non HMO)

## 2022-10-29 ENCOUNTER — Ambulatory Visit: Payer: Managed Care, Other (non HMO) | Admitting: Cardiology

## 2022-11-13 ENCOUNTER — Ambulatory Visit: Payer: Self-pay | Attending: Cardiology

## 2022-11-13 DIAGNOSIS — R0609 Other forms of dyspnea: Secondary | ICD-10-CM

## 2022-11-13 LAB — ECHOCARDIOGRAM COMPLETE
AR max vel: 2.44 cm2
AV Area VTI: 2.39 cm2
AV Area mean vel: 2.5 cm2
AV Mean grad: 4.7 mmHg
AV Peak grad: 8.3 mmHg
Ao pk vel: 1.44 m/s
Area-P 1/2: 3.91 cm2
Calc EF: 56.6 %
S' Lateral: 2.8 cm
Single Plane A2C EF: 56.3 %
Single Plane A4C EF: 54.4 %

## 2022-11-14 ENCOUNTER — Encounter: Payer: Self-pay | Admitting: Emergency Medicine

## 2022-11-14 ENCOUNTER — Emergency Department
Admission: EM | Admit: 2022-11-14 | Discharge: 2022-11-14 | Disposition: A | Discharge status: Home / Self Care | Payer: Self-pay | Attending: Emergency Medicine | Admitting: Emergency Medicine

## 2022-11-14 ENCOUNTER — Other Ambulatory Visit: Payer: Self-pay

## 2022-11-14 DIAGNOSIS — I10 Essential (primary) hypertension: Secondary | ICD-10-CM | POA: Insufficient documentation

## 2022-11-14 DIAGNOSIS — H9311 Tinnitus, right ear: Secondary | ICD-10-CM | POA: Insufficient documentation

## 2022-11-14 DIAGNOSIS — Z8673 Personal history of transient ischemic attack (TIA), and cerebral infarction without residual deficits: Secondary | ICD-10-CM | POA: Insufficient documentation

## 2022-11-14 DIAGNOSIS — H6121 Impacted cerumen, right ear: Secondary | ICD-10-CM | POA: Insufficient documentation

## 2022-11-14 MED ORDER — CETIRIZINE HCL 10 MG PO TABS: 10.0000 mg | ORAL_TABLET | Freq: Every day | ORAL | 2 refills | Status: DC

## 2022-11-14 MED ORDER — FLUTICASONE PROPIONATE 50 MCG/ACT NA SUSP: 2.0000 | Freq: Every day | NASAL | 2 refills | Status: DC

## 2022-11-14 MED ORDER — CARBAMIDE PEROXIDE 6.5 % OT SOLN
10.0000 [drp] | Freq: Once | OTIC | Status: AC
  Administered 2022-11-14: 10 [drp] via OTIC
  Filled 2022-11-14: qty 15

## 2022-11-14 NOTE — ED Provider Notes (Signed)
[facility] Provider Note    Event Date/Time   First MD Initiated Contact with Patient 11/14/22 1032     (approximate)   History   Otalgia   HPI  Kaylen Casamento is a 62 y.o. male with history of hypertension, NSTEMI, CVA, and as listed in EMR presents to the emergency department for evaluation of ringing in the right ear that has been persistent for a week.  He denies recent travel.  He has had no recent URI.  He has had several bouts where he has sneezed multiple times.  He has never had this symptom in the past.  He states is causing headache.  No alleviating measures attempted prior to arrival..      Physical Exam   Triage Vital Signs: ED Triage Vitals  Enc Vitals Group     BP 11/14/22 1010 (!) 170/96     Pulse Rate 11/14/22 1010 74     Resp 11/14/22 1010 18     Temp 11/14/22 1010 97.8 F (36.6 C)     Temp src --      SpO2 11/14/22 1010 96 %     Weight 11/14/22 1027 204 lb 5.9 oz (92.7 kg)     Height 11/14/22 1027 5\' 3"  (1.6 m)     Head Circumference --      Peak Flow --      Pain Score 11/14/22 1010 7     Pain Loc --      Pain Edu? --      Excl. in GC? --     Most recent vital signs: Vitals:   11/14/22 1010  BP: (!) 170/96  Pulse: 74  Resp: 18  Temp: 97.8 F (36.6 C)  SpO2: 96%    General: Awake, no distress.  CV:  Good peripheral perfusion.  Resp:  Normal effort.  Abd:  No distention.  Other:  Right side TM obscured by cerumen   ED Results / Procedures / Treatments   Labs (all labs ordered are listed, but only abnormal results are displayed) Labs Reviewed - No data to display   EKG  Not indicated   RADIOLOGY  Image and radiology report reviewed and interpreted by me. Radiology report consistent with the same.  Not indicated  PROCEDURES:  Critical Care performed: No  Procedures   MEDICATIONS ORDERED IN ED:  Medications  carbamide peroxide (DEBROX) 6.5 % OTIC (EAR) solution 10 drop (10 drops  Right EAR Given 11/14/22 1113)     IMPRESSION / MDM / ASSESSMENT AND PLAN / ED COURSE   I have reviewed the triage note.  Differential diagnosis includes, but is not limited to, tinnitus, eustachian tube dysfunction, tympanic membrane rupture  Patient's presentation is most consistent with acute illness / injury with system symptoms.  62 year old male presenting to the emergency department for treatment and evaluation of tinnitus.  See HPI for further details.  On exam, he has a cerumen impaction in the right ear.  Ear irrigation ordered and then I will reassess.  Cerumen impaction removed by nursing staff.  Tympanic membrane reassessed.  Eardrum is retracted.  Plan will be to treat him with Flonase and Zyrtec.  He will be advised to follow-up with primary care or ENT if tinnitus continues.  If symptoms change or worsen and he is unable to schedule an appointment, he is to return to the emergency department.      FINAL CLINICAL IMPRESSION(S) / ED DIAGNOSES   Final diagnoses:  Impacted  cerumen of right ear  Tinnitus of right ear     Rx / DC Orders   ED Discharge Orders          Ordered    fluticasone (FLONASE) 50 MCG/ACT nasal spray  Daily        11/14/22 1135    cetirizine (ZYRTEC ALLERGY) 10 MG tablet  Daily        11/14/22 1135             Note:  This document was prepared using Dragon voice recognition software and may include unintentional dictation errors.   Chinita Pester, FNP 11/14/22 1355    Chesley Noon, MD 11/15/22 [phone]

## 2022-11-14 NOTE — ED Triage Notes (Signed)
Pt comes with c/o ringing in his ear. Pt states it is his right ear. Pt states this started week ago. Pt states it has gotten worse. Pt states it makes his head hurt.

## 2022-11-14 NOTE — ED Notes (Signed)
See triage note  Presents with some ringing in right ear for a few days

## 2022-11-17 NOTE — Progress Notes (Signed)
Heart squeeze 55-60%, the wall motion has remained normal, mild leakage noted in two of the heart valves. Some muscle stiffness noted likely from age or hypertension.

## 2022-11-18 ENCOUNTER — Telehealth: Payer: Self-pay | Admitting: *Deleted

## 2022-11-18 NOTE — Telephone Encounter (Signed)
-----   Message from Charlsie Quest, NP sent at 11/17/2022  3:02 PM EDT ----- Heart squeeze 55-60%, the wall motion has remained normal, mild leakage noted in two of the heart valves. Some muscle stiffness noted likely from age or hypertension.

## 2022-11-18 NOTE — Telephone Encounter (Signed)
Left voicemail message to call back  

## 2022-11-21 ENCOUNTER — Ambulatory Visit: Payer: Self-pay | Attending: Cardiology | Admitting: Cardiology

## 2022-11-21 ENCOUNTER — Encounter: Payer: Self-pay | Admitting: Cardiology

## 2022-11-21 VITALS — BP 135/88 | HR 79 | Ht 63.0 in | Wt 199.0 lb

## 2022-11-21 DIAGNOSIS — I25118 Atherosclerotic heart disease of native coronary artery with other forms of angina pectoris: Secondary | ICD-10-CM

## 2022-11-21 DIAGNOSIS — E782 Mixed hyperlipidemia: Secondary | ICD-10-CM

## 2022-11-21 DIAGNOSIS — I10 Essential (primary) hypertension: Secondary | ICD-10-CM

## 2022-11-21 DIAGNOSIS — R5383 Other fatigue: Secondary | ICD-10-CM

## 2022-11-21 DIAGNOSIS — Z720 Tobacco use: Secondary | ICD-10-CM

## 2022-11-21 DIAGNOSIS — Z951 Presence of aortocoronary bypass graft: Secondary | ICD-10-CM

## 2022-11-21 DIAGNOSIS — H9319 Tinnitus, unspecified ear: Secondary | ICD-10-CM

## 2022-11-21 MED ORDER — ISOSORBIDE MONONITRATE ER 60 MG PO TB24: 60.0000 mg | ORAL_TABLET | Freq: Two times a day (BID) | ORAL | 3 refills | Status: DC

## 2022-11-21 NOTE — Progress Notes (Signed)
Cardiology Office Note:  .   Date:  11/21/2022  ID:  Jeffery Sweeney, DOB 02/04/1961, MRN [id number] PCP: Aviva Kluver  [facility] Providers Cardiologist:  Lorine Bears, MD Cardiology APP:  Fransico Michael, Cadence H, PA-C    History of Present Illness: .   Jeffery Sweeney is a 62 y.o. male with a past medical history of essential hypertension, tobacco use, hyperlipidemia, coronary artery disease status post CABG, CVA who is here today to follow-up on his coronary artery disease.  He originally presented in February 2022 with unstable angina.  Diagnostic heart catheterization revealed significant three-vessel coronary artery disease.  Echocardiogram revealed normal LV systolic function.  He underwent CABG x 3 (LIMA-LAD, SVG/PDA, SVG-ramus).  He was reevaluated in May for recurrent anginal symptoms and underwent cardiac catheterization which showed severe underlying three-vessel coronary artery disease with patent grafts.  Left circumflex had significant obstructive disease but was not bypassed due to small vessel size.  He had an emergent aspiration thrombectomy and DES.  He was placed on Aggrastat but with recurrent chest pain and EKG changes after approximately 6 hours and underwent repeat emergent catheterization which revealed acute closure of the LIMA-LAD graft beyond the previously stented segment due to what seems to be a spiral dissection throughout the whole graft.  This was treated successfully with aspiration.  He had 7 overlapping DES to cover the whole length of the LIMA to the native LAD just beyond the anastomosis.  He was switched from Brilinta to clopidogrel due to cost.  He was hospitalized again in August after presenting with chest pain, cough, shortness of breath.  He was noted to be febrile and tachycardic and was found to have pneumonia and Enterobacter bacteremia from unclear source.  Echocardiogram revealed an LVEF of 50 to 55%, LVH, trivial mitral regurgitation as well as  aortic valve sclerosis without evidence of stenosis.  Repeat echocardiogram done 10/02/2021 revealed LVEF 55 to 60%, no regional wall motion abnormalities, mild left ventricular hypertrophy, G1 DD, aortic valve sclerosis without stenosis.  He was evaluated in the emergency department [facility] 3//24 with complaints of chest pain.  He was noted to be hypertensive with blood pressure 165/92 and complained of slight chest tightness.  Shortness of breath the day before.  He had reassuring physical exam, vital signs, EKG was overall reassuring, clear chest x-ray, high-sensitivity troponins were negative and he was treated with GI cocktail for suspected GI issues and discharged home with cardiology follow-up.  He was then reevaluated in the emergency department on 08/18/22 for dizziness and shortness of breath.  He presented after an episode of lightheadedness/presyncope.  He stated recent episode of presyncopal did not actually syncopized.  Was unrevealing in the emergency department and he was subsequently discharged home.  He was last seen in clinic 09/16/2022 with complaints of worsening fatigue and associated shortness of breath.  He stated he had an episode of chest discomfort requiring visit to the emergency department and was advised that his blood pressure was elevated and that his workup was negative and he was subsequently sent home.  At that time he was scheduled for repeat echocardiogram.  He was evaluated in the [facility] emergency department on 11/14/2022 for otalgia.  He had stated that he had been in pain ringing in the right ear that been persistent for a week.  He had several bouts.  Sneezed multiple times.  Blood pressure was found to be elevated at 170/96.  He was noted to have a cerumen impaction  removed by the nursing staff.  He was treated with Flonase and Zyrtec and advised to follow-up with his PCP and ENT if he continued with episodes of tinnitus.  He returns to clinic today with complaints of continued  fatigue and insomnia chest tightness when waking in the morning shortness of breath on exertion where he continues to use nasal and continued tinnitus.  He is continued chest tightness and discomfort but he is not tried using any of his Nitrostat.  States that his chest discomfort was just primarily when he gets up in the morning.  It is not always associated with rest or with exertion.  Continues to complain of shortness of breath on exertion but denies any weight gain, PND, and orthopnea.  He continues to use his inhaler on occasion which is consistent.  He also stated that he has not been sleeping well.  Denies signs any hospitalizations but did have an emergency department visit for tinnitus  ROS: 10 point review of systems has been reviewed and considered negative with exception of what is listed in the HPI  Studies Reviewed: Marland Kitchen        TTE 11/13/22 1. Left ventricular ejection fraction, by estimation, is 55 to 60%. The  left ventricle has normal function. The left ventricle has no regional  wall motion abnormalities. Left ventricular diastolic parameters are  consistent with Grade I diastolic  dysfunction (impaired relaxation). The average left ventricular global  longitudinal strain is -17.0 %.   2. Right ventricular systolic function is normal. The right ventricular  size is normal. Tricuspid regurgitation signal is inadequate for assessing  PA pressure.   3. The mitral valve is normal in structure. Mild mitral valve  regurgitation. No evidence of mitral stenosis.   4. The aortic valve has an indeterminant number of cusps. Aortic valve  regurgitation is mild. Aortic valve sclerosis is present, with no evidence  of aortic valve stenosis.   5. The inferior vena cava is normal in size with greater than 50%  respiratory variability, suggesting right atrial pressure of 3 mmHg.    Myoview 04/09/22 Narrative & Impression      The study is low risk.   No ST deviation was noted.   LV  perfusion is normal.   Left ventricular function is normal. calculated LVEF is 54%   Patient is s/p cabg x 3.    TTE 10/02/21  1. Left ventricular ejection fraction, by estimation, is 55 to 60%. The  left ventricle has normal function. The left ventricle has no regional  wall motion abnormalities. There is mild left ventricular hypertrophy.  Left ventricular diastolic parameters  are consistent with Grade I diastolic dysfunction (impaired relaxation).   2. Right ventricular systolic function is normal. The right ventricular  size is normal.   3. The mitral valve is normal in structure. No evidence of mitral valve  regurgitation.   4. The aortic valve is tricuspid. Aortic valve regurgitation is not  visualized. Aortic valve sclerosis/calcification is present, without any  evidence of aortic stenosis.    LHC 09/23/21   Dist RCA lesion is 30% stenosed.   2nd Diag lesion is 80% stenosed.   1st Diag lesion is 90% stenosed.   Lat 1st Diag lesion is 90% stenosed.   RPDA lesion is 90% stenosed.   Mid Cx to Dist Cx lesion is 80% stenosed.   2nd Mrg lesion is 80% stenosed.   Prox LAD to Mid LAD lesion is 80% stenosed.   Prox RCA  to Mid RCA lesion is 90% stenosed.   Origin to Mid Graft lesion before Prox RCA  is 30% stenosed.   Origin to Prox Graft lesion is 80% stenosed.   A drug-eluting stent was successfully placed using a STENT ONYX FRONTIER 3.5X18.   Post intervention, there is a 0% residual stenosis.   SVG.   The graft exhibits mild .   The left ventricular systolic function is normal.   LV end diastolic pressure is mildly elevated.   The left ventricular ejection fraction is 55-65% by visual estimate.   1.  Severe underlying three-vessel coronary artery disease with patent grafts including LIMA to LAD, SVG to diagonal and SVG to right PDA.  The left circumflex has significant disease but is not bypassed due to small vessel size.  The vessel is at most 2 mm in diameter.  The LIMA to LAD  had an 80% ostial stenosis with hazy appearance.  There was loss of flow in the LIMA after diagnostic angiography likely due to disruption of plaque and vessel closure. 2.  Normal LV systolic function mildly elevated left ventricular end-diastolic pressure. 3.  Successful angioplasty, drug-eluting stent placement and aspiration thrombectomy to LIMA to LAD with restoration of TIMI-3 flow.  Residual thrombus was still present and thus Aggrastat infusion was started and will be continued for 18 hours.   Recommendations: Dual antiplatelet therapy for at least 12 months. Aggressive treatment of risk factors. We will monitor the patient closely in the stepdown ICU given increased risk of acute vessel closure. Risk Assessment/Calculations:             Physical Exam:   VS:  BP 135/88 (BP Location: Left Arm, Patient Position: Sitting, Cuff Size: Normal)   Pulse 79   Ht 5\' 3"  (1.6 m)   Wt 199 lb (90.3 kg)   SpO2 96%   BMI 35.25 kg/m    Wt Readings from Last 3 Encounters:  11/21/22 199 lb (90.3 kg)  11/14/22 204 lb 5.9 oz (92.7 kg)  09/16/22 204 lb 6.4 oz (92.7 kg)    GEN: Well nourished, well developed in no acute distress NECK: No JVD; No carotid bruits CARDIAC: RRR, no murmurs, rubs, gallops RESPIRATORY:  Clear to auscultation without rales, wheezing or rhonchi  ABDOMEN: Soft, non-tender, non-distended EXTREMITIES:  No edema; No deformity   ASSESSMENT AND PLAN: .   Coronary artery disease modality of coronary artery status post CABG x 3 with continued angina.  Recent left heart catheterization revealed DES placement with aspiration thrombectomy of the LIMA-LAD graft followed by repeat catheterization for spiral dissection throughout the length of the LIMA which required 711 DES.  He underwent Lexiscan Myoview in 03/2022 which revealed no ischemia.  He is continued to have episodes of chest discomfort.  She had not taken any of his nitroglycerin.  He is continued to describe it as a tightness  with associated shortness of breath since that time this is unchanged.  He has been continued on aspirin 81 mg daily, clopidogrel 75 mg daily, and Repatha 140 mg every [redacted] weeks along with rosuvastatin 40 mg daily.  He is also continued on Imdur which has been increased to 60 mg twice daily.  Continue fatigue with associated shortness of breath that has been ongoing since his previous visit.  Unfortunately he does continue to smoke and requires the use of any inhalers quite frequently.  Recent echocardiogram revealed LVEF 55 to 60%, no regional wall motion abnormality, G1 DD, mild mitral regurgitation.  Likely has a component of deconditioning as well.  Essential hypertension with blood pressure today 135/88.  Blood pressures have remained well-controlled.  He is continued on metoprolol tartrate 50 mg twice daily, and that has been increased to 60 mg twice daily.  He has been encouraged to continue to monitor his blood pressures at home as well.  Mixed hyperlipidemia with an LDL 46 with a goal of less than 55.  He has been echo he will need an updated lipid panel is being ordered today.  He has been continued on rosuvastatin and Repatha.  Recurrent tinnitus even after emergency department visit.  He has continued to take Flonase and Zyrtec as advised by the emergency department with continued concerns for repetitive tinnitus.  He has been referred to ENT.  Tobacco abuse with decreasing the amount of cigarettes that he is smoking daily.  Total cessation continues to be recommended.       Dispo: Disposition patient return to clinic primary cardiologist Dr. Kirke Corin to reevaluate for chest pain as reviewed previously discussed repeat heart catheterization for continued discomfort.  Signed, Katheryn Culliton, NP

## 2022-11-21 NOTE — Patient Instructions (Signed)
Medication Instructions:   Increase Imdur (Isosorbide mononitrate) to 60 mg twice daily *If you need a refill on your cardiac medications before your next appointment, please call your pharmacy*   Lab Work: Your provider would like for you to return in 1 month to have the following labs drawn: Lipid panel.   Please go to the [facility] entrance and check in at the front desk.  You do not need an appointment.  They are open from 7am-6 pm.  You  need to be fasting.  If you have labs (blood work) drawn today and your tests are completely normal, you will receive your results only by: MyChart Message (if you have MyChart) OR A paper copy in the mail If you have any lab test that is abnormal or we need to change your treatment, we will call you to review the results.   Testing/Procedures: none  Follow-Up: At [facility], you and your health needs are our priority.  As part of our continuing mission to provide you with exceptional heart care, we have created designated Provider Care Teams.  These Care Teams include your primary Cardiologist (physician) and Advanced Practice Providers (APPs -  Physician Assistants and Nurse Practitioners) who all work together to provide you with the care you need, when you need it.  We recommend signing up for the patient portal called "MyChart".  Sign up information is provided on this After Visit Summary.  MyChart is used to connect with patients for Virtual Visits (Telemedicine).  Patients are able to view lab/test results, encounter notes, upcoming appointments, etc.  Non-urgent messages can be sent to your provider as well.   To learn more about what you can do with MyChart, go to [email or web address].    Your next appointment:   1 month(s)  Provider:   Lorine Bears, MD   Other Instructions  Your provider has referred you to ENT

## 2022-11-24 NOTE — Telephone Encounter (Signed)
Reviewed results with patient and he verbalized understanding with no further questions at this time. 

## 2022-12-24 ENCOUNTER — Telehealth: Payer: Self-pay | Admitting: Cardiovascular Disease

## 2022-12-24 NOTE — Telephone Encounter (Signed)
From the log of his blood pressure he needs to continue with his current medication regimen and to ensure that there is not an increase in his blood pressure which can cause several symptoms that he has been suffering from.  Advised him to take his medications and then take his blood pressure 1 to 2 hours after he gets his medications.  We need to make adjustments and we can at that time.  Make sure he remembers that he has a send nitro to take if he continues to have chest discomfort and if we have availability we can try to move his appointment up.  If he continues to have elevated pressures and chest discomfort with associated dizziness would recommend that he follow-up in the emergency department for further evaluation.

## 2022-12-24 NOTE — Telephone Encounter (Signed)
Pt c/o BP issue: STAT if pt c/o blurred vision, one-sided weakness or slurred speech  1. What are your last 5 BP readings? 164/100, 147/100, 140/99  2. Are you having any other symptoms (ex. Dizziness, headache, blurred vision, passed out)? Ear ringing- chest was feeling tight, eased up at this time  3. What is your BP issue? High blood pressure

## 2022-12-24 NOTE — Telephone Encounter (Signed)
Called and spoke with patient. Patient states that yesterday he did not take his morning blood pressure medications in the morning. He said he started feeling dizzy and then took his medication around 3:00 pm. Patient states that today he took his blood pressure medication and about 2 hours later his blood pressure was 164/100. Patient says that he has checked his blood pressure 2 more times today and it was 147/100 and 140/99. Patient states that he had some chest pain earlier but denies current chest pain. Patient states that he still has some dizziness but it has improved. Patient has an appointment on 12/30/22. Patient informed that there are no earlier appointments available. Patient advised that if his chest pain returns or symptoms become worse to go to the Emergency Department. Patient also encouraged to keep a record of blood pressures for upcoming appointment. Patient verbalizes understanding. Will forward to provider for further recommendations.

## 2022-12-24 NOTE — Telephone Encounter (Signed)
Called and spoke with patient. Informed him of the following from Charlsie Quest, NP.  From the log of his blood pressure he needs to continue with his current medication regimen and to ensure that there is not an increase in his blood pressure which can cause several symptoms that he has been suffering from.  Advised him to take his medications and then take his blood pressure 1 to 2 hours after he gets his medications.  We need to make adjustments and we can at that time.  Make sure he remembers that he has a send nitro to take if he continues to have chest discomfort and if we have availability we can try to move his appointment up.  If he continues to have elevated pressures and chest discomfort with associated dizziness would recommend that he follow-up in the emergency department for further evaluation.   Patient verbalizes understanding.

## 2022-12-30 ENCOUNTER — Ambulatory Visit: Payer: Self-pay | Attending: Cardiovascular Disease | Admitting: Cardiovascular Disease

## 2022-12-30 ENCOUNTER — Other Ambulatory Visit
Admission: RE | Admit: 2022-12-30 | Discharge: 2022-12-30 | Disposition: A | Discharge status: Home / Self Care | Payer: Self-pay | Source: Ambulatory Visit | Attending: Cardiology | Admitting: Cardiology

## 2022-12-30 DIAGNOSIS — I25118 Atherosclerotic heart disease of native coronary artery with other forms of angina pectoris: Secondary | ICD-10-CM | POA: Insufficient documentation

## 2022-12-30 LAB — LIPID PANEL
Cholesterol: 121 mg/dL (ref 0–200)
HDL: 28 mg/dL — ABNORMAL LOW (ref >40)
LDL Cholesterol: 18 mg/dL (ref 0–99)
Total CHOL/HDL Ratio: 4.3 RATIO
Triglycerides: 376 mg/dL — ABNORMAL HIGH (ref <150)
VLDL: 75 mg/dL — ABNORMAL HIGH (ref 0–40)

## 2022-12-30 NOTE — Progress Notes (Deleted)
Cardiology Office Note   Date:  12/30/2022   ID:  Jeffery Sweeney, DOB May 17, 1961, MRN [id number]  PCP:  Pcp, No  Cardiologist:   Lorine Bears, MD   No chief complaint on file.     History of Present Illness: Jeffery Sweeney is a 62 y.o. male who presents for a follow-up visit regarding coronary artery disease.  He has known history of essential hypertension, tobacco use and hyperlipidemia. The patient had previous PCI in [place] in 2019.  He presented in February of 2022  with unstable angina.  Cardiac catheterization showed significant three-vessel coronary artery disease.  Echo showed normal LV systolic function.  The patient underwent CABG x3 with LIMA to LAD, SVG to right PDA and SVG to ramus.  He was seen in May for recurrent anginal symptoms.  He underwent cardiac catheterization which showed severe underlying three-vessel coronary artery disease with patent grafts including LIMA to LAD, SVG to diagonal and SVG to right PDA.  The left circumflex had significant obstructive disease and was not bypassed due to small vessel size.  His LIMA to LAD had an 80% still stenosis with hazy appearance.  When the LIMA was engaged, there was acute closure and the graft.  I performed emergent aspiration thrombectomy and drug-eluting stent placement.  He was placed on Aggrastat but had recurrent chest pain and EKG changes after about 6 hours.  Repeat emergent cardiac cath showed acute closure of LIMA to LAD beyond the previously stented segment due to what seemed to be spiral dissection throughout the whole graft to that the stenosis.  This was treated successfully with aspiration thrombectomy and 7 overlapped drug-eluting stents to cover the whole length of the LIMA into the native LAD just beyond the anastomosis.  He was subsequently switched from Brilinta to clopidogrel due to cost. He was hospitalized in August after he presented with chest pain, cough and shortness of breath.   He was noted to be febrile and tachycardic.  He was found to have pneumonia and Enterobacter bacteremia from unclear source.  He improved with antibiotics.  He continues to work part-time at [employer].  He usually has to carry boxes that are about 20 pounds.  He reports substernal chest tightness that started about 2 weeks ago.  The pain is usually exertional and resolves with rest.  In addition, he complains of tenderness in the left nipple with radiation of the pain to the left shoulder.  Past Medical History:  Diagnosis Date   CVA (cerebral vascular accident) (HCC) 06/2021   Small acute/subacute infarcts in the left occipital lobe and   Hyperlipidemia LDL goal <70    started on Repatha 09/26/2021, continue Crestor 40mg  daily   Hypertension    Multiple vessel coronary artery disease    Myocardial infarct (HCC)    x 3; PCI with single stent placement for each (in [place])   NSTEMI (non-ST elevated myocardial infarction) (HCC)    LHC 2/18 with multivessel CAD and transfer to [facility] for CABG eval   STEMI (ST elevation myocardial infarction) (HCC) 09/23/2021   LHC 09/23/2021 x 2   Tobacco use    2 cigarettes daily   Unstable angina (HCC)    LHC 09/23/2021 x 2; total 8 DES to LIMA    Past Surgical History:  Procedure Laterality Date   CORONARY ANGIOPLASTY WITH STENT PLACEMENT     in [place] in 2018-19   CORONARY ARTERY BYPASS GRAFT N/A 07/09/2020   Procedure: CORONARY ARTERY BYPASS  GRAFTING (CABG) x THREE , USING LEFT INTERNAL MAMMARY ARTERY, AND RIGHT LEG GREATER SAPHENOUS VEIN HARVESTED ENDOSCOPICALLY;  Surgeon: Delight Ovens, MD;  Location: [facility] OR;  Service: Open Heart Surgery;  Laterality: N/A;   CORONARY PRESSURE/FFR STUDY N/A 07/06/2020   Procedure: INTRAVASCULAR PRESSURE WIRE/FFR STUDY;  Surgeon: Iran Ouch, MD;  Location: [facility] INVASIVE CV LAB;  Service: Cardiovascular;  Laterality: N/A;  LAD   CORONARY STENT INTERVENTION N/A 09/23/2021   Procedure: CORONARY STENT  INTERVENTION;  Surgeon: Iran Ouch, MD;  Location: [facility] INVASIVE CV LAB;  Service: Cardiovascular;  Laterality: N/A;   CORONARY/GRAFT ACUTE MI REVASCULARIZATION N/A 09/23/2021   Procedure: Coronary/Graft Acute MI Revascularization;  Surgeon: Iran Ouch, MD;  Location: [facility] INVASIVE CV LAB;  Service: Cardiovascular;  Laterality: N/A;   LEFT HEART CATH AND CORONARY ANGIOGRAPHY N/A 07/06/2020   Procedure: LEFT HEART CATH AND CORONARY ANGIOGRAPHY;  Surgeon: Iran Ouch, MD;  Location: [facility] INVAS[facility] CV LAB;  Service: Cardiovascular;  Laterality: N/A;   LEFT HEART CATH AND CORONARY ANGIOGRAPHY N/A 09/23/2021   Procedure: LEFT HEART CATH AND CORONARY ANGIOGRAPHY;  Surgeon: Iran Ouch, MD;  Location: [facility] INVAS[facility] CV LAB;  Service: Cardiovascular;  Laterality: N/A;   LEFT HEART CATH AND CORS/GRAFTS ANGIOGRAPHY N/A 09/23/2021   Procedure: LEFT HEART CATH AND CORS/GRAFTS ANGIOGRAPHY;  Surgeon: Iran Ouch, MD;  Location: [facility]VASIVE CV LAB;  Service: Cardiovascular;  Laterality: N/A;   TEE WITHOUT CARDIOVERSION N/A 07/09/2020   Procedure: TRANSESOPHAGEAL ECHOCARDIOGRAM (TEE);  Surgeon: Delight Ovens, MD;  Location: [facility] OR;  Service: Open Heart Surgery;  Laterality: N/A;   WRIST SURGERY Left    for cyst     Current Outpatient Medications  Medication Sig Dispense Refill   albuterol (VENTOLIN HFA) 108 (90 Base) MCG/ACT inhaler Inhale 2 puffs into the lungs every 6 (six) hours as needed for wheezing or shortness of breath. 8 g 2   aspirin 81 MG chewable tablet Chew 1 tablet (81 mg total) by mouth daily.     calcium carbonate (TUMS - DOSED IN MG ELEMENTAL CALCIUM) 500 MG chewable tablet Chew 1 tablet (200 mg of elemental calcium total) by mouth 2 (two) times daily as needed for heartburn.     cetirizine (ZYRTEC ALLERGY) 10 MG tablet Take 1 tablet (10 mg total) by mouth daily. 30 tablet 2   clopidogrel (PLAVIX) 75 MG tablet Take 1 tablet (75 mg total) by mouth daily. 30 tablet 2    Evolocumab (REPATHA SURECLICK) 140 MG/ML SOAJ Inject 140 mg into the skin every 14 (fourteen) days. 2 mL 0   fluticasone (FLONASE) 50 MCG/ACT nasal spray Place 2 sprays into both nostrils daily. 9.9 mL 2   isosorbide mononitrate (IMDUR) 60 MG 24 hr tablet Take 1 tablet (60 mg total) by mouth 2 (two) times daily. 90 tablet 3   meclizine (ANTIVERT) 25 MG tablet Take 1 tablet (25 mg total) by mouth 3 (three) times daily as needed for dizziness. 30 tablet 0   metoprolol tartrate (LOPRESSOR) 50 MG tablet Take 1 tablet (50 mg total) by mouth 2 (two) times daily. 60 tablet 2   nitroGLYCERIN (NITROSTAT) 0.4 MG SL tablet Place 1 tablet (0.4 mg total) under the tongue every 5 (five) minutes as needed for chest pain. 25 tablet 3   pantoprazole (PROTONIX) 40 MG tablet Take 1 tablet (40 mg total) by mouth once daily. 30 tablet 2   rosuvastatin (CRESTOR) 40 MG tablet Take 1 tablet (40 mg total) by  mouth daily. 30 tablet 2   No current facility-administered medications for this visit.    Allergies:   Patient has no known allergies.    Social History:  The patient  reports that he has been smoking cigarettes. He has never used smokeless tobacco. He reports that he does not currently use alcohol. He reports that he does not currently use drugs.   Family History:  The patient's family history is not on file.    ROS:  Please see the history of present illness.   Otherwise, review of systems are positive for .   All other systems are reviewed and negative.    PHYSICAL EXAM: VS:  There were no vitals taken for this visit. , BMI There is no height or weight on file to calculate BMI. GEN: Well nourished, well developed, in no acute distress  HEENT: normal  Neck: no JVD, carotid bruits, or masses Cardiac: RRR; no murmurs, rubs, or gallops,no edema  Respiratory:  clear to auscultation bilaterally, normal work of breathing GI: soft, nontender, nondistended, + BS MS: no deformity or atrophy  Skin: warm and  dry, no rash Neuro:  Strength and sensation are intact Psych: euthymic mood, full affect    EKG:  EKG is ordered today. EKG showed normal sinus rhythm with no significant ST or T wave changes.   Recent Labs: 06/17/2022: ALT 18 08/18/2022: BUN 11; Creatinine, Ser 0.96; Hemoglobin 14.0; Platelets 243; Potassium 3.7; Sodium 138    Lipid Panel    Component Value Date/Time   CHOL 99 12/29/2021 0551   TRIG 95 12/29/2021 0551   HDL 34 (L) 12/29/2021 0551   CHOLHDL 2.9 12/29/2021 0551   VLDL 19 12/29/2021 0551   LDLCALC 46 12/29/2021 0551      Wt Readings from Last 3 Encounters:  11/21/22 199 lb (90.3 kg)  11/14/22 204 lb 5.9 oz (92.7 kg)  09/16/22 204 lb 6.4 oz (92.7 kg)           No data to display            ASSESSMENT AND PLAN:  1.  Coronary artery disease involving native coronary arteries with other forms of angina: Some of his symptoms are clearly musculoskeletal.  However, the exertional chest tightness is concerning especially with his known extensive cardiac history and multiple overlapped stents in the LIMA to LAD.  Fortunately, his EKG does not show ischemic changes.  I requested a Lexiscan Myoview.  I added Imdur 30 mg once daily.    2.  Essential hypertension: His blood pressure is elevated.  I added Imdur.  3.  Hyperlipidemia: Continue high-dose rosuvastatin.  Most recent lipid profile showed an LDL of 46.  4.  Left nipple tenderness: No clear or mass by exam.  Symptoms could be musculoskeletal.  If no improvement after 1 month, recommend an ultrasound.   Disposition: Schedule Lexiscan Myoview and follow-up in 1 month.  Signed,  Lorine Bears, MD  12/30/2022 1:51 PM    [facility]

## 2022-12-31 ENCOUNTER — Encounter: Payer: Self-pay | Admitting: Cardiovascular Disease

## 2022-12-31 NOTE — Progress Notes (Signed)
Total cholesterol and LDL are with goal.  HDL remains low.  Triglycerides have taken a huge jump.  Continue to make dietary changes and decreased fast, fat, fried foods as well as breads, pastries, pauses and sweets such as ice cream.

## 2023-01-08 ENCOUNTER — Ambulatory Visit: Payer: Self-pay | Attending: Cardiovascular Disease | Admitting: Cardiovascular Disease

## 2023-01-08 ENCOUNTER — Encounter: Payer: Self-pay | Admitting: Cardiovascular Disease

## 2023-01-08 VITALS — BP 120/80 | HR 89 | Ht 63.0 in | Wt 192.5 lb

## 2023-01-08 DIAGNOSIS — Z720 Tobacco use: Secondary | ICD-10-CM

## 2023-01-08 DIAGNOSIS — E785 Hyperlipidemia, unspecified: Secondary | ICD-10-CM

## 2023-01-08 DIAGNOSIS — I25118 Atherosclerotic heart disease of native coronary artery with other forms of angina pectoris: Secondary | ICD-10-CM

## 2023-01-08 DIAGNOSIS — I10 Essential (primary) hypertension: Secondary | ICD-10-CM

## 2023-01-08 NOTE — Patient Instructions (Signed)
Medication Instructions:  No changes *If you need a refill on your cardiac medications before your next appointment, please call your pharmacy*   Lab Work: None ordered If you have labs (blood work) drawn today and your tests are completely normal, you will receive your results only by: MyChart Message (if you have MyChart) OR A paper copy in the mail If you have any lab test that is abnormal or we need to change your treatment, we will call you to review the results.   Testing/Procedures: None ordered   Follow-Up: At [facility], you and your health needs are our priority.  As part of our continuing mission to provide you with exceptional heart care, we have created designated Provider Care Teams.  These Care Teams include your primary Cardiologist (physician) and Advanced Practice Providers (APPs -  Physician Assistants and Nurse Practitioners) who all work together to provide you with the care you need, when you need it.  We recommend signing up for the patient portal called "MyChart".  Sign up information is provided on this After Visit Summary.  MyChart is used to connect with patients for Virtual Visits (Telemedicine).  Patients are able to view lab/test results, encounter notes, upcoming appointments, etc.  Non-urgent messages can be sent to your provider as well.   To learn more about what you can do with MyChart, go to [email or web address].    Your next appointment:   6 month(s)  Provider:   You may see Muhammad Arida, MD or one of the following Advanced Practice Providers on your designated Care Team:   Christopher Berge, NP Ryan Dunn, PA-C Cadence Furth, PA-C Sheri Hammock, NP    

## 2023-01-08 NOTE — Progress Notes (Signed)
Cardiology Office Note   Date:  01/08/2023   ID:  Jeffery Sweeney, DOB [birth year], MRN [id number]  PCP:  Pcp, No  Cardiologist:   Lorine Bears, MD   Chief Complaint  Patient presents with   Follow-up    Patient c/o shortness of breath & chest tightness at times. Medications reviewed by the patient verbally.       History of Present Illness: Jeffery Sweeney is a 62 y.o. male who presents for a follow-up visit regarding coronary artery disease.  He has known history of essential hypertension, tobacco use and hyperlipidemia. The patient had previous PCI in [place] in 2019.  He presented in February of 2022  with unstable angina.  Cardiac catheterization showed significant three-vessel coronary artery disease.  Echo showed normal LV systolic function.  The patient underwent CABG x3 with LIMA to LAD, SVG to right PDA and SVG to ramus.  He was seen in May of 2023 for recurrent anginal symptoms.  He underwent cardiac catheterization which showed severe underlying three-vessel coronary artery disease with patent grafts including LIMA to LAD, SVG to diagonal and SVG to right PDA.  The left circumflex had significant obstructive disease and was not bypassed due to small vessel size.  His LIMA to LAD had an 80% ostial stenosis with hazy appearance.  When the LIMA was engaged, there was acute closure of the graft.  I performed emergent aspiration thrombectomy and drug-eluting stent placement.  He was placed on Aggrastat but had recurrent chest pain and EKG changes after about 6 hours.  Repeat emergent cardiac cath showed acute closure of LIMA to LAD beyond the previously stented segment due to what seemed to be spiral dissection throughout the whole graft to that the stenosis.  This was treated successfully with aspiration thrombectomy and 7 overlapped drug-eluting stents to cover the whole length of the LIMA into the native LAD just beyond the anastomosis.    He had recurrent  atypical chest pain since then.  Lexiscan Myoview in November 2023 showed no evidence of ischemia with normal ejection fraction.  He reports exertional fatigue and some shortness of breath but no chest pain.  He continues to smoke few cigarettes a day.  He has been dealing with recent issues of tinnitus and vertigo but symptoms improved.  Past Medical History:  Diagnosis Date   CVA (cerebral vascular accident) (HCC) 06/2021   Small acute/subacute infarcts in the left occipital lobe and   Hyperlipidemia LDL goal <70    started on Repatha 09/26/2021, continue Crestor 40mg  daily   Hypertension    Multiple vessel coronary artery disease    Myocardial infarct (HCC)    x 3; PCI with single stent placement for each (in [place])   NSTEMI (non-ST elevated myocardial infarction) (HCC)    LHC 2/18 with multivessel CAD and transfer to [facility] for CABG eval   STEMI (ST elevation myocardial infarction) (HCC) 09/23/2021   LHC 09/23/2021 x 2   Tobacco use    2 cigarettes daily   Unstable angina (HCC)    LHC 09/23/2021 x 2; total 8 DES to LIMA    Past Surgical History:  Procedure Laterality Date   CORONARY ANGIOPLASTY WITH STENT PLACEMENT     in [place] in 2018-19   CORONARY ARTERY BYPASS GRAFT N/A 07/09/2020   Procedure: CORONARY ARTERY BYPASS GRAFTING (CABG) x THREE , USING LEFT INTERNAL MAMMARY ARTERY, AND RIGHT LEG GREATER SAPHENOUS VEIN HARVESTED ENDOSCOPICALLY;  Surgeon: Delight Ovens, MD;  Location: [facility]  OR;  Service: Open Heart Surgery;  Laterality: N/A;   CORONARY PRESSURE/FFR STUDY N/A 07/06/2020   Procedure: INTRAVASCULAR PRESSURE WIRE/FFR STUDY;  Surgeon: Iran Ouch, MD;  Location: [facility] INVASIVE CV LAB;  Service: Cardiovascular;  Laterality: N/A;  LAD   CORONARY STENT INTERVENTION N/A 09/23/2021   Procedure: CORONARY STENT INTERVENTION;  Surgeon: Iran Ouch, MD;  Location: [facility] INVASIVE CV LAB;  Service: Cardiovascular;  Laterality: N/A;   CORONARY/GRAFT ACUTE MI  REVASCULARIZATION N/A 09/23/2021   Procedure: Coronary/Graft Acute MI Revascularization;  Surgeon: Iran Ouch, MD;  Location: [facility] INVASIVE CV LAB;  Service: Cardiovascular;  Laterality: N/A;   LEFT HEART CATH AND CORONARY ANGIOGRAPHY N/A 07/06/2020   Procedure: LEFT HEART CATH AND CORONARY ANGIOGRAPHY;  Surgeon: Iran Ouch, MD;  Location: [facility] INVASIVE CV LAB;  Service: Cardiovascular;  Laterality: N/A;   LEFT HEART CATH AND CORONARY ANGIOGRAPHY N/A 09/23/2021   Procedure: LEFT HEART CATH AND CORONARY ANGIOGRAPHY;  Surgeon: Iran Ouch, MD;  Location: [facility] INVASIVE CV LAB;  Service: Cardiovascular;  Laterality: N/A;   LEFT HEART CATH AND CORS/GRAFTS ANGIOGRAPHY N/A 09/23/2021   Procedure: LEFT HEART CATH AND CORS/GRAFTS ANGIOGRAPHY;  Surgeon: Iran Ouch, MD;  Location: [facility] INVASIVE CV LAB;  Service: Cardiovascular;  Laterality: N/A;   TEE WITHOUT CARDIOVERSION N/A 07/09/2020   Procedure: TRANSESOPHAGEAL ECHOCARDIOGRAM (TEE);  Surgeon: Delight Ovens, MD;  Location: [facility] OR;  Service: Open Heart Surgery;  Laterality: N/A;   WRIST SURGERY Left    for cyst     Current Outpatient Medications  Medication Sig Dispense Refill   albuterol (VENTOLIN HFA) 108 (90 Base) MCG/ACT inhaler Inhale 2 puffs into the lungs every 6 (six) hours as needed for wheezing or shortness of breath. 8 g 2   aspirin 81 MG chewable tablet Chew 1 tablet (81 mg total) by mouth daily.     calcium carbonate (TUMS - DOSED IN MG ELEMENTAL CALCIUM) 500 MG chewable tablet Chew 1 tablet (200 mg of elemental calcium total) by mouth 2 (two) times daily as needed for heartburn.     cetirizine (ZYRTEC ALLERGY) 10 MG tablet Take 1 tablet (10 mg total) by mouth daily. 30 tablet 2   clopidogrel (PLAVIX) 75 MG tablet Take 1 tablet (75 mg total) by mouth daily. 30 tablet 2   fluticasone (FLONASE) 50 MCG/ACT nasal spray Place 2 sprays into both nostrils daily. 9.9 mL 2   isosorbide mononitrate (IMDUR) 60 MG 24 hr tablet  Take 1 tablet (60 mg total) by mouth 2 (two) times daily. 90 tablet 3   meclizine (ANTIVERT) 25 MG tablet Take 1 tablet (25 mg total) by mouth 3 (three) times daily as needed for dizziness. 30 tablet 0   metoprolol tartrate (LOPRESSOR) 50 MG tablet Take 1 tablet (50 mg total) by mouth 2 (two) times daily. 60 tablet 2   nitroGLYCERIN (NITROSTAT) 0.4 MG SL tablet Place 1 tablet (0.4 mg total) under the tongue every 5 (five) minutes as needed for chest pain. 25 tablet 3   pantoprazole (PROTONIX) 40 MG tablet Take 1 tablet (40 mg total) by mouth once daily. 30 tablet 2   rosuvastatin (CRESTOR) 40 MG tablet Take 1 tablet (40 mg total) by mouth daily. 30 tablet 2   Evolocumab (REPATHA SURECLICK) 140 MG/ML SOAJ Inject 140 mg into the skin every 14 (fourteen) days. (Patient not taking: Reported on 01/08/2023) 2 mL 0   No current facility-administered medications for this visit.    Allergies:   Patient  has no known allergies.    Social History:  The patient  reports that he has been smoking cigarettes. He has never used smokeless tobacco. He reports that he does not currently use alcohol. He reports that he does not currently use drugs.   Family History:  The patient's family history is not on file.    ROS:  Please see the history of present illness.   Otherwise, review of systems are positive for .   All other systems are reviewed and negative.    PHYSICAL EXAM: VS:  BP 120/80 (BP Location: Left Arm, Patient Position: Sitting, Cuff Size: Normal)   Pulse 89   Ht 5\' 3"  (1.6 m)   Wt 192 lb 8 oz (87.3 kg)   SpO2 98%   BMI 34.10 kg/m  , BMI Body mass index is 34.1 kg/m. GEN: Well nourished, well developed, in no acute distress  HEENT: normal  Neck: no JVD, carotid bruits, or masses Cardiac: RRR; no murmurs, rubs, or gallops,no edema  Respiratory:  clear to auscultation bilaterally, normal work of breathing GI: soft, nontender, nondistended, + BS MS: no deformity or atrophy  Skin: warm and  dry, no rash Neuro:  Strength and sensation are intact Psych: euthymic mood, full affect    EKG:  EKG is ordered today. EKG showed : Normal sinus rhythm ST & T wave abnormality, consider anterior ischemia When compared with ECG of 18-Aug-2022 16:26, T wave inversion more evident in Anterior leads    Recent Labs: 06/17/2022: ALT 18 08/18/2022: BUN 11; Creatinine, Ser 0.96; Hemoglobin 14.0; Platelets 243; Potassium 3.7; Sodium 138    Lipid Panel    Component Value Date/Time   CHOL 121 12/30/2022 1717   TRIG 376 (H) 12/30/2022 1717   HDL 28 (L) 12/30/2022 1717   CHOLHDL 4.3 12/30/2022 1717   VLDL 75 (H) 12/30/2022 1717   LDLCALC 18 12/30/2022 1717      Wt Readings from Last 3 Encounters:  01/08/23 192 lb 8 oz (87.3 kg)  11/21/22 199 lb (90.3 kg)  11/14/22 204 lb 5.9 oz (92.7 kg)           No data to display            ASSESSMENT AND PLAN:  1.  Coronary artery disease involving native coronary arteries with other forms of angina: No recent chest pain but does complain of exertional fatigue and shortness of breath which is likely multifactorial.  His angina in the past manifested by chest discomfort.  His EKG shows more anterior T wave changes but overall his symptoms seem to be stable and I favor continued medical therapy.  Continue antianginal medications including Imdur and metoprolol.  Continue lifelong dual antiplatelet therapy as tolerated.  2.  Essential hypertension: Blood sugar is well-controlled on current medications.  3.  Hyperlipidemia: He is no longer on Repatha due to cost but continues to take rosuvastatin 40 mg once daily.  Recent lipid profile showed an LDL of 75 which is close to target.  4.  Tobacco use: I discussed with him the importance of complete smoking cessation.   Disposition: Follow-up in 6 months.  Signed,  Lorine Bears, MD  01/08/2023 8:15 AM    [facility]

## 2023-01-16 ENCOUNTER — Telehealth: Payer: Self-pay | Admitting: Cardiovascular Disease

## 2023-01-16 MED ORDER — CLOPIDOGREL BISULFATE 75 MG PO TABS: 75.0000 mg | ORAL_TABLET | Freq: Every day | ORAL | 6 refills | Status: DC

## 2023-01-16 MED ORDER — METOPROLOL TARTRATE 50 MG PO TABS: 50.0000 mg | ORAL_TABLET | Freq: Two times a day (BID) | ORAL | 6 refills | Status: DC

## 2023-01-16 NOTE — Telephone Encounter (Signed)
*  STAT* If patient is at the pharmacy, call can be transferred to refill team.   1. Which medications need to be refilled? (please list name of each medication and dose if known) pantoprazole (PROTONIX) 40 MG tablet   clopidogrel (PLAVIX) 75 MG tablet    metoprolol tartrate (LOPRESSOR) 50 MG tablet   2. Which pharmacy/location (including street and city if local pharmacy) is medication to be sent to? [facility] - [place] ([place]), [place] - [address]    3. Do they need a 30 day or 90 day supply? 30 day

## 2023-01-16 NOTE — Telephone Encounter (Signed)
Requested Prescriptions   Signed Prescriptions Disp Refills   clopidogrel (PLAVIX) 75 MG tablet 30 tablet 6    Sig: Take 1 tablet (75 mg total) by mouth daily.    Authorizing Provider: Lorine Bears A    Ordering User: Iverson Alamin C   metoprolol tartrate (LOPRESSOR) 50 MG tablet 60 tablet 6    Sig: Take 1 tablet (50 mg total) by mouth 2 (two) times daily.    Authorizing Provider: Lorine Bears A    Ordering User: Kendrick Fries

## 2023-01-17 ENCOUNTER — Other Ambulatory Visit: Payer: Self-pay | Admitting: Cardiovascular Disease

## 2023-02-09 ENCOUNTER — Other Ambulatory Visit: Payer: Self-pay | Admitting: Cardiovascular Disease

## 2023-03-19 ENCOUNTER — Telehealth: Payer: Self-pay | Admitting: Cardiovascular Disease

## 2023-03-19 ENCOUNTER — Other Ambulatory Visit: Payer: Self-pay

## 2023-03-19 MED ORDER — ISOSORBIDE MONONITRATE ER 60 MG PO TB24: 60.0000 mg | ORAL_TABLET | Freq: Two times a day (BID) | ORAL | 0 refills | Status: DC

## 2023-03-19 NOTE — Telephone Encounter (Signed)
Disp Refills Start End   isosorbide mononitrate (IMDUR) 60 MG 24 hr tablet 180 tablet 0 03/19/2023 06/17/2023   Sig - Route: Take 1 tablet (60 mg total) by mouth 2 (two) times daily. - Oral   Sent to pharmacy as: isosorbide mononitrate (IMDUR) 60 MG 24 hr tablet   E-Prescribing Status: Receipt confirmed by pharmacy (03/19/2023  1:18 PM EDT)    Pharmacy  [id number] - [place] ([place]), [place] - [address]

## 2023-03-19 NOTE — Telephone Encounter (Signed)
*  STAT* If patient is at the pharmacy, call can be transferred to refill team.   1. Which medications need to be refilled? (please list name of each medication and dose if known) isosorbide mononitrate (IMDUR) 60 MG 24 hr tablet    2. Would you like to learn more about the convenience, safety, & potential cost savings by using the [facility]?   3. Are you open to using the [facility] (Type [facility].  ).   4. Which pharmacy/location (including street and city if local pharmacy) is medication to be sent to? [facility] - [place] ([place]), [place] - [address]    5. Do they need a 30 day or 90 day supply? 90 day  Patient is out of medication

## 2023-04-20 ENCOUNTER — Telehealth: Payer: Self-pay | Admitting: Cardiovascular Disease

## 2023-04-20 MED ORDER — CLOPIDOGREL BISULFATE 75 MG PO TABS
75.0000 mg | ORAL_TABLET | Freq: Every day | ORAL | 0 refills | Status: DC
  Filled 2023-07-16: qty 30, 30d supply, fill #0

## 2023-04-20 MED ORDER — METOPROLOL TARTRATE 50 MG PO TABS
50.0000 mg | ORAL_TABLET | Freq: Two times a day (BID) | ORAL | 3 refills | Status: DC
  Filled 2023-07-16: qty 60, 30d supply, fill #0

## 2023-04-20 NOTE — Telephone Encounter (Signed)
*  STAT* If patient is at the pharmacy, call can be transferred to refill team.   1. Which medications need to be refilled? (please list name of each medication and dose if known)  clopidogrel (PLAVIX) 75 MG tablet    metoprolol tartrate (LOPRESSOR) 50 MG tablet  2. Which pharmacy/location (including street and city if local pharmacy) is medication to be sent to? [facility] - [place] ([place]), [place] - [address]   3. Do they need a 30 day or 90 day supply?   30 day supply

## 2023-04-21 ENCOUNTER — Telehealth: Payer: Self-pay | Admitting: Cardiovascular Disease

## 2023-04-21 NOTE — Telephone Encounter (Signed)
*  STAT* If patient is at the pharmacy, call can be transferred to refill team.   1. Which medications need to be refilled? (please list name of each medication and dose if known)   Place 1 tablet (0.4 mg total) under the tongue every 5 (five) minutes as needed for chest pain.    pantoprazole (PROTONIX) 40 MG tablet   2. Which pharmacy/location (including street and city if local pharmacy) is medication to be sent to? [facility] [address] ([place]), [place] - [address] Phone: [phone]  Fax: [phone]     3. Do they need a 30 day or 90 day supply? 90

## 2023-04-27 MED ORDER — PANTOPRAZOLE SODIUM 40 MG PO TBEC: 40.0000 mg | DELAYED_RELEASE_TABLET | Freq: Every day | ORAL | 0 refills | Status: DC

## 2023-04-27 NOTE — Telephone Encounter (Addendum)
Requested Prescriptions   Signed Prescriptions Disp Refills   pantoprazole (PROTONIX) 40 MG tablet 90 tablet 0    Sig: Take 1 tablet (40 mg total) by mouth daily. PLEASE CALL OFFICE TO SCHEDULE APPOINTMENT PRIOR TO NEXT REFILL    Authorizing Provider: Lorine Bears A    Ordering User: Feliberto Harts L   Confirmed with patient that he is needing Protonix refilled.  The initial request had the directions for NTG.  last visit: 01/08/23 with plan to Follow-up in 6 months. Next visit: active recall

## 2023-04-27 NOTE — Telephone Encounter (Signed)
Patient called to follow-up on the refill for his pantoprazole (PROTONIX) 40 MG tablet being sent to [facility] at [address]. Patient stated he is completely out of this medication.

## 2023-05-14 ENCOUNTER — Emergency Department: Payer: Self-pay

## 2023-05-14 ENCOUNTER — Encounter: Payer: Self-pay | Admitting: Emergency Medicine

## 2023-05-14 ENCOUNTER — Other Ambulatory Visit: Payer: Self-pay

## 2023-05-14 DIAGNOSIS — Z951 Presence of aortocoronary bypass graft: Secondary | ICD-10-CM | POA: Insufficient documentation

## 2023-05-14 DIAGNOSIS — I2510 Atherosclerotic heart disease of native coronary artery without angina pectoris: Secondary | ICD-10-CM | POA: Insufficient documentation

## 2023-05-14 DIAGNOSIS — K219 Gastro-esophageal reflux disease without esophagitis: Secondary | ICD-10-CM | POA: Insufficient documentation

## 2023-05-14 LAB — BASIC METABOLIC PANEL
Anion gap: 10 (ref 5–15)
BUN: 12 mg/dL (ref 8–23)
CO2: 26 mmol/L (ref 22–32)
Calcium: 9 mg/dL (ref 8.9–10.3)
Chloride: 99 mmol/L (ref 98–111)
Creatinine, Ser: 0.89 mg/dL (ref 0.61–1.24)
GFR, Estimated: >60 mL/min (ref >60)
Glucose, Bld: 310 mg/dL — ABNORMAL HIGH (ref 70–99)
Potassium: 3.5 mmol/L (ref 3.5–5.1)
Sodium: 135 mmol/L (ref 135–145)

## 2023-05-14 LAB — CBC
HCT: 48.5 % (ref 39.0–52.0)
Hemoglobin: 16.4 g/dL (ref 13.0–17.0)
MCH: 29.2 pg (ref 26.0–34.0)
MCHC: 33.8 g/dL (ref 30.0–36.0)
MCV: 86.3 fL (ref 80.0–100.0)
Platelets: 248 10*3/uL (ref 150–400)
RBC: 5.62 MIL/uL (ref 4.22–5.81)
RDW: 13 % (ref 11.5–15.5)
WBC: 13 10*3/uL — ABNORMAL HIGH (ref 4.0–10.5)
nRBC: 0 % (ref 0.0–0.2)

## 2023-05-14 LAB — TROPONIN I (HIGH SENSITIVITY): Troponin I (High Sensitivity): 4 ng/L (ref <18)

## 2023-05-14 NOTE — ED Triage Notes (Signed)
Pt arrives via POV ambulatory to triage c/o non-radiating centralized chest pain that started approx 1 hour ago. Sts he excreted himself when he went to pick up a bookshelf. CP ongoing since - associated with nausea, vomiting, and diaphoresis. Took NTG x2 without relief. Hx STEMI

## 2023-05-15 ENCOUNTER — Emergency Department
Admission: EM | Admit: 2023-05-15 | Discharge: 2023-05-15 | Disposition: A | Discharge status: Home / Self Care | Payer: Self-pay | Attending: Emergency Medicine | Admitting: Emergency Medicine

## 2023-05-15 DIAGNOSIS — Z951 Presence of aortocoronary bypass graft: Secondary | ICD-10-CM

## 2023-05-15 DIAGNOSIS — K219 Gastro-esophageal reflux disease without esophagitis: Secondary | ICD-10-CM

## 2023-05-15 DIAGNOSIS — R0789 Other chest pain: Secondary | ICD-10-CM

## 2023-05-15 LAB — TROPONIN I (HIGH SENSITIVITY): Troponin I (High Sensitivity): 3 ng/L (ref <18)

## 2023-05-15 MED ORDER — ALUM & MAG HYDROXIDE-SIMETH 200-200-20 MG/5ML PO SUSP
30.0000 mL | Freq: Once | ORAL | Status: AC
  Administered 2023-05-15: 30 mL via ORAL
  Filled 2023-05-15: qty 30

## 2023-05-15 MED ORDER — ACETAMINOPHEN 500 MG PO TABS
1000.0000 mg | ORAL_TABLET | Freq: Once | ORAL | Status: AC
  Administered 2023-05-15: 1000 mg via ORAL
  Filled 2023-05-15: qty 2

## 2023-05-15 NOTE — Discharge Instructions (Addendum)
Your evaluation is reassuring but you need to establish care with a primary care doctor and a cardiologist.  We put in referral so that people will reach out to you by phone to schedule appointments for you.  Please take advantage of this opportunity.  Continue using your regular medications.  You can use over-the-counter Tylenol as needed according to label instructions for ongoing pain.  Please avoid lifting anything else heavy.  Return to the emergency department if you develop new or worsening symptoms that concern you.

## 2023-05-15 NOTE — ED Provider Notes (Signed)
[facility] Provider Note    Event Date/Time   First MD Initiated Contact with Patient 05/15/23 0215     (approximate)   History   Chest Pain   HPI Jeffery Sweeney is a 62 y.o. male with a history of coronary artery disease status post CABG but who does not currently have a regular doctor or cardiologist according to his own report.  He presents for evaluation of chest pain.  He states that he was lifting a heavy cabinet and thinks he strained too hard.  It caused him to defecate on himself and then he started having some sharp pains in his chest.  He said it is better now and just a little bit sore.  He took nitroglycerin earlier but it did not make a difference.  He is not short of breath.  He also reports a burning sensation in his upper abdomen that he associates with acid reflux.  He wanted make sure everything was okay but he is feeling better than he did when he first arrived.     Physical Exam   Triage Vital Signs: ED Triage Vitals  Encounter Vitals Group     BP 05/14/23 2312 (!) 151/94     Systolic BP Percentile --      Diastolic BP Percentile --      Pulse Rate 05/14/23 2312 81     Resp 05/14/23 2312 (!) 22     Temp 05/14/23 2312 97.8 F (36.6 C)     Temp Source 05/14/23 2312 Oral     SpO2 05/14/23 2312 97 %     Weight 05/14/23 2313 87.1 kg (192 lb)     Height 05/14/23 2313 1.6 m (5\' 3" )     Head Circumference --      Peak Flow --      Pain Score 05/14/23 2313 7     Pain Loc --      Pain Education --      Exclude from Growth Chart --     Most recent vital signs: Vitals:   05/14/23 2312  BP: (!) 151/94  Pulse: 81  Resp: (!) 22  Temp: 97.8 F (36.6 C)  SpO2: 97%    General: Awake, no distress.  Laughing and joking with me, conversant without difficulty. CV:  Good peripheral perfusion.  Normal heart sounds, mild generalized tenderness to palpation of anterior chest wall.  Well-healed sternotomy scar. Resp:  Normal effort.  Speaking easily and comfortably, no accessory muscle usage nor intercostal retractions.  Clear to auscultation bilaterally. Abd:  No distention.  No tenderness to palpation of the abdomen.   ED Results / Procedures / Treatments   Labs (all labs ordered are listed, but only abnormal results are displayed) Labs Reviewed  BASIC METABOLIC PANEL - Abnormal; Notable for the following components:      Result Value   Glucose, Bld 310 (*)    All other components within normal limits  CBC - Abnormal; Notable for the following components:   WBC 13.0 (*)    All other components within normal limits  TROPONIN I (HIGH SENSITIVITY)  TROPONIN I (HIGH SENSITIVITY)     EKG  ED ECG REPORT I, Loleta Rose, the attending physician, personally viewed and interpreted this ECG.  Date: 05/14/2023 EKG Time: 23: 14 Rate: 80 Rhythm: normal sinus rhythm QRS Axis: normal Intervals: normal ST/T Wave abnormalities: normal Narrative Interpretation: no evidence of acute ischemia    RADIOLOGY I viewed and interpreted the  patient's two-view chest x-ray and I can see that he has had a CABG but I see no evidence of acute or emergent abnormality such as pneumonia nor pneumothorax.  I also read the radiologist's report, which confirmed no acute findings.   PROCEDURES:  Critical Care performed: No  Procedures    IMPRESSION / MDM / ASSESSMENT AND PLAN / ED COURSE  I reviewed the triage vital signs and the nursing notes.                              Differential diagnosis includes, but is not limited to, musculoskeletal strain, costochondritis, ACS, PE, pneumonia, pneumothorax, acid reflux.  Patient's presentation is most consistent with acute presentation with potential threat to life or bodily function.  Labs/studies ordered: BMP, CBC, high-sensitivity troponin x 2, two-view chest x-ray, EKG  Interventions/Medications given:  Medications  alum & mag hydroxide-simeth (MAALOX/MYLANTA) 200-200-20  MG/5ML suspension 30 mL (has no administration in time range)  acetaminophen (TYLENOL) tablet 1,000 mg (has no administration in time range)    (Note:  hospital course my include additional interventions and/or labs/studies not listed above.)   Patient well-appearing and in no distress.  Symptoms strongly consistent with musculoskeletal strain.  I obtained 2 high-sensitivity troponins and verified no ischemic changes on EKG given his cardiac history but he does not appear to have an emergent medical condition at this time.  I initially considered hospitalization for cardiac observation given his history, but that is unnecessary at this point.  Patient is comfortable with the plan for discharge.  I placed orders for outpatient referral to both primary care and cardiology.  I gave strict return precautions and he understands and agrees with the plan.         FINAL CLINICAL IMPRESSION(S) / ED DIAGNOSES   Final diagnoses:  Atypical chest pain  Gastroesophageal reflux disease, unspecified whether esophagitis present  Hx of CABG     Rx / DC Orders   ED Discharge Orders          Ordered    Ambulatory referral to Cardiology       Comments: If you have not heard from the Cardiology office within the next 72 hours please call [phone].   05/15/23 0310    Ambulatory Referral to Primary Care (Establish Care)        05/15/23 0310             Note:  This document was prepared using Dragon voice recognition software and may include unintentional dictation errors.   Loleta Rose, MD 05/15/23 [phone]

## 2023-06-08 ENCOUNTER — Emergency Department
Admission: EM | Admit: 2023-06-08 | Discharge: 2023-06-09 | Disposition: A | Discharge status: Home / Self Care | Payer: Self-pay | Attending: Emergency Medicine | Admitting: Emergency Medicine

## 2023-06-08 ENCOUNTER — Other Ambulatory Visit: Payer: Self-pay

## 2023-06-08 ENCOUNTER — Encounter: Payer: Self-pay | Admitting: Emergency Medicine

## 2023-06-08 DIAGNOSIS — K5730 Diverticulosis of large intestine without perforation or abscess without bleeding: Secondary | ICD-10-CM | POA: Insufficient documentation

## 2023-06-08 DIAGNOSIS — K5790 Diverticulosis of intestine, part unspecified, without perforation or abscess without bleeding: Secondary | ICD-10-CM

## 2023-06-08 DIAGNOSIS — I10 Essential (primary) hypertension: Secondary | ICD-10-CM | POA: Insufficient documentation

## 2023-06-08 DIAGNOSIS — R739 Hyperglycemia, unspecified: Secondary | ICD-10-CM | POA: Insufficient documentation

## 2023-06-08 DIAGNOSIS — Z7982 Long term (current) use of aspirin: Secondary | ICD-10-CM | POA: Insufficient documentation

## 2023-06-08 DIAGNOSIS — Z8673 Personal history of transient ischemic attack (TIA), and cerebral infarction without residual deficits: Secondary | ICD-10-CM | POA: Insufficient documentation

## 2023-06-08 DIAGNOSIS — Z955 Presence of coronary angioplasty implant and graft: Secondary | ICD-10-CM | POA: Insufficient documentation

## 2023-06-08 DIAGNOSIS — K8020 Calculus of gallbladder without cholecystitis without obstruction: Secondary | ICD-10-CM | POA: Insufficient documentation

## 2023-06-08 DIAGNOSIS — F17210 Nicotine dependence, cigarettes, uncomplicated: Secondary | ICD-10-CM | POA: Insufficient documentation

## 2023-06-08 DIAGNOSIS — I2510 Atherosclerotic heart disease of native coronary artery without angina pectoris: Secondary | ICD-10-CM | POA: Insufficient documentation

## 2023-06-08 DIAGNOSIS — Z79899 Other long term (current) drug therapy: Secondary | ICD-10-CM | POA: Insufficient documentation

## 2023-06-08 LAB — COMPREHENSIVE METABOLIC PANEL
ALT: 22 U/L (ref 0–44)
AST: 16 U/L (ref 15–41)
Albumin: 3.7 g/dL (ref 3.5–5.0)
Alkaline Phosphatase: 90 U/L (ref 38–126)
Anion gap: 7 (ref 5–15)
BUN: 14 mg/dL (ref 8–23)
CO2: 26 mmol/L (ref 22–32)
Calcium: 9.2 mg/dL (ref 8.9–10.3)
Chloride: 99 mmol/L (ref 98–111)
Creatinine, Ser: 0.88 mg/dL (ref 0.61–1.24)
GFR, Estimated: >60 mL/min (ref >60)
Glucose, Bld: 340 mg/dL — ABNORMAL HIGH (ref 70–99)
Potassium: 4.2 mmol/L (ref 3.5–5.1)
Sodium: 132 mmol/L — ABNORMAL LOW (ref 135–145)
Total Bilirubin: 1 mg/dL (ref 0.0–1.2)
Total Protein: 7.4 g/dL (ref 6.5–8.1)

## 2023-06-08 LAB — CBC WITH DIFFERENTIAL/PLATELET
Abs Immature Granulocytes: 0.01 10*3/uL (ref 0.00–0.07)
Basophils Absolute: 0 10*3/uL (ref 0.0–0.1)
Basophils Relative: 1 %
Eosinophils Absolute: 0.3 10*3/uL (ref 0.0–0.5)
Eosinophils Relative: 3 %
HCT: 46.8 % (ref 39.0–52.0)
Hemoglobin: 15.7 g/dL (ref 13.0–17.0)
Immature Granulocytes: 0 %
Lymphocytes Relative: 51 %
Lymphs Abs: 4.2 10*3/uL — ABNORMAL HIGH (ref 0.7–4.0)
MCH: 29 pg (ref 26.0–34.0)
MCHC: 33.5 g/dL (ref 30.0–36.0)
MCV: 86.5 fL (ref 80.0–100.0)
Monocytes Absolute: 0.5 10*3/uL (ref 0.1–1.0)
Monocytes Relative: 6 %
Neutro Abs: 3.2 10*3/uL (ref 1.7–7.7)
Neutrophils Relative %: 39 %
Platelets: 208 10*3/uL (ref 150–400)
RBC: 5.41 MIL/uL (ref 4.22–5.81)
RDW: 12.9 % (ref 11.5–15.5)
WBC: 8.1 10*3/uL (ref 4.0–10.5)
nRBC: 0 % (ref 0.0–0.2)

## 2023-06-08 LAB — URINALYSIS, ROUTINE W REFLEX MICROSCOPIC
Bacteria, UA: NONE SEEN
Bilirubin Urine: NEGATIVE
Glucose, UA: >=500 mg/dL — AB
Hgb urine dipstick: NEGATIVE
Ketones, ur: NEGATIVE mg/dL
Leukocytes,Ua: NEGATIVE
Nitrite: NEGATIVE
Protein, ur: NEGATIVE mg/dL
Specific Gravity, Urine: 1.033 — ABNORMAL HIGH (ref 1.005–1.030)
Squamous Epithelial / HPF: 0 /[HPF] (ref 0–5)
pH: 5 (ref 5.0–8.0)

## 2023-06-08 LAB — LIPASE, BLOOD: Lipase: 34 U/L (ref 11–51)

## 2023-06-08 MED ORDER — SODIUM CHLORIDE 0.9 % IV BOLUS (SEPSIS)
1000.0000 mL | Freq: Once | INTRAVENOUS | Status: AC
  Administered 2023-06-09: 1000 mL via INTRAVENOUS

## 2023-06-08 MED ORDER — KETOROLAC TROMETHAMINE 30 MG/ML IJ SOLN
30.0000 mg | Freq: Once | INTRAMUSCULAR | Status: AC
  Administered 2023-06-09: 30 mg via INTRAVENOUS
  Filled 2023-06-08: qty 1

## 2023-06-08 MED ORDER — METFORMIN HCL 500 MG PO TABS
500.0000 mg | ORAL_TABLET | Freq: Once | ORAL | Status: AC
  Administered 2023-06-09: 500 mg via ORAL
  Filled 2023-06-08: qty 1

## 2023-06-08 NOTE — ED Triage Notes (Signed)
Pt in via POV, reports left lower abdominal pain w/ nausea x 1 days.  Denies any emesis/diarrhea.  Reports hx of gastritis.  Ambulatory to triage, NAD noted at this time.

## 2023-06-08 NOTE — ED Provider Notes (Signed)
[facility] Provider Note    Event Date/Time   First MD Initiated Contact with Patient 06/08/23 2302     (approximate)   History   Abdominal Pain   HPI  Winslow Artiaga is a 63 y.o. male with history of hypertension, hyperlipidemia, CAD status post CABG, CVA who presents to the emergency department with complaints of generalized and left lower quadrant abdominal pain.  No nausea, vomiting or diarrhea.  No dysuria or hematuria but has had urinary frequency.  Denies any history of diabetes.  No previous abdominal surgery.   History provided by patient.    Past Medical History:  Diagnosis Date   CVA (cerebral vascular accident) (HCC) 06/2021   Small acute/subacute infarcts in the left occipital lobe and   Hyperlipidemia LDL goal <70    started on Repatha 09/26/2021, continue Crestor 40mg  daily   Hypertension    Multiple vessel coronary artery disease    Myocardial infarct (HCC)    x 3; PCI with single stent placement for each (in [place])   NSTEMI (non-ST elevated myocardial infarction) (HCC)    LHC 2/18 with multivessel CAD and transfer to [facility] for CABG eval   STEMI (ST elevation myocardial infarction) (HCC) 09/23/2021   LHC 09/23/2021 x 2   Tobacco use    2 cigarettes daily   Unstable angina (HCC)    LHC 09/23/2021 x 2; total 8 DES to LIMA    Past Surgical History:  Procedure Laterality Date   CORONARY ANGIOPLASTY WITH STENT PLACEMENT     in [place] in 2018-19   CORONARY ARTERY BYPASS GRAFT N/A 07/09/2020   Procedure: CORONARY ARTERY BYPASS GRAFTING (CABG) x THREE , USING LEFT INTERNAL MAMMARY ARTERY, AND RIGHT LEG GREATER SAPHENOUS VEIN HARVESTED ENDOSCOPICALLY;  Surgeon: Delight Ovens, MD;  Location: [facility] OR;  Service: Open Heart Surgery;  Laterality: N/A;   CORONARY PRESSURE/FFR STUDY N/A 07/06/2020   Procedure: INTRAVASCULAR PRESSURE WIRE/FFR STUDY;  Surgeon: Iran Ouch, MD;  Location: [facility] INVASIVE CV LAB;  Service:  Cardiovascular;  Laterality: N/A;  LAD   CORONARY STENT INTERVENTION N/A 09/23/2021   Procedure: CORONARY STENT INTERVENTION;  Surgeon: Iran Ouch, MD;  Location: [facility] INVASIVE CV LAB;  Service: Cardiovascular;  Laterality: N/A;   CORONARY/GRAFT ACUTE MI REVASCULARIZATION N/A 09/23/2021   Procedure: Coronary/Graft Acute MI Revascularization;  Surgeon: Iran Ouch, MD;  Location: [facility] INVASIVE CV LAB;  Service: Cardiovascular;  Laterality: N/A;   LEFT HEART CATH AND CORONARY ANGIOGRAPHY N/A 07/06/2020   Procedure: LEFT HEART CATH AND CORONARY ANGIOGRAPHY;  Surgeon: Iran Ouch, MD;  Location: [facility] INVASIVE CV LAB;  Service: Cardiovascular;  Laterality: N/A;   LEFT HEART CATH AND CORONARY ANGIOGRAPHY N/A 09/23/2021   Procedure: LEFT HEART CATH AND CORONARY ANGIOGRAPHY;  Surgeon: Iran Ouch, MD;  Location: [facility] INVASIVE CV LAB;  Service: Cardiovascular;  Laterality: N/A;   LEFT HEART CATH AND CORS/GRAFTS ANGIOGRAPHY N/A 09/23/2021   Procedure: LEFT HEART CATH AND CORS/GRAFTS ANGIOGRAPHY;  Surgeon: Iran Ouch, MD;  Location: [facility] INVASIVE CV LAB;  Service: Cardiovascular;  Laterality: N/A;   TEE WITHOUT CARDIOVERSION N/A 07/09/2020   Procedure: TRANSESOPHAGEAL ECHOCARDIOGRAM (TEE);  Surgeon: Delight Ovens, MD;  Location: [facility] OR;  Service: Open Heart Surgery;  Laterality: N/A;   WRIST SURGERY Left    for cyst    MEDICATIONS:  Prior to Admission medications   Medication Sig Start Date End Date Taking? Authorizing Provider  albuterol (VENTOLIN HFA) 108 (90 Base) MCG/ACT  inhaler Inhale 2 puffs into the lungs every 6 (six) hours as needed for wheezing or shortness of breath. 06/17/22   Varney Daily, PA  aspirin 81 MG chewable tablet Chew 1 tablet (81 mg total) by mouth daily. 09/27/21   Charlsie Quest, NP  calcium carbonate (TUMS - DOSED IN MG ELEMENTAL CALCIUM) 500 MG chewable tablet Chew 1 tablet (200 mg of elemental calcium total) by mouth 2 (two) times daily as  needed for heartburn. 01/01/22   Lynn Ito, MD  cetirizine (ZYRTEC ALLERGY) 10 MG tablet Take 1 tablet (10 mg total) by mouth daily. 11/14/22 11/14/23  Triplett, Rulon Eisenmenger B, FNP  clopidogrel (PLAVIX) 75 MG tablet Take 1 tablet (75 mg total) by mouth daily. 04/20/23   Iran Ouch, MD  Evolocumab (REPATHA SURECLICK) 140 MG/ML SOAJ Inject 140 mg into the skin every 14 (fourteen) days. Patient not taking: Reported on 01/08/2023 09/25/21   Debbe Odea, MD  fluticasone [facility]) 50 MCG/ACT nasal spray Place 2 sprays into both nostrils daily. 11/14/22 11/14/23  Triplett, Rulon Eisenmenger B, FNP  isosorbide mononitrate (IMDUR) 60 MG 24 hr tablet Take 1 tablet (60 mg total) by mouth 2 (two) times daily. 03/19/23 06/17/23  Debbe Odea, MD  meclizine (ANTIVERT) 25 MG tablet Take 1 tablet (25 mg total) by mouth 3 (three) times daily as needed for dizziness. 07/05/21   Minna Antis, MD  metoprolol tartrate (LOPRESSOR) 50 MG tablet Take 1 tablet (50 mg total) by mouth 2 (two) times daily. 04/20/23   Iran Ouch, MD  nitroGLYCERIN (NITROSTAT) 0.4 MG SL tablet Place 1 tablet (0.4 mg total) under the tongue every 5 (five) minutes as needed for chest pain. 09/26/21   Charlsie Quest, NP  pantoprazole (PROTONIX) 40 MG tablet Take 1 tablet (40 mg total) by mouth daily. PLEASE CALL OFFICE TO SCHEDULE APPOINTMENT PRIOR TO NEXT REFILL 04/27/23   Iran Ouch, MD  rosuvastatin (CRESTOR) 40 MG tablet Take 1 tablet by mouth once daily 02/09/23   Iran Ouch, MD    Physical Exam   Triage Vital Signs: ED Triage Vitals  Encounter Vitals Group     BP 06/08/23 1642 (!) 137/100     Systolic BP Percentile --      Diastolic BP Percentile --      Pulse Rate 06/08/23 1642 77     Resp 06/08/23 1642 17     Temp 06/08/23 1642 97.7 F (36.5 C)     Temp Source 06/08/23 1642 Oral     SpO2 06/08/23 1642 97 %     Weight 06/08/23 1641 180 lb (81.6 kg)     Height 06/08/23 1641 5\' 3"  (1.6 m)     Head Circumference --       Peak Flow --      Pain Score 06/08/23 1641 9     Pain Loc --      Pain Education --      Exclude from Growth Chart --     Most recent vital signs: Vitals:   06/08/23 1642 06/09/23 0050  BP: (!) 137/100 (!) 141/84  Pulse: 77 75  Resp: 17 18  Temp: 97.7 F (36.5 C) 98.1 F (36.7 C)  SpO2: 97% 98%    CONSTITUTIONAL: Alert, responds appropriately to questions. Well-appearing; well-nourished HEAD: Normocephalic, atraumatic EYES: Conjunctivae clear, pupils appear equal, sclera nonicteric ENT: normal nose; moist mucous membranes NECK: Supple, normal ROM CARD: RRR; S1 and S2 appreciated RESP: Normal chest excursion without splinting or tachypnea; breath sounds  clear and equal bilaterally; no wheezes, no rhonchi, no rales, no hypoxia or respiratory distress, speaking full sentences ABD/GI: Non-distended; soft, generalized tenderness without guarding or rebound BACK: The back appears normal EXT: Normal ROM in all joints; no deformity noted, no edema SKIN: Normal color for age and race; warm; no rash on exposed skin NEURO: Moves all extremities equally, normal speech PSYCH: The patient's mood and manner are appropriate.   ED Results / Procedures / Treatments   LABS: (all labs ordered are listed, but only abnormal results are displayed) Labs Reviewed  COMPREHENSIVE METABOLIC PANEL - Abnormal; Notable for the following components:      Result Value   Sodium 132 (*)    Glucose, Bld 340 (*)    All other components within normal limits  CBC WITH DIFFERENTIAL/PLATELET - Abnormal; Notable for the following components:   Lymphs Abs 4.2 (*)    All other components within normal limits  URINALYSIS, ROUTINE W REFLEX MICROSCOPIC - Abnormal; Notable for the following components:   Color, Urine YELLOW (*)    APPearance CLEAR (*)    Specific Gravity, Urine 1.033 (*)    Glucose, UA >=500 (*)    All other components within normal limits  CBG MONITORING, ED - Abnormal; Notable for the  following components:   Glucose-Capillary 334 (*)    All other components within normal limits  LIPASE, BLOOD     EKG:   RADIOLOGY: My personal review and interpretation of imaging: CT shows gallstones.  I have personally reviewed all radiology reports.   CT ABDOMEN PELVIS W CONTRAST Result Date: 06/09/2023 CLINICAL DATA:  Left lower quadrant pain EXAM: CT ABDOMEN AND PELVIS WITH CONTRAST TECHNIQUE: Multidetector CT imaging of the abdomen and pelvis was performed using the standard protocol following bolus administration of intravenous contrast. RADIATION DOSE REDUCTION: This exam was performed according to the departmental dose-optimization program which includes automated exposure control, adjustment of the mA and/or kV according to patient size and/or use of iterative reconstruction technique. CONTRAST:  OMNIPAQUE IOHEXOL 300 MG/ML  SOLN COMPARISON:  12/27/2021 FINDINGS: Lower chest: No acute abnormality Hepatobiliary: Layering gallstones within the gallbladder. Diffuse low-density throughout the liver compatible with fatty infiltration. No focal abnormality or biliary ductal dilatation. Pancreas: No focal abnormality or ductal dilatation. Spleen: No focal abnormality.  Normal size. Adrenals/Urinary Tract: Adrenal glands normal. Simple. 4 cm cyst in the upper pole of the left kidney, unchanged. No follow-up imaging recommended. No stones or hydronephrosis. Urinary bladder unremarkable. Stomach/Bowel: Colonic diverticulosis. No active diverticulitis. Stomach and small bowel decompressed. Vascular/Lymphatic: Aortic atherosclerosis. No evidence of aneurysm or adenopathy. Reproductive: No visible focal abnormality. Other: No free fluid or free air. Musculoskeletal: No acute bony abnormality. IMPRESSION: Cholelithiasis. Hepatic steatosis. Colonic diverticulosis. Aortic atherosclerosis. No acute findings. Electronically Signed   By: Charlett Nose M.D.   On: 06/09/2023 01:09      PROCEDURES:  Critical Care performed: No      Procedures    IMPRESSION / MDM / ASSESSMENT AND PLAN / ED COURSE  I reviewed the triage vital signs and the nursing notes.    Patient here with generalized abdominal pain, left lower quadrant pain.    DIFFERENTIAL DIAGNOSIS (includes but not limited to):   Diverticulitis, colitis, bowel obstruction, UTI, kidney stone, pyelonephritis, viral gastroenteritis   Patient's presentation is most consistent with acute presentation with potential threat to life or bodily function.   PLAN: Workup initiated from triage.  Patient is hyperglycemic without DKA.  Normal white blood cell  count, LFTs and lipase.  Urine shows glucose but no ketones.  He denies any history of diabetes.  Repeat blood sugar still in the 300s and states he last ate at noon yesterday.  Will start him on metformin and have him follow-up with his PCP for new onset diabetes.  Will obtain CT of the abdomen pelvis.  Will give pain medicine, IV fluids.   MEDICATIONS GIVEN IN ED: Medications  sodium chloride 0.9 % bolus 1,000 mL (0 mLs Intravenous Stopped 06/09/23 0048)  ketorolac (TORADOL) 30 MG/ML injection 30 mg (30 mg Intravenous Given 06/09/23 0006)  metFORMIN (GLUCOPHAGE) tablet 500 mg (500 mg Oral Given 06/09/23 0009)  iohexol (OMNIPAQUE) 300 MG/ML solution 100 mL (100 mLs Intravenous Contrast Given 06/09/23 0058)     ED COURSE: CT scan reviewed and interpreted by myself and the radiologist.  He has diverticulosis without diverticulitis or colitis.  Patient has gallstones without cholecystitis.  He has no right upper quadrant abdominal tenderness or upper abdominal pain on exam.  Discussed these findings with patient.  Also discussed the importance of low-carb diet and following up with a PCP to have his blood sugars rechecked as well as his hemoglobin A1c.  Will discharge on metformin.  It appears he has a cardiologist that follows him but does not have a local PCP.   Referral has been placed.  I feel he is safe for discharge.  Discussed return precautions.  Patient verbalized understanding.   At this time, I do not feel there is any life-threatening condition present. I reviewed all nursing notes, vitals, pertinent previous records.  All lab and urine results, EKGs, imaging ordered have been independently reviewed and interpreted by myself.  I reviewed all available radiology reports from any imaging ordered this visit.  Based on my assessment, I feel the patient is safe to be discharged home without further emergent workup and can continue workup as an outpatient as needed. Discussed all findings, treatment plan as well as usual and customary return precautions.  They verbalize understanding and are comfortable with this plan.  Outpatient follow-up has been provided as needed.  All questions have been answered.    CONSULTS:  none   OUTSIDE RECORDS REVIEWED: Reviewed last cardiology note in August 2024.       FINAL CLINICAL IMPRESSION(S) / ED DIAGNOSES   Final diagnoses:  Hyperglycemia  Gallstones  Diverticulosis     Rx / DC Orders   ED Discharge Orders          Ordered    Ambulatory Referral to Primary Care (Establish Care)        06/09/23 0209    metFORMIN (GLUCOPHAGE) 500 MG tablet  2 times daily with meals        06/09/23 0210             Note:  This document was prepared using Dragon voice recognition software and may include unintentional dictation errors.   Kadien Lineman, Layla Maw, DO 06/09/23 0222

## 2023-06-08 NOTE — ED Provider Triage Note (Signed)
Emergency Medicine Provider Triage Evaluation Note  Jeffery Sweeney , a 63 y.o. male  was evaluated in triage.  Pt complains of "upset stomach that feels like its on fire." Also reports LLQ stabbing pain. This all began today. +n/v. No diarrhea. H/o gastritis.  Review of Systems  Positive: Abd pain, n/v Negative: Fever, dysuria  Physical Exam  There were no vitals taken for this visit. Gen:   Awake, no distress   Resp:  Normal effort  MSK:   Moves extremities without difficulty  Other:    Medical Decision Making  Medically screening exam initiated at 4:39 PM.  Appropriate orders placed.  [name] was informed that the remainder of the evaluation will be completed by another provider, this initial triage assessment does not replace that evaluation, and the importance of remaining in the ED until their evaluation is complete.     Jackelyn Hoehn, PA-C 06/08/23 [phone]

## 2023-06-09 ENCOUNTER — Emergency Department: Payer: Self-pay

## 2023-06-09 LAB — CBG MONITORING, ED: Glucose-Capillary: 334 mg/dL — ABNORMAL HIGH (ref 70–99)

## 2023-06-09 MED ORDER — METFORMIN HCL 500 MG PO TABS: 500.0000 mg | ORAL_TABLET | Freq: Two times a day (BID) | ORAL | 1 refills | Status: DC

## 2023-06-09 MED ORDER — IOHEXOL 300 MG/ML  SOLN
100.0000 mL | Freq: Once | INTRAMUSCULAR | Status: AC | PRN
  Administered 2023-06-09: 100 mL via INTRAVENOUS

## 2023-06-09 NOTE — Discharge Instructions (Addendum)
You may take over-the-counter Tylenol 1000 mg every 6 hours for pain.  I recommend a bland diet for the next several days.  Please return the emergency department if you have worsening abdominal pain, fever, vomiting that does not stop, blood in your stool or black and tarry stools, chest pain or shortness of breath.  Your CT scan did show gallstones but this usually causes right upper abdominal pain.  I recommend a low-fat diet for this.  You were also found to have diverticulosis but no signs of inflammation called diverticulitis.  Your blood glucose was also elevated and we are starting you on metformin for this.  I have also placed referral for primary care doctor to manage your blood sugars.

## 2023-06-15 ENCOUNTER — Telehealth: Payer: Self-pay | Admitting: Cardiovascular Disease

## 2023-06-15 ENCOUNTER — Ambulatory Visit: Payer: Self-pay

## 2023-06-15 NOTE — Telephone Encounter (Signed)
Patient stated he was released from the hospital and wants advice on his medication.

## 2023-06-15 NOTE — Telephone Encounter (Addendum)
Patient stated he was recently in the hospital and he wants to schedule a follow up visit. He wants further information on blood sugars and whether he is diabetic and has gallstones. He was started on new medication (Metformin) and wants to know if this will interfere with any other medications he is taking. Patient denies any new symptoms since leaving the hospital. New patient appointment scheduled for tomorrow.

## 2023-06-15 NOTE — Telephone Encounter (Signed)
Left a message for the patient to call back.    Alvstad, Kristin L, RPH-CPP  You19 minutes ago (10:06 AM)    He'll be fine to take the metformin.  Jeffery Sweeney

## 2023-06-15 NOTE — Telephone Encounter (Signed)
The patient called stating that he was recently seen in the ER and prescribed metformin for elevated blood sugar. He also mentioned being diagnosed with gallstones and is calling for further recommendations.  According to discharge instructions, the patient was advised to follow up with his PCP; however, he noted that he does not have a PCP. The nurse provided the patient with the [facility]CP office phone number to help him establish care. In the meantime, the patient wanted to confirm whether metformin would interact with his cardiac medication. This will be forwarded to the Pharm D for review.

## 2023-06-16 ENCOUNTER — Encounter: Payer: Self-pay | Admitting: Cardiovascular Disease

## 2023-06-16 ENCOUNTER — Ambulatory Visit: Payer: Self-pay | Attending: Cardiovascular Disease | Admitting: Cardiovascular Disease

## 2023-06-16 ENCOUNTER — Ambulatory Visit (INDEPENDENT_AMBULATORY_CARE_PROVIDER_SITE_OTHER): Payer: Self-pay | Admitting: Family Medicine

## 2023-06-16 ENCOUNTER — Encounter: Payer: Self-pay | Admitting: Family Medicine

## 2023-06-16 VITALS — BP 144/90 | HR 96 | Temp 97.5°F | Resp 18 | Ht 63.0 in | Wt 180.0 lb

## 2023-06-16 VITALS — BP 132/86 | HR 92 | Ht 63.0 in | Wt 181.4 lb

## 2023-06-16 DIAGNOSIS — E785 Hyperlipidemia, unspecified: Secondary | ICD-10-CM

## 2023-06-16 DIAGNOSIS — K8020 Calculus of gallbladder without cholecystitis without obstruction: Secondary | ICD-10-CM

## 2023-06-16 DIAGNOSIS — I25118 Atherosclerotic heart disease of native coronary artery with other forms of angina pectoris: Secondary | ICD-10-CM

## 2023-06-16 DIAGNOSIS — I10 Essential (primary) hypertension: Secondary | ICD-10-CM

## 2023-06-16 DIAGNOSIS — Z951 Presence of aortocoronary bypass graft: Secondary | ICD-10-CM

## 2023-06-16 DIAGNOSIS — Z7689 Persons encountering health services in other specified circumstances: Secondary | ICD-10-CM

## 2023-06-16 DIAGNOSIS — R739 Hyperglycemia, unspecified: Secondary | ICD-10-CM

## 2023-06-16 DIAGNOSIS — Z23 Encounter for immunization: Secondary | ICD-10-CM

## 2023-06-16 DIAGNOSIS — I25708 Atherosclerosis of coronary artery bypass graft(s), unspecified, with other forms of angina pectoris: Secondary | ICD-10-CM

## 2023-06-16 DIAGNOSIS — R072 Precordial pain: Secondary | ICD-10-CM

## 2023-06-16 DIAGNOSIS — Z955 Presence of coronary angioplasty implant and graft: Secondary | ICD-10-CM

## 2023-06-16 DIAGNOSIS — Z720 Tobacco use: Secondary | ICD-10-CM

## 2023-06-16 DIAGNOSIS — I700 Atherosclerosis of aorta: Secondary | ICD-10-CM

## 2023-06-16 DIAGNOSIS — K5730 Diverticulosis of large intestine without perforation or abscess without bleeding: Secondary | ICD-10-CM

## 2023-06-16 DIAGNOSIS — F17200 Nicotine dependence, unspecified, uncomplicated: Secondary | ICD-10-CM

## 2023-06-16 DIAGNOSIS — I639 Cerebral infarction, unspecified: Secondary | ICD-10-CM

## 2023-06-16 DIAGNOSIS — Z599 Problem related to housing and economic circumstances, unspecified: Secondary | ICD-10-CM

## 2023-06-16 DIAGNOSIS — E782 Mixed hyperlipidemia: Secondary | ICD-10-CM

## 2023-06-16 MED ORDER — ALBUTEROL SULFATE HFA 108 (90 BASE) MCG/ACT IN AERS: 2.0000 | INHALATION_SPRAY | Freq: Four times a day (QID) | RESPIRATORY_TRACT | 6 refills | Status: AC | PRN

## 2023-06-16 MED ORDER — METFORMIN HCL 1000 MG PO TABS
1000.0000 mg | ORAL_TABLET | Freq: Two times a day (BID) | ORAL | 0 refills | Status: DC
  Filled 2023-08-19: qty 60, 30d supply, fill #0

## 2023-06-16 NOTE — Progress Notes (Signed)
Cardiology Office Note   Date:  06/16/2023   ID:  Jeffery Sweeney, DOB 31-Jan-1961, MRN [id number]  PCP:  Alyson Reedy, FNP  Cardiologist:   Lorine Bears, MD   Chief Complaint  Patient presents with   Chest Pain    Patient here today with concerns of increasing blood pressure with mid sternum chest pain/tightness for past couple days.        History of Present Illness: Jeffery Sweeney is a 63 y.o. male who presents for a follow-up visit regarding coronary artery disease.  He has known history of essential hypertension, tobacco use and hyperlipidemia. The patient had previous PCI in [place] in 2019.  He presented in February of 2022  with unstable angina.  Cardiac catheterization showed significant three-vessel coronary artery disease.  Echo showed normal LV systolic function.  The patient underwent CABG x3 with LIMA to LAD, SVG to right PDA and SVG to ramus.  He was seen in May of 2023 for recurrent anginal symptoms.  He underwent cardiac catheterization which showed severe underlying three-vessel coronary artery disease with patent grafts including LIMA to LAD, SVG to diagonal and SVG to right PDA.  The left circumflex had significant obstructive disease and was not bypassed due to small vessel size.  His LIMA to LAD had an 80% ostial stenosis with hazy appearance.  When the LIMA was engaged, there was acute closure of the graft.  I performed emergent aspiration thrombectomy and drug-eluting stent placement.  He was placed on Aggrastat but had recurrent chest pain and EKG changes after about 6 hours.  Repeat emergent cardiac cath showed acute closure of LIMA to LAD beyond the previously stented segment due to what seemed to be spiral dissection throughout the whole graft.  This was treated successfully with aspiration thrombectomy and 7 overlapped drug-eluting stents to cover the whole length of the LIMA into the native LAD just beyond the anastomosis.    He had  recurrent atypical chest pain since then.  Lexiscan Myoview in November 2023 showed no evidence of ischemia with normal ejection fraction.  He does report exertional dyspnea and also intermittent chest tightness described as soreness in the sternum as well as heavy sensation.  He does not take sublingual nitroglycerin.  He does have right upper quadrant pain and was recently found to have gallstones.  Past Medical History:  Diagnosis Date   CVA (cerebral vascular accident) (HCC) 06/2021   Small acute/subacute infarcts in the left occipital lobe and   Hyperlipidemia LDL goal <70    started on Repatha 09/26/2021, continue Crestor 40mg  daily   Hypertension    Multiple vessel coronary artery disease    Myocardial infarct (HCC)    x 3; PCI with single stent placement for each (in [place])   NSTEMI (non-ST elevated myocardial infarction) (HCC)    LHC 2/18 with multivessel CAD and transfer to [facility] for CABG eval   STEMI (ST elevation myocardial infarction) (HCC) 09/23/2021   LHC 09/23/2021 x 2   Tobacco use    2 cigarettes daily   Unstable angina (HCC)    LHC 09/23/2021 x 2; total 8 DES to LIMA    Past Surgical History:  Procedure Laterality Date   CORONARY ANGIOPLASTY WITH STENT PLACEMENT     in [place] in 2018-19   CORONARY ARTERY BYPASS GRAFT N/A 07/09/2020   Procedure: CORONARY ARTERY BYPASS GRAFTING (CABG) x THREE , USING LEFT INTERNAL MAMMARY ARTERY, AND RIGHT LEG GREATER SAPHENOUS VEIN HARVESTED ENDOSCOPICALLY;  Surgeon:  Delight Ovens, MD;  Location: [facility] OR;  Service: Open Heart Surgery;  Laterality: N/A;   CORONARY PRESSURE/FFR STUDY N/A 07/06/2020   Procedure: INTRAVASCULAR PRESSURE WIRE/FFR STUDY;  Surgeon: Iran Ouch, MD;  Location: [facility] INVASIVE CV LAB;  Service: Cardiovascular;  Laterality: N/A;  LAD   CORONARY STENT INTERVENTION N/A 09/23/2021   Procedure: CORONARY STENT INTERVENTION;  Surgeon: Iran Ouch, MD;  Location: [facility] INVASIVE CV LAB;  Service:  Cardiovascular;  Laterality: N/A;   CORONARY/GRAFT ACUTE MI REVASCULARIZATION N/A 09/23/2021   Procedure: Coronary/Graft Acute MI Revascularization;  Surgeon: Iran Ouch, MD;  Location: [facility] INVASIVE CV LAB;  Service: Cardiovascular;  Laterality: N/A;   LEFT HEART CATH AND CORONARY ANGIOGRAPHY N/A 07/06/2020   Procedure: LEFT HEART CATH AND CORONARY ANGIOGRAPHY;  Surgeon: Iran Ouch, MD;  Location: [facility] INVASIVE CV LAB;  Service: Cardiovascular;  Laterality: N/A;   LEFT HEART CATH AND CORONARY ANGIOGRAPHY N/A 09/23/2021   Procedure: LEFT HEART CATH AND CORONARY ANGIOGRAPHY;  Surgeon: Iran Ouch, MD;  Location: [facility] INVASIVE CV LAB;  Service: Cardiovascular;  Laterality: N/A;   LEFT HEART CATH AND CORS/GRAFTS ANGIOGRAPHY N/A 09/23/2021   Procedure: LEFT HEART CATH AND CORS/GRAFTS ANGIOGRAPHY;  Surgeon: Iran Ouch, MD;  Location: [facility] INVASIVE CV LAB;  Service: Cardiovascular;  Laterality: N/A;   TEE WITHOUT CARDIOVERSION N/A 07/09/2020   Procedure: TRANSESOPHAGEAL ECHOCARDIOGRAM (TEE);  Surgeon: Delight Ovens, MD;  Location: [facility] OR;  Service: Open Heart Surgery;  Laterality: N/A;   WRIST SURGERY Left    for cyst     Current Outpatient Medications  Medication Sig Dispense Refill   albuterol (VENTOLIN HFA) 108 (90 Base) MCG/ACT inhaler Inhale 2 puffs into the lungs every 6 (six) hours as needed for wheezing or shortness of breath. 8 g 6   aspirin 81 MG chewable tablet Chew 1 tablet (81 mg total) by mouth daily.     calcium carbonate (TUMS - DOSED IN MG ELEMENTAL CALCIUM) 500 MG chewable tablet Chew 1 tablet (200 mg of elemental calcium total) by mouth 2 (two) times daily as needed for heartburn.     cetirizine (ZYRTEC ALLERGY) 10 MG tablet Take 1 tablet (10 mg total) by mouth daily. 30 tablet 2   clopidogrel (PLAVIX) 75 MG tablet Take 1 tablet (75 mg total) by mouth daily. 30 tablet 3   isosorbide mononitrate (IMDUR) 60 MG 24 hr tablet Take 1 tablet (60 mg total) by mouth  2 (two) times daily. 180 tablet 0   metFORMIN (GLUCOPHAGE) 1000 MG tablet Take 1 tablet (1,000 mg total) by mouth 2 (two) times daily with a meal. 60 tablet 3   metoprolol tartrate (LOPRESSOR) 50 MG tablet Take 1 tablet (50 mg total) by mouth 2 (two) times daily. 60 tablet 3   nitroGLYCERIN (NITROSTAT) 0.4 MG SL tablet Place 1 tablet (0.4 mg total) under the tongue every 5 (five) minutes as needed for chest pain. 25 tablet 3   pantoprazole (PROTONIX) 40 MG tablet Take 1 tablet (40 mg total) by mouth daily. PLEASE CALL OFFICE TO SCHEDULE APPOINTMENT PRIOR TO NEXT REFILL 90 tablet 0   rosuvastatin (CRESTOR) 40 MG tablet Take 1 tablet by mouth once daily 30 tablet 5   No current facility-administered medications for this visit.    Allergies:   Patient has no known allergies.    Social History:  The patient  reports that he has been smoking cigarettes. He has been exposed to tobacco smoke. He has never used smokeless  tobacco. He reports that he does not currently use alcohol. He reports that he does not currently use drugs.   Family History:  The patient's family history includes Diabetes Mellitus II in his father.    ROS:  Please see the history of present illness.   Otherwise, review of systems are positive for .   All other systems are reviewed and negative.    PHYSICAL EXAM: VS:  BP 132/86 (BP Location: Left Arm, Patient Position: Sitting, Cuff Size: Normal)   Pulse 92   Ht 5\' 3"  (1.6 m)   Wt 181 lb 6.4 oz (82.3 kg)   SpO2 97%   BMI 32.13 kg/m  , BMI Body mass index is 32.13 kg/m. GEN: Well nourished, well developed, in no acute distress  HEENT: normal  Neck: no JVD, carotid bruits, or masses Cardiac: RRR; no murmurs, rubs, or gallops,no edema  Respiratory:  clear to auscultation bilaterally, normal work of breathing GI: soft, nontender, nondistended, + BS MS: no deformity or atrophy  Skin: warm and dry, no rash Neuro:  Strength and sensation are intact Psych: euthymic mood,  full affect    EKG:  EKG is ordered today. EKG showed : Normal sinus rhythm Nonspecific T wave abnormality When compared with ECG of 14-May-2023 23:14, Nonspecific T wave abnormality, worse in Anterior leads    Recent Labs: 06/08/2023: ALT 22; BUN 14; Creatinine, Ser 0.88; Hemoglobin 15.7; Platelets 208; Potassium 4.2; Sodium 132    Lipid Panel    Component Value Date/Time   CHOL 121 12/30/2022 1717   TRIG 376 (H) 12/30/2022 1717   HDL 28 (L) 12/30/2022 1717   CHOLHDL 4.3 12/30/2022 1717   VLDL 75 (H) 12/30/2022 1717   LDLCALC 18 12/30/2022 1717      Wt Readings from Last 3 Encounters:  06/16/23 181 lb 6.4 oz (82.3 kg)  06/16/23 180 lb (81.6 kg)  06/08/23 180 lb (81.6 kg)           No data to display            ASSESSMENT AND PLAN:  1.  Coronary artery disease involving native coronary arteries with other forms of angina: Slight increase in angina since last visit.  EKG is mostly unchanged with intermittent T wave changes in the anterior leads.  I discussed options with him including stress testing or cardiac cath.  After discussion it was determined that his symptoms are not greatly increased since last time and he is going to monitor for now and let us know if there is any change.  Continue metoprolol and Imdur.  Continue lifelong dual antiplatelet therapy as tolerated.  2.  Essential hypertension: His blood pressure was elevated when he went to see his primary care provider this morning.  However, he did not take his morning medications.  His blood pressure seems to be reasonable now.  3.  Hyperlipidemia: He is no longer on Repatha due to cost but continues to take rosuvastatin 40 mg once daily.  Recent lipid profile showed an LDL of 75 which is close to target.  4.  Tobacco use: I discussed with him the importance of complete smoking cessation.  He is down to 1 cigarette a day.   Disposition: Follow-up in 6 months.  Signed,  Lorine Bears, MD   06/16/2023 4:14 PM    [facility]

## 2023-06-16 NOTE — Patient Instructions (Signed)
Medication Instructions:  Your physician recommends that you continue on your current medications as directed. Please refer to the Current Medication list given to you today.  *If you need a refill on your cardiac medications before your next appointment, please call your pharmacy*   Follow-Up: At [facility], you and your health needs are our priority.  As part of our continuing mission to provide you with exceptional heart care, we have created designated Provider Care Teams.  These Care Teams include your primary Cardiologist (physician) and Advanced Practice Providers (APPs -  Physician Assistants and Nurse Practitioners) who all work together to provide you with the care you need, when you need it.  We recommend signing up for the patient portal called "MyChart".  Sign up information is provided on this After Visit Summary.  MyChart is used to connect with patients for Virtual Visits (Telemedicine).  Patients are able to view lab/test results, encounter notes, upcoming appointments, etc.  Non-urgent messages can be sent to your provider as well.   To learn more about what you can do with MyChart, go to [email or web address].    Your next appointment:   6 month(s)  Provider:   Lorine Bears, MD

## 2023-06-16 NOTE — Patient Instructions (Signed)
MyChart:  For all urgent or time sensitive needs we ask that you please call the office to avoid delays. Our number is [phone]. MyChart is not constantly monitored and due to the large volume of messages a day, replies may take up to 72 business hours.   MyChart Policy: MyChart allows for you to see your visit notes, after visit summary, provider recommendations, lab and tests results, make an appointment, request refills, and contact your provider or the office for non-urgent questions or concerns. Providers are seeing patients during normal business hours and do not have built in time to review MyChart messages.  We ask that you allow a minimum of 3 business days for responses to [employer]. For this reason, please do not send urgent requests through MyChart. Please call the office at [phone]. New and ongoing conditions may require a visit. We have virtual and in person visit available for your convenience.  Complex MyChart concerns may require a visit. Your provider may request you schedule a virtual or in person visit to ensure we are providing the best care possible. MyChart messages sent after 11:00 AM on Friday will not be received by the provider until Monday morning.    Lab and Test Results: You will receive your lab and test results on MyChart as soon as they are completed and results have been sent by the lab or testing facility. Due to this service, you will receive your results BEFORE your provider.  I review lab and tests results each morning prior to seeing patients. Some results require collaboration with other providers to ensure you are receiving the most appropriate care. For this reason, we ask that you please allow a minimum of 3-5 business days from the time the ALL results have been received for your provider to receive and review lab and test results and contact you about these.  Most lab and test result comments from the provider will be sent through MyChart.  Your provider may recommend changes to the plan of care, follow-up visits, repeat testing, ask questions, or request an office visit to discuss these results. You may reply directly to this message or call the office at [phone] to provide information for the provider or set up an appointment. In some instances, you will be called with test results and recommendations. Please let us know if this is preferred and we will make note of this in your chart to provide this for you.    If you have not heard a response to your lab or test results in 5 business days from all results returning to MyChart, please call the office to let us know. We ask that you please avoid calling prior to this time unless there is an emergent concern. Due to high call volumes, this can delay the resulting process.   After Hours: For all non-emergency after hours needs, please call the office at [phone] and select the option to reach the on-call provider service. On-call services are shared between multiple [place] offices and therefore it will not be possible to speak directly with your provider. On-call providers may provide medical advice and recommendations, but are unable to provide refills for maintenance medications.  For all emergency or urgent medical needs after normal business hours, we recommend that you seek care at the closest Urgent Care or Emergency Department to ensure appropriate treatment in a timely manner.  [facility] [place] has a 24 hour emergency room located on the ground floor for your  convenience.    Urgent Concerns During the Business Day Providers are seeing patients from 8AM to 5PM, Monday through Thursday, and 8AM to 12PM on Friday with a busy schedule and are most often not able to respond to non-urgent calls until the end of the day or the next business day. If you should have URGENT concerns during the day, please call and speak to the nurse or schedule a same day  appointment so that we can address your concern without delay.    Thank you, again, for choosing me as your health care partner. I appreciate your trust and look forward to learning more about you.    Alyson Reedy, FNP-C

## 2023-06-16 NOTE — Progress Notes (Signed)
New Patient Office Visit  Subjective   Patient ID: Jeffery Sweeney, male    DOB: September 09, 1960  Age: 63 y.o. MRN: [id number]  CC:  Chief Complaint  Patient presents with   Hospitalization Follow-up   HPI Jeffery Sweeney is a 63 year old male who presents to establish with [facility]   CC: Patient here to establish care  Last PCP:  Specialists: cards Q6 months, due around Feb 2025 PMHx: history of HTN, HLD, CAD s/p CABG, CVA (06/2021) I63.9 New-onset DM2 with polyuria & polydipsia. Denies polyphagia   Recently presented to [facility] on 06/08/2023 with abdominal pain and nausea. CT scan completed and pt diagnosed with gallstones. Reports he is still experiencing nausea and RUQ/RLQ abdominal pain. CT scan also showed colonic diverticulosis with no acute diverticulitis.   Reports he frequently has pain in his mid chest, feels like a tightness CT scan shows aortic atherosclerosis  Experiences occasional headaches Does not check BP at home often. Reports his last reading was 190/100 (?). Denies dizziness, SHOB, vision changes, one-sided weakness, speech changes, jaw pain, current headache, radiating arm pain, lower extremity edema.     BP Readings from Last 3 Encounters:  06/16/23 (!) 144/90  06/09/23 (!) 141/84  05/15/23 126/83   Outpatient Encounter Medications as of 06/16/2023  Medication Sig   aspirin 81 MG chewable tablet Chew 1 tablet (81 mg total) by mouth daily.   calcium carbonate (TUMS - DOSED IN MG ELEMENTAL CALCIUM) 500 MG chewable tablet Chew 1 tablet (200 mg of elemental calcium total) by mouth 2 (two) times daily as needed for heartburn.   clopidogrel (PLAVIX) 75 MG tablet Take 1 tablet (75 mg total) by mouth daily.   isosorbide mononitrate (IMDUR) 60 MG 24 hr tablet Take 1 tablet (60 mg total) by mouth 2 (two) times daily.   metoprolol tartrate (LOPRESSOR) 50 MG tablet Take 1 tablet (50 mg total) by mouth 2 (two) times daily.    nitroGLYCERIN (NITROSTAT) 0.4 MG SL tablet Place 1 tablet (0.4 mg total) under the tongue every 5 (five) minutes as needed for chest pain.   pantoprazole (PROTONIX) 40 MG tablet Take 1 tablet (40 mg total) by mouth daily. PLEASE CALL OFFICE TO SCHEDULE APPOINTMENT PRIOR TO NEXT REFILL   rosuvastatin (CRESTOR) 40 MG tablet Take 1 tablet by mouth once daily   [DISCONTINUED] albuterol (VENTOLIN HFA) 108 (90 Base) MCG/ACT inhaler Inhale 2 puffs into the lungs every 6 (six) hours as needed for wheezing or shortness of breath.   [DISCONTINUED] metFORMIN (GLUCOPHAGE) 500 MG tablet Take 1 tablet (500 mg total) by mouth 2 (two) times daily with a meal.   albuterol (VENTOLIN HFA) 108 (90 Base) MCG/ACT inhaler Inhale 2 puffs into the lungs every 6 (six) hours as needed for wheezing or shortness of breath.   cetirizine (ZYRTEC ALLERGY) 10 MG tablet Take 1 tablet (10 mg total) by mouth daily.   Evolocumab (REPATHA SURECLICK) 140 MG/ML SOAJ Inject 140 mg into the skin every 14 (fourteen) days. (Patient not taking: Reported on 06/16/2023)   metFORMIN (GLUCOPHAGE) 1000 MG tablet Take 1 tablet (1,000 mg total) by mouth 2 (two) times daily with a meal.   [DISCONTINUED] fluticasone (FLONASE) 50 MCG/ACT nasal spray Place 2 sprays into both nostrils daily. (Patient not taking: Reported on 06/16/2023)   [DISCONTINUED] meclizine (ANTIVERT) 25 MG tablet Take 1 tablet (25 mg total) by mouth 3 (three) times daily as needed for dizziness. (Patient not taking: Reported on 06/16/2023)  No facility-administered encounter medications on file as of 06/16/2023.    Patient Active Problem List   Diagnosis Date Noted   Obesity (BMI 30-39.9) 12/30/2021   Possible acute cholecystitis 12/30/2021   Multifocal pneumonia 12/29/2021   Severe sepsis (HCC) 12/29/2021   Bacteremia due to Enterobacter species 12/29/2021   Acute respiratory failure with hypoxia (HCC) 12/28/2021   Hyperlipidemia LDL goal <70 09/26/2021   Stroke (cerebrum)  (HCC) 07/05/2021   Vertigo    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   Chest pain 07/06/2020   CAD, multiple vessel 07/06/2020   3-vessel CAD 07/06/2020   Hypertension    Tobacco abuse    Non-ST elevation (NSTEMI) myocardial infarction [facility])    Unstable angina (HCC) 07/05/2020   Past Medical History:  Diagnosis Date   CVA (cerebral vascular accident) (HCC) 06/2021   Small acute/subacute infarcts in the left occipital lobe and   Hyperlipidemia LDL goal <70    started on Repatha 09/26/2021, continue Crestor 40mg  daily   Hypertension    Multiple vessel coronary artery disease    Myocardial infarct (HCC)    x 3; PCI with single stent placement for each (in [place])   NSTEMI (non-ST elevated myocardial infarction) (HCC)    LHC 2/18 with multivessel CAD and transfer to [facility] for CABG eval   STEMI (ST elevation myocardial infarction) (HCC) 09/23/2021   LHC 09/23/2021 x 2   Tobacco use    2 cigarettes daily   Unstable angina (HCC)    LHC 09/23/2021 x 2; total 8 DES to LIMA   Past Surgical History:  Procedure Laterality Date   CORONARY ANGIOPLASTY WITH STENT PLACEMENT     in [place] in 2018-19   CORONARY ARTERY BYPASS GRAFT N/A 07/09/2020   Procedure: CORONARY ARTERY BYPASS GRAFTING (CABG) x THREE , USING LEFT INTERNAL MAMMARY ARTERY, AND RIGHT LEG GREATER SAPHENOUS VEIN HARVESTED ENDOSCOPICALLY;  Surgeon: Delight Ovens, MD;  Location: [facility] OR;  Service: Open Heart Surgery;  Laterality: N/A;   CORONARY PRESSURE/FFR STUDY N/A 07/06/2020   Procedure: INTRAVASCULAR PRESSURE WIRE/FFR STUDY;  Surgeon: Iran Ouch, MD;  Location: [facility] INVASIVE CV LAB;  Service: Cardiovascular;  Laterality: N/A;  LAD   CORONARY STENT INTERVENTION N/A 09/23/2021   Procedure: CORONARY STENT INTERVENTION;  Surgeon: Iran Ouch, MD;  Location: [facility] INVASIVE CV LAB;  Service: Cardiovascular;  Laterality: N/A;   CORONARY/GRAFT ACUTE MI REVASCULARIZATION N/A 09/23/2021   Procedure: Coronary/Graft Acute MI  Revascularization;  Surgeon: Iran Ouch, MD;  Location: [facility] INVASIVE CV LAB;  Service: Cardiovascular;  Laterality: N/A;   LEFT HEART CATH AND CORONARY ANGIOGRAPHY N/A 07/06/2020   Procedure: LEFT HEART CATH AND CORONARY ANGIOGRAPHY;  Surgeon: Iran Ouch, MD;  Location: [facility] INVASIVE CV LAB;  Service: Cardiovascular;  Laterality: N/A;   LEFT HEART CATH AND CORONARY ANGIOGRAPHY N/A 09/23/2021   Procedure: LEFT HEART CATH AND CORONARY ANGIOGRAPHY;  Surgeon: Iran Ouch, MD;  Location: [facility] INVASIVE CV LAB;  Service: Cardiovascular;  Laterality: N/A;   LEFT HEART CATH AND CORS/GRAFTS ANGIOGRAPHY N/A 09/23/2021   Procedure: LEFT HEART CATH AND CORS/GRAFTS ANGIOGRAPHY;  Surgeon: Iran Ouch, MD;  Location: [facility] INVASIVE CV LAB;  Service: Cardiovascular;  Laterality: N/A;   TEE WITHOUT CARDIOVERSION N/A 07/09/2020   Procedure: TRANSESOPHAGEAL ECHOCARDIOGRAM (TEE);  Surgeon: Delight Ovens, MD;  Location: [facility] OR;  Service: Open Heart Surgery;  Laterality: N/A;   WRIST SURGERY Left    for cyst   Family History  Problem Relation Age of  Onset   Diabetes Mellitus II Father    Social History   Socioeconomic History   Marital status: Divorced    Spouse name: Not on file   Number of children: Not on file   Years of education: Not on file   Highest education level: Not on file  Occupational History   Not on file  Tobacco Use   Smoking status: Some Days    Current packs/day: 0.00    Types: Cigarettes    Last attempt to quit: 07/06/2020    Years since quitting: 2.9    Passive exposure: Past   Smokeless tobacco: Never  Vaping Use   Vaping status: Never Used  Substance and Sexual Activity   Alcohol use: Not Currently   Drug use: Not Currently   Sexual activity: Not on file  Other Topics Concern   Not on file  Social History Narrative   Not on file   Social Drivers of Health   Financial Resource Strain: Not on file  Food Insecurity: Not on file  Transportation  Needs: Not on file  Physical Activity: Not on file  Stress: Not on file  Social Connections: Not on file  Intimate Partner Violence: Not on file   Outpatient Medications Prior to Visit  Medication Sig Dispense Refill   aspirin 81 MG chewable tablet Chew 1 tablet (81 mg total) by mouth daily.     calcium carbonate (TUMS - DOSED IN MG ELEMENTAL CALCIUM) 500 MG chewable tablet Chew 1 tablet (200 mg of elemental calcium total) by mouth 2 (two) times daily as needed for heartburn.     clopidogrel (PLAVIX) 75 MG tablet Take 1 tablet (75 mg total) by mouth daily. 30 tablet 3   isosorbide mononitrate (IMDUR) 60 MG 24 hr tablet Take 1 tablet (60 mg total) by mouth 2 (two) times daily. 180 tablet 0   metoprolol tartrate (LOPRESSOR) 50 MG tablet Take 1 tablet (50 mg total) by mouth 2 (two) times daily. 60 tablet 3   nitroGLYCERIN (NITROSTAT) 0.4 MG SL tablet Place 1 tablet (0.4 mg total) under the tongue every 5 (five) minutes as needed for chest pain. 25 tablet 3   pantoprazole (PROTONIX) 40 MG tablet Take 1 tablet (40 mg total) by mouth daily. PLEASE CALL OFFICE TO SCHEDULE APPOINTMENT PRIOR TO NEXT REFILL 90 tablet 0   rosuvastatin (CRESTOR) 40 MG tablet Take 1 tablet by mouth once daily 30 tablet 5   albuterol (VENTOLIN HFA) 108 (90 Base) MCG/ACT inhaler Inhale 2 puffs into the lungs every 6 (six) hours as needed for wheezing or shortness of breath. 8 g 2   metFORMIN (GLUCOPHAGE) 500 MG tablet Take 1 tablet (500 mg total) by mouth 2 (two) times daily with a meal. 60 tablet 1   cetirizine (ZYRTEC ALLERGY) 10 MG tablet Take 1 tablet (10 mg total) by mouth daily. 30 tablet 2   Evolocumab (REPATHA SURECLICK) 140 MG/ML SOAJ Inject 140 mg into the skin every 14 (fourteen) days. (Patient not taking: Reported on 06/16/2023) 2 mL 0   fluticasone (FLONASE) 50 MCG/ACT nasal spray Place 2 sprays into both nostrils daily. (Patient not taking: Reported on 06/16/2023) 9.9 mL 2   meclizine (ANTIVERT) 25 MG tablet Take 1  tablet (25 mg total) by mouth 3 (three) times daily as needed for dizziness. (Patient not taking: Reported on 06/16/2023) 30 tablet 0   No facility-administered medications prior to visit.   No Known Allergies  ROS: see HPI   Objective   Today's  Vitals   06/16/23 0909 06/16/23 0910 06/16/23 1028  BP: (!) 161/87 (!) 169/90 (!) 144/90  Pulse: 96    Resp: 18    Temp:   (!) 97.5 F (36.4 C)  TempSrc:   Oral  SpO2: 96%    Weight: 180 lb (81.6 kg)    Height: 5\' 3"  (1.6 m)     GENERAL: Well-appearing, in NAD. Well nourished.  SKIN: Pink, warm and dry. No rash, lesion, ulceration, or ecchymoses.  Head: Normocephalic. NECK: Trachea midline. Full ROM w/o pain or tenderness. No lymphadenopathy.  EARS: Tympanic membranes are intact, translucent without bulging and without drainage. Appropriate landmarks visualized.  EYES: Conjunctiva clear without exudates. EOMI, PERRL, no drainage present.  NOSE: Septum midline w/o deformity. Nares patent, mucosa pink and non-inflamed w/o drainage. No sinus tenderness.  THROAT: Uvula midline. Oropharynx clear. Tonsils non-inflamed without exudate. Mucous membranes pink and moist.  RESPIRATORY: Chest wall symmetrical. Respirations even and non-labored. Breath sounds clear to auscultation bilaterally.  CARDIAC: S1, S2 present, regular rate and rhythm without murmur or gallops. Peripheral pulses 2+ bilaterally.  MSK: Muscle tone and strength appropriate for age. Joints w/o tenderness, redness, or swelling.  EXTREMITIES: Without clubbing, cyanosis, or edema.  NEUROLOGIC: No motor or sensory deficits. Steady, even gait. C2-C12 intact.  PSYCH/MENTAL STATUS: Alert, oriented x 3. Cooperative, appropriate mood and affect.     Assessment & Plan:   1. Encounter to establish care (Primary) Patient is a 42- year-old male who presents today to establish care with [facility] at [facility]. Reviewed the past medical history, family history, social history,  surgical history, medications and allergies today- updates made as indicated.   2. Primary hypertension Review of chart- patient has extensive cardiac history, including HTN, multiple vessel CAD s/p CABG, NSTEMI & STEMI, angina, and mixed HLD. Patient currently taking metoprolol tartrate 50mg  twice daily, & isosorbide mononitrate 60mg  twice daily.   3. Mixed hyperlipidemia Currently taking rosuvastatin 40mg  daily. Patient was taking Repatha in 2023. Patient would significantly benefit from Repatha due to cardiac history. Will obtain fasting lipid panel today. Reasonable to try and have patient restart this medication.   4. Aortic atherosclerosis (HCC) See #3 - Lipid Profile  5. S/P CABG (coronary artery bypass graft) Followed by cardiology.   6. Stented coronary artery Currently taking Plavix 75mg  daily & aspirin 81mg  daily. Followed by cardiology every 6 months.   7. Coronary artery disease of bypass graft of native heart with stable angina pectoris [facility]) Patient denies using his nitroglycerin within the past month. Reports chest pain with exertion. Marybelle Killings, CMA called patient's cardiologist office and was able to get patient an appointment this afternoon at 1600. Will defer EKG and adjustments to medication regimen to specialist. Advised patient to show up to scheduled appointment with specialist.    8. Gallstones Discussed lifestyle modifications. Advised patient when to seek emergency care. Will check liver function and may be reasonable to place GI referral.  - Comprehensive metabolic panel  9. Diverticulosis of colon Found on CT scan. Discussed diet modifications and increase in fiber.   10. Tobacco use disorder Discussed importance of smoking cessation. Patient not ready to quit.   11. Elevated blood sugar Review of chart from ED visit 06/08/2023- CBG 334 mg/dL. Hemoglobin A1c not added. Patient was started on metformin 500mg  twice daily. UA with >500 glucose. Negative  for ketones and protein. Patient reports he is tolerating metformin with no GI effects. Advised patient to increase metformin to 1000mg   twice daily. Will obtain A1c today to determine if additional pharmacotherapy is needed for adequate control.  - Comprehensive metabolic panel - Hemoglobin A1c  12. Financial problems Patient does not have insurance. Would benefit from medication assistance and adherence, along with case management assistance.  - AMB Referral VBCI Care Management  13. Need for influenza vaccination Agreeable to flu vaccine today.  - Flu vaccine trivalent PF, 6mos and older(Flulaval,Afluria,Fluarix,Fluzone)   Return in about 3 months (around 09/14/2023) for Chro[place] conditions.   Alyson Reedy, FNP

## 2023-06-17 ENCOUNTER — Telehealth: Payer: Self-pay | Admitting: *Deleted

## 2023-06-17 LAB — LIPID PANEL
Chol/HDL Ratio: 4.5 {ratio} (ref 0.0–5.0)
Cholesterol, Total: 143 mg/dL (ref 100–199)
HDL: 32 mg/dL — ABNORMAL LOW (ref >39)
LDL Chol Calc (NIH): 75 mg/dL (ref 0–99)
Triglycerides: 217 mg/dL — ABNORMAL HIGH (ref 0–149)
VLDL Cholesterol Cal: 36 mg/dL (ref 5–40)

## 2023-06-17 LAB — COMPREHENSIVE METABOLIC PANEL
ALT: 28 [IU]/L (ref 0–44)
AST: 22 [IU]/L (ref 0–40)
Albumin: 4.4 g/dL (ref 3.9–4.9)
Alkaline Phosphatase: 98 [IU]/L (ref 44–121)
BUN/Creatinine Ratio: 17 (ref 10–24)
BUN: 15 mg/dL (ref 8–27)
Bilirubin Total: 1 mg/dL (ref 0.0–1.2)
CO2: 19 mmol/L — ABNORMAL LOW (ref 20–29)
Calcium: 9.5 mg/dL (ref 8.6–10.2)
Chloride: 100 mmol/L (ref 96–106)
Creatinine, Ser: 0.87 mg/dL (ref 0.76–1.27)
Globulin, Total: 2.9 g/dL (ref 1.5–4.5)
Glucose: 170 mg/dL — ABNORMAL HIGH (ref 70–99)
Potassium: 4.4 mmol/L (ref 3.5–5.2)
Sodium: 134 mmol/L (ref 134–144)
Total Protein: 7.3 g/dL (ref 6.0–8.5)
eGFR: 97 mL/min/{1.73_m2} (ref >59)

## 2023-06-17 LAB — HEMOGLOBIN A1C
Est. average glucose Bld gHb Est-mCnc: 286 mg/dL
Hgb A1c MFr Bld: 11.6 % — ABNORMAL HIGH (ref 4.8–5.6)

## 2023-06-17 NOTE — Progress Notes (Signed)
Complex Care Management Note  Care Guide Note 06/17/2023 Name: Jeffery Sweeney MRN: [id number] DOB: 1960-12-14  Jeffery Sweeney is a 63 y.o. year old male who sees Alyson Reedy, FNP for primary care. I reached out to [name] by phone today to offer complex care management services.  Jeffery Sweeney was given information about Complex Care Management services today including:   The Complex Care Management services include support from the care team which includes your Nurse Coordinator, Clinical Social Worker, or Pharmacist.  The Complex Care Management team is here to help remove barriers to the health concerns and goals most important to you. Complex Care Management services are voluntary, and the patient may decline or stop services at any time by request to their care team member.   Complex Care Management Consent Status: Patient agreed to services and verbal consent obtained.   Follow up plan:  Telephone appointment with complex care management team member scheduled for:  06/24/2023  Encounter Outcome:  Patient Scheduled  Burman Nieves, CMA, Care Guide [facility]  [facility], [facility] Guide Direct Dial: [phone]  Fax: [phone] Website: [email or web address]

## 2023-06-18 NOTE — Telephone Encounter (Signed)
Per DPR, left detailed message with recommendations. Pt advised to contact office with any further questions or concerns.

## 2023-06-18 NOTE — Progress Notes (Signed)
Cholesterol and LDL discussed with patient at the last appointment

## 2023-06-19 ENCOUNTER — Other Ambulatory Visit: Payer: Self-pay | Admitting: Family Medicine

## 2023-06-19 DIAGNOSIS — I639 Cerebral infarction, unspecified: Secondary | ICD-10-CM

## 2023-06-19 DIAGNOSIS — E1165 Type 2 diabetes mellitus with hyperglycemia: Secondary | ICD-10-CM | POA: Insufficient documentation

## 2023-06-19 DIAGNOSIS — F17200 Nicotine dependence, unspecified, uncomplicated: Secondary | ICD-10-CM

## 2023-06-19 DIAGNOSIS — I200 Unstable angina: Secondary | ICD-10-CM

## 2023-06-19 DIAGNOSIS — I25708 Atherosclerosis of coronary artery bypass graft(s), unspecified, with other forms of angina pectoris: Secondary | ICD-10-CM

## 2023-06-19 DIAGNOSIS — E782 Mixed hyperlipidemia: Secondary | ICD-10-CM

## 2023-06-19 DIAGNOSIS — Z955 Presence of coronary angioplasty implant and graft: Secondary | ICD-10-CM

## 2023-06-19 DIAGNOSIS — Z951 Presence of aortocoronary bypass graft: Secondary | ICD-10-CM

## 2023-06-19 DIAGNOSIS — I2510 Atherosclerotic heart disease of native coronary artery without angina pectoris: Secondary | ICD-10-CM

## 2023-06-19 DIAGNOSIS — I700 Atherosclerosis of aorta: Secondary | ICD-10-CM

## 2023-06-19 DIAGNOSIS — I10 Essential (primary) hypertension: Secondary | ICD-10-CM

## 2023-06-19 MED ORDER — REPATHA SURECLICK 140 MG/ML ~~LOC~~ SOAJ: 140.0000 mg | SUBCUTANEOUS | 3 refills | Status: DC

## 2023-06-19 MED ORDER — TRULICITY 0.75 MG/0.5ML ~~LOC~~ SOAJ: 0.7500 mg | SUBCUTANEOUS | 2 refills | Status: DC

## 2023-06-22 NOTE — Telephone Encounter (Unsigned)
Copied from CRM [id number]. Topic: Clinical - Prescription Issue >> Jun 19, 2023  5:17 PM Antony Haste wrote: Reason for CRM: PT visited his pharmacy and was advised both Dulaglutide (TRULICITY) 0.75 MG/0.5ML SOAJ and Evolocumab (REPATHA SURECLICK) 140 MG/ML SOAJ will cost $1000, he is wanting to determine if an alternative can be called in? Callback #: [phone]

## 2023-06-23 ENCOUNTER — Other Ambulatory Visit: Payer: Self-pay

## 2023-06-23 ENCOUNTER — Emergency Department
Admission: EM | Admit: 2023-06-23 | Discharge: 2023-06-23 | Disposition: A | Discharge status: Home / Self Care | Payer: Self-pay | Attending: Emergency Medicine | Admitting: Emergency Medicine

## 2023-06-23 ENCOUNTER — Emergency Department: Payer: Self-pay

## 2023-06-23 ENCOUNTER — Telehealth: Payer: Self-pay

## 2023-06-23 ENCOUNTER — Ambulatory Visit: Payer: Self-pay | Admitting: Family Medicine

## 2023-06-23 DIAGNOSIS — E119 Type 2 diabetes mellitus without complications: Secondary | ICD-10-CM | POA: Insufficient documentation

## 2023-06-23 DIAGNOSIS — R42 Dizziness and giddiness: Secondary | ICD-10-CM | POA: Insufficient documentation

## 2023-06-23 DIAGNOSIS — Z8673 Personal history of transient ischemic attack (TIA), and cerebral infarction without residual deficits: Secondary | ICD-10-CM | POA: Insufficient documentation

## 2023-06-23 DIAGNOSIS — I10 Essential (primary) hypertension: Secondary | ICD-10-CM | POA: Insufficient documentation

## 2023-06-23 DIAGNOSIS — Z20822 Contact with and (suspected) exposure to covid-19: Secondary | ICD-10-CM | POA: Insufficient documentation

## 2023-06-23 DIAGNOSIS — R519 Headache, unspecified: Secondary | ICD-10-CM | POA: Insufficient documentation

## 2023-06-23 LAB — COMPREHENSIVE METABOLIC PANEL
ALT: 23 U/L (ref 0–44)
AST: 29 U/L (ref 15–41)
Albumin: 4.2 g/dL (ref 3.5–5.0)
Alkaline Phosphatase: 60 U/L (ref 38–126)
Anion gap: 14 (ref 5–15)
BUN: 19 mg/dL (ref 8–23)
CO2: 21 mmol/L — ABNORMAL LOW (ref 22–32)
Calcium: 9.2 mg/dL (ref 8.9–10.3)
Chloride: 100 mmol/L (ref 98–111)
Creatinine, Ser: 1.03 mg/dL (ref 0.61–1.24)
GFR, Estimated: >60 mL/min (ref >60)
Glucose, Bld: 161 mg/dL — ABNORMAL HIGH (ref 70–99)
Potassium: 3.7 mmol/L (ref 3.5–5.1)
Sodium: 135 mmol/L (ref 135–145)
Total Bilirubin: 1.7 mg/dL — ABNORMAL HIGH (ref 0.0–1.2)
Total Protein: 7.8 g/dL (ref 6.5–8.1)

## 2023-06-23 LAB — RESP PANEL BY RT-PCR (RSV, FLU A&B, COVID)  RVPGX2
Influenza A by PCR: NEGATIVE
Influenza B by PCR: NEGATIVE
Resp Syncytial Virus by PCR: NEGATIVE
SARS Coronavirus 2 by RT PCR: NEGATIVE

## 2023-06-23 LAB — CBC WITH DIFFERENTIAL/PLATELET
Abs Immature Granulocytes: 0.03 10*3/uL (ref 0.00–0.07)
Basophils Absolute: 0.1 10*3/uL (ref 0.0–0.1)
Basophils Relative: 1 %
Eosinophils Absolute: 0.3 10*3/uL (ref 0.0–0.5)
Eosinophils Relative: 3 %
HCT: 44.7 % (ref 39.0–52.0)
Hemoglobin: 15.5 g/dL (ref 13.0–17.0)
Immature Granulocytes: 0 %
Lymphocytes Relative: 44 %
Lymphs Abs: 4.4 10*3/uL — ABNORMAL HIGH (ref 0.7–4.0)
MCH: 29.6 pg (ref 26.0–34.0)
MCHC: 34.7 g/dL (ref 30.0–36.0)
MCV: 85.5 fL (ref 80.0–100.0)
Monocytes Absolute: 0.6 10*3/uL (ref 0.1–1.0)
Monocytes Relative: 6 %
Neutro Abs: 4.6 10*3/uL (ref 1.7–7.7)
Neutrophils Relative %: 46 %
Platelets: 241 10*3/uL (ref 150–400)
RBC: 5.23 MIL/uL (ref 4.22–5.81)
RDW: 12.7 % (ref 11.5–15.5)
WBC: 9.9 10*3/uL (ref 4.0–10.5)
nRBC: 0 % (ref 0.0–0.2)

## 2023-06-23 LAB — TROPONIN I (HIGH SENSITIVITY)
Troponin I (High Sensitivity): 3 ng/L (ref <18)
Troponin I (High Sensitivity): 2 ng/L (ref <18)

## 2023-06-23 MED ORDER — MECLIZINE HCL 25 MG PO TABS: 25.0000 mg | ORAL_TABLET | Freq: Three times a day (TID) | ORAL | 0 refills | Status: DC | PRN

## 2023-06-23 NOTE — Progress Notes (Signed)
 Care Guide Pharmacy Note  06/23/2023 Name: Jeffery Sweeney MRN: [id number] DOB: 10-11-1960  Referred By: Towana Small, FNP Reason for referral: Care Coordination (Outreach to schedule with pharm d )   [name] is a 63 y.o. year old male who is a primary care patient of Towana Small, FNP.  [name] was referred to the pharmacist for assistance related to: DMII  Successful contact was made with the patient to discuss pharmacy services including being ready for the pharmacist to call at least 5 minutes before the scheduled appointment time and to have medication bottles and any blood pressure readings ready for review. The patient agreed to meet with the pharmacist via telephone visit on (date/time).07/10/2023  [name] , RMA     [place]  [facility], [facility] Guide  Direct Dial: [phone]  Website: [email or web address]

## 2023-06-23 NOTE — ED Provider Notes (Signed)
 [facility] Provider Note    Event Date/Time   First MD Initiated Contact with Patient 06/23/23 1943     (approximate)  History   Chief Complaint: Dizziness  HPI  Jeffery Sweeney is a 63 y.o. male with a past medical history of prior CVA, hyperlipidemia, NSTEMI, presents to the emergency department for dizziness.  According to the patient for the last 2 days he has been experiencing intermittent dizziness which she describes as a spinning sensation.  Denies any chest pain or nausea denies any weakness or numbness of any arm or leg.  Patient denies any history of vertigo previously.  Patient states he missed work for the last 2 days due to his symptoms and his boss made him come to the emergency department for evaluation.  Patient denies any symptoms currently.  Physical Exam   Triage Vital Signs: ED Triage Vitals [06/23/23 1325]  Encounter Vitals Group     BP (!) 139/90     Systolic BP Percentile      Diastolic BP Percentile      Pulse Rate 79     Resp 16     Temp 97.6 F (36.4 C)     Temp Source Oral     SpO2 97 %     Weight 177 lb 11.1 oz (80.6 kg)     Height 5' 3 (1.6 m)     Head Circumference      Peak Flow      Pain Score 4     Pain Loc      Pain Education      Exclude from Growth Chart     Most recent vital signs: Vitals:   06/23/23 1632 06/23/23 1752  BP: (!) 131/90   Pulse: 78   Resp: 18   Temp:  98 F (36.7 C)  SpO2: 98%     General: Awake, no distress.  CV:  Good peripheral perfusion.  Regular rate and rhythm  Resp:  Normal effort.  Equal breath sounds bilaterally.  Abd:  No distention.  Soft, nontender.  No rebound or guarding.  ED Results / Procedures / Treatments   EKG  EKG viewed and interpreted by myself shows a normal sinus rhythm at 79 bpm with a narrow QRS, normal axis, normal intervals, no concerning ST changes.  RADIOLOGY  I have reviewed and interpreted CT head images.  No large bleed seen on my  evaluation. Radiology is read the CT scan is negative for acute process.   MEDICATIONS ORDERED IN ED: Medications - No data to display   IMPRESSION / MDM / ASSESSMENT AND PLAN / ED COURSE  I reviewed the triage vital signs and the nursing notes.  Patient's presentation is most consistent with acute presentation with potential threat to life or bodily function.  Patient presents emergency department for 2 days of intermittent dizziness described as a spinning sensation.  Patient denies any weakness or numbness of any arm or leg confusion or slurred speech.  Patient's description of symptoms sound very suggestive of BPPV.  Patient's workup in the emergency department shows a reassuring CBC, negative troponin x 2, reassuring chemistry.  Respiratory panel is negative as well.  CT scan shows no concerning findings and EKG reassuring.  However as the patient has never had an episode of vertigo previously I discussed with the patient my recommendation to proceed with an MRI.  Patient reports he has been in the emergency department for an extended period of time due  to a prolonged wait in the waiting room.  Patient states he is ready to go home does not wish for the MRI today.  I discussed with the patient a trial of meclizine  however also discussed if his symptoms fail to resolve or worsen or develops any weakness or numbness of any arm or leg or speech difficulties he is to return immediately to the emergency department for further evaluation.  Patient agreeable to plan of care.  FINAL CLINICAL IMPRESSION(S) / ED DIAGNOSES   Vertigo  Rx / DC Orders   Meclizine   Note:  This document was prepared using Dragon voice recognition software and may include unintentional dictation errors.   Dorothyann Drivers, MD 06/23/23 2009

## 2023-06-23 NOTE — ED Triage Notes (Signed)
 Pt c/o dizziness that started yesterday. Pt describes room as spinning. Pt says its worse when turning his head and it feels like it weighs 50 pounds. Pt denies n/v, but endorses HA. Pt reports onset of sx at noon yesterday. Pt has hx of NIDDM and HTN. Pt reports he is compliant with medications.

## 2023-06-23 NOTE — ED Notes (Signed)
See triage notes. Patient c/o of being dizzy and the room spinning since yesterday

## 2023-06-23 NOTE — Telephone Encounter (Signed)
  Chief Complaint: dizziness Symptoms: room spinning Frequency: yesterday  Disposition: [x] ED /[] Urgent Care (no appt availability in office) / [] Appointment(In office/virtual)/ []  [facility]/ [] Home Care/ [] Refused Recommended Disposition /[] [facility]/ []  Follow-up with PCP  Additional Notes: Pt Aox4/4. Pt states never had this before until he started the medication that starts with an 'M'.  Does not feel like he is going to faint. Pt states that his tongue has been feeling different, when prompted pt states that the tongue has ben intermittently going numb today.  At this time pt advised to call 911 and seek ED treatment. Pt states that he has to go to work. RN educated pt that he may be having a stroke and that he should be seen ASAP. Pt agreeable to call 911 and go to ED.   Copied from CRM [id number]. Topic: Clinical - Red Word Triage >> Jun 23, 2023 11:57 AM Nathanel DEL wrote: Red Word that prompted transfer to Nurse Triage: pt states yesterday he had an episode of the room spinning round and round.  Today had a slight one.  Pt is concerned he may have one while driving. Reason for Disposition  Sounds like a life-threatening emergency to the triager  Answer Assessment - Initial Assessment Questions 1. DESCRIPTION: Describe your dizziness.     Room spinning 2. VERTIGO: Do you feel like either you or the room is spinning or tilting?      Room spinning 3. LIGHTHEADED: Do you feel lightheaded? (e.g., somewhat faint, woozy, weak upon standing)     denies 4. SEVERITY: How bad is it?  Can you walk?   - MILD: Feels slightly dizzy and unsteady, but is walking normally.   - MODERATE: Feels unsteady when walking, but not falling; interferes with normal activities (e.g., school, work).   - SEVERE: Unable to walk without falling, or requires assistance to walk without falling.     Worse with sitting and standing, but still occurs during the supine position 5. ONSET:   When did the dizziness begin?     yesterday 6. AGGRAVATING FACTORS: Does anything make it worse? (e.g., standing, change in head position)     Feels like room is spinning at times 7. CAUSE: What do you think is causing the dizziness?     New medication that I am on, starts with an 'M', pt confirms it might be metoprolol  8. RECURRENT SYMPTOM: Have you had dizziness before? If Yes, ask: When was the last time? What happened that time?     Denies having it like this before 9. OTHER SYMPTOMS: Do you have any other symptoms? (e.g., headache, weakness, numbness, vomiting, earache)     +Ha, denies numbness/weakness,  Protocols used: Dizziness - Vertigo-A-AH

## 2023-06-23 NOTE — ED Provider Triage Note (Signed)
 Emergency Medicine Provider Triage Evaluation Note  Jeffery Sweeney , a 63 y.o. male  was evaluated in triage.  Pt complains of dizziness that started yesterday.  Review of Systems  Positive: Dizziness, + hx of CVA, HTN and DM Negative: N, V  Physical Exam  BP (!) 139/90   Pulse 79   Temp 97.6 F (36.4 C) (Oral)   Resp 16   Ht 5' 3 (1.6 m)   Wt 80.6 kg   SpO2 97%   BMI 31.48 kg/m  Gen:   Awake, no distress   Resp:  Normal effort  MSK:   Moves extremities without difficulty  Other:    Medical Decision Making  Medically screening exam initiated at 1:26 PM.  Appropriate orders placed.  Jeffery Sweeney was informed that the remainder of the evaluation will be completed by another provider, this initial triage assessment does not replace that evaluation, and the importance of remaining in the ED until their evaluation is complete.     Jeffery Shona CROME, PA-C 06/23/23 1327

## 2023-06-24 ENCOUNTER — Ambulatory Visit (INDEPENDENT_AMBULATORY_CARE_PROVIDER_SITE_OTHER): Payer: Self-pay | Admitting: Family Medicine

## 2023-06-24 ENCOUNTER — Ambulatory Visit: Payer: Self-pay

## 2023-06-24 ENCOUNTER — Encounter: Payer: Self-pay | Admitting: Family Medicine

## 2023-06-24 VITALS — BP 111/79 | HR 88 | Temp 97.7°F | Resp 18 | Ht 63.0 in | Wt 178.0 lb

## 2023-06-24 DIAGNOSIS — R3589 Other polyuria: Secondary | ICD-10-CM

## 2023-06-24 DIAGNOSIS — R42 Dizziness and giddiness: Secondary | ICD-10-CM

## 2023-06-24 DIAGNOSIS — E1165 Type 2 diabetes mellitus with hyperglycemia: Secondary | ICD-10-CM

## 2023-06-24 LAB — POCT URINALYSIS DIPSTICK
Blood, UA: NEGATIVE
Glucose, UA: POSITIVE — AB
Leukocytes, UA: NEGATIVE
Nitrite, UA: NEGATIVE
Protein, UA: POSITIVE — AB
Spec Grav, UA: 1.02 (ref 1.010–1.025)
Urobilinogen, UA: 1 U/dL
pH, UA: 5.5 (ref 5.0–8.0)

## 2023-06-24 LAB — POCT GLUCOSE (DEVICE FOR HOME USE): POC Glucose: 132 mg/dL — AB (ref 70–99)

## 2023-06-24 NOTE — Patient Instructions (Signed)
 Visit Information  Thank you for taking time to visit with me today. Please don't hesitate to contact me if I can be of assistance to you.   Following are the goals we discussed today:  Patient will contact sister to assist with enrolling with [employer] and [employer] to set up blister pack.   If you are experiencing a Mental Health or Behavioral Health Crisis or need someone to talk to, please call 911  Patient verbalizes understanding of instructions and care plan provided today and agrees to view in MyChart. Active MyChart status and patient understanding of how to access instructions and care plan via MyChart confirmed with patient.     No further follow up required: Patient does not  request a follow up visit.  Tillman Gardener, BSW [place]  [facility], [facility] Social Worker Direct Dial: [phone]  Fax: [phone] Website: [email or web address]

## 2023-06-24 NOTE — Patient Outreach (Signed)
  Care Coordination   Initial Visit Note   06/24/2023 Name: Kvon Mcilhenny MRN: [id number] DOB: 29-Mar-1961  Darrill Vreeland Schiller is a 63 y.o. year old male who sees Towana Small, FNP for primary care. I spoke with  [name] by phone today.  What matters to the patients health and wellness today?  Patient needs food and insurance.    Goals Addressed             This Visit's Progress    Care Coordination Activities       Interventions Today    Flowsheet Row Most Recent Value  Chronic Disease   Chronic disease during today's visit Diabetes, Hypertension (HTN)  General Interventions   General Interventions Discussed/Reviewed General Interventions Discussed, General Interventions Reviewed, [employer]  [Pt needs insurance.Sister helps managing affairs.SW provided [employer] to sign up.Pt forgets rx.SW suggest blister pack at his drug store.Pt also needs help with food.SW will send a list of food banks.]  Education Interventions   Education Provided Provided Education  [SW educated patient on how the Marketplace(healthcare.gov) insurance process works and that Medicaid will be determined with the same application.Patient can select an affordable option for insurance.]              SDOH assessments and interventions completed:  Yes  SDOH Interventions Today    Flowsheet Row Most Recent Value  SDOH Interventions   Food Insecurity Interventions Other (Comment)  [Provide list of foodbank]  Housing Interventions Intervention Not Indicated  Transportation Interventions Intervention Not Indicated  [Has a car]  Utilities Interventions Intervention Not Indicated        Care Coordination Interventions:  Yes, provided   Follow up plan: No further intervention required.   Encounter Outcome:  Patient Visit Completed

## 2023-06-24 NOTE — Patient Instructions (Addendum)
*  Please continue to apply for health insurance.  *I will update you of your kidney function related to your elevated blood sugars when your results come back.  *Continue taking metformin  1,000mg  twice daily.  *Focus on healthy diet and daily activity.  *If you continue to experience dizziness, with the meclizine , please call office so we can see you sooner than scheduled follow-up appointment.

## 2023-06-24 NOTE — Progress Notes (Signed)
 Established Patient Office Visit  Subjective  Patient ID: Jeffery Sweeney, male    DOB: [birth year]  Age: 63 y.o. MRN: [id number]  Chief Complaint  Patient presents with   Hospitalization Follow-up   HOSPITAL FOLLOW UP: Jeffery Sweeney is a pleasant 63 year old male patient who presents for hospital follow up.  Hospital/Facility: went to the [facility] for intermittent dizziness   Denies neurological deficits, changes to speech, weakness, facial drooping.  Discharge Diagnosis: BPPV Blood sugar on CMP: 161 mg/dL  eGFR >39 New Medications: meclizine -- he has not picked up this medication due to the pharmacy being closed when he was discharged.   Currently symptoms feel like I'm on a merry go round If he keeps his head still he is fine Reports some tinnitus, nausea, polydipsia, polyuria.   Denies chest pain, shortness of breath, abdominal pain, vision changes.  Ate bowl of cereal and donut around 10am  Trulicity  was ordered at last OV but patient is unable to afford medication.  Currently on metformin  1000mg  twice daily  Most recent A1c done on 1/28 with results of 11.6%  Review of Systems  Constitutional:  Negative for malaise/fatigue.  Eyes:  Negative for blurred vision and double vision.  Respiratory:  Negative for cough and shortness of breath.   Cardiovascular:  Negative for chest pain, palpitations and leg swelling.  Gastrointestinal:  Positive for nausea. Negative for abdominal pain, constipation, diarrhea and vomiting.  Genitourinary:  Negative for dysuria, frequency and urgency.  Neurological:  Positive for dizziness. Negative for sensory change, speech change and focal weakness.  Endo/Heme/Allergies:  Positive for polydipsia.    Objective:    BP 111/79 (BP Location: Left Arm, Patient Position: Sitting, Cuff Size: Normal)   Pulse 88   Temp 97.7 F (36.5 C) (Oral)   Resp 18   Ht 5' 3 (1.6 m)   Wt 178 lb (80.7 kg)   SpO2 93%   BMI 31.53 kg/m  BP Readings from  Last 3 Encounters:  06/24/23 111/79  06/23/23 116/84  06/16/23 132/86    Physical Exam Vitals reviewed.  Constitutional:      Appearance: Normal appearance.  Cardiovascular:     Rate and Rhythm: Normal rate and regular rhythm.     Pulses: Normal pulses.     Heart sounds: Normal heart sounds.  Pulmonary:     Effort: Pulmonary effort is normal.     Breath sounds: Normal breath sounds.  Skin:    General: Skin is warm and dry.  Neurological:     General: No focal deficit present.     Mental Status: He is alert.     Cranial Nerves: Cranial nerves 2-12 are intact.     Sensory: Sensation is intact.     Motor: Motor function is intact.     Gait: Gait is intact.  Psychiatric:        Mood and Affect: Mood normal.        Behavior: Behavior normal.     Assessment & Plan:   1. Vertigo (Primary) Patient presents today for hospital follow-up.  He was seen yesterday at [facility] for intermittent dizziness which he describes as a spinning sensation.  Denies chest pain, shortness of breath, weakness, confusion, vision changes, diaphoresis, lower extremity edema, or speech difficulties. He does note tinnitus and slight nausea. Review of CT head with no acute intracranial process. Atherosclerotic calcifications in the intracranial carotid and vertebral arteries.  EKG findings reassuring. Provider recommended MRI, however, patient did not want to wait to  have this completed.  No neurological deficits noted on exam.  Cranial nerves II through XII are intact, with no focal deficits.  Counseled patient to pick up meclizine  prescription from pharmacy and see if this helps improve his dizziness.  Advised patient to reach out to office if the symptoms do not improve as it would be reasonable to order an MRI. Discussed emergency precautions and when to seek emergency care.   2. Polyuria Patient presents today with reports of polyuria and polydipsia.  Recent A1c 11.6%.  Review of CMP from ED visit showed random  glucose of 161.  In office today, random glucose 132. UA performed in office with positive results for glucose (100mg /dL) and protein.  Counseled patient to continue metformin  1000 mg once twice daily.  Trulicity  recently sent to pharmacy but patient was unable to afford due to lack of insurance.  Will check urine microalbumin/creatinine ratio today to assess renal function  in relation to uncontrolled diabetes. Discussed lifestyle modifications- including healthy diet and physical activity. Follow-up in 3 months.  - Urine microalbumin-creatinine with uACR - POCT Glucose (Device for Home Use) - POCT urinalysis dipstick  3. Uncontrolled type 2 diabetes mellitus with hyperglycemia (HCC) See #2 Recent A1c 11.6%. Plan to recheck hemoglobin A1c around 09/14/2023.    Return in about 3 months (around 09/14/2023) for Diabetes f/u.   Evalene Arts, FNP

## 2023-06-25 LAB — MICROALBUMIN / CREATININE URINE RATIO
Creatinine, Urine: 208.3 mg/dL
Microalb/Creat Ratio: 33 mg/g{creat} — ABNORMAL HIGH (ref 0–29)
Microalbumin, Urine: 67.9 ug/mL

## 2023-06-25 NOTE — Progress Notes (Signed)
 Please let patient know:  Urine microalbumin/creatinine ratio is slightly elevated, meaning there is slight decline in kidney function related to your diabetes. As long as we continue to get your blood sugars under control, we can prevent further damage to your kidneys.

## 2023-07-10 ENCOUNTER — Ambulatory Visit: Payer: Self-pay | Admitting: Family Medicine

## 2023-07-10 ENCOUNTER — Encounter: Payer: Self-pay | Admitting: Family Medicine

## 2023-07-10 ENCOUNTER — Other Ambulatory Visit: Payer: Self-pay

## 2023-07-10 ENCOUNTER — Other Ambulatory Visit: Payer: Self-pay | Admitting: Pharmacist

## 2023-07-10 ENCOUNTER — Ambulatory Visit (INDEPENDENT_AMBULATORY_CARE_PROVIDER_SITE_OTHER): Payer: Self-pay | Admitting: Family Medicine

## 2023-07-10 VITALS — BP 135/89 | HR 82 | Temp 97.5°F | Resp 18 | Ht 63.0 in | Wt 173.0 lb

## 2023-07-10 DIAGNOSIS — R10811 Right upper quadrant abdominal tenderness: Secondary | ICD-10-CM

## 2023-07-10 DIAGNOSIS — E1165 Type 2 diabetes mellitus with hyperglycemia: Secondary | ICD-10-CM

## 2023-07-10 DIAGNOSIS — I10 Essential (primary) hypertension: Secondary | ICD-10-CM

## 2023-07-10 DIAGNOSIS — I2510 Atherosclerotic heart disease of native coronary artery without angina pectoris: Secondary | ICD-10-CM

## 2023-07-10 DIAGNOSIS — K8070 Calculus of gallbladder and bile duct without cholecystitis without obstruction: Secondary | ICD-10-CM

## 2023-07-10 MED ORDER — TRULICITY 0.75 MG/0.5ML ~~LOC~~ SOAJ: 0.7500 mg | SUBCUTANEOUS | 2 refills | Status: DC

## 2023-07-10 MED ORDER — ISOSORBIDE MONONITRATE ER 60 MG PO TB24
60.0000 mg | ORAL_TABLET | Freq: Two times a day (BID) | ORAL | 1 refills | Status: DC
  Filled 2023-07-10: qty 90, 45d supply, fill #0
  Filled 2023-09-22: qty 30, 15d supply, fill #1

## 2023-07-10 NOTE — Telephone Encounter (Signed)
Chief Complaint: Abd pain Symptoms: pain with coughing and sneezing Frequency: Ongoing for about 1 week Pertinent Negatives: Patient denies fever, N/V Disposition: [] ED /[] Urgent Care (no appt availability in office) / [x] Appointment(In office/virtual)/ []  [facility]/ [] Home Care/ [] Refused Recommended Disposition /[] [facility]/ []  Follow-up with PCP Additional Notes: Pt reports he was dx with gallstones a few weeks ago. He notes he has been having sharp/shooting pain in his right upper abd under the ribs at 8/10 every time he sneezes or coughs. Pt also notes diarrhea that is consistent with any intake. Pt denies fever, N/V. OV scheduled this AM. This RN educated pt on home care, new-worsening symptoms, when to call back/seek emergent care. Pt verbalized understanding and agrees to plan.    Copied from CRM [id number]. Topic: Clinical - Red Word Triage >> Jul 10, 2023  9:52 AM Jeffery Sweeney wrote: Red Word that prompted transfer to Nurse Triage: Patient states when he sneezes or coughs he's feels a sharp pain in his ribs and he believes it may have to do wit gallstones Reason for Disposition  [1] MODERATE pain (e.g., interferes with normal activities) AND [2] pain comes and goes (cramps) AND [3] present > 24 hours  (Exception: Pain with Vomiting or Diarrhea - see that Guideline.)  Answer Assessment - Initial Assessment Questions 1. LOCATION: "Where does it hurt?"      Right side under ribs 2. RADIATION: "Does the pain shoot anywhere else?" (e.g., chest, back)     None 3. ONSET: "When did the pain begin?" (Minutes, hours or days ago)      About a week 4. SUDDEN: "Gradual or sudden onset?"     Sudden  5. PATTERN "Does the pain come and go, or is it constant?"    - If it comes and goes: "How long does it last?" "Do you have pain now?"     (Note: Comes and goes means the pain is intermittent. It goes away completely between bouts.)    - If constant: "Is it getting better,  staying the same, or getting worse?"      (Note: Constant means the pain never goes away completely; most serious pain is constant and gets worse.)      Comes and goes with coughing/sneezing 6. SEVERITY: "How bad is the pain?"  (e.g., Scale 1-10; mild, moderate, or severe)    - MILD (1-3): Doesn't interfere with normal activities, abdomen soft and not tender to touch.     - MODERATE (4-7): Interferes with normal activities or awakens from sleep, abdomen tender to touch.     - SEVERE (8-10): Excruciating pain, doubled over, unable to do any normal activities.       Sharp shooting pain with cough/sneezing 8/10  8. CAUSE: "What do you think is causing the stomach pain?"     Pt reports he was recently dx with gallstones  10. OTHER SYMPTOMS: "Do you have any other symptoms?" (e.g., back pain, diarrhea, fever, urination pain, vomiting)       Diarrhea, abd cramping  Protocols used: Abdominal Pain - Male-A-AH

## 2023-07-10 NOTE — Progress Notes (Signed)
Acute Care Office Visit  Subjective:   Jeffery Sweeney [facility] 23-Mar-1961 07/10/2023  ABDOMINAL PAIN: Onset: acute, x 7 days  Location:  R upper quadrant/ epigastric region  Description of pain: feels like a stabbing sensation from an ice pick Radiation: no  Severity: 8/10 Alleviating factors: Pain occurs when he coughing, sneezing, twisting  Aggravating factors: just sitting, the pain is not there  Reports urinary urgency, diarrhea, & weight loss.   Denies fever/chills, change in appetite, changes in bowel habits, dysuria, hematuria, syncopal episodes, intractable vomiting, and recent antibiotic use. Denies pain associated with eating or food aversion.   Symptoms:  Rash: no  Weight loss: yes Decreased appetite: yes Melena/Hematochezia: no  BRBPR: no Heartburn: no Hematemesis: no  Recurrent NSAID use: no EtOH use: no   Review of chart- pt went to [facility] for [facility]Q and generalized abd pain Now he is reporting today RUQ and epigastric pain with increase intra-abd presure  CT ABDOMEN PELVIS W CONTRAST: Hepatobiliary: Layering gallstones within the gallbladder. Diffuse low-density throughout the liver compatible with fatty infiltration. No focal abnormality or biliary ductal dilatation.  The following portions of the patient's history were reviewed and updated as appropriate: past medical history, past surgical history, family history, social history, allergies, medications, and problem list.   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   Chest pain 07/06/2020   CAD, multiple vessel 07/06/2020   3-vessel CAD 07/06/2020   Hypertension    Tobacco abuse    Non-ST elevation (NSTEMI) myocardial infarction [facility])    Unstable angina (HCC) 07/05/2020   Past Medical History:  Diagnosis Date   CVA (cerebral vascular accident) (HCC) 06/2021   Small acute/subacute infarcts in the left occipital lobe and   Hyperlipidemia LDL goal <70    started on Repatha 09/26/2021, continue Crestor 40mg  daily   Hypertension    Multiple vessel coronary artery disease    Myocardial infarct (HCC)    x 3; PCI with single stent placement for each (in [place])   NSTEMI (non-ST elevated myocardial infarction) (HCC)    LHC 2/18 with multivessel CAD and transfer to [facility] for CABG eval   STEMI (ST elevation myocardial infarction) (HCC) 09/23/2021   LHC 09/23/2021 x 2   Tobacco use    2 cigarettes daily   Unstable angina (HCC)    LHC 09/23/2021 x 2; total 8 DES to LIMA   Past Surgical History:  Procedure Laterality Date   CORONARY ANGIOPLASTY WITH STENT PLACEMENT     in [place] in 2018-19   CORONARY ARTERY BYPASS GRAFT N/A 07/09/2020   Procedure: CORONARY ARTERY BYPASS GRAFTING (CABG) x THREE , USING LEFT INTERNAL MAMMARY ARTERY, AND RIGHT LEG GREATER SAPHENOUS VEIN HARVESTED ENDOSCOPICALLY;  Surgeon: Delight Ovens, MD;  Location: [facility] OR;  Service: Open Heart Surgery;  Laterality: N/A;   CORONARY PRESSURE/FFR STUDY N/A 07/06/2020   Procedure: INTRAVASCULAR PRESSURE WIRE/FFR STUDY;  Surgeon: Iran Ouch, MD;  Location: [facility] INVASIVE CV LAB;  Service: Cardiovascular;  Laterality: N/A;  LAD   CORONARY  STENT INTERVENTION N/A 09/23/2021   Procedure: CORONARY STENT INTERVENTION;  Surgeon: Iran Ouch, MD;  Location: [facility] INVASIVE CV LAB;  Service: Cardiovascular;  Laterality: N/A;   CORONARY/GRAFT ACUTE MI REVASCULARIZATION N/A 09/23/2021   Procedure: Coronary/Graft Acute MI Revascularization;  Surgeon: Iran Ouch, MD;   Location: [facility] INVASIVE CV LAB;  Service: Cardiovascular;  Laterality: N/A;   LEFT HEART CATH AND CORONARY ANGIOGRAPHY N/A 07/06/2020   Procedure: LEFT HEART CATH AND CORONARY ANGIOGRAPHY;  Surgeon: Iran Ouch, MD;  Location: [facility] INVASIVE CV LAB;  Service: Cardiovascular;  Laterality: N/A;   LEFT HEART CATH AND CORONARY ANGIOGRAPHY N/A 09/23/2021   Procedure: LEFT HEART CATH AND CORONARY ANGIOGRAPHY;  Surgeon: Iran Ouch, MD;  Location: [facility] INVASIVE CV LAB;  Service: Cardiovascular;  Laterality: N/A;   LEFT HEART CATH AND CORS/GRAFTS ANGIOGRAPHY N/A 09/23/2021   Procedure: LEFT HEART CATH AND CORS/GRAFTS ANGIOGRAPHY;  Surgeon: Iran Ouch, MD;  Location: [facility] INVASIVE CV LAB;  Service: Cardiovascular;  Laterality: N/A;   TEE WITHOUT CARDIOVERSION N/A 07/09/2020   Procedure: TRANSESOPHAGEAL ECHOCARDIOGRAM (TEE);  Surgeon: Delight Ovens, MD;  Location[facility] OR;  Service: Open Heart Surgery;  Laterality: N/A;   WRIST SURGERY Left    for cyst   Family History  Problem Relation Age of Onset   Diabetes Mellitus II Father    Outpatient Medications Prior to Visit  Medication Sig Dispense Refill   albuterol (VENTOLIN HFA) 108 (90 Base) MCG/ACT inhaler Inhale 2 puffs into the lungs every 6 (six) hours as needed for wheezing or shortness of breath. 8 g 6   aspirin 81 MG chewable tablet Chew 1 tablet (81 mg total) by mouth daily.     calcium carbonate (TUMS - DOSED IN MG ELEMENTAL CALCIUM) 500 MG chewable tablet Chew 1 tablet (200 mg of elemental calcium total) by mouth 2 (two) times daily as needed for heartburn. (Patient not taking: Reported on 07/10/2023)     cetirizine (ZYRTEC ALLERGY) 10 MG tablet Take 1 tablet (10 mg total) by mouth daily. 30 tablet 2   clopidogrel (PLAVIX) 75 MG tablet Take 1 tablet (75 mg total) by mouth daily. 30 tablet 3   Dulaglutide (TRULICITY) 0.75 MG/0.5ML SOAJ Inject 0.75 mg into the skin once a week. (Patient not taking: Reported on 07/10/2023) 2 mL 2    Evolocumab (REPATHA SURECLICK) 140 MG/ML SOAJ Inject 140 mg into the skin every 14 (fourteen) days. (Patient not taking: Reported on 07/10/2023) 2 mL 3   isosorbide mononitrate (IMDUR) 60 MG 24 hr tablet Take 1 tablet (60 mg total) by mouth 2 (two) times daily. 180 tablet 0   meclizine (ANTIVERT) 25 MG tablet Take 1 tablet (25 mg total) by mouth 3 (three) times daily as needed. 30 tablet 0   metFORMIN (GLUCOPHAGE) 1000 MG tablet Take 1 tablet (1,000 mg total) by mouth 2 (two) times daily with a meal. 60 tablet 3   metoprolol tartrate (LOPRESSOR) 50 MG tablet Take 1 tablet (50 mg total) by mouth 2 (two) times daily. 60 tablet 3   nitroGLYCERIN (NITROSTAT) 0.4 MG SL tablet Place 1 tablet (0.4 mg total) under the tongue every 5 (five) minutes as needed for chest pain. 25 tablet 3   pantoprazole (PROTONIX) 40 MG tablet Take 1 tablet (40 mg total) by mouth daily. PLEASE CALL OFFICE TO SCHEDULE APPOINTMENT PRIOR TO NEXT REFILL 90 tablet 0   rosuvastatin (CRESTOR) 40 MG tablet Take 1 tablet by mouth once daily 30 tablet 5  No facility-administered medications prior to visit.   No Known Allergies  ROS: A complete ROS was performed with pertinent positives/negatives noted in the HPI. The remainder of the ROS are negative.    Objective:   Today's Vitals   07/10/23 1036  BP: 135/89  Pulse: 82  Resp: 18  Temp: (!) 97.5 F (36.4 C)  TempSrc: Oral  SpO2: 95%  Weight: 173 lb (78.5 kg)  Height: 5\' 3"  (1.6 m)  PainSc: 0-No pain   GENERAL: Well-appearing, in NAD. Well nourished.  SKIN: Pink, warm and dry. No rash, lesion, ulceration, or ecchymoses.  Head: Normocephalic. NECK: Trachea midline. Full ROM w/o pain or tenderness. No lymphadenopathy.  EARS: Tympanic membranes are intact, translucent without bulging and without drainage. Appropriate landmarks visualized.  EYES: Conjunctiva clear without exudates. EOMI, PERRL, no drainage present.  NOSE: Septum midline w/o deformity. Nares patent, mucosa  pink and non-inflamed w/o drainage. No sinus tenderness.  THROAT: Uvula midline. Oropharynx clear. Tonsils non-inflamed without exudate. Mucous membranes pink and moist.  RESPIRATORY: Chest wall symmetrical. Respirations even and non-labored. Breath sounds clear to auscultation bilaterally.  CARDIAC: S1, S2 present, regular rate and rhythm without murmur or gallops. Peripheral pulses 2+ bilaterally.  MSK: Muscle tone and strength appropriate for age. Joints w/o tenderness, redness, or swelling.  EXTREMITIES: Without clubbing, cyanosis, or edema.  NEUROLOGIC: No motor or sensory deficits. Steady, even gait. C2-C12 intact.  PSYCH/MENTAL STATUS: Alert, oriented x 3. Cooperative, appropriate mood and affect.     Assessment & Plan:   1. Right upper quadrant abdominal tenderness without rebound tenderness (Primary) Patient is well-appearing and in no acute distress. Physical exam reveals tenderness present in the RUQ of the abdomen and underneath the right portion of his rib cage. He also notes some epigastric pain. No rebound tenderness or guarding present- less concerned for cholecystitis and peritonitis.  Vital signs reviewed- less concerned for acute infection or sepsis. Will obtain labs to ensure no involvement of liver, pancreas, and kidneys. Will also obtain CBC to rule out infection. Due to CT scan showing gallstones without cholecystitis, feel it would be reasonable to place referral to GI at this time. Patient is agreeable to plan of care. Counseled regarding ED precautions and when to return to office.   - Amylase - CBC with Differential/Platelet - Comprehensive metabolic panel - Lipase  2. Calculus of gallbladder and bile duct without cholecystitis or obstruction See #1 - Ambulatory referral to Gastroenterology   Lab Orders         Amylase         CBC with Differential/Platelet         Comprehensive metabolic panel         Lipase       Return in about 2 weeks (around 07/24/2023)  for abdominal pain.    Patient to reach out to office if new, worrisome, or unresolved symptoms arise or if no improvement in patient's condition. Patient verbalized understanding and is agreeable to treatment plan. All questions answered to patient's satisfaction.    Alyson Reedy, FNP

## 2023-07-10 NOTE — Progress Notes (Addendum)
07/10/2023 Name: Jeffery Sweeney MRN: [id number] DOB: [birth year]  Chief Complaint  Patient presents with   Medication Assistance    Jeffery Sweeney is a 63 y.o. year old male who presented for a telephone visit.   They were referred to the pharmacist by their PCP for assistance in managing medication access.    Subjective:  Care Team: Primary Care Provider: Alyson Reedy, FNP ; Next Scheduled Visit: 09/14/2023 Cardiologist: Jeffery Ouch, MD  Medication Access/Adherence  Current [place]:  [facility] [address]),  - [address] [place]) [place] [address] Phone: [phone] Fax: [phone]  [facility] [place] [address] [place] [address] Phone: [phone] Fax: [phone]   Patient reports affordability concerns with their medications: Yes  Patient reports access/transportation concerns to their [facility]: No  Patient reports adherence concerns with their medications:  No    During our conversation, patient reports recently has gallstones  Diabetes:  Current medications:  - metformin 1000 mg twice daily with meals (started ~06/16/2023) - Trulicity 0.75 mg weekly - Reports not started due to cost  Denies currently checking home blood sugar as does not have a glucometer   Hypertension:  Current medications:  - metoprolol 50 mg twice daily - isosorbide ER 60 mg twice daily  Patient has an automated, upper arm home BP cuff Reports has not checked in days; not currently recording readings  Patient denies hypotensive s/sx including dizziness, lightheadedness. 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; history of stroke   Objective:  Lab Results  Component Value Date   HGBA1C 11.6 (H) 06/16/2023    Lab Results  Component Value Date   CREATININE 1.03 06/23/2023   BUN 19 06/23/2023   NA 135 06/23/2023   K 3.7 06/23/2023   CL 100 06/23/2023   CO2 21 (L) 06/23/2023    Lab Results  Component Value Date   CHOL 143 06/16/2023   HDL 32 (L) 06/16/2023   LDLCALC 75 06/16/2023   TRIG 217 (H) 06/16/2023   CHOLHDL 4.5 06/16/2023   BP Readings from Last 3 Encounters:  07/10/23 135/89  06/24/23 111/79  06/23/23 116/84   Pulse Readings from Last 3 Encounters:  07/10/23 82  06/24/23 88  06/23/23 67     Medications Reviewed Today     Reviewed by Jeffery Sweeney, RPH-CPP (Pharmacist) on 07/10/23 at 1240  Med List Status: <None>   Medication Order Taking? Sig Documenting Provider Last Dose Status Informant  albuterol (VENTOLIN HFA) 108 (90 Base) MCG/ACT inhaler [id number] Yes Inhale 2 puffs into the lungs every 6 (six) hours as needed for wheezing or shortness of breath. Jeffery Reedy, FNP Taking Active   aspirin 81 MG chewable tablet [id number] Yes Chew 1 tablet (81 mg total) by mouth daily. Jeffery Quest, NP Taking Active   calcium carbonate (TUMS - DOSED IN MG ELEMENTAL CALCIUM) 500 MG chewable tablet [id number] No Chew 1 tablet (200 mg of elemental calcium total) by mouth 2 (two) times daily as needed for heartburn.  Patient not taking: Reported on 07/10/2023  Jeffery Ito, MD Not Taking Active   cetirizine (ZYRTEC ALLERGY) 10 MG tablet [id number] Yes Take 1 tablet (10 mg total) by mouth daily. Jeffery Boroughs B, FNP Taking Active   clopidogrel (PLAVIX) 75 MG tablet [id number] Yes Take 1 tablet (75 mg total) by mouth daily. Jeffery Ouch, MD Taking Active   Patient not taking:  Discontinued 07/10/23 1240    Evolocumab (REPATHA SURECLICK) 140 MG/ML SOAJ [id number] No Inject 140 mg into the skin every 14 (fourteen) days.  Patient not taking: Reported on 07/10/2023   Jeffery Reedy, FNP Not Taking Active   isosorbide mononitrate (IMDUR) 60 MG 24 hr tablet [id number] Yes Take 1 tablet (60 mg total) by mouth 2 (two) times daily. Jeffery Odea, MD Taking Active   meclizine (ANTIVERT) 25 MG tablet [id number] Yes Take 1 tablet (25 mg total) by mouth 3 (three) times daily as needed. Jeffery Antis, MD Taking Active   metFORMIN (GLUCOPHAGE) 1000 MG tablet [id number] Yes Take 1 tablet (1,000 mg total) by mouth 2 (two) times daily with a meal. Jeffery Reedy, FNP Taking Active   metoprolol tartrate (LOPRESSOR) 50 MG tablet [id number] Yes Take 1 tablet (50 mg total) by mouth 2 (two) times daily. Jeffery Ouch, MD Taking Active   nitroGLYCERIN (NITROSTAT) 0.4 MG SL tablet [id number]  Place 1 tablet (0.4 mg total) under the tongue every 5 (five) minutes as needed for chest pain. Jeffery Quest, NP  Active   pantoprazole (PROTONIX) 40 MG tablet [id number] Yes Take 1 tablet (40 mg total) by mouth daily. PLEASE CALL OFFICE TO SCHEDULE APPOINTMENT PRIOR TO NEXT REFILL Jeffery Ouch, MD Taking Active   rosuvastatin (CRESTOR) 40 MG tablet [id number] Yes Take 1 tablet by mouth once daily Jeffery Ouch, MD Taking Active               Assessment/Plan:   Comprehensive medication review performed; medication list updated in electronic medical record - Identify patient in need of refill of isosorbide. Plans to pick up from Jeffery Sweeney today  Based on uninsured status and reported income, recommend patient contact Jeffery Sweeney today to see if he qualifies for assistance through [facility] - Follow up with patient and he confirms reached Jeffery Sweeney, discussed assistance program and plans to follow up in person on Monday to complete  paperwork  Encourage patient to follow up with PCP regarding ongoing stomach discomfort/nausea  Diabetes: - Currently uncontrolled - Send message to PCP to collaborate regarding diabetes medication management for patient Given recent cholelithiasis, recommend to discontinue Trulicity/avoid use of GLP-1 receptor agonist due to association of gallbladder disease with GLP-1 receptor agonists - Reviewed long term cardiovascular and renal outcomes of uncontrolled blood sugar - Reviewed dietary modifications including importance of having regular well-balanced meals and snacks throughout the day, while controlling carbohydrate portion sizes  Patient to review nutrition labels for total carbohydrate content of foods - Counsel patient to take metformin doses with meals to aid with tolerability - Recommend patient to obtain blood sugar monitor and supplies from S[facility]. Recommend to start to check glucose, keep log of results and have this record to review during our next appointment   Hypertension: - Counsel on impact of salt/sodium on blood pressure  - Reviewed appropriate blood pressure monitoring technique and reviewed goal blood pressure. - Recommend to monitor home blood pressure, keep log of results and have this record to review at upcoming medical appointments. Patient to contact provider office sooner if needed for  readings outside of established parameters or symptoms   Hyperlipidemia/ASCVD Risk Reduction: - Reviewed long term complications of uncontrolled cholesterol - Reviewed dietary recommendations including limiting saturated and trans fats in diet - Recommend to follow up with [facility] for assistance with obtaining Repatha    Follow Up Plan: Clinical Pharmacist will follow up with patient by telephone on 07/15/2023 at 1:00 PM   Estelle Grumbles, PharmD, [facility] [phone]

## 2023-07-10 NOTE — Patient Instructions (Signed)
Goals Addressed             This Visit's Progress    Pharmacy Goals       Please follow up with [facility] to see if you can enroll in the medication management program  [facility] [address] [place], [place] [address] [phone]  The goal A1c is less than 7%. This is the best way to reduce the risk of the long term complications of diabetes, including heart disease, kidney disease, eye disease, strokes, and nerve damage. An A1c of less than 7% corresponds with fasting sugars less than 130 and 2 hour after meal sugars less than 180. Please pick up a blood sugar monitor from the pharmacy and start to check your blood sugar at home.  Check your blood pressure twice weekly, and any time you have concerning symptoms like headache, chest pain, dizziness, shortness of breath, or vision changes.   Our goal is less than 130/80.  To appropriately check your blood pressure, make sure you do the following:  1) Avoid caffeine, exercise, or tobacco products for 30 minutes before checking. Empty your bladder. 2) Sit with your back supported in a flat-backed chair. Rest your arm on something flat (arm of the chair, table, etc). 3) Sit still with your feet flat on the floor, resting, for at least 5 minutes.  4) Check your blood pressure. Take 1-2 readings.  5) Write down these readings and bring with you to any provider appointments.  Bring your home blood pressure machine with you to a provider's office for accuracy comparison at least once a year.   Make sure you take your blood pressure medications before you come to any office visit, even if you were asked to fast for labs.  Estelle Grumbles, PharmD, [facility] [phone]

## 2023-07-10 NOTE — Patient Instructions (Addendum)
Salonpas  Voltaren gel  Advil as needed, do not take more than 3000mg  in 24 hours  If you experience severe abdominal pain with sitting up or walking, nausea/vomiting, fever/chills, pain with eating, worsening of symptoms- please either go to urgent care or emergency department  Please let me know if this does not improve I am also placing a referral to gastroenterology (GI)  MyChart:  For all urgent or time sensitive needs we ask that you please call the office to avoid delays. Our number is [phone]. MyChart is not constantly monitored and due to the large volume of messages a day, replies may take up to 72 business hours.   MyChart Policy: MyChart allows for you to see your visit notes, after visit summary, provider recommendations, lab and tests results, make an appointment, request refills, and contact your provider or the office for non-urgent questions or concerns. Providers are seeing patients during normal business hours and do not have built in time to review MyChart messages.  We ask that you allow a minimum of 3 business days for responses to [employer]. For this reason, please do not send urgent requests through MyChart. Please call the office at [phone]. New and ongoing conditions may require a visit. We have virtual and in person visit available for your convenience.  Complex MyChart concerns may require a visit. Your provider may request you schedule a virtual or in person visit to ensure we are providing the best care possible. MyChart messages sent after 11:00 AM on Friday will not be received by the provider until Monday morning.    Lab and Test Results: You will receive your lab and test results on MyChart as soon as they are completed and results have been sent by the lab or testing facility. Due to this service, you will receive your results BEFORE your provider.  I review lab and tests results each morning prior to seeing patients. Some results require  collaboration with other providers to ensure you are receiving the most appropriate care. For this reason, we ask that you please allow a minimum of 3-5 business days from the time the ALL results have been received for your provider to receive and review lab and test results and contact you about these.  Most lab and test result comments from the provider will be sent through MyChart. Your provider may recommend changes to the plan of care, follow-up visits, repeat testing, ask questions, or request an office visit to discuss these results. You may reply directly to this message or call the office at [phone] to provide information for the provider or set up an appointment. In some instances, you will be called with test results and recommendations. Please let us know if this is preferred and we will make note of this in your chart to provide this for you.    If you have not heard a response to your lab or test results in 5 business days from all results returning to MyChart, please call the office to let us know. We ask that you please avoid calling prior to this time unless there is an emergent concern. Due to high call volumes, this can delay the resulting process.   After Hours: For all non-emergency after hours needs, please call the office at [phone] and select the option to reach the on-call provider service. On-call services are shared between multiple [place] offices and therefore it will not be possible to speak directly with your provider. On-call providers may  provide medical advice and recommendations, but are unable to provide refills for maintenance medications.  For all emergency or urgent medical needs after normal business hours, we recommend that you seek care at the closest Urgent Care or Emergency Department to ensure appropriate treatment in a timely manner.  [facility] [place] has a 24 hour emergency room located on the ground floor for your convenience.     Urgent Concerns During the Business Day Providers are seeing patients from 8AM to 5PM, Monday through Thursday, and 8AM to 12PM on Friday with a busy schedule and are most often not able to respond to non-urgent calls until the end of the day or the next business day. If you should have URGENT concerns during the day, please call and speak to the nurse or schedule a same day appointment so that we can address your concern without delay.    Thank you, again, for choosing me as your health care partner. I appreciate your trust and look forward to learning more about you.    Alyson Reedy, FNP-C

## 2023-07-11 LAB — CBC WITH DIFFERENTIAL/PLATELET
Basophils Absolute: 0.1 10*3/uL (ref 0.0–0.2)
Basos: 1 %
EOS (ABSOLUTE): 0.4 10*3/uL (ref 0.0–0.4)
Eos: 4 %
Hematocrit: 49.7 % (ref 37.5–51.0)
Hemoglobin: 16.8 g/dL (ref 13.0–17.7)
Immature Grans (Abs): 0 10*3/uL (ref 0.0–0.1)
Immature Granulocytes: 0 %
Lymphocytes Absolute: 4.4 10*3/uL — ABNORMAL HIGH (ref 0.7–3.1)
Lymphs: 44 %
MCH: 29.5 pg (ref 26.6–33.0)
MCHC: 33.8 g/dL (ref 31.5–35.7)
MCV: 87 fL (ref 79–97)
Monocytes Absolute: 0.6 10*3/uL (ref 0.1–0.9)
Monocytes: 7 %
Neutrophils Absolute: 4.2 10*3/uL (ref 1.4–7.0)
Neutrophils: 44 %
Platelets: 262 10*3/uL (ref 150–450)
RBC: 5.7 x10E6/uL (ref 4.14–5.80)
RDW: 12.9 % (ref 11.6–15.4)
WBC: 9.7 10*3/uL (ref 3.4–10.8)

## 2023-07-11 LAB — COMPREHENSIVE METABOLIC PANEL
ALT: 15 [IU]/L (ref 0–44)
AST: 17 [IU]/L (ref 0–40)
Albumin: 4.3 g/dL (ref 3.9–4.9)
Alkaline Phosphatase: 76 [IU]/L (ref 44–121)
BUN/Creatinine Ratio: 7 — ABNORMAL LOW (ref 10–24)
BUN: 8 mg/dL (ref 8–27)
Bilirubin Total: 0.9 mg/dL (ref 0.0–1.2)
CO2: 22 mmol/L (ref 20–29)
Calcium: 10 mg/dL (ref 8.6–10.2)
Chloride: 103 mmol/L (ref 96–106)
Creatinine, Ser: 1.1 mg/dL (ref 0.76–1.27)
Globulin, Total: 2.7 g/dL (ref 1.5–4.5)
Glucose: 90 mg/dL (ref 70–99)
Potassium: 4.2 mmol/L (ref 3.5–5.2)
Sodium: 140 mmol/L (ref 134–144)
Total Protein: 7 g/dL (ref 6.0–8.5)
eGFR: 75 mL/min/{1.73_m2} (ref >59)

## 2023-07-11 LAB — AMYLASE: Amylase: 24 U/L — ABNORMAL LOW (ref 31–110)

## 2023-07-11 LAB — LIPASE: Lipase: 18 U/L (ref 13–78)

## 2023-07-12 ENCOUNTER — Other Ambulatory Visit: Payer: Self-pay

## 2023-07-13 ENCOUNTER — Other Ambulatory Visit: Payer: Self-pay | Admitting: Family Medicine

## 2023-07-13 ENCOUNTER — Other Ambulatory Visit: Payer: Self-pay

## 2023-07-13 MED ORDER — MECLIZINE HCL 25 MG PO TABS
25.0000 mg | ORAL_TABLET | Freq: Three times a day (TID) | ORAL | 2 refills | Status: DC | PRN
  Filled 2023-07-13: qty 30, 10d supply, fill #0

## 2023-07-14 ENCOUNTER — Other Ambulatory Visit: Payer: Self-pay | Admitting: Family Medicine

## 2023-07-15 ENCOUNTER — Other Ambulatory Visit: Payer: Self-pay | Admitting: Pharmacist

## 2023-07-15 ENCOUNTER — Telehealth: Payer: Self-pay | Admitting: Pharmacist

## 2023-07-15 NOTE — Progress Notes (Signed)
   Outreach Note  07/15/2023 Name: Maclovio Henson MRN: [id number] DOB: 1960-11-16  Referred by: Alyson Reedy, FNP Reason for referral : Medication Assistance, Medication Management, and Medication Adherence   Was unable to reach patient via telephone today and have left HIPAA compliant voicemail asking patient to return my call.   Follow Up Plan: Will collaborate with Care Guide to outreach to schedule follow up with me  Estelle Grumbles, PharmD, [facility] [phone]

## 2023-07-16 ENCOUNTER — Other Ambulatory Visit: Payer: Self-pay

## 2023-07-16 ENCOUNTER — Other Ambulatory Visit: Payer: Self-pay | Admitting: Cardiovascular Disease

## 2023-07-16 MED FILL — Pantoprazole Sodium EC Tab 40 MG (Base Equiv): ORAL | 30 days supply | Qty: 30 | Fill #0 | Status: AC

## 2023-07-17 ENCOUNTER — Other Ambulatory Visit: Payer: Self-pay

## 2023-07-19 ENCOUNTER — Other Ambulatory Visit: Payer: Self-pay | Admitting: Cardiovascular Disease

## 2023-07-24 ENCOUNTER — Ambulatory Visit: Payer: Self-pay | Admitting: Family Medicine

## 2023-07-28 ENCOUNTER — Other Ambulatory Visit: Payer: Self-pay

## 2023-08-10 ENCOUNTER — Other Ambulatory Visit: Payer: Self-pay

## 2023-08-10 MED FILL — Rosuvastatin Calcium Tab 40 MG: ORAL | 30 days supply | Qty: 30 | Fill #0 | Status: AC

## 2023-08-19 ENCOUNTER — Other Ambulatory Visit: Payer: Self-pay

## 2023-08-19 ENCOUNTER — Other Ambulatory Visit: Payer: Self-pay | Admitting: Cardiovascular Disease

## 2023-08-19 MED ORDER — CLOPIDOGREL BISULFATE 75 MG PO TABS
75.0000 mg | ORAL_TABLET | Freq: Every day | ORAL | 5 refills | Status: DC
  Filled 2023-08-19: qty 30, 30d supply, fill #0
  Filled 2023-09-16: qty 30, 30d supply, fill #1
  Filled 2023-10-14: qty 30, 30d supply, fill #2
  Filled 2023-11-16: qty 30, 30d supply, fill #3
  Filled 2023-12-14: qty 30, 30d supply, fill #4
  Filled 2024-01-14: qty 30, 30d supply, fill #5

## 2023-08-19 MED ORDER — METOPROLOL TARTRATE 50 MG PO TABS
50.0000 mg | ORAL_TABLET | Freq: Two times a day (BID) | ORAL | 5 refills | Status: DC
  Filled 2023-08-19: qty 60, 30d supply, fill #0
  Filled 2023-09-16: qty 60, 30d supply, fill #1
  Filled 2023-10-21: qty 60, 30d supply, fill #2
  Filled 2023-11-23: qty 60, 30d supply, fill #3
  Filled 2023-12-16 (×2): qty 60, 30d supply, fill #4
  Filled 2024-01-22: qty 60, 30d supply, fill #5

## 2023-08-19 MED FILL — Pantoprazole Sodium EC Tab 40 MG (Base Equiv): ORAL | 30 days supply | Qty: 30 | Fill #1 | Status: AC

## 2023-08-19 MED FILL — Pantoprazole Sodium EC Tab 40 MG (Base Equiv): ORAL | 30 days supply | Qty: 30 | Fill #1 | Status: CN

## 2023-08-20 ENCOUNTER — Other Ambulatory Visit: Payer: Self-pay

## 2023-08-26 ENCOUNTER — Other Ambulatory Visit: Payer: Self-pay | Admitting: Pharmacist

## 2023-08-26 ENCOUNTER — Telehealth: Payer: Self-pay | Admitting: Pharmacist

## 2023-08-26 NOTE — Progress Notes (Unsigned)
   Outreach Note  08/26/2023 Name: Faith Branan MRN: [id number] DOB: [birth year]  Referred by: Alyson Reedy, FNP  Was unable to reach patient via telephone today and have left HIPAA compliant voicemail asking patient to return my call. Outreach attempt #2.   Follow Up Plan: Will collaborate with Care Guide to outreach to schedule follow up with me  Estelle Grumbles, PharmD, [facility] [phone]

## 2023-09-09 ENCOUNTER — Other Ambulatory Visit: Payer: Self-pay | Admitting: Cardiovascular Disease

## 2023-09-09 ENCOUNTER — Other Ambulatory Visit: Payer: Self-pay

## 2023-09-09 MED ORDER — ROSUVASTATIN CALCIUM 40 MG PO TABS
40.0000 mg | ORAL_TABLET | Freq: Every day | ORAL | 6 refills | Status: DC
  Filled 2023-09-09: qty 30, 30d supply, fill #0
  Filled 2023-10-14: qty 30, 30d supply, fill #1
  Filled 2023-11-16: qty 30, 30d supply, fill #2
  Filled 2023-12-14: qty 30, 30d supply, fill #3
  Filled 2024-01-14: qty 30, 30d supply, fill #4
  Filled 2024-02-15: qty 30, 30d supply, fill #5

## 2023-09-10 ENCOUNTER — Other Ambulatory Visit: Payer: Self-pay

## 2023-09-14 ENCOUNTER — Ambulatory Visit: Payer: Self-pay | Admitting: Family Medicine

## 2023-09-14 NOTE — Progress Notes (Deleted)
   Established Patient Office Visit  Subjective  Patient ID: Jeffery Sweeney, male    DOB: [birth year]  Age: 63 y.o. MRN: [id number]  No chief complaint on file.   DIABETES: Jeffery Sweeney is a 63 year old male patient who presents for the medical management of diabetes.  Medication compliance: metformin  1000mg  BID  Denies chest pain, shortness of breath, vision changes, polydipsia, polyphagia, polyuria, open wounds/ulcers on feet.  Denies hypoglycemia. *** Patient is *** adhering to a diabetic diet.  Patient is *** exercising regularly.  Pertinent lab work: A1C: 11.6% A1c last checked on 06/16/2023- will recheck today   Monitoring: blood sugar readings at home: ***          Continue current medication regimen: ***  Well controlled: ***  Follow-up: ***  Lab Results  Component Value Date   HGBA1C 11.6 (H) 06/16/2023    No foot exam found Lab Results  Component Value Date   LABMICR 67.9 06/24/2023    Wt Readings from Last 3 Encounters:  07/10/23 173 lb (78.5 kg)  06/24/23 178 lb (80.7 kg)  06/23/23 177 lb 11.1 oz (80.6 kg)   ROS    Objective:     There were no vitals taken for this visit. BP Readings from Last 3 Encounters:  07/10/23 135/89  06/24/23 111/79  06/23/23 116/84     Physical Exam    Assessment & Plan:  Uncontrolled type 2 diabetes mellitus with hyperglycemia (HCC)     No follow-ups on file.    Wilhelmena Hanson, FNP

## 2023-09-16 ENCOUNTER — Other Ambulatory Visit: Payer: Self-pay | Admitting: Family Medicine

## 2023-09-16 ENCOUNTER — Other Ambulatory Visit: Payer: Self-pay

## 2023-09-16 MED FILL — Pantoprazole Sodium EC Tab 40 MG (Base Equiv): ORAL | 30 days supply | Qty: 30 | Fill #2 | Status: AC

## 2023-09-21 ENCOUNTER — Other Ambulatory Visit: Payer: Self-pay

## 2023-09-22 ENCOUNTER — Other Ambulatory Visit: Payer: Self-pay | Admitting: Cardiology

## 2023-09-22 ENCOUNTER — Other Ambulatory Visit: Payer: Self-pay

## 2023-09-22 MED ORDER — ISOSORBIDE MONONITRATE ER 60 MG PO TB24
60.0000 mg | ORAL_TABLET | Freq: Two times a day (BID) | ORAL | 0 refills | Status: DC
  Filled 2023-09-24: qty 60, 30d supply, fill #0

## 2023-09-24 ENCOUNTER — Other Ambulatory Visit: Payer: Self-pay

## 2023-09-29 ENCOUNTER — Telehealth: Payer: Self-pay

## 2023-09-29 NOTE — Progress Notes (Signed)
 Complex Care Management Care Guide Note  09/29/2023 Name: Jeffery Sweeney MRN: [id number] DOB: 22-Sep-1960  Jeffery Sweeney is a 63 y.o. year old male who is a primary care patient of Jeffery Hanson, FNP and is actively engaged with the care management team. I reached out to Jeffery Sweeney by phone today to assist with re-scheduling  with the Pharmacist.  Follow up plan: Unsuccessful telephone outreach attempt made. A HIPAA compliant phone message was left for the patient providing contact information and requesting a return call.  Jeffery Sweeney , RMA     [facility]  [facility], [facility] Guide  Direct Dial: [phone]  Website: [email or web address]

## 2023-10-14 ENCOUNTER — Other Ambulatory Visit: Payer: Self-pay | Admitting: Family Medicine

## 2023-10-14 ENCOUNTER — Other Ambulatory Visit: Payer: Self-pay

## 2023-10-14 DIAGNOSIS — E1165 Type 2 diabetes mellitus with hyperglycemia: Secondary | ICD-10-CM

## 2023-10-14 MED ORDER — METFORMIN HCL 1000 MG PO TABS
1000.0000 mg | ORAL_TABLET | Freq: Two times a day (BID) | ORAL | 0 refills | Status: DC
  Filled 2023-10-14: qty 60, 30d supply, fill #0

## 2023-10-14 NOTE — Telephone Encounter (Signed)
 30 day sent. No further refills without office visit.

## 2023-10-14 NOTE — Progress Notes (Signed)
 Complex Care Management Care Guide Note  10/14/2023 Name: Jeffery Sweeney MRN: [id number] DOB: Jan 05, 1961  Jeffery Sweeney is a 63 y.o. year old male who is a primary care patient of Wilhelmena Hanson, FNP and is actively engaged with the care management team. I reached out to Harriett Limbo by phone today to assist with re-scheduling  with the Pharmacist.  Follow up plan: Unsuccessful telephone outreach attempt made. A HIPAA compliant phone message was left for the patient providing contact information and requesting a return call.  Lenton Rail , RMA     [facility]  [facility], [facility] Guide  Direct Dial: [phone]  Website: [email or web address]

## 2023-10-14 NOTE — Telephone Encounter (Signed)
 Copied from CRM [id number]. Topic: General - Other >> Oct 14, 2023  1:48 PM [name] wrote: Reason for CRM: Patient called checking status of metFORMIN  HCl 1,000 mg Oral 2 times daily with meals.Aaron Aas adv of turn around time.Aaron Aas but wasn't sure if needs to be seen to get the refill

## 2023-10-15 ENCOUNTER — Emergency Department
Admission: EM | Admit: 2023-10-15 | Discharge: 2023-10-15 | Disposition: A | Discharge status: Home / Self Care | Payer: Self-pay

## 2023-10-15 ENCOUNTER — Other Ambulatory Visit: Payer: Self-pay

## 2023-10-15 ENCOUNTER — Ambulatory Visit: Payer: Self-pay

## 2023-10-15 ENCOUNTER — Emergency Department: Payer: Self-pay

## 2023-10-15 DIAGNOSIS — Z951 Presence of aortocoronary bypass graft: Secondary | ICD-10-CM | POA: Insufficient documentation

## 2023-10-15 DIAGNOSIS — R0789 Other chest pain: Secondary | ICD-10-CM | POA: Insufficient documentation

## 2023-10-15 DIAGNOSIS — E119 Type 2 diabetes mellitus without complications: Secondary | ICD-10-CM | POA: Insufficient documentation

## 2023-10-15 DIAGNOSIS — I2510 Atherosclerotic heart disease of native coronary artery without angina pectoris: Secondary | ICD-10-CM | POA: Insufficient documentation

## 2023-10-15 LAB — CBC
HCT: 43 % (ref 39.0–52.0)
Hemoglobin: 14.7 g/dL (ref 13.0–17.0)
MCH: 29.6 pg (ref 26.0–34.0)
MCHC: 34.2 g/dL (ref 30.0–36.0)
MCV: 86.7 fL (ref 80.0–100.0)
Platelets: 248 10*3/uL (ref 150–400)
RBC: 4.96 MIL/uL (ref 4.22–5.81)
RDW: 13.2 % (ref 11.5–15.5)
WBC: 9.2 10*3/uL (ref 4.0–10.5)
nRBC: 0 % (ref 0.0–0.2)

## 2023-10-15 LAB — BASIC METABOLIC PANEL WITH GFR
Anion gap: 8 (ref 5–15)
BUN: 9 mg/dL (ref 8–23)
CO2: 24 mmol/L (ref 22–32)
Calcium: 8.8 mg/dL — ABNORMAL LOW (ref 8.9–10.3)
Chloride: 105 mmol/L (ref 98–111)
Creatinine, Ser: 0.97 mg/dL (ref 0.61–1.24)
GFR, Estimated: >60 mL/min (ref >60)
Glucose, Bld: 90 mg/dL (ref 70–99)
Potassium: 3.7 mmol/L (ref 3.5–5.1)
Sodium: 137 mmol/L (ref 135–145)

## 2023-10-15 LAB — TROPONIN I (HIGH SENSITIVITY)
Troponin I (High Sensitivity): 3 ng/L (ref <18)
Troponin I (High Sensitivity): 3 ng/L (ref <18)

## 2023-10-15 MED ORDER — LIDOCAINE 5 % EX PTCH
1.0000 | MEDICATED_PATCH | CUTANEOUS | Status: DC
  Administered 2023-10-15: 1 via TRANSDERMAL
  Filled 2023-10-15: qty 1

## 2023-10-15 MED ORDER — ACETAMINOPHEN 500 MG PO TABS: 1000.0000 mg | ORAL_TABLET | Freq: Four times a day (QID) | ORAL | 2 refills | Status: DC | PRN

## 2023-10-15 MED ORDER — LIDOCAINE 5 % EX PTCH: 1.0000 | MEDICATED_PATCH | CUTANEOUS | 0 refills | Status: AC

## 2023-10-15 MED ORDER — ACETAMINOPHEN 500 MG PO TABS
1000.0000 mg | ORAL_TABLET | Freq: Once | ORAL | Status: AC
  Administered 2023-10-15: 1000 mg via ORAL
  Filled 2023-10-15: qty 2

## 2023-10-15 NOTE — ED Provider Notes (Signed)
 [facility] Provider Note    Event Date/Time   First MD Initiated Contact with Patient 10/15/23 1828     (approximate)   History   Chest Pain  Patient states pain under left armpit and left arm heaviness that started this morning when he woke up; history of CABG.    HPI Jeffery Sweeney is a 63 y.o. male PMH CAD status post CABG x 3, prior stroke, hyperlipidemia, poorly controlled T2DM presents for evaluation of left armpit discomfort - Patient states when he woke up this morning he had pain in his left lateral chest/axilla that has been ongoing throughout the day.  Has not taken any pain medications.  Worse with positional movement.  No cough, shortness of breath, hemoptysis.  No recent surgery/stasis/travel, leg swelling, history of DVT/PE, hormone use. - No obvious precipitating event.  No preceding trauma. - Not exertional, no radiation   Per chart review, last myocardial perfusion scan in 04/09/2022, unremarkable, results below:   The study is low risk.   No ST deviation was noted.   LV perfusion is normal.   Left ventricular function is normal. calculated LVEF is 54%   Patient is s/p cabg x 3.      Physical Exam   Triage Vital Signs: ED Triage Vitals  Encounter Vitals Group     BP 10/15/23 1815 134/86     Systolic BP Percentile --      Diastolic BP Percentile --      Pulse Rate 10/15/23 1815 83     Resp 10/15/23 1815 18     Temp 10/15/23 1815 97.8 F (36.6 C)     Temp Source 10/15/23 1815 Oral     SpO2 10/15/23 1815 100 %     Weight 10/15/23 1813 179 lb (81.2 kg)     Height 10/15/23 1813 5\' 3"  (1.6 m)     Head Circumference --      Peak Flow --      Pain Score 10/15/23 1812 9     Pain Loc --      Pain Education --      Exclude from Growth Chart --     Most recent vital signs: Vitals:   10/15/23 1840 10/15/23 2248  BP: 121/72 133/87  Pulse: 76 80  Resp: 16 19  Temp:  (!) 97.5 F (36.4 C)  SpO2: 98% 97%      General: Awake, no distress.  CV:  Good peripheral perfusion. RRR, RP 2+. +TTP left lateral chest wall/axilla.  No overlying skin change.  No deformities. + Pain is worse with ranging of left arm Resp:  Normal effort. CTAB Abd:  No distention. Nontender to deep palpation throughout   ED Results / Procedures / Treatments   Labs (all labs ordered are listed, but only abnormal results are displayed) Labs Reviewed  BASIC METABOLIC PANEL WITH GFR - Abnormal; Notable for the following components:      Result Value   Calcium  8.8 (*)    All other components within normal limits  CBC  TROPONIN I (HIGH SENSITIVITY)  TROPONIN I (HIGH SENSITIVITY)     EKG  See ED course below.    RADIOLOGY Radiology interpreted by myself and radiology reports reviewed.  No acute pathology.    PROCEDURES:  Critical Care performed: No  Procedures   MEDICATIONS ORDERED IN ED: Medications  lidocaine  (LIDODERM ) 5 % 1 patch (1 patch Transdermal Patch Applied 10/15/23 1926)  acetaminophen  (TYLENOL ) tablet 1,000 mg (1,000  mg Oral Given 10/15/23 1926)     IMPRESSION / MDM / ASSESSMENT AND PLAN / ED COURSE  I reviewed the triage vital signs and the nursing notes.                              DDX/MDM/AP: Differential diagnosis includes, but is not limited to, likely MSK strain, consider underlying pneumothorax, ACS.  No history to suggest underlying rib fracture.  Highly doubt other etiology of presentation including PE, aortic dissection.   Plan: - EKG - Chest x-ray - Labs - Tylenol , Lidoderm  patch - reassess  Patient's presentation is most consistent with acute presentation with potential threat to life or bodily function.    ED course below.  Workup unremarkable including serial troponins, EKG, chest x-ray.  Presentation overall most consistent with MSK etiology.  Patient prefers discharge home and is comfortable with outpatient follow-up.  Rx Tylenol , Lidoderm  patches.  ED return  precautions in place.  Clinical Course as of 10/15/23 2341  Thu Oct 15, 2023  1850 Ecg = NSR, rate 84, no ST elevation or depression, no significant repolarization abnormality, normal axis, normal intervals.  No evidence of ischemia on my read. [MM]  2012 Trop wnl Cbc, bmp wnl [MM]  2012 CXR: IMPRESSION: No active cardiopulmonary disease.      [MM]  2148 Rpt trop stable [MM]  2244 Patient reevaluated, discussed reassuring workup including serial troponins and chest x-ray.  Notes he has been better after lidocaine  and Tylenol  patch though does still have some positional discomfort in his left axillary region.  Overall most consistent with musculoskeletal strain.  Given multiple comorbidities however, did discuss possible admission for expedited stress testing though patient prefers to follow-up with his PMD/cardiologist which I believe is very reasonable given his more likely musculoskeletal etiology.  Strict ED return precautions in place.  Rx Tylenol , Lidoderm  patches.  Patient agrees with plan. [MM]    Clinical Course User Index [MM] Collis Deaner, MD     FINAL CLINICAL IMPRESSION(S) / ED DIAGNOSES   Final diagnoses:  Chest wall pain  Atypical chest pain     Rx / DC Orders   ED Discharge Orders          Ordered    acetaminophen  (TYLENOL ) 500 MG tablet  Every 6 hours PRN        10/15/23 2246    lidocaine  (LIDODERM ) 5 %  Every 24 hours        10/15/23 2246             Note:  This document was prepared using Dragon voice recognition software and may include unintentional dictation errors.   Collis Deaner, MD 10/15/23 [phone]

## 2023-10-15 NOTE — Discharge Instructions (Addendum)
 Your evaluation in the emergency department was overall reassuring.  Please do follow-up with your primary care provider and cardiologist for reevaluation and ongoing management.  I prescribed you Tylenol  and Lidoderm  patches to use as needed for any ongoing mild discomfort.  Return to the emergency department with any new or worsening symptoms.

## 2023-10-15 NOTE — Telephone Encounter (Signed)
  Chief Complaint: Left sided Chest pain under arm Symptoms: left sided chest pain along with pain when takes a breath in Frequency: started today Pertinent Negatives: Patient denies SOB Disposition: [x] ED /[] Urgent Care (no appt availability in office) / [] Appointment(In office/virtual)/ []  [facility]/ [] Home Care/ [] Refused Recommended Disposition /[] [facility]/ []  Follow-up with PCP Additional Notes: patient calling in with left sided chest pain that is under his arm. Patient endorses pain (9 out of 10) when taking a breath in. Patient with complicated cardiac history. Pain started today and has continued all day. Patient has been constant. Per protocol, patient is recommended to be seen in the ED. Patient verbalized understanding and all questions answered.    Copied from CRM [id number]. Topic: Clinical - Red Word Triage >> Oct 15, 2023  4:31 PM Clyde Darling P wrote: Red Word that prompted transfer to Nurse Triage: Left rib is hurting every time breathe- it hurts Reason for Disposition  Taking a deep breath makes pain worse  Answer Assessment - Initial Assessment Questions 1. LOCATION: "Where does it hurt?"       Left chest pain 2. RADIATION: "Does the pain go anywhere else?" (e.g., into neck, jaw, arms, back)     No radiation 3. ONSET: "When did the chest pain begin?" (Minutes, hours or days)      Started today 4. PATTERN: "Does the pain come and go, or has it been constant since it started?"  "Does it get worse with exertion?"      constant 5. DURATION: "How long does it last" (e.g., seconds, minutes, hours)     Lasting all day 6. SEVERITY: "How bad is the pain?"  (e.g., Scale 1-10; mild, moderate, or severe)    - MILD (1-3): doesn't interfere with normal activities     - MODERATE (4-7): interferes with normal activities or awakens from sleep    - SEVERE (8-10): excruciating pain, unable to do any normal activities       9 out of 10 7. 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 PULMONARY RISK FACTORS: "Do you have any history of lung disease?"  (e.g., blood clots in lung, asthma, emphysema, birth control pills)     bronchitis 9. CAUSE: "What do you think is causing the chest pain?"     unsure 10. OTHER SYMPTOMS: "Do you have any other symptoms?" (e.g., dizziness, nausea, vomiting, sweating, fever, difficulty breathing, cough)       no  Protocols used: Chest Pain-A-AH

## 2023-10-15 NOTE — ED Triage Notes (Signed)
 Patient states pain under left armpit and left arm heaviness that started this morning when he woke up; history of CABG.

## 2023-10-15 NOTE — ED Notes (Signed)
 Pt states he woke up with chest pain today.  Pt reports pain with breathing.  Denies sob.  Pt in left chest radiating into left back and neck.  No n/v  pt took 1 ntg [phone] with some relief.  Pt alert.  Speech clear.  Pt in hallway stretcher.  Hx cabg.

## 2023-10-20 ENCOUNTER — Other Ambulatory Visit: Payer: Self-pay

## 2023-10-20 ENCOUNTER — Encounter: Payer: Self-pay | Admitting: Family Medicine

## 2023-10-20 ENCOUNTER — Ambulatory Visit (INDEPENDENT_AMBULATORY_CARE_PROVIDER_SITE_OTHER): Payer: Self-pay | Admitting: Family Medicine

## 2023-10-20 VITALS — BP 149/89 | HR 58 | Temp 97.5°F | Ht 63.0 in | Wt 183.0 lb

## 2023-10-20 DIAGNOSIS — K219 Gastro-esophageal reflux disease without esophagitis: Secondary | ICD-10-CM

## 2023-10-20 DIAGNOSIS — I10 Essential (primary) hypertension: Secondary | ICD-10-CM

## 2023-10-20 DIAGNOSIS — R0789 Other chest pain: Secondary | ICD-10-CM

## 2023-10-20 DIAGNOSIS — E1165 Type 2 diabetes mellitus with hyperglycemia: Secondary | ICD-10-CM

## 2023-10-20 LAB — POCT GLYCOSYLATED HEMOGLOBIN (HGB A1C): Hemoglobin A1C: 6 % — AB (ref 4.0–5.6)

## 2023-10-20 MED ORDER — FAMOTIDINE 20 MG PO TABS
20.0000 mg | ORAL_TABLET | Freq: Two times a day (BID) | ORAL | 2 refills | Status: DC
Start: 2023-10-20 — End: 2024-02-23
  Filled 2023-10-20: qty 60, 30d supply, fill #0
  Filled 2023-11-23: qty 60, 30d supply, fill #1
  Filled 2023-12-28: qty 60, 30d supply, fill #2

## 2023-10-20 MED ORDER — METFORMIN HCL ER 500 MG PO TB24
1000.0000 mg | ORAL_TABLET | Freq: Two times a day (BID) | ORAL | 3 refills | Status: DC
  Filled 2023-10-20: qty 120, 30d supply, fill #0
  Filled 2023-12-14: qty 120, 30d supply, fill #1
  Filled 2024-03-16: qty 120, 30d supply, fill #2
  Filled 2024-06-13: qty 120, 30d supply, fill #3

## 2023-10-20 MED ORDER — ISOSORBIDE MONONITRATE ER 60 MG PO TB24
60.0000 mg | ORAL_TABLET | Freq: Two times a day (BID) | ORAL | 0 refills | Status: DC
  Filled 2023-10-20: qty 180, 90d supply, fill #0

## 2023-10-20 NOTE — Progress Notes (Unsigned)
 Established Patient Office Visit  Subjective  Patient ID: Jeffery Sweeney, male    DOB: Jun 17, 1960  Age: 63 y.o. MRN: [id number]  Chief Complaint  Patient presents with   Chest wall pain (left)    Hospital follow up.  Patient also stated that Pantoprazole  is not helping with his acid reflux.   Patient is a pleasant 63 year old male patient who presents for follow-up after going to [facility] for chest pain. He has significant past medical history for CAD status post CABG x 3, prior stroke, hyperlipidemia, & poorly controlled T2DM.   5/29: pt went to [facility] for L lateral chest/axilla pain that worsened with positional movement.  He reports that his L arm felt very heavy, denies swelling or pain in his left arm.  Lidocaine  patches have helped. Echo completed with LVEF 54%.  Unremarkable serial troponins, EKG- with no ST elevation or depression, normal CXR.   DIABETES: Current medication regimen: metformin  1000mg  BID  Denies chest pain, shortness of breath, vision changes, polydipsia, polyphagia, polyuria, open wounds/ulcers on feet.  Denies hypoglycemia.  Patient is trying to adhere to a diabetic diet. Anything sweet he consumes is sugar-free.  Pertinent lab work: A1C: 11.6%, last checked on 06/16/2023  Monitoring: blood sugar readings at home: no          Well controlled: A1c today 6.0%        10/20/2023    2:10 PM 06/16/2023    9:12 AM  GAD 7 : Generalized Anxiety Score  Nervous, Anxious, on Edge 1 1  Control/stop worrying 1 1  Worry too much - different things 2 2  Trouble relaxing 2 2  Restless 1 2  Easily annoyed or irritable 0 2  Afraid - awful might happen 1 2  Total GAD 7 Score 8 12  Anxiety Difficulty Somewhat difficult Not difficult at all       10/20/2023    2:06 PM 06/16/2023    9:11 AM  PHQ9 SCORE ONLY  PHQ-9 Total Score 2 7    Lab Results  Component Value Date   HGBA1C 6.0 (A) 10/20/2023    No foot exam found Lab Results  Component Value Date   LABMICR  67.9 06/24/2023    Wt Readings from Last 3 Encounters:  10/20/23 183 lb (83 kg)  10/15/23 179 lb (81.2 kg)  07/10/23 173 lb (78.5 kg)   ROS: see HPI     Objective:      BP (!) 149/89   Pulse (!) 58   Temp (!) 97.5 F (36.4 C) (Oral)   Ht 5\' 3"  (1.6 m)   Wt 183 lb (83 kg)   BMI 32.42 kg/m  BP Readings from Last 3 Encounters:  10/20/23 (!) 149/89  10/15/23 133/87  07/10/23 135/89     Physical Exam Vitals reviewed.  Constitutional:      Appearance: Normal appearance.  Cardiovascular:     Rate and Rhythm: Normal rate and regular rhythm.     Pulses: Normal pulses.     Heart sounds: Normal heart sounds.  Pulmonary:     Effort: Pulmonary effort is normal.     Breath sounds: Normal breath sounds.  Neurological:     Mental Status: He is alert.  Psychiatric:        Mood and Affect: Mood normal.        Behavior: Behavior normal.       Assessment & Plan:   1. Musculoskeletal chest pain (Primary) Patient is a pleasant 63 year old male  patient who presents today for follow up after going to [facility] for chest pain. Patient appears well and is in no acute distress. Vital signs within acceptable limits. Patient reports that he has been taking Tylenol  PRN and using the lidocaine  patches prescribed with adequate relief of the pain. Denies current chest pain, back pain, shortness of breath, vision changes, peripheral edema, weakness, N/V, lightheadedness, and palpitations.   2. Uncontrolled type 2 diabetes mellitus with hyperglycemia (HCC) Hemoglobin A1c decreased from 11.6% to 6.0% with lifestyle modifications and taking metformin  1,000mg  twice daily. Advised patient to continue with lifestyle modifications and continue with current medication regimen.  - POCT glycosylated hemoglobin (Hb A1C) - metFORMIN  (GLUCOPHAGE -XR) 500 MG 24 hr tablet; Take 2 tablets (1,000 mg total) by mouth 2 (two) times daily with a meal.  Dispense: 120 tablet; Refill: 3  3. Primary  hypertension Initial blood pressure reading 148/93, repeat blood pressure 149/89. Patient is in no acute distress and is well-appearing. Patient presents today with slightly elevated blood pressure. Patient in no acute distress and is well-appearing. Denies chest pain, shortness of breath, lower extremity edema, vision changes, headaches. Cardiovascular exam with heart regular rate and rhythm. Normal heart sounds, no murmurs present. No lower extremity edema present. Lungs clear to auscultation bilaterally. Patient is currently taking Imdur  60mg  BID and metoprolol  50mg  BID. Refill provided today for Imdur . Advised patient to continue taking aspirin  81mg  daily, plavix  75mg  daily, and rosuvastatin  40mg  daily due to cardiac history. Patient was prescribed Repatha -  unclear if he is currently taking this medication (will reach out to pharmacist). Patient followed by cardiology- last OV Jan 2025 with 6 month follow-up and no future appointment scheduled. Patient would benefit from close follow-up with cardiology, as blood pressure is not at goal.   - isosorbide  mononitrate (IMDUR ) 60 MG 24 hr tablet; Take 1 tablet (60 mg total) by mouth 2 (two) times daily.  Dispense: 180 tablet; Refill: 0  4. Gastroesophageal reflux disease, unspecified whether esophagitis present Patient reports that his GERD is uncontrolled with Protonix  40mg  daily. Review of patient's diet is mostly processed and unhealthy foods. Discussed lifestyle modifications- including avoiding fried, fatty foods, consuming a large meal prior to bedtime, limiting portion size, increasing water intake, and avoiding spicy foods. Will add famotidine  twice daily for improvement in acid reflux symptoms.  - famotidine  (PEPCID ) 20 MG tablet; Take 1 tablet (20 mg total) by mouth 2 (two) times daily.  Dispense: 60 tablet; Refill: 2     Return in about 4 weeks (around 11/17/2023) for fasting cholesterol & acid reflux.    Wilhelmena Hanson, FNP

## 2023-10-20 NOTE — Patient Instructions (Signed)

## 2023-10-21 ENCOUNTER — Telehealth: Payer: Self-pay | Admitting: Pharmacist

## 2023-10-21 ENCOUNTER — Other Ambulatory Visit: Payer: Self-pay

## 2023-10-21 ENCOUNTER — Other Ambulatory Visit: Payer: Self-pay | Admitting: Pharmacist

## 2023-10-21 DIAGNOSIS — I2510 Atherosclerotic heart disease of native coronary artery without angina pectoris: Secondary | ICD-10-CM

## 2023-10-21 NOTE — Progress Notes (Signed)
   10/21/2023  Patient ID: Jeffery Sweeney, male   DOB: [birth year], 63 y.o.   MRN: [id number]  Receive a message from PCP requesting follow up to patient regarding assistance with obtaining Repatha .  Note previously spoke with patient for an initial appointment on 2/21 and at that time I advised him to follow up with Medication Management at [facility] for support with applying for assistance/med management clinic. From review of chart, note patient did connect with [facility]. Since then, he has not kept our follow up appointments/scheduler not able to reach him.  Collaborate with [facility] today via Secure Chat. [facility] Pharmacist advises [facility] tried to initiate patient assistance application for Repatha  for patient, but needs patient's income document to start application.  Reach patient by telephone today.   Reviewed long term complications of uncontrolled cholesterol and use of Repatha  for ASCVD risk reduction  States that his sister has the tax documents and is currently out of town, but understands that he needs to bring a copy to [facility] as soon as his is able to proceed with patient assistance application for Repatha   Follow Up Plan: Clinical Pharmacist will follow up with patient by telephone on 11/04/2023 at 4:00 PM     Arthur Lash, PharmD, [facility] [phone]

## 2023-10-21 NOTE — Patient Instructions (Signed)
 Goals Addressed             This Visit's Progress    Pharmacy Goals       Please follow up with [facility] to provide your income document for your Repatha  assistance application  [facility] [address] [place], [place] [address] [phone]  The goal A1c is less than 7%. This is the best way to reduce the risk of the long term complications of diabetes, including heart disease, kidney disease, eye disease, strokes, and nerve damage. An A1c of less than 7% corresponds with fasting sugars less than 130 and 2 hour after meal sugars less than 180. Please pick up a blood sugar monitor from the pharmacy and start to check your blood sugar at home.  Check your blood pressure twice weekly, and any time you have concerning symptoms like headache, chest pain, dizziness, shortness of breath, or vision changes.   Our goal is less than 130/80.  To appropriately check your blood pressure, make sure you do the following:  1) Avoid caffeine, exercise, or tobacco products for 30 minutes before checking. Empty your bladder. 2) Sit with your back supported in a flat-backed chair. Rest your arm on something flat (arm of the chair, table, etc). 3) Sit still with your feet flat on the floor, resting, for at least 5 minutes.  4) Check your blood pressure. Take 1-2 readings.  5) Write down these readings and bring with you to any provider appointments.  Bring your home blood pressure machine with you to a provider's office for accuracy comparison at least once a year.   Make sure you take your blood pressure medications before you come to any office visit, even if you were asked to fast for labs.  Arthur Lash, PharmD, [facility] [phone]

## 2023-10-21 NOTE — Progress Notes (Signed)
   Outreach Note  10/21/2023 Name: Kebron Pulse MRN: [id number] DOB: Sep 07, 1960  Referred by: Wilhelmena Hanson, FNP  Was unable to reach patient via telephone today and unable to leave a message as voicemail box is full.   Arthur Lash, PharmD, [facility] [phone]

## 2023-10-22 ENCOUNTER — Other Ambulatory Visit: Payer: Self-pay

## 2023-11-04 ENCOUNTER — Other Ambulatory Visit: Payer: Self-pay | Admitting: Pharmacist

## 2023-11-04 ENCOUNTER — Telehealth: Payer: Self-pay | Admitting: Pharmacist

## 2023-11-04 NOTE — Progress Notes (Signed)
   Outreach Note  11/04/2023 Name: Jeffery Sweeney MRN: [id number] DOB: May 09, 1961  Referred by: Wilhelmena Hanson, FNP  Was unable to reach patient via telephone today and have left HIPAA compliant voicemail asking patient to return my call.    Follow Up Plan: Will attempt to reach patient by telephone again within the next month  Arthur Lash, PharmD, [facility] [phone]

## 2023-11-12 ENCOUNTER — Other Ambulatory Visit: Payer: Self-pay

## 2023-11-16 ENCOUNTER — Other Ambulatory Visit: Payer: Self-pay

## 2023-11-17 ENCOUNTER — Encounter: Payer: Self-pay | Admitting: Family Medicine

## 2023-11-17 ENCOUNTER — Ambulatory Visit (INDEPENDENT_AMBULATORY_CARE_PROVIDER_SITE_OTHER): Payer: Self-pay | Admitting: Family Medicine

## 2023-11-17 VITALS — BP 130/88 | HR 84 | Resp 16 | Ht 63.0 in | Wt 183.0 lb

## 2023-11-17 DIAGNOSIS — K219 Gastro-esophageal reflux disease without esophagitis: Secondary | ICD-10-CM

## 2023-11-17 DIAGNOSIS — E1165 Type 2 diabetes mellitus with hyperglycemia: Secondary | ICD-10-CM

## 2023-11-17 NOTE — Progress Notes (Signed)
   Established Patient Office Visit  Subjective  Patient ID: Jeffery Sweeney, male    DOB: [birth year]  Age: 63 y.o. MRN: [id number]  Chief Complaint  Patient presents with   Hyperlipidemia   Jeffery Sweeney is a pleasant 63 year old male patient who presents today for GERD follow-up.   GERD: has been taking Pepcid  BID and focuses on drinking water  Worsens when he eats certain foods- has been trying to stay away from these foods  Starts feeling nauseous and like someone is stabbing his stomach  Reports lower abdominal pain and then it goes away   ROS: see HPI    Objective:    BP 130/88   Pulse 84   Resp 16   Ht 5' 3 (1.6 m)   Wt 183 lb (83 kg)   BMI 32.42 kg/m  BP Readings from Last 3 Encounters:  11/17/23 130/88  10/20/23 (!) 149/89  10/15/23 133/87    Physical Exam Vitals reviewed.  Constitutional:      Appearance: Normal appearance.   Cardiovascular:     Rate and Rhythm: Normal rate and regular rhythm.     Pulses: Normal pulses.     Heart sounds: Normal heart sounds.  Pulmonary:     Effort: Pulmonary effort is normal.     Breath sounds: Normal breath sounds.   Neurological:     Mental Status: He is alert.   Psychiatric:        Mood and Affect: Mood normal.        Behavior: Behavior normal.     Assessment & Plan:   1. Uncontrolled type 2 diabetes mellitus with hyperglycemia (HCC) (Primary) Chronic. Currently taking metformin  1000mg  BID. Last A1c completed 10/20/2023 with result of 6.0%. He reports he has been tolerating this medication well. Patient is also taking Crestor  40mg  daily. He has not had his lipid panel assessed recently. Will check fasting lipid panel today.   2. Gastroesophageal reflux disease without esophagitis Patient reports he is taking famotidine  20mg  BID. He reports it is helping improve his acid reflux, along with diet modifications. Advised him that it may be beneficial to see GI specialist for possible endoscopy. Discussed  screening colonoscopy is also due.   3. Hypocalcemia Previous BMP with calcium  level of 8.8. Will recheck calcium  level today.    Return in about 2 months (around 01/20/2024) for Physical w A1c.    Evalene Arts, FNP

## 2023-11-17 NOTE — Patient Instructions (Signed)
 Smoking Cessation: Quitline[place] 1-800-QUIT-NOW 707-701-6721); Espaol: 1-855-Djelo-Ya (1-780-445-4976) [email or web address]

## 2023-11-23 ENCOUNTER — Other Ambulatory Visit: Payer: Self-pay

## 2023-11-30 ENCOUNTER — Emergency Department
Admission: EM | Admit: 2023-11-30 | Discharge: 2023-11-30 | Disposition: A | Discharge status: Home / Self Care | Payer: Self-pay | Attending: Emergency Medicine | Admitting: Emergency Medicine

## 2023-11-30 ENCOUNTER — Ambulatory Visit: Payer: Self-pay

## 2023-11-30 ENCOUNTER — Emergency Department: Payer: Self-pay

## 2023-11-30 DIAGNOSIS — I110 Hypertensive heart disease with heart failure: Secondary | ICD-10-CM | POA: Insufficient documentation

## 2023-11-30 DIAGNOSIS — Z951 Presence of aortocoronary bypass graft: Secondary | ICD-10-CM | POA: Insufficient documentation

## 2023-11-30 DIAGNOSIS — I509 Heart failure, unspecified: Secondary | ICD-10-CM | POA: Insufficient documentation

## 2023-11-30 DIAGNOSIS — I2510 Atherosclerotic heart disease of native coronary artery without angina pectoris: Secondary | ICD-10-CM | POA: Insufficient documentation

## 2023-11-30 DIAGNOSIS — R0789 Other chest pain: Secondary | ICD-10-CM | POA: Insufficient documentation

## 2023-11-30 DIAGNOSIS — E119 Type 2 diabetes mellitus without complications: Secondary | ICD-10-CM | POA: Insufficient documentation

## 2023-11-30 LAB — CBC
HCT: 43.7 % (ref 39.0–52.0)
Hemoglobin: 14.7 g/dL (ref 13.0–17.0)
MCH: 29.8 pg (ref 26.0–34.0)
MCHC: 33.6 g/dL (ref 30.0–36.0)
MCV: 88.5 fL (ref 80.0–100.0)
Platelets: 232 K/uL (ref 150–400)
RBC: 4.94 MIL/uL (ref 4.22–5.81)
RDW: 13.1 % (ref 11.5–15.5)
WBC: 8 K/uL (ref 4.0–10.5)
nRBC: 0 % (ref 0.0–0.2)

## 2023-11-30 LAB — BASIC METABOLIC PANEL WITH GFR
Anion gap: 9 (ref 5–15)
BUN: 10 mg/dL (ref 8–23)
CO2: 21 mmol/L — ABNORMAL LOW (ref 22–32)
Calcium: 8.8 mg/dL — ABNORMAL LOW (ref 8.9–10.3)
Chloride: 110 mmol/L (ref 98–111)
Creatinine, Ser: 0.74 mg/dL (ref 0.61–1.24)
GFR, Estimated: >60 mL/min (ref >60)
Glucose, Bld: 162 mg/dL — ABNORMAL HIGH (ref 70–99)
Potassium: 3.7 mmol/L (ref 3.5–5.1)
Sodium: 140 mmol/L (ref 135–145)

## 2023-11-30 LAB — TROPONIN I (HIGH SENSITIVITY)
Troponin I (High Sensitivity): 3 ng/L (ref <18)
Troponin I (High Sensitivity): 3 ng/L (ref <18)

## 2023-11-30 LAB — BRAIN NATRIURETIC PEPTIDE: B Natriuretic Peptide: 42.4 pg/mL (ref 0.0–100.0)

## 2023-11-30 MED ORDER — ALUM & MAG HYDROXIDE-SIMETH 200-200-20 MG/5ML PO SUSP
15.0000 mL | Freq: Once | ORAL | Status: AC
  Administered 2023-11-30: 15 mL via ORAL
  Filled 2023-11-30: qty 30

## 2023-11-30 MED ORDER — IPRATROPIUM-ALBUTEROL 0.5-2.5 (3) MG/3ML IN SOLN
3.0000 mL | Freq: Once | RESPIRATORY_TRACT | Status: AC
  Administered 2023-11-30: 3 mL via RESPIRATORY_TRACT
  Filled 2023-11-30: qty 3

## 2023-11-30 MED ORDER — LIDOCAINE VISCOUS HCL 2 % MT SOLN
15.0000 mL | Freq: Once | OROMUCOSAL | Status: AC
  Administered 2023-11-30: 15 mL via OROMUCOSAL
  Filled 2023-11-30: qty 15

## 2023-11-30 NOTE — Telephone Encounter (Signed)
 FYI Only or Action Required?: FYI only for provider.  Patient was last seen in primary care on 11/17/2023 by Towana Small, FNP.  Called Nurse Triage reporting Chest Pain.  Symptoms began yesterday.  Interventions attempted: OTC medications: 81 mg aspirin .  Symptoms are: unchanged.  Triage Disposition: Call EMS 911 Now  Patient/caregiver understands and will follow disposition?: Yes  Copied from CRM [id number]. Topic: Clinical - Red Word Triage >> Nov 30, 2023  1:03 PM Winona R wrote: Pt experiencing (Gas attacks in his chest)  chest discomfort he believes its indigestion and medication is not working Reason for Disposition  [1] Chest pain lasts > 5 minutes AND [2] age > 30 AND [3] one or more cardiac risk factors (e.g., diabetes, high blood pressure, high cholesterol, obesity with BMI 30 or higher, smoker, or strong family history of heart disease)  Answer Assessment - Initial Assessment Questions High risk pt called reporting mid-chest pressure that he believes to be related to GERD. This RN advised that 911 is indicated and patient agreed. Second triage RN contacted 911. This RN stayed on the phone with patient until Fire/EMS arrived.   1. LOCATION: Where does it hurt?       Mid chest 2. RADIATION: Does the pain go anywhere else? (e.g., into neck, jaw, arms, back)     No 3. ONSET: When did the chest pain begin? (Minutes, hours or days)      11/29/23 4. PATTERN: Does the pain come and go, or has it been constant since it started?  Does it get worse with exertion?      Relieved with burping 7. CARDIAC RISK FACTORS: Do you have any history of heart problems or risk factors for heart disease? (e.g., angina, prior heart attack; diabetes, high blood pressure, high cholesterol, smoker, or strong family history of heart disease)     MI, DMII, HTN 9. CAUSE: What do you think is causing the chest pain?     States he believes it is r/t GERD 10. OTHER SYMPTOMS: Do you have any  other symptoms? (e.g., dizziness, nausea, vomiting, sweating, fever, difficulty breathing, cough)       Sweating, but believes it is d/t the heat, Denies being cold and sweaty  Protocols used: Chest Pain-A-AH

## 2023-11-30 NOTE — Discharge Instructions (Signed)
 Markers looking at your heart were all reassuring today with no signs of heart damage.  No evidence of heart failure.  Otherwise no concerning signs of infection in your bloodstream or your lungs.  I suspect this is more likely related to reflux symptoms and you should follow-up with your primary care provider for ongoing evaluation and management outpatient.  Please return for any severe or worsening symptoms.

## 2023-11-30 NOTE — ED Provider Notes (Signed)
 [facility] Provider Note    Event Date/Time   First MD Initiated Contact with Patient 11/30/23 1611     (approximate)   History   Chest Pain   HPI Rojelio Uhrich is a 63 y.o. male with history of of CAD, CABG x 3, HFpEF, HTN, DM2 presenting today for chest pain.  Patient states earlier today he had an episode where he felt like he had burping in his esophagus.  He drank some water and it resolved it.  He then drank a soda and felt like he had recurrence of the symptoms.  They are mild to nonexistent at this time.  Denies any associated shortness of breath, nausea, vomiting, sweating, leg pain, leg swelling.  States this does not feel like prior issues he has had with his heart.  He does admit to chronic smoking history.     Physical Exam   Triage Vital Signs: ED Triage Vitals  Encounter Vitals Group     BP 11/30/23 1404 (!) 153/98     Girls Systolic BP Percentile --      Girls Diastolic BP Percentile --      Boys Systolic BP Percentile --      Boys Diastolic BP Percentile --      Pulse Rate 11/30/23 1404 73     Resp 11/30/23 1404 18     Temp 11/30/23 1404 98.4 F (36.9 C)     Temp Source 11/30/23 1404 Oral     SpO2 11/30/23 1404 97 %     Weight 11/30/23 1400 183 lb (83 kg)     Height 11/30/23 1400 5' 3 (1.6 m)     Head Circumference --      Peak Flow --      Pain Score 11/30/23 1400 0     Pain Loc --      Pain Education --      Exclude from Growth Chart --     Most recent vital signs: Vitals:   11/30/23 1404  BP: (!) 153/98  Pulse: 73  Resp: 18  Temp: 98.4 F (36.9 C)  SpO2: 97%   Physical Exam: I have reviewed the vital signs and nursing notes. General: Awake, alert, no acute distress.  Nontoxic appearing. Head:  Atraumatic, normocephalic.   ENT:  EOM intact, PERRL. Oral mucosa is pink and moist with no lesions. Neck: Neck is supple with full range of motion,  Cardiovascular:  RRR, No murmurs. Peripheral pulses palpable  and equal bilaterally. Respiratory:  Symmetrical chest wall expansion.  No rhonchi, rales, crackles.  Mild expiratory wheeze noted.  No tachypnea Musculoskeletal:  No cyanosis or edema. Moving extremities with full ROM Abdomen:  Soft, nontender, nondistended. Neuro:  GCS 15, moving all four extremities, interacting appropriately. Speech clear. Psych:  Calm, appropriate.   Skin:  Warm, dry, no rash.    ED Results / Procedures / Treatments   Labs (all labs ordered are listed, but only abnormal results are displayed) Labs Reviewed  BASIC METABOLIC PANEL WITH GFR - Abnormal; Notable for the following components:      Result Value   CO2 21 (*)    Glucose, Bld 162 (*)    Calcium  8.8 (*)    All other components within normal limits  CBC  BRAIN NATRIURETIC PEPTIDE  TROPONIN I (HIGH SENSITIVITY)  TROPONIN I (HIGH SENSITIVITY)     EKG My EKG interpretation: Rate of 76, normal sinus rhythm, normal axis, normal intervals.  No acute ST  elevations or depressions   RADIOLOGY Independently turbid chest x-ray with no acute pathology   PROCEDURES:  Critical Care performed: No  Procedures   MEDICATIONS ORDERED IN ED: Medications  ipratropium-albuterol  (DUONEB) 0.5-2.5 (3) MG/3ML nebulizer solution 3 mL (3 mLs Nebulization Given 11/30/23 1638)  alum & mag hydroxide-simeth (MAALOX/MYLANTA) 200-200-20 MG/5ML suspension 15 mL (15 mLs Oral Given 11/30/23 1638)  lidocaine  (XYLOCAINE ) 2 % viscous mouth solution 15 mL (15 mLs Mouth/Throat Given 11/30/23 1638)     IMPRESSION / MDM / ASSESSMENT AND PLAN / ED COURSE  I reviewed the triage vital signs and the nursing notes.                              Differential diagnosis includes, but is not limited to, gastritis, reflux, esophagitis, COPD/emphysema, pneumonia, less likely ACS  Patient's presentation is most consistent with acute complicated illness / injury requiring diagnostic workup.  Patient is a 63 year old male presenting today for  episode of chest pain.  He does not have any associated symptoms and they seem to improve after drinking water but had recurrence after having a soda beverage.  His exam at this time most notable for slight expiratory wheeze which he states he does use an inhaler although he does not feel particularly short of breath at this time.  Will try DuoNeb.  EKG without any ischemic findings and CBC, BMP normal.  First troponin negative.  Symptoms sound possibly more reflux in nature so we will give GI cocktail and check second troponin/BNP.  Repeat troponin also negative and BMP negative.  Patient was reassessed following medications and feels significantly better especially with the GI cocktail.  Low suspicion for any cardiac etiology at this time and suspect more likely related to reflux.  He is stable for discharge and recommended follow-up with his PCP for ongoing management and strict return precautions.     FINAL CLINICAL IMPRESSION(S) / ED DIAGNOSES   Final diagnoses:  Atypical chest pain     Rx / DC Orders   ED Discharge Orders     None        Note:  This document was prepared using Dragon voice recognition software and may include unintentional dictation errors.   Malvina Alm DASEN, MD 11/30/23 [phone]

## 2023-11-30 NOTE — ED Notes (Signed)
 This nurse spoke with the lab and per staff BNP will be added to blood previously collected

## 2023-11-30 NOTE — ED Triage Notes (Signed)
 Pt to ED from home with CP/ SOB x2 days. Pt states he was recently taken off of Lasix . Pt speaking in complete sentences in triage and stating his chest feels tight all the time.   Hx triple bypass

## 2023-12-02 ENCOUNTER — Telehealth: Payer: Self-pay | Admitting: Pharmacist

## 2023-12-02 ENCOUNTER — Other Ambulatory Visit: Payer: Self-pay | Admitting: Pharmacist

## 2023-12-02 ENCOUNTER — Telehealth: Payer: Self-pay | Admitting: Family Medicine

## 2023-12-02 NOTE — Progress Notes (Signed)
   Outreach Note  12/02/2023 Name: Jeffery Sweeney MRN: [id number] DOB: 03-09-1961  Referred by: Towana Small, FNP  Was unable to reach patient via telephone today and have left HIPAA compliant voicemail asking patient to return my call. Outreach attempt #2.   Sharyle Sia, PharmD, [facility] [phone]

## 2023-12-02 NOTE — Telephone Encounter (Signed)
 Copied from CRM [id number]. Topic: Appointments - Scheduling Inquiry for Clinic >> Dec 02, 2023  4:33 PM Tobias L wrote: Reason for CRM: Patient returning Elisabeth's call. Patient had telephone visit with her today at 3:30pm, patient missed call and requesting appointment be rescheduled.

## 2023-12-14 ENCOUNTER — Other Ambulatory Visit (HOSPITAL_COMMUNITY): Payer: Self-pay

## 2023-12-14 ENCOUNTER — Other Ambulatory Visit: Payer: Self-pay

## 2023-12-16 ENCOUNTER — Other Ambulatory Visit: Payer: Self-pay

## 2023-12-18 ENCOUNTER — Other Ambulatory Visit: Payer: Self-pay

## 2023-12-22 ENCOUNTER — Other Ambulatory Visit: Payer: Self-pay

## 2023-12-28 ENCOUNTER — Other Ambulatory Visit: Payer: Self-pay

## 2024-01-05 ENCOUNTER — Telehealth: Payer: Self-pay

## 2024-01-05 NOTE — Progress Notes (Signed)
 Complex Care Management Care Guide Note  01/05/2024 Name: Jeffery Sweeney MRN: [id number] DOB: Aug 20, 1960  Jeffery Sweeney is a 63 y.o. year old male who is a primary care patient of Towana Small, FNP and is actively engaged with the care management team. I reached out to Jeffery Sweeney by phone today to assist with re-scheduling  with the Pharmacist.  Follow up plan: Unsuccessful telephone outreach attempt made. A HIPAA compliant phone message was left for the patient providing contact information and requesting a return call.  Jeffery Sweeney , RMA     [facility]  [facility], [facility] Guide  Direct Dial: [phone]  Website: [email or web address]

## 2024-01-14 ENCOUNTER — Other Ambulatory Visit: Payer: Self-pay

## 2024-01-14 ENCOUNTER — Ambulatory Visit: Payer: Self-pay | Admitting: Family

## 2024-01-19 ENCOUNTER — Telehealth: Payer: Self-pay | Admitting: Family Medicine

## 2024-01-19 NOTE — Telephone Encounter (Signed)
 Copied from CRM #[id number]. Topic: General - Other >> Jan 19, 2024  8:39 AM Viola F wrote: Reason for CRM: Patient needs a letter for work stating how much he is able to lift/restrictions. He would like to pick up the letter today by 1pm before he goes to work. Please call [phone] or [phone] (M)

## 2024-01-22 ENCOUNTER — Other Ambulatory Visit: Payer: Self-pay

## 2024-01-22 NOTE — Progress Notes (Signed)
 Complex Care Management Care Guide Note  01/22/2024 Name: Jeffery Sweeney MRN: [id number] DOB: Mar 21, 1961  Jeffery Sweeney is a 63 y.o. year old male who is a primary care patient of Towana Small, FNP and is actively engaged with the care management team. I reached out to Jeffery Sweeney by phone today to assist with re-scheduling  with the Pharmacist.  Follow up plan: Unsuccessful telephone outreach attempt made. A HIPAA compliant phone message was left for the patient providing contact information and requesting a return call.  [name] , RMA     [facility]  [facility], [facility] Guide  Direct Dial: [phone]  Website: [email or web address]

## 2024-01-26 ENCOUNTER — Other Ambulatory Visit: Payer: Self-pay

## 2024-01-26 ENCOUNTER — Other Ambulatory Visit: Payer: Self-pay | Admitting: Family Medicine

## 2024-01-26 ENCOUNTER — Encounter: Payer: Self-pay | Admitting: Family Medicine

## 2024-01-26 DIAGNOSIS — I10 Essential (primary) hypertension: Secondary | ICD-10-CM

## 2024-01-26 MED FILL — Pantoprazole Sodium EC Tab 40 MG (Base Equiv): ORAL | 30 days supply | Qty: 30 | Fill #3 | Status: CN

## 2024-01-26 MED FILL — Pantoprazole Sodium EC Tab 40 MG (Base Equiv): ORAL | 30 days supply | Qty: 30 | Fill #3 | Status: AC

## 2024-01-27 ENCOUNTER — Other Ambulatory Visit: Payer: Self-pay

## 2024-01-27 MED FILL — Isosorbide Mononitrate Tab ER 24HR 60 MG: ORAL | 90 days supply | Qty: 180 | Fill #0 | Status: AC

## 2024-01-27 NOTE — Telephone Encounter (Signed)
 Copied from CRM [id number]. Topic: Clinical - Prescription Issue >> Jan 27, 2024 12:10 PM Delon T wrote: Reason for CRM: isosorbide  mononitrate (IMDUR ) 60 MG 24 hr tablet- request was submitted, out of this medication  Left vm MUST make appointment as he was to schedule for 01/20/24. Will send one month refill

## 2024-01-28 ENCOUNTER — Other Ambulatory Visit: Payer: Self-pay

## 2024-02-04 ENCOUNTER — Other Ambulatory Visit: Payer: Self-pay

## 2024-02-15 ENCOUNTER — Other Ambulatory Visit: Payer: Self-pay

## 2024-02-15 ENCOUNTER — Ambulatory Visit: Payer: Self-pay

## 2024-02-15 ENCOUNTER — Other Ambulatory Visit: Payer: Self-pay | Admitting: Cardiovascular Disease

## 2024-02-15 MED ORDER — CLOPIDOGREL BISULFATE 75 MG PO TABS
75.0000 mg | ORAL_TABLET | Freq: Every day | ORAL | 3 refills | Status: AC
  Filled 2024-02-15: qty 30, 30d supply, fill #0
  Filled 2024-03-15: qty 28, 28d supply, fill #1
  Filled 2024-03-15: qty 2, 2d supply, fill #1
  Filled 2024-04-25: qty 30, 30d supply, fill #2
  Filled 2024-06-13: qty 30, 30d supply, fill #3

## 2024-02-15 NOTE — Telephone Encounter (Signed)
 FYI Only or Action Required?: FYI only for provider.  Patient was last seen in primary care on 11/17/2023 by Towana Small, FNP.  Called Nurse Triage reporting Dizziness.  Symptoms began several days ago.  Interventions attempted: Nothing.  Symptoms are: unchanged.  Triage Disposition: See PCP When Office is Open (Within 3 Days)  Patient/caregiver understands and will follow disposition?: Yes    Copied from CRM [id number]. Topic: Clinical - Red Word Triage >> Feb 15, 2024 10:18 AM Harlene ORN wrote: Red Word that prompted transfer to Nurse Triage: dizzy spells Vertigo is starting up again. Reason for Disposition  [1] MODERATE dizziness (e.g., interferes with normal activities) AND [2] has been evaluated by doctor (or NP/PA) for this  Answer Assessment - Initial Assessment Questions 1. DESCRIPTION: Describe your dizziness.     States hx of vertigo 2. LIGHTHEADED: Do you feel lightheaded? (e.g., somewhat faint, woozy, weak upon standing)     dizzy 3. VERTIGO: Do you feel like either you or the room is spinning or tilting? (i.e., vertigo)     Only when he wakes up in the morning or when he bends down; spinning 4. SEVERITY: How bad is it?  Do you feel like you are going to faint? Can you stand and walk?     Comes and goes, woozy,mild 5. ONSET:  When did the dizziness begin?     Last week 6. AGGRAVATING FACTORS: Does anything make it worse? (e.g., standing, change in head position)     See above 7. HEART RATE: Can you tell me your heart rate? How many beats in 15 seconds?  (Note: Not all patients can do this.)       denies 8. CAUSE: What do you think is causing the dizziness? (e.g., decreased fluids or food, diarrhea, emotional distress, heat exposure, new medicine, sudden standing, vomiting; unknown)     Hx vertigo 9. RECURRENT SYMPTOM: Have you had dizziness before? If Yes, ask: When was the last time? What happened that time?     Yes, hx vertigo 10.  OTHER SYMPTOMS: Do you have any other symptoms? (e.g., fever, chest pain, vomiting, diarrhea, bleeding)       sweating 11. PREGNANCY: Is there any chance you are pregnant? When was your last menstrual period?       na  Protocols used: Dizziness - Lightheadedness-A-AH

## 2024-02-16 ENCOUNTER — Other Ambulatory Visit: Payer: Self-pay

## 2024-02-18 ENCOUNTER — Ambulatory Visit: Payer: Self-pay | Admitting: Family Medicine

## 2024-02-19 ENCOUNTER — Ambulatory Visit: Payer: Self-pay | Admitting: Family Medicine

## 2024-02-22 ENCOUNTER — Other Ambulatory Visit: Payer: Self-pay | Admitting: Family Medicine

## 2024-02-22 ENCOUNTER — Other Ambulatory Visit: Payer: Self-pay | Admitting: Cardiovascular Disease

## 2024-02-22 ENCOUNTER — Other Ambulatory Visit: Payer: Self-pay

## 2024-02-23 ENCOUNTER — Ambulatory Visit: Payer: Self-pay | Admitting: Family Medicine

## 2024-02-23 ENCOUNTER — Encounter: Payer: Self-pay | Admitting: Family Medicine

## 2024-02-23 ENCOUNTER — Other Ambulatory Visit: Payer: Self-pay

## 2024-02-23 VITALS — BP 137/86 | HR 82 | Resp 16 | Ht 63.0 in | Wt 190.0 lb

## 2024-02-23 DIAGNOSIS — E785 Hyperlipidemia, unspecified: Secondary | ICD-10-CM

## 2024-02-23 DIAGNOSIS — F5101 Primary insomnia: Secondary | ICD-10-CM

## 2024-02-23 DIAGNOSIS — I214 Non-ST elevation (NSTEMI) myocardial infarction: Secondary | ICD-10-CM

## 2024-02-23 DIAGNOSIS — I10 Essential (primary) hypertension: Secondary | ICD-10-CM

## 2024-02-23 DIAGNOSIS — R42 Dizziness and giddiness: Secondary | ICD-10-CM

## 2024-02-23 DIAGNOSIS — K219 Gastro-esophageal reflux disease without esophagitis: Secondary | ICD-10-CM

## 2024-02-23 MED ORDER — ISOSORBIDE MONONITRATE ER 30 MG PO TB24
60.0000 mg | ORAL_TABLET | Freq: Two times a day (BID) | ORAL | 2 refills | Status: AC
  Filled 2024-02-23: qty 360, 90d supply, fill #0

## 2024-02-23 MED ORDER — TRAZODONE HCL 50 MG PO TABS
25.0000 mg | ORAL_TABLET | Freq: Every evening | ORAL | 2 refills | Status: DC | PRN
  Filled 2024-02-23: qty 30, 30d supply, fill #0

## 2024-02-23 MED ORDER — METOPROLOL TARTRATE 50 MG PO TABS
50.0000 mg | ORAL_TABLET | Freq: Two times a day (BID) | ORAL | 5 refills | Status: DC
  Filled 2024-02-23: qty 60, 30d supply, fill #0
  Filled 2024-03-25: qty 60, 30d supply, fill #1
  Filled 2024-05-06: qty 60, 30d supply, fill #2
  Filled 2024-06-13: qty 60, 30d supply, fill #3

## 2024-02-23 MED ORDER — MECLIZINE HCL 25 MG PO TABS
50.0000 mg | ORAL_TABLET | Freq: Two times a day (BID) | ORAL | 2 refills | Status: DC | PRN
  Filled 2024-02-23: qty 120, 30d supply, fill #0
  Filled 2024-06-13: qty 120, 30d supply, fill #1

## 2024-02-23 MED ORDER — ASPIRIN 81 MG PO CHEW
81.0000 mg | CHEWABLE_TABLET | Freq: Every day | ORAL | 3 refills | Status: AC
  Filled 2024-02-23: qty 90, 90d supply, fill #0

## 2024-02-23 MED ORDER — PANTOPRAZOLE SODIUM 20 MG PO TBEC
40.0000 mg | DELAYED_RELEASE_TABLET | Freq: Every day | ORAL | 3 refills | Status: DC
  Filled 2024-02-23: qty 60, 30d supply, fill #0
  Filled 2024-03-25: qty 60, 30d supply, fill #1
  Filled 2024-05-06: qty 60, 30d supply, fill #2
  Filled 2024-06-13: qty 60, 30d supply, fill #3

## 2024-02-23 MED ORDER — ROSUVASTATIN CALCIUM 40 MG PO TABS
40.0000 mg | ORAL_TABLET | Freq: Every day | ORAL | 6 refills | Status: DC
  Filled 2024-02-23 – 2024-03-15 (×2): qty 30, 30d supply, fill #0
  Filled 2024-04-25: qty 30, 30d supply, fill #1
  Filled 2024-06-13: qty 30, 30d supply, fill #2

## 2024-02-23 NOTE — Patient Instructions (Addendum)
 [facility] [address] [place], [place] [address] [phone]   Smoking Cessation: Quitline[place] 1-800-QUIT-NOW 276-092-4417); Espaol: 1-855-Djelo-Ya (8-144-664-6430) [email or web address]      MyChart:  For all urgent or time sensitive needs we ask that you please call the office to avoid delays. Our number is [phone]) [id number]. MyChart is not constantly monitored and due to the large volume of messages a day, replies may take up to 72 business hours.   MyChart Policy: MyChart allows for you to see your visit notes, after visit summary, provider recommendations, lab and tests results, make an appointment, request refills, and contact your provider or the office for non-urgent questions or concerns. Providers are seeing patients during normal business hours and do not have built in time to review MyChart messages.  We ask that you allow a minimum of 3 business days for responses to [employer]. For this reason, please do not send urgent requests through MyChart. Please call the office at [phone]. New and ongoing conditions may require a visit. We have virtual and in person visit available for your convenience.  Complex MyChart concerns may require a visit. Your provider may request you schedule a virtual or in person visit to ensure we are providing the best care possible. MyChart messages sent after 11:00 AM on Friday will not be received by the provider until Monday morning.    Lab and Test Results: You will receive your lab and test results on MyChart as soon as they are completed and results have been sent by the lab or testing facility. Due to this service, you will receive your results BEFORE your provider.  I review lab and tests results each morning prior to seeing patients. Some results require collaboration with other providers to ensure you are receiving the most appropriate care. For this reason, we ask that you  please allow a minimum of 3-5 business days from the time the ALL results have been received for your provider to receive and review lab and test results and contact you about these.  Most lab and test result comments from the provider will be sent through MyChart. Your provider may recommend changes to the plan of care, follow-up visits, repeat testing, ask questions, or request an office visit to discuss these results. You may reply directly to this message or call the office at [phone] to provide information for the provider or set up an appointment. In some instances, you will be called with test results and recommendations. Please let us  know if this is preferred and we will make note of this in your chart to provide this for you.    If you have not heard a response to your lab or test results in 5 business days from all results returning to MyChart, please call the office to let us  know. We ask that you please avoid calling prior to this time unless there is an emergent concern. Due to high call volumes, this can delay the resulting process.   After Hours: For all non-emergency after hours needs, please call the office at [phone] and select the option to reach the on-call provider service. On-call services are shared between multiple [place] offices and therefore it will not be possible to speak directly with your provider. On-call providers may provide medical advice and recommendations, but are unable to provide refills for maintenance medications.  For all emergency or urgent medical needs after normal business hours, we recommend that you seek care  at the closest Urgent Care or Emergency Department to ensure appropriate treatment in a timely manner.  [facility] has a 24 hour emergency room located on the ground floor for your convenience.    Urgent Concerns During the Business Day Providers are seeing patients from 8AM to 5PM, Monday through Thursday,  and 8AM to 12PM on Friday with a busy schedule and are most often not able to respond to non-urgent calls until the end of the day or the next business day. If you should have URGENT concerns during the day, please call and speak to the nurse or schedule a same day appointment so that we can address your concern without delay.    Thank you, again, for choosing me as your health care partner. I appreciate your trust and look forward to learning more about you.    Evalene Arts, FNP-C

## 2024-02-23 NOTE — Progress Notes (Signed)
 Established Patient Office Visit  Subjective  Patient ID: Jeffery Sweeney, male    DOB: 09/20/1960  Age: 63 y.o. MRN: [id number]  Chief Complaint  Patient presents with   Dizziness   Headache   Discussed the use of AI scribe software for clinical note transcription with the patient, who gave verbal consent to proceed.  History of Present Illness   Jeffery Sweeney is a 63 year old male with vertigo who presents with worsening dizziness and fatigue.  He experiences dizziness, particularly when bending over or changing positions, such as getting up too fast or rolling over in bed. The sensation is described as 'spinning' and lasts for a brief moment. Despite taking meclizine , symptoms persist, causing concern about having a spell while driving. He also experiences tinnitus, which sometimes gets louder, and occasional nausea, both before and after eating.  He reports excessive sweating, even in air-conditioned environments, and feels weak and tired after work. His job requires lifting heavy items (less than 10lbs). He works at [employer] and has stepped down from cooking due to the heat. He is in the process of obtaining insurance and will turn 64 next year.   He has difficulty sleeping, often unable to fall asleep until early morning despite feeling tired. He attributes some of his sleep issues to being disturbed by others at night. Fatigue is significant, making it difficult to get up after relaxing at home.  He reports blurred vision when looking down, despite having new glasses to correct his astigmatism. An eye doctor recently confirmed the astigmatism is still present. Denies diplopia, blurred vision, and decreased peripheral vision.   He has a history of a stroke and is currently taking aspirin  and Plavix . He also takes medication for acid reflux, which was recently switched from famotidine  to Protonix . He is unsure which medication works better for him.  ROS:  see HPI     Objective:     BP 137/86   Pulse 82   Resp 16   Ht 5' 3 (1.6 m)   Wt 190 lb (86.2 kg)   SpO2 96%   BMI 33.66 kg/m  BP Readings from Last 3 Encounters:  02/23/24 137/86  11/30/23 (!) 153/89  11/17/23 130/88    Physical Exam Vitals reviewed.  Constitutional:      Appearance: Normal appearance. He is well-developed.  Cardiovascular:     Rate and Rhythm: Normal rate and regular rhythm.     Pulses: Normal pulses.     Heart sounds: Normal heart sounds.  Pulmonary:     Effort: Pulmonary effort is normal.     Breath sounds: Normal breath sounds.  Musculoskeletal:     Right lower leg: No edema.     Left lower leg: No edema.  Neurological:     Mental Status: He is alert.     Cranial Nerves: No dysarthria.     Motor: No weakness.  Psychiatric:        Mood and Affect: Mood normal.        Behavior: Behavior normal.     Assessment & Plan:   1. Vertigo (Primary) Chronic vertigo with tinnitus and insomnia. Symptoms include spinning sensation and intensified tinnitus. Insomnia likely contributes to fatigue and stress. Increase meclizine  to 50 mg twice daily as needed. Prescribe trazodone 50 mg for sleep; take half or whole tablet as needed. Will place neurology referral due to persistent symptoms. Discuss potential for physical therapy for vertigo symptoms. - meclizine  (ANTIVERT ) 25  MG tablet; Take 2 tablets (50 mg total) by mouth 2 (two) times daily as needed for dizziness.  Dispense: 120 tablet; Refill: 2 - Ambulatory referral to Neurology  2. Primary hypertension Hypertension managed with medication. Experiences sweating and weakness, possibly related to exertion and medication side effects. Blood pressure control is essential. Refill provided.  - isosorbide  mononitrate (IMDUR ) 30 MG 24 hr tablet; Take 2 tablets (60 mg total) by mouth 2 (two) times daily.  Dispense: 360 tablet; Refill: 2  3. Hyperlipidemia LDL goal <70 Advised patient to schedule follow-up with  cardiology.  - rosuvastatin  (CRESTOR ) 40 MG tablet; Take 1 tablet by mouth once daily  Dispense: 30 tablet; Refill: 6 - aspirin  81 MG chewable tablet; Chew 1 tablet (81 mg total) by mouth daily.  Dispense: 90 tablet; Refill: 3  4. Gastroesophageal reflux disease, unspecified whether esophagitis present Rx provided to patient.  - pantoprazole  (PROTONIX ) 20 MG tablet; Take 2 tablets (40 mg total) by mouth daily.  Dispense: 60 tablet; Refill: 3  5. Non-ST elevation (NSTEMI) myocardial infarction [facility]) Patient has history of CVA & STEMI s/p CABG and reports residual numbness and chest tightness in his sternum. Continues aspirin  and Plavix  for prevention. No cardiology follow-up since June- advised patient to follow-up. Encourage scheduling a follow-up appointment with cardiology. Refill provided for patient for metoprolol .  - metoprolol  tartrate (LOPRESSOR ) 50 MG tablet; Take 1 tablet (50 mg total) by mouth 2 (two) times daily.  Dispense: 60 tablet; Refill: 5  6. Primary insomnia Patient reports that he has difficulty sleeping, often unable to fall asleep until early morning despite feeling tired. He attributes some of his sleep issues to being disturbed by others disturbing at night. Fatigue is significant, making it difficult to get up after relaxing at home. Discussed pharmacotherapy and advised patient to trial trazodone.   - traZODone (DESYREL) 50 MG tablet; Take 0.5-1 tablets (25-50 mg total) by mouth at bedtime as needed for sleep.  Dispense: 30 tablet; Refill: 2  Return in about 6 weeks (around 04/05/2024) for insomnia f/u (along with cardiology & neurology f/u).      Jeffery Arts, FNP

## 2024-03-15 ENCOUNTER — Ambulatory Visit: Payer: Self-pay | Admitting: Physician Assistant

## 2024-03-15 ENCOUNTER — Other Ambulatory Visit: Payer: Self-pay

## 2024-03-15 NOTE — Progress Notes (Deleted)
 Cardiology Office Note    Date:  03/15/2024   ID:  [name], DOB 1961-03-28, MRN [id number]  PCP:  Towana Small, FNP  Cardiologist:  Deatrice Cage, MD  Electrophysiologist:  None   Chief Complaint: ***  History of Present Illness:   Jeffery Sweeney is a 63 y.o. male with history of essential hypertension, tobacco use, hyperlipidemia, coronary artery disease s/p CABG, and CVA who presents for***.    Patient was initially evaluated 06/2020 with symptoms of unstable angina.  Diagnostic cardiac catheterization revealed significant three-vessel CAD.  Echo showed normal LV systolic function.  He underwent CABG x 3 (LIMA to LAD, SVG to PDA, and SVG to ramus).  He was evaluated 09/2020 for recurrent anginal symptoms and underwent cardiac catheterization which showed severe underlying three-vessel CAD with patent grafts.  Left circumflex had significant obstructive disease but was not bypassed due to small vessel size.  He had an emergent aspiration thrombectomy and DES placement.  He was placed on Aggrastat  but with recurrent chest pain and EKG changes after approximately 6 hours he underwent repeat emergent cardiac catheterization which revealed acute closure of the LIMA to LAD graft beyond the previously stented segment due to what seems to be a spiral dissection throughout the whole graft.  This was treated successfully with aspiration.  He had 7 overlapping stents placed to cover the whole length of the LIMA to the native LAD just beyond the anastomosis.  He was switched from Brilinta  to clopidogrel  due to cost.  Patient was hospitalized again 12/2020 with recurrent chest pain, cough, and shortness of breath.  He was noted to be febrile and tachycardic and was found to have pneumonia and into her Enterobacter bacteremia from unclear source.  Echo showed EF 50 to 55%, LVH, trivial MR, and aortic valve sclerosis without evidence of stenosis.  Repeat echocardiogram done 09/2021  revealed EF 55 to 60% with mild LVH, G1 DD, and aortic valve sclerosis without stenosis.  Patient was seen in the ED 07/2022 with complaints of chest pain.  He was noted to be hypertensive with blood pressure of 165/92 and complained of slight chest tightness.  He also noted shortness of breath the day prior.  He had reassuring vital signs, EKG, chest x-ray, and negative troponins.  He was treated with GI cocktail and recommended to follow-up with cardiology.  He was again evaluated in the emergency department 08/2022 for dizziness and shortness of breath.  He presented after an episode of lightheadedness/presyncope.  Workup in the ED was unrevealing and he was discharged home.  He was seen in follow-up at our clinic 08/2022 with complaints of worsening fatigue and associated shortness of breath.  He reported 1 episode of chest discomfort requiring visit to the emergency department and was advised that his blood pressure was elevated and that his workup was negative and subsequently sent home.  He was scheduled for repeat echocardiogram.  This was completed 10/2022 revealing EF 55 to 60%, G1 DD, mild MR, and aortic valve sclerosis without evidence of stenosis.   Patient was most recently seen in our office 06/16/2023 by Dr. Cage with concern for increasing blood pressure and midsternal chest pain/tightness.  He reported exertional dyspnea and intermittent chest tightness described as a soreness in the sternum as well as heavy sensation.  Ischemic evaluation was discussed but patient deferred given that he felt symptoms were largely stable.  He was evaluated in the emergency department 09/2023 with left lateral chest X axilla pain throughout  the day for which she had not taken any medications.  Workup was largely unremarkable including negative EKG, chest x-ray, labs, and troponin.  Suspected etiology was musculoskeletal strain.  He was discharged in stable condition with recommendation to follow-up with  cardiology.  He was evaluated in the ED again 11/2023 with an episode of chest pain.  He reported an episode of burping in his esophagus which resolved after drinking water.  He then drink soda with recurrence of symptoms.  They had resolved by the time he was evaluated in the ED.  Workup was unremarkable.  He was given GI cocktail.  Noted to have mild expiratory wheeze for which she was given DuoNebs.  Discharged in stable condition.  ***  Labs independently reviewed: 11/2023-Hgb 14.7, HCT 43.7, platelets 232, sodium 140, potassium 3.7, BUN 10, creatinine 0.74 05/2023-A1c 11.6, TC 143, TG 270, HDL 32, LDL 75  Objective   Past Medical History:  Diagnosis Date   CVA (cerebral vascular accident) (HCC) 06/2021   Small acute/subacute infarcts in the left occipital lobe and   Hyperlipidemia LDL goal <70    started on Repatha  09/26/2021, continue Crestor  40mg  daily   Hypertension    Multiple vessel coronary artery disease    Myocardial infarct (HCC)    x 3; PCI with single stent placement for each (in [place] )   NSTEMI (non-ST elevated myocardial infarction) (HCC)    LHC 2/18 with multivessel CAD and transfer to [facility] for CABG eval   STEMI (ST elevation myocardial infarction) (HCC) 09/23/2021   LHC 09/23/2021 x 2   Tobacco use    2 cigarettes daily   Unstable angina (HCC)    LHC 09/23/2021 x 2; total 8 DES to LIMA    Current Medications: No outpatient medications have been marked as taking for the 03/15/24 encounter (Appointment) with Abigail Bernardino HERO, PA-C.    Allergies:   Patient has no known allergies.   Social History   Socioeconomic History   Marital status: Divorced    Spouse name: Not on file   Number of children: Not on file   Years of education: Not on file   Highest education level: Not on file  Occupational History   Not on file  Tobacco Use   Smoking status: Some Days    Current packs/day: 0.00    Types: Cigarettes    Last attempt to quit: 07/06/2020    Years since  quitting: 3.6    Passive exposure: Past   Smokeless tobacco: Never   Tobacco comments:    [employer] info given. 11/17/23  Vaping Use   Vaping status: Never Used  Substance and Sexual Activity   Alcohol use: Not Currently   Drug use: Not Currently   Sexual activity: Not Currently  Other Topics Concern   Not on file  Social History Narrative   Not on file   Social Drivers of Health   Financial Resource Strain: Not on file  Food Insecurity: Food Insecurity Present (06/24/2023)   Hunger Vital Sign    Worried About Running Out of Food in the Last Year: Never true    Ran Out of Food in the Last Year: Sometimes true  Transportation Needs: No Transportation Needs (06/24/2023)   PRAPARE - Administrator, Civil Service (Medical): No    Lack of Transportation (Non-Medical): No  Physical Activity: Not on file  Stress: Not on file  Social Connections: Not on file     Family History:  The patient's family history  includes Diabetes Mellitus II in his father.  ROS:   12-point review of systems is negative unless otherwise noted in the HPI.  EKGs/Other Studies Reviewed:    Studies reviewed were summarized above. The additional studies were reviewed today:  10/2022 Echo complete 1. Left ventricular ejection fraction, by estimation, is 55 to 60%. The  left ventricle has normal function. The left ventricle has no regional  wall motion abnormalities. Left ventricular diastolic parameters are  consistent with Grade I diastolic  dysfunction (impaired relaxation). The average left ventricular global  longitudinal strain is -17.0 %.   2. Right ventricular systolic function is normal. The right ventricular  size is normal. Tricuspid regurgitation signal is inadequate for assessing  PA pressure.   3. The mitral valve is normal in structure. Mild mitral valve  regurgitation. No evidence of mitral stenosis.   4. The aortic valve has an indeterminant number of cusps. Aortic valve   regurgitation is mild. Aortic valve sclerosis is present, with no evidence  of aortic valve stenosis.   5. The inferior vena cava is normal in size with greater than 50%  respiratory variability, suggesting right atrial pressure of 3 mmHg.   03/2022 Lexiscan  myoview    The study is low risk.   No ST deviation was noted.   LV perfusion is normal.   Left ventricular function is normal. calculated LVEF is 54%   Patient is s/p cabg x 3.  EKG:  EKG personally reviewed by me today    PHYSICAL EXAM:    VS:  There were no vitals taken for this visit.  BMI: There is no height or weight on file to calculate BMI.  GEN: Well nourished, well developed in no acute distress NECK: No JVD; No carotid bruits CARDIAC: ***RRR, no murmurs, rubs, gallops RESPIRATORY:  Clear to auscultation without rales, wheezing or rhonchi  ABDOMEN: Soft, non-tender, non-distended EXTREMITIES:  *** No edema; No deformity  Wt Readings from Last 3 Encounters:  02/23/24 190 lb (86.2 kg)  11/30/23 183 lb (83 kg)  11/17/23 183 lb (83 kg)                  ASSESSMENT & PLAN:   Coronary artery disease   Essential hypertension   Hyperlipidemia   Tobacco use    {Are you ordering a CV Procedure (e.g. stress test, cath, DCCV, TEE, etc)?   Press F2        :[id number]}   Disposition: F/u with Dr. Darron or an APP in ***.   Medication Adjustments/Labs and Tests Ordered: Current medicines are reviewed at length with the patient today.  Concerns regarding medicines are outlined above. Medication changes, Labs and Tests ordered today are summarized above and listed in the Patient Instructions accessible in Encounters.   Bonney Lesley Maffucci, PA-C 03/15/2024 7:54 AM     [facility] [place] [address] [place], [place] [address] [phone]

## 2024-03-16 ENCOUNTER — Other Ambulatory Visit: Payer: Self-pay

## 2024-03-17 ENCOUNTER — Other Ambulatory Visit: Payer: Self-pay

## 2024-03-25 ENCOUNTER — Other Ambulatory Visit: Payer: Self-pay

## 2024-04-02 NOTE — Progress Notes (Deleted)
 "  Cardiology Office Note    Date:  04/02/2024   ID:  Jeffery Sweeney, DOB March 04, 1961, MRN [id number]  PCP:  Towana Small, FNP  Cardiologist:  Deatrice Cage, MD  Electrophysiologist:  None   Chief Complaint: Follow-up  History of Present Illness:   Jeffery Sweeney is a 63 y.o. male with history of CAD status post three-vessel CABG with LIMA to LAD, SVG to RPDA, SVG to ramus in 2022 status post extensive stenting as outlined below, HTN, HLD, and tobacco use who presents for follow-up of CAD.  He previously underwent PCI in [place]  in 2019.  He presented in 06/2020 with unstable angina.  LHC showed significant three-vessel CAD.  Echo showed normal LV systolic function.  He subsequently underwent three-vessel CABG.  He was seen in 09/2021 with recurrent angina.  LHC showed severe underlying three-vessel CAD with patent grafts.  The LCx had significant obstructive disease and was not bypassed due to small vessel size.  His LIMA to LAD had 80% ostial stenosis with hazy appearance.  When the LIMA was engaged there was acute closure of the graft.  Interventional cardiology performed emergent aspiration thrombectomy and DES placement.  He was placed on Aggrastat , though had recurrent chest pain and EKG changes after about 6 hours.  Repeat emergent LHC showed acute closure of LIMA to LAD beyond the previously stented segment due to what seems to be spiral dissection throughout the whole graft.  This was treated successfully with aspiration thrombectomy and 7 overlapped drug-eluting stents to cover the whole length of the LIMA and to the native LAD just beyond the anastomosis.  Echo at that time showed normal LV systolic function, normal wall motion, grade 1 diastolic dysfunction, and aortic valve sclerosis without evidence of stenosis.  He has had recurrent atypical chest pain since then.  Lexiscan  MPI in 03/2022 showed no evidence of ischemia and normal EF.  He was last seen in the office  in 05/2023 reporting exertional dyspnea and intermittent chest tightness that were overall largely stable with recommendation to continue to monitor.  Most recent echo in 10/2022 showed an EF of 55 to 60%, no regional wall motion abnormalities, grade 1 diastolic dysfunction, normal RV systolic function and ventricular cavity size, mild mitral regurgitation, mild aortic insufficiency, aortic valve sclerosis without evidence of stenosis, and a normal CVP.  He was seen in the ED in 09/2023 with left arm heaviness and discomfort in the left axilla, worse with positional movement.  High-sensitivity troponin negative x 2.  EKG nonacute.  Chest x-ray nonacute.  Symptoms were felt to be musculoskeletal in etiology.  He was most recently seen in the ED in 11/2023 with episode of he felt like he had burping in his esophagus.  He drank some water and it resolved.  He then drank a soda and felt like he had recurrence of the symptoms.  High-sensitivity troponin negative x 2.  BNP 42.  EKG nonacute.  Chest x-ray nonacute.  Symptoms improved with GI cocktail.  ***   Labs independently reviewed: 11/2023 - BNP 42, high-sensitivity troponin negative x 2, Hgb 14.7, PLT 232, potassium 3.7, BUN 10, serum creatinine 0.74 10/2023 - A1c 6.0 06/2023 - albumin  4.3, AST/ALT normal 05/2023 - TC 143, TG 217, HDL 32, LDL 75 11/2020 - TSH normal  Past Medical History:  Diagnosis Date   CVA (cerebral vascular accident) (HCC) 06/2021   Small acute/subacute infarcts in the left occipital lobe and   Hyperlipidemia LDL goal <70  started on Repatha  09/26/2021, continue Crestor  40mg  daily   Hypertension    Multiple vessel coronary artery disease    Myocardial infarct (HCC)    x 3; PCI with single stent placement for each (in [place] )   NSTEMI (non-ST elevated myocardial infarction) (HCC)    LHC 2/18 with multivessel CAD and transfer to [facility] for CABG eval   STEMI (ST elevation myocardial infarction) (HCC) 09/23/2021   LHC  09/23/2021 x 2   Tobacco use    2 cigarettes daily   Unstable angina (HCC)    LHC 09/23/2021 x 2; total 8 DES to LIMA    Past Surgical History:  Procedure Laterality Date   CORONARY ANGIOPLASTY WITH STENT PLACEMENT     in [place]  in 2018-19   CORONARY ARTERY BYPASS GRAFT N/A 07/09/2020   Procedure: CORONARY ARTERY BYPASS GRAFTING (CABG) x THREE , USING LEFT INTERNAL MAMMARY ARTERY, AND RIGHT LEG GREATER SAPHENOUS VEIN HARVESTED ENDOSCOPICALLY;  Surgeon: Army Dallas NOVAK, MD;  Location: [facility] OR;  Service: Open Heart Surgery;  Laterality: N/A;   CORONARY PRESSURE/FFR STUDY N/A 07/06/2020   Procedure: INTRAVASCULAR PRESSURE WIRE/FFR STUDY;  Surgeon: Darron Deatrice LABOR, MD;  Location: [facility] INVASIVE CV LAB;  Service: Cardiovascular;  Laterality: N/A;  LAD   CORONARY STENT INTERVENTION N/A 09/23/2021   Procedure: CORONARY STENT INTERVENTION;  Surgeon: Darron Deatrice LABOR, MD;  Location: [facility] INVASIVE CV LAB;  Service: Cardiovascular;  Laterality: N/A;   CORONARY/GRAFT ACUTE MI REVASCULARIZATION N/A 09/23/2021   Procedure: Coronary/Graft Acute MI Revascularization;  Surgeon: Darron Deatrice LABOR, MD;  Location: [facility] INVASIVE CV LAB;  Service: Cardiovascular;  Laterality: N/A;   LEFT HEART CATH AND CORONARY ANGIOGRAPHY N/A 07/06/2020   Procedure: LEFT HEART CATH AND CORONARY ANGIOGRAPHY;  Surgeon: Darron Deatrice LABOR, MD;  Location: [facility] INVASIVE CV LAB;  Service: Cardiovascular;  Laterality: N/A;   LEFT HEART CATH AND CORONARY ANGIOGRAPHY N/A 09/23/2021   Procedure: LEFT HEART CATH AND CORONARY ANGIOGRAPHY;  Surgeon: Darron Deatrice LABOR, MD;  Location: [facility] INVASIVE CV LAB;  Service: Cardiovascular;  Laterality: N/A;   LEFT HEART CATH AND CORS/GRAFTS ANGIOGRAPHY N/A 09/23/2021   Procedure: LEFT HEART CATH AND CORS/GRAFTS ANGIOGRAPHY;  Surgeon: Darron Deatrice LABOR, MD;  Location: [facility] INVASIVE CV LAB;  Service: Cardiovascular;  Laterality: N/A;   TEE WITHOUT CARDIOVERSION N/A 07/09/2020   Procedure: TRANSESOPHAGEAL  ECHOCARDIOGRAM (TEE);  Surgeon: Army Dallas NOVAK, MD;  Location[facility] OR;  Service: Open Heart Surgery;  Laterality: N/A;   WRIST SURGERY Left    for cyst    Current Medications: No outpatient medications have been marked as taking for the 04/06/24 encounter (Appointment) with Abigail Bernardino HERO, PA-C.    Allergies:   Patient has no known allergies.   Social History   Socioeconomic History   Marital status: Divorced    Spouse name: Not on file   Number of children: Not on file   Years of education: Not on file   Highest education level: Not on file  Occupational History   Not on file  Tobacco Use   Smoking status: Some Days    Current packs/day: 0.00    Types: Cigarettes    Last attempt to quit: 07/06/2020    Years since quitting: 3.7    Passive exposure: Past   Smokeless tobacco: Never   Tobacco comments:    [employer] info given. 11/17/23  Vaping Use   Vaping status: Never Used  Substance and Sexual Activity   Alcohol use: Not Currently   Drug use: Not  Currently   Sexual activity: Not Currently  Other Topics Concern   Not on file  Social History Narrative   Not on file   Social Drivers of Health   Financial Resource Strain: Not on file  Food Insecurity: Food Insecurity Present (06/24/2023)   Hunger Vital Sign    Worried About Running Out of Food in the Last Year: Never true    Ran Out of Food in the Last Year: Sometimes true  Transportation Needs: No Transportation Needs (06/24/2023)   PRAPARE - Administrator, Civil Service (Medical): No    Lack of Transportation (Non-Medical): No  Physical Activity: Not on file  Stress: Not on file  Social Connections: Not on file     Family History:  The patient's family history includes Diabetes Mellitus II in his father.  ROS:   12-point review of systems is negative unless otherwise noted in the HPI.   EKGs/Labs/Other Studies Reviewed:    Studies reviewed were summarized above. The additional studies were reviewed  today:  2D echo 11/13/2022: 1. Left ventricular ejection fraction, by estimation, is 55 to 60%. The  left ventricle has normal function. The left ventricle has no regional  wall motion abnormalities. Left ventricular diastolic parameters are  consistent with Grade I diastolic  dysfunction (impaired relaxation). The average left ventricular global  longitudinal strain is -17.0 %.   2. Right ventricular systolic function is normal. The right ventricular  size is normal. Tricuspid regurgitation signal is inadequate for assessing  PA pressure.   3. The mitral valve is normal in structure. Mild mitral valve  regurgitation. No evidence of mitral stenosis.   4. The aortic valve has an indeterminant number of cusps. Aortic valve  regurgitation is mild. Aortic valve sclerosis is present, with no evidence  of aortic valve stenosis.   5. The inferior vena cava is normal in size with greater than 50%  respiratory variability, suggesting right atrial pressure of 3 mmHg.  __________  Lexiscan  MPI 04/09/2022:   The study is low risk.   No ST deviation was noted.   LV perfusion is normal.   Left ventricular function is normal. calculated LVEF is 54%   Patient is s/p cabg x 3. __________  2D echo 10/02/2021: 1. Left ventricular ejection fraction, by estimation, is 55 to 60%. The  left ventricle has normal function. The left ventricle has no regional  wall motion abnormalities. There is mild left ventricular hypertrophy.  Left ventricular diastolic parameters  are consistent with Grade I diastolic dysfunction (impaired relaxation).   2. Right ventricular systolic function is normal. The right ventricular  size is normal.   3. The mitral valve is normal in structure. No evidence of mitral valve  regurgitation.   4. The aortic valve is tricuspid. Aortic valve regurgitation is not  visualized. Aortic valve sclerosis/calcification is present, without any  evidence of aortic stenosis.   __________  2D echo 09/24/2021: 1. Left ventricular ejection fraction, by estimation, is 50 to 55%. The  left ventricle has low normal function. The left ventricle demonstrates  regional wall motion abnormalities (see scoring diagram/findings for  description). There is mild left  ventricular hypertrophy. Left ventricular diastolic parameters were  normal.   2. Right ventricular systolic function is normal. The right ventricular  size is normal. There is normal pulmonary artery systolic pressure.   3. The mitral valve is normal in structure. Trivial mitral valve  regurgitation. No evidence of mitral stenosis.   4. The aortic  valve has an indeterminant number of cusps. There is  moderate thickening of the aortic valve. Aortic valve regurgitation is not  visualized. Aortic valve sclerosis is present, with no evidence of aortic  valve stenosis.   5. The inferior vena cava is normal in size with greater than 50%  respiratory variability, suggesting right atrial pressure of 3 mmHg.  __________  LHC 09/23/2021: 1.  Acute closure of LIMA to LAD beyond the previously stented segment due to what seems to be spiral dissection throughout the whole graft to the anastomosis.  Native vessel angiography showed that the LAD did not supply apical flow in spite of an occluded LIMA. 2.  Successful aspiration thrombectomy, balloon angioplasty and 7 overlapped drug-eluting stents to cover the whole length of the LIMA to beyond the anastomosis and establish TIMI-3 flow.   Recommendations: Continue Aggrastat  infusion for another 12 hours. Switch clopidogrel  to ticagrelor  The LIMA graft is at high risk for target vessel failure given multiple overlapped stents but the supplied area is not very large given that the mid and proximal LAD distribution are supplied by native vessel flow. __________  LHC 09/23/2021:   Dist RCA lesion is 30% stenosed.   2nd Diag lesion is 80% stenosed.   1st Diag lesion is 90%  stenosed.   Lat 1st Diag lesion is 90% stenosed.   RPDA lesion is 90% stenosed.   Mid Cx to Dist Cx lesion is 80% stenosed.   2nd Mrg lesion is 80% stenosed.   Prox LAD to Mid LAD lesion is 80% stenosed.   Prox RCA to Mid RCA lesion is 90% stenosed.   Origin to Mid Graft lesion before Prox RCA  is 30% stenosed.   Origin to Prox Graft lesion is 80% stenosed.   A drug-eluting stent was successfully placed using a STENT ONYX FRONTIER 3.5X18.   Post intervention, there is a 0% residual stenosis.   SVG.   The graft exhibits mild .   The left ventricular systolic function is normal.   LV end diastolic pressure is mildly elevated.   The left ventricular ejection fraction is 55-65% by visual estimate.   1.  Severe underlying three-vessel coronary artery disease with patent grafts including LIMA to LAD, SVG to diagonal and SVG to right PDA.  The left circumflex has significant disease but is not bypassed due to small vessel size.  The vessel is at most 2 mm in diameter.  The LIMA to LAD had an 80% ostial stenosis with hazy appearance.  There was loss of flow in the LIMA after diagnostic angiography likely due to disruption of plaque and vessel closure. 2.  Normal LV systolic function mildly elevated left ventricular end-diastolic pressure. 3.  Successful angioplasty, drug-eluting stent placement and aspiration thrombectomy to LIMA to LAD with restoration of TIMI-3 flow.  Residual thrombus was still present and thus Aggrastat  infusion was started and will be continued for 18 hours.   Recommendations: Dual antiplatelet therapy for at least 12 months. Aggressive treatment of risk factors. We will monitor the patient closely in the stepdown ICU given increased risk of acute vessel closure. __________  2D echo 07/06/2021: 1. Left ventricular ejection fraction, by estimation, is 60 to 65%. The  left ventricle has normal function. The left ventricle has no regional  wall motion abnormalities. Left  ventricular diastolic parameters are  consistent with Grade I diastolic  dysfunction (impaired relaxation).   2. Right ventricular systolic function is normal. The right ventricular  size is normal. There  is normal pulmonary artery systolic pressure. The  estimated right ventricular systolic pressure is 26.0 mmHg.   3. The mitral valve is normal in structure. Mild mitral valve  regurgitation. No evidence of mitral stenosis.   4. The aortic valve is normal in structure. Aortic valve regurgitation is  not visualized. Aortic valve sclerosis is present, with no evidence of  aortic valve stenosis.   5. The inferior vena cava is normal in size with greater than 50%  respiratory variability, suggesting right atrial pressure of 3 mmHg.  __________  Lexiscan  MPI 12/07/2020: Pharmacological myocardial perfusion imaging study with no significant  ischemia Normal wall motion, EF estimated at 66% No EKG changes concerning for ischemia at peak stress or in recovery. CT attenuation correction images with mild aortic atherosclerosis, mild coronary calcification of the LAD Low risk scan __________  Vascular imaging 07/08/2020: Summary:  Right Carotid: The extracranial vessels were near-normal with only minimal  wall thickening or plaque.   Left Carotid: The extracranial vessels were near-normal with only minimal  wall thickening or plaque.   Vertebrals: Bilateral vertebral arteries demonstrate antegrade flow.   Right ABI: Resting right ankle-brachial index is within normal range. No  evidence of significant right lower extremity arterial disease.  Left ABI: Resting left ankle-brachial index is within normal range. No  evidence of significant left lower extremity arterial disease.  Right Upper Extremity: Doppler waveforms remain within normal limits with  right radial compression. Doppler waveforms decrease >50% with right ulnar  compression.  Left Upper Extremity: Doppler waveforms decrease >50%  with left radial  compression. Doppler waveforms decrease >50% with left ulnar compression.  __________  LHC 07/06/2020: The left ventricular systolic function is normal. LV end diastolic pressure is normal. The left ventricular ejection fraction is 55-65% by visual estimate. Prox RCA lesion is 80% stenosed. Dist RCA lesion is 30% stenosed. RPDA lesion is 90% stenosed. Prox LAD to Mid LAD lesion is 60% stenosed. 1st Diag lesion is 90% stenosed. Lat 1st Diag lesion is 90% stenosed. Mid Cx to Dist Cx lesion is 90% stenosed. 2nd Diag lesion is 80% stenosed. 2nd Mrg lesion is 90% stenosed.   1.  Significant three-vessel coronary artery disease.  The LAD disease appears moderate angiographically.  However, it is diffusely diseased in the proximal mid segment and was highly significant by fractional flow reserve evaluation with an IFR ratio of 0.74.  In addition, there is severe stenosis in the bifurcating first diagonal, second diagonal and right coronary artery.  The left circumflex is also significantly diseased in the mid to distal segment but OM branches are small and likely not graftable. 2.  Normal LV systolic function and normal left ventricular end-diastolic pressure.   Recommendations: I recommend transfer to [facility] for CABG evaluation. Resume heparin  drip 6 hours after sheath pull. __________  2D echo 07/06/2020: 1. Left ventricular ejection fraction, by estimation, is 55 to 60%. The  left ventricle has normal function. The left ventricle has no regional  wall motion abnormalities. There is mild left ventricular hypertrophy.  Left ventricular diastolic parameters  were normal. The average left ventricular global longitudinal strain is  -15.2 %. The global longitudinal strain is abnormal.   2. Right ventricular systolic function is normal. The right ventricular  size is normal.   3. The mitral valve is normal in structure. No evidence of mitral valve  regurgitation.  No evidence of mitral stenosis.   4. The aortic valve is normal in structure. Aortic valve regurgitation  is  not visualized. No aortic stenosis is present.   5. The inferior vena cava is normal in size with greater than 50%  respiratory variability, suggesting right atrial pressure of 3 mmHg.    EKG:  EKG is ordered today.  The EKG ordered today demonstrates ***  Recent Labs: 07/10/2023: ALT 15 11/30/2023: B Natriuretic Peptide 42.4; BUN 10; Creatinine, Ser 0.74; Hemoglobin 14.7; Platelets 232; Potassium 3.7; Sodium 140  Recent Lipid Panel    Component Value Date/Time   CHOL 143 06/16/2023 1019   TRIG 217 (H) 06/16/2023 1019   HDL 32 (L) 06/16/2023 1019   CHOLHDL 4.5 06/16/2023 1019   CHOLHDL 4.3 12/30/2022 1717   VLDL 75 (H) 12/30/2022 1717   LDLCALC 75 06/16/2023 1019    PHYSICAL EXAM:    VS:  There were no vitals taken for this visit.  BMI: There is no height or weight on file to calculate BMI.  Physical Exam  Wt Readings from Last 3 Encounters:  02/23/24 190 lb (86.2 kg)  11/30/23 183 lb (83 kg)  11/17/23 183 lb (83 kg)     ASSESSMENT & PLAN:   CAD status post CABG status post subsequent PCI with ***:  HTN: Blood pressure  HLD: LDL 75 in 05/2023 with normal AST/ALT in 06/2023.  Tobacco use:   {Are you ordering a CV Procedure (e.g. stress test, cath, DCCV, TEE, etc)?   Press F2        :[id number]}     Disposition: F/u with Dr. Darron or an APP in ***.   Medication Adjustments/Labs and Tests Ordered: Current medicines are reviewed at length with the patient today.  Concerns regarding medicines are outlined above. Medication changes, Labs and Tests ordered today are summarized above and listed in the Patient Instructions accessible in Encounters.   Signed, Bernardino Bring, PA-C 04/02/2024 11:44 AM     [facility] [place] [address] [place], [place] [address] [phone] "

## 2024-04-05 ENCOUNTER — Ambulatory Visit: Payer: Self-pay | Admitting: Family Medicine

## 2024-04-06 ENCOUNTER — Ambulatory Visit: Payer: Self-pay | Attending: Physician Assistant | Admitting: Physician Assistant

## 2024-04-25 ENCOUNTER — Other Ambulatory Visit: Payer: Self-pay

## 2024-04-27 ENCOUNTER — Encounter: Payer: Self-pay | Admitting: Emergency Medicine

## 2024-04-27 ENCOUNTER — Inpatient Hospital Stay
Admission: EM | Admit: 2024-04-27 | Discharge: 2024-05-03 | DRG: 446 | Disposition: A | Discharge status: Home / Self Care | Payer: MEDICAID | Attending: Internal Medicine | Admitting: Internal Medicine

## 2024-04-27 ENCOUNTER — Emergency Department: Payer: MEDICAID

## 2024-04-27 ENCOUNTER — Other Ambulatory Visit: Payer: Self-pay

## 2024-04-27 DIAGNOSIS — E119 Type 2 diabetes mellitus without complications: Secondary | ICD-10-CM

## 2024-04-27 DIAGNOSIS — K8050 Calculus of bile duct without cholangitis or cholecystitis without obstruction: Principal | ICD-10-CM | POA: Diagnosis present

## 2024-04-27 DIAGNOSIS — E1169 Type 2 diabetes mellitus with other specified complication: Secondary | ICD-10-CM

## 2024-04-27 LAB — COMPREHENSIVE METABOLIC PANEL WITH GFR
ALT: 20 U/L (ref 0–44)
AST: 23 U/L (ref 15–41)
Albumin: 4.3 g/dL (ref 3.5–5.0)
Alkaline Phosphatase: 61 U/L (ref 38–126)
Anion gap: 13 (ref 5–15)
BUN: 10 mg/dL (ref 8–23)
CO2: 25 mmol/L (ref 22–32)
Calcium: 9.3 mg/dL (ref 8.9–10.3)
Chloride: 101 mmol/L (ref 98–111)
Creatinine, Ser: 1.08 mg/dL (ref 0.61–1.24)
GFR, Estimated: >60 mL/min (ref >60)
Glucose, Bld: 145 mg/dL — ABNORMAL HIGH (ref 70–99)
Potassium: 4.1 mmol/L (ref 3.5–5.1)
Sodium: 139 mmol/L (ref 135–145)
Total Bilirubin: 1 mg/dL (ref 0.0–1.2)
Total Protein: 7.6 g/dL (ref 6.5–8.1)

## 2024-04-27 LAB — URINALYSIS, ROUTINE W REFLEX MICROSCOPIC
Bilirubin Urine: NEGATIVE
Glucose, UA: NEGATIVE mg/dL
Hgb urine dipstick: NEGATIVE
Ketones, ur: NEGATIVE mg/dL
Leukocytes,Ua: NEGATIVE
Nitrite: NEGATIVE
Protein, ur: 30 mg/dL — AB
Specific Gravity, Urine: 1.026 (ref 1.005–1.030)
Squamous Epithelial / HPF: 0 /HPF (ref 0–5)
pH: 5 (ref 5.0–8.0)

## 2024-04-27 LAB — LIPASE, BLOOD: Lipase: 24 U/L (ref 11–51)

## 2024-04-27 LAB — CBC
HCT: 44.6 % (ref 39.0–52.0)
Hemoglobin: 15.4 g/dL (ref 13.0–17.0)
MCH: 30.1 pg (ref 26.0–34.0)
MCHC: 34.5 g/dL (ref 30.0–36.0)
MCV: 87.1 fL (ref 80.0–100.0)
Platelets: 278 K/uL (ref 150–400)
RBC: 5.12 MIL/uL (ref 4.22–5.81)
RDW: 13.2 % (ref 11.5–15.5)
WBC: 9.1 K/uL (ref 4.0–10.5)
nRBC: 0 % (ref 0.0–0.2)

## 2024-04-27 MED ORDER — SODIUM CHLORIDE 0.9 % IV SOLN: INTRAVENOUS | Status: AC

## 2024-04-27 MED ORDER — ENOXAPARIN SODIUM 40 MG/0.4ML IJ SOSY
40.0000 mg | PREFILLED_SYRINGE | INTRAMUSCULAR | Status: DC
  Administered 2024-04-27: 40 mg via SUBCUTANEOUS
  Filled 2024-04-27: qty 0.4

## 2024-04-27 MED ORDER — MAGNESIUM HYDROXIDE 400 MG/5ML PO SUSP
30.0000 mL | Freq: Every day | ORAL | Status: DC | PRN
  Administered 2024-05-02: 19:00:00 30 mL via ORAL
  Filled 2024-04-27: qty 30

## 2024-04-27 MED ORDER — SODIUM CHLORIDE 0.9 % IV BOLUS
500.0000 mL | Freq: Once | INTRAVENOUS | Status: AC
  Administered 2024-04-27: 500 mL via INTRAVENOUS

## 2024-04-27 MED ORDER — OXYCODONE-ACETAMINOPHEN 5-325 MG PO TABS
1.0000 | ORAL_TABLET | ORAL | Status: DC | PRN
  Administered 2024-04-27: 1 via ORAL
  Filled 2024-04-27: qty 1

## 2024-04-27 MED ORDER — ONDANSETRON HCL 4 MG PO TABS: 4.0000 mg | ORAL_TABLET | Freq: Four times a day (QID) | ORAL | Status: DC | PRN

## 2024-04-27 MED ORDER — ONDANSETRON HCL 4 MG/2ML IJ SOLN: 4.0000 mg | Freq: Four times a day (QID) | INTRAMUSCULAR | Status: DC | PRN

## 2024-04-27 MED ORDER — HYDROMORPHONE HCL 1 MG/ML IJ SOLN
0.5000 mg | Freq: Once | INTRAMUSCULAR | Status: AC
  Administered 2024-04-27: 0.5 mg via INTRAVENOUS
  Filled 2024-04-27: qty 0.5

## 2024-04-27 MED ORDER — MORPHINE SULFATE (PF) 4 MG/ML IV SOLN
4.0000 mg | Freq: Once | INTRAVENOUS | Status: AC
  Administered 2024-04-27: 4 mg via INTRAVENOUS
  Filled 2024-04-27: qty 1

## 2024-04-27 MED ORDER — ACETAMINOPHEN 325 MG PO TABS
650.0000 mg | ORAL_TABLET | Freq: Four times a day (QID) | ORAL | Status: DC | PRN
  Administered 2024-04-29 – 2024-05-03 (×3): 650 mg via ORAL
  Filled 2024-04-27 (×3): qty 2

## 2024-04-27 MED ORDER — TRAZODONE HCL 50 MG PO TABS: 25.0000 mg | ORAL_TABLET | Freq: Every evening | ORAL | Status: DC | PRN

## 2024-04-27 MED ORDER — ONDANSETRON HCL 4 MG/2ML IJ SOLN
4.0000 mg | Freq: Once | INTRAMUSCULAR | Status: AC
  Administered 2024-04-27: 4 mg via INTRAVENOUS
  Filled 2024-04-27: qty 2

## 2024-04-27 MED ORDER — ACETAMINOPHEN 650 MG RE SUPP: 650.0000 mg | Freq: Four times a day (QID) | RECTAL | Status: DC | PRN

## 2024-04-27 NOTE — H&P (Addendum)
 [place]   PATIENT NAME: Jeffery Sweeney    MR#:  [id number]  DATE OF BIRTH:  May 26, 1960  DATE OF ADMISSION:  04/27/2024  PRIMARY CARE PHYSICIAN: Towana Small, FNP   Patient is coming from: Home  REQUESTING/REFERRING PHYSICIAN: Fernand Blase, MD  CHIEF COMPLAINT:   Chief Complaint  Patient presents with   Abdominal Pain    HISTORY OF PRESENT ILLNESS:  Crockett Rallo is a 63 y.o. Caucasian male with medical history significant for dyslipidemia, CVA, hypertension, coronary artery disease, status post PCI and stent, and tobacco abuse, who presented to the ER with acute onset of epigastric and right upper quadrant abdominal pain that started today.  No nausea or vomiting or diarrhea.  No chest pain or palpitations.  No dysuria,  oliguria or hematuria or flank pain.  No bleeding diathesis.  ED Course: When the patient came to the ER, BP was 124/102 with respiratory rate of 22 and otherwise normal vital signs.  Labs revealed blood glucose of 145 with otherwise unremarkable CMP.  High-sensitivity troponin was less than 15.  CBC was normal.  UA was unremarkable.   EKG as reviewed by me : EKG showed normal sinus rhythm with a rate of 80. Imaging: Right upper quadrant ultrasound revealed: 1. Cholelithiasis without sonographic evidence of acute cholecystitis. 2. Borderline dilated common bile duct measuring 8 mm, unchanged from prior CT. Distal duct not visualized due to overlying bowel gas. 3. Diffuse hepatic steatosis.  The patient was given 4 mg of IV morphine  sulfate twice, 0.5 mg of IV Dilaudid , 4 mg of IV Zofran , 1 p.o. Percocet and 500 mL IV normal saline bolus.  He will be admitted to a medical telemetry observation bed for further evaluation and management. PAST MEDICAL HISTORY:   Past Medical History:  Diagnosis Date   CVA (cerebral vascular accident) (HCC) 06/2021   Small acute/subacute infarcts in the left occipital lobe and   Hyperlipidemia LDL goal <70     started on Repatha  09/26/2021, continue Crestor  40mg  daily   Hypertension    Multiple vessel coronary artery disease    Myocardial infarct (HCC)    x 3; PCI with single stent placement for each (in [place] )   NSTEMI (non-ST elevated myocardial infarction) (HCC)    LHC 2/18 with multivessel CAD and transfer to [facility] for CABG eval   STEMI (ST elevation myocardial infarction) (HCC) 09/23/2021   LHC 09/23/2021 x 2   Tobacco use    2 cigarettes daily   Unstable angina (HCC)    LHC 09/23/2021 x 2; total 8 DES to LIMA    PAST SURGICAL HISTORY:   Past Surgical History:  Procedure Laterality Date   CORONARY ANGIOPLASTY WITH STENT PLACEMENT     in [place]  in 2018-19   CORONARY ARTERY BYPASS GRAFT N/A 07/09/2020   Procedure: CORONARY ARTERY BYPASS GRAFTING (CABG) x THREE , USING LEFT INTERNAL MAMMARY ARTERY, AND RIGHT LEG GREATER SAPHENOUS VEIN HARVESTED ENDOSCOPICALLY;  Surgeon: Army Dallas NOVAK, MD;  Location: [facility] OR;  Service: Open Heart Surgery;  Laterality: N/A;   CORONARY PRESSURE/FFR STUDY N/A 07/06/2020   Procedure: INTRAVASCULAR PRESSURE WIRE/FFR STUDY;  Surgeon: Darron Deatrice LABOR, MD;  Location: [facility] INVASIVE CV LAB;  Service: Cardiovascular;  Laterality: N/A;  LAD   CORONARY STENT INTERVENTION N/A 09/23/2021   Procedure: CORONARY STENT INTERVENTION;  Surgeon: Darron Deatrice LABOR, MD;  Location: [facility] INVASIVE CV LAB;  Service: Cardiovascular;  Laterality: N/A;   CORONARY/GRAFT ACUTE MI REVASCULARIZATION N/A 09/23/2021  Procedure: Coronary/Graft Acute MI Revascularization;  Surgeon: Darron Deatrice LABOR, MD;  Location: [facility] INVASIVE CV LAB;  Service: Cardiovascular;  Laterality: N/A;   LEFT HEART CATH AND CORONARY ANGIOGRAPHY N/A 07/06/2020   Procedure: LEFT HEART CATH AND CORONARY ANGIOGRAPHY;  Surgeon: Darron Deatrice LABOR, MD;  Location: [facility] INVASIVE CV LAB;  Service: Cardiovascular;  Laterality: N/A;   LEFT HEART CATH AND CORONARY ANGIOGRAPHY N/A 09/23/2021   Procedure: LEFT HEART CATH AND  CORONARY ANGIOGRAPHY;  Surgeon: Darron Deatrice LABOR, MD;  Location: [facility] INVASIVE CV LAB;  Service: Cardiovascular;  Laterality: N/A;   LEFT HEART CATH AND CORS/GRAFTS ANGIOGRAPHY N/A 09/23/2021   Procedure: LEFT HEART CATH AND CORS/GRAFTS ANGIOGRAPHY;  Surgeon: Darron Deatrice LABOR, MD;  Location: [facility] INVASIVE CV LAB;  Service: Cardiovascular;  Laterality: N/A;   TEE WITHOUT CARDIOVERSION N/A 07/09/2020   Procedure: TRANSESOPHAGEAL ECHOCARDIOGRAM (TEE);  Surgeon: Army Dallas NOVAK, MD;  Location: [facility] OR;  Service: Open Heart Surgery;  Laterality: N/A;   WRIST SURGERY Left    for cyst    SOCIAL HISTORY:   Social History   Tobacco Use   Smoking status: Some Days    Current packs/day: 0.00    Average packs/day: 0.5 packs/day    Types: Cigarettes    Last attempt to quit: 07/06/2020    Years since quitting: 3.8    Passive exposure: Past   Smokeless tobacco: Never   Tobacco comments:    [employer] info given. 11/17/23  Substance Use Topics   Alcohol use: Not Currently    FAMILY HISTORY:   Family History  Problem Relation Age of Onset   Diabetes Mellitus II Father     DRUG ALLERGIES:  No Known Allergies  REVIEW OF SYSTEMS:   ROS As per history of present illness. All pertinent systems were reviewed above. Constitutional, HEENT, cardiovascular, respiratory, GI, GU, musculoskeletal, neuro, psychiatric, endocrine, integumentary and hematologic systems were reviewed and are otherwise negative/unremarkable except for positive findings mentioned above in the HPI.   MEDICATIONS AT HOME:   Prior to Admission medications   Medication Sig Start Date End Date Taking? Authorizing Provider  acetaminophen  (TYLENOL ) 500 MG tablet Take 2 tablets (1,000 mg total) by mouth every 6 (six) hours as needed. 10/15/23 10/14/24  Clarine Ozell LABOR, MD  albuterol  (VENTOLIN  HFA) 108 (90 Base) MCG/ACT inhaler Inhale 2 puffs into the lungs every 6 (six) hours as needed for wheezing or shortness of breath. 06/16/23    Towana Small, FNP  aspirin  81 MG chewable tablet Chew 1 tablet (81 mg total) by mouth daily. 02/23/24   Towana Small, FNP  clopidogrel  (PLAVIX ) 75 MG tablet Take 1 tablet (75 mg total) by mouth daily. 02/15/24   Darron Deatrice LABOR, MD  isosorbide  mononitrate (IMDUR ) 30 MG 24 hr tablet Take 2 tablets (60 mg total) by mouth 2 (two) times daily. 02/23/24 05/23/24  Towana Small, FNP  meclizine  (ANTIVERT ) 25 MG tablet Take 2 tablets (50 mg total) by mouth 2 (two) times daily as needed for dizziness. 02/23/24   Towana Small, FNP  metFORMIN  (GLUCOPHAGE -XR) 500 MG 24 hr tablet Take 2 tablets (1,000 mg total) by mouth 2 (two) times daily with a meal. 10/20/23   Towana Small, FNP  metoprolol  tartrate (LOPRESSOR ) 50 MG tablet Take 1 tablet (50 mg total) by mouth 2 (two) times daily. 02/23/24   Butler, Kristina, FNP  nitroGLYCERIN  (NITROSTAT ) 0.4 MG SL tablet Place 1 tablet (0.4 mg total) under the tongue every 5 (five) minutes as needed for chest pain. 09/26/21  Gerard Frederick, NP  pantoprazole  (PROTONIX ) 20 MG tablet Take 2 tablets (40 mg total) by mouth daily. 02/23/24   Butler, Kristina, FNP  rosuvastatin  (CRESTOR ) 40 MG tablet Take 1 tablet by mouth once daily 02/23/24   Butler, Kristina, FNP  traZODone  (DESYREL ) 50 MG tablet Take 0.5-1 tablets (25-50 mg total) by mouth at bedtime as needed for sleep. 02/23/24   Towana Small, FNP      VITAL SIGNS:  Blood pressure (!) 142/87, pulse 86, temperature 97.8 F (36.6 C), temperature source Axillary, resp. rate 15, weight 91 kg, SpO2 93%.  PHYSICAL EXAMINATION:  Physical Exam  GENERAL:  63 y.o.-year-old patient lying in the bed with no acute distress.  EYES: Pupils equal, round, reactive to light and accommodation. No scleral icterus. Extraocular muscles intact.  HEENT: Head atraumatic, normocephalic. Oropharynx and nasopharynx clear.  NECK:  Supple, no jugular venous distention. No thyroid  enlargement, no tenderness.  LUNGS: Normal breath  sounds bilaterally, no wheezing, rales,rhonchi or crepitation. No use of accessory muscles of respiration.  CARDIOVASCULAR: Regular rate and rhythm, S1, S2 normal. No murmurs, rubs, or gallops.  ABDOMEN: Soft, nondistended, nontender. Bowel sounds present. No organomegaly or mass.  EXTREMITIES: No pedal edema, cyanosis, or clubbing.  NEUROLOGIC: Cranial nerves II through XII are intact. Muscle strength 5/5 in all extremities. Sensation intact. Gait not checked.  PSYCHIATRIC: The patient is alert and oriented x 3.  Normal affect and good eye contact. SKIN: No obvious rash, lesion, or ulcer.   LABORATORY PANEL:   CBC Recent Labs  Lab 04/27/24 2052  WBC 9.1  HGB 15.4  HCT 44.6  PLT 278   ------------------------------------------------------------------------------------------------------------------  Chemistries  Recent Labs  Lab 04/27/24 2052  NA 139  K 4.1  CL 101  CO2 25  GLUCOSE 145*  BUN 10  CREATININE 1.08  CALCIUM  9.3  AST 23  ALT 20  ALKPHOS 61  BILITOT 1.0   ------------------------------------------------------------------------------------------------------------------  Cardiac Enzymes No results for input(s): TROPONINI in the last 168 hours. ------------------------------------------------------------------------------------------------------------------  RADIOLOGY:  US  Abdomen Limited RUQ (LIVER/GB) Result Date: 04/27/2024 EXAM: Right Upper Quadrant Abdominal Ultrasound 04/27/2024 10:10:00 PM TECHNIQUE: Real-time ultrasonography of the right upper quadrant of the abdomen was performed. COMPARISON: 12/27/2021 and CT 06/09/2023. CLINICAL HISTORY: RUQ abdominal pain. FINDINGS: LIVER: Diffuse fatty infiltration of the liver. No intrahepatic biliary ductal dilatation. No evidence of mass. BILIARY SYSTEM: Several small layering gallstones measuring up to 4 mm. Sludge noted within the gallbladder. No wall thickening or sonographic cholelithiasis. Common bile duct  borderline dilated at 8 mm. The distal bile duct is not visualized due to overlying bowel gas. Common bile duct size is stable since prior CT. No pericholecystic fluid. OTHER: No right upper quadrant ascites. IMPRESSION: 1. Cholelithiasis without sonographic evidence of acute cholecystitis. 2. Borderline dilated common bile duct measuring 8 mm, unchanged from prior CT. Distal duct not visualized due to overlying bowel gas. 3. Diffuse hepatic steatosis. Electronically signed by: Franky Crease MD 04/27/2024 10:32 PM EST RP Workstation: [id number]      IMPRESSION AND PLAN:  Assessment and Plan: * Biliary colic - The patient will be admitted to a medical telemetry observation bed. - He will be kept NPO. - Will continue PPI therapy. - General Surgery consult will be obtained. - I notified Dr. Desiderio about the patient.  Essential hypertension - Will continue antihypertensive therapy.  Controlled type 2 diabetes mellitus without complication, without long-term current use of insulin  (HCC) - The patient will be placed on supplemental coverage with NovoLog . -  Will hold off metformin .  Coronary artery disease - Serial troponins will be followed. - Will continue Imdur  and beta-blocker therapy as well as statin therapy. - We will hold off aspirin  and Plavix  for now.  Dyslipidemia - Will continue statin therapy.       DVT prophylaxis: Lovenox .  Advanced Care Planning:  Code Status: full code.  Family Communication:  The plan of care was discussed in details with the patient (and family). I answered all questions. The patient agreed to proceed with the above mentioned plan. Further management will depend upon hospital course. Disposition Plan: Back to previous home environment Consults called: General Surgery. All the records are reviewed and case discussed with ED provider.  Status is: Observation  I certify that at the time of admission, it is my clinical judgment that the patient will  require hospital care extending less than 2 midnights.                            Dispo: The patient is from: Home              Anticipated d/c is to: Home              Patient currently is not medically stable to d/c.              Difficult to place patient: No  Madison DELENA Peaches M.D on 04/28/2024 at 5:48 AM  [facility]   From 7 PM-7 AM, contact night-coverage [email or web address]  CC: Primary care physician; Towana Small, FNP

## 2024-04-27 NOTE — ED Notes (Signed)
 Called CCMD to place pt on cental monitoring

## 2024-04-27 NOTE — ED Provider Notes (Signed)
 [facility] Provider Note    Event Date/Time   First MD Initiated Contact with Patient 04/27/24 2119     (approximate)   History   Abdominal Pain   HPI  Jeffery Sweeney is a 63 y.o. male with history of coronary artery disease who presents today with concern of epigastric abdominal pain and right upper quadrant abdominal pain.  Symptoms started briefly prior to arrival here about 1 hour after he ate dinner.  Described as sharp pain limited primarily to the epigastrium and the right upper quadrant of the abdomen.  No affiliated nausea vomiting fevers chills.  No history of similar pain before in the past.  Denies associated diarrhea constipation.  He was given some oxycodone  while in the waiting room without much relief of the symptoms.  He does endorse a history of a CABG in the past, denies any abdominal surgical history.  He endorses high blood pressure history but no other medical history.  Endorses tobacco use denies alcohol or illicit substance use.     Physical Exam   Triage Vital Signs: ED Triage Vitals [04/27/24 2049]  Encounter Vitals Group     BP (!) 124/102     Girls Systolic BP Percentile      Girls Diastolic BP Percentile      Boys Systolic BP Percentile      Boys Diastolic BP Percentile      Pulse Rate 73     Resp (!) 22     Temp 98 F (36.7 C)     Temp Source Oral     SpO2 95 %     Weight      Height      Head Circumference      Peak Flow      Pain Score 10     Pain Loc      Pain Education      Exclude from Growth Chart     Most recent vital signs: Vitals:   04/27/24 2049  BP: (!) 124/102  Pulse: 73  Resp: (!) 22  Temp: 98 F (36.7 C)  SpO2: 95%     General: Awake, appears very uncomfortable CV:  Good peripheral perfusion.  Resp:  Normal effort.  Abd:  No distention.  Taut, with pain primarily in the right upper quadrant and the epigastrium Other:     ED Results / Procedures / Treatments   Labs (all labs  ordered are listed, but only abnormal results are displayed) Labs Reviewed  COMPREHENSIVE METABOLIC PANEL WITH GFR - Abnormal; Notable for the following components:      Result Value   Glucose, Bld 145 (*)    All other components within normal limits  LIPASE, BLOOD  CBC  URINALYSIS, ROUTINE W REFLEX MICROSCOPIC  TROPONIN T, HIGH SENSITIVITY     EKG  Sinus rhythm with rate of about 80, axis of about 70, intervals appear to be within normal limits, no obvious ischemia noted on this EKG   RADIOLOGY   PROCEDURES:  Critical Care performed: No  Procedures   MEDICATIONS ORDERED IN ED: Medications  oxyCODONE -acetaminophen  (PERCOCET/ROXICET) 5-325 MG per tablet 1 tablet (1 tablet Oral Given 04/27/24 2052)  HYDROmorphone  (DILAUDID ) injection 0.5 mg (has no administration in time range)  morphine  (PF) 4 MG/ML injection 4 mg (4 mg Intravenous Given 04/27/24 2134)  ondansetron  (ZOFRAN ) injection 4 mg (4 mg Intravenous Given 04/27/24 2134)  sodium chloride  0.9 % bolus 500 mL (500 mLs Intravenous New Bag/Given 04/27/24  2137)  morphine  (PF) 4 MG/ML injection 4 mg (4 mg Intravenous Given 04/27/24 2217)     IMPRESSION / MDM / ASSESSMENT AND PLAN / ED COURSE  I reviewed the triage vital signs and the nursing notes.                               Patient's presentation is most consistent with acute presentation with potential threat to life or bodily function.  63 year old male who presents today with concern of epigastric and right upper quadrant abdominal pain.  He appears very uncomfortable on exam, his vitals here are fortunately reassuring he does not have noted leukocytosis.   Clinical Course as of 04/27/24 2253  Wed Apr 27, 2024  2212 Patient continues with significant pain, his labs are fortunately reassuring still waiting imaging results.  Given another dose of morphine  here, if ultrasound is without clear explanation may warrant CT imaging. [SK]  2236 Patient continues  laying in stretcher appears to be very uncomfortable.  Ultrasound reveals biliary sludge without evidence of acute cholecystitis.  I suspect likely biliary colic, however given the severity of his pain, likely reasonable for admission for pain control and possible surgical evaluation in the morning.  I will reach out to medicine. [SK]  2253 Spoke with Dr. Lawence who has agreed to evaluate the patient to determine course of further medical management. [SK]    Clinical Course User Index [SK] Fernand Rossie HERO, MD     FINAL CLINICAL IMPRESSION(S) / ED DIAGNOSES   Final diagnoses:  Biliary colic     Rx / DC Orders   ED Discharge Orders     None        Note:  This document was prepared using Dragon voice recognition software and may include unintentional dictation errors.   Fernand Rossie HERO, MD 04/27/24 [phone]

## 2024-04-27 NOTE — ED Notes (Signed)
 Ultrasound tech at bedside

## 2024-04-27 NOTE — ED Triage Notes (Signed)
 Pt arrives via EMS from home c/o upper abd pain described as stabbing sensation that started 1 hr pta after eating; denies n/v.

## 2024-04-28 ENCOUNTER — Observation Stay: Payer: MEDICAID

## 2024-04-28 DIAGNOSIS — E119 Type 2 diabetes mellitus without complications: Secondary | ICD-10-CM

## 2024-04-28 DIAGNOSIS — I10 Essential (primary) hypertension: Secondary | ICD-10-CM | POA: Insufficient documentation

## 2024-04-28 DIAGNOSIS — I2510 Atherosclerotic heart disease of native coronary artery without angina pectoris: Secondary | ICD-10-CM | POA: Insufficient documentation

## 2024-04-28 DIAGNOSIS — K8020 Calculus of gallbladder without cholecystitis without obstruction: Secondary | ICD-10-CM

## 2024-04-28 DIAGNOSIS — E785 Hyperlipidemia, unspecified: Secondary | ICD-10-CM | POA: Insufficient documentation

## 2024-04-28 LAB — CBC
HCT: 43.1 % (ref 39.0–52.0)
Hemoglobin: 14.6 g/dL (ref 13.0–17.0)
MCH: 30.1 pg (ref 26.0–34.0)
MCHC: 33.9 g/dL (ref 30.0–36.0)
MCV: 88.9 fL (ref 80.0–100.0)
Platelets: 224 K/uL (ref 150–400)
RBC: 4.85 MIL/uL (ref 4.22–5.81)
RDW: 13.2 % (ref 11.5–15.5)
WBC: 14.1 K/uL — ABNORMAL HIGH (ref 4.0–10.5)
nRBC: 0 % (ref 0.0–0.2)

## 2024-04-28 LAB — HIV ANTIBODY (ROUTINE TESTING W REFLEX): HIV Screen 4th Generation wRfx: NONREACTIVE

## 2024-04-28 LAB — COMPREHENSIVE METABOLIC PANEL WITH GFR
ALT: 18 U/L (ref 0–44)
AST: 23 U/L (ref 15–41)
Albumin: 4 g/dL (ref 3.5–5.0)
Alkaline Phosphatase: 55 U/L (ref 38–126)
Anion gap: 13 (ref 5–15)
BUN: 12 mg/dL (ref 8–23)
CO2: 24 mmol/L (ref 22–32)
Calcium: 9.2 mg/dL (ref 8.9–10.3)
Chloride: 102 mmol/L (ref 98–111)
Creatinine, Ser: 0.96 mg/dL (ref 0.61–1.24)
GFR, Estimated: >60 mL/min (ref >60)
Glucose, Bld: 156 mg/dL — ABNORMAL HIGH (ref 70–99)
Potassium: 4.4 mmol/L (ref 3.5–5.1)
Sodium: 138 mmol/L (ref 135–145)
Total Bilirubin: 1.1 mg/dL (ref 0.0–1.2)
Total Protein: 7 g/dL (ref 6.5–8.1)

## 2024-04-28 LAB — TROPONIN T, HIGH SENSITIVITY: Troponin T High Sensitivity: <15 ng/L (ref 0–19)

## 2024-04-28 LAB — PROTIME-INR
INR: 1.1 (ref 0.8–1.2)
Prothrombin Time: 14.8 s (ref 11.4–15.2)

## 2024-04-28 MED ORDER — ISOSORBIDE MONONITRATE ER 30 MG PO TB24
60.0000 mg | ORAL_TABLET | Freq: Two times a day (BID) | ORAL | Status: DC
  Administered 2024-04-28 – 2024-05-03 (×9): 60 mg via ORAL
  Filled 2024-04-28 (×9): qty 2
  Filled 2024-04-28: qty 1

## 2024-04-28 MED ORDER — MORPHINE SULFATE (PF) 4 MG/ML IV SOLN: 3.0000 mg | Freq: Once | INTRAVENOUS | Status: DC

## 2024-04-28 MED ORDER — TRAZODONE HCL 50 MG PO TABS
25.0000 mg | ORAL_TABLET | Freq: Every evening | ORAL | Status: DC | PRN
  Administered 2024-05-03: 01:00:00 50 mg via ORAL
  Filled 2024-04-28: qty 1

## 2024-04-28 MED ORDER — PANTOPRAZOLE SODIUM 40 MG PO TBEC
40.0000 mg | DELAYED_RELEASE_TABLET | Freq: Every day | ORAL | Status: DC
  Administered 2024-04-30 – 2024-05-03 (×4): 40 mg via ORAL
  Filled 2024-04-28 (×5): qty 1

## 2024-04-28 MED ORDER — NITROGLYCERIN 0.4 MG SL SUBL: 0.4000 mg | SUBLINGUAL_TABLET | SUBLINGUAL | Status: DC | PRN

## 2024-04-28 MED ORDER — ALBUTEROL SULFATE (2.5 MG/3ML) 0.083% IN NEBU
2.5000 mg | INHALATION_SOLUTION | Freq: Four times a day (QID) | RESPIRATORY_TRACT | Status: DC | PRN
  Administered 2024-05-01 – 2024-05-02 (×2): 2.5 mg via RESPIRATORY_TRACT
  Filled 2024-04-28 (×2): qty 3

## 2024-04-28 MED ORDER — ENOXAPARIN SODIUM 40 MG/0.4ML IJ SOSY
40.0000 mg | PREFILLED_SYRINGE | INTRAMUSCULAR | Status: DC
  Administered 2024-04-30 – 2024-05-03 (×4): 40 mg via SUBCUTANEOUS
  Filled 2024-04-28 (×5): qty 0.4

## 2024-04-28 MED ORDER — TECHNETIUM TC 99M MEBROFENIN IV KIT
5.3800 | PACK | Freq: Once | INTRAVENOUS | Status: AC | PRN
  Administered 2024-04-28: 5.38 via INTRAVENOUS

## 2024-04-28 MED ORDER — PIPERACILLIN-TAZOBACTAM 3.375 G IVPB
3.3750 g | Freq: Three times a day (TID) | INTRAVENOUS | Status: DC
  Administered 2024-04-28 – 2024-05-03 (×15): 3.375 g via INTRAVENOUS
  Filled 2024-04-28 (×16): qty 50

## 2024-04-28 MED ORDER — MECLIZINE HCL 25 MG PO TABS: 50.0000 mg | ORAL_TABLET | Freq: Two times a day (BID) | ORAL | Status: DC | PRN

## 2024-04-28 MED ORDER — METOPROLOL TARTRATE 50 MG PO TABS
50.0000 mg | ORAL_TABLET | Freq: Two times a day (BID) | ORAL | Status: DC
  Administered 2024-04-28 – 2024-05-03 (×9): 50 mg via ORAL
  Filled 2024-04-28 (×10): qty 1

## 2024-04-28 MED ORDER — ROSUVASTATIN CALCIUM 10 MG PO TABS
40.0000 mg | ORAL_TABLET | Freq: Every day | ORAL | Status: DC
  Administered 2024-04-30 – 2024-05-03 (×4): 40 mg via ORAL
  Filled 2024-04-28 (×2): qty 2
  Filled 2024-04-28 (×4): qty 4

## 2024-04-28 MED ORDER — SODIUM CHLORIDE 0.9 % IV SOLN: INTRAVENOUS | Status: AC

## 2024-04-28 MED ORDER — MORPHINE SULFATE (PF) 2 MG/ML IV SOLN
2.0000 mg | INTRAVENOUS | Status: DC | PRN
  Administered 2024-04-28 – 2024-04-29 (×3): 2 mg via INTRAVENOUS
  Filled 2024-04-28 (×2): qty 1

## 2024-04-28 NOTE — Consult Note (Signed)
 [facility] SURGICAL CONSULTATION NOTE (initial) - cpt: 00746   HISTORY OF PRESENT ILLNESS (HPI):  63 y.o. male presented to [facility] ED yesterday for evaluation of abdominal pain. Patient reports the acute onset of RUQ abdominal pain radiating to his epigastrium which started about an hour after eating dinner last night. Felt like some one was stabbing him with a knife. No fever, chills, nausea, emesis, CP, SOB, urinary changes, or bowel changes. No history of similar post-prandial pains in the past.No previous abdominal surgeries. Of note, he has a history MI in the past requiring PCI, also with history of CABG. He is on Plavix  and ASA. Work up in the ED revealed a WBC 9.1K (now 14.1K), Hgb to 15.4, sCr 1.08, bilirubin 1.0, lipase normal at 24. He did have RUQ US  which showed cholelithiasis without gross changes of cholecystitis. He was admitted to the medicine service   This morning, he continues to report significant abdominal pain in the RUQ markedly worse with palpation. No fever. Again, now with leukocytosis to 14.1K.   Surgery is consulted by Hospitalist physician Dr. Madison Peaches, MD in this context for evaluation and management of possible cholecystitis.  PAST MEDICAL HISTORY (PMH):  Past Medical History:  Diagnosis Date   CVA (cerebral vascular accident) (HCC) 06/2021   Small acute/subacute infarcts in the left occipital lobe and   Hyperlipidemia LDL goal <70    started on Repatha  09/26/2021, continue Crestor  40mg  daily   Hypertension    Multiple vessel coronary artery disease    Myocardial infarct (HCC)    x 3; PCI with single stent placement for each (in [place] )   NSTEMI (non-ST elevated myocardial infarction) (HCC)    LHC 2/18 with multivessel CAD and transfer to [facility] for CABG eval   STEMI (ST elevation myocardial infarction) (HCC) 09/23/2021   LHC 09/23/2021 x 2   Tobacco use    2 cigarettes daily   Unstable angina (HCC)    LHC 09/23/2021 x 2; total 8 DES to LIMA      PAST SURGICAL HISTORY (PSH):  Past Surgical History:  Procedure Laterality Date   CORONARY ANGIOPLASTY WITH STENT PLACEMENT     in [place]  in 2018-19   CORONARY ARTERY BYPASS GRAFT N/A 07/09/2020   Procedure: CORONARY ARTERY BYPASS GRAFTING (CABG) x THREE , USING LEFT INTERNAL MAMMARY ARTERY, AND RIGHT LEG GREATER SAPHENOUS VEIN HARVESTED ENDOSCOPICALLY;  Surgeon: Army Dallas NOVAK, MD;  Location: [facility] OR;  Service: Open Heart Surgery;  Laterality: N/A;   CORONARY PRESSURE/FFR STUDY N/A 07/06/2020   Procedure: INTRAVASCULAR PRESSURE WIRE/FFR STUDY;  Surgeon: Darron Deatrice LABOR, MD;  Location: [facility] INVASIVE CV LAB;  Service: Cardiovascular;  Laterality: N/A;  LAD   CORONARY STENT INTERVENTION N/A 09/23/2021   Procedure: CORONARY STENT INTERVENTION;  Surgeon: Darron Deatrice LABOR, MD;  Location: [facility] INVASIVE CV LAB;  Service: Cardiovascular;  Laterality: N/A;   CORONARY/GRAFT ACUTE MI REVASCULARIZATION N/A 09/23/2021   Procedure: Coronary/Graft Acute MI Revascularization;  Surgeon: Darron Deatrice LABOR, MD;  Location: [facility] INVASIVE CV LAB;  Service: Cardiovascular;  Laterality: N/A;   LEFT HEART CATH AND CORONARY ANGIOGRAPHY N/A 07/06/2020   Procedure: LEFT HEART CATH AND CORONARY ANGIOGRAPHY;  Surgeon: Darron Deatrice LABOR, MD;  Location: [facility] INVASIVE CV LAB;  Service: Cardiovascular;  Laterality: N/A;   LEFT HEART CATH AND CORONARY ANGIOGRAPHY N/A 09/23/2021   Procedure: LEFT HEART CATH AND CORONARY ANGIOGRAPHY;  Surgeon: Darron Deatrice LABOR, MD;  Location: [facility] INVASIVE CV LAB;  Service: Cardiovascular;  Laterality: N/A;  LEFT HEART CATH AND CORS/GRAFTS ANGIOGRAPHY N/A 09/23/2021   Procedure: LEFT HEART CATH AND CORS/GRAFTS ANGIOGRAPHY;  Surgeon: Darron Deatrice LABOR, MD;  Location: [facility] INVASIVE CV LAB;  Service: Cardiovascular;  Laterality: N/A;   TEE WITHOUT CARDIOVERSION N/A 07/09/2020   Procedure: TRANSESOPHAGEAL ECHOCARDIOGRAM (TEE);  Surgeon: Army Dallas NOVAK, MD;  Location: [facility] OR;  Service: Open  Heart Surgery;  Laterality: N/A;   WRIST SURGERY Left    for cyst     MEDICATIONS:  Prior to Admission medications  Medication Sig Start Date End Date Taking? Authorizing Provider  acetaminophen  (TYLENOL ) 500 MG tablet Take 2 tablets (1,000 mg total) by mouth every 6 (six) hours as needed. 10/15/23 10/14/24 Yes Clarine Ozell LABOR, MD  albuterol  (VENTOLIN  HFA) 108 (90 Base) MCG/ACT inhaler Inhale 2 puffs into the lungs every 6 (six) hours as needed for wheezing or shortness of breath. 06/16/23  Yes Towana Small, FNP  aspirin  81 MG chewable tablet Chew 1 tablet (81 mg total) by mouth daily. 02/23/24  Yes Towana Small, FNP  clopidogrel  (PLAVIX ) 75 MG tablet Take 1 tablet (75 mg total) by mouth daily. 02/15/24  Yes Darron Deatrice LABOR, MD  isosorbide  mononitrate (IMDUR ) 30 MG 24 hr tablet Take 2 tablets (60 mg total) by mouth 2 (two) times daily. 02/23/24 05/23/24 Yes Towana Small, FNP  meclizine  (ANTIVERT ) 25 MG tablet Take 2 tablets (50 mg total) by mouth 2 (two) times daily as needed for dizziness. 02/23/24  Yes Towana Small, FNP  metFORMIN  (GLUCOPHAGE -XR) 500 MG 24 hr tablet Take 2 tablets (1,000 mg total) by mouth 2 (two) times daily with a meal. 10/20/23  Yes Towana Small, FNP  metoprolol  tartrate (LOPRESSOR ) 50 MG tablet Take 1 tablet (50 mg total) by mouth 2 (two) times daily. 02/23/24  Yes Towana Small, FNP  nitroGLYCERIN  (NITROSTAT ) 0.4 MG SL tablet Place 1 tablet (0.4 mg total) under the tongue every 5 (five) minutes as needed for chest pain. 09/26/21  Yes Hammock, Sheri, NP  pantoprazole  (PROTONIX ) 20 MG tablet Take 2 tablets (40 mg total) by mouth daily. 02/23/24  Yes Towana Small, FNP  rosuvastatin  (CRESTOR ) 40 MG tablet Take 1 tablet by mouth once daily 02/23/24  Yes Butler, Kristina, FNP  traZODone  (DESYREL ) 50 MG tablet Take 0.5-1 tablets (25-50 mg total) by mouth at bedtime as needed for sleep. 02/23/24  Yes Towana Small, FNP     ALLERGIES:  Allergies[1]   SOCIAL  HISTORY:  Social History   Socioeconomic History   Marital status: Divorced    Spouse name: Not on file   Number of children: Not on file   Years of education: Not on file   Highest education level: Not on file  Occupational History   Not on file  Tobacco Use   Smoking status: Some Days    Current packs/day: 0.00    Average packs/day: 0.5 packs/day    Types: Cigarettes    Last attempt to quit: 07/06/2020    Years since quitting: 3.8    Passive exposure: Past   Smokeless tobacco: Never   Tobacco comments:    [employer] info given. 11/17/23  Vaping Use   Vaping status: Never Used  Substance and Sexual Activity   Alcohol use: Not Currently   Drug use: Not Currently   Sexual activity: Not Currently  Other Topics Concern   Not on file  Social History Narrative   Not on file   Social Drivers of Health   Tobacco Use: High Risk (04/27/2024)   Patient  History    Smoking Tobacco Use: Some Days    Smokeless Tobacco Use: Never    Passive Exposure: Past  Financial Resource Strain: Not on file  Food Insecurity: Food Insecurity Present (06/24/2023)   Hunger Vital Sign    Worried About Running Out of Food in the Last Year: Never true    Ran Out of Food in the Last Year: Sometimes true  Transportation Needs: No Transportation Needs (06/24/2023)   PRAPARE - Administrator, Civil Service (Medical): No    Lack of Transportation (Non-Medical): No  Physical Activity: Not on file  Stress: Not on file  Social Connections: Not on file  Intimate Partner Violence: Not At Risk (06/24/2023)   Humiliation, Afraid, Rape, and Kick questionnaire    Fear of Current or Ex-Partner: No    Emotionally Abused: No    Physically Abused: No    Sexually Abused: No  Depression (PHQ2-9): Low Risk (02/23/2024)   Depression (PHQ2-9)    PHQ-2 Score: 4  Alcohol Screen: Not on file  Housing: Low Risk (06/24/2023)   Housing Stability Vital Sign    Unable to Pay for Housing in the Last Year: No    Number of  Times Moved in the Last Year: 1    Homeless in the Last Year: No  Utilities: Not At Risk (06/24/2023)   AHC Utilities    Threatened with loss of utilities: No  Health Literacy: Not on file     FAMILY HISTORY:  Family History  Problem Relation Age of Onset   Diabetes Mellitus II Father       REVIEW OF SYSTEMS:  Review of Systems  Constitutional:  Negative for chills and fever.  Respiratory:  Negative for cough and shortness of breath.   Cardiovascular:  Negative for chest pain and palpitations.  Gastrointestinal:  Positive for nausea. Negative for abdominal pain, constipation, diarrhea and vomiting.  Genitourinary:  Negative for dysuria and urgency.  All other systems reviewed and are negative.   VITAL SIGNS:  Temp:  [97.8 F (36.6 C)-98 F (36.7 C)] 97.8 F (36.6 C) (12/11 0300) Pulse Rate:  [72-92] 89 (12/11 0530) Resp:  [13-22] 14 (12/11 0530) BP: (124-173)/(76-102) 133/84 (12/11 0530) SpO2:  [91 %-95 %] 92 % (12/11 0530) Weight:  [91 kg] 91 kg (12/10 2316)       Weight: 91 kg     INTAKE/OUTPUT:  12/10 0701 - 12/11 0700 In: 500 [IV Piggyback:500] Out: -   PHYSICAL EXAM:  Physical Exam Vitals and nursing note reviewed. Exam conducted with a chaperone present.  Constitutional:      General: He is not in acute distress.    Appearance: He is well-developed. He is not ill-appearing.     Comments: Resting in bed; NAD  HENT:     Head: Normocephalic and atraumatic.  Eyes:     General: No scleral icterus.    Extraocular Movements: Extraocular movements intact.  Cardiovascular:     Rate and Rhythm: Normal rate.     Heart sounds: Normal heart sounds. No murmur heard. Pulmonary:     Effort: Pulmonary effort is normal. No respiratory distress.  Chest:     Comments: Sternotomy scar noted  Abdominal:     General: Abdomen is flat. There is no distension.     Palpations: Abdomen is soft.     Tenderness: There is abdominal tenderness in the right upper quadrant and  epigastric area. There is no guarding or rebound. Positive signs include Murphy's sign.  Genitourinary:    Comments: Deferred Skin:    General: Skin is warm and dry.     Coloration: Skin is not jaundiced.  Neurological:     General: No focal deficit present.     Mental Status: He is alert and oriented to person, place, and time.  Psychiatric:        Mood and Affect: Mood normal.        Behavior: Behavior normal.      Labs:     Latest Ref Rng & Units 04/28/2024    6:09 AM 04/27/2024    8:52 PM 11/30/2023    2:02 PM  CBC  WBC 4.0 - 10.5 K/uL 14.1  9.1  8.0   Hemoglobin 13.0 - 17.0 g/dL 85.3  84.5  85.2   Hematocrit 39.0 - 52.0 % 43.1  44.6  43.7   Platelets 150 - 400 K/uL 224  278  232       Latest Ref Rng & Units 04/28/2024    6:09 AM 04/27/2024    8:52 PM 11/30/2023    2:02 PM  CMP  Glucose 70 - 99 mg/dL 843  854  837   BUN 8 - 23 mg/dL 12  10  10    Creatinine 0.61 - 1.24 mg/dL 9.03  8.91  9.25   Sodium 135 - 145 mmol/L 138  139  140   Potassium 3.5 - 5.1 mmol/L 4.4  4.1  3.7   Chloride 98 - 111 mmol/L 102  101  110   CO2 22 - 32 mmol/L 24  25  21    Calcium  8.9 - 10.3 mg/dL 9.2  9.3  8.8   Total Protein 6.5 - 8.1 g/dL 7.0  7.6    Total Bilirubin 0.0 - 1.2 mg/dL 1.1  1.0    Alkaline Phos 38 - 126 U/L 55  61    AST 15 - 41 U/L 23  23    ALT 0 - 44 U/L 18  20      Imaging studies:   RUQ US  (04/27/2024) personally reviewed with stone but no definitive evidence of cholecystitis, and radiologist report reviewed below:  IMPRESSION: 1. Cholelithiasis without sonographic evidence of acute cholecystitis. 2. Borderline dilated common bile duct measuring 8 mm, unchanged from prior CT. Distal duct not visualized due to overlying bowel gas. 3. Diffuse hepatic steatosis.   Assessment/Plan:  63 y.o. male with cholelithiasis without significant radiographic changes to suggest cholecystitis although with RUQ abdominal pain and leukocytosis, complicated by significant cardiac  history, need for Plavix     - Appreciate medicine admission  - RUQ US  was equivocal for cholecystitis; however, patient certainly with marked RUQ pain on exam and now with leukocytosis overall concerning for cholecystitis. He is a very sub-optimal surgical candidate in this acute setting given his cardiac history and need for anticoagulation. I did review case with IR for possible percutaneous cholecystostomy tube placement and recommended HIDA first, which I have ordered  - Patient will need to continue to be NPO  - I have added Abx (Zosyn)  - Monitor abdominal examination  - Pain control prn; Will need to hold narcotics prior to HIDA   - Monitor leukocytosis - Further management per primary service; we will follow   All of the above findings and recommendations were discussed with the patient, and all of patient's questions were answered to his expressed satisfaction.  Thank you for the opportunity to participate in this patient's care.   -- Roma Bierlein, PA-C [facility] 04/28/2024, 9:40 AM M-F: 7am - 4pm     [1] No Known Allergies

## 2024-04-28 NOTE — Assessment & Plan Note (Signed)
-   The patient will be placed on supplemental coverage with NovoLog . - Will hold off metformin .  12/12: Insulin  SSI with at bedtime coverage ordered

## 2024-04-28 NOTE — Assessment & Plan Note (Signed)
-   Serial troponins will be followed. - Will continue Imdur  and beta-blocker therapy as well as statin therapy. - We will hold off aspirin  and Plavix  for now.  12/12: Aspirin  and Plavix  resumed for 12/13.

## 2024-04-28 NOTE — Progress Notes (Signed)
 PROGRESS NOTE    Berdell Hostetler  FMW:[id number]  DOB: [birth year]  DOA: 04/27/2024 PCP: Towana Small, FNP Outpatient Specialists:   Hospital course:  63 year old man with HTN, CAD, CVA s/p PCI, ongoing tobacco use presented with acute onset of epigastric and RUQ abdominal pain without associated nausea vomiting or diarrhea.  RUQ US  revealed Cholelithiasis without sonographic evidence of acute cholecystitis and borderline dilated CBD at 8 mm unchanged from prior CT.  Patient was seen by general surgery who recommended HIDA scan and admission to [facility].  Subjective:  Patient states he still has significant epigastric pain.  Notes it hurts every time he moves.  Pain medication is marginally helpful.  No nausea or vomiting, no appetite.  Denies any alcohol use.   Objective: Vitals:   04/28/24 0500 04/28/24 0530 04/28/24 1013 04/28/24 1558  BP: 131/89 133/84 (!) 142/84 128/75  Pulse: 89 89 91 (!) 109  Resp: 13 14 18 17   Temp:   99.4 F (37.4 C) 99.2 F (37.3 C)  TempSrc:   Oral   SpO2: 93% 92% 92% 91%  Weight:        Intake/Output Summary (Last 24 hours) at 04/28/2024 1620 Last data filed at 04/27/2024 2317 Gross per 24 hour  Intake 500 ml  Output --  Net 500 ml   Filed Weights   04/27/24 2316  Weight: 91 kg     Exam:  General: Obese patient lying on his left side without respiratory discomfort Eyes: sclera anicteric, conjuctiva mild injection bilaterally CVS: S1-S2, regular  Respiratory:  decreased air entry bilaterally secondary to decreased inspiratory effort GI: Protuberant, exquisitely tender to light palpation in epigastric region, + rebound tenderness, I am unable to elicit a Murphy sign, no voluntary guarding. LE: Warm and well-perfused Neuro: A/O x 3,  grossly nonfocal.  Psych: patient is logical and coherent, judgement and insight appear normal, mood and affect appropriate to situation.  Data Reviewed:  Basic Metabolic Panel: Recent Labs   Lab 04/27/24 2052 04/28/24 0609  NA 139 138  K 4.1 4.4  CL 101 102  CO2 25 24  GLUCOSE 145* 156*  BUN 10 12  CREATININE 1.08 0.96  CALCIUM  9.3 9.2    CBC: Recent Labs  Lab 04/27/24 2052 04/28/24 0609  WBC 9.1 14.1*  HGB 15.4 14.6  HCT 44.6 43.1  MCV 87.1 88.9  PLT 278 224     Scheduled Meds:  enoxaparin  (LOVENOX ) injection  40 mg Subcutaneous Q24H   isosorbide  mononitrate  60 mg Oral BID   metoprolol  tartrate  50 mg Oral BID    morphine  injection  3 mg Intravenous Once   pantoprazole   40 mg Oral Daily   rosuvastatin   40 mg Oral Daily   Continuous Infusions:  sodium chloride  100 mL/hr at 04/28/24 0946   sodium chloride      piperacillin-tazobactam (ZOSYN)  IV Stopped (04/28/24 1617)     Assessment & Plan:   Acute RUQ/epigastric pain with cholelithiasis seen on ultrasound Acute cholecystitis RUQ US  with cholelithiasis without cholecystitis Patient seen by general surgery who recommend HIDA scan HIDA scan done today shows nonfilling of the gallbladder consistent with cystic duct obstruction and acute cholecystitis General Surgery notes that given ongoing aspirin  Plavix  and history of CAD he would be high risk for surgery, plan is for percutaneous chole tomorrow by IR Continue Zosyn Continue pain management Lipase is WNL at 26  CAD HTN Aspirin  and Plavix  are being held pending instrumentation tomorrow Would restart aspirin  and Plavix   as soon as it is safe to do so Continue isosorbide , metoprolol  and rosuvastatin   DM 2 Reasonably controlled on present regimen    DVT prophylaxis: Lovenox  Code Status: Full  Family Communication: None today     Studies: NM Hepatobiliary Liver Func Result Date: 04/28/2024 EXAM: NM HEPATOBILLARY SCAN 04/28/2024 03:49:19 PM TECHNIQUE: RADIOPHARMACEUTICAL: 5.38 mCi Tc-54m mebrofenin  Dynamic images of the abdomen and pelvis were obtained in the anterior projection for 1 hour after intravenous administration of  radiopharmaceutical. 3 mg IV morphine  was administered and imaging continued for at least 30 minutes. COMPARISON: Ultrasound. CLINICAL HISTORY: RUQ abdominal pain, biliary disease suspected, US  nondiagnostic. FINDINGS: Homogenous uptake within the liver. Normal clearance of the blood pool. Appropriate excretion into the biliary system. Non-filling of the gallbladder at 60 minutes. The gallbladder failed to fill after morphine  augmentation. Activity is visualized in the small bowel. IMPRESSION: 1. Non-filling of the gallbladder after morphine  augmentation concerning for cystic duct obstruction / acute cholecystitis. 2. Patent common bile duct . 3. these results will be called to the ordering clinician or representative by the radiologist assistant, and communication documented in the pacs or clario dashboard Electronically signed by: Norleen Boxer MD 04/28/2024 03:57 PM EST RP Workstation: [id number]   US  Abdomen Limited RUQ (LIVER/GB) Result Date: 04/27/2024 EXAM: Right Upper Quadrant Abdominal Ultrasound 04/27/2024 10:10:00 PM TECHNIQUE: Real-time ultrasonography of the right upper quadrant of the abdomen was performed. COMPARISON: 12/27/2021 and CT 06/09/2023. CLINICAL HISTORY: RUQ abdominal pain. FINDINGS: LIVER: Diffuse fatty infiltration of the liver. No intrahepatic biliary ductal dilatation. No evidence of mass. BILIARY SYSTEM: Several small layering gallstones measuring up to 4 mm. Sludge noted within the gallbladder. No wall thickening or sonographic cholelithiasis. Common bile duct borderline dilated at 8 mm. The distal bile duct is not visualized due to overlying bowel gas. Common bile duct size is stable since prior CT. No pericholecystic fluid. OTHER: No right upper quadrant ascites. IMPRESSION: 1. Cholelithiasis without sonographic evidence of acute cholecystitis. 2. Borderline dilated common bile duct measuring 8 mm, unchanged from prior CT. Distal duct not visualized due to overlying bowel gas. 3.  Diffuse hepatic steatosis. Electronically signed by: Franky Crease MD 04/27/2024 10:32 PM EST RP Workstation: [id number]    Principal Problem:   Biliary colic Active Problems:   Dyslipidemia   Coronary artery disease   Controlled type 2 diabetes mellitus without complication, without long-term current use of insulin  (HCC)   Essential hypertension     Curvin Hunger Vangie Pike, [facility]  If 7PM-7AM, please contact night-coverage [email or web address]   LOS: 0 days

## 2024-04-28 NOTE — Assessment & Plan Note (Signed)
 Home metoprolol  50 mg p.o. twice daily, Imdur  60 mg p.o. twice daily  12/12: Hydralazine  5 mg IV every 6 hours as needed for SBP greater 170, 5 days ordered

## 2024-04-28 NOTE — Assessment & Plan Note (Addendum)
-   The patient will be admitted to a medical telemetry observation bed. - He will be kept NPO. - Will continue PPI therapy. - General Surgery consult will be obtained. - I notified Dr. Desiderio about the patient.  04/29/2024: Patient got IR drain today.  Per midlevel, patient did not responded well to the IR drain and required pain medication.  Patient then developed rigors and required Demerol  and this was given by interventional radiologist.  At bedside there is a drain placed and dressing appears to be clean with no active signs of bleeding at the dressing.  There is about 100 mL of bile dark fluid that has been drained. Continue Zosyn  per pharmacy at this time.  Patient will need to be taught how to flush the drain once he goes home.  Nursing is aware that they need to help patient/family with this education.  Per general surgery.  12/13: Morphine  2 mg IV every 4 hours as needed for moderate pain, 1 more day.  Morphine  4 mg IV every 4 hours as needed for severe pain, 1 more day.  Patient can be transition to oral pain medication tomorrow.  Continue Zosyn  per pharmacy.

## 2024-04-28 NOTE — Assessment & Plan Note (Signed)
-   Rosuvastatin 40 mg daily 

## 2024-04-28 NOTE — Consult Note (Incomplete)
 Chief Complaint: Patient was seen in consultation today for acute cholecystitis with positive HIDA scan, and with consideration for drainage.  Referring Provider(s): Mr. Arthea Platt, PA-C.  Supervising Physician: Hughes Simmonds  Patient Status: [facility] - Out-pt  Patient is Full Code  History of Present Illness: Jeffery Sweeney is a 63 y.o. male  with PMHx notable for HTN, HLD, CAD, MI, CVA, and others as delineated below.  Per Dr. Cary progress note dated 12/11: Acute RUQ/epigastric pain with cholelithiasis seen on ultrasound Acute cholecystitis RUQ US  with cholelithiasis without cholecystitis Patient seen by general surgery who recommend HIDA scan HIDA scan done today shows nonfilling of the gallbladder consistent with cystic duct obstruction and acute cholecystitis General Surgery notes that given ongoing aspirin  Plavix  and history of CAD he would be high risk for surgery, plan is for percutaneous chole tomorrow by IR Continue Zosyn Continue pain management Lipase is WNL at 26.    Interventional Radiology was requested for percutaneous cholecystostomy tube placement. Request was reviewed and approved by Dr. Jennefer. Patient is tentatively scheduled for same in IR today.   Patient is alert and laying in bed, calm.  Patient is currently complaining of abdominal pain, worst at liver border, and some nausea with mild headache. Patient denies any fevers, chest pain, SOB, cough, vomiting or bleeding.     Past Medical History:  Diagnosis Date   CVA (cerebral vascular accident) (HCC) 06/2021   Small acute/subacute infarcts in the left occipital lobe and   Hyperlipidemia LDL goal <70    started on Repatha  09/26/2021, continue Crestor  40mg  daily   Hypertension    Multiple vessel coronary artery disease    Myocardial infarct (HCC)    x 3; PCI with single stent placement for each (in [place] )   NSTEMI (non-ST elevated myocardial infarction) (HCC)    LHC 2/18  with multivessel CAD and transfer to [facility] for CABG eval   STEMI (ST elevation myocardial infarction) (HCC) 09/23/2021   LHC 09/23/2021 x 2   Tobacco use    2 cigarettes daily   Unstable angina (HCC)    LHC 09/23/2021 x 2; total 8 DES to LIMA    Past Surgical History:  Procedure Laterality Date   CORONARY ANGIOPLASTY WITH STENT PLACEMENT     in [place]  in 2018-19   CORONARY ARTERY BYPASS GRAFT N/A 07/09/2020   Procedure: CORONARY ARTERY BYPASS GRAFTING (CABG) x THREE , USING LEFT INTERNAL MAMMARY ARTERY, AND RIGHT LEG GREATER SAPHENOUS VEIN HARVESTED ENDOSCOPICALLY;  Surgeon: Army Dallas NOVAK, MD;  Location: [facility] OR;  Service: Open Heart Surgery;  Laterality: N/A;   CORONARY PRESSURE/FFR STUDY N/A 07/06/2020   Procedure: INTRAVASCULAR PRESSURE WIRE/FFR STUDY;  Surgeon: Darron Deatrice LABOR, MD;  Location: [facility] INVASIVE CV LAB;  Service: Cardiovascular;  Laterality: N/A;  LAD   CORONARY STENT INTERVENTION N/A 09/23/2021   Procedure: CORONARY STENT INTERVENTION;  Surgeon: Darron Deatrice LABOR, MD;  Location: [facility] INVASIVE CV LAB;  Service: Cardiovascular;  Laterality: N/A;   CORONARY/GRAFT ACUTE MI REVASCULARIZATION N/A 09/23/2021   Procedure: Coronary/Graft Acute MI Revascularization;  Surgeon: Darron Deatrice LABOR, MD;  Location: [facility] INVASIVE CV LAB;  Service: Cardiovascular;  Laterality: N/A;   LEFT HEART CATH AND CORONARY ANGIOGRAPHY N/A 07/06/2020   Procedure: LEFT HEART CATH AND CORONARY ANGIOGRAPHY;  Surgeon: Darron Deatrice LABOR, MD;  Location: [facility] INVASIVE CV LAB;  Service: Cardiovascular;  Laterality: N/A;   LEFT HEART CATH AND CORONARY ANGIOGRAPHY N/A 09/23/2021   Procedure: LEFT HEART CATH AND CORONARY  ANGIOGRAPHY;  Surgeon: Darron Deatrice LABOR, MD;  Location: [facility] INVASIVE CV LAB;  Service: Cardiovascular;  Laterality: N/A;   LEFT HEART CATH AND CORS/GRAFTS ANGIOGRAPHY N/A 09/23/2021   Procedure: LEFT HEART CATH AND CORS/GRAFTS ANGIOGRAPHY;  Surgeon: Darron Deatrice LABOR, MD;  Location: [facility] INVASIVE CV LAB;   Service: Cardiovascular;  Laterality: N/A;   TEE WITHOUT CARDIOVERSION N/A 07/09/2020   Procedure: TRANSESOPHAGEAL ECHOCARDIOGRAM (TEE);  Surgeon: Army Dallas NOVAK, MD;  Location: [facility] OR;  Service: Open Heart Surgery;  Laterality: N/A;   WRIST SURGERY Left    for cyst    Allergies: Patient has no known allergies.  Medications: Prior to Admission medications  Medication Sig Start Date End Date Taking? Authorizing Provider  acetaminophen  (TYLENOL ) 500 MG tablet Take 2 tablets (1,000 mg total) by mouth every 6 (six) hours as needed. 10/15/23 10/14/24 Yes Clarine Ozell LABOR, MD  albuterol  (VENTOLIN  HFA) 108 (90 Base) MCG/ACT inhaler Inhale 2 puffs into the lungs every 6 (six) hours as needed for wheezing or shortness of breath. 06/16/23  Yes Towana Small, FNP  aspirin  81 MG chewable tablet Chew 1 tablet (81 mg total) by mouth daily. 02/23/24  Yes Towana Small, FNP  clopidogrel  (PLAVIX ) 75 MG tablet Take 1 tablet (75 mg total) by mouth daily. 02/15/24  Yes Darron Deatrice LABOR, MD  isosorbide  mononitrate (IMDUR ) 30 MG 24 hr tablet Take 2 tablets (60 mg total) by mouth 2 (two) times daily. 02/23/24 05/23/24 Yes Towana Small, FNP  meclizine  (ANTIVERT ) 25 MG tablet Take 2 tablets (50 mg total) by mouth 2 (two) times daily as needed for dizziness. 02/23/24  Yes Towana Small, FNP  metFORMIN  (GLUCOPHAGE -XR) 500 MG 24 hr tablet Take 2 tablets (1,000 mg total) by mouth 2 (two) times daily with a meal. 10/20/23  Yes Towana Small, FNP  metoprolol  tartrate (LOPRESSOR ) 50 MG tablet Take 1 tablet (50 mg total) by mouth 2 (two) times daily. 02/23/24  Yes Towana Small, FNP  nitroGLYCERIN  (NITROSTAT ) 0.4 MG SL tablet Place 1 tablet (0.4 mg total) under the tongue every 5 (five) minutes as needed for chest pain. 09/26/21  Yes Hammock, Sheri, NP  pantoprazole  (PROTONIX ) 20 MG tablet Take 2 tablets (40 mg total) by mouth daily. 02/23/24  Yes Towana Small, FNP  rosuvastatin  (CRESTOR ) 40 MG tablet Take 1  tablet by mouth once daily 02/23/24  Yes Butler, Kristina, FNP  traZODone  (DESYREL ) 50 MG tablet Take 0.5-1 tablets (25-50 mg total) by mouth at bedtime as needed for sleep. 02/23/24  Yes Towana Small, FNP     Family History  Problem Relation Age of Onset   Diabetes Mellitus II Father     Social History   Socioeconomic History   Marital status: Divorced    Spouse name: Not on file   Number of children: Not on file   Years of education: Not on file   Highest education level: Not on file  Occupational History   Not on file  Tobacco Use   Smoking status: Some Days    Current packs/day: 0.00    Average packs/day: 0.5 packs/day    Types: Cigarettes    Last attempt to quit: 07/06/2020    Years since quitting: 3.8    Passive exposure: Past   Smokeless tobacco: Never   Tobacco comments:    [employer] info given. 11/17/23  Vaping Use   Vaping status: Never Used  Substance and Sexual Activity   Alcohol use: Not Currently   Drug use: Not Currently   Sexual activity:  Not Currently  Other Topics Concern   Not on file  Social History Narrative   Not on file   Social Drivers of Health   Tobacco Use: High Risk (04/27/2024)   Patient History    Smoking Tobacco Use: Some Days    Smokeless Tobacco Use: Never    Passive Exposure: Past  Financial Resource Strain: Not on file  Food Insecurity: No Food Insecurity (04/28/2024)   Epic    Worried About Programme Researcher, Broadcasting/film/video in the Last Year: Never true    Ran Out of Food in the Last Year: Never true  Transportation Needs: No Transportation Needs (04/28/2024)   Epic    Lack of Transportation (Medical): No    Lack of Transportation (Non-Medical): No  Physical Activity: Not on file  Stress: Not on file  Social Connections: Patient Declined (04/28/2024)   Social Connection and Isolation Panel    Frequency of Communication with Friends and Family: Patient declined    Frequency of Social Gatherings with Friends and Family: Patient declined     Attends Religious Services: Patient declined    Database Administrator or Organizations: Patient declined    Attends Banker Meetings: Patient declined    Marital Status: Patient declined  Depression (PHQ2-9): Low Risk (02/23/2024)   Depression (PHQ2-9)    PHQ-2 Score: 4  Alcohol Screen: Not on file  Housing: Unknown (04/28/2024)   Epic    Unable to Pay for Housing in the Last Year: No    Number of Times Moved in the Last Year: Not on file    Homeless in the Last Year: No  Utilities: Not At Risk (04/28/2024)   Epic    Threatened with loss of utilities: No  Health Literacy: Not on file     Review of Systems: A 12 point ROS discussed and pertinent positives are indicated in the HPI above.  All other systems are negative.  Vital Signs: BP 109/65 (BP Location: Right Arm)   Pulse 96   Temp 99.3 F (37.4 C) (Oral)   Resp 18   Wt 200 lb 9.6 oz (91 kg)   SpO2 92%   BMI 35.53 kg/m   Advance Care Plan: The advanced care place/surrogate decision maker was discussed at the time of visit and the patient did not wish to discuss or was not able to name a surrogate decision maker or provide an advance care plan.  Physical Exam Vitals reviewed.  Constitutional:      General: He is not in acute distress.    Appearance: Normal appearance.  HENT:     Mouth/Throat:     Mouth: Mucous membranes are dry.  Cardiovascular:     Rate and Rhythm: Normal rate and regular rhythm.  Pulmonary:     Effort: Pulmonary effort is normal.     Breath sounds: Normal breath sounds.  Abdominal:     General: Abdomen is flat. There is no distension.     Tenderness: There is abdominal tenderness in the right upper quadrant and epigastric area. Positive signs include Murphy's sign.  Musculoskeletal:        General: Normal range of motion.     Cervical back: Normal range of motion.  Skin:    General: Skin is warm and dry.  Neurological:     Mental Status: He is alert and oriented to person,  place, and time.  Psychiatric:        Mood and Affect: Mood normal.  Behavior: Behavior normal.        Thought Content: Thought content normal.        Judgment: Judgment normal.     Imaging: NM Hepatobiliary Liver Func Result Date: 04/28/2024 EXAM: NM HEPATOBILLARY SCAN 04/28/2024 03:49:19 PM TECHNIQUE: RADIOPHARMACEUTICAL: 5.38 mCi Tc-67m mebrofenin  Dynamic images of the abdomen and pelvis were obtained in the anterior projection for 1 hour after intravenous administration of radiopharmaceutical. 3 mg IV morphine  was administered and imaging continued for at least 30 minutes. COMPARISON: Ultrasound. CLINICAL HISTORY: RUQ abdominal pain, biliary disease suspected, US  nondiagnostic. FINDINGS: Homogenous uptake within the liver. Normal clearance of the blood pool. Appropriate excretion into the biliary system. Non-filling of the gallbladder at 60 minutes. The gallbladder failed to fill after morphine  augmentation. Activity is visualized in the small bowel. IMPRESSION: 1. Non-filling of the gallbladder after morphine  augmentation concerning for cystic duct obstruction / acute cholecystitis. 2. Patent common bile duct . 3. these results will be called to the ordering clinician or representative by the radiologist assistant, and communication documented in the pacs or clario dashboard Electronically signed by: Norleen Boxer MD 04/28/2024 03:57 PM EST RP Workstation: [id number]   US  Abdomen Limited RUQ (LIVER/GB) Result Date: 04/27/2024 EXAM: Right Upper Quadrant Abdominal Ultrasound 04/27/2024 10:10:00 PM TECHNIQUE: Real-time ultrasonography of the right upper quadrant of the abdomen was performed. COMPARISON: 12/27/2021 and CT 06/09/2023. CLINICAL HISTORY: RUQ abdominal pain. FINDINGS: LIVER: Diffuse fatty infiltration of the liver. No intrahepatic biliary ductal dilatation. No evidence of mass. BILIARY SYSTEM: Several small layering gallstones measuring up to 4 mm. Sludge noted within the  gallbladder. No wall thickening or sonographic cholelithiasis. Common bile duct borderline dilated at 8 mm. The distal bile duct is not visualized due to overlying bowel gas. Common bile duct size is stable since prior CT. No pericholecystic fluid. OTHER: No right upper quadrant ascites. IMPRESSION: 1. Cholelithiasis without sonographic evidence of acute cholecystitis. 2. Borderline dilated common bile duct measuring 8 mm, unchanged from prior CT. Distal duct not visualized due to overlying bowel gas. 3. Diffuse hepatic steatosis. Electronically signed by: Franky Crease MD 04/27/2024 10:32 PM EST RP Workstation: [id number]    Labs:  CBC: Recent Labs    11/30/23 1402 04/27/24 2052 04/28/24 0609 04/29/24 0522  WBC 8.0 9.1 14.1* 13.7*  HGB 14.7 15.4 14.6 13.2  HCT 43.7 44.6 43.1 39.4  PLT 232 278 224 193    COAGS: Recent Labs    04/28/24 1656  INR 1.1    BMP: Recent Labs    11/30/23 1402 04/27/24 2052 04/28/24 0609 04/29/24 0522  NA 140 139 138 137  K 3.7 4.1 4.4 4.1  CL 110 101 102 103  CO2 21* 25 24 25   GLUCOSE 162* 145* 156* 129*  BUN 10 10 12 14   CALCIUM  8.8* 9.3 9.2 8.9  CREATININE 0.74 1.08 0.96 1.04  GFRNONAA >60 >60 >60 >60    LIVER FUNCTION TESTS: Recent Labs    07/10/23 1141 04/27/24 2052 04/28/24 0609 04/29/24 0522  BILITOT 0.9 1.0 1.1 2.7*  AST 17 23 23 16   ALT 15 20 18 10   ALKPHOS 76 61 55 46  PROT 7.0 7.6 7.0 6.6  ALBUMIN  4.3 4.3 4.0 3.5    TUMOR MARKERS: No results for input(s): AFPTM, CEA, CA199, CHROMGRNA in the last 8760 hours.  Assessment and Plan: Per Dr. Cary progress note dated 12/11: Acute RUQ/epigastric pain with cholelithiasis seen on ultrasound Acute cholecystitis RUQ US  with cholelithiasis without cholecystitis Patient seen by general surgery who  recommend HIDA scan HIDA scan done today shows nonfilling of the gallbladder consistent with cystic duct obstruction and acute cholecystitis General Surgery notes that  given ongoing aspirin  Plavix  and history of CAD he would be high risk for surgery, plan is for percutaneous chole tomorrow by IR Continue Zosyn Continue pain management Lipase is WNL at 26.   Patient will present for tentatively scheduled percutaneous cholecystostomy tube placement in IR today.  Patient has been NPO since midnight.  All labs and medications are within acceptable parameters. Patient had his last dose of lovenox  held. No allergies on file.   Risks and benefits discussed with the patient including, but not limited to bleeding, infection, gallbladder perforation, bile leak, sepsis or even death.  All of the patient's questions were answered, patient is agreeable to proceed. Consent signed and in IR control room.    Thank you for allowing our service to participate in Arlin Sass 's care.  Electronically Signed: Carlin DELENA Griffon, PA-C   04/29/2024, 7:57 AM      I spent a total of 40 Minutes in face to face in clinical consultation, greater than 50% of which was counseling/coordinating care for acute cholecystitis, and with consideration for drainage.

## 2024-04-29 ENCOUNTER — Observation Stay: Payer: MEDICAID | Admitting: Radiology

## 2024-04-29 ENCOUNTER — Other Ambulatory Visit: Payer: Self-pay | Admitting: Interventional Radiology

## 2024-04-29 HISTORY — PX: IR PERC CHOLECYSTOSTOMY: IMG2326

## 2024-04-29 LAB — CBC
HCT: 39.4 % (ref 39.0–52.0)
Hemoglobin: 13.2 g/dL (ref 13.0–17.0)
MCH: 29.9 pg (ref 26.0–34.0)
MCHC: 33.5 g/dL (ref 30.0–36.0)
MCV: 89.1 fL (ref 80.0–100.0)
Platelets: 193 K/uL (ref 150–400)
RBC: 4.42 MIL/uL (ref 4.22–5.81)
RDW: 13.3 % (ref 11.5–15.5)
WBC: 13.7 K/uL — ABNORMAL HIGH (ref 4.0–10.5)
nRBC: 0 % (ref 0.0–0.2)

## 2024-04-29 LAB — COMPREHENSIVE METABOLIC PANEL WITH GFR
ALT: 10 U/L (ref 0–44)
AST: 16 U/L (ref 15–41)
Albumin: 3.5 g/dL (ref 3.5–5.0)
Alkaline Phosphatase: 46 U/L (ref 38–126)
Anion gap: 10 (ref 5–15)
BUN: 14 mg/dL (ref 8–23)
CO2: 25 mmol/L (ref 22–32)
Calcium: 8.9 mg/dL (ref 8.9–10.3)
Chloride: 103 mmol/L (ref 98–111)
Creatinine, Ser: 1.04 mg/dL (ref 0.61–1.24)
GFR, Estimated: >60 mL/min (ref >60)
Glucose, Bld: 129 mg/dL — ABNORMAL HIGH (ref 70–99)
Potassium: 4.1 mmol/L (ref 3.5–5.1)
Sodium: 137 mmol/L (ref 135–145)
Total Bilirubin: 2.7 mg/dL — ABNORMAL HIGH (ref 0.0–1.2)
Total Protein: 6.6 g/dL (ref 6.5–8.1)

## 2024-04-29 LAB — GLUCOSE, CAPILLARY
Glucose-Capillary: 141 mg/dL — ABNORMAL HIGH (ref 70–99)
Glucose-Capillary: 138 mg/dL — ABNORMAL HIGH (ref 70–99)

## 2024-04-29 MED ORDER — IOHEXOL 300 MG/ML  SOLN
50.0000 mL | Freq: Once | INTRAMUSCULAR | Status: AC | PRN
  Administered 2024-04-29: 10 mL

## 2024-04-29 MED ORDER — LIDOCAINE HCL 1 % IJ SOLN
20.0000 mL | Freq: Once | INTRAMUSCULAR | Status: AC
  Administered 2024-04-29: 20 mL

## 2024-04-29 MED ORDER — ASPIRIN 81 MG PO CHEW
81.0000 mg | CHEWABLE_TABLET | Freq: Every day | ORAL | Status: DC
  Administered 2024-04-30 – 2024-05-03 (×4): 81 mg via ORAL
  Filled 2024-04-29 (×4): qty 1

## 2024-04-29 MED ORDER — HYDRALAZINE HCL 20 MG/ML IJ SOLN: 5.0000 mg | Freq: Four times a day (QID) | INTRAMUSCULAR | Status: DC | PRN

## 2024-04-29 MED ORDER — FENTANYL CITRATE (PF) 100 MCG/2ML IJ SOLN
INTRAMUSCULAR | Status: DC | PRN
  Administered 2024-04-29 (×2): 50 ug via INTRAVENOUS

## 2024-04-29 MED ORDER — SODIUM CHLORIDE 0.9 % IV SOLN: INTRAVENOUS | Status: AC

## 2024-04-29 MED ORDER — MEPERIDINE HCL 25 MG/ML IJ SOLN
INTRAMUSCULAR | Status: AC
  Filled 2024-04-29: qty 1

## 2024-04-29 MED ORDER — ONDANSETRON HCL 4 MG/2ML IJ SOLN
INTRAMUSCULAR | Status: DC | PRN
  Administered 2024-04-29: 14:00:00 4 mg via INTRAVENOUS

## 2024-04-29 MED ORDER — ONDANSETRON HCL 4 MG/2ML IJ SOLN
INTRAMUSCULAR | Status: AC
  Filled 2024-04-29: qty 2

## 2024-04-29 MED ORDER — CLOPIDOGREL BISULFATE 75 MG PO TABS
75.0000 mg | ORAL_TABLET | Freq: Every evening | ORAL | Status: DC
  Administered 2024-04-30 – 2024-05-02 (×3): 75 mg via ORAL
  Filled 2024-04-29 (×3): qty 1

## 2024-04-29 MED ORDER — INSULIN ASPART 100 UNIT/ML IJ SOLN
0.0000 [IU] | Freq: Three times a day (TID) | INTRAMUSCULAR | Status: DC
  Administered 2024-04-29 – 2024-05-01 (×3): 2 [IU] via SUBCUTANEOUS
  Administered 2024-05-01: 3 [IU] via SUBCUTANEOUS
  Administered 2024-05-02 – 2024-05-03 (×2): 2 [IU] via SUBCUTANEOUS
  Filled 2024-04-29: qty 3
  Filled 2024-04-29 (×5): qty 2

## 2024-04-29 MED ORDER — FENTANYL CITRATE (PF) 100 MCG/2ML IJ SOLN
INTRAMUSCULAR | Status: AC
  Filled 2024-04-29: qty 2

## 2024-04-29 MED ORDER — HYDROMORPHONE HCL 1 MG/ML IJ SOLN
INTRAMUSCULAR | Status: DC | PRN
  Administered 2024-04-29: 14:00:00 0.5 mg via INTRAVENOUS
  Administered 2024-04-29: 14:00:00 .5 mg via INTRAVENOUS

## 2024-04-29 MED ORDER — MIDAZOLAM HCL (PF) 2 MG/2ML IJ SOLN
INTRAMUSCULAR | Status: DC | PRN
  Administered 2024-04-29 (×2): 1 mg via INTRAVENOUS

## 2024-04-29 MED ORDER — HYDROMORPHONE HCL 1 MG/ML IJ SOLN: 1.0000 mg | Freq: Once | INTRAMUSCULAR | Status: DC

## 2024-04-29 MED ORDER — HYDROMORPHONE HCL 1 MG/ML IJ SOLN
INTRAMUSCULAR | Status: AC
  Filled 2024-04-29: qty 0.5

## 2024-04-29 MED ORDER — MORPHINE SULFATE (PF) 2 MG/ML IV SOLN
INTRAVENOUS | Status: AC
  Filled 2024-04-29: qty 1

## 2024-04-29 MED ORDER — MEPERIDINE HCL 25 MG/ML IJ SOLN
25.0000 mg | Freq: Once | INTRAMUSCULAR | Status: AC
  Administered 2024-04-29: 25 mg via INTRAVENOUS

## 2024-04-29 MED ORDER — INSULIN ASPART 100 UNIT/ML IJ SOLN: 0.0000 [IU] | Freq: Every day | INTRAMUSCULAR | Status: DC

## 2024-04-29 MED ORDER — SODIUM CHLORIDE 0.9% FLUSH
5.0000 mL | Freq: Three times a day (TID) | INTRAVENOUS | Status: DC
  Administered 2024-04-29 – 2024-05-03 (×11): 5 mL

## 2024-04-29 MED ORDER — HYDROMORPHONE HCL 1 MG/ML IJ SOLN
INTRAMUSCULAR | Status: AC
  Filled 2024-04-29: qty 1

## 2024-04-29 MED ORDER — MIDAZOLAM HCL 2 MG/2ML IJ SOLN
INTRAMUSCULAR | Status: AC
  Filled 2024-04-29: qty 2

## 2024-04-29 MED ORDER — MORPHINE SULFATE (PF) 4 MG/ML IV SOLN
4.0000 mg | INTRAVENOUS | Status: DC | PRN
  Administered 2024-04-29 – 2024-05-01 (×4): 4 mg via INTRAVENOUS
  Filled 2024-04-29 (×4): qty 1

## 2024-04-29 MED ORDER — SODIUM CHLORIDE FLUSH 0.9 % IV SOLN: 5.0000 mL | Freq: Every day | INTRAVENOUS | 1 refills | Status: DC

## 2024-04-29 MED ORDER — MORPHINE SULFATE (PF) 2 MG/ML IV SOLN: 2.0000 mg | INTRAVENOUS | Status: DC | PRN

## 2024-04-29 MED FILL — Lidocaine HCl Local Inj 1%: INTRAMUSCULAR | Qty: 20 | Status: AC

## 2024-04-29 NOTE — Progress Notes (Signed)
 PT Cancellation Note  Patient Details Name: Jeffery Sweeney MRN: [id number] DOB: Aug 21, 1960   Cancelled Treatment:    Reason Eval/Treat Not Completed: Fatigue/lethargy limiting ability to participate Patient had just worked with OT, declined PT at this time. Will re-attempt at later time/date.  Jeffery Sweeney 04/29/2024, 9:25 AM

## 2024-04-29 NOTE — Progress Notes (Signed)
 MD aware  MEWS Progress Note  Patient Details Name: Jeffery Sweeney MRN: [id number] DOB: 09-22-1960 Today's Date: 04/29/2024   MEWS Flowsheet Documentation:  Assess: MEWS Score Temp: 98.6 F (37 C) BP: 130/86 MAP (mmHg): 101 Pulse Rate: (!) 129 ECG Heart Rate: (!) 130 Resp: 16 Level of Consciousness: Alert SpO2: 94 % O2 Device: Nasal Cannula Patient Activity (if Appropriate): In chair O2 Flow Rate (L/min): 6 L/min Assess: MEWS Score MEWS Temp: 0 MEWS Systolic: 0 MEWS Pulse: 2 MEWS RR: 0 MEWS LOC: 0 MEWS Score: 2 MEWS Score Color: Yellow Assess: SIRS CRITERIA SIRS Temperature : 0 SIRS Respirations : 0 SIRS Pulse: 1 SIRS WBC: 1 SIRS Score Sum : 2          [name] [name] 04/29/2024, 7:36 PM

## 2024-04-29 NOTE — OR Nursing (Addendum)
 Pt arrived from post chole drain placement coalling out in unconsolable pain. Thrashing. Diaphoretic, heart rate 140's bp 212/176 repeated in two minutes 172/122. Morphine  2 mg iv given. Dr Hughes notified, rigors noted

## 2024-04-29 NOTE — Plan of Care (Signed)

## 2024-04-29 NOTE — Evaluation (Signed)
 Occupational Therapy Evaluation Patient Details Name: Jeffery Sweeney MRN: [id number] DOB: 02-07-1961 Today's Date: 04/29/2024   History of Present Illness   63 year old man with HTN, CAD, CVA s/p PCI, ongoing tobacco use presented with acute onset of epigastric and RUQ abdominal pain without associated nausea vomiting or diarrhea.  RUQ US  revealed Cholelithiasis without sonographic evidence of acute cholecystitis and borderline dilated CBD at 8 mm unchanged from prior CT.     Clinical Impressions Patient presenting with decreased Ind in self care,balance,functional mobility, transfers, endurance, and safety awareness. Patient reports being Ind at baseline at lives in a boarding house. 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 Pt managing LB clothing and urinal without LOB. Pt fatigues quickly and having increased pain in abdomen. Plan for drain to be placed today. Pt requesting to remain seated on EOB today.  Patient will benefit from acute OT to increase overall independence in the areas of ADLs, functional mobility, and safety awareness in order to safely discharge.      If plan is discharge home, recommend the following:   A little help with walking and/or transfers;A little help with bathing/dressing/bathroom     Functional Status Assessment   Patient has had a recent decline in their functional status and demonstrates the ability to make significant improvements in function in a reasonable and predictable amount of time.     Equipment Recommendations   None recommended by OT      Precautions/Restrictions   Precautions Precautions: Fall Precaution/Restrictions Comments: low fall risk     Mobility Bed Mobility Overal bed mobility: Needs Assistance Bed Mobility: Supine to Sit, Rolling Rolling: Contact guard assist   Supine to sit: Contact guard           Transfers Overall transfer level: Needs assistance Equipment used: None, 1 person hand held assist Transfers: Sit to/from Stand, Bed to chair/wheelchair/BSC Sit to Stand: Contact guard assist Stand pivot transfers: Contact guard assist                Balance Overall balance assessment: Needs assistance Sitting-balance support: Feet supported Sitting balance-Leahy Scale: Good     Standing balance support: Reliant on assistive device for balance, Bilateral upper extremity supported Standing balance-Leahy Scale: Fair                             ADL either performed or assessed with clinical judgement   ADL Overall ADL's : Needs assistance/impaired                         Toilet Transfer: Contact guard Marine Scientist Details (indicate cue type and reason): simulation                 Vision Patient Visual Report: No change from baseline              Pertinent Vitals/Pain Pain Assessment Pain Assessment: 0-10 Pain Score: 6  Pain Location: abdomen Pain Descriptors / Indicators: Aching, Discomfort, Grimacing, Guarding, Shooting, Spasm Pain Intervention(s): Limited activity within patient's tolerance, Premedicated before session, Repositioned     Extremity/Trunk Assessment Upper Extremity Assessment Upper Extremity Assessment: Generalized weakness   Lower Extremity Assessment Lower Extremity Assessment: Generalized weakness       Communication Communication Communication: No apparent difficulties   Cognition Arousal: Alert Behavior During Therapy:  WFL for tasks assessed/performed Cognition: No apparent impairments                               Following commands: Intact       Cueing  General Comments   Cueing Techniques: Verbal cues              Home Living Family/patient expects to be discharged to:: Other (Comment) (boarding house)                                  Additional Comments: Pt reports having no equipment at home.      Prior Functioning/Environment Prior Level of Function : Independent/Modified Independent;Working/employed;Driving             Mobility Comments: Ind at home and in community, he drives ADLs Comments: Pt reports being Ind with self care and IADLs. 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    OT Problem List: Decreased strength;Decreased safety awareness;Decreased activity tolerance;Impaired balance (sitting and/or standing)   OT Treatment/Interventions: Self-care/ADL training;Therapeutic activities;Energy conservation;Patient/family education;Balance training      OT Goals(Current goals can be found in the care plan section)   Acute Rehab OT Goals Patient Stated Goal: to decrease pain OT Goal Formulation: With patient Time For Goal Achievement: 05/13/24 Potential to Achieve Goals: Fair ADL Goals Pt Will Perform Grooming: with modified independence;standing Pt Will Perform Lower Body Dressing: with modified independence;sit to/from stand Pt Will Transfer to Toilet: with modified independence;ambulating Pt Will Perform Toileting - Clothing Manipulation and hygiene: with modified independence;sit to/from stand   OT Frequency:  Min 2X/week       AM-PAC OT 6 Clicks Daily Activity     Outcome Measure Help from another person eating meals?: None Help from another person taking care of personal grooming?: A Little Help from another person toileting, which includes using toliet, bedpan, or urinal?: A Little Help from another person bathing (including washing, rinsing, drying)?: A Little Help from another person to put on and taking off regular upper body clothing?: None Help from another person to put on and taking off regular lower body clothing?: A Little 6 Click Score: 20   End of Session Nurse Communication: Mobility status  Activity Tolerance: Patient limited by fatigue;Patient limited by pain Patient  left: in bed;with call bell/phone within reach;with bed alarm set                   Time: 9097-9082 OT Time Calculation (min): 15 min Charges:  OT General Charges $OT Visit: 1 Visit OT Evaluation $OT Eval Moderate Complexity: 1 [address] [place], OTR/L , CBIS ascom [phone]  04/29/2024, 11:20 AM

## 2024-04-29 NOTE — Progress Notes (Signed)
 [facility] SURGICAL PROGRESS NOTE (cpt [id number])  Hospital Day(s): 0.   Interval History: Patient seen and examined, no acute events or new complaints overnight. Patient reports he continues to have RUQ abdominal pain; unchanged. No fever, chills, nausea, emesis. Leukocytosis with slight improvement; WBC 13.7K. Hgb to 13.2; stable. Renal function normal; sCr - 1.04; UO - 400 ccs + unmeasured. No electrolyte derangements. HIDA was positive. Plan for percutaneous cholecystostomy tube today. He is on Zosyn.   Review of Systems:  Constitutional: denies fever, chills  HEENT: denies cough or congestion  Respiratory: denies any shortness of breath  Cardiovascular: denies chest pain or palpitations  Gastrointestinal: + abdominal pain; denied N/V Genitourinary: denies burning with urination or urinary frequency Musculoskeletal: denies pain, decreased motor or sensation  Vital signs in last 24 hours: [min-max] current  Temp:  [98.5 F (36.9 C)-100.8 F (38.2 C)] 99.3 F (37.4 C) (12/12 0500) Pulse Rate:  [91-134] 96 (12/12 0500) Resp:  [17-20] 18 (12/12 0500) BP: (109-159)/(65-92) 109/65 (12/12 0500) SpO2:  [89 %-92 %] 92 % (12/12 0500)       Weight: 91 kg     Intake/Output last 2 shifts:  12/11 0701 - 12/12 0700 In: 1726.4 [I.V.:1626.4; IV Piggyback:100] Out: 400 [Urine:400]   Physical Exam:  Constitutional: alert, cooperative and no distress  HENT: normocephalic without obvious abnormality  Eyes: PERRL, EOM's grossly intact and symmetric  Respiratory: breathing non-labored at rest  Cardiovascular: regular rate and sinus rhythm  Gastrointestinal: soft, he continues to be markedly tender in RUQ, and non-distended   Labs:     Latest Ref Rng & Units 04/29/2024    5:22 AM 04/28/2024    6:09 AM 04/27/2024    8:52 PM  CBC  WBC 4.0 - 10.5 K/uL 13.7  14.1  9.1   Hemoglobin 13.0 - 17.0 g/dL 86.7  85.3  84.5   Hematocrit 39.0 - 52.0 % 39.4  43.1  44.6   Platelets 150 -  400 K/uL 193  224  278       Latest Ref Rng & Units 04/29/2024    5:22 AM 04/28/2024    6:09 AM 04/27/2024    8:52 PM  CMP  Glucose 70 - 99 mg/dL 870  843  854   BUN 8 - 23 mg/dL 14  12  10    Creatinine 0.61 - 1.24 mg/dL 8.95  9.03  8.91   Sodium 135 - 145 mmol/L 137  138  139   Potassium 3.5 - 5.1 mmol/L 4.1  4.4  4.1   Chloride 98 - 111 mmol/L 103  102  101   CO2 22 - 32 mmol/L 25  24  25    Calcium  8.9 - 10.3 mg/dL 8.9  9.2  9.3   Total Protein 6.5 - 8.1 g/dL 6.6  7.0  7.6   Total Bilirubin 0.0 - 1.2 mg/dL 2.7  1.1  1.0   Alkaline Phos 38 - 126 U/L 46  55  61   AST 15 - 41 U/L 16  23  23    ALT 0 - 44 U/L 10  18  20       Imaging studies: No new pertinent imaging studies   Assessment/Plan: (ICD-10's: K81.0) 63 y.o. male with acute cholecystitis, complicated by significant cardiac history, need for Plavix                 - Work up concerning for acute cholecystitis with continued abdominal pain and leukocytosis. Given his cardiac history, and need  for Plavix , he is a sub-optimal surgical candidate in this setting. Case has been reviewed with IR who is agreeable percutaneous cholecystostomy tube placement. Appreciate their assistance. He understands he will need this for 6-8 weeks prior to considering interval cholecystectomy              - Patient will need to continue to be NPO; IVF support              - Continue IV Abx (Zosyn)             - Monitor abdominal examination             - Pain control prn; antiemetics prn             - Monitor leukocytosis; stable  - Further management per primary service; we will follow    All of the above findings and recommendations were discussed with the patient, and the medical team, and all of patient's questions were answered to his expressed satisfaction.  -- Arthea Platt, PA-C [facility] 04/29/2024, 7:29 AM M-F: 7am - 4pm

## 2024-04-29 NOTE — Progress Notes (Signed)
 Patient clinically stable with placement of IR Chole tube placement, however as soon as drain placed, began thrashing in grave pain, yelling out, becoming diaphoretic as coming back to specials recovery [facility], given zofran  as charted for nausea, consulted with PA/IR along with Dr Hughes, meds given iv per Alsyon Patterson RN as charted., patient has calmed down, dozing at times, will arouse to name. Jeffery Sweeney/PA present at bedside at  this time. Bp stabilizing post ordered interventions. Vitals as charted. Report given to Alyson Patterson RN at bedside upon arrival of patient for recovery.

## 2024-04-29 NOTE — Discharge Instructions (Signed)
 Drain instructions: Please flush drain with 5 ml saline syringe once daily. Please empty drain and record output once or twice daily. Change dressing with 4x4 gauze and tape daily or as needed to keep the insertion site clean/dry.     Interventional Radiology Percutaneous Abscess Drain Placement After Care  This sheet gives you information about how to care for yourself after your procedure. Your health care provider may also give you more specific instructions. Your drain was placed by an interventional radiologist with [facility]. If you have questions or concerns, contact [facility] at [phone]. What is a percutaneous drain? A drain is a small plastic tube (catheter) that goes into the fluid collection in your body through your skin. How long will I need the drain? How long the drain needs to stay in is determined by where the drain is, how much comes out of the drain each day and if you are having any other surgical procedures. Interventional radiology will determine when it is time to remove the drain. It is important to follow up as directed so that the drain can be removed as soon as it is safe to do so. What can I expect after the procedure? After the procedure, it is common to have: A small amount of bruising and discomfort in the area where the drainage tube (catheter) was placed. Sleepiness and fatigue. This should go away after the medicines you were given have worn off. Follow these instructions at home: Insertion site care Check your insertion site when you change the bandage. Check for: More redness, swelling, or pain. More fluid or blood. Warmth. Pus or a bad smell. When caring for your insertion site: Wash your hands with soap and water for at least 20 seconds before and after you change your bandage (dressing). If soap and water are not available, use hand sanitizer. You do not need to change your dressing everyday if it is clean and dry.  Change your dressing every 3 days or as needed when it is soiled, wet or becoming dislodged. You will need to change your dressing each time you shower. Leave stitches (sutures), skin glue, or adhesive strips in place. These skin closures may need to stay in place for 2 weeks or longer. If adhesive strip edges start to loosen and curl up, you may trim the loose edges. Do not remove adhesive strips completely unless your health care provider tells you to do so.  Catheter care Flush the catheter once per day with 5 mL of 0.9% normal saline unless you are told otherwise by your healthcare provider. This helps to prevent clogs in the catheter. To disconnect the drain, turn the clear plastic tube to the left. Attach the saline syringe by placing it on the white end of the drain and turning gently to the right. Once attached gently push the plunger to the 5 mL mark. After you are done flushing, disconnect the syringe by turning to the left and reattach your drainage container    If you have a bulb please be sure the bulb is charged after reconnecting it - to do this pinch the bulb between your thumb and first finger and close the stopper located on the top of the bulb.   Check for fluid leaking from around your catheter (instead of fluid draining through your catheter). This may be a sign that the drain is no longer working correctly. Write down the following information every time you empty your bag: The date and time.  The amount of drainage. Activity Rest at home for 1-2 days after your procedure. For the first 48 hours do not lift anything more than 10 lbs (about a gallon of milk). You may perform moderate activities/exercise. Please avoid strenuous activities during this time. Avoid any activities which may pull on your drain as this can cause your drain to become dislodged. If you were given a sedative during the procedure, it can affect you for several hours. Do not drive or operate machinery  until your health care provider says that it is safe. General instructions For mild pain take over-the-counter medications as needed for pain such as Tylenol  or Advil. If you are experiencing severe pain please call our office as this may indicate an issue with your drain.  If you were prescribed an antibiotic medicine, take it as told by your health care provider. Do not stop using the antibiotic even if you start to feel better. You may shower 24 hours after the drain is placed. To do this cover the insertion site with a water tight material such as saran wrap and seal the edges with tape, you may also purchase waterproof dressings at your local drug store. Shower as usual and then remove the water tight dressing and any gauze/tape underneath it once you have exited the shower and dried off. Allow the area to air dry or pat dry with a clean towel. Once the skin is completely dry place a new gauze dressing. It is important to keep the site dry at all times to prevent infection. Do not submerge the drain - this means you cannot take baths, swim, use a hot tub, etc. until the drain is removed.  Do not use any products that contain nicotine  or tobacco, such as cigarettes, e-cigarettes, and chewing tobacco. If you need help quitting, ask your health care provider. Keep all follow-up visits as told by your health care provider. This is important. Contact a health care provider if: You have less than 10 mL of drainage a day for 2-3 days in a row, or as directed by your health care provider. You have any of these signs of infection: More redness, swelling, or pain around your incision area. More fluid or blood coming from your incision area. Warmth coming from your incision area. Pus or a bad smell coming from your incision area. You have fluid leaking from around your catheter (instead of through your catheter). You are unable to flush the drain. You have a fever or chills. You have pain that does not  get better with medicine. You have not been contacted to schedule a drain follow up appointment within 10 days of discharge from the hospital. Please call [facility] at [phone] with any questions or concerns. Get help right away if: Your catheter comes out. You suddenly stop having drainage from your catheter. You suddenly have blood in the fluid that is draining from your catheter. You become dizzy or you faint. You develop a rash. You have nausea or vomiting. You have difficulty breathing or you feel short of breath. You develop chest pain. You have problems with your speech or vision. You have trouble balancing or moving your arms or legs. Summary It is common to have a small amount of bruising and discomfort in the area where the drainage tube (catheter) was placed. You may also have minor discomfort with movement while the drain is in place. Flush the drain once per day with 5 mL of 0.9% normal saline (unless  you were told otherwise by your healthcare provider).  Record the amount of drainage from the bag every time you empty it. Change the dressing every 3 days or earlier if soiled/wet. Keep the skin dry under the dressing. You may shower with the drain in place. Do not submerge the drain (no baths, swimming, hot tubs, etc.). Contact [place] at [phone] if you have more redness, swelling, or pain around your incision area or if you have pain that does not get better with medicine. This information is not intended to replace advice given to you by your health care provider. Make sure you discuss any questions you have with your health care provider. Document Revised: 08/08/2021 Document Reviewed: 04/30/2019 [employer] Patient Education  2023 [employer]    Interventional Radiology Drain Record Empty your drain at least once per day. You may empty it as often as needed. Use this form to write down the amount of fluid that has collected in the  drainage container. Bring this form with you to your follow-up visits. Please call [facility] at [phone] with any questions or concerns prior to your appointment.  Drain #1 location: ___________________ Date __________ Time __________ Amount __________ Date __________ Time __________ Amount __________ Date __________ Time __________ Amount __________ Date __________ Time __________ Amount __________ Date __________ Time __________ Amount __________ Date __________ Time __________ Amount __________ Date __________ Time __________ Amount __________ Date __________ Time __________ Amount __________ Date __________ Time __________ Amount __________ Date __________ Time __________ Amount __________ Date __________ Time __________ Amount __________ Date __________ Time __________ Amount __________ Date __________ Time __________ Amount __________ Date __________ Time __________ Amount __________     Robie #2 location: ___________________ Date __________ Time __________ Amount __________ Date __________ Time __________ Amount __________ Date __________ Time __________ Amount __________ Date __________ Time __________ Amount __________ Date __________ Time __________ Amount __________ Date __________ Time __________ Amount __________ Date __________ Time __________ Amount __________ Date __________ Time __________ Amount __________ Date __________ Time __________ Amount __________ Date __________ Time __________ Amount __________ Date __________ Time __________ Amount __________ Date __________ Time __________ Amount __________ Date __________ Time __________ Amount __________ Date __________ Time __________ Amount __________

## 2024-04-29 NOTE — OR Nursing (Signed)
 Remains tachy and hypertensive. Rigors but able to respond to voice by opening eyes. Readily falls asleep Dr Hughes at bedside. Demoral 25 mg given per order

## 2024-04-29 NOTE — Progress Notes (Addendum)
 PROGRESS NOTE  Jeffery Sweeney  FMW:[id number] DOB: 1960/11/23 DOA: 04/27/2024 PCP: Towana Small, FNP   Jeffery Sweeney is a 63 year old male with history of hypertension, CAD, CVA status post PCI, ongoing tobacco use  04/27/2024: Presents emergency department for chief concerns of abdominal pain.  12/10: Patient admitted to [facility] for chief concerns of abdominal pain suspect biliary colic.  General surgery was consulted.  12/11: General surgery was consulted.  HIDA scan was consistent with nonfilling of the gallbladder consistent with cystic duct obstruction and acute cholecystitis.  General surgery noted that given aspirin  and Plavix , history of CAD, high risk surgery.  IR consulted for percutaneous placement.  12/12: I assumed care of the patient.  IR procedure with drain placement today.  He received a lot of pain medication due to screaming and excruciating pain.  Patient received fentanyl  50 mcg x 2 doses since 1331, Dilaudid  0.5 mg x 2, Versed  1 mg x 2 doses since 1331.  He also received morphine  2 mg IV at 1412.  I discontinued postop fentanyl  and Dilaudid .  Patient now only has morphine  2 mg IV every 4 hours as needed for moderate pain, morphine  4 mg IV every 4 hours as needed for severe pain, 2 days ordered.  Assessment & Plan:   Principal Problem:   Biliary colic Active Problems:   Dyslipidemia   Coronary artery disease   Controlled type 2 diabetes mellitus without complication, without long-term current use of insulin  (HCC)   Essential hypertension   Assessment and Plan:  * Biliary colic - The patient will be admitted to a medical telemetry observation bed. - He will be kept NPO. - Will continue PPI therapy. - General Surgery consult will be obtained. - I notified Dr. Desiderio about the patient.  04/29/2024: Patient got IR drain today.  Per midlevel, patient did not responded well to the IR drain and required pain medication.  Patient  then developed rigors and required Demerol and this was given by interventional radiologist.  At bedside there is a drain placed and dressing appears to be clean with no active signs of bleeding at the dressing.  There is about 100 mL of bile dark fluid that has been drained. Continue Zosyn per pharmacy at this time.  Patient will need to be taught how to flush the drain once he goes home.  Nursing is aware that they need to help patient/family with this education.  Per general surgery.  Essential hypertension Home metoprolol  50 mg p.o. twice daily, Imdur  60 mg p.o. twice daily  12/12: Hydralazine  5 mg IV every 6 hours as needed for SBP greater 170, 5 days ordered  Controlled type 2 diabetes mellitus without complication, without long-term current use of insulin  (HCC) - The patient will be placed on supplemental coverage with NovoLog . - Will hold off metformin .  12/12: Insulin  SSI with at bedtime coverage ordered  Coronary artery disease - Serial troponins will be followed. - Will continue Imdur  and beta-blocker therapy as well as statin therapy. - We will hold off aspirin  and Plavix  for now.  12/12: Aspirin  and Plavix  resumed for 12/13.  Dyslipidemia Rosuvastatin  40 mg daily  DVT prophylaxis: Enoxaparin  Code Status: Full code Family Communication: No Disposition Plan: Pending clinical course Level of care: Telemetry  Consultants:  IR, general surgery  Procedures:  Percutaneous drain placement  Antimicrobials: Zosyn  Subjective:  At bedside, patient is drowsy after getting multiple plain medication including Demerol for rigors.  Patient is sleeping at this  time.  He does open his eyes minimally with sternal rub.  Per nursing in special recovery, patient was following commands despite falling back to sleep.  Objective: Vitals:   04/29/24 1454 04/29/24 1519 04/29/24 1600 04/29/24 1615  BP:  114/81 (!) 147/96 (!) 148/93  Pulse:   (!) 130 (!) 129  Resp:   18 14   Temp: 98.9 F (37.2 C)     TempSrc: Axillary     SpO2:   91% 97%  Weight:        Intake/Output Summary (Last 24 hours) at 04/29/2024 1629 Last data filed at 04/29/2024 1600 Gross per 24 hour  Intake 1726.42 ml  Output 500 ml  Net 1226.42 ml   Filed Weights   04/27/24 2316  Weight: 91 kg   Examination:  General exam: Appears calm and comfortable  Respiratory system: Clear to auscultation. Respiratory effort normal. Cardiovascular system: S1 & S2 heard, RRR. No JVD, murmurs, rubs, gallops or clicks. No pedal edema. Gastrointestinal system: Abdomen is nondistended, soft and nontender. No organomegaly or masses felt. Normal bowel sounds heard. Central nervous system: Alert and oriented. No focal neurological deficits. Extremities: Symmetric 5 x 5 power. Skin: No rashes, lesions or ulcers Psychiatry: Judgement and insight appear normal. Mood & affect appropriate.   Data Reviewed: I have personally reviewed following labs and imaging studies  CBC: Recent Labs  Lab 04/27/24 2052 04/28/24 0609 04/29/24 0522  WBC 9.1 14.1* 13.7*  HGB 15.4 14.6 13.2  HCT 44.6 43.1 39.4  MCV 87.1 88.9 89.1  PLT 278 224 193   Basic Metabolic Panel: Recent Labs  Lab 04/27/24 2052 04/28/24 0609 04/29/24 0522  NA 139 138 137  K 4.1 4.4 4.1  CL 101 102 103  CO2 25 24 25   GLUCOSE 145* 156* 129*  BUN 10 12 14   CREATININE 1.08 0.96 1.04  CALCIUM  9.3 9.2 8.9   GFR: Estimated Creatinine Clearance: 71.6 mL/min (by C-G formula based on SCr of 1.04 mg/dL).  Liver Function Tests: Recent Labs  Lab 04/27/24 2052 04/28/24 0609 04/29/24 0522  AST 23 23 16   ALT 20 18 10   ALKPHOS 61 55 46  BILITOT 1.0 1.1 2.7*  PROT 7.6 7.0 6.6  ALBUMIN  4.3 4.0 3.5   Recent Labs  Lab 04/27/24 2052  LIPASE 24   Coagulation Profile: Recent Labs  Lab 04/28/24 1656  INR 1.1   Radiology Studies: IR Perc Cholecystostomy Result Date: 04/29/2024 INDICATION: Acute cholecystitis. EXAM: ULTRASOUND AND  FLUOROSCOPIC-GUIDED CHOLECYSTOSTOMY TUBE PLACEMENT COMPARISON:  NM HIDA, 04/28/2024.  US  Abdomen, 04/27/2024. MEDICATIONS: The patient is currently admitted to the hospital and on intravenous antibiotics. Antibiotics were administered within an appropriate time frame prior to skin puncture. 0.5 mg Dilaudid  IV and 25 mg Demerol IV ANESTHESIA/SEDATION: Moderate (conscious) sedation was employed during this procedure. A total of Versed  1 mg and Fentanyl  100 mcg was administered intravenously. Moderate Sedation Time: 21 minutes. The patient's level of consciousness and vital signs were monitored continuously by radiology nursing throughout the procedure under my direct supervision. CONTRAST:  10mL OMNIPAQUE  IOHEXOL  300 MG/ML SOLN - administered into the gallbladder fossa. FLUOROSCOPY TIME:  Fluoroscopic dose; 12.6 mGy COMPLICATIONS: None immediate. PROCEDURE: Informed written consent was obtained from the patient after a discussion of the risks, benefits and alternatives to treatment. Questions regarding the procedure were encouraged and answered. A timeout was performed prior to the initiation of the procedure. The right upper abdominal quadrant was prepped and draped in the usual sterile fashion, and a  sterile drape was applied covering the operative field. Maximum barrier sterile technique with sterile gowns and gloves were used for the procedure. A timeout was performed prior to the initiation of the procedure. Local anesthesia was provided with 1% lidocaine  with epinephrine . Ultrasound scanning of the RIGHT upper quadrant demonstrates a distended gallbladder. Of note, the patient reported pain with ultrasound imaging over the gallbladder. Utilizing a transhepatic approach, a 22 gauge needle was advanced into the gallbladder under direct ultrasound guidance. An ultrasound image was saved for documentation purposes. Appropriate intraluminal puncture was confirmed with the efflux of bile and advancement of an 0.018  wire into the gallbladder lumen. The needle was exchanged for an Accustick set. A small amount of contrast was injected to confirm appropriate intraluminal positioning. Over a Benson wire, a 10 Fr cholecystomy tube was advanced into the gallbladder fossa, coiled and locked. Bile was aspirated and a small amount of contrast was injected as several post procedural spot radiographic images were obtained in various obliquities. The catheter was secured to the skin with suture, connected to a drainage bag and a dressing was placed. The patient tolerated the procedure well without immediate post procedural complication. IMPRESSION: Successful ultrasound and fluoroscopic guided placement of a 10 Fr cholecystostomy tube. The patient developed rigors acutely s/p drain placement. This was managed with medications, and his primary team was notified. RECOMMENDATIONS: The patient will return to Vascular Interventional Radiology (VIR) for routine drainage catheter evaluation and exchange in 6-8 weeks. Thom Hall, MD [facility] Electronically Signed   By: Thom Hall M.D.   On: 04/29/2024 14:56   NM Hepatobiliary Liver Func Result Date: 04/28/2024 EXAM: NM HEPATOBILLARY SCAN 04/28/2024 03:49:19 PM TECHNIQUE: RADIOPHARMACEUTICAL: 5.38 mCi Tc-9m mebrofenin  Dynamic images of the abdomen and pelvis were obtained in the anterior projection for 1 hour after intravenous administration of radiopharmaceutical. 3 mg IV morphine  was administered and imaging continued for at least 30 minutes. COMPARISON: Ultrasound. CLINICAL HISTORY: RUQ abdominal pain, biliary disease suspected, US  nondiagnostic. FINDINGS: Homogenous uptake within the liver. Normal clearance of the blood pool. Appropriate excretion into the biliary system. Non-filling of the gallbladder at 60 minutes. The gallbladder failed to fill after morphine  augmentation. Activity is visualized in the small bowel.  IMPRESSION: 1. Non-filling of the gallbladder after morphine  augmentation concerning for cystic duct obstruction / acute cholecystitis. 2. Patent common bile duct . 3. these results will be called to the ordering clinician or representative by the radiologist assistant, and communication documented in the pacs or clario dashboard Electronically signed by: Norleen Boxer MD 04/28/2024 03:57 PM EST RP Workstation: [id number]   US  Abdomen Limited RUQ (LIVER/GB) Result Date: 04/27/2024 EXAM: Right Upper Quadrant Abdominal Ultrasound 04/27/2024 10:10:00 PM TECHNIQUE: Real-time ultrasonography of the right upper quadrant of the abdomen was performed. COMPARISON: 12/27/2021 and CT 06/09/2023. CLINICAL HISTORY: RUQ abdominal pain. FINDINGS: LIVER: Diffuse fatty infiltration of the liver. No intrahepatic biliary ductal dilatation. No evidence of mass. BILIARY SYSTEM: Several small layering gallstones measuring up to 4 mm. Sludge noted within the gallbladder. No wall thickening or sonographic cholelithiasis. Common bile duct borderline dilated at 8 mm. The distal bile duct is not visualized due to overlying bowel gas. Common bile duct size is stable since prior CT. No pericholecystic fluid. OTHER: No right upper quadrant ascites. IMPRESSION: 1. Cholelithiasis without sonographic evidence of acute cholecystitis. 2. Borderline dilated common bile duct measuring 8 mm, unchanged from prior CT. Distal duct not visualized due to overlying bowel gas. 3.  Diffuse hepatic steatosis. Electronically signed by: Franky Crease MD 04/27/2024 10:32 PM EST RP Workstation: [id number]   Scheduled Meds:  [START ON 04/30/2024] aspirin   81 mg Oral Daily   [START ON 04/30/2024] clopidogrel   75 mg Oral QPM   enoxaparin  (LOVENOX ) injection  40 mg Subcutaneous Q24H   HYDROmorphone        insulin  aspart  0-15 Units Subcutaneous TID WC   insulin  aspart  0-5 Units Subcutaneous QHS   isosorbide  mononitrate  60 mg Oral BID   metoprolol  tartrate  50  mg Oral BID   morphine  (PF)       pantoprazole   40 mg Oral Daily   rosuvastatin   40 mg Oral Daily   sodium chloride  flush  5 mL Intracatheter Q8H   Continuous Infusions:  sodium chloride      piperacillin-tazobactam (ZOSYN)  IV 3.375 g (04/29/24 0603)    LOS: 0 days   Time spent: 50 minutes  Dr. Sherre [facility] If 7PM-7AM, please contact night-coverage 04/29/2024, 4:29 PM

## 2024-04-29 NOTE — Plan of Care (Signed)

## 2024-04-29 NOTE — TOC CM/SW Note (Signed)
 Transition of Care [facility]) CM/SW Note    Transition of Care [facility]) - Inpatient Brief Assessment   Patient Details  Name: Eathen Budreau MRN: [id number] Date of Birth: Jul 25, 1960  Transition of Care [facility]) CM/SW Contact:    Alfonso Rummer, LCSW Phone Number: 04/29/2024, 10:13 AM   Clinical Narrative:  TOC chart review completed. No TOC needs at this time. Please contact should needs arise.   Transition of Care Asessment: Insurance and Status: Selfpay Patient has primary care physician: Yes (BUTLER, KRISTINA) Home environment has been reviewed: single family home   Prior/Current Home Services: No current home services Social Drivers of Health Review: SDOH reviewed no interventions necessary Readmission risk has been reviewed: No Transition of care needs: no transition of care needs at this time

## 2024-04-29 NOTE — Hospital Course (Addendum)
 Mr. Jeffery Sweeney is a 63 year old male with history of hypertension, CAD, CVA status post PCI, ongoing tobacco use  04/27/2024: Presents emergency department for chief concerns of abdominal pain.  12/10: Patient admitted to [facility] service for chief concerns of abdominal pain suspect biliary colic.  General surgery was consulted.  12/11: General surgery was consulted.  HIDA scan was consistent with nonfilling of the gallbladder consistent with cystic duct obstruction and acute cholecystitis.  General surgery noted that given aspirin  and Plavix , history of CAD, high risk surgery.  IR consulted for percutaneous placement.  12/12: I assumed care of the patient.  IR procedure with drain placement today.  He received a lot of pain medication due to screaming and excruciating pain.  Patient received fentanyl  50 mcg x 2 doses since 1331, Dilaudid  0.5 mg x 2, Versed  1 mg x 2 doses since 1331.  He also received morphine  2 mg IV at 1412.  I discontinued postop fentanyl  and Dilaudid .  Patient now only has morphine  2 mg IV every 4 hours as needed for moderate pain, morphine  4 mg IV every 4 hours as needed for severe pain, 2 days ordered.  12/13: Patient was sleeping and easily arousable at bedside.  Patient endorsing still some pain.  As needed pain medication is ordered.  General surgery following.

## 2024-04-29 NOTE — OR Nursing (Signed)
 Message to Dr's Mugweru and Cox: what is the plan for this gentleman? When aroused  with loud voice, gentle shake he responds appropriately but readily falls asleep. temor is gone saturations 90 on 6 liters [place]. will he return to 2C? SInus tachycardiac but down to 131, bp 147/84

## 2024-04-29 NOTE — Procedures (Signed)
 Vascular and Interventional Radiology Procedure Note  Patient: Jeffery Sweeney DOB: [birth year] Medical Record Number: [id number] Note Date/Time: 04/29/2024 1:28 PM   Performing Physician: Thom Hall, MD Assistant(s): None  Diagnosis: Acute cholecystitis  Procedure:  CHOLECYSTOSTOMY TUBE PLACEMENT ANTEROGRADE CHOLANGIOGRAM  Anesthesia: Conscious Sedation Complications: None Estimated Blood Loss: Minimal Specimens:  None  Findings:  Successful placement of 36F cholecystostomy tube.  Plan: Flush tube w 5 mL sterile NS q8h and record drain output qShift. Follow up for routine tube evaluation in 6-8 week(s).   See detailed procedure note with images in PACS. The patient tolerated the procedure well without incident or complication and was returned to Recovery in stable condition.    Thom Hall, MD [facility]   Pager. [phone] Clinic. [phone]

## 2024-04-30 LAB — CBC
HCT: 37 % — ABNORMAL LOW (ref 39.0–52.0)
Hemoglobin: 12.1 g/dL — ABNORMAL LOW (ref 13.0–17.0)
MCH: 29.4 pg (ref 26.0–34.0)
MCHC: 32.7 g/dL (ref 30.0–36.0)
MCV: 90 fL (ref 80.0–100.0)
Platelets: 174 K/uL (ref 150–400)
RBC: 4.11 MIL/uL — ABNORMAL LOW (ref 4.22–5.81)
RDW: 13.4 % (ref 11.5–15.5)
WBC: 8.6 K/uL (ref 4.0–10.5)
nRBC: 0 % (ref 0.0–0.2)

## 2024-04-30 LAB — HEMOGLOBIN A1C
Hgb A1c MFr Bld: 6.6 % — ABNORMAL HIGH (ref 4.8–5.6)
Mean Plasma Glucose: 142.72 mg/dL

## 2024-04-30 LAB — BASIC METABOLIC PANEL WITH GFR
Anion gap: 10 (ref 5–15)
BUN: 21 mg/dL (ref 8–23)
CO2: 24 mmol/L (ref 22–32)
Calcium: 8.8 mg/dL — ABNORMAL LOW (ref 8.9–10.3)
Chloride: 103 mmol/L (ref 98–111)
Creatinine, Ser: 0.93 mg/dL (ref 0.61–1.24)
GFR, Estimated: >60 mL/min (ref >60)
Glucose, Bld: 159 mg/dL — ABNORMAL HIGH (ref 70–99)
Potassium: 3.7 mmol/L (ref 3.5–5.1)
Sodium: 137 mmol/L (ref 135–145)

## 2024-04-30 LAB — GLUCOSE, CAPILLARY
Glucose-Capillary: 127 mg/dL — ABNORMAL HIGH (ref 70–99)
Glucose-Capillary: 96 mg/dL (ref 70–99)
Glucose-Capillary: 89 mg/dL (ref 70–99)
Glucose-Capillary: 97 mg/dL (ref 70–99)

## 2024-04-30 MED ORDER — MORPHINE SULFATE (PF) 2 MG/ML IV SOLN
2.0000 mg | INTRAVENOUS | Status: DC | PRN
  Administered 2024-05-01: 2 mg via INTRAVENOUS
  Filled 2024-04-30: qty 1

## 2024-04-30 NOTE — Plan of Care (Signed)

## 2024-04-30 NOTE — Evaluation (Signed)
 Physical Therapy Evaluation Patient Details Name: Jeffery Sweeney MRN: [id number] DOB: 1961/02/17 Today's Date: 04/30/2024  History of Present Illness  63 year old man with HTN, CAD, CVA s/p PCI, ongoing tobacco use presented with acute onset of epigastric and RUQ abdominal pain without associated nausea vomiting or diarrhea.  RUQ US  revealed Cholelithiasis without sonographic evidence of acute cholecystitis and borderline dilated CBD at 8 mm unchanged from prior CT.   Clinical Impression  Pt received in supine and is agreeable for PT eval. At baseline, pt is IND with ADL's, IADL's, ambulation without AD, driving, and medication management.   Pt presents with increased O2 dependence from baseline, decreased standing balance, decreased activity tolerance, functional weakness, impaired safety awareness, and impulsivity result in impaired functional mobility from baseline. Due to deficits, pt required supervision-minA for bed mobility, minA for transfers with RW, and CGA to ambulate 55ft with RW. Impaired safety awareness, impulsivity, increased pain levels, decreased balance, and functional weakness increase his risk of falls and injury.  Deficits limit the pt's ability to safely and independently perform ADL's, transfer, and ambulate. Pt will benefit from acute skilled PT services to address deficits for return to baseline function. Pt will benefit from post acute therapy services to address deficits for return to baseline function.  Encourage OOB mobility with nursing and mobility tech for meals and toileting for continued progress towards goals and maintenance of IND with functional mobility while hospitalized.          If plan is discharge home, recommend the following: A little help with walking and/or transfers;A little help with bathing/dressing/bathroom;Assistance with cooking/housework;Direct supervision/assist for medications management;Assist for transportation;Help with stairs or  ramp for entrance   Can travel by private vehicle   Yes    Equipment Recommendations Rolling walker (2 wheels);BSC/3in1     Functional Status Assessment Patient has had a recent decline in their functional status and demonstrates the ability to make significant improvements in function in a reasonable and predictable amount of time.     Precautions / Restrictions Precautions Precautions: Fall Precaution/Restrictions Comments: fall risk Restrictions Weight Bearing Restrictions Per Provider Order: No      Mobility  Bed Mobility   Bed Mobility: Supine to Sit, Sit to Supine Rolling: Supervision   Supine to sit: Min assist Sit to supine: Supervision   General bed mobility comments: supervision to roll to the L in preparation for logroll technique with minA for trunk facilitation to sit EOB, HOB flat, use of BUE for support, increased reliance on handrails to facilitate transfer. supervision for safety to lie supine, HOB flat, use of BUE for support, increased time/effort for BLE facilitation onto bed.    Transfers Overall transfer level: Needs assistance Equipment used: Rolling walker (2 wheels)   Sit to Stand: Min assist           General transfer comment: minA for power to stand from EOB with RW and for controlled descent to sit EOB with RW; multimodal cues for safety, sequencing, hand placement, and RW proximity. demo's fair eccentric lowering.    Ambulation/Gait Ambulation/Gait assistance: Contact guard assist Gait Distance (Feet): 12 Feet Assistive device: Rolling walker (2 wheels)         General Gait Details: CGA for safety to ambulate in room with RW. demo's slowed cadence, guarded movement, decreased step length/foot clearance bilaterally. verbal cues for RW proximity. SpO2 at 90% on 4L after ambulation.    Balance Overall balance assessment: Needs assistance Sitting-balance support: Feet supported Sitting balance-Leahy Scale:  Good     Standing  balance support: Reliant on assistive device for balance, Bilateral upper extremity supported Standing balance-Leahy Scale: Fair                               Pertinent Vitals/Pain Pain Assessment Pain Assessment: 0-10 Pain Score: 7  Pain Location: abdomen Pain Descriptors / Indicators: Aching, Discomfort, Grimacing, Guarding, Shooting, Spasm Pain Intervention(s): Limited activity within patient's tolerance, Monitored during session, Repositioned    Home Living Family/patient expects to be discharged to:: Other (Comment) (boarding house) Living Arrangements: Alone Available Help at Discharge: Family;Available PRN/intermittently Type of Home: House Home Access: Stairs to enter Entrance Stairs-Rails: Lawyer of Steps: 9 steps Alternate Level Stairs-Number of Steps: 9 steps to floor with bed/bathroom Home Layout: Multi-level Home Equipment: Grab bars - toilet Additional Comments: Pt reports having no equipment at home.    Prior Function Prior Level of Function : Independent/Modified Independent;Driving;History of Falls (last six months)             Mobility Comments: IND ambulator without AD; hx of slip and fall ADLs Comments: IND with ADL's and IADL's     Extremity/Trunk Assessment   Upper Extremity Assessment Upper Extremity Assessment: Defer to OT evaluation    Lower Extremity Assessment Lower Extremity Assessment: Generalized weakness       Communication   Communication Communication: No apparent difficulties    Cognition Arousal: Alert Behavior During Therapy: WFL for tasks assessed/performed   PT - Cognitive impairments: Safety/Judgement                         Following commands: Intact       Cueing Cueing Techniques: Verbal cues     General Comments General comments (skin integrity, edema, etc.): SpO2 89-90% at rest on 2L, unable to recover despite cues for pursed lip breathing. SpO2 89-91% on 3L at  rest, unable to improve sats with pursed lip breathing. Titrated to 4L for OOB mobility, SpO2 desat to 90% after ambulation.    Exercises Other Exercises Other Exercises: Pt edu re: PT role/POC, DC recommendations, OOB for toileting and meals, call for help, O2 dependence, pursed lip breathing, safety with functional mobility, rehab prognosis, increased level of assist expected at DC, splinting for pain management with coughing.   Assessment/Plan    PT Assessment Patient needs continued PT services  PT Problem List Decreased strength;Decreased activity tolerance;Decreased balance;Decreased mobility;Decreased cognition;Decreased safety awareness;Cardiopulmonary status limiting activity;Pain       PT Treatment Interventions DME instruction;Gait training;Stair training;Functional mobility training;Therapeutic activities;Therapeutic exercise;Balance training;Neuromuscular re-education;Patient/family education;Cognitive remediation    PT Goals (Current goals can be found in the Care Plan section)  Acute Rehab PT Goals Patient Stated Goal: get better PT Goal Formulation: With patient Time For Goal Achievement: 05/14/24 Potential to Achieve Goals: Good    Frequency Min 2X/week        AM-PAC PT 6 Clicks Mobility  Outcome Measure Help needed turning from your back to your side while in a flat bed without using bedrails?: A Little Help needed moving from lying on your back to sitting on the side of a flat bed without using bedrails?: A Little Help needed moving to and from a bed to a chair (including a wheelchair)?: A Little Help needed standing up from a chair using your arms (e.g., wheelchair or bedside chair)?: A Little Help needed to walk in hospital room?:  A Little Help needed climbing 3-5 steps with a railing? : A Little 6 Click Score: 18    End of Session Equipment Utilized During Treatment: Gait belt;Oxygen (2-4L O2 via [place]) Activity Tolerance: Patient tolerated treatment  well Patient left: in bed;with call bell/phone within reach;with bed alarm set Nurse Communication: Mobility status PT Visit Diagnosis: Unsteadiness on feet (R26.81);Muscle weakness (generalized) (M62.81);Difficulty in walking, not elsewhere classified (R26.2)    Time: 8587-8561 PT Time Calculation (min) (ACUTE ONLY): 26 min   Charges:   PT Evaluation $PT Eval Moderate Complexity: 1 Mod PT Treatments $Therapeutic Activity: 8-22 mins PT General Charges $$ ACUTE PT VISIT: 1 Visit         Camie CHARLENA Kluver, PT, DPT 3:30 PM,04/30/2024 Physical Therapist - [facility]

## 2024-04-30 NOTE — Progress Notes (Signed)
 Patient ID: Jeffery Sweeney, male   DOB: [birth year], 63 y.o.   MRN: [id number]     SURGICAL PROGRESS NOTE   Hospital Day(s): 1.   Interval History: Patient seen and examined, no acute events or new complaints overnight. Patient reports feeling sore from the procedure.  He denies any nausea or vomiting.  He endorses that he tolerated clear liquid diet.  Pain localized to the right side of the abdomen/flank.  Patient had percutaneous cholecystostomy placed yesterday.  No fever no sign of complication at this moment.  Vital signs in last 24 hours: [min-max] current  Temp:  [95.4 F (35.2 C)-99.4 F (37.4 C)] 98.4 F (36.9 C) (12/13 0833) Pulse Rate:  [0-148] 100 (12/13 0833) Resp:  [13-29] 16 (12/13 0833) BP: (108-212)/(69-176) 127/75 (12/13 0833) SpO2:  [87 %-100 %] 87 % (12/13 0833) Weight:  [83.8 kg-86.6 kg] 86.6 kg (12/13 0613)     Height: 5' 3.5 (161.3 cm) Weight: 86.6 kg BMI (Calculated): 33.29   Physical Exam:  Constitutional: alert, cooperative and no distress  Respiratory: breathing non-labored at rest  Cardiovascular: regular rate and sinus rhythm  Gastrointestinal: soft, mild-tender in the right upper quadrant, and non-distended.  Percutaneous cholecystostomy in place in the right flank.  Labs:     Latest Ref Rng & Units 04/29/2024    5:22 AM 04/28/2024    6:09 AM 04/27/2024    8:52 PM  CBC  WBC 4.0 - 10.5 K/uL 13.7  14.1  9.1   Hemoglobin 13.0 - 17.0 g/dL 86.7  85.3  84.5   Hematocrit 39.0 - 52.0 % 39.4  43.1  44.6   Platelets 150 - 400 K/uL 193  224  278       Latest Ref Rng & Units 04/29/2024    5:22 AM 04/28/2024    6:09 AM 04/27/2024    8:52 PM  CMP  Glucose 70 - 99 mg/dL 870  843  854   BUN 8 - 23 mg/dL 14  12  10    Creatinine 0.61 - 1.24 mg/dL 8.95  9.03  8.91   Sodium 135 - 145 mmol/L 137  138  139   Potassium 3.5 - 5.1 mmol/L 4.1  4.4  4.1   Chloride 98 - 111 mmol/L 103  102  101   CO2 22 - 32 mmol/L 25  24  25    Calcium  8.9 - 10.3 mg/dL 8.9   9.2  9.3   Total Protein 6.5 - 8.1 g/dL 6.6  7.0  7.6   Total Bilirubin 0.0 - 1.2 mg/dL 2.7  1.1  1.0   Alkaline Phos 38 - 126 U/L 46  55  61   AST 15 - 41 U/L 16  23  23    ALT 0 - 44 U/L 10  18  20      Imaging studies: Personally evaluated the images of the HIDA scan confirming nonvisualization of the gallbladder consistent with cholecystitis.  I also evaluated the images of the percutaneous cholecystostomy tube placement.  Drain seems to be in place.   Assessment/Plan:  63 y.o. male with acute cholecystitis s/p percutaneous cholecystostomy, complicated by pertinent comorbidities including history of CVA, hypertension, coronary artery disease status post PCI and stent placement chronic antiplatelet therapy, smoker.  -Due to chronic dual antiplatelet therapy and medical abilities patient treated with percutaneous cholecystostomy and antibiotic therapy - Today seems to be doing well with adequate drainage from cholecystostomy drain. - Still will my leukocytosis.  There has been no fever -  Patient tolerated clear liquid diet.  Will advance to soft diet - Continue antibiotic therapy and management as per primary team - Will continue to follow along  Lucas Petrin, MD

## 2024-04-30 NOTE — Progress Notes (Signed)
 PROGRESS NOTE  Jeffery Sweeney  FMW:[id number] DOB: May 14, 1961 DOA: 04/27/2024 PCP: Towana Small, FNP   Mr. Jeffery Sweeney is a 63 year old male with history of hypertension, CAD, CVA status post PCI, ongoing tobacco use  04/27/2024: Presents emergency department for chief concerns of abdominal pain.  12/10: Patient admitted to [facility] for chief concerns of abdominal pain suspect biliary colic.  General surgery was consulted.  12/11: General surgery was consulted.  HIDA scan was consistent with nonfilling of the gallbladder consistent with cystic duct obstruction and acute cholecystitis.  General surgery noted that given aspirin  and Plavix , history of CAD, high risk surgery.  IR consulted for percutaneous placement.  12/12: I assumed care of the patient.  IR procedure with drain placement today.  He received a lot of pain medication due to screaming and excruciating pain.  Patient received fentanyl  50 mcg x 2 doses since 1331, Dilaudid  0.5 mg x 2, Versed  1 mg x 2 doses since 1331.  He also received morphine  2 mg IV at 1412.  I discontinued postop fentanyl  and Dilaudid .  Patient now only has morphine  2 mg IV every 4 hours as needed for moderate pain, morphine  4 mg IV every 4 hours as needed for severe pain, 2 days ordered.  12/13: Patient was sleeping and easily arousable at bedside.  Patient endorsing still some pain.  As needed pain medication is ordered.  General surgery following.  Assessment & Plan:   Principal Problem:   Biliary colic Active Problems:   Dyslipidemia   Coronary artery disease   Controlled type 2 diabetes mellitus without complication, without long-term current use of insulin  (HCC)   Essential hypertension    Assessment and Plan: * Biliary colic - The patient will be admitted to a medical telemetry observation bed. - He will be kept NPO. - Will continue PPI therapy. - General Surgery consult will be obtained. - I notified Dr.  Desiderio about the patient.  04/29/2024: Patient got IR drain today.  Per midlevel, patient did not responded well to the IR drain and required pain medication.  Patient then developed rigors and required Demerol  and this was given by interventional radiologist.  At bedside there is a drain placed and dressing appears to be clean with no active signs of bleeding at the dressing.  There is about 100 mL of bile dark fluid that has been drained. Continue Zosyn  per pharmacy at this time.  Patient will need to be taught how to flush the drain once he goes home.  Nursing is aware that they need to help patient/family with this education.  Per general surgery.  12/13: Morphine  2 mg IV every 4 hours as needed for moderate pain, 1 more day.  Morphine  4 mg IV every 4 hours as needed for severe pain, 1 more day.  Patient can be transition to oral pain medication tomorrow.  Continue Zosyn  per pharmacy.  Essential hypertension Home metoprolol  50 mg p.o. twice daily, Imdur  60 mg p.o. twice daily  12/12: Hydralazine  5 mg IV every 6 hours as needed for SBP greater 170, 5 days ordered  Controlled type 2 diabetes mellitus without complication, without long-term current use of insulin  (HCC) - The patient will be placed on supplemental coverage with NovoLog . - Will hold off metformin .  12/12: Insulin  SSI with at bedtime coverage ordered  Coronary artery disease - Serial troponins will be followed. - Will continue Imdur  and beta-blocker therapy as well as statin therapy. - We will hold off aspirin  and  Plavix  for now.  12/12: Aspirin  and Plavix  resumed for 12/13.  Dyslipidemia Rosuvastatin  40 mg daily  DVT prophylaxis: Enoxaparin  Code Status: Full code Family Communication: No Disposition Plan: Pending clinical course, pending final general surgery recommendation Level of care: Telemetry  Consultants:  General Surgery, IR  Procedures:  12/12: Percutaneous cholecystostomy via  IR  Antimicrobials: 12/13:Zosyn , day 3  Subjective:  At bedside, patient was sleeping and easily arousable.  Patient was able to tell me his first last name, age, location, current calendar year.  He does not appear to be in acute distress.  When I asked, he reports that he is hurting.  I reminded him that there is add as needed pain medication for him.  Objective: Vitals:   04/29/24 2346 04/30/24 0359 04/30/24 0613 04/30/24 0833  BP: 108/76 119/69  127/75  Pulse: 99 99  100  Resp:  16  16  Temp:  98.1 F (36.7 C)  98.4 F (36.9 C)  TempSrc:      SpO2: 95% (!) 87%  97%  Weight:   86.6 kg   Height:        Intake/Output Summary (Last 24 hours) at 04/30/2024 1454 Last data filed at 04/30/2024 1424 Gross per 24 hour  Intake 1102.75 ml  Output 720 ml  Net 382.75 ml   Filed Weights   04/27/24 2316 04/29/24 2240 04/30/24 0613  Weight: 91 kg 83.8 kg 86.6 kg   Examination:  General exam: Appears calm and comfortable  Respiratory system: Clear to auscultation. Respiratory effort normal. Cardiovascular system: S1 & S2 heard, RRR. No JVD, murmurs, rubs, gallops or clicks. No pedal edema. Gastrointestinal system: Obese abdomen.  Drain is present.  Mild abdominal tenderness.  Abdomen is nondistended. No organomegaly or masses felt. Normal bowel sounds heard. Central nervous system: Alert and oriented. No focal neurological deficits. Extremities: Symmetric 5 x 5 power. Skin: No rashes, lesions or ulcers Psychiatry: Judgement and insight appear normal. Mood & affect appropriate.   Data Reviewed: I have personally reviewed following labs and imaging studies  CBC: Recent Labs  Lab 04/27/24 2052 04/28/24 0609 04/29/24 0522 04/30/24 1003  WBC 9.1 14.1* 13.7* 8.6  HGB 15.4 14.6 13.2 12.1*  HCT 44.6 43.1 39.4 37.0*  MCV 87.1 88.9 89.1 90.0  PLT 278 224 193 174   Basic Metabolic Panel: Recent Labs  Lab 04/27/24 2052 04/28/24 0609 04/29/24 0522 04/30/24 1003  NA 139 138  137 137  K 4.1 4.4 4.1 3.7  CL 101 102 103 103  CO2 25 24 25 24   GLUCOSE 145* 156* 129* 159*  BUN 10 12 14 21   CREATININE 1.08 0.96 1.04 0.93  CALCIUM  9.3 9.2 8.9 8.8*   GFR: Estimated Creatinine Clearance: 78.9 mL/min (by C-G formula based on SCr of 0.93 mg/dL).  Liver Function Tests: Recent Labs  Lab 04/27/24 2052 04/28/24 0609 04/29/24 0522  AST 23 23 16   ALT 20 18 10   ALKPHOS 61 55 46  BILITOT 1.0 1.1 2.7*  PROT 7.6 7.0 6.6  ALBUMIN  4.3 4.0 3.5   Recent Labs  Lab 04/27/24 2052  LIPASE 24   Coagulation Profile: Recent Labs  Lab 04/28/24 1656  INR 1.1   HbA1C: Recent Labs    04/30/24 0442  HGBA1C 6.6*   CBG: Recent Labs  Lab 04/29/24 1727 04/29/24 2214 04/30/24 0840 04/30/24 1226  GLUCAP 141* 138* 127* 96   Recent Results (from the past 240 hours)  Aerobic/Anaerobic Culture w Gram Stain (surgical/deep wound)  Status: None (Preliminary result)   Collection Time: 04/29/24  1:28 PM   Specimen: BILE  Result Value Ref Range Status   Specimen Description   Final    BILE Performed at [facility], [address] [place], [place] [address]    Special Requests   Final    NONE Performed at [facility], [address] [place], [place] [address]    Gram Stain   Final    ABUNDANT WBC PRESENT, PREDOMINANTLY PMN ABUNDANT GRAM NEGATIVE RODS    Culture   Final    ABUNDANT GRAM NEGATIVE RODS IDENTIFICATION AND SUSCEPTIBILITIES TO FOLLOW Performed at [facility], [address], [place], [place] [address]    Report Status PENDING  Incomplete    Radiology Studies: IR Perc Cholecystostomy Result Date: 04/29/2024 INDICATION: Acute cholecystitis. EXAM: ULTRASOUND AND FLUOROSCOPIC-GUIDED CHOLECYSTOSTOMY TUBE PLACEMENT COMPARISON:  NM HIDA, 04/28/2024.  US  Abdomen, 04/27/2024. MEDICATIONS: The patient is currently admitted to the hospital and on intravenous antibiotics. Antibiotics were administered within an appropriate time  frame prior to skin puncture. 0.5 mg Dilaudid  IV and 25 mg Demerol  IV ANESTHESIA/SEDATION: Moderate (conscious) sedation was employed during this procedure. A total of Versed  1 mg and Fentanyl  100 mcg was administered intravenously. Moderate Sedation Time: 21 minutes. The patient's level of consciousness and vital signs were monitored continuously by radiology nursing throughout the procedure under my direct supervision. CONTRAST:  10mL OMNIPAQUE  IOHEXOL  300 MG/ML SOLN - administered into the gallbladder fossa. FLUOROSCOPY TIME:  Fluoroscopic dose; 12.6 mGy COMPLICATIONS: None immediate. PROCEDURE: Informed written consent was obtained from the patient after a discussion of the risks, benefits and alternatives to treatment. Questions regarding the procedure were encouraged and answered. A timeout was performed prior to the initiation of the procedure. The right upper abdominal quadrant was prepped and draped in the usual sterile fashion, and a sterile drape was applied covering the operative field. Maximum barrier sterile technique with sterile gowns and gloves were used for the procedure. A timeout was performed prior to the initiation of the procedure. Local anesthesia was provided with 1% lidocaine  with epinephrine . Ultrasound scanning of the RIGHT upper quadrant demonstrates a distended gallbladder. Of note, the patient reported pain with ultrasound imaging over the gallbladder. Utilizing a transhepatic approach, a 22 gauge needle was advanced into the gallbladder under direct ultrasound guidance. An ultrasound image was saved for documentation purposes. Appropriate intraluminal puncture was confirmed with the efflux of bile and advancement of an 0.018 wire into the gallbladder lumen. The needle was exchanged for an Accustick set. A small amount of contrast was injected to confirm appropriate intraluminal positioning. Over a Benson wire, a 10 Fr cholecystomy tube was advanced into the gallbladder fossa, coiled  and locked. Bile was aspirated and a small amount of contrast was injected as several post procedural spot radiographic images were obtained in various obliquities. The catheter was secured to the skin with suture, connected to a drainage bag and a dressing was placed. The patient tolerated the procedure well without immediate post procedural complication. IMPRESSION: Successful ultrasound and fluoroscopic guided placement of a 10 Fr cholecystostomy tube. The patient developed rigors acutely s/p drain placement. This was managed with medications, and his primary team was notified. RECOMMENDATIONS: The patient will return to V[facility] Inter[facility]iology (VIR) for routine drainage catheter evaluation and exchange in 6-8 weeks. Thom Hall, MD [facility] Radiology Electronically Signed   By: Thom Hall M.D.   On: 04/29/2024 14:56  NM Hepatobiliary Liver Func Result Date: 04/28/2024 EXAM: NM HEPATOBILLARY SCAN 04/28/2024 03:49:19 PM TECHNIQUE: RADIOPHARMACEUTICAL: 5.38 mCi Tc-13m mebrofenin  Dynamic images of the abdomen and pelvis were obtained in the anterior projection for 1 hour after intravenous administration of radiopharmaceutical. 3 mg IV morphine  was administered and imaging continued for at least 30 minutes. COMPARISON: Ultrasound. CLINICAL HISTORY: RUQ abdominal pain, biliary disease suspected, US  nondiagnostic. FINDINGS: Homogenous uptake within the liver. Normal clearance of the blood pool. Appropriate excretion into the biliary system. Non-filling of the gallbladder at 60 minutes. The gallbladder failed to fill after morphine  augmentation. Activity is visualized in the small bowel. IMPRESSION: 1. Non-filling of the gallbladder after morphine  augmentation concerning for cystic duct obstruction / acute cholecystitis. 2. Patent common bile duct . 3. these results will be called to the ordering clinician or representative by the radiologist  assistant, and communication documented in the pacs or clario dashboard Electronically signed by: Norleen Boxer MD 04/28/2024 03:57 PM EST RP Workstation: [id number]   Scheduled Meds:  aspirin   81 mg Oral Daily   clopidogrel   75 mg Oral QPM   enoxaparin  (LOVENOX ) injection  40 mg Subcutaneous Q24H   insulin  aspart  0-15 Units Subcutaneous TID WC   insulin  aspart  0-5 Units Subcutaneous QHS   isosorbide  mononitrate  60 mg Oral BID   metoprolol  tartrate  50 mg Oral BID   pantoprazole   40 mg Oral Daily   rosuvastatin   40 mg Oral Daily   sodium chloride  flush  5 mL Intracatheter Q8H   Continuous Infusions:  sodium chloride  100 mL/hr at 04/30/24 0518   piperacillin -tazobactam (ZOSYN )  IV 3.375 g (04/30/24 0906)    LOS: 1 day   Time spent: 50 minutes  Dr. Sherre [facility] If 7PM-7AM, please contact night-coverage 04/30/2024, 2:54 PM

## 2024-05-01 DIAGNOSIS — K810 Acute cholecystitis: Secondary | ICD-10-CM

## 2024-05-01 LAB — CBC
HCT: 36.3 % — ABNORMAL LOW (ref 39.0–52.0)
Hemoglobin: 11.8 g/dL — ABNORMAL LOW (ref 13.0–17.0)
MCH: 29.8 pg (ref 26.0–34.0)
MCHC: 32.5 g/dL (ref 30.0–36.0)
MCV: 91.7 fL (ref 80.0–100.0)
Platelets: 197 K/uL (ref 150–400)
RBC: 3.96 MIL/uL — ABNORMAL LOW (ref 4.22–5.81)
RDW: 13.2 % (ref 11.5–15.5)
WBC: 8.7 K/uL (ref 4.0–10.5)
nRBC: 0 % (ref 0.0–0.2)

## 2024-05-01 LAB — GLUCOSE, CAPILLARY
Glucose-Capillary: 103 mg/dL — ABNORMAL HIGH (ref 70–99)
Glucose-Capillary: 157 mg/dL — ABNORMAL HIGH (ref 70–99)
Glucose-Capillary: 121 mg/dL — ABNORMAL HIGH (ref 70–99)
Glucose-Capillary: 125 mg/dL — ABNORMAL HIGH (ref 70–99)

## 2024-05-01 MED ORDER — MORPHINE SULFATE (PF) 2 MG/ML IV SOLN
2.0000 mg | INTRAVENOUS | Status: DC | PRN
  Administered 2024-05-01 – 2024-05-02 (×2): 2 mg via INTRAVENOUS
  Filled 2024-05-01 (×2): qty 1

## 2024-05-01 MED ORDER — OXYCODONE-ACETAMINOPHEN 5-325 MG PO TABS: 1.0000 | ORAL_TABLET | Freq: Four times a day (QID) | ORAL | Status: DC | PRN

## 2024-05-01 NOTE — Progress Notes (Signed)
 PROGRESS NOTE    Jeffery Sweeney  FMW:[id number] DOB: [birth year] DOA: 04/27/2024 PCP: Towana Small, FNP    Assessment & Plan:   Principal Problem:   Biliary colic Active Problems:   Dyslipidemia   Coronary artery disease   Controlled type 2 diabetes mellitus without complication, without long-term current use of insulin  (HCC)   Essential hypertension  Assessment and Plan: Acute cholecystitis: s/p percutaneous cholecystectomy drain placement. Continue on IV zosyn . Gen surg following and recs apprec   HTN: continue on home dose of metoprolol , imdur     DM2: well controlled, HbA1c 6.6 Continue on SSI w/ accuchecks   Hx of CAD: continue on metoprolol , imdur , statin, aspirin , plavix    HLD: continue on statin   Obesity: BMI 33.2. Complicates overall care & prognosis     DVT prophylaxis: lovenox   Code Status: full  Family Communication:  Disposition Plan: likely d/c back home vs SNF   Status is: Inpatient Remains inpatient appropriate because: severity of illness     Level of care: Telemetry Consultants:  IR Gen surg   Procedures:   Antimicrobials: zosyn    Subjective: Pt c/o malaise  Objective: Vitals:   04/30/24 1613 04/30/24 2004 05/01/24 0534 05/01/24 0742  BP: 129/77 130/77 137/79 (!) 141/84  Pulse: 95 95 93 97  Resp: 16 16  18   Temp: 98.6 F (37 C) 98.1 F (36.7 C) 98.4 F (36.9 C) 98.2 F (36.8 C)  TempSrc:   Oral   SpO2: 96% 93% 95% 93%  Weight:      Height:        Intake/Output Summary (Last 24 hours) at 05/01/2024 1015 Last data filed at 05/01/2024 0957 Gross per 24 hour  Intake 2307.73 ml  Output 1020 ml  Net 1287.73 ml   Filed Weights   04/27/24 2316 04/29/24 2240 04/30/24 0613  Weight: 91 kg 83.8 kg 86.6 kg    Examination:  General exam: Appears calm and comfortable  Respiratory system: decreased breath sounds b/l  Cardiovascular system: S1 & S2+. No rubs, gallops or clicks.  Gastrointestinal system: Abdomen is  obese, soft and nontender. Normal bowel sounds heard. Central nervous system: Alert and oriented. Moves all extremities Psychiatry: Judgement and insight appear normal. Flat mood and affect   Data Reviewed: I have personally reviewed following labs and imaging studies  CBC: Recent Labs  Lab 04/27/24 2052 04/28/24 0609 04/29/24 0522 04/30/24 1003 05/01/24 0449  WBC 9.1 14.1* 13.7* 8.6 8.7  HGB 15.4 14.6 13.2 12.1* 11.8*  HCT 44.6 43.1 39.4 37.0* 36.3*  MCV 87.1 88.9 89.1 90.0 91.7  PLT 278 224 193 174 197   Basic Metabolic Panel: Recent Labs  Lab 04/27/24 2052 04/28/24 0609 04/29/24 0522 04/30/24 1003  NA 139 138 137 137  K 4.1 4.4 4.1 3.7  CL 101 102 103 103  CO2 25 24 25 24   GLUCOSE 145* 156* 129* 159*  BUN 10 12 14 21   CREATININE 1.08 0.96 1.04 0.93  CALCIUM  9.3 9.2 8.9 8.8*   GFR: Estimated Creatinine Clearance: 78.9 mL/min (by C-G formula based on SCr of 0.93 mg/dL). Liver Function Tests: Recent Labs  Lab 04/27/24 2052 04/28/24 0609 04/29/24 0522  AST 23 23 16   ALT 20 18 10   ALKPHOS 61 55 46  BILITOT 1.0 1.1 2.7*  PROT 7.6 7.0 6.6  ALBUMIN  4.3 4.0 3.5   Recent Labs  Lab 04/27/24 2052  LIPASE 24   No results for input(s): AMMONIA in the last 168 hours. Coagulation Profile: Recent Labs  Lab 04/28/24 1656  INR 1.1   Cardiac Enzymes: No results for input(s): CKTOTAL, CKMB, CKMBINDEX, TROPONINI in the last 168 hours. BNP (last 3 results) No results for input(s): PROBNP in the last 8760 hours. HbA1C: Recent Labs    04/30/24 0442  HGBA1C 6.6*   CBG: Recent Labs  Lab 04/30/24 0840 04/30/24 1226 04/30/24 1639 04/30/24 2138 05/01/24 0739  GLUCAP 127* 96 89 97 103*   Lipid Profile: No results for input(s): CHOL, HDL, LDLCALC, TRIG, CHOLHDL, LDLDIRECT in the last 72 hours. Thyroid  Function Tests: No results for input(s): TSH, T4TOTAL, FREET4, T3FREE, THYROIDAB in the last 72 hours. Anemia Panel: No  results for input(s): VITAMINB12, FOLATE, FERRITIN, TIBC, IRON, RETICCTPCT in the last 72 hours. Sepsis Labs: No results for input(s): PROCALCITON, LATICACIDVEN in the last 168 hours.  Recent Results (from the past 240 hours)  Aerobic/Anaerobic Culture w Gram Stain (surgical/deep wound)     Status: None (Preliminary result)   Collection Time: 04/29/24  1:28 PM   Specimen: BILE  Result Value Ref Range Status   Specimen Description   Final    BILE Performed at [facility], [address] [place], [place] [address]    Special Requests   Final    NONE Performed at [facility], [address] [place], [place] [address]    Gram Stain   Final    ABUNDANT WBC PRESENT, PREDOMINANTLY PMN ABUNDANT GRAM NEGATIVE RODS Performed at [facility], [address] [place], [place] [address]    Culture ABUNDANT KLEBSIELLA PNEUMONIAE  Final   Report Status PENDING  Incomplete   Organism ID, Bacteria KLEBSIELLA PNEUMONIAE  Final      Susceptibility   Klebsiella pneumoniae - MIC*    AMPICILLIN RESISTANT Resistant     CEFAZOLIN (NON-URINE) 2 SENSITIVE Sensitive     CEFEPIME  <=0.12 SENSITIVE Sensitive     ERTAPENEM <=0.12 SENSITIVE Sensitive     CEFTRIAXONE  <=0.25 SENSITIVE Sensitive     CIPROFLOXACIN  <=0.06 SENSITIVE Sensitive     GENTAMICIN <=1 SENSITIVE Sensitive     MEROPENEM <=0.25 SENSITIVE Sensitive     TRIMETH/SULFA <=20 SENSITIVE Sensitive     AMPICILLIN/SULBACTAM <=2 SENSITIVE Sensitive     PIP/TAZO Value in next row Sensitive      <=4 SENSITIVEThis is a modified FDA-approved test that has been validated and its performance characteristics determined by the reporting laboratory.  This laboratory is certified under the Clinical Laboratory Improvement Amendments CLIA as qualified to perform high complexity clinical laboratory testing.    * ABUNDANT KLEBSIELLA PNEUMONIAE         Radiology Studies: IR Perc Cholecystostomy Result Date:  04/29/2024 INDICATION: Acute cholecystitis. EXAM: ULTRASOUND AND FLUOROSCOPIC-GUIDED CHOLECYSTOSTOMY TUBE PLACEMENT COMPARISON:  NM HIDA, 04/28/2024.  US  Abdomen, 04/27/2024. MEDICATIONS: The patient is currently admitted to the hospital and on intravenous antibiotics. Antibiotics were administered within an appropriate time frame prior to skin puncture. 0.5 mg Dilaudid  IV and 25 mg Demerol  IV ANESTHESIA/SEDATION: Moderate (conscious) sedation was employed during this procedure. A total of Versed  1 mg and Fentanyl  100 mcg was administered intravenously. Moderate Sedation Time: 21 minutes. The patient's level of consciousness and vital signs were monitored continuously by radiology nursing throughout the procedure under my direct supervision. CONTRAST:  10mL OMNIPAQUE  IOHEXOL  300 MG/ML SOLN - administered into the gallbladder fossa. FLUOROSCOPY TIME:  Fluoroscopic dose; 12.6 mGy COMPLICATIONS: None immediate. PROCEDURE: Informed written consent was obtained from the patient after a discussion of the risks, benefits and alternatives  to treatment. Questions regarding the procedure were encouraged and answered. A timeout was performed prior to the initiation of the procedure. The right upper abdominal quadrant was prepped and draped in the usual sterile fashion, and a sterile drape was applied covering the operative field. Maximum barrier sterile technique with sterile gowns and gloves were used for the procedure. A timeout was performed prior to the initiation of the procedure. Local anesthesia was provided with 1% lidocaine  with epinephrine . Ultrasound scanning of the RIGHT upper quadrant demonstrates a distended gallbladder. Of note, the patient reported pain with ultrasound imaging over the gallbladder. Utilizing a transhepatic approach, a 22 gauge needle was advanced into the gallbladder under direct ultrasound guidance. An ultrasound image was saved for documentation purposes. Appropriate intraluminal puncture  was confirmed with the efflux of bile and advancement of an 0.018 wire into the gallbladder lumen. The needle was exchanged for an Accustick set. A small amount of contrast was injected to confirm appropriate intraluminal positioning. Over a Benson wire, a 10 Fr cholecystomy tube was advanced into the gallbladder fossa, coiled and locked. Bile was aspirated and a small amount of contrast was injected as several post procedural spot radiographic images were obtained in various obliquities. The catheter was secured to the skin with suture, connected to a drainage bag and a dressing was placed. The patient tolerated the procedure well without immediate post procedural complication. IMPRESSION: Successful ultrasound and fluoroscopic guided placement of a 10 Fr cholecystostomy tube. The patient developed rigors acutely s/p drain placement. This was managed with medications, and his primary team was notified. RECOMMENDATIONS: The patient will return to Vascular Interventional Radiology (VIR) for routine drainage catheter evaluation and exchange in 6-8 weeks. Thom Hall, MD [facility] Electronically Signed   By: Thom Hall M.D.   On: 04/29/2024 14:56        Scheduled Meds:  aspirin   81 mg Oral Daily   clopidogrel   75 mg Oral QPM   enoxaparin  (LOVENOX ) injection  40 mg Subcutaneous Q24H   insulin  aspart  0-15 Units Subcutaneous TID WC   insulin  aspart  0-5 Units Subcutaneous QHS   isosorbide  mononitrate  60 mg Oral BID   metoprolol  tartrate  50 mg Oral BID   pantoprazole   40 mg Oral Daily   rosuvastatin   40 mg Oral Daily   sodium chloride  flush  5 mL Intracatheter Q8H   Continuous Infusions:  piperacillin -tazobactam (ZOSYN )  IV 3.375 g (05/01/24 0956)     LOS: 2 days       Anthony CHRISTELLA Pouch, MD [facility] Pager [phone]  If 7PM-7AM, please contact night-coverage [email or web address] 05/01/2024, 10:15 AM

## 2024-05-01 NOTE — Progress Notes (Signed)
 Patient ID: Jeffery Sweeney, male   DOB: December 15, 1960, 63 y.o.   MRN: [id number]     SURGICAL PROGRESS NOTE   Hospital Day(s): 2.   Interval History: Patient seen and examined, no acute events or new complaints overnight. Patient reports still feeling very sore on the right side of the abdomen.  He endorses that he does not have an appetite and did not eat anything yesterday.  Pain localized to the right upper quadrant and right flank.  No pain radiation.  He cannot identify any alleviating or aggravating factors.  He denies any fever.  He denies any nausea or vomiting.  Vital signs in last 24 hours: [min-max] current  Temp:  [98.1 F (36.7 C)-98.6 F (37 C)] 98.2 F (36.8 C) (12/14 0742) Pulse Rate:  [93-97] 97 (12/14 0742) Resp:  [16-18] 18 (12/14 0742) BP: (129-141)/(77-84) 141/84 (12/14 0742) SpO2:  [93 %-96 %] 93 % (12/14 0742)     Height: 5' 3.5 (161.3 cm) Weight: 86.6 kg BMI (Calculated): 33.29   Physical Exam:  Constitutional: alert, cooperative and no distress  Respiratory: breathing non-labored at rest  Cardiovascular: regular rate and sinus rhythm  Gastrointestinal: soft, mild-tender, and non-distended.  Drain in place.  Labs:     Latest Ref Rng & Units 05/01/2024    4:49 AM 04/30/2024   10:03 AM 04/29/2024    5:22 AM  CBC  WBC 4.0 - 10.5 K/uL 8.7  8.6  13.7   Hemoglobin 13.0 - 17.0 g/dL 88.1  87.8  86.7   Hematocrit 39.0 - 52.0 % 36.3  37.0  39.4   Platelets 150 - 400 K/uL 197  174  193       Latest Ref Rng & Units 04/30/2024   10:03 AM 04/29/2024    5:22 AM 04/28/2024    6:09 AM  CMP  Glucose 70 - 99 mg/dL 840  870  843   BUN 8 - 23 mg/dL 21  14  12    Creatinine 0.61 - 1.24 mg/dL 9.06  8.95  9.03   Sodium 135 - 145 mmol/L 137  137  138   Potassium 3.5 - 5.1 mmol/L 3.7  4.1  4.4   Chloride 98 - 111 mmol/L 103  103  102   CO2 22 - 32 mmol/L 24  25  24    Calcium  8.9 - 10.3 mg/dL 8.8  8.9  9.2   Total Protein 6.5 - 8.1 g/dL  6.6  7.0   Total Bilirubin 0.0  - 1.2 mg/dL  2.7  1.1   Alkaline Phos 38 - 126 U/L  46  55   AST 15 - 41 U/L  16  23   ALT 0 - 44 U/L  10  18     Imaging studies: No new pertinent imaging studies   Assessment/Plan:  63 y.o. male with acute cholecystitis s/p percutaneous cholecystostomy, complicated by pertinent comorbidities including history of CVA, hypertension, coronary artery disease status post PCI and stent placement chronic antiplatelet therapy, smoker.   -Due to chronic dual antiplatelet therapy and medical abilities patient treated with percutaneous cholecystostomy and antibiotic therapy - No sign of clinical deterioration.  No fever. - Drain working adequately - Patient still significantly deconditioned and without appetite - Will need to tolerate diet better before being able to be discharged.  Lucas Petrin, MD

## 2024-05-01 NOTE — Plan of Care (Signed)

## 2024-05-02 ENCOUNTER — Other Ambulatory Visit: Payer: Self-pay

## 2024-05-02 LAB — GLUCOSE, CAPILLARY
Glucose-Capillary: 118 mg/dL — ABNORMAL HIGH (ref 70–99)
Glucose-Capillary: 160 mg/dL — ABNORMAL HIGH (ref 70–99)
Glucose-Capillary: 96 mg/dL (ref 70–99)
Glucose-Capillary: 142 mg/dL — ABNORMAL HIGH (ref 70–99)
Glucose-Capillary: 114 mg/dL — ABNORMAL HIGH (ref 70–99)
Glucose-Capillary: 107 mg/dL — ABNORMAL HIGH (ref 70–99)

## 2024-05-02 MED ORDER — SODIUM CHLORIDE FLUSH 0.9 % IV SOLN
5.0000 mL | Freq: Every day | INTRAVENOUS | 1 refills | Status: AC
  Filled 2024-05-02: qty 150, 30d supply, fill #0

## 2024-05-02 MED ORDER — SODIUM CHLORIDE FLUSH 0.9 % IV SOLN
5.0000 mL | Freq: Every day | INTRAVENOUS | 1 refills | Status: DC
  Filled 2024-05-02: qty 150, 30d supply, fill #0

## 2024-05-02 MED ORDER — SODIUM CHLORIDE FLUSH 0.9 % IV SOLN: 5.0000 mL | Freq: Every day | INTRAVENOUS | 1 refills | Status: AC

## 2024-05-02 NOTE — Progress Notes (Signed)
 Occupational Therapy Treatment Patient Details Name: Jeffery Sweeney MRN: [id number] DOB: [birth year] Today's Date: 05/02/2024   History of present illness 63 year old man with HTN, CAD, CVA s/p PCI, ongoing tobacco use presented with acute onset of epigastric and RUQ abdominal pain without associated nausea vomiting or diarrhea.  RUQ US  revealed Cholelithiasis without sonographic evidence of acute cholecystitis and borderline dilated CBD at 8 mm unchanged from prior CT. S/P chole drain placement 12/12.   OT comments  Pt received in recliner. Session focused on ADL edu and mobility with WIS transfer. Pt able to perform multiple STS with / without RW at supervision level, t/f bathroom with RW and up to CGA for safety during walk in shower transfer, simulated stepping over threshold with HHA. Edu on LB AE for increased independence for dressing with visual demonstration, pt verbalizes understanding. Pt will continue to benefit from [facility] OT at hospital discharge.       If plan is discharge home, recommend the following:  A little help with walking and/or transfers;A little help with bathing/dressing/bathroom   Equipment Recommendations  Tub/shower seat       Precautions / Restrictions Precautions Precautions: Fall Restrictions Weight Bearing Restrictions Per Provider Order: No       Mobility Bed Mobility Overal bed mobility: Needs Assistance             General bed mobility comments: NT patient up in recliner    Transfers Overall transfer level: Needs assistance   Transfers: Sit to/from Stand Sit to Stand: Supervision                 Balance Overall balance assessment: Modified Independent Sitting-balance support: Feet supported Sitting balance-Leahy Scale: Normal     Standing balance support: Bilateral upper extremity supported, During functional activity, Reliant on assistive device for balance Standing balance-Leahy Scale: Good                              ADL either performed or assessed with clinical judgement   ADL Overall ADL's : Needs assistance/impaired                     Lower Body Dressing: With adaptive equipment Lower Body Dressing Details (indicate cue type and reason): edu on use of LB AE for ind in LB dressing. discussed use of reacher and sock aide for LB as pt states he has difficulties with dressing baseline         Tub/ Shower Transfer: Walk-in shower;Cueing for sequencing;Cueing for safety;Shower seat;Ambulation;Rolling walker (2 wheels);Contact guard assist Tub/Shower Transfer Details (indicate cue type and reason): CGA with RW t/f bathroom to practice WIS transfer. Pt able to practice stepping over threshold and sit on seat with CGA. Cues for overall sequencing and safety. Functional mobility during ADLs: Contact guard assist;Supervision/safety;Rolling walker (2 wheels) General ADL Comments: t/f bathroom using RW, supervision, CGA for saftey during shower transfer     Communication Communication Communication: No apparent difficulties   Cognition Arousal: Alert Behavior During Therapy: WFL for tasks assessed/performed Cognition: No apparent impairments                               Following commands: Intact        Cueing   Cueing Techniques: Verbal cues        General Comments VSS on RA    Pertinent Vitals/ Pain  Pain Assessment Pain Assessment: No/denies pain Pain Score: 0-No pain   Frequency  Min 2X/week        Progress Toward Goals  OT Goals(current goals can now be found in the care plan section)  Progress towards OT goals: Progressing toward goals  Acute Rehab OT Goals OT Goal Formulation: With patient Time For Goal Achievement: 05/13/24 Potential to Achieve Goals: Fair ADL Goals Pt Will Perform Grooming: with modified independence;standing Pt Will Perform Lower Body Dressing: with modified independence;sit to/from stand Pt Will Transfer to  Toilet: with modified independence;ambulating Pt Will Perform Toileting - Clothing Manipulation and hygiene: with modified independence;sit to/from stand  Plan         AM-PAC OT 6 Clicks Daily Activity     Outcome Measure   Help from another person eating meals?: None Help from another person taking care of personal grooming?: A Little Help from another person toileting, which includes using toliet, bedpan, or urinal?: A Little Help from another person bathing (including washing, rinsing, drying)?: A Little Help from another person to put on and taking off regular upper body clothing?: None Help from another person to put on and taking off regular lower body clothing?: A Little 6 Click Score: 20    End of Session Equipment Utilized During Treatment: Rolling walker (2 wheels)  OT Visit Diagnosis: Unsteadiness on feet (R26.81);Muscle weakness (generalized) (M62.81)   Activity Tolerance Patient tolerated treatment well   Patient Left in chair;with call bell/phone within reach;with chair alarm set   Nurse Communication Mobility status        Time: 9066-9049 OT Time Calculation (min): 17 min  Charges: OT General Charges $OT Visit: 1 Visit OT Treatments $Self Care/Home Management : 8-22 mins  Laysha Childers L. Tyeson Tanimoto, OTR/L  05/02/2024, 12:06 PM

## 2024-05-02 NOTE — Progress Notes (Signed)
 [facility] SURGICAL PROGRESS NOTE (cpt [phone])  Hospital Day(s): 3.   Interval History: Patient seen and examined, no acute events or new complaints overnight. Patient reports he is feeling better. Soreness primarily at drain site itself. No fever, chills, nausea, emesis. No new labs. Percutaneous cholecystostomy tube with 100 ccs out recorded; bilious. Cx from this with klebsiella. He is on Zosyn . Diet advanced. Decreased appetite.   Review of Systems:  Constitutional: denies fever, chills  HEENT: denies cough or congestion  Respiratory: denies any shortness of breath  Cardiovascular: denies chest pain or palpitations  Gastrointestinal: + abdominal pain (at drain only); denied N/V Genitourinary: denies burning with urination or urinary frequency Musculoskeletal: denies pain, decreased motor or sensation  Vital signs in last 24 hours: [min-max] current  Temp:  [98.7 F (37.1 C)-99.2 F (37.3 C)] 99.2 F (37.3 C) (12/15 0438) Pulse Rate:  [97-109] 98 (12/15 0438) Resp:  [16-20] 17 (12/15 0438) BP: (130-170)/(71-97) 153/83 (12/15 0438) SpO2:  [91 %-94 %] 91 % (12/15 0438)     Height: 5' 3.5 (161.3 cm) Weight: 86.6 kg BMI (Calculated): 33.29   Intake/Output last 2 shifts:  12/14 0701 - 12/15 0700 In: -  Out: 500 [Urine:400; Drains:100]   Physical Exam:  Constitutional: alert, cooperative and no distress  HENT: normocephalic without obvious abnormality  Eyes: PERRL, EOM's grossly intact and symmetric  Respiratory: breathing non-labored at rest  Cardiovascular: regular rate and sinus rhythm  Gastrointestinal: soft, he continues to be tender at drain site only, and non-distended   Labs:     Latest Ref Rng & Units 05/01/2024    4:49 AM 04/30/2024   10:03 AM 04/29/2024    5:22 AM  CBC  WBC 4.0 - 10.5 K/uL 8.7  8.6  13.7   Hemoglobin 13.0 - 17.0 g/dL 88.1  87.8  86.7   Hematocrit 39.0 - 52.0 % 36.3  37.0  39.4   Platelets 150 - 400 K/uL 197  174  193        Latest Ref Rng & Units 04/30/2024   10:03 AM 04/29/2024    5:22 AM 04/28/2024    6:09 AM  CMP  Glucose 70 - 99 mg/dL 840  870  843   BUN 8 - 23 mg/dL 21  14  12    Creatinine 0.61 - 1.24 mg/dL 9.06  8.95  9.03   Sodium 135 - 145 mmol/L 137  137  138   Potassium 3.5 - 5.1 mmol/L 3.7  4.1  4.4   Chloride 98 - 111 mmol/L 103  103  102   CO2 22 - 32 mmol/L 24  25  24    Calcium  8.9 - 10.3 mg/dL 8.8  8.9  9.2   Total Protein 6.5 - 8.1 g/dL  6.6  7.0   Total Bilirubin 0.0 - 1.2 mg/dL  2.7  1.1   Alkaline Phos 38 - 126 U/L  46  55   AST 15 - 41 U/L  16  23   ALT 0 - 44 U/L  10  18      Imaging studies: No new pertinent imaging studies   Assessment/Plan: (ICD-10's: K81.0) 63 y.o. male with acute cholecystitis s/p percutaneous cholecystostomy, complicated by pertinent comorbidities including history of CVA, hypertension, coronary artery disease status post PCI and stent placement chronic antiplatelet therapy, smoker.                - Okay to continue diet as tolerated  - Continue percutaneous cholecystostomy tube;  monitor and record output. He will need to flush this with 5 ccs NS at home daily - will leave paper script on chart              - Continue IV Abx (Zosyn ); Cx reviewed - Augmentin  should be sufficient for home. Recommend 14 days total (IV+PO)             - Monitor abdominal examination             - Pain control prn; antiemetics prn - Further management per primary service   - Discharge Planning: He is okay from discharge from surgical perspective. He will go with cholecystostomy for 6-8 weeks. We will follow up in ~3 weeks.    All of the above findings and recommendations were discussed with the patient, and the medical team, and all of patient's questions were answered to his expressed satisfaction.  -- Arthea Platt, PA-C [facility] 05/02/2024, 8:15 AM M-F: 7am - 4pm

## 2024-05-02 NOTE — Progress Notes (Signed)
 Physical Therapy Treatment Patient Details Name: Jeffery Sweeney MRN: [id number] DOB: April 09, 1961 Today's Date: 05/02/2024   History of Present Illness 63 year old man with HTN, CAD, CVA s/p PCI, ongoing tobacco use presented with acute onset of epigastric and RUQ abdominal pain without associated nausea vomiting or diarrhea.  RUQ US  revealed Cholelithiasis without sonographic evidence of acute cholecystitis and borderline dilated CBD at 8 mm unchanged from prior CT. S/P chole drain placement 12/12.    PT Comments  Patient received up in recliner, requesting to ambulate. Patient is on room air on arrival. He stands with no assistance from recliner. Ambulated  175 feet with RW and cga. No lob, some mild pain reported during mobility. He reports mild fatigue after ambulation, no sob. Patient will continue to benefit from skilled PT to improve endurance and strength.     If plan is discharge home, recommend the following: A little help with walking and/or transfers;Help with stairs or ramp for entrance   Can travel by private vehicle     Yes  Equipment Recommendations  Rolling walker (2 wheels)    Recommendations for Other Services       Precautions / Restrictions Precautions Precautions: None Precaution/Restrictions Comments: mod fall risk Restrictions Weight Bearing Restrictions Per Provider Order: No     Mobility  Bed Mobility               General bed mobility comments: NT patient up in recliner    Transfers Overall transfer level: Modified independent Equipment used: Rolling walker (2 wheels) Transfers: Sit to/from Stand Sit to Stand: Modified independent (Device/Increase time)                Ambulation/Gait Ambulation/Gait assistance: Contact guard assist Gait Distance (Feet): 175 Feet Assistive device: Rolling walker (2 wheels) Gait Pattern/deviations: Step-through pattern Gait velocity: WFL     General Gait Details: no overt difficulties during  ambulation, safe with RW, fatigued after walking 175 feet. Denies sob   Comptroller Bed    Modified Rankin (Stroke Patients Only)       Balance Overall balance assessment: Modified Independent Sitting-balance support: Feet supported Sitting balance-Leahy Scale: Normal     Standing balance support: Bilateral upper extremity supported, During functional activity, Reliant on assistive device for balance Standing balance-Leahy Scale: Good                              Communication Communication Communication: No apparent difficulties  Cognition Arousal: Alert Behavior During Therapy: WFL for tasks assessed/performed   PT - Cognitive impairments: No apparent impairments                         Following commands: Intact      Cueing Cueing Techniques: Verbal cues  Exercises      General Comments        Pertinent Vitals/Pain Pain Assessment Pain Assessment: Faces Faces Pain Scale: Hurts a little bit Pain Location: abdomen Pain Descriptors / Indicators: Discomfort, Sore Pain Intervention(s): Monitored during session    Home Living                          Prior Function            PT Goals (current goals can now be found in  the care plan section) Acute Rehab PT Goals Patient Stated Goal: get better return home PT Goal Formulation: With patient Time For Goal Achievement: 05/14/24 Potential to Achieve Goals: Good Progress towards PT goals: Progressing toward goals    Frequency    Min 2X/week      PT Plan      Co-evaluation              AM-PAC PT 6 Clicks Mobility   Outcome Measure  Help needed turning from your back to your side while in a flat bed without using bedrails?: A Little Help needed moving from lying on your back to sitting on the side of a flat bed without using bedrails?: A Little Help needed moving to and from a bed to a chair (including a  wheelchair)?: A Little Help needed standing up from a chair using your arms (e.g., wheelchair or bedside chair)?: A Little Help needed to walk in hospital room?: A Little Help needed climbing 3-5 steps with a railing? : A Little 6 Click Score: 18    End of Session   Activity Tolerance: Patient tolerated treatment well Patient left: in chair;with call bell/phone within reach;Other (comment) (OT present in room.) Nurse Communication: Mobility status PT Visit Diagnosis: Muscle weakness (generalized) (M62.81)     Time: 9073-9065 PT Time Calculation (min) (ACUTE ONLY): 8 min  Charges:    $Gait Training: 8-22 mins PT General Charges $$ ACUTE PT VISIT: 1 Visit                     Duvan Mousel, PT, GCS 05/02/2024,10:23 AM

## 2024-05-02 NOTE — Progress Notes (Signed)
 PROGRESS NOTE    Jeffery Sweeney  FMW:[id number] DOB: 1960/11/17 DOA: 04/27/2024 PCP: Towana Small, FNP    Assessment & Plan:   Principal Problem:   Biliary colic Active Problems:   Dyslipidemia   Coronary artery disease   Controlled type 2 diabetes mellitus without complication, without long-term current use of insulin  (HCC)   Essential hypertension  Assessment and Plan: Acute cholecystitis: s/p percutaneous cholecystectomy drain placement. Continue on IV zosyn . Gen surg following and recs apprec    HTN: continue on home dose imdur , metoprolol     DM2: well controlled, HbA1c 6.6. Continue on SSI w/ accuchecks   Hx of CAD: continue on imdur , metoprolol , aspirin , plavix , statin   HLD: continue on statin   Obesity: BMI 33.2. Complicates overall care & prognosis     DVT prophylaxis: lovenox   Code Status: full  Family Communication:  Disposition Plan: likely d/c back home vs SNF   Status is: Inpatient Remains inpatient appropriate because: severity of illness     Level of care: Telemetry Consultants:  IR Gen surg   Procedures:   Antimicrobials: zosyn    Subjective: Pt c/o fatigue   Objective: Vitals:   05/01/24 2003 05/01/24 2117 05/02/24 0438 05/02/24 0857  BP: (!) 170/97  (!) 153/83 (!) 164/91  Pulse: (!) 109  98 99  Resp: 16  17 20   Temp: 98.7 F (37.1 C)  99.2 F (37.3 C) 98.1 F (36.7 C)  TempSrc:    Oral  SpO2: 94% 93% 91% 92%  Weight:      Height:        Intake/Output Summary (Last 24 hours) at 05/02/2024 1031 Last data filed at 05/02/2024 0900 Gross per 24 hour  Intake 480 ml  Output 975 ml  Net -495 ml   Filed Weights   04/27/24 2316 04/29/24 2240 04/30/24 0613  Weight: 91 kg 83.8 kg 86.6 kg    Examination:  General exam: appears comfortable  Respiratory system: diminished breath sounds b/l  Cardiovascular system: S1/S2+. No rubs or clicks  Gastrointestinal system: abd is soft, NT, obese & hypoactive bowel sounds   Central nervous system: alert & oriented. Moves all extremities  Psychiatry: judgement and insight appears at baseline. Flat mood and affect    Data Reviewed: I have personally reviewed following labs and imaging studies  CBC: Recent Labs  Lab 04/27/24 2052 04/28/24 0609 04/29/24 0522 04/30/24 1003 05/01/24 0449  WBC 9.1 14.1* 13.7* 8.6 8.7  HGB 15.4 14.6 13.2 12.1* 11.8*  HCT 44.6 43.1 39.4 37.0* 36.3*  MCV 87.1 88.9 89.1 90.0 91.7  PLT 278 224 193 174 197   Basic Metabolic Panel: Recent Labs  Lab 04/27/24 2052 04/28/24 0609 04/29/24 0522 04/30/24 1003  NA 139 138 137 137  K 4.1 4.4 4.1 3.7  CL 101 102 103 103  CO2 25 24 25 24   GLUCOSE 145* 156* 129* 159*  BUN 10 12 14 21   CREATININE 1.08 0.96 1.04 0.93  CALCIUM  9.3 9.2 8.9 8.8*   GFR: Estimated Creatinine Clearance: 78.9 mL/min (by C-G formula based on SCr of 0.93 mg/dL). Liver Function Tests: Recent Labs  Lab 04/27/24 2052 04/28/24 0609 04/29/24 0522  AST 23 23 16   ALT 20 18 10   ALKPHOS 61 55 46  BILITOT 1.0 1.1 2.7*  PROT 7.6 7.0 6.6  ALBUMIN  4.3 4.0 3.5   Recent Labs  Lab 04/27/24 2052  LIPASE 24   No results for input(s): AMMONIA in the last 168 hours. Coagulation Profile: Recent Labs  Lab 04/28/24 1656  INR 1.1   Cardiac Enzymes: No results for input(s): CKTOTAL, CKMB, CKMBINDEX, TROPONINI in the last 168 hours. BNP (last 3 results) No results for input(s): PROBNP in the last 8760 hours. HbA1C: Recent Labs    04/30/24 0442  HGBA1C 6.6*   CBG: Recent Labs  Lab 05/01/24 0739 05/01/24 1115 05/01/24 1610 05/01/24 2254 05/02/24 0855  GLUCAP 103* 157* 121* 125* 96   Lipid Profile: No results for input(s): CHOL, HDL, LDLCALC, TRIG, CHOLHDL, LDLDIRECT in the last 72 hours. Thyroid  Function Tests: No results for input(s): TSH, T4TOTAL, FREET4, T3FREE, THYROIDAB in the last 72 hours. Anemia Panel: No results for input(s): VITAMINB12, FOLATE,  FERRITIN, TIBC, IRON, RETICCTPCT in the last 72 hours. Sepsis Labs: No results for input(s): PROCALCITON, LATICACIDVEN in the last 168 hours.  Recent Results (from the past 240 hours)  Aerobic/Anaerobic Culture w Gram Stain (surgical/deep wound)     Status: None (Preliminary result)   Collection Time: 04/29/24  1:28 PM   Specimen: BILE  Result Value Ref Range Status   Specimen Description   Final    BILE Performed at [facility], [address], [place], [place] [address]    Special Requests   Final    NONE Performed at [facility], [address] [place], [place] [address]    Gram Stain   Final    ABUNDANT WBC PRESENT, PREDOMINANTLY PMN ABUNDANT GRAM NEGATIVE RODS Performed at [facility], [address], [place], [place] [address]    Culture   Final    ABUNDANT KLEBSIELLA PNEUMONIAE NO ANAEROBES ISOLATED; CULTURE IN PROGRESS FOR 5 DAYS    Report Status PENDING  Incomplete   Organism ID, Bacteria KLEBSIELLA PNEUMONIAE  Final      Susceptibility   Klebsiella pneumoniae - MIC*    AMPICILLIN RESISTANT Resistant     CEFAZOLIN (NON-URINE) 2 SENSITIVE Sensitive     CEFEPIME  <=0.12 SENSITIVE Sensitive     ERTAPENEM <=0.12 SENSITIVE Sensitive     CEFTRIAXONE  <=0.25 SENSITIVE Sensitive     CIPROFLOXACIN  <=0.06 SENSITIVE Sensitive     GENTAMICIN <=1 SENSITIVE Sensitive     MEROPENEM <=0.25 SENSITIVE Sensitive     TRIMETH/SULFA <=20 SENSITIVE Sensitive     AMPICILLIN/SULBACTAM <=2 SENSITIVE Sensitive     PIP/TAZO Value in next row Sensitive      <=4 SENSITIVEThis is a modified FDA-approved test that has been validated and its performance characteristics determined by the reporting laboratory.  This laboratory is certified under the Clinical Laboratory Improvement Amendments CLIA as qualified to perform high complexity clinical laboratory testing.    * ABUNDANT KLEBSIELLA PNEUMONIAE         Radiology Studies: No results  found.       Scheduled Meds:  aspirin   81 mg Oral Daily   clopidogrel   75 mg Oral QPM   enoxaparin  (LOVENOX ) injection  40 mg Subcutaneous Q24H   insulin  aspart  0-15 Units Subcutaneous TID WC   insulin  aspart  0-5 Units Subcutaneous QHS   isosorbide  mononitrate  60 mg Oral BID   metoprolol  tartrate  50 mg Oral BID   pantoprazole   40 mg Oral Daily   rosuvastatin   40 mg Oral Daily   sodium chloride  flush  5 mL Intracatheter Q8H   Continuous Infusions:  piperacillin -tazobactam (ZOSYN )  IV 3.375 g (05/02/24 0910)     LOS: 3 days       Anthony CHRISTELLA Pouch, MD [facility] Pager [phone]  If 7PM-7AM, please contact night-coverage [email or web address] 05/02/2024, 10:31 AM

## 2024-05-03 ENCOUNTER — Other Ambulatory Visit: Payer: Self-pay

## 2024-05-03 LAB — GLUCOSE, CAPILLARY
Glucose-Capillary: 129 mg/dL — ABNORMAL HIGH (ref 70–99)
Glucose-Capillary: 107 mg/dL — ABNORMAL HIGH (ref 70–99)

## 2024-05-03 MED ORDER — AMOXICILLIN-POT CLAVULANATE 875-125 MG PO TABS
1.0000 | ORAL_TABLET | Freq: Two times a day (BID) | ORAL | 0 refills | Status: AC
  Filled 2024-05-03: qty 20, 10d supply, fill #0

## 2024-05-03 MED ORDER — OXYCODONE-ACETAMINOPHEN 5-325 MG PO TABS
1.0000 | ORAL_TABLET | Freq: Four times a day (QID) | ORAL | 0 refills | Status: AC | PRN
  Filled 2024-05-03: qty 20, 5d supply, fill #0

## 2024-05-03 NOTE — Progress Notes (Signed)
 Occupational Therapy Treatment Patient Details Name: Jeffery Sweeney MRN: [id number] DOB: September 13, 1960 Today's Date: 05/03/2024   History of present illness 63 year old man with HTN, CAD, CVA s/p PCI, ongoing tobacco use presented with acute onset of epigastric and RUQ abdominal pain without associated nausea vomiting or diarrhea.  RUQ US  revealed Cholelithiasis without sonographic evidence of acute cholecystitis and borderline dilated CBD at 8 mm unchanged from prior CT. S/P chole drain placement 12/12.   OT comments  Pt making good progress towards goals. Pt able to complete transfers, mobility & UB/LB ADLs with CGA fading to supervision. Does benefit from intermittent verbal cues for safety to manage IV line and R drain. Pt with decreased standing tolerance and fatigues quickly, often stopping ADL performance for seated rest break ~30 seconds with cues to recover. Pt hopeful to discharge home soon, OT to follow acutely.       If plan is discharge home, recommend the following:  A little help with walking and/or transfers;A little help with bathing/dressing/bathroom   Equipment Recommendations  Tub/shower seat       Precautions / Restrictions Precautions Precautions: Fall Recall of Precautions/Restrictions: Impaired Precaution/Restrictions Comments: decreased awareness of R drain Restrictions Weight Bearing Restrictions Per Provider Order: No       Mobility Bed Mobility Overal bed mobility: Needs Assistance Bed Mobility: Supine to Sit, Sit to Supine, Sidelying to Sit, Sit to Sidelying   Sidelying to sit: Supervision Supine to sit: Supervision Sit to supine: Supervision Sit to sidelying: Supervision General bed mobility comments: log roll technique (self selected by pt) without physical assist    Transfers Overall transfer level: Needs assistance Equipment used: Rolling walker (2 wheels), None Transfers: Sit to/from Stand Sit to Stand: Supervision                  Balance Overall balance assessment: Needs assistance Sitting-balance support: Feet supported Sitting balance-Leahy Scale: Normal     Standing balance support: During functional activity, Reliant on assistive device for balance, No upper extremity supported Standing balance-Leahy Scale: Good Standing balance comment: no UE support for donning pants over hips                           ADL either performed or assessed with clinical judgement   ADL Overall ADL's : Needs assistance/impaired     Grooming: Oral care;Standing;Supervision/safety Grooming Details (indicate cue type and reason): sink level Upper Body Bathing: Sitting;Supervision/ safety;Set up Upper Body Bathing Details (indicate cue type and reason): setup for UB bathing seated in chair Lower Body Bathing: Sit to/from stand;Supervison/ safety Lower Body Bathing Details (indicate cue type and reason): STS from chair in bathroom, no direct physical assist Upper Body Dressing : Sitting;Minimal assistance Upper Body Dressing Details (indicate cue type and reason): minA for tying gown Lower Body Dressing: Supervision/safety;Minimal assistance;Sit to/from stand Lower Body Dressing Details (indicate cue type and reason): dons socks and underwear, minA to start threading socks, pt able to don underwear and pull over hips from STS             Functional mobility during ADLs: Supervision/safety;Rolling walker (2 wheels);Cueing for safety General ADL Comments: t/f bathroom for UB/LB sponge bathing, supervision with occ vcs for safety (mostly for drain / IV line awareness).     Communication Communication Communication: No apparent difficulties   Cognition Arousal: Alert Behavior During Therapy: [facility] for tasks assessed/performed  OT - Cognition Comments: decreased safety awareness, requires cues for safety and IV pole / drain mgmt                 Following commands: Intact         Cueing   Cueing Techniques: Verbal cues        General Comments VSS on RA    Pertinent Vitals/ Pain       Pain Assessment Pain Assessment: Faces Faces Pain Scale: Hurts little more Pain Location: abdomen Pain Descriptors / Indicators: Discomfort, Sore Pain Intervention(s): Limited activity within patient's tolerance, Monitored during session   Frequency  Min 2X/week        Progress Toward Goals  OT Goals(current goals can now be found in the care plan section)  Progress towards OT goals: Progressing toward goals  Acute Rehab OT Goals OT Goal Formulation: With patient Time For Goal Achievement: 05/13/24 Potential to Achieve Goals: Fair ADL Goals Pt Will Perform Grooming: with modified independence;standing Pt Will Perform Lower Body Dressing: with modified independence;sit to/from stand Pt Will Transfer to Toilet: with modified independence;ambulating Pt Will Perform Toileting - Clothing Manipulation and hygiene: with modified independence;sit to/from stand  Plan         AM-PAC OT 6 Clicks Daily Activity     Outcome Measure   Help from another person eating meals?: None Help from another person taking care of personal grooming?: A Little Help from another person toileting, which includes using toliet, bedpan, or urinal?: A Little Help from another person bathing (including washing, rinsing, drying)?: A Little Help from another person to put on and taking off regular upper body clothing?: None Help from another person to put on and taking off regular lower body clothing?: A Little 6 Click Score: 20    End of Session Equipment Utilized During Treatment: Rolling walker (2 wheels)  OT Visit Diagnosis: Unsteadiness on feet (R26.81);Muscle weakness (generalized) (M62.81)   Activity Tolerance Patient tolerated treatment well   Patient Left with call bell/phone within reach;in bed;with bed alarm set;Other (comment) (with case mgmt)   Nurse Communication Mobility  status        Time: 8941-8873 OT Time Calculation (min): 28 min  Charges: OT General Charges $OT Visit: 1 Visit OT Treatments $Self Care/Home Management : 23-37 mins  Jeffery Sweeney, OTR/L  05/03/2024, 2:22 PM

## 2024-05-03 NOTE — Progress Notes (Signed)
° °  Brief Progress Note   _____________________________________________________________________________________________________________  Patient Name: Jeffery Sweeney Patient DOB: June 26, 1960 Date: @TODAY @      Data: Chart reviewed, noted on 12/15 PT/OT recommended shower seat and rolling walker. No current orders noted.       Action: Epic secure chat with ICM and MD .     Cosmo:  MD to place orders and ICM will order DME .  _____________________________________________________________________________________________________________  [facility] RN Expeditor Ikey Omary L Briyanna Billingham Please contact us  directly via secure chat (search for [facility]) or by calling us  at [phone] [facility]).

## 2024-05-03 NOTE — Progress Notes (Signed)
 Referring Physician(s): Arthea Platt PA  Supervising Physician: Luverne Aran  Patient Status:  [facility] - In-pt  Chief Complaint:  Acute cholecystitis /sp cholecystomy tube placement on 12.12.125  Subjective:  Patient laying in bed. Abdominal pain improving. States that he is expecting to be discharged tomorrow. All questions and concerns regarding the drain were addressed. Discharge instructions communicated. Spoke to RN who will reinforce education.   Allergies: Patient has no known allergies.  Medications: Prior to Admission medications  Medication Sig Start Date End Date Taking? Authorizing Provider  acetaminophen  (TYLENOL ) 500 MG tablet Take 2 tablets (1,000 mg total) by mouth every 6 (six) hours as needed. 10/15/23 10/14/24 Yes Clarine Ozell LABOR, MD  albuterol  (VENTOLIN  HFA) 108 (90 Base) MCG/ACT inhaler Inhale 2 puffs into the lungs every 6 (six) hours as needed for wheezing or shortness of breath. 06/16/23  Yes Towana Small, FNP  aspirin  81 MG chewable tablet Chew 1 tablet (81 mg total) by mouth daily. 02/23/24  Yes Towana Small, FNP  clopidogrel  (PLAVIX ) 75 MG tablet Take 1 tablet (75 mg total) by mouth daily. 02/15/24  Yes Darron Deatrice LABOR, MD  isosorbide  mononitrate (IMDUR ) 30 MG 24 hr tablet Take 2 tablets (60 mg total) by mouth 2 (two) times daily. 02/23/24 05/23/24 Yes Towana Small, FNP  meclizine  (ANTIVERT ) 25 MG tablet Take 2 tablets (50 mg total) by mouth 2 (two) times daily as needed for dizziness. 02/23/24  Yes Towana Small, FNP  metFORMIN  (GLUCOPHAGE -XR) 500 MG 24 hr tablet Take 2 tablets (1,000 mg total) by mouth 2 (two) times daily with a meal. 10/20/23  Yes Towana Small, FNP  metoprolol  tartrate (LOPRESSOR ) 50 MG tablet Take 1 tablet (50 mg total) by mouth 2 (two) times daily. 02/23/24  Yes Towana Small, FNP  nitroGLYCERIN  (NITROSTAT ) 0.4 MG SL tablet Place 1 tablet (0.4 mg total) under the tongue every 5 (five) minutes as needed for chest pain.  09/26/21  Yes Hammock, Sheri, NP  pantoprazole  (PROTONIX ) 20 MG tablet Take 2 tablets (40 mg total) by mouth daily. 02/23/24  Yes Towana Small, FNP  rosuvastatin  (CRESTOR ) 40 MG tablet Take 1 tablet by mouth once daily 02/23/24  Yes Towana Small, FNP  sodium chloride  flush 0.9 % SOLN injection 5 mLs by Intracatheter route daily. Flush cholecystostomy tube daily with 5 ccs NS 05/02/24 07/01/24 Yes Schulz, Zachary R, PA-C  traZODone  (DESYREL ) 50 MG tablet Take 0.5-1 tablets (25-50 mg total) by mouth at bedtime as needed for sleep. 02/23/24  Yes Towana Small, FNP  sodium chloride  flush 0.9 % SOLN injection 5 mLs by Intracatheter route daily. 05/02/24 07/01/24  Desiderio Schanz, MD     Vital Signs: BP (!) 142/84 (BP Location: Right Arm)   Pulse 88   Temp 97.9 F (36.6 C)   Resp 16   Ht 5' 3.5 (1.613 m)   Wt 190 lb 14.7 oz (86.6 kg)   SpO2 92%   BMI 33.29 kg/m   Physical Exam  Imaging: IR Perc Cholecystostomy Result Date: 04/29/2024 INDICATION: Acute cholecystitis. EXAM: ULTRASOUND AND FLUOROSCOPIC-GUIDED CHOLECYSTOSTOMY TUBE PLACEMENT COMPARISON:  NM HIDA, 04/28/2024.  US  Abdomen, 04/27/2024. MEDICATIONS: The patient is currently admitted to the hospital and on intravenous antibiotics. Antibiotics were administered within an appropriate time frame prior to skin puncture. 0.5 mg Dilaudid  IV and 25 mg Demerol  IV ANESTHESIA/SEDATION: Moderate (conscious) sedation was employed during this procedure. A total of Versed  1 mg and Fentanyl  100 mcg was administered intravenously. Moderate Sedation Time: 21 minutes. The patient's level  of consciousness and vital signs were monitored continuously by radiology nursing throughout the procedure under my direct supervision. CONTRAST:  10mL OMNIPAQUE  IOHEXOL  300 MG/ML SOLN - administered into the gallbladder fossa. FLUOROSCOPY TIME:  Fluoroscopic dose; 12.6 mGy COMPLICATIONS: None immediate. PROCEDURE: Informed written consent was obtained from the patient  after a discussion of the risks, benefits and alternatives to treatment. Questions regarding the procedure were encouraged and answered. A timeout was performed prior to the initiation of the procedure. The right upper abdominal quadrant was prepped and draped in the usual sterile fashion, and a sterile drape was applied covering the operative field. Maximum barrier sterile technique with sterile gowns and gloves were used for the procedure. A timeout was performed prior to the initiation of the procedure. Local anesthesia was provided with 1% lidocaine  with epinephrine . Ultrasound scanning of the RIGHT upper quadrant demonstrates a distended gallbladder. Of note, the patient reported pain with ultrasound imaging over the gallbladder. Utilizing a transhepatic approach, a 22 gauge needle was advanced into the gallbladder under direct ultrasound guidance. An ultrasound image was saved for documentation purposes. Appropriate intraluminal puncture was confirmed with the efflux of bile and advancement of an 0.018 wire into the gallbladder lumen. The needle was exchanged for an Accustick set. A small amount of contrast was injected to confirm appropriate intraluminal positioning. Over a Benson wire, a 10 Fr cholecystomy tube was advanced into the gallbladder fossa, coiled and locked. Bile was aspirated and a small amount of contrast was injected as several post procedural spot radiographic images were obtained in various obliquities. The catheter was secured to the skin with suture, connected to a drainage bag and a dressing was placed. The patient tolerated the procedure well without immediate post procedural complication. IMPRESSION: Successful ultrasound and fluoroscopic guided placement of a 10 Fr cholecystostomy tube. The patient developed rigors acutely s/p drain placement. This was managed with medications, and his primary team was notified. RECOMMENDATIONS: The patient will return to Vascular Interventional  Radiology (VIR) for routine drainage catheter evaluation and exchange in 6-8 weeks. Thom Hall, MD [facility] Electronically Signed   By: Thom Hall M.D.   On: 04/29/2024 14:56    Labs:  CBC: Recent Labs    04/28/24 0609 04/29/24 0522 04/30/24 1003 05/01/24 0449  WBC 14.1* 13.7* 8.6 8.7  HGB 14.6 13.2 12.1* 11.8*  HCT 43.1 39.4 37.0* 36.3*  PLT 224 193 174 197    COAGS: Recent Labs    04/28/24 1656  INR 1.1    BMP: Recent Labs    04/27/24 2052 04/28/24 0609 04/29/24 0522 04/30/24 1003  NA 139 138 137 137  K 4.1 4.4 4.1 3.7  CL 101 102 103 103  CO2 25 24 25 24   GLUCOSE 145* 156* 129* 159*  BUN 10 12 14 21   CALCIUM  9.3 9.2 8.9 8.8*  CREATININE 1.08 0.96 1.04 0.93  GFRNONAA >60 >60 >60 >60    LIVER FUNCTION TESTS: Recent Labs    07/10/23 1141 04/27/24 2052 04/28/24 0609 04/29/24 0522  BILITOT 0.9 1.0 1.1 2.7*  AST 17 23 23 16   ALT 15 20 18 10   ALKPHOS 76 61 55 46  PROT 7.0 7.6 7.0 6.6  ALBUMIN  4.3 4.3 4.0 3.5    Assessment and Plan:  64 y.o. male inpatient. Smoker. History of HTN, CAD, CVA. Presented to the ED at [facility] with RUQ pain Found to have acute cholecystitis s/p cholecystomy tube placed on 12.12.25  Drain Location: RUQ  Size: Fr size: 10 Fr Date of placement: 12.12.25  Currently to: Drain collection device: gravity 24 hour output:  Output by Drain (mL) 05/01/24 0700 - 05/01/24 1459 05/01/24 1500 - 05/01/24 2259 05/01/24 2300 - 05/02/24 0659 05/02/24 0700 - 05/02/24 1459 05/02/24 1500 - 05/02/24 2259 05/02/24 2300 - 05/03/24 0659 05/03/24 0700 - 05/03/24 1152  Closed System Drain 1 RUQ 10 Fr. 100   175 100 100   50 ml of watery brown fluid in gravity bag. Cultures grew klebsiella pneumoniae  Interval imaging/drain manipulation:  None since drain placement  Current examination: Flushes/aspirates easily.  Insertion site unremarkable. Suture and stat lock in place. Dressed  appropriately.   Plan: Continue TID flushes with 5 cc NS. Record output Q shift. Dressing changes QD or PRN if soiled.  Call IR APP or on call IR MD if difficulty flushing or sudden change in drain output.   Discharge planning: Outpatient orders are in place.  Typically patient will follow up 6-8 week for drain exchange. Cholecystomy tube drain will need to remain in place at least 6 -8  weeks unless gallbladder surgically removed in interim; Once daily with 5 ml sterile normal saline as outpatient; will schedule f/u cholangiogram/ drain/ exchange in 6-8 weeks. IR scheduler will contact patient with date/time of appointment. Patient will need to flush drain QD with 5 cc NS, record output QD, dressing changes every 2-3 days or earlier if soiled.   IR will continue to follow - please call with questions or concerns.   Electronically Signed: Delon JAYSON Beagle, NP 05/03/2024, 11:53 AM   I spent a total of 15 Minutes at the patient's bedside AND on the patient's hospital floor or unit, greater than 50% of which was counseling/coordinating care for cholecystomy tube placement

## 2024-05-03 NOTE — Discharge Summary (Addendum)
 Physician Discharge Summary  Jeffery Sweeney FMW:[id number] DOB: March 10, 1961 DOA: 04/27/2024  PCP: Jeffery Small, FNP  Admit date: 04/27/2024 Discharge date: 05/03/2024  Admitted From: home Disposition:  home w/ home health   Recommendations for Outpatient Follow-up:  Follow up with PCP in 1-2 weeks F/u w/ gen surg, Dr. Rodolph, in 3 weeks F/u w/ IR in 6 weeks  Home Health: yes Equipment/Devices: percutaneous cholecystectomy drain   Discharge Condition: stable  CODE STATUS: full  Diet recommendation: Carb Modified  Brief/Interim Summary: HPI was taken from Jeffery Sweeney: Jeffery Sweeney is a 63 y.o. Caucasian male with medical history significant for dyslipidemia, CVA, hypertension, coronary artery disease, status post PCI and stent, and tobacco abuse, who presented to the ER with acute onset of epigastric and right upper quadrant abdominal pain that started today.  No nausea or vomiting or diarrhea.  No chest pain or palpitations.  No dysuria,  oliguria or hematuria or flank pain.  No bleeding diathesis.   ED Course: When the patient came to the ER, BP was 124/102 with respiratory rate of 22 and otherwise normal vital signs.  Labs revealed blood glucose of 145 with otherwise unremarkable CMP.  High-sensitivity troponin was less than 15.  CBC was normal.  UA was unremarkable.   EKG as reviewed by me : EKG showed normal sinus rhythm with a rate of 80. Imaging: Right upper quadrant ultrasound revealed: 1. Cholelithiasis without sonographic evidence of acute cholecystitis. 2. Borderline dilated common bile duct measuring 8 mm, unchanged from prior CT. Distal duct not visualized due to overlying bowel gas. 3. Diffuse hepatic steatosis.   The patient was given 4 mg of IV morphine  sulfate twice, 0.5 mg of IV Dilaudid , 4 mg of IV Zofran , 1 p.o. Percocet and 500 mL IV normal saline bolus.  He will be admitted to a medical telemetry observation bed for further evaluation and  management.  Discharge Diagnoses:  Principal Problem:   Biliary colic Active Problems:   Dyslipidemia   Coronary artery disease   Controlled type 2 diabetes mellitus without complication, without long-term current use of insulin  (HCC)   Essential hypertension  Acute cholecystitis: s/p percutaneous cholecystectomy drain placement. Continue on IV zosyn  and d/c home on augmentin  to complete the course. Gen surg following and recs apprec    HTN: continue on home dose imdur , metoprolol     DM2: well controlled, HbA1c 6.6. Continue on SSI w/ accuchecks   Hx of CAD: continue on imdur , metoprolol , aspirin , plavix , statin   HLD: continue on statin   Obesity: BMI 33.2. Complicates overall care & prognosis   Discharge Instructions  Discharge Instructions     Diet Carb Modified   Complete by: As directed    Discharge instructions   Complete by: As directed    F/u w/ PCP in 1-2 weeks. F/u w/ gen surg, Dr. Rodolph, in 3 weeks.   Increase activity slowly   Complete by: As directed    No wound care   Complete by: As directed       Allergies as of 05/03/2024   No Known Allergies      Medication List     TAKE these medications    acetaminophen  500 MG tablet Commonly known as: TYLENOL  Take 2 tablets (1,000 mg total) by mouth every 6 (six) hours as needed.   albuterol  108 (90 Base) MCG/ACT inhaler Commonly known as: VENTOLIN  HFA Inhale 2 puffs into the lungs every 6 (six) hours as needed for wheezing or shortness of  breath.   amoxicillin -clavulanate 875-125 MG tablet Commonly known as: AUGMENTIN  Take 1 tablet by mouth 2 (two) times daily for 10 days.   aspirin  81 MG chewable tablet Chew 1 tablet (81 mg total) by mouth daily.   clopidogrel  75 MG tablet Commonly known as: PLAVIX  Take 1 tablet (75 mg total) by mouth daily.   isosorbide  mononitrate 30 MG 24 hr tablet Commonly known as: IMDUR  Take 2 tablets (60 mg total) by mouth 2 (two) times daily.   meclizine  25  MG tablet Commonly known as: ANTIVERT  Take 2 tablets (50 mg total) by mouth 2 (two) times daily as needed for dizziness.   metFORMIN  500 MG 24 hr tablet Commonly known as: GLUCOPHAGE -XR Take 2 tablets (1,000 mg total) by mouth 2 (two) times daily with a meal.   metoprolol  tartrate 50 MG tablet Commonly known as: LOPRESSOR  Take 1 tablet (50 mg total) by mouth 2 (two) times daily.   nitroGLYCERIN  0.4 MG SL tablet Commonly known as: NITROSTAT  Place 1 tablet (0.4 mg total) under the tongue every 5 (five) minutes as needed for chest pain.   oxyCODONE -acetaminophen  5-325 MG tablet Commonly known as: PERCOCET/ROXICET Take 1 tablet by mouth every 6 (six) hours as needed for up to 5 days for moderate pain (pain score 4-6) or severe pain (pain score 7-10).   pantoprazole  20 MG tablet Commonly known as: PROTONIX  Take 2 tablets (40 mg total) by mouth daily.   rosuvastatin  40 MG tablet Commonly known as: CRESTOR  Take 1 tablet by mouth once daily   sodium chloride  flush 0.9 % Soln injection 5 mLs by Intracatheter route daily.   sodium chloride  flush 0.9 % Soln injection 5 mLs by Intracatheter route daily. Flush cholecystostomy tube daily with 5 ccs NS   traZODone  50 MG tablet Commonly known as: DESYREL  Take 0.5-1 tablets (25-50 mg total) by mouth at bedtime as needed for sleep.               Durable Medical Equipment  (From admission, onward)           Start     Ordered   05/03/24 1401  For home use only DME 3 n 1  Once        05/03/24 1400   05/03/24 1400  For home use only DME Walker rolling  Once       Question Answer Comment  Walker: With 5 Inch Wheels   Patient needs a walker to treat with the following condition Generalized weakness      05/03/24 1400            Follow-up Information     Piscoya, Aloysius, MD Follow up in 3 week(s).   Specialty: General Surgery Contact information: [address] [place] [address] [phone]          [facility] Interventional Radiology Follow up in 6 week(s).   Specialty: Radiology Why: IR scheduler will call with appoinment date and time. Please call [phone] or after hours number [phone] with any questions or concerns. Contact information: [address] [place]   [address] [phone]               Allergies[1]  Consultations: Gen surg IR   Procedures/Studies: IR Perc Cholecystostomy Result Date: 04/29/2024 INDICATION: Acute cholecystitis. EXAM: ULTRASOUND AND FLUOROSCOPIC-GUIDED CHOLECYSTOSTOMY TUBE PLACEMENT COMPARISON:  NM HIDA, 04/28/2024.  US  Abdomen, 04/27/2024. MEDICATIONS: The patient is currently admitted to the hospital and on intravenous antibiotics. Antibiotics were administered within an  appropriate time frame prior to skin puncture. 0.5 mg Dilaudid  IV and 25 mg Demerol  IV ANESTHESIA/SEDATION: Moderate (conscious) sedation was employed during this procedure. A total of Versed  1 mg and Fentanyl  100 mcg was administered intravenously. Moderate Sedation Time: 21 minutes. The patient's level of consciousness and vital signs were monitored continuously by radiology nursing throughout the procedure under my direct supervision. CONTRAST:  10mL OMNIPAQUE  IOHEXOL  300 MG/ML SOLN - administered into the gallbladder fossa. FLUOROSCOPY TIME:  Fluoroscopic dose; 12.6 mGy COMPLICATIONS: None immediate. PROCEDURE: Informed written consent was obtained from the patient after a discussion of the risks, benefits and alternatives to treatment. Questions regarding the procedure were encouraged and answered. A timeout was performed prior to the initiation of the procedure. The right upper abdominal quadrant was prepped and draped in the usual sterile fashion, and a sterile drape was applied covering the operative field. Maximum barrier sterile technique with sterile gowns and gloves were used for the procedure. A timeout was performed  prior to the initiation of the procedure. Local anesthesia was provided with 1% lidocaine  with epinephrine . Ultrasound scanning of the RIGHT upper quadrant demonstrates a distended gallbladder. Of note, the patient reported pain with ultrasound imaging over the gallbladder. Utilizing a transhepatic approach, a 22 gauge needle was advanced into the gallbladder under direct ultrasound guidance. An ultrasound image was saved for documentation purposes. Appropriate intraluminal puncture was confirmed with the efflux of bile and advancement of an 0.018 wire into the gallbladder lumen. The needle was exchanged for an Accustick set. A Sweeney amount of contrast was injected to confirm appropriate intraluminal positioning. Over a Benson wire, a 10 Fr cholecystomy tube was advanced into the gallbladder fossa, coiled and locked. Bile was aspirated and a Sweeney amount of contrast was injected as several post procedural spot radiographic images were obtained in various obliquities. The catheter was secured to the skin with suture, connected to a drainage bag and a dressing was placed. The patient tolerated the procedure well without immediate post procedural complication. IMPRESSION: Successful ultrasound and fluoroscopic guided placement of a 10 Fr cholecystostomy tube. The patient developed rigors acutely s/p drain placement. This was managed with medications, and his primary team was notified. RECOMMENDATIONS: The patient will return to Vascular Interventional Radiology (VIR) for routine drainage catheter evaluation and exchange in 6-8 weeks. Thom Hall, MD [facility] Radiology Electronically Signed   By: Thom Hall M.D.   On: 04/29/2024 14:56   NM Hepatobiliary Liver Func Result Date: 04/28/2024 EXAM: NM HEPATOBILLARY SCAN 04/28/2024 03:49:19 PM TECHNIQUE: RADIOPHARMACEUTICAL: 5.38 mCi Tc-68m mebrofenin  Dynamic images of the abdomen and pelvis were obtained in the  anterior projection for 1 hour after intravenous administration of radiopharmaceutical. 3 mg IV morphine  was administered and imaging continued for at least 30 minutes. COMPARISON: Ultrasound. CLINICAL HISTORY: RUQ abdominal pain, biliary disease suspected, US  nondiagnostic. FINDINGS: Homogenous uptake within the liver. Normal clearance of the blood pool. Appropriate excretion into the biliary system. Non-filling of the gallbladder at 60 minutes. The gallbladder failed to fill after morphine  augmentation. Activity is visualized in the Sweeney bowel. IMPRESSION: 1. Non-filling of the gallbladder after morphine  augmentation concerning for cystic duct obstruction / acute cholecystitis. 2. Patent common bile duct . 3. these results will be called to the ordering clinician or representative by the radiologist assistant, and communication documented in the pacs or clario dashboard Electronically signed by: Norleen Boxer MD 04/28/2024 03:57 PM EST RP Workstation: [id number]   US  Abdomen Limited RUQ (LIVER/GB) Result  Date: 04/27/2024 EXAM: Right Upper Quadrant Abdominal Ultrasound 04/27/2024 10:10:00 PM TECHNIQUE: Real-time ultrasonography of the right upper quadrant of the abdomen was performed. COMPARISON: 12/27/2021 and CT 06/09/2023. CLINICAL HISTORY: RUQ abdominal pain. FINDINGS: LIVER: Diffuse fatty infiltration of the liver. No intrahepatic biliary ductal dilatation. No evidence of mass. BILIARY SYSTEM: Several Sweeney layering gallstones measuring up to 4 mm. Sludge noted within the gallbladder. No wall thickening or sonographic cholelithiasis. Common bile duct borderline dilated at 8 mm. The distal bile duct is not visualized due to overlying bowel gas. Common bile duct size is stable since prior CT. No pericholecystic fluid. OTHER: No right upper quadrant ascites. IMPRESSION: 1. Cholelithiasis without sonographic evidence of acute cholecystitis. 2. Borderline dilated common bile duct measuring 8 mm, unchanged from  prior CT. Distal duct not visualized due to overlying bowel gas. 3. Diffuse hepatic steatosis. Electronically signed by: Franky Crease MD 04/27/2024 10:32 PM EST RP Workstation: [id number]   (Echo, Carotid, EGD, Colonoscopy, ERCP)    Subjective: Pt c/o fatigue    Discharge Exam: Vitals:   05/03/24 0332 05/03/24 0830  BP: 125/73 (!) 142/84  Pulse: 80 88  Resp: 16 16  Temp: 98.8 F (37.1 C) 97.9 F (36.6 C)  SpO2: 94% 92%   Vitals:   05/02/24 2045 05/02/24 2305 05/03/24 0332 05/03/24 0830  BP: (!) 139/93  125/73 (!) 142/84  Pulse: (!) 101  80 88  Resp: 16  16 16   Temp: (!) 97.5 F (36.4 C)  98.8 F (37.1 C) 97.9 F (36.6 C)  TempSrc: Oral     SpO2: 91% 95% 94% 92%  Weight:      Height:        General: Pt is alert, awake, not in acute distress Cardiovascular:  S1/S2 +, no rubs, no gallops Respiratory: CTA bilaterally, no wheezing, no rhonchi Abdominal: Soft, NT, obese, bowel sounds + Extremities: no cyanosis    The results of significant diagnostics from this hospitalization (including imaging, microbiology, ancillary and laboratory) are listed below for reference.     Microbiology: Recent Results (from the past 240 hours)  Aerobic/Anaerobic Culture w Gram Stain (surgical/deep wound)     Status: None (Preliminary result)   Collection Time: 04/29/24  1:28 PM   Specimen: BILE  Result Value Ref Range Status   Specimen Description   Final    BILE Performed at [facility], [address], [place], [place] [address]    Special Requests   Final    NONE Performed at [facility], [address] [place], [place] [address]    Gram Stain   Final    ABUNDANT WBC PRESENT, PREDOMINANTLY PMN ABUNDANT GRAM NEGATIVE RODS Performed at [facility], [address], [place], [place] [address]    Culture   Final    ABUNDANT KLEBSIELLA PNEUMONIAE NO ANAEROBES ISOLATED; CULTURE IN PROGRESS FOR 5 DAYS    Report Status PENDING  Incomplete   Organism  ID, Bacteria KLEBSIELLA PNEUMONIAE  Final      Susceptibility   Klebsiella pneumoniae - MIC*    AMPICILLIN RESISTANT Resistant     CEFAZOLIN (NON-URINE) 2 SENSITIVE Sensitive     CEFEPIME  <=0.12 SENSITIVE Sensitive     ERTAPENEM <=0.12 SENSITIVE Sensitive     CEFTRIAXONE  <=0.25 SENSITIVE Sensitive     CIPROFLOXACIN  <=0.06 SENSITIVE Sensitive     GENTAMICIN <=1 SENSITIVE Sensitive     MEROPENEM <=0.25 SENSITIVE Sensitive     TRIMETH/SULFA <=20 SENSITIVE Sensitive     AMPICILLIN/SULBACTAM <=  2 SENSITIVE Sensitive     PIP/TAZO Value in next row Sensitive      <=4 SENSITIVEThis is a modified FDA-approved test that has been validated and its performance characteristics determined by the reporting laboratory.  This laboratory is certified under the Clinical Laboratory Improvement Amendments CLIA as qualified to perform high complexity clinical laboratory testing.    * ABUNDANT KLEBSIELLA PNEUMONIAE     Labs: BNP (last 3 results) Recent Labs    11/30/23 1402  BNP 42.4   Basic Metabolic Panel: Recent Labs  Lab 04/27/24 2052 04/28/24 0609 04/29/24 0522 04/30/24 1003  NA 139 138 137 137  K 4.1 4.4 4.1 3.7  CL 101 102 103 103  CO2 25 24 25 24   GLUCOSE 145* 156* 129* 159*  BUN 10 12 14 21   CREATININE 1.08 0.96 1.04 0.93  CALCIUM  9.3 9.2 8.9 8.8*   Liver Function Tests: Recent Labs  Lab 04/27/24 2052 04/28/24 0609 04/29/24 0522  AST 23 23 16   ALT 20 18 10   ALKPHOS 61 55 46  BILITOT 1.0 1.1 2.7*  PROT 7.6 7.0 6.6  ALBUMIN  4.3 4.0 3.5   Recent Labs  Lab 04/27/24 2052  LIPASE 24   No results for input(s): AMMONIA in the last 168 hours. CBC: Recent Labs  Lab 04/27/24 2052 04/28/24 0609 04/29/24 0522 04/30/24 1003 05/01/24 0449  WBC 9.1 14.1* 13.7* 8.6 8.7  HGB 15.4 14.6 13.2 12.1* 11.8*  HCT 44.6 43.1 39.4 37.0* 36.3*  MCV 87.1 88.9 89.1 90.0 91.7  PLT 278 224 193 174 197   Cardiac Enzymes: No results for input(s): CKTOTAL, CKMB, CKMBINDEX, TROPONINI  in the last 168 hours. BNP: Invalid input(s): POCBNP CBG: Recent Labs  Lab 05/02/24 1129 05/02/24 1626 05/02/24 2128 05/03/24 0827 05/03/24 1351  GLUCAP 142* 114* 107* 129* 107*   D-Dimer No results for input(s): DDIMER in the last 72 hours. Hgb A1c No results for input(s): HGBA1C in the last 72 hours. Lipid Profile No results for input(s): CHOL, HDL, LDLCALC, TRIG, CHOLHDL, LDLDIRECT in the last 72 hours. Thyroid  function studies No results for input(s): TSH, T4TOTAL, T3FREE, THYROIDAB in the last 72 hours.  Invalid input(s): FREET3 Anemia work up No results for input(s): VITAMINB12, FOLATE, FERRITIN, TIBC, IRON, RETICCTPCT in the last 72 hours. Urinalysis    Component Value Date/Time   COLORURINE AMBER (A) 04/27/2024 2230   APPEARANCEUR CLEAR (A) 04/27/2024 2230   LABSPEC 1.026 04/27/2024 2230   PHURINE 5.0 04/27/2024 2230   GLUCOSEU NEGATIVE 04/27/2024 2230   HGBUR NEGATIVE 04/27/2024 2230   BILIRUBINUR NEGATIVE 04/27/2024 2230   BILIRUBINUR Sweeney 06/24/2023 1355   KETONESUR NEGATIVE 04/27/2024 2230   PROTEINUR 30 (A) 04/27/2024 2230   UROBILINOGEN 1.0 06/24/2023 1355   NITRITE NEGATIVE 04/27/2024 2230   LEUKOCYTESUR NEGATIVE 04/27/2024 2230   Sepsis Labs Recent Labs  Lab 04/28/24 0609 04/29/24 0522 04/30/24 1003 05/01/24 0449  WBC 14.1* 13.7* 8.6 8.7   Microbiology Recent Results (from the past 240 hours)  Aerobic/Anaerobic Culture w Gram Stain (surgical/deep wound)     Status: None (Preliminary result)   Collection Time: 04/29/24  1:28 PM   Specimen: BILE  Result Value Ref Range Status   Specimen Description   Final    BILE Performed at [facility], [address] [place], [place] [address]    Special Requests   Final    NONE Performed at [facility], [address], [place], [place] [address]    Gram Stain  Final    ABUNDANT WBC PRESENT, PREDOMINANTLY PMN ABUNDANT GRAM NEGATIVE  RODS Performed at [facility], [address], [place], [place] [address]    Culture   Final    ABUNDANT KLEBSIELLA PNEUMONIAE NO ANAEROBES ISOLATED; CULTURE IN PROGRESS FOR 5 DAYS    Report Status PENDING  Incomplete   Organism ID, Bacteria KLEBSIELLA PNEUMONIAE  Final      Susceptibility   Klebsiella pneumoniae - MIC*    AMPICILLIN RESISTANT Resistant     CEFAZOLIN (NON-URINE) 2 SENSITIVE Sensitive     CEFEPIME  <=0.12 SENSITIVE Sensitive     ERTAPENEM <=0.12 SENSITIVE Sensitive     CEFTRIAXONE  <=0.25 SENSITIVE Sensitive     CIPROFLOXACIN  <=0.06 SENSITIVE Sensitive     GENTAMICIN <=1 SENSITIVE Sensitive     MEROPENEM <=0.25 SENSITIVE Sensitive     TRIMETH/SULFA <=20 SENSITIVE Sensitive     AMPICILLIN/SULBACTAM <=2 SENSITIVE Sensitive     PIP/TAZO Value in next row Sensitive      <=4 SENSITIVEThis is a modified FDA-approved test that has been validated and its performance characteristics determined by the reporting laboratory.  This laboratory is certified under the Clinical Laboratory Improvement Amendments CLIA as qualified to perform high complexity clinical laboratory testing.    * ABUNDANT KLEBSIELLA PNEUMONIAE     Time coordinating discharge: 35 minutes  SIGNED:   Anthony CHRISTELLA Pouch, MD  [facility] 05/03/2024, 2:05 PM Pager   If 7PM-7AM, please contact night-coverage [email or web address]      [1] No Known Allergies

## 2024-05-03 NOTE — TOC Progression Note (Signed)
 Transition of Care [facility]) - Progression Note    Patient Details  Name: Jeffery Sweeney MRN: [id number] Date of Birth: [birth year]  [facility] CM/SW Contact  Alfonso Rummer, LCSW Phone Number: 05/03/2024, 11:49 AM  Clinical Narrative:    LCSW A. Rummer met with pt at bedside in room 221A. LCSW A. Anabeth Chilcott contacted [facility] via phone with pt at bedside to completed income verification with [facility] rep. [facility] accepted pt for home health nursing. Pt infomed [facility] will contact him once he discharged to set up home assessment. Pt reports no further TOC needs at this time.                     Expected Discharge Plan and Services                                               Social Drivers of Health (SDOH) Interventions SDOH Screenings   Food Insecurity: No Food Insecurity (04/28/2024)  Housing: Unknown (04/28/2024)  Transportation Needs: No Transportation Needs (04/28/2024)  Utilities: Not At Risk (04/28/2024)  Depression (PHQ2-9): Low Risk (02/23/2024)  Social Connections: Patient Declined (04/28/2024)  Tobacco Use: High Risk (04/27/2024)    Readmission Risk Interventions     No data to display

## 2024-05-03 NOTE — TOC CM/SW Note (Addendum)
 Transition of Care [facility]) CM/SW Note    Transition of Care [facility]) - Inpatient Brief Assessment   Patient Details  Name: Jeffery Sweeney MRN: [id number] Date of Birth: 05/19/1961  Transition of Care [facility]) CM/SW Contact:    Alfonso Rummer, LCSW Phone Number: 05/03/2024, 2:04 PM   Clinical Narrative:  Pt resides in a boarding home and is confined to one room/level in the home and requires use of bsc to engage in activities of daily living.   Transition of Care Asessment: Insurance and Status: Selfpay Patient has primary care physician: Yes (BUTLER, KRISTINA) Home environment has been reviewed: single family home   Prior/Current Home Services: No current home services Social Drivers of Health Review: SDOH reviewed no interventions necessary Readmission risk has been reviewed: No Transition of care needs: no transition of care needs at this time

## 2024-05-04 ENCOUNTER — Telehealth: Payer: Self-pay | Admitting: *Deleted

## 2024-05-04 LAB — AEROBIC/ANAEROBIC CULTURE W GRAM STAIN (SURGICAL/DEEP WOUND)

## 2024-05-04 NOTE — Transitions of Care (Post Inpatient/ED Visit) (Signed)
 05/04/2024  Name: Jeffery Sweeney MRN: [id number] DOB: [birth year]  Today's TOC FU Call Status: Today's TOC FU Call Status:: Successful TOC FU Call Completed TOC FU Call Complete Date: 05/04/24  Patient's Name and Date of Birth confirmed. Name, DOB  Transition Care Management Follow-up Telephone Call Date of Discharge: 05/03/24 Discharge Facility: [facility]) Type of Discharge: Inpatient Admission Primary Inpatient Discharge Diagnosis:: biliary colic How have you been since you were released from the hospital?: Better Any questions or concerns?: Yes Patient Questions/Concerns:: Patient did not understand how to take the augmentin  and the oxycodone  Patient Questions/Concerns Addressed: Other: (RN went over how to take medications)  Items Reviewed: Did you receive and understand the discharge instructions provided?: No Medications obtained,verified, and reconciled?: Yes (Medications Reviewed) Any new allergies since your discharge?: No Dietary orders reviewed?: No Do you have support at home?: Yes People in Home [RPT]: alone (boarding house) Name of Support/Comfort Primary Source: sister April  Medications Reviewed Today: Medications Reviewed Today     Reviewed by Kennieth Cathlean DEL, RN (Case Manager) on 05/04/24 at 1427  Med List Status: <None>   Medication Order Taking? Sig Documenting Provider Last Dose Status Informant  acetaminophen  (TYLENOL ) 500 MG tablet [id number] Yes Take 2 tablets (1,000 mg total) by mouth every 6 (six) hours as needed. Clarine Ozell LABOR, MD  Active Self  albuterol  (VENTOLIN  HFA) [phone] Base) MCG/ACT inhaler [id number] Yes Inhale 2 puffs into the lungs every 6 (six) hours as needed for wheezing or shortness of breath. Towana Small, FNP  Active Self  amoxicillin -clavulanate (AUGMENTIN ) 875-125 MG tablet [id number] Yes Take 1 tablet by mouth 2 (two) times daily for 10 days. Trudy Anthony HERO, MD  Active   aspirin  81  MG chewable tablet [id number] Yes Chew 1 tablet (81 mg total) by mouth daily. Towana Small, FNP  Active Self  clopidogrel  (PLAVIX ) 75 MG tablet [id number] Yes Take 1 tablet (75 mg total) by mouth daily. Darron Deatrice LABOR, MD  Active Self  isosorbide  mononitrate (IMDUR ) 30 MG 24 hr tablet [id number] Yes Take 2 tablets (60 mg total) by mouth 2 (two) times daily. Towana Small, FNP  Active Self  meclizine  (ANTIVERT ) 25 MG tablet [id number] Yes Take 2 tablets (50 mg total) by mouth 2 (two) times daily as needed for dizziness. Towana Small, FNP  Active Self  metFORMIN  (GLUCOPHAGE -XR) 500 MG 24 hr tablet [id number] Yes Take 2 tablets (1,000 mg total) by mouth 2 (two) times daily with a meal. Towana Small, FNP  Active Self  metoprolol  tartrate (LOPRESSOR ) 50 MG tablet [id number] Yes Take 1 tablet (50 mg total) by mouth 2 (two) times daily. Towana Small, FNP  Active Self  nitroGLYCERIN  (NITROSTAT ) 0.4 MG SL tablet [id number] Yes Place 1 tablet (0.4 mg total) under the tongue every 5 (five) minutes as needed for chest pain. Gerard Frederick, NP  Active Self  oxyCODONE -acetaminophen  (PERCOCET/ROXICET) 5-325 MG tablet [id number] Yes Take 1 tablet by mouth every 6 (six) hours as needed for up to 5 days for moderate pain (pain score 4-6) or severe pain (pain score 7-10). Trudy Anthony HERO, MD  Active   pantoprazole  (PROTONIX ) 20 MG tablet [id number] Yes Take 2 tablets (40 mg total) by mouth daily. Towana Small, FNP  Active Self  rosuvastatin  (CRESTOR ) 40 MG tablet [id number] Yes Take 1 tablet by mouth once daily Towana Small, FNP  Active Self  sodium chloride  flush 0.9 % SOLN injection [id number] Yes 5 mLs by Intracatheter route daily. Piscoya, Jose,  MD  Active   sodium chloride  flush 0.9 % SOLN injection [id number] Yes 5 mLs by Intracatheter route daily. Flush cholecystostomy tube daily with 5 ccs NS Schulz, Zachary R, PA-C  Active   traZODone  (DESYREL ) 50 MG tablet [id number] Yes Take 0.5-1 tablets  (25-50 mg total) by mouth at bedtime as needed for sleep. Towana Small, FNP  Active Self            Home Care and Equipment/Supplies: Were Home Health Services Ordered?: NA Any new equipment or medical supplies ordered?: Yes Name of Medical supply agency?: [employer] Were you able to get the equipment/medical supplies?: Yes Do you have any questions related to the use of the equipment/supplies?: No  Functional Questionnaire: Do you need assistance with bathing/showering or dressing?: No Do you need assistance with meal preparation?: No Do you need assistance with eating?: No Do you have difficulty maintaining continence: No Do you need assistance with getting out of bed/getting out of a chair/moving?: Yes (RW) Do you have difficulty managing or taking your medications?: No  Follow up appointments reviewed: PCP Follow-up appointment confirmed?: Yes Date of PCP follow-up appointment?: 05/17/24 Follow-up Provider: Small Towana FNP Specialist Hospital Follow-up appointment confirmed?: No Reason Specialist Follow-Up Not Confirmed: Patient has Specialist Provider Number and will Call for Appointment Do you need transportation to your follow-up appointment?: No Do you understand care options if your condition(s) worsen?: Yes-patient verbalized understanding  SDOH Interventions Today    Flowsheet Row Most Recent Value  SDOH Interventions   Food Insecurity Interventions Intervention Not Indicated  Housing Interventions Intervention Not Indicated  [lives in a boarding home]  Transportation Interventions Intervention Not Indicated  Utilities Interventions Intervention Not Indicated  Depression Interventions/Treatment  PHQ2-9 Score <4 Follow-up Not Indicated    Goals Addressed             This Visit's Progress    [facility] Transitions of Care (TOC) Care Plan       Problems:  Recent Hospitalization for treatment of Biliary colic Knowledge Deficit Related to biliary colic and No  Hospital Follow Up Provider appointment Rn made follow up appointment with PCP  Goal:  Over the next 30 days, the patient will not experience hospital readmission  Interventions:  Transitions of Care: Doctor Visits  - discussed the importance of doctor visits Referral to Longitudinal Nurse Case Manager for Ongoing follow-up Arranged PCP follow-up within 12-14 days (Care Guide Scheduled)  Patient Self Care Activities:  Attend all scheduled provider appointments Call pharmacy for medication refills 3-7 days in advance of running out of medications Call provider office for new concerns or questions  Notify RN Care Manager of TOC call rescheduling needs Participate in Transition of Care Program/Attend TOC scheduled calls Perform all self care activities independently  Take medications as prescribed   Empty biliary drain and measure Monitor pain and take pain medication as per ordered Monitor for any redness around tube site Drain site and care  Plan:  An initial telephone outreach has been scheduled for: [id number] Next PCP appointment scheduled for: [phone] Telephone follow up appointment with care management team member scheduled for: [id number] Laine Tousey       Discussed and offered 30 day TOC program.  Patient  accepted.  The patient has been provided with contact information for the care management team and has been advised to call with any health -related questions or concerns.  The patient verbalized understanding with current plan of care.  The patient is directed to their insurance card  regarding availability of benefits coverage    Cathlean Headland BSN RN [facility] Care Management Coordinator [email or web address] Direct Dial: [phone]  Fax: [phone] Website: [email or web address]

## 2024-05-04 NOTE — Transitions of Care (Post Inpatient/ED Visit) (Signed)
° °  05/04/2024  Name: Jeffery Sweeney MRN: [id number] DOB: [birth year]  Today's TOC FU Call Status: Today's TOC FU Call Status:: Unsuccessful Call (1st Attempt) Unsuccessful Call (1st Attempt) Date: 05/04/24  Attempted to reach the patient regarding the most recent Inpatient/ED visit.  Follow Up Plan: Additional outreach attempts will be made to reach the patient to complete the Transitions of Care (Post Inpatient/ED visit) call.   Cathlean Headland BSN RN [place] [facility] Care Management Coordinator [email or web address] Direct Dial: [phone]  Fax: [phone] Website: [email or web address]

## 2024-05-06 ENCOUNTER — Other Ambulatory Visit: Payer: Self-pay

## 2024-05-10 ENCOUNTER — Telehealth: Payer: Self-pay | Admitting: *Deleted

## 2024-05-10 ENCOUNTER — Encounter: Payer: Self-pay | Admitting: *Deleted

## 2024-05-11 ENCOUNTER — Other Ambulatory Visit: Payer: Self-pay | Admitting: *Deleted

## 2024-05-11 NOTE — Patient Instructions (Signed)
 Visit Information  Thank you for taking time to visit with me today. Please don't hesitate to contact me if I can be of assistance to you before our next scheduled telephone appointment.  Our next appointment is by telephone on Wednesday, May 18, 2024 at 2:00 pm  Please call the care guide team at [phone] if you need to cancel or reschedule your appointment.   Following are the goals we discussed today:  Patient Self Care Activities:  Attend all scheduled provider appointments Call pharmacy for medication refills 3-7 days in advance of running out of medications Call provider office for new concerns or questions  Participate in Transition of Care Program/Attend TOC scheduled calls Take medications as prescribed   Empty biliary drain and measure as you have been, as instructed after your recent hospital visit Monitor pain and take pain medication as per ordered Monitor for any redness around tube site Continue monitoring and recording your blood sugars at home If you believe your condition is getting worse- contact your care providers (doctors) promptly- reaching out to your doctor early when you have concerns can prevent you from having to go to the hospital  If you are experiencing a Mental Health or Behavioral Health Crisis or need someone to talk to, please  call the Suicide and Crisis Lifeline: 988 call the [place]  National Suicide Prevention Lifeline: 407-282-0834 or TTY: 574-140-6242 TTY 908-044-8881) to talk to a trained counselor call 1-800-273-TALK (toll free, 24 hour hotline) go to [facility] [address] [place] [phone]) call the [facility] Crisis Line: [phone] call 911   Care plan and visit instructions communicated with the patient verbally today. The patient DECLINED to receive copy of care plan and patient instructions in any format.   Kamilo Och Mckinney Londen Lorge, RN, BSN, Media Planner   Transitions of Care  [facility] - [facility] [phone]: direct office

## 2024-05-11 NOTE — Transitions of Care (Post Inpatient/ED Visit) (Signed)
 " Transition of Care week 2/ day # 7  Visit Note  05/11/2024  Name: Jeffery Sweeney MRN: [id number]          DOB: 1960/09/29  Situation: Patient enrolled in [facility] 30-day program. Visit completed with patient by telephone.   HIPAA identifiers x 2 verified  Background:  Recent hospitalization December 10- 16, 2025 for (R) upper quadrant abdominal pain/ biliary colic with percutaneous drain placement Independent; drives self; does not use assistive devices at baseline (1) unplanned hospital admission x last (6)/ (12) months  Initial Transition Care Management Follow-up Telephone Call Discharge Date and Diagnosis: 05/03/24, biliary colic   Past Medical History:  Diagnosis Date   CVA (cerebral vascular accident) (HCC) 06/2021   Small acute/subacute infarcts in the left occipital lobe and   Hyperlipidemia LDL goal <70    started on Repatha  09/26/2021, continue Crestor  40mg  daily   Hypertension    Multiple vessel coronary artery disease    Myocardial infarct (HCC)    x 3; PCI with single stent placement for each (in [place] )   NSTEMI (non-ST elevated myocardial infarction) (HCC)    LHC 2/18 with multivessel CAD and transfer to [facility] for CABG eval   STEMI (ST elevation myocardial infarction) (HCC) 09/23/2021   LHC 09/23/2021 x 2   Tobacco use    2 cigarettes daily   Unstable angina (HCC)    LHC 09/23/2021 x 2; total 8 DES to LIMA   Assessment:  I am doing fine, not having to use the walker any more and managing the drain on my own without any problems; emptying it by myself.  Still taking the antibiotics twice a day like I am supposed to; planning to see the doctors like they are scheduled    Denies clinical concerns and sounds to be in no distress throughout [facility] 30-day program outreach call today  Patient Reported Symptoms: Cognitive Cognitive Status: Normal speech and language skills, Alert and oriented to person, place, and time, Insightful and able to interpret abstract  concepts Cognitive/Intellectual Conditions Management [RPT]: None reported or documented in medical history or problem list      Neurological Neurological Review of Symptoms: No symptoms reported    HEENT HEENT Symptoms Reported: No symptoms reported      Cardiovascular Cardiovascular Symptoms Reported: No symptoms reported Does patient have uncontrolled Hypertension?: No Cardiovascular Management Strategies: Routine screening, Coping strategies, Medication therapy, Adequate rest  Respiratory Respiratory Symptoms Reported: No symptoms reported Other Respiratory Symptoms: Denies shortness of breath: I am breathing fine sounds to be in no respiratory distress thorughout TOC call Respiratory Management Strategies: Adequate rest  Endocrine Endocrine Symptoms Reported: No symptoms reported Is patient diabetic?: Yes Is patient checking blood sugars at home?: Yes List most recent blood sugar readings, include date and time of day: They are running in the 100's- they look fine; I don't have the specific numbers right in front of me to review with you- I am still in bed    Gastrointestinal Gastrointestinal Symptoms Reported: No symptoms reported Additional Gastrointestinal Details: No problems eating and having normal and regular BM's; had a normal BM this morning      Genitourinary Genitourinary Symptoms Reported: No symptoms reported    Integumentary Integumentary Symptoms Reported: Other, Incision Other Integumentary Symptoms: I can't really see the skin where the drain is, but it isn't giving me any concerns; the drain is still draining milky white fluid like it has been.  I have been managing the drain without any problems,  on my own- no one is helping me Skin Management Strategies: Routine screening, Coping strategies  Musculoskeletal Musculoskelatal Symptoms Reviewed: No symptoms reported Additional Musculoskeletal Details: confirmed not currently requiring/ using assistive  devices for ambulation - Not having to use the walker any more; Denies new/ recent falls post- recent hospital discharge 05/03/24 Musculoskeletal Management Strategies: Coping strategies   Fall risk Follow up: Falls prevention discussed  Psychosocial Psychosocial Symptoms Reported: No symptoms reported Behavioral Management Strategies: Coping strategies Major Change/Loss/Stressor/Fears (CP): Denies     There were no vitals filed for this visit. Pain Scale: 0-10 Pain Score: 0-No pain  Medications Reviewed Today     Reviewed by Amelia Burgard M, RN (Registered Nurse) on 05/11/24 at [phone]  Med List Status: <None>   Medication Order Taking? Sig Documenting Provider Last Dose Status Informant  acetaminophen  (TYLENOL ) 500 MG tablet [id number]  Take 2 tablets (1,000 mg total) by mouth every 6 (six) hours as needed. Clarine Ozell LABOR, MD  Active Self  albuterol  (VENTOLIN  HFA) 108 (90 Base) MCG/ACT inhaler [id number]  Inhale 2 puffs into the lungs every 6 (six) hours as needed for wheezing or shortness of breath. Towana Small, FNP  Active Self  amoxicillin -clavulanate (AUGMENTIN ) 875-125 MG tablet [id number] Yes Take 1 tablet by mouth 2 (two) times daily for 10 days. Trudy Anthony HERO, MD  Active   aspirin  81 MG chewable tablet [id number]  Chew 1 tablet (81 mg total) by mouth daily. Towana Small, FNP  Active Self  clopidogrel  (PLAVIX ) 75 MG tablet [id number]  Take 1 tablet (75 mg total) by mouth daily. Darron Deatrice LABOR, MD  Active Self  isosorbide  mononitrate (IMDUR ) 30 MG 24 hr tablet [id number]  Take 2 tablets (60 mg total) by mouth 2 (two) times daily. Towana Small, FNP  Active Self  meclizine  (ANTIVERT ) 25 MG tablet [id number]  Take 2 tablets (50 mg total) by mouth 2 (two) times daily as needed for dizziness. Towana Small, FNP  Active Self  metFORMIN  (GLUCOPHAGE -XR) 500 MG 24 hr tablet [id number]  Take 2 tablets (1,000 mg total) by mouth 2 (two) times daily with a meal. Towana Small,  FNP  Active Self  metoprolol  tartrate (LOPRESSOR ) 50 MG tablet [id number]  Take 1 tablet (50 mg total) by mouth 2 (two) times daily. Towana Small, FNP  Active Self  nitroGLYCERIN  (NITROSTAT ) 0.4 MG SL tablet [id number]  Place 1 tablet (0.4 mg total) under the tongue every 5 (five) minutes as needed for chest pain. Gerard Frederick, NP  Active Self  pantoprazole  (PROTONIX ) 20 MG tablet [id number]  Take 2 tablets (40 mg total) by mouth daily. Towana Small, FNP  Active Self  rosuvastatin  (CRESTOR ) 40 MG tablet [id number]  Take 1 tablet by mouth once daily Towana Small, FNP  Active Self  sodium chloride  flush 0.9 % SOLN injection [id number]  5 mLs by Intracatheter route daily. Desiderio Schanz, MD  Active   sodium chloride  flush 0.9 % SOLN injection [id number]  5 mLs by Intracatheter route daily. Flush cholecystostomy tube daily with 5 ccs NS Schulz, Zachary R, PA-C  Active   traZODone  (DESYREL ) 50 MG tablet [id number]  Take 0.5-1 tablets (25-50 mg total) by mouth at bedtime as needed for sleep. Towana Small, FNP  Active Self           Recommendation:   PCP Follow-up: as scheduled 05/07/24 Specialty provider follow-up: as scheduled with surgical provider 05/21/23 Continue Current Plan of Care  Follow Up Plan:   Telephone follow-up in 1 week- as scheduled  05/18/24  I appreciate the opportunity to participate in Kanin's care,   Pls call/ message for questions,  Ferrin Liebig Mckinney Zayanna Pundt, RN, BSN, CCRN Alumnus RN Care Manager  Transitions of Care  [facility] - [facility] [phone]: direct office     "

## 2024-05-17 ENCOUNTER — Ambulatory Visit (INDEPENDENT_AMBULATORY_CARE_PROVIDER_SITE_OTHER): Payer: Self-pay | Admitting: Family Medicine

## 2024-05-17 ENCOUNTER — Encounter: Payer: Self-pay | Admitting: Family Medicine

## 2024-05-17 VITALS — BP 128/85 | HR 79 | Resp 16 | Ht 63.25 in | Wt 180.0 lb

## 2024-05-17 DIAGNOSIS — I10 Essential (primary) hypertension: Secondary | ICD-10-CM

## 2024-05-17 DIAGNOSIS — E119 Type 2 diabetes mellitus without complications: Secondary | ICD-10-CM

## 2024-05-17 DIAGNOSIS — K810 Acute cholecystitis: Secondary | ICD-10-CM

## 2024-05-17 NOTE — Progress Notes (Signed)
 "  Established Patient Office Visit  Subjective  Patient ID: Jeffery Sweeney, male    DOB: 09/05/1960  Age: 63 y.o. MRN: [id number]  Chief Complaint  Patient presents with   Hospitalization Follow-up   Discussed the use of AI scribe software for clinical note transcription with the patient, who gave verbal consent to proceed.  History of Present Illness   Jeffery Sweeney is a 63 year old male who presents for a hospital follow-up. He currently has a percutaneous cholecystectomy drain in place from a gallbladder infection. He was admitted from 12/11-12/16/2025 for biliary colic/ acute cholecystitis.   He has a history of gallstones and recently developed a gallbladder infection requiring drainage. The drain is intact and secured. He reports the drainage has decreased over the past few days.  He experiences discomfort from the drainage device, particularly when wearing a seatbelt, as it causes irritation at the site. The site is covered, and he changes the dressing as needed.   He has made dietary changes, avoiding soft drinks and foods like hamburgers and corn, which are difficult to digest. He consumes low-fat options and can eat small amounts of sherbet.  During his hospital stay, he had elevated blood pressure due to gallbladder pain, described as significant pain below the ribs, akin to being punched. This led to the discovery of his gallbladder issues through blood work and an ultrasound.  He experienced constipation during the hospital stay, likely due to pain medications. He found relief after consuming hot chocolate at home, which helped restore regular bowel movements.      ROS: see HPI     Objective:     BP 128/85   Pulse 79   Resp 16   Ht 5' 3.25 (1.607 m)   Wt 180 lb (81.6 kg)   SpO2 96%   BMI 31.63 kg/m  BP Readings from Last 3 Encounters:  05/17/24 128/85  05/03/24 (!) 142/84  02/23/24 137/86     Physical Exam Vitals reviewed.  Constitutional:       Appearance: Normal appearance.  Cardiovascular:     Rate and Rhythm: Normal rate and regular rhythm.     Pulses: Normal pulses.     Heart sounds: Normal heart sounds.  Pulmonary:     Effort: Pulmonary effort is normal.     Breath sounds: Normal breath sounds.  Abdominal:     General: Bowel sounds are normal. There is distension.     Palpations: Abdomen is soft.     Tenderness: There is abdominal tenderness.      Comments: Perc cholecystectomy drain in place with yellow drainage present in collection bag. Skin intact without significant erythema. A couple scabs present around insertion site, without drainage or pus from surgical incision. Site cleaned with CHG and dressing changed.   Neurological:     Mental Status: He is alert.  Psychiatric:        Mood and Affect: Mood normal.        Behavior: Behavior normal.     Assessment & Plan:   1. Acute cholecystitis (Primary) Patient is a 63 year old male who was recently admitted (from 12/11-12/16) for acute onset of epigastric and RUQ abdominal pain, with RUQ US  showing cholelithiasis, borderline dilated common bile duct measuring 8mm, and diffuse hepatic steatosis. He has surgical consultation, as an outpatient, scheduled for January 2nd with Dr. Desiderio. Dressing changed in office today. Insertion site with slight erythema and a few scabs present, no drainage present on old dressing. New  dressing secured and discussed would care with patient. No repeat labs necessary today.   2. Controlled type 2 diabetes mellitus without complication, without long-term current use of insulin  (HCC) Hemoglobin A1c under goal at 6.6% on 04/30/2024. Discussed continued lifestyle modifications, daily exercise, and taking metformin  1000mg  BID. No refills needed today.   3. Primary hypertension Patient presents today with well-controlled blood pressure. Patient in no acute distress and is well-appearing. Denies chest pain, shortness of breath, lower  extremity edema, vision changes, headaches. Cardiovascular exam with heart regular rate and rhythm. Normal heart sounds, no murmurs present. No lower extremity edema present. Lungs clear to auscultation bilaterally. Patient is currently taking Imdur  60mg  BID and metoprolol  50mg  BID. No refills needed.    Return if symptoms worsen or fail to improve.    Evalene Arts, FNP "

## 2024-05-17 NOTE — Patient Instructions (Signed)

## 2024-05-18 ENCOUNTER — Other Ambulatory Visit: Payer: Self-pay | Admitting: *Deleted

## 2024-05-18 NOTE — Transitions of Care (Post Inpatient/ED Visit) (Signed)
 " Transition of Care week 3/ day # 14  Visit Note  05/18/2024  Name: Jeffery Sweeney MRN: [id number]          DOB: [birth year]  Situation: Patient enrolled in [facility] 30-day program. Visit completed with patient by telephone.   HIPAA identifiers x 2 verified  Background:  Recent hospitalization December 10- 16, 2025 for (R) upper quadrant abdominal pain/ biliary colic with percutaneous drain placement Independent; drives self; does not use assistive devices at baseline (1) unplanned hospital admission x last (6)/ (12) months  Initial Transition Care Management Follow-up Telephone Call Discharge Date and Diagnosis: 05/03/24, biliary colic   Past Medical History:  Diagnosis Date   CVA (cerebral vascular accident) (HCC) 06/2021   Small acute/subacute infarcts in the left occipital lobe and   Hyperlipidemia LDL goal <70    started on Repatha  09/26/2021, continue Crestor  40mg  daily   Hypertension    Multiple vessel coronary artery disease    Myocardial infarct (HCC)    x 3; PCI with single stent placement for each (in [place] )   NSTEMI (non-ST elevated myocardial infarction) (HCC)    LHC 2/18 with multivessel CAD and transfer to [facility] for CABG eval   STEMI (ST elevation myocardial infarction) (HCC) 09/23/2021   LHC 09/23/2021 x 2   Tobacco use    2 cigarettes daily   Unstable angina (HCC)    LHC 09/23/2021 x 2; total 8 DES to LIMA   Assessment:  I am still doing just fine, saw my doctor yesterday and she said everything looks great- she changed the drain dressing and said it looks good.  I am emptying the drain by myself and not having any problems.  Eating good and going to the bathroom normally.  Not using any walker or cane, driving myself wherever I need to go.  My blood sugars look good at home, I only check them every couple of days.  The doctor yesterday said my blood sugars look real good- she was very happy with them.  I finished all the antibiotics up shortly after Christmas  and took them all like they told me to    Denies clinical concerns and sounds to be in no distress throughout [facility] 30-day program outreach call today  Patient Reported Symptoms: Cognitive Cognitive Status: Normal speech and language skills, Alert and oriented to person, place, and time, Insightful and able to interpret abstract concepts Cognitive/Intellectual Conditions Management [RPT]: None reported or documented in medical history or problem list      Neurological Neurological Review of Symptoms: No symptoms reported    HEENT HEENT Symptoms Reported: No symptoms reported      Cardiovascular Cardiovascular Symptoms Reported: No symptoms reported Does patient have uncontrolled Hypertension?: No Cardiovascular Management Strategies: Medication therapy, Routine screening, Coping strategies, Adequate rest  Respiratory Respiratory Symptoms Reported: No symptoms reported Other Respiratory Symptoms: Denies shortness of breath and sounds to be in no respiratory distress throughout [facility] call Respiratory Management Strategies: Adequate rest  Endocrine Endocrine Symptoms Reported: No symptoms reported Is patient diabetic?: Yes Is patient checking blood sugars at home?: Yes List most recent blood sugar readings, include date and time of day: I only check my blood sugars every other day; I haven't got my papers with me, but I haven't seen any real low or real high numbers since I got home from the hospital    Gastrointestinal Gastrointestinal Symptoms Reported: No symptoms reported, Other Other Gastrointestinal Symptoms: Reports continues to manage abdominal JP drain independently, denies  issues- concerns around self- management of drain; confirmed that patient attended PCP hospital follow up office visit as scheduled 05/17/24: she said the drain looked fine; it is draining correctly and she even changed the dressing for me- said she had no concerns; reports brown-ish colored liquid in the drain  Emptying drain 1-2 times per day Additional Gastrointestinal Details: I eat pretty good; not a real big appetite, but I eat enough  Confirms having normal and regular BM's and reports last bowel movement earlier today Gastrointestinal Management Strategies: Coping strategies    Genitourinary Genitourinary Symptoms Reported: No symptoms reported Additional Genitourinary Details: Peeing normally, light yellow clear urine    Integumentary Integumentary Symptoms Reported: Other Other Integumentary Symptoms: Reports skin around abdominal drain looks good; the doctor yesterday said it looks good too; confirms plans to attend surgical provider office visit as scheduled 05/20/24 Skin Management Strategies: Routine screening, Dressing changes, Coping strategies  Musculoskeletal Musculoskelatal Symptoms Reviewed: No symptoms reported Additional Musculoskeletal Details: confirmed not currently requiring/ using assistive devices for ambulation; confirmed no new/ recent falls post- hospital discharge 05/03/24 Musculoskeletal Management Strategies: Coping strategies Falls in the past year?: No Number of falls in past year: 1 or less Was there an injury with Fall?: No (N/A- no falls reported x > 12 months) Fall Risk Category Calculator: 0 Patient Fall Risk Level: Low Fall Risk Patient at Risk for Falls Due to: No Fall Risks Fall risk Follow up: Falls prevention discussed, Education provided, Falls evaluation completed  Psychosocial Psychosocial Symptoms Reported: No symptoms reported Behavioral Management Strategies: Support system, Coping strategies Major Change/Loss/Stressor/Fears (CP): Denies Techniques to Cope with Loss/Stress/Change: Diversional activities Quality of Family Relationships: supportive   There were no vitals filed for this visit. Pain Scale: 0-10 Pain Score: 0-No pain  Medications Reviewed Today     Reviewed by Donette Mainwaring M, RN (Registered Nurse) on 05/18/24 at 1412  Med  List Status: <None>   Medication Order Taking? Sig Documenting Provider Last Dose Status Informant  acetaminophen  (TYLENOL ) 500 MG tablet [id number]  Take 2 tablets (1,000 mg total) by mouth every 6 (six) hours as needed. Clarine Ozell LABOR, MD  Active Self  albuterol  (VENTOLIN  HFA) 108 (90 Base) MCG/ACT inhaler [id number]  Inhale 2 puffs into the lungs every 6 (six) hours as needed for wheezing or shortness of breath. Towana Small, FNP  Active Self  aspirin  81 MG chewable tablet [id number]  Chew 1 tablet (81 mg total) by mouth daily. Towana Small, FNP  Active Self  clopidogrel  (PLAVIX ) 75 MG tablet [id number]  Take 1 tablet (75 mg total) by mouth daily. Darron Deatrice LABOR, MD  Active Self  isosorbide  mononitrate (IMDUR ) 30 MG 24 hr tablet [id number]  Take 2 tablets (60 mg total) by mouth 2 (two) times daily. Towana Small, FNP  Active Self  meclizine  (ANTIVERT ) 25 MG tablet [id number]  Take 2 tablets (50 mg total) by mouth 2 (two) times daily as needed for dizziness. Towana Small, FNP  Active Self  metFORMIN  (GLUCOPHAGE -XR) 500 MG 24 hr tablet [id number]  Take 2 tablets (1,000 mg total) by mouth 2 (two) times daily with a meal. Towana Small, FNP  Active Self  metoprolol  tartrate (LOPRESSOR ) 50 MG tablet [id number]  Take 1 tablet (50 mg total) by mouth 2 (two) times daily. Towana Small, FNP  Active Self  nitroGLYCERIN  (NITROSTAT ) 0.4 MG SL tablet [id number]  Place 1 tablet (0.4 mg total) under the tongue every 5 (five) minutes as needed for chest pain. Gerard Frederick, NP  Active Self  pantoprazole  (PROTONIX ) 20 MG tablet [id number]  Take 2 tablets (40 mg total) by mouth daily. Towana Small, FNP  Active Self  rosuvastatin  (CRESTOR ) 40 MG tablet [id number]  Take 1 tablet by mouth once daily Towana Small, FNP  Active Self  sodium chloride  flush 0.9 % SOLN injection [id number]  5 mLs by Intracatheter route daily. Desiderio Schanz, MD  Active   sodium chloride  flush 0.9 % SOLN injection  [id number]  5 mLs by Intracatheter route daily. Flush cholecystostomy tube daily with 5 ccs NS Schulz, Zachary R, PA-C  Active   traZODone  (DESYREL ) 50 MG tablet [id number]  Take 0.5-1 tablets (25-50 mg total) by mouth at bedtime as needed for sleep. Towana Small, FNP  Active Self           Recommendation:   Specialty provider follow-up: surgical provider as scheduled 05/20/24 Continue Current Plan of Care  Follow Up Plan:   Telephone follow-up in 1 week- as scheduled 05/24/24  Pls call/ message for questions,  Yazmine Sorey Mckinney Dayquan Buys, RN, BSN, CCRN Alumnus RN Care Manager  Transitions of Care  [facility] - [facility] [phone]: direct office     "

## 2024-05-18 NOTE — Patient Instructions (Signed)
 Visit Information  Thank you for taking time to visit with me today. Please don't hesitate to contact me if I can be of assistance to you before our next scheduled telephone appointment.  Our next appointment is by telephone on Tuesday, May 24, 2024 at 2:00 pm  Please call the care guide team at [phone] if you need to cancel or reschedule your appointment.   Following are the goals we discussed today:  Patient Self Care Activities:  Attend all scheduled provider appointments Call pharmacy for medication refills 3-7 days in advance of running out of medications Call provider office for new concerns or questions  Participate in Transition of Care Program/Attend TOC scheduled calls Take medications as prescribed   Empty biliary drain and measure as you have been, as instructed after your recent hospital visit Monitor pain and take pain medication as per ordered Monitor for any redness around tube site Continue monitoring and recording your blood sugars at home If you believe your condition is getting worse- contact your care providers (doctors) promptly- reaching out to your doctor early when you have concerns can prevent you from having to go to the hospital  If you are experiencing a Mental Health or Behavioral Health Crisis or need someone to talk to, please  call the Suicide and Crisis Lifeline: 988 call the [place]  National Suicide Prevention Lifeline: 9403496242 or TTY: 952-179-8425 TTY 617-440-5472) to talk to a trained counselor call 1-800-273-TALK (toll free, 24 hour hotline) go to [facility] [address], [place] [phone]) call the [facility] Crisis Line: 316-717-1928 call 911   Care plan and visit instructions communicated with the patient verbally today. The patient DECLINED to receive copy of care plan and patient instructions in any format.   Liandra Mendia Mckinney Sherl Yzaguirre, RN, BSN, Media Planner   Transitions of Care  [facility] - [facility] [phone]: direct office

## 2024-05-20 ENCOUNTER — Telehealth: Payer: Self-pay | Admitting: *Deleted

## 2024-05-20 ENCOUNTER — Telehealth: Payer: Self-pay | Admitting: Cardiovascular Disease

## 2024-05-20 ENCOUNTER — Ambulatory Visit: Payer: Self-pay | Admitting: Surgery

## 2024-05-20 ENCOUNTER — Encounter: Payer: Self-pay | Admitting: Surgery

## 2024-05-20 VITALS — BP 135/87 | HR 86 | Ht 63.0 in | Wt 179.0 lb

## 2024-05-20 DIAGNOSIS — K810 Acute cholecystitis: Secondary | ICD-10-CM

## 2024-05-20 DIAGNOSIS — K8000 Calculus of gallbladder with acute cholecystitis without obstruction: Secondary | ICD-10-CM

## 2024-05-20 NOTE — Telephone Encounter (Signed)
 Faxed Medical Clearance to Evalene Arts FNP at [phone]

## 2024-05-20 NOTE — Telephone Encounter (Signed)
 Pt has been scheduled in office appt 05/26/24 10:55 Medford Meager, FNP.   Per pt the surgeon will need hold recommendations for Plavix . This was left off when entered into preop format.

## 2024-05-20 NOTE — Patient Instructions (Signed)
 You have requested to have your gallbladder removed. This will be done at [facility] with Dr. Desiderio.   If you are on any injectable weight loss medication, you will need to stop taking your GLP-1 injectable (weight loss) medications 8 days before your surgery to avoid any complications with anesthesia.    You will most likely be out of work 1-2 weeks for this surgery.  If you have FMLA or disability paperwork that needs filled out you may drop this off at our office or this can be faxed to [phone].   You will return after your post-op appointment with a lifting restriction for approximately 4 more weeks.   You will be able to eat anything you would like to following surgery. But, start by eating a bland diet and advance this as tolerated. The Gallbladder diet is below, please go as closely by this diet as possible prior to surgery to avoid any further attacks.   Please see the (blue)pre-care form that you have been given today. You will receive several phone calls from [facility] prior to your surgery date.   If you have any questions, please call our office.   Laparoscopic Cholecystectomy Laparoscopic cholecystectomy is surgery to remove the gallbladder. The gallbladder is located in the upper right part of the abdomen, behind the liver. It is a storage sac for bile, which is produced in the liver. Bile aids in the digestion and absorption of fats. Cholecystectomy is often done for inflammation of the gallbladder (cholecystitis). This condition is usually caused by a buildup of gallstones (cholelithiasis) in the gallbladder. Gallstones can block the flow of bile, and that can result in inflammation and pain. In severe cases, emergency surgery may be required. If emergency surgery is not required, you will have time to prepare for the procedure. Laparoscopic surgery is an alternative to open surgery. Laparoscopic surgery has a shorter recovery time. Your common bile duct may also  need to be examined during the procedure. If stones are found in the common bile duct, they may be removed. LET [facility] CARE PROVIDER KNOW ABOUT: Any allergies you have. All medicines you are taking, including vitamins, herbs, eye drops, creams, and over-the-counter medicines. Previous problems you or members of your family have had with the use of anesthetics. Any blood disorders you have. Previous surgeries you have had.  Any medical conditions you have. RISKS AND COMPLICATIONS Generally, this is a safe procedure. However, problems may occur, including: Infection. Bleeding. Allergic reactions to medicines. Damage to other structures or organs. A stone remaining in the common bile duct. A bile leak from the cyst duct that is clipped when your gallbladder is removed. The need to convert to open surgery, which requires a larger incision in the abdomen. This may be necessary if your surgeon thinks that it is not safe to continue with a laparoscopic procedure. BEFORE THE PROCEDURE Ask your health care provider about: Changing or stopping your regular medicines. This is especially important if you are taking diabetes medicines or blood thinners. Taking medicines such as aspirin and ibuprofen . These medicines can thin your blood. Do not take these medicines before your procedure if your health care provider instructs you not to. Follow instructions from your health care provider about eating or drinking restrictions. Let your health care provider know if you develop a cold or an infection before surgery. Plan to have someone take you home after the procedure. Ask your health care provider how your surgical site will be  marked or identified. You may be given antibiotic medicine to help prevent infection. PROCEDURE To reduce your risk of infection: Your health care team will wash or sanitize their hands. Your skin will be washed with soap. An IV tube may be inserted into one of your  veins. You will be given a medicine to make you fall asleep (general anesthetic). A breathing tube will be placed in your mouth. The surgeon will make several small cuts (incisions) in your abdomen. A thin, lighted tube (laparoscope) that has a tiny camera on the end will be inserted through one of the small incisions. The camera on the laparoscope will send a picture to a TV screen (monitor) in the operating room. This will give the surgeon a good view inside your abdomen. A gas will be pumped into your abdomen. This will expand your abdomen to give the surgeon more room to perform the surgery. Other tools that are needed for the procedure will be inserted through the other incisions. The gallbladder will be removed through one of the incisions. After your gallbladder has been removed, the incisions will be closed with stitches (sutures), staples, or skin glue. Your incisions may be covered with a bandage (dressing). The procedure may vary among health care providers and hospitals. AFTER THE PROCEDURE Your blood pressure, heart rate, breathing rate, and blood oxygen level will be monitored often until the medicines you were given have worn off. You will be given medicines as needed to control your pain.   This information is not intended to replace advice given to you by your health care provider. Make sure you discuss any questions you have with your health care provider.   Document Released: 05/05/2005 Document Revised: 01/24/2015 Document Reviewed: 12/15/2012 Elsevier Interactive Patient Education 2016 Elsevier Inc.     Low-Fat Diet for Gallbladder Conditions A low-fat diet can be helpful if you have pancreatitis or a gallbladder condition. With these conditions, your pancreas and gallbladder have trouble digesting fats. A healthy eating plan with less fat will help rest your pancreas and gallbladder and reduce your symptoms. WHAT DO I NEED TO KNOW ABOUT THIS DIET? Eat a low-fat  diet. Reduce your fat intake to less than 20-30% of your total daily calories. This is less than 50-60 g of fat per day. Remember that you need some fat in your diet. Ask your dietician what your daily goal should be. Choose nonfat and low-fat healthy foods. Look for the words nonfat, low fat, or fat free. As a guide, look on the label and choose foods with less than 3 g of fat per serving. Eat only one serving. Avoid alcohol. Do not smoke. If you need help quitting, talk with your health care provider. Eat small frequent meals instead of three large heavy meals. WHAT FOODS CAN I EAT? Grains Include healthy grains and starches such as potatoes, wheat bread, fiber-rich cereal, and brown rice. Choose whole grain options whenever possible. In adults, whole grains should account for 45-65% of your daily calories.  Fruits and Vegetables Eat plenty of fruits and vegetables. Fresh fruits and vegetables add fiber to your diet. Meats and Other Protein Sources Eat lean meat such as chicken and pork. Trim any fat off of meat before cooking it. Eggs, fish, and beans are other sources of protein. In adults, these foods should account for 10-35% of your daily calories. Dairy Choose low-fat milk and dairy options. Dairy includes fat and protein, as well as calcium.  Fats and Oils Limit high-fat  foods such as fried foods, sweets, baked goods, sugary drinks.  Other Creamy sauces and condiments, such as mayonnaise, can add extra fat. Think about whether or not you need to use them, or use smaller amounts or low fat options. WHAT FOODS ARE NOT RECOMMENDED? High fat foods, such as: [employer]. Ice cream. French toast. Sweet rolls. Pizza. Cheese bread. Foods covered with batter, butter, creamy sauces, or cheese. Fried foods. Sugary drinks and desserts. Foods that cause gas or bloating   This information is not intended to replace advice given to you by your health care provider. Make sure you  discuss any questions you have with your health care provider.   Document Released: 05/10/2013 Document Reviewed: 05/10/2013 Elsevier Interactive Patient Education [employer]

## 2024-05-20 NOTE — Telephone Encounter (Signed)
"  ° °  Pre-operative Risk Assessment    Patient Name: Jeffery Sweeney  DOB: 10-26-1960 MRN: [id number]   Date of last office visit: 06/16/2023 Date of next office visit: n/a   Request for Surgical Clearance    Procedure:  Robotic Assisted Cholecystectomy   Date of Surgery:  Clearance 06/28/24                                Surgeon:  Dr. Desiderio Surgeon's Group or Practice Name:  [facility] Phone number:  [phone] Fax number:  [phone]   Type of Clearance Requested:   - Medical    Type of Anesthesia:  General    Additional requests/questions:    SignedTinnie NOVAK Schools   05/20/2024, 12:16 PM   "

## 2024-05-20 NOTE — Telephone Encounter (Signed)
 Faxed Cardia Clearance to Dr Deatrice Cage at [phone]

## 2024-05-20 NOTE — Progress Notes (Signed)
 " 05/20/2024  History of Present Illness: Jeffery Sweeney is a 64 y.o. male presenting for follow-up of acute cholecystitis.  Patient has a significant cardiac history and is currently on Plavix  and aspirin .  The patient was admitted on 04/27/2024 with acute cholecystitis.  Initially his right upper quadrant ultrasound showed cholelithiasis without evidence of acute cholecystitis.  However the next day, his symptoms worsened clinically.  HIDA scan was positive for acute cholecystitis and as such he had a percutaneous cholecystostomy tube placed with interventional radiology.  He was eventually discharged on 05/03/2024.  Patient reports today that he has been doing very well and denies any abdominal pain issues except for some discomfort at the drain insertion site.  He has been tolerating a diet and having bowel function without issues.  He completed his antibiotics.  His drain is working well he flushes it once daily as instructed.  Past Medical History: Past Medical History:  Diagnosis Date   CVA (cerebral vascular accident) (HCC) 06/2021   Small acute/subacute infarcts in the left occipital lobe and   Hyperlipidemia LDL goal <70    started on Repatha  09/26/2021, continue Crestor  40mg  daily   Hypertension    Multiple vessel coronary artery disease    Myocardial infarct (HCC)    x 3; PCI with single stent placement for each (in [place] )   NSTEMI (non-ST elevated myocardial infarction) (HCC)    LHC 2/18 with multivessel CAD and transfer to [facility] for CABG eval   STEMI (ST elevation myocardial infarction) (HCC) 09/23/2021   LHC 09/23/2021 x 2   Tobacco use    2 cigarettes daily   Unstable angina (HCC)    LHC 09/23/2021 x 2; total 8 DES to LIMA     Past Surgical History: Past Surgical History:  Procedure Laterality Date   CORONARY ANGIOPLASTY WITH STENT PLACEMENT     in [place]  in 2018-19   CORONARY ARTERY BYPASS GRAFT N/A 07/09/2020   Procedure: CORONARY ARTERY BYPASS GRAFTING (CABG)  x THREE , USING LEFT INTERNAL MAMMARY ARTERY, AND RIGHT LEG GREATER SAPHENOUS VEIN HARVESTED ENDOSCOPICALLY;  Surgeon: Army Dallas NOVAK, MD;  Location: [facility] OR;  Service: Open Heart Surgery;  Laterality: N/A;   CORONARY PRESSURE/FFR STUDY N/A 07/06/2020   Procedure: INTRAVASCULAR PRESSURE WIRE/FFR STUDY;  Surgeon: Darron Deatrice LABOR, MD;  Location: [facility] INVASIVE CV LAB;  Service: Cardiovascular;  Laterality: N/A;  LAD   CORONARY STENT INTERVENTION N/A 09/23/2021   Procedure: CORONARY STENT INTERVENTION;  Surgeon: Darron Deatrice LABOR, MD;  Location: [facility] INVASIVE CV LAB;  Service: Cardiovascular;  Laterality: N/A;   CORONARY/GRAFT ACUTE MI REVASCULARIZATION N/A 09/23/2021   Procedure: Coronary/Graft Acute MI Revascularization;  Surgeon: Darron Deatrice LABOR, MD;  Location: [facility] INVASIVE CV LAB;  Service: Cardiovascular;  Laterality: N/A;   IR PERC CHOLECYSTOSTOMY  04/29/2024   LEFT HEART CATH AND CORONARY ANGIOGRAPHY N/A 07/06/2020   Procedure: LEFT HEART CATH AND CORONARY ANGIOGRAPHY;  Surgeon: Darron Deatrice LABOR, MD;  Location: [facility] INVASIVE CV LAB;  Service: Cardiovascular;  Laterality: N/A;   LEFT HEART CATH AND CORONARY ANGIOGRAPHY N/A 09/23/2021   Procedure: LEFT HEART CATH AND CORONARY ANGIOGRAPHY;  Surgeon: Darron Deatrice LABOR, MD;  Location: [facility] INVASIVE CV LAB;  Service: Cardiovascular;  Laterality: N/A;   LEFT HEART CATH AND CORS/GRAFTS ANGIOGRAPHY N/A 09/23/2021   Procedure: LEFT HEART CATH AND CORS/GRAFTS ANGIOGRAPHY;  Surgeon: Darron Deatrice LABOR, MD;  Location: [facility] INVASIVE CV LAB;  Service: Cardiovascular;  Laterality: N/A;   TEE WITHOUT CARDIOVERSION N/A 07/09/2020  Procedure: TRANSESOPHAGEAL ECHOCARDIOGRAM (TEE);  Surgeon: Army Dallas NOVAK, MD;  Location: [facility] OR;  Service: Open Heart Surgery;  Laterality: N/A;   WRIST SURGERY Left    for cyst    Home Medications: Prior to Admission medications  Medication Sig Start Date End Date Taking? Authorizing Provider  acetaminophen  (TYLENOL ) 500 MG  tablet Take 2 tablets (1,000 mg total) by mouth every 6 (six) hours as needed. 10/15/23 10/14/24 Yes Clarine Ozell LABOR, MD  albuterol  (VENTOLIN  HFA) 108 (90 Base) MCG/ACT inhaler Inhale 2 puffs into the lungs every 6 (six) hours as needed for wheezing or shortness of breath. 06/16/23  Yes Towana Small, FNP  aspirin  81 MG chewable tablet Chew 1 tablet (81 mg total) by mouth daily. 02/23/24  Yes Towana Small, FNP  clopidogrel  (PLAVIX ) 75 MG tablet Take 1 tablet (75 mg total) by mouth daily. 02/15/24  Yes Darron Deatrice LABOR, MD  isosorbide  mononitrate (IMDUR ) 30 MG 24 hr tablet Take 2 tablets (60 mg total) by mouth 2 (two) times daily. 02/23/24 05/23/24 Yes Towana Small, FNP  meclizine  (ANTIVERT ) 25 MG tablet Take 2 tablets (50 mg total) by mouth 2 (two) times daily as needed for dizziness. 02/23/24  Yes Towana Small, FNP  metFORMIN  (GLUCOPHAGE -XR) 500 MG 24 hr tablet Take 2 tablets (1,000 mg total) by mouth 2 (two) times daily with a meal. 10/20/23  Yes Towana Small, FNP  metoprolol  tartrate (LOPRESSOR ) 50 MG tablet Take 1 tablet (50 mg total) by mouth 2 (two) times daily. 02/23/24  Yes Towana Small, FNP  nitroGLYCERIN  (NITROSTAT ) 0.4 MG SL tablet Place 1 tablet (0.4 mg total) under the tongue every 5 (five) minutes as needed for chest pain. 09/26/21  Yes Hammock, Sheri, NP  pantoprazole  (PROTONIX ) 20 MG tablet Take 2 tablets (40 mg total) by mouth daily. 02/23/24  Yes Towana Small, FNP  rosuvastatin  (CRESTOR ) 40 MG tablet Take 1 tablet by mouth once daily 02/23/24  Yes Towana Small, FNP  sodium chloride  flush 0.9 % SOLN injection 5 mLs by Intracatheter route daily. 05/02/24 07/01/24 Yes Leam Madero, Aloysius, MD  sodium chloride  flush 0.9 % SOLN injection 5 mLs by Intracatheter route daily. Flush cholecystostomy tube daily with 5 ccs NS 05/02/24 07/01/24 Yes Schulz, Zachary R, PA-C  traZODone  (DESYREL ) 50 MG tablet Take 0.5-1 tablets (25-50 mg total) by mouth at bedtime as needed for sleep. 02/23/24   Yes Towana Small, FNP    Allergies: Allergies[1]  Review of Systems: Review of Systems  Constitutional:  Negative for chills and fever.  HENT:  Negative for hearing loss.   Respiratory:  Negative for shortness of breath.   Cardiovascular:  Negative for chest pain.  Gastrointestinal:  Negative for abdominal pain, nausea and vomiting.  Genitourinary:  Negative for dysuria.  Musculoskeletal:  Negative for myalgias.  Skin:  Negative for rash.  Neurological:  Negative for dizziness.  Psychiatric/Behavioral:  Negative for depression.     Physical Exam BP 135/87   Pulse 86   Ht 5' 3 (1.6 m)   Wt 179 lb (81.2 kg)   SpO2 97%   BMI 31.71 kg/m  CONSTITUTIONAL: No acute distress HEENT:  Normocephalic, atraumatic, extraocular motion intact. RESPIRATORY:  Lungs are clear, and breath sounds are equal bilaterally. Normal respiratory effort without pathologic use of accessory muscles. CARDIOVASCULAR: Heart is regular without murmurs, gallops, or rubs. GI: The abdomen is soft, nondistended, nontender to palpation.  Right upper quadrant drain in place and secured without any issues or evidence of infection.  There is bilious  fluid in the drain bag.  NEUROLOGIC:  Motor and sensation is grossly normal.  Cranial nerves are grossly intact. PSYCH:  Alert and oriented to person, place and time. Affect is normal.  Labs/Imaging: Ultrasound RUQ on 04/27/2024: IMPRESSION: 1. Cholelithiasis without sonographic evidence of acute cholecystitis. 2. Borderline dilated common bile duct measuring 8 mm, unchanged from prior CT. Distal duct not visualized due to overlying bowel gas. 3. Diffuse hepatic steatosis.  HIDA scan on 04/28/2024: IMPRESSION: 1. Non-filling of the gallbladder after morphine  augmentation concerning for cystic duct obstruction / acute cholecystitis. 2. Patent common bile duct . 3. these results will be called to the ordering clinician or representative by the radiologist  assistant, and communication documented in the pacs or clario dashboard  Assessment and Plan: This is a 64 y.o. male with acute cholecystitis, status post percutaneous cholecystostomy drain placement.  - Discussed with patient again the rationale for proceeding with a percutaneous drain given that he was on aspirin  and Plavix .  Now with the drain in place, we are waiting for the inflammation and scarring to subside and soft and before proceeding with surgery.  This also gives us  time to send for medical and cardiology clearance as we would need his Plavix  at least to be withheld prior to surgery.  If that is not possible, then he might require other avenues for management of his cholelithiasis via interventional radiology.  However otherwise our plan currently would be for interval robotic cholecystectomy which will be in about 2 months from his drain placement.  He is in agreement with this. - Discussed with him then the plan for a robotic assisted cholecystectomy and reviewed with him the surgery at length including the planned incisions, risks of bleeding, infection, injury to surrounding structures, the use of ICG to better evaluate the biliary anatomy, that this would be an outpatient procedure, postoperative activity restrictions, pain control, and he is willing to proceed. - Discussed with patient that interventional radiology will be in touch with him also to schedule him for a drain exchange and cholangiogram. - Patient is in agreement with this plan all of his questions have been answered. - He will be scheduled for surgery in 06/28/2024.  He will follow-up with me in 1 month for H&P update.  I spent 40 minutes dedicated to the care of this patient on the date of this encounter to include pre-visit review of records, face-to-face time with the patient discussing diagnosis and management, and any post-visit coordination of care.   Aloysius Sheree Plant, MD [facility]         [1] No Known Allergies  "

## 2024-05-20 NOTE — Telephone Encounter (Signed)
" ° °  Name: Jeffery Sweeney  DOB: 01/09/1961  MRN: [id number]  Primary Cardiologist: Deatrice Cage, MD  Chart reviewed as part of pre-operative protocol coverage. Because of Ajmal Kathan Supak's past medical history and time since last visit, he will require a follow-up in-office visit in order to better assess preoperative cardiovascular risk.  Pre-op covering staff: - Please schedule appointment and call patient to inform them. If patient already had an upcoming appointment within acceptable timeframe, please add pre-op clearance to the appointment notes so provider is aware. - Please contact requesting surgeon's office via preferred method (i.e, phone, fax) to inform them of need for appointment prior to surgery.  Redell Bhandari D Lillyann Ahart, NP  05/20/2024, 1:10 PM   "

## 2024-05-24 ENCOUNTER — Inpatient Hospital Stay
Admission: EM | Admit: 2024-05-24 | Discharge: 2024-06-03 | DRG: 444 | Disposition: A | Discharge status: Home Health | Payer: MEDICAID | Attending: Internal Medicine | Admitting: Internal Medicine

## 2024-05-24 ENCOUNTER — Ambulatory Visit: Payer: Self-pay

## 2024-05-24 ENCOUNTER — Telehealth: Payer: Self-pay | Admitting: Surgery

## 2024-05-24 ENCOUNTER — Other Ambulatory Visit: Payer: Self-pay | Admitting: *Deleted

## 2024-05-24 ENCOUNTER — Emergency Department: Payer: MEDICAID

## 2024-05-24 ENCOUNTER — Other Ambulatory Visit: Payer: Self-pay

## 2024-05-24 ENCOUNTER — Emergency Department: Payer: Self-pay

## 2024-05-24 ENCOUNTER — Encounter: Payer: Self-pay | Admitting: Emergency Medicine

## 2024-05-24 DIAGNOSIS — E119 Type 2 diabetes mellitus without complications: Secondary | ICD-10-CM | POA: Diagnosis present

## 2024-05-24 DIAGNOSIS — Z833 Family history of diabetes mellitus: Secondary | ICD-10-CM

## 2024-05-24 DIAGNOSIS — Z5919 Other inadequate housing: Secondary | ICD-10-CM

## 2024-05-24 DIAGNOSIS — Z955 Presence of coronary angioplasty implant and graft: Secondary | ICD-10-CM

## 2024-05-24 DIAGNOSIS — Z7982 Long term (current) use of aspirin: Secondary | ICD-10-CM

## 2024-05-24 DIAGNOSIS — K859 Acute pancreatitis without necrosis or infection, unspecified: Principal | ICD-10-CM

## 2024-05-24 DIAGNOSIS — T80818A Extravasation of other vesicant agent, initial encounter: Secondary | ICD-10-CM | POA: Diagnosis not present

## 2024-05-24 DIAGNOSIS — I2510 Atherosclerotic heart disease of native coronary artery without angina pectoris: Secondary | ICD-10-CM | POA: Diagnosis present

## 2024-05-24 DIAGNOSIS — K8062 Calculus of gallbladder and bile duct with acute cholecystitis without obstruction: Secondary | ICD-10-CM | POA: Diagnosis present

## 2024-05-24 DIAGNOSIS — J9601 Acute respiratory failure with hypoxia: Secondary | ICD-10-CM | POA: Diagnosis not present

## 2024-05-24 DIAGNOSIS — I10 Essential (primary) hypertension: Secondary | ICD-10-CM | POA: Diagnosis present

## 2024-05-24 DIAGNOSIS — I252 Old myocardial infarction: Secondary | ICD-10-CM

## 2024-05-24 DIAGNOSIS — B888 Other specified infestations: Secondary | ICD-10-CM | POA: Diagnosis present

## 2024-05-24 DIAGNOSIS — Z5941 Food insecurity: Secondary | ICD-10-CM

## 2024-05-24 DIAGNOSIS — B961 Klebsiella pneumoniae [K. pneumoniae] as the cause of diseases classified elsewhere: Secondary | ICD-10-CM | POA: Diagnosis present

## 2024-05-24 DIAGNOSIS — R531 Weakness: Secondary | ICD-10-CM

## 2024-05-24 DIAGNOSIS — Z6831 Body mass index (BMI) 31.0-31.9, adult: Secondary | ICD-10-CM

## 2024-05-24 DIAGNOSIS — Z8673 Personal history of transient ischemic attack (TIA), and cerebral infarction without residual deficits: Secondary | ICD-10-CM

## 2024-05-24 DIAGNOSIS — F17210 Nicotine dependence, cigarettes, uncomplicated: Secondary | ICD-10-CM | POA: Diagnosis present

## 2024-05-24 DIAGNOSIS — K8309 Other cholangitis: Principal | ICD-10-CM | POA: Diagnosis present

## 2024-05-24 DIAGNOSIS — Z951 Presence of aortocoronary bypass graft: Secondary | ICD-10-CM

## 2024-05-24 DIAGNOSIS — R9431 Abnormal electrocardiogram [ECG] [EKG]: Secondary | ICD-10-CM | POA: Diagnosis present

## 2024-05-24 DIAGNOSIS — Y848 Other medical procedures as the cause of abnormal reaction of the patient, or of later complication, without mention of misadventure at the time of the procedure: Secondary | ICD-10-CM | POA: Diagnosis not present

## 2024-05-24 DIAGNOSIS — R7881 Bacteremia: Secondary | ICD-10-CM | POA: Diagnosis present

## 2024-05-24 DIAGNOSIS — F5101 Primary insomnia: Secondary | ICD-10-CM

## 2024-05-24 DIAGNOSIS — R42 Dizziness and giddiness: Secondary | ICD-10-CM

## 2024-05-24 DIAGNOSIS — K219 Gastro-esophageal reflux disease without esophagitis: Secondary | ICD-10-CM

## 2024-05-24 DIAGNOSIS — B9689 Other specified bacterial agents as the cause of diseases classified elsewhere: Secondary | ICD-10-CM | POA: Diagnosis present

## 2024-05-24 DIAGNOSIS — E785 Hyperlipidemia, unspecified: Secondary | ICD-10-CM | POA: Diagnosis present

## 2024-05-24 DIAGNOSIS — E8721 Acute metabolic acidosis: Secondary | ICD-10-CM | POA: Diagnosis not present

## 2024-05-24 DIAGNOSIS — Z7902 Long term (current) use of antithrombotics/antiplatelets: Secondary | ICD-10-CM

## 2024-05-24 DIAGNOSIS — Z7984 Long term (current) use of oral hypoglycemic drugs: Secondary | ICD-10-CM

## 2024-05-24 DIAGNOSIS — Z9889 Other specified postprocedural states: Secondary | ICD-10-CM

## 2024-05-24 DIAGNOSIS — K8050 Calculus of bile duct without cholangitis or cholecystitis without obstruction: Secondary | ICD-10-CM | POA: Diagnosis present

## 2024-05-24 DIAGNOSIS — R7401 Elevation of levels of liver transaminase levels: Secondary | ICD-10-CM

## 2024-05-24 DIAGNOSIS — K851 Biliary acute pancreatitis without necrosis or infection: Principal | ICD-10-CM | POA: Diagnosis present

## 2024-05-24 DIAGNOSIS — I214 Non-ST elevation (NSTEMI) myocardial infarction: Secondary | ICD-10-CM

## 2024-05-24 DIAGNOSIS — E66811 Obesity, class 1: Secondary | ICD-10-CM | POA: Diagnosis present

## 2024-05-24 DIAGNOSIS — Z79899 Other long term (current) drug therapy: Secondary | ICD-10-CM

## 2024-05-24 LAB — COMPREHENSIVE METABOLIC PANEL WITH GFR
ALT: 201 U/L — ABNORMAL HIGH (ref 0–44)
AST: 504 U/L — ABNORMAL HIGH (ref 15–41)
Albumin: 3.7 g/dL (ref 3.5–5.0)
Alkaline Phosphatase: 130 U/L — ABNORMAL HIGH (ref 38–126)
Anion gap: 13 (ref 5–15)
BUN: 6 mg/dL — ABNORMAL LOW (ref 8–23)
CO2: 22 mmol/L (ref 22–32)
Calcium: 8.7 mg/dL — ABNORMAL LOW (ref 8.9–10.3)
Chloride: 101 mmol/L (ref 98–111)
Creatinine, Ser: 0.86 mg/dL (ref 0.61–1.24)
GFR, Estimated: >60 mL/min
Glucose, Bld: 113 mg/dL — ABNORMAL HIGH (ref 70–99)
Potassium: 3.7 mmol/L (ref 3.5–5.1)
Sodium: 135 mmol/L (ref 135–145)
Total Bilirubin: 1.7 mg/dL — ABNORMAL HIGH (ref 0.0–1.2)
Total Protein: 7.1 g/dL (ref 6.5–8.1)

## 2024-05-24 LAB — CBC
HCT: 41.6 % (ref 39.0–52.0)
Hemoglobin: 13.7 g/dL (ref 13.0–17.0)
MCH: 29 pg (ref 26.0–34.0)
MCHC: 32.9 g/dL (ref 30.0–36.0)
MCV: 88.1 fL (ref 80.0–100.0)
Platelets: 257 K/uL (ref 150–400)
RBC: 4.72 MIL/uL (ref 4.22–5.81)
RDW: 12.9 % (ref 11.5–15.5)
WBC: 9.9 K/uL (ref 4.0–10.5)
nRBC: 0 % (ref 0.0–0.2)

## 2024-05-24 LAB — TROPONIN T, HIGH SENSITIVITY: Troponin T High Sensitivity: <15 ng/L (ref 0–19)

## 2024-05-24 LAB — LIPASE, BLOOD: Lipase: 1518 U/L — ABNORMAL HIGH (ref 11–51)

## 2024-05-24 MED ORDER — IOHEXOL 300 MG/ML  SOLN
100.0000 mL | Freq: Once | INTRAMUSCULAR | Status: AC | PRN
  Administered 2024-05-24: 100 mL via INTRAVENOUS

## 2024-05-24 MED ORDER — SODIUM CHLORIDE 0.9 % IV BOLUS
1000.0000 mL | Freq: Once | INTRAVENOUS | Status: AC
  Administered 2024-05-24: 1000 mL via INTRAVENOUS

## 2024-05-24 MED ORDER — MORPHINE SULFATE (PF) 4 MG/ML IV SOLN
6.0000 mg | Freq: Once | INTRAVENOUS | Status: AC
  Administered 2024-05-24: 6 mg via INTRAVENOUS
  Filled 2024-05-24: qty 2

## 2024-05-24 NOTE — ED Provider Notes (Signed)
----------------------------------------- °  11:19 PM on 05/24/2024 -----------------------------------------  Blood pressure 136/75, pulse 86, temperature 98 F (36.7 C), temperature source Oral, resp. rate 18, height 5' 3 (1.6 m), weight 81 kg, SpO2 97%.  Assuming care from Dr. Dicky.  In short, Jeffery Sweeney is a 64 y.o. male with a chief complaint of abdominal pain.  Refer to the original H&P for additional details.  The current plan of care is to follow-up CT abdomen/pelvis, anticipate admission for abnormal LFTs.  ----------------------------------------- 1:12 AM on 05/25/2024 ----------------------------------------- CT abdomen/pelvis shows appropriate positioning of cholecystostomy drain and no other acute finding with no radiopaque stones or CBD dilation.  MRCP was ordered and case discussed with hospitalist for admission.  Medications  LORazepam  (ATIVAN ) injection 1 mg (has no administration in time range)  morphine  (PF) 4 MG/ML injection 6 mg (6 mg Intravenous Given 05/24/24 2104)  sodium chloride  0.9 % bolus 1,000 mL (1,000 mLs Intravenous New Bag/Given 05/24/24 2105)  iohexol  (OMNIPAQUE ) 300 MG/ML solution 100 mL (100 mLs Intravenous Contrast Given 05/24/24 2325)     ED Discharge Orders     None      Final diagnoses:  Acute pancreatitis, unspecified complication status, unspecified pancreatitis type  Transaminitis      Willo Dunnings, MD 05/25/24 [id number]

## 2024-05-24 NOTE — ED Triage Notes (Signed)
 Pt endorses upper abd pain all today. Denies n/v/d.

## 2024-05-24 NOTE — Transitions of Care (Post Inpatient/ED Visit) (Signed)
 " Transition of Care week 4/ day # 20  Visit Note  05/24/2024  Name: Jeffery Sweeney MRN: [id number]          DOB: [birth year]  Situation: Patient enrolled in [facility] 30-day program. Visit completed with patient by telephone.   HIPAA identifiers x 2 verified  Background:  Recent hospitalization December 10- 16, 2025 for (R) upper quadrant abdominal pain/ biliary colic with percutaneous drain placement Independent; drives self; does not use assistive devices at baseline (1) unplanned hospital admission x last (6)/ (12) months  Initial Transition Care Management Follow-up Telephone Call Discharge Date and Diagnosis: 05/03/24, biliary colic   Past Medical History:  Diagnosis Date   CVA (cerebral vascular accident) (HCC) 06/2021   Small acute/subacute infarcts in the left occipital lobe and   Hyperlipidemia LDL goal <70    started on Repatha  09/26/2021, continue Crestor  40mg  daily   Hypertension    Multiple vessel coronary artery disease    Myocardial infarct (HCC)    x 3; PCI with single stent placement for each (in [place] )   NSTEMI (non-ST elevated myocardial infarction) (HCC)    LHC 2/18 with multivessel CAD and transfer to [facility] for CABG eval   STEMI (ST elevation myocardial infarction) (HCC) 09/23/2021   LHC 09/23/2021 x 2   Tobacco use    2 cigarettes daily   Unstable angina (HCC)    LHC 09/23/2021 x 2; total 8 DES to LIMA   Assessment:  I am having new and different pain in my stomach, over the last few days- it wasn't there when I saw the surgeon last week.  It is in the middle of my belly, just under my rib cage and never goes away- it is different from the pain I have been having with the drain- I am able to eat and had a normal BM this morning, but the constant pain worries me, like something is going on around this surgery I need-- I just hope something new isn't happening; I am still managing the drain without any problems, I empty it myself and keep the skin clean- the  surgeon said it looked fine when I saw him, last week; antibiotics have been done for awhile- I took them all the way they told me to Denies clinical concerns other than reported abdominal pain and sounds to be in no distress throughout [facility] 30-day program outreach call today confirms able to still eat/ drink; confirms normal BM this morning; Scheduled acute PCP office visit with PCP covering provider for tomorrow 05/25/24   Patient Reported Symptoms: Cognitive Cognitive Status: Normal speech and language skills, Alert and oriented to person, place, and time, Insightful and able to interpret abstract concepts Cognitive/Intellectual Conditions Management [RPT]: None reported or documented in medical history or problem list      Neurological Neurological Review of Symptoms: No symptoms reported    HEENT HEENT Symptoms Reported: No symptoms reported      Cardiovascular Cardiovascular Symptoms Reported: No symptoms reported Does patient have uncontrolled Hypertension?: No Cardiovascular Management Strategies: Routine screening, Medication therapy, Coping strategies, Adequate rest  Respiratory Respiratory Symptoms Reported: No symptoms reported Other Respiratory Symptoms: Denies shortness of breath; sounds to be in no respiratory distress throughout [facility] call    Endocrine Endocrine Symptoms Reported: No symptoms reported Is patient diabetic?: Yes Is patient checking blood sugars at home?: Yes List most recent blood sugar readings, include date and time of day: Last time I checked- it was in the 100's; I haven't been  checking them every day- Evalene told me I don't need to do that- I haven't checked over the last few days because I have this stomach pain    Gastrointestinal Gastrointestinal Symptoms Reported: Abdominal pain or discomfort Other Gastrointestinal Symptoms: Reports new abdominal pain in mid- abdomen just started a couple of days ago- it never goes away- it is different from the  pain I have been having with the drain- it wasn't there when I saw the surgeon last week.  I just hope something new isn't happening; confirms able to still eat/ drink; confirms normal BM this morning;  Scheduled acute PCP office visit with PCP covering provider Gastrointestinal Management Strategies: Adequate rest, Coping strategies    Genitourinary Genitourinary Symptoms Reported: No symptoms reported    Integumentary Integumentary Symptoms Reported: Other Other Integumentary Symptoms: Reports ongoing independent management of JP drain post- recent hospitalization: it is not draining as much, and it's a yellow-ish color;  the surgeon said he was happy with it and plans to keep the drain in for awhile longer; confirmed skin around drain insertion site looks fine, no redness or swelling Skin Management Strategies: Routine screening, Dressing changes, Coping strategies  Musculoskeletal Musculoskelatal Symptoms Reviewed: No symptoms reported        Psychosocial Psychosocial Symptoms Reported: No symptoms reported         There were no vitals filed for this visit. Pain Scale: 0-10 Pain Score: 7  Pain Type: Acute pain Pain Location: Abdomen Pain Orientation: Medial, Other (Comment) (Middle of my belly under my rib cage- it just started this week since I saw the surgeon last week- it is a new and different pain) Pain Descriptors / Indicators: Constant, Dull, Discomfort Pain Onset: On-going Patients Stated Pain Goal: 0 Pain Intervention(s): Repositioned, Rest, Relaxation Multiple Pain Sites: No  Medications Reviewed Today     Reviewed by Sartaj Hoskin M, RN (Registered Nurse) on 05/24/24 at 1400  Med List Status: <None>   Medication Order Taking? Sig Documenting Provider Last Dose Status Informant  acetaminophen  (TYLENOL ) 500 MG tablet [id number]  Take 2 tablets (1,000 mg total) by mouth every 6 (six) hours as needed. Clarine Ozell LABOR, MD  Active Self  albuterol  (VENTOLIN  HFA)  108 (90 Base) MCG/ACT inhaler [id number]  Inhale 2 puffs into the lungs every 6 (six) hours as needed for wheezing or shortness of breath. Towana Evalene, FNP  Active Self  aspirin  81 MG chewable tablet [id number]  Chew 1 tablet (81 mg total) by mouth daily. Towana Evalene, FNP  Active Self  clopidogrel  (PLAVIX ) 75 MG tablet [id number]  Take 1 tablet (75 mg total) by mouth daily. Darron Deatrice LABOR, MD  Active Self  isosorbide  mononitrate (IMDUR ) 30 MG 24 hr tablet [id number]  Take 2 tablets (60 mg total) by mouth 2 (two) times daily. Towana Evalene, FNP  Expired 05/23/24 2359 Self  meclizine  (ANTIVERT ) 25 MG tablet [id number]  Take 2 tablets (50 mg total) by mouth 2 (two) times daily as needed for dizziness. Towana Evalene, FNP  Active Self  metFORMIN  (GLUCOPHAGE -XR) 500 MG 24 hr tablet [id number]  Take 2 tablets (1,000 mg total) by mouth 2 (two) times daily with a meal. Towana Evalene, FNP  Active Self  metoprolol  tartrate (LOPRESSOR ) 50 MG tablet [id number]  Take 1 tablet (50 mg total) by mouth 2 (two) times daily. Towana Evalene, FNP  Active Self  nitroGLYCERIN  (NITROSTAT ) 0.4 MG SL tablet [id number]  Place 1 tablet (0.4 mg total) under the tongue every 5 (five) minutes as  needed for chest pain. Gerard Frederick, NP  Active Self  pantoprazole  (PROTONIX ) 20 MG tablet [id number]  Take 2 tablets (40 mg total) by mouth daily. Towana Small, FNP  Active Self  rosuvastatin  (CRESTOR ) 40 MG tablet [id number]  Take 1 tablet by mouth once daily Towana Small, FNP  Active Self  sodium chloride  flush 0.9 % SOLN injection [id number]  5 mLs by Intracatheter route daily. Desiderio Schanz, MD  Active   sodium chloride  flush 0.9 % SOLN injection [id number]  5 mLs by Intracatheter route daily. Flush cholecystostomy tube daily with 5 ccs NS Schulz, Zachary R, PA-C  Active   traZODone  (DESYREL ) 50 MG tablet [id number]  Take 0.5-1 tablets (25-50 mg total) by mouth at bedtime as needed for sleep. Towana Small, FNP   Active Self           Recommendation:   Acute PCP follow-up: as scheduled today with PCP practice- covering provider for tomorrow 05/25/24- new onset abdominal pain Specialty provider follow-up: 01/08/ 26- cardiology provider-- as scheduled Continue Current Plan of Care  Follow Up Plan:   Telephone follow-up in 1 week- as scheduled 06/01/24  I appreciate the opportunity to participate in Vyncent's care,  Pls call/ message for questions,  Beatris Blinda Lawrence, RN, BSN, CCRN Alumnus RN Care Manager  Transitions of Care  [facility] [phone]: direct office     "

## 2024-05-24 NOTE — Patient Instructions (Signed)
 Visit Information  Thank you for taking time to visit with me today. Please don't hesitate to contact me if I can be of assistance to you before our next scheduled telephone appointment.  Our next appointment is by telephone on Wednesday June 01, 2024 at 2:00 pm  Please call the care guide team at [phone] if you need to cancel or reschedule your appointment.   Following are the goals we discussed today:  Patient Self Care Activities:  Attend all scheduled provider appointments Call pharmacy for medication refills 3-7 days in advance of running out of medications Call provider office for new concerns or questions  Participate in Transition of Care Program/Attend TOC scheduled calls Take medications as prescribed   Empty biliary drain and measure as you have been, as instructed after your recent hospital visit Monitor pain and take pain medication as per ordered Monitor for any redness around tube site Continue monitoring and recording your blood sugars at home If you believe your condition is getting worse- contact your care providers (doctors) promptly- reaching out to your doctor early when you have concerns can prevent you from having to go to the hospital  If you are experiencing a Mental Health or Behavioral Health Crisis or need someone to talk to, please  call the Suicide and Crisis Lifeline: 988 call the [place]  National Suicide Prevention Lifeline: (562) 317-6326 or TTY: (986)424-5459 TTY (854)156-3065) to talk to a trained counselor call 1-800-273-TALK (toll free, 24 hour hotline) go to [facility] [address] [place] [phone]) call the [facility] Crisis Line: [phone] call 911   Care plan and visit instructions communicated with the patient verbally today. The patient DECLINED to receive copy of care plan and patient instructions in any format.   Paiton Boultinghouse Mckinney Bernis Schreur, RN, BSN, Media Planner   Transitions of Care  [facility] [phone]: direct office

## 2024-05-24 NOTE — Telephone Encounter (Signed)
 FYI Only or Action Required?: FYI only for provider: ED advised.  Patient was last seen in primary care on 05/17/2024 by Towana Small, FNP.  Called Nurse Triage reporting Abdominal Pain.8/10  Symptoms began today.  Interventions attempted: Other: Burping and pain medication.  Symptoms are: gradually worsening.  Triage Disposition: Go to ED Now (Notify PCP)  Patient/caregiver understands and will follow disposition?: Yes                 Copied from CRM [id number]. Topic: Clinical - Red Word Triage >> May 24, 2024  5:46 PM Delon DASEN wrote: Red Word that prompted transfer to Nurse Triage: abdominal pain in middle, level 8 pain Reason for Disposition  [1] SEVERE pain AND [2] age > 60 years  Answer Assessment - Initial Assessment Questions 1. LOCATION: Where does it hurt?      Middle lower 2. RADIATION: Does the pain shoot anywhere else? (e.g., chest, back)     no 3. ONSET: When did the pain begin? (Minutes, hours or days ago)      10 am 4. SUDDEN: Gradual or sudden onset?     Fairly fast onset 5. PATTERN Does the pain come and go, or is it constant?     constant 6. SEVERITY: How bad is the pain?  (e.g., Scale 1-10; mild, moderate, or severe)     8/10 - took pain pill has not helped 8. CAUSE: What do you think is causing the stomach pain? (e.g., gallstones, recent abdominal surgery)     Feels like he has to burp 9. RELIEVING/AGGRAVATING FACTORS: What makes it better or worse? (e.g., antacids, bending or twisting motion, bowel movement)     nothing 10. OTHER SYMPTOMS: Do you have any other symptoms? (e.g., back pain, diarrhea, fever, urination pain, vomiting)       Has a gallbladder bag.  Protocols used: Abdominal Pain - Male-A-AH

## 2024-05-24 NOTE — ED Provider Notes (Signed)
 "  [facility] Provider Note    Event Date/Time   First MD Initiated Contact with Patient 05/24/24 1903     (approximate)   History   Abdominal Pain   HPI  Jeffery Sweeney is a 64 y.o. male history of coronary disease prior stroke coronary disease Reviewed external records from from May 03, 2024 patient was admitted for biliary colic, percutaneous cholecystostomy placed on IV Zosyn  discharged on Augmentin   This afternoon been having pain in the mid upper abdomen.  No fevers or chills.  No nausea or vomiting but reports that the pain is hard to describe deep in the middle of his upper abdomen.  He has never had pain like this before.  He has not had a fever  Past Medical History:  Diagnosis Date   CVA (cerebral vascular accident) (HCC) 06/2021   Small acute/subacute infarcts in the left occipital lobe and   Hyperlipidemia LDL goal <70    started on Repatha  09/26/2021, continue Crestor  40mg  daily   Hypertension    Multiple vessel coronary artery disease    Myocardial infarct (HCC)    x 3; PCI with single stent placement for each (in [place] )   NSTEMI (non-ST elevated myocardial infarction) (HCC)    LHC 2/18 with multivessel CAD and transfer to [facility] for CABG eval   STEMI (ST elevation myocardial infarction) (HCC) 09/23/2021   LHC 09/23/2021 x 2   Tobacco use    2 cigarettes daily   Unstable angina (HCC)    LHC 09/23/2021 x 2; total 8 DES to LIMA     Physical Exam   Triage Vital Signs: ED Triage Vitals  Encounter Vitals Group     BP 05/24/24 1834 133/83     Girls Systolic BP Percentile --      Girls Diastolic BP Percentile --      Boys Systolic BP Percentile --      Boys Diastolic BP Percentile --      Pulse Rate 05/24/24 1834 87     Resp 05/24/24 1834 18     Temp 05/24/24 1834 (!) 97.5 F (36.4 C)     Temp Source 05/24/24 1834 Oral     SpO2 05/24/24 1834 96 %     Weight 05/24/24 1833 178 lb 9.2 oz (81 kg)     Height 05/24/24 1833  5' 3 (1.6 m)     Head Circumference --      Peak Flow --      Pain Score 05/24/24 1832 8     Pain Loc --      Pain Education --      Exclude from Growth Chart --     Most recent vital signs: Vitals:   05/24/24 1834 05/24/24 2110  BP: 133/83 136/75  Pulse: 87 86  Resp: 18 18  Temp: (!) 97.5 F (36.4 C) 98 F (36.7 C)  SpO2: 96% 97%     General: Awake, no distress.  Appears in discomfort though reporting pain in his epigastric region CV:   Good peripheral perfusion. Normal rate and heart tones. Resp:   Normal effort. Lung sounds clear bilateral. Speaking without distress. Abd:   No distention. Soft, non-tender to palpation in all quadrants with exception of the epigastrium where he is exquisitely tender. No rebound or guarding. Neuro:   No focal neuro deficits noted. Moves extremities well without noted concern. Other:     ED Results / Procedures / Treatments   Labs (all labs ordered  are listed, but only abnormal results are displayed) Labs Reviewed  LIPASE, BLOOD - Abnormal; Notable for the following components:      Result Value   Lipase 1,518 (*)    All other components within normal limits  COMPREHENSIVE METABOLIC PANEL WITH GFR - Abnormal; Notable for the following components:   Glucose, Bld 113 (*)    BUN 6 (*)    Calcium  8.7 (*)    AST 504 (*)    ALT 201 (*)    Alkaline Phosphatase 130 (*)    Total Bilirubin 1.7 (*)    All other components within normal limits  CBC  URINALYSIS, ROUTINE W REFLEX MICROSCOPIC  TROPONIN T, HIGH SENSITIVITY     EKG  I independently reviewed the EKG and interpret at 1840 heart rate 85 QRS 85 QTc 420, normal sinus rhythm no evidence acute ischemia   RADIOLOGY CT abdomen pelvis pending at time of signout to Dr. Willo will follow-up    PROCEDURES:  Critical Care performed: No  Procedures   MEDICATIONS ORDERED IN ED: Medications  morphine  (PF) 4 MG/ML injection 6 mg (6 mg Intravenous Given 05/24/24 2104)  sodium  chloride 0.9 % bolus 1,000 mL (1,000 mLs Intravenous New Bag/Given 05/24/24 2105)  iohexol  (OMNIPAQUE ) 300 MG/ML solution 100 mL (100 mLs Intravenous Contrast Given 05/24/24 2325)     IMPRESSION / MDM / ASSESSMENT AND PLAN / ED COURSE  I reviewed the triage vital signs and the nursing notes.                              Based on presentation, the differential diagnosis includes, but is not limited to key considerations: abdominal pain DIFFERENTIAL DIAGNOSIS CONSIDERATIONS: High-acuity etiologies considered and prioritized for rule-out: - Abdominal Aortic Aneurysm (AAA) Rupture/Leak - Mesenteric Ischemia - Perforated Viscus (Hollow Organ Perforation) - Acute Appendicitis - Testicular Torsion (if male) - Bowel Obstruction (Strangulated) - Ascending Cholangitis - Acute Pancreatitis - Cholecystitis - Diverticulitis    Patient's labs and clinical history indicate acute pancreatitis with his recent history I am also highly suspicious for acute choledocholithiasis or possible other associated acute hepatobiliary pathology associated.  He will need CT imaging to further evaluate.  LFTs lipase are quite elevated.  Minimal elevation in his bilirubin.  No evidence of cardiac cause.  Thankfully no white count no fever and he does not have any symptoms highly suggestive of cholangitis at this time.  Awaiting CT result    Patient's presentation is most consistent with acute complicated illness / injury requiring diagnostic workup.    The patient is on the cardiac monitor to evaluate for evidence of arrhythmia and/or significant heart rate changes.  Clinical Course as of 05/25/24 0002  Tue May 24, 2024  2231 Awaiting CT scan [MQ]  2240 Confirmed via call schedule with Dr. Jinny is on-call tomorrow [in the event the patient needs ERCP] [MQ]  2259 Patient is currently resting comfortably with stable vital signs in no distress.  Awaiting CT [MQ]  2347 Ongoing care signed Dr. Willo.  Currently  awaiting CT imaging.  Thereafter anticipate patient will require admission.  At this point high concern for acute hepatobiliary process choledocholithiasis etc. [MQ]    Clinical Course User Index [MQ] Dicky Anes, MD     FINAL CLINICAL IMPRESSION(S) / ED DIAGNOSES   Final diagnoses:  Acute pancreatitis, unspecified complication status, unspecified pancreatitis type  Transaminitis     Rx / DC Orders  ED Discharge Orders     None        Note:  This document was prepared using Dragon voice recognition software and may include unintentional dictation errors.   Dicky Anes, MD 05/25/24 0002  "

## 2024-05-24 NOTE — Telephone Encounter (Signed)
 Patient has been advised of Pre-Admission date/time, and Surgery date at [facility].  Surgery Date: 06/28/24 Preadmission Testing Date: Preadmissions to call patient  Patient informed of the scheduling process and surgery information given at time of office visit.  Patient has been made aware to call [phone], between 1-3:00pm the day before surgery, to find out what time to arrive for surgery.

## 2024-05-25 ENCOUNTER — Encounter: Payer: Self-pay | Admitting: Internal Medicine

## 2024-05-25 ENCOUNTER — Observation Stay: Payer: MEDICAID | Admitting: General Practice

## 2024-05-25 ENCOUNTER — Ambulatory Visit: Payer: Self-pay | Admitting: Family Medicine

## 2024-05-25 ENCOUNTER — Observation Stay: Payer: MEDICAID

## 2024-05-25 ENCOUNTER — Encounter: Admission: EM | Disposition: A | Payer: Self-pay | Source: Home / Self Care | Attending: Internal Medicine

## 2024-05-25 ENCOUNTER — Observation Stay: Payer: Self-pay

## 2024-05-25 DIAGNOSIS — Z8673 Personal history of transient ischemic attack (TIA), and cerebral infarction without residual deficits: Secondary | ICD-10-CM

## 2024-05-25 DIAGNOSIS — Z9889 Other specified postprocedural states: Secondary | ICD-10-CM

## 2024-05-25 DIAGNOSIS — K851 Biliary acute pancreatitis without necrosis or infection: Secondary | ICD-10-CM | POA: Diagnosis present

## 2024-05-25 HISTORY — PX: ERCP: SHX5425

## 2024-05-25 LAB — URINALYSIS, ROUTINE W REFLEX MICROSCOPIC
Bilirubin Urine: NEGATIVE
Glucose, UA: NEGATIVE mg/dL
Hgb urine dipstick: NEGATIVE
Ketones, ur: NEGATIVE mg/dL
Leukocytes,Ua: NEGATIVE
Nitrite: NEGATIVE
Protein, ur: NEGATIVE mg/dL
Specific Gravity, Urine: 1.045 — ABNORMAL HIGH (ref 1.005–1.030)
pH: 6 (ref 5.0–8.0)

## 2024-05-25 LAB — COMPREHENSIVE METABOLIC PANEL WITH GFR
ALT: 219 U/L — ABNORMAL HIGH (ref 0–44)
AST: 304 U/L — ABNORMAL HIGH (ref 15–41)
Albumin: 3.3 g/dL — ABNORMAL LOW (ref 3.5–5.0)
Alkaline Phosphatase: 129 U/L — ABNORMAL HIGH (ref 38–126)
Anion gap: 10 (ref 5–15)
BUN: <5 mg/dL — ABNORMAL LOW (ref 8–23)
CO2: 24 mmol/L (ref 22–32)
Calcium: 8.2 mg/dL — ABNORMAL LOW (ref 8.9–10.3)
Chloride: 104 mmol/L (ref 98–111)
Creatinine, Ser: 0.8 mg/dL (ref 0.61–1.24)
GFR, Estimated: >60 mL/min
Glucose, Bld: 99 mg/dL (ref 70–99)
Potassium: 4 mmol/L (ref 3.5–5.1)
Sodium: 139 mmol/L (ref 135–145)
Total Bilirubin: 1.6 mg/dL — ABNORMAL HIGH (ref 0.0–1.2)
Total Protein: 6.4 g/dL — ABNORMAL LOW (ref 6.5–8.1)

## 2024-05-25 LAB — CBC
HCT: 38.6 % — ABNORMAL LOW (ref 39.0–52.0)
Hemoglobin: 12.8 g/dL — ABNORMAL LOW (ref 13.0–17.0)
MCH: 29.6 pg (ref 26.0–34.0)
MCHC: 33.2 g/dL (ref 30.0–36.0)
MCV: 89.1 fL (ref 80.0–100.0)
Platelets: 177 K/uL (ref 150–400)
RBC: 4.33 MIL/uL (ref 4.22–5.81)
RDW: 12.9 % (ref 11.5–15.5)
WBC: 7.3 K/uL (ref 4.0–10.5)
nRBC: 0 % (ref 0.0–0.2)

## 2024-05-25 LAB — GLUCOSE, CAPILLARY
Glucose-Capillary: 117 mg/dL — ABNORMAL HIGH (ref 70–99)
Glucose-Capillary: 152 mg/dL — ABNORMAL HIGH (ref 70–99)
Glucose-Capillary: 97 mg/dL (ref 70–99)

## 2024-05-25 LAB — LIPASE, BLOOD: Lipase: 161 U/L — ABNORMAL HIGH (ref 11–51)

## 2024-05-25 LAB — CBG MONITORING, ED: Glucose-Capillary: 95 mg/dL (ref 70–99)

## 2024-05-25 MED ORDER — PHENYLEPHRINE HCL (PRESSORS) 10 MG/ML IV SOLN
INTRAVENOUS | Status: DC | PRN
  Administered 2024-05-25: 80 ug via INTRAVENOUS

## 2024-05-25 MED ORDER — PERMETHRIN 5 % EX CREA
TOPICAL_CREAM | Freq: Once | CUTANEOUS | Status: AC
  Administered 2024-05-25: 1 via TOPICAL
  Filled 2024-05-25: qty 60

## 2024-05-25 MED ORDER — MECLIZINE HCL 25 MG PO TABS: 50.0000 mg | ORAL_TABLET | Freq: Two times a day (BID) | ORAL | Status: DC | PRN

## 2024-05-25 MED ORDER — PHENYLEPHRINE 80 MCG/ML (10ML) SYRINGE FOR IV PUSH (FOR BLOOD PRESSURE SUPPORT)
PREFILLED_SYRINGE | INTRAVENOUS | Status: AC
  Filled 2024-05-25: qty 10

## 2024-05-25 MED ORDER — NITROGLYCERIN 0.4 MG SL SUBL: 0.4000 mg | SUBLINGUAL_TABLET | SUBLINGUAL | Status: DC | PRN

## 2024-05-25 MED ORDER — ACETAMINOPHEN 650 MG RE SUPP: 650.0000 mg | Freq: Four times a day (QID) | RECTAL | Status: DC | PRN

## 2024-05-25 MED ORDER — DICLOFENAC SUPPOSITORY 100 MG
RECTAL | Status: AC
  Filled 2024-05-25: qty 1

## 2024-05-25 MED ORDER — TRAZODONE HCL 50 MG PO TABS
25.0000 mg | ORAL_TABLET | Freq: Every evening | ORAL | Status: DC | PRN
  Administered 2024-05-27: 50 mg via ORAL
  Filled 2024-05-25: qty 1

## 2024-05-25 MED ORDER — ALBUTEROL SULFATE (2.5 MG/3ML) 0.083% IN NEBU: 2.5000 mg | INHALATION_SOLUTION | RESPIRATORY_TRACT | Status: DC | PRN

## 2024-05-25 MED ORDER — METOPROLOL TARTRATE 50 MG PO TABS
50.0000 mg | ORAL_TABLET | Freq: Two times a day (BID) | ORAL | Status: DC
  Administered 2024-05-25 – 2024-06-03 (×19): 50 mg via ORAL
  Filled 2024-05-25 (×19): qty 1

## 2024-05-25 MED ORDER — ROSUVASTATIN CALCIUM 20 MG PO TABS
40.0000 mg | ORAL_TABLET | Freq: Every day | ORAL | Status: DC
  Administered 2024-05-25 – 2024-06-03 (×10): 40 mg via ORAL
  Filled 2024-05-25: qty 4
  Filled 2024-05-25 (×2): qty 2
  Filled 2024-05-25 (×2): qty 4
  Filled 2024-05-25 (×3): qty 2
  Filled 2024-05-25: qty 4
  Filled 2024-05-25: qty 2

## 2024-05-25 MED ORDER — ONDANSETRON HCL 4 MG PO TABS: 4.0000 mg | ORAL_TABLET | Freq: Four times a day (QID) | ORAL | Status: DC | PRN

## 2024-05-25 MED ORDER — LORAZEPAM 2 MG/ML IJ SOLN: 1.0000 mg | INTRAMUSCULAR | Status: DC | PRN

## 2024-05-25 MED ORDER — BOOST / RESOURCE BREEZE PO LIQD CUSTOM
1.0000 | Freq: Three times a day (TID) | ORAL | Status: AC
  Administered 2024-05-25 – 2024-05-27 (×5): 1 via ORAL

## 2024-05-25 MED ORDER — ISOSORBIDE MONONITRATE ER 60 MG PO TB24
60.0000 mg | ORAL_TABLET | Freq: Two times a day (BID) | ORAL | Status: DC
  Administered 2024-05-25 – 2024-05-29 (×9): 60 mg via ORAL
  Filled 2024-05-25 (×4): qty 2
  Filled 2024-05-25 (×2): qty 1
  Filled 2024-05-25: qty 2
  Filled 2024-05-25: qty 1
  Filled 2024-05-25: qty 2

## 2024-05-25 MED ORDER — PHENOL 1.4 % MT LIQD
1.0000 | OROMUCOSAL | Status: AC | PRN
  Administered 2024-05-25: 1 via OROMUCOSAL
  Filled 2024-05-25: qty 177

## 2024-05-25 MED ORDER — HYDROCODONE-ACETAMINOPHEN 5-325 MG PO TABS
1.0000 | ORAL_TABLET | ORAL | Status: DC | PRN
  Administered 2024-05-26 – 2024-05-27 (×3): 2 via ORAL
  Administered 2024-05-27: 1 via ORAL
  Administered 2024-05-27 – 2024-06-02 (×10): 2 via ORAL
  Filled 2024-05-25: qty 1
  Filled 2024-05-25 (×13): qty 2

## 2024-05-25 MED ORDER — PROPOFOL 10 MG/ML IV BOLUS
INTRAVENOUS | Status: AC
  Filled 2024-05-25: qty 20

## 2024-05-25 MED ORDER — PANTOPRAZOLE SODIUM 40 MG PO TBEC
40.0000 mg | DELAYED_RELEASE_TABLET | Freq: Every day | ORAL | Status: DC
  Administered 2024-05-25 – 2024-06-03 (×10): 40 mg via ORAL
  Filled 2024-05-25 (×10): qty 1

## 2024-05-25 MED ORDER — GLUCAGON HCL RDNA (DIAGNOSTIC) 1 MG IJ SOLR
INTRAMUSCULAR | Status: DC | PRN
  Administered 2024-05-25 (×3): .5 mg via INTRAVENOUS

## 2024-05-25 MED ORDER — GLUCAGON HCL RDNA (DIAGNOSTIC) 1 MG IJ SOLR
INTRAMUSCULAR | Status: AC
  Filled 2024-05-25: qty 1

## 2024-05-25 MED ORDER — LACTATED RINGERS IV SOLN: INTRAVENOUS | Status: DC

## 2024-05-25 MED ORDER — INSULIN ASPART 100 UNIT/ML IJ SOLN
0.0000 [IU] | INTRAMUSCULAR | Status: DC
  Administered 2024-05-26: 2 [IU] via SUBCUTANEOUS
  Administered 2024-05-27 – 2024-05-31 (×5): 1 [IU] via SUBCUTANEOUS
  Filled 2024-05-25 (×5): qty 1
  Filled 2024-05-25: qty 2

## 2024-05-25 MED ORDER — ASPIRIN 81 MG PO CHEW
81.0000 mg | CHEWABLE_TABLET | Freq: Every day | ORAL | Status: DC
  Administered 2024-05-25 – 2024-06-03 (×10): 81 mg via ORAL
  Filled 2024-05-25 (×10): qty 1

## 2024-05-25 MED ORDER — GADOBUTROL 1 MMOL/ML IV SOLN
8.0000 mL | Freq: Once | INTRAVENOUS | Status: AC | PRN
  Administered 2024-05-25: 8 mL via INTRAVENOUS

## 2024-05-25 MED ORDER — DICLOFENAC SUPPOSITORY 100 MG
100.0000 mg | Freq: Once | RECTAL | Status: AC
  Administered 2024-05-25: 100 mg via RECTAL

## 2024-05-25 MED ORDER — ACETAMINOPHEN 325 MG PO TABS
650.0000 mg | ORAL_TABLET | Freq: Four times a day (QID) | ORAL | Status: DC | PRN
  Administered 2024-05-26: 650 mg via ORAL
  Filled 2024-05-25: qty 2

## 2024-05-25 MED ORDER — PROPOFOL 500 MG/50ML IV EMUL
INTRAVENOUS | Status: DC | PRN
  Administered 2024-05-25: 120 mg via INTRAVENOUS
  Administered 2024-05-25: 150 ug/kg/min via INTRAVENOUS

## 2024-05-25 MED ORDER — ONDANSETRON HCL 4 MG/2ML IJ SOLN: 4.0000 mg | Freq: Four times a day (QID) | INTRAMUSCULAR | Status: DC | PRN

## 2024-05-25 MED ORDER — LIDOCAINE HCL (PF) 2 % IJ SOLN
INTRAMUSCULAR | Status: AC
  Filled 2024-05-25: qty 5

## 2024-05-25 MED ORDER — MORPHINE SULFATE (PF) 2 MG/ML IV SOLN
2.0000 mg | INTRAVENOUS | Status: DC | PRN
  Administered 2024-05-25: 2 mg via INTRAVENOUS
  Filled 2024-05-25: qty 1

## 2024-05-25 MED ORDER — LIDOCAINE HCL (PF) 2 % IJ SOLN
INTRAMUSCULAR | Status: DC | PRN
  Administered 2024-05-25: 100 mg via INTRADERMAL

## 2024-05-25 NOTE — Progress Notes (Signed)
 " PROGRESS NOTE    Jeffery Sweeney  FMW:[id number] DOB: 31-Mar-1961 DOA: 05/24/2024 PCP: Towana Small, FNP  Assessment & Plan:   Principal Problem:   Acute gallstone pancreatitis Active Problems:   History of acute cholecystitis s/p percutaneous insertion of cholecystostomy tube 04/29/2024   CAD with history of CABG/Stent   Diabetes mellitus without complication [facility])   Essential hypertension   History of CVA (cerebrovascular accident)  Assessment and Plan: Acute gallstone pancreatitis: s/p percutaneous cholecystostomy 04/29/2024 for acute cholecystitis. Will go for ERCP today as per GI. Will reach out to gen surg to see if pt is a candidate for cholecystectomy   Choledocholithiasis: as per MRCP. Will go for ERCP today as per GI    Hx of CVA: continue on aspirin , statin    HTN: continue on home dose of metoprolol     DM2: well controlled, HbA1c 6.6. Continue on SSI w/ accuchecks   Bed bugs: premethrin ordered. Continue on contact precautions   Hx of CAD: w/ hx of CABG and stent. Continue on metoprolol , aspirin , imdur , statin   Obesity: BMI 31.6. Would benefit from weight loss    DVT prophylaxis: scds Code Status: full  Family Communication:  Disposition Plan: likely d/c back home   Level of care: Med-Surg Consultants:  GI   Procedures:  Antimicrobials:   Subjective: Pt c/o abd pain   Objective: Vitals:   05/25/24 0008 05/25/24 0400 05/25/24 0922 05/25/24 0923  BP: 128/74 132/83 116/81   Pulse: 85 82 81   Resp: 18 (!) 21 18   Temp: 98.3 F (36.8 C) 98.6 F (37 C) 98.4 F (36.9 C)   TempSrc: Oral Oral Oral   SpO2: 98% 93% 97% 95%  Weight:      Height:        Intake/Output Summary (Last 24 hours) at 05/25/2024 1046 Last data filed at 05/25/2024 0308 Gross per 24 hour  Intake 1000 ml  Output --  Net 1000 ml   Filed Weights   05/24/24 1833  Weight: 81 kg    Examination:  General exam: Appears calm and comfortable  Respiratory system: Clear  to auscultation. Respiratory effort normal. Cardiovascular system: S1 & S2 +. No rubs, gallops or clicks.  Gastrointestinal system: Abdomen is obese soft and tender to palpation. Normal bowel sounds heard. Central nervous system: Alert and oriented. No focal neurological deficits. Extremities: Symmetric 5 x 5 power. Skin: No rashes, lesions or ulcers Psychiatry: Judgement and insight appear normal. Mood & affect appropriate.     Data Reviewed: I have personally reviewed following labs and imaging studies  CBC: Recent Labs  Lab 05/24/24 1841 05/25/24 0426  WBC 9.9 7.3  HGB 13.7 12.8*  HCT 41.6 38.6*  MCV 88.1 89.1  PLT 257 177   Basic Metabolic Panel: Recent Labs  Lab 05/24/24 1841 05/25/24 0426  NA 135 139  K 3.7 4.0  CL 101 104  CO2 22 24  GLUCOSE 113* 99  BUN 6* <5*  CREATININE 0.86 0.80  CALCIUM  8.7* 8.2*   GFR: Estimated Creatinine Clearance: 87.7 mL/min (by C-G formula based on SCr of 0.8 mg/dL). Liver Function Tests: Recent Labs  Lab 05/24/24 1841 05/25/24 0426  AST 504* 304*  ALT 201* 219*  ALKPHOS 130* 129*  BILITOT 1.7* 1.6*  PROT 7.1 6.4*  ALBUMIN  3.7 3.3*   Recent Labs  Lab 05/24/24 1841 05/25/24 0426  LIPASE 1,518* 161*   No results for input(s): AMMONIA in the last 168 hours. Coagulation Profile: No results  for input(s): INR, PROTIME in the last 168 hours. Cardiac Enzymes: No results for input(s): CKTOTAL, CKMB, CKMBINDEX, TROPONINI in the last 168 hours. BNP (last 3 results) No results for input(s): PROBNP in the last 8760 hours. HbA1C: No results for input(s): HGBA1C in the last 72 hours. CBG: Recent Labs  Lab 05/25/24 0920  GLUCAP 95   Lipid Profile: No results for input(s): CHOL, HDL, LDLCALC, TRIG, CHOLHDL, LDLDIRECT in the last 72 hours. Thyroid  Function Tests: No results for input(s): TSH, T4TOTAL, FREET4, T3FREE, THYROIDAB in the last 72 hours. Anemia Panel: No results for  input(s): VITAMINB12, FOLATE, FERRITIN, TIBC, IRON, RETICCTPCT in the last 72 hours. Sepsis Labs: No results for input(s): PROCALCITON, LATICACIDVEN in the last 168 hours.  No results found for this or any previous visit (from the past 240 hours).       Radiology Studies: MR ABDOMEN MRCP W WO CONTRAST Result Date: 05/25/2024 EXAM: MRCP WITH AND WITHOUT IV CONTRAST 05/25/2024 09:06:41 AM TECHNIQUE: Multisequence, multiplanar magnetic resonance images of the abdomen with and without intravenous contrast. MRCP sequences were performed. 7.5 mL Gadavis was administered intravenously. COMPARISON: None available. CLINICAL HISTORY: Cholelithiasis. Concern for gallstone pancreatitis. Abdominal pain. Cholecystostomy tube placed 04/29/2024. FINDINGS: LIVER: Unremarkable. GALLBLADDER AND BILIARY SYSTEM: Cholecystostomy tube with tip within the lumen of the gallbladder. The gallbladder is fluid filled but nondistended measuring 2 cm in diameter. The common hepatic duct measures 7 mm. The common bile duct measures 7 mm, consistent with minimal dilatation. Tiny signal abnormality of the distal common bile duct is noted as a hyperintensity on diffusion weighted imaging just above the ampulla on image 52 series 11 and a hypointensity on T2 weighted imaging (image 20 series 4). There is some motion degradation on the MRCP sequences, With potential distal stone seen on image 14 of series 5. No intrahepatic biliary ductal dilatation. SPLEEN: Unremarkable. PANCREAS/PANCREATIC DUCT: Visualized pancreas is unremarkable. No pancreatic ductal dilatation. No pancreatic inflammation. No peripancreatic fluid collections. ADRENAL GLANDS: Unremarkable. KIDNEYS: Benign nonenhancing cysts of the left kidney. No follow up recommended. LYMPH NODES: No enlarged abdominal lymph nodes. VASCULATURE: Unremarkable. PERITONEUM: No ascites. ABDOMINAL WALL: No hernia. No mass. BOWEL: Grossly unremarkable. No bowel obstruction.  BONES: No acute abnormality or worrisome osseous lesion. SOFT TISSUES: Unremarkable. MISCELLANEOUS: Unremarkable. IMPRESSION: 1. Minimal dilatation of the common bile duct (7 mm) with tiny signal abnormality in the distal common bile duct most consistent with a tiny distal CBC stone. 2. Cholecystostomy tube in place with tip within the lumen of the gallbladder, which is fluid filled but nondistended, measuring 2 cm in diameter. 3. No pancreatic ductal dilatation or inflammation. No peripancreatic fluid collections. Electronically signed by: Norleen Boxer MD 05/25/2024 09:38 AM EST RP Workstation: [id number]   MR 3D Recon At Scanner Result Date: 05/25/2024 EXAM: MRCP WITH AND WITHOUT IV CONTRAST 05/25/2024 09:06:41 AM TECHNIQUE: Multisequence, multiplanar magnetic resonance images of the abdomen with and without intravenous contrast. MRCP sequences were performed. 7.5 mL Gadavis was administered intravenously. COMPARISON: None available. CLINICAL HISTORY: Cholelithiasis. Concern for gallstone pancreatitis. Abdominal pain. Cholecystostomy tube placed 04/29/2024. FINDINGS: LIVER: Unremarkable. GALLBLADDER AND BILIARY SYSTEM: Cholecystostomy tube with tip within the lumen of the gallbladder. The gallbladder is fluid filled but nondistended measuring 2 cm in diameter. The common hepatic duct measures 7 mm. The common bile duct measures 7 mm, consistent with minimal dilatation. Tiny signal abnormality of the distal common bile duct is noted as a hyperintensity on diffusion weighted imaging just above the ampulla on image 52 series  11 and a hypointensity on T2 weighted imaging (image 20 series 4). There is some motion degradation on the MRCP sequences, With potential distal stone seen on image 14 of series 5. No intrahepatic biliary ductal dilatation. SPLEEN: Unremarkable. PANCREAS/PANCREATIC DUCT: Visualized pancreas is unremarkable. No pancreatic ductal dilatation. No pancreatic inflammation. No peripancreatic fluid  collections. ADRENAL GLANDS: Unremarkable. KIDNEYS: Benign nonenhancing cysts of the left kidney. No follow up recommended. LYMPH NODES: No enlarged abdominal lymph nodes. VASCULATURE: Unremarkable. PERITONEUM: No ascites. ABDOMINAL WALL: No hernia. No mass. BOWEL: Grossly unremarkable. No bowel obstruction. BONES: No acute abnormality or worrisome osseous lesion. SOFT TISSUES: Unremarkable. MISCELLANEOUS: Unremarkable. IMPRESSION: 1. Minimal dilatation of the common bile duct (7 mm) with tiny signal abnormality in the distal common bile duct most consistent with a tiny distal CBC stone. 2. Cholecystostomy tube in place with tip within the lumen of the gallbladder, which is fluid filled but nondistended, measuring 2 cm in diameter. 3. No pancreatic ductal dilatation or inflammation. No peripancreatic fluid collections. Electronically signed by: Norleen Boxer MD 05/25/2024 09:38 AM EST RP Workstation: [id number]   CT ABDOMEN PELVIS W CONTRAST Result Date: 05/24/2024 EXAM: CT ABDOMEN AND PELVIS WITH CONTRAST 05/24/2024 11:38:53 PM TECHNIQUE: CT of the abdomen and pelvis was performed with the administration of 100 mL of iohexol  (OMNIPAQUE ) 300 MG/ML solution. Multiplanar reformatted images are provided for review. Automated exposure control, iterative reconstruction, and/or weight-based adjustment of the mA/kV was utilized to reduce the radiation dose to as low as reasonably achievable. COMPARISON: Radiopaque biliary scan 04/28/2024, ultrasound abdomen 04/27/2024, CT abdomen and pelvis 06/09/2023. CLINICAL HISTORY: Abdominal pain, acute (Ped 0-17y); epigastric pain, pancreatitis by labs, chole tube in place. FINDINGS: LOWER CHEST: Atelectasis in the lung bases. LIVER: Diffuse fatty infiltration of the liver. GALLBLADDER AND BILE DUCTS: Cholecystostomy tube in place. Mild gallbladder wall thickening and edema. No radiopaque stones identified. No bile duct dilatation. SPLEEN: The spleen is unremarkable. PANCREAS: The  pancreas is unremarkable. ADRENAL GLANDS: The adrenal glands are unremarkable. KIDNEYS, URETERS AND BLADDER: 5.2 cm cyst on the left kidney, unchanged. No imaging follow-up is indicated. 2 mm stone in the lower pole of the left kidney. No hydronephrosis or hydroureter. No ureteral stones are seen. The bladder is normal. GI AND BOWEL: Diverticulosis of the sigmoid colon without evidence of acute diverticulitis. The stomach, small bowel, and colon are not abnormally distended. No wall thickening or inflammatory stranding. The appendix is normal. PERITONEUM AND RETROPERITONEUM: No ascites. No free air. VASCULATURE: Aorta is normal in caliber. LYMPH NODES: No lymphadenopathy. REPRODUCTIVE ORGANS: The prostate gland is not enlarged. BONES AND SOFT TISSUES: Degenerative changes in the spine and hips. No acute osseous abnormality. No focal soft tissue abnormality. IMPRESSION: 1. No CT evidence of acute pancreatitis. 2. Mild gallbladder wall thickening and edema with cholecystostomy tube in place, without radiopaque stones or bile duct dilatation. 3. 2 mm stone in the lower pole of the left kidney, without hydronephrosis or hydroureter. 4. Sigmoid diverticulosis without evidence of acute diverticulitis. Electronically signed by: Elsie Gravely MD 05/24/2024 11:43 PM EST RP Workstation: [id number]        Scheduled Meds:  aspirin   81 mg Oral Daily   insulin  aspart  0-9 Units Subcutaneous Q4H   isosorbide  mononitrate  60 mg Oral BID   metoprolol  tartrate  50 mg Oral BID   pantoprazole   40 mg Oral Daily   rosuvastatin   40 mg Oral Daily   Continuous Infusions:   LOS: 0 days      Anthony  CHRISTELLA Pouch, MD [facility] Pager [phone]  If 7PM-7AM, please contact night-coverage [email or web address] 05/25/2024, 10:46 AM   "

## 2024-05-25 NOTE — Anesthesia Postprocedure Evaluation (Signed)
"   Anesthesia Post Note  Patient: Jeffery Sweeney  Procedure(s) Performed: ERCP, WITH INTERVENTION IF INDICATED  Patient location during evaluation: Endoscopy Anesthesia Type: General Level of consciousness: awake and alert Pain management: pain level controlled Vital Signs Assessment: post-procedure vital signs reviewed and stable Respiratory status: spontaneous breathing, nonlabored ventilation and respiratory function stable Cardiovascular status: blood pressure returned to baseline and stable Postop Assessment: no apparent nausea or vomiting Anesthetic complications: no   There were no known notable events for this encounter.   Last Vitals:  Vitals:   05/25/24 1557 05/25/24 1602  BP: 97/61 102/65  Pulse: 85 87  Resp:    Temp:    SpO2: 93% 91%    Last Pain:  Vitals:   05/25/24 1602  TempSrc:   PainSc: 0-No pain                 Jeffery Sweeney      "

## 2024-05-25 NOTE — Assessment & Plan Note (Addendum)
 S/p percutaneous cholecystostomy 04/29/2024 for acute cholecystitis Lipase 1518  AST/ALT 504/201, alk phos 130 and total bilirubin 1.7 CT abdomen and pelvis with normal pancreas, normal CBD diameter Follow MRCP GI consulted for consideration of ERCP Keep n.p.o. in case of procedure Pain control, antiemetics

## 2024-05-25 NOTE — Progress Notes (Signed)
 Pharmacy consulted regarding elemite cream. They will be sending it up if they cannot find it in the ER

## 2024-05-25 NOTE — Assessment & Plan Note (Signed)
 Sliding scale insulin  coverage

## 2024-05-25 NOTE — Assessment & Plan Note (Signed)
No acute issues. Continue aspirin and statin 

## 2024-05-25 NOTE — Transfer of Care (Signed)
 Immediate Anesthesia Transfer of Care Note  Patient: Jeffery Sweeney  Procedure(s) Performed: ERCP, WITH INTERVENTION IF INDICATED  Patient Location: Endoscopy Unit  Anesthesia Type:General  Level of Consciousness: drowsy  Airway & Oxygen Therapy: Patient Spontanous Breathing  Post-op Assessment: Report given to RN and Post -op Vital signs reviewed and stable  Post vital signs: Reviewed and stable  Last Vitals:  Vitals Value Taken Time  BP 112/70   Temp    Pulse 99   Resp 12   SpO2 94     Last Pain:  Vitals:   05/25/24 1355  TempSrc: Tympanic  PainSc: 0-No pain         Complications: There were no known notable events for this encounter.

## 2024-05-25 NOTE — H&P (Signed)
 " History and Physical    Patient: Jeffery Sweeney DOB: 06/20/1960 DOA: 05/24/2024 DOS: the patient was seen and examined on 05/25/2024 PCP: Towana Small, FNP  Patient coming from: Home  Chief Complaint:  Chief Complaint  Patient presents with   Abdominal Pain    HPI: Jeffery Sweeney is a 64 y.o. male with medical history significant for Prior CVA, hypertension, coronary artery disease, status post CABG and PCI and stent, , recently hospitalized (12/10-12/16/25) for acute cholecystitis, undergoing cholecystostomy on 04/29/2024, discharged home on Augmentin , being admitted with possible gallstone pancreatitis.  He presented with upper abdominal pain starting today.  Has no nausea, vomiting or diarrhea and no fever, no cough and shortness of breath. In the ED, vitals within normal limits. Labs notable for lipase 1518  AST/ALT 504/201, alk phos 130 and total bilirubin 1.7.  LFTs were essentially normal following his procedure few weeks prior.  CBC was normal. EKG showed NSR at 84 CT abdomen and pelvis with contrast showed no evidence of pancreatitis.  Showed no radiopaque stones or bile duct dilatation.  Mild gallbladder wall thickening and edema.  Cholecystostomy tube in place. MRCP ordered and pending Admission requested     Past Medical History:  Diagnosis Date   CVA (cerebral vascular accident) (HCC) 06/2021   Small acute/subacute infarcts in the left occipital lobe and   Hyperlipidemia LDL goal <70    started on Repatha  09/26/2021, continue Crestor  40mg  daily   Hypertension    Multiple vessel coronary artery disease    Myocardial infarct (HCC)    x 3; PCI with single stent placement for each (in [place] )   NSTEMI (non-ST elevated myocardial infarction) (HCC)    LHC 2/18 with multivessel CAD and transfer to [facility] for CABG eval   STEMI (ST elevation myocardial infarction) (HCC) 09/23/2021   LHC 09/23/2021 x 2   Tobacco use    2 cigarettes daily    Unstable angina (HCC)    LHC 09/23/2021 x 2; total 8 DES to LIMA   Past Surgical History:  Procedure Laterality Date   CORONARY ANGIOPLASTY WITH STENT PLACEMENT     in [place]  in 2018-19   CORONARY ARTERY BYPASS GRAFT N/A 07/09/2020   Procedure: CORONARY ARTERY BYPASS GRAFTING (CABG) x THREE , USING LEFT INTERNAL MAMMARY ARTERY, AND RIGHT LEG GREATER SAPHENOUS VEIN HARVESTED ENDOSCOPICALLY;  Surgeon: Army Dallas NOVAK, MD;  Location: [facility] OR;  Service: Open Heart Surgery;  Laterality: N/A;   CORONARY PRESSURE/FFR STUDY N/A 07/06/2020   Procedure: INTRAVASCULAR PRESSURE WIRE/FFR STUDY;  Surgeon: Darron Deatrice LABOR, MD;  Location: [facility] INVASIVE CV LAB;  Service: Cardiovascular;  Laterality: N/A;  LAD   CORONARY STENT INTERVENTION N/A 09/23/2021   Procedure: CORONARY STENT INTERVENTION;  Surgeon: Darron Deatrice LABOR, MD;  Location: [facility] INVASIVE CV LAB;  Service: Cardiovascular;  Laterality: N/A;   CORONARY/GRAFT ACUTE MI REVASCULARIZATION N/A 09/23/2021   Procedure: Coronary/Graft Acute MI Revascularization;  Surgeon: Darron Deatrice LABOR, MD;  Location: [facility] INVASIVE CV LAB;  Service: Cardiovascular;  Laterality: N/A;   IR PERC CHOLECYSTOSTOMY  04/29/2024   LEFT HEART CATH AND CORONARY ANGIOGRAPHY N/A 07/06/2020   Procedure: LEFT HEART CATH AND CORONARY ANGIOGRAPHY;  Surgeon: Darron Deatrice LABOR, MD;  Location: [facility] INVASIVE CV LAB;  Service: Cardiovascular;  Laterality: N/A;   LEFT HEART CATH AND CORONARY ANGIOGRAPHY N/A 09/23/2021   Procedure: LEFT HEART CATH AND CORONARY ANGIOGRAPHY;  Surgeon: Darron Deatrice LABOR, MD;  Location: [facility] INVASIVE CV LAB;  Service: Cardiovascular;  Laterality: N/A;  LEFT HEART CATH AND CORS/GRAFTS ANGIOGRAPHY N/A 09/23/2021   Procedure: LEFT HEART CATH AND CORS/GRAFTS ANGIOGRAPHY;  Surgeon: Darron Deatrice LABOR, MD;  Location: [facility] INVASIVE CV LAB;  Service: Cardiovascular;  Laterality: N/A;   TEE WITHOUT CARDIOVERSION N/A 07/09/2020   Procedure: TRANSESOPHAGEAL ECHOCARDIOGRAM (TEE);   Surgeon: Army Dallas NOVAK, MD;  Location: [facility] OR;  Service: Open Heart Surgery;  Laterality: N/A;   WRIST SURGERY Left    for cyst   Social History:  reports that he has been smoking cigarettes. He has been smoking an average of 0.5 packs per day. He has been exposed to tobacco smoke. He has never used smokeless tobacco. He reports that he does not currently use alcohol. He reports that he does not currently use drugs.  Allergies[1]  Family History  Problem Relation Age of Onset   Diabetes Mellitus II Father     Prior to Admission medications  Medication Sig Start Date End Date Taking? Authorizing Provider  acetaminophen  (TYLENOL ) 500 MG tablet Take 2 tablets (1,000 mg total) by mouth every 6 (six) hours as needed. 10/15/23 10/14/24 Yes Clarine Ozell LABOR, MD  aspirin  81 MG chewable tablet Chew 1 tablet (81 mg total) by mouth daily. 02/23/24  Yes Towana Small, FNP  clopidogrel  (PLAVIX ) 75 MG tablet Take 1 tablet (75 mg total) by mouth daily. 02/15/24  Yes Darron Deatrice LABOR, MD  meclizine  (ANTIVERT ) 25 MG tablet Take 2 tablets (50 mg total) by mouth 2 (two) times daily as needed for dizziness. 02/23/24  Yes Towana Small, FNP  metFORMIN  (GLUCOPHAGE -XR) 500 MG 24 hr tablet Take 2 tablets (1,000 mg total) by mouth 2 (two) times daily with a meal. 10/20/23  Yes Towana Small, FNP  metoprolol  tartrate (LOPRESSOR ) 50 MG tablet Take 1 tablet (50 mg total) by mouth 2 (two) times daily. 02/23/24  Yes Towana Small, FNP  nitroGLYCERIN  (NITROSTAT ) 0.4 MG SL tablet Place 1 tablet (0.4 mg total) under the tongue every 5 (five) minutes as needed for chest pain. 09/26/21  Yes Hammock, Sheri, NP  pantoprazole  (PROTONIX ) 20 MG tablet Take 2 tablets (40 mg total) by mouth daily. 02/23/24  Yes Towana Small, FNP  rosuvastatin  (CRESTOR ) 40 MG tablet Take 1 tablet by mouth once daily 02/23/24  Yes Towana Small, FNP  sodium chloride  flush 0.9 % SOLN injection 5 mLs by Intracatheter route daily. 05/02/24  07/01/24 Yes Piscoya, Aloysius, MD  sodium chloride  flush 0.9 % SOLN injection 5 mLs by Intracatheter route daily. Flush cholecystostomy tube daily with 5 ccs NS 05/02/24 07/01/24 Yes Schulz, Zachary R, PA-C  traZODone  (DESYREL ) 50 MG tablet Take 0.5-1 tablets (25-50 mg total) by mouth at bedtime as needed for sleep. 02/23/24  Yes Towana Small, FNP  albuterol  (VENTOLIN  HFA) 108 (90 Base) MCG/ACT inhaler Inhale 2 puffs into the lungs every 6 (six) hours as needed for wheezing or shortness of breath. Patient not taking: Reported on 05/25/2024 06/16/23   Towana Small, FNP  isosorbide  mononitrate (IMDUR ) 30 MG 24 hr tablet Take 2 tablets (60 mg total) by mouth 2 (two) times daily. 02/23/24 05/23/24  Towana Small, FNP    Physical Exam: Vitals:   05/24/24 1833 05/24/24 1834 05/24/24 2110 05/25/24 0008  BP:  133/83 136/75 128/74  Pulse:  87 86 85  Resp:  18 18 18   Temp:  (!) 97.5 F (36.4 C) 98 F (36.7 C) 98.3 F (36.8 C)  TempSrc:  Oral Oral Oral  SpO2:  96% 97% 98%  Weight: 81 kg  Height: 5' 3 (1.6 m)      Physical Exam Vitals and nursing note reviewed.  Constitutional:      General: He is not in acute distress. HENT:     Head: Normocephalic and atraumatic.  Cardiovascular:     Rate and Rhythm: Normal rate and regular rhythm.     Heart sounds: Normal heart sounds.  Pulmonary:     Effort: Pulmonary effort is normal.     Breath sounds: Normal breath sounds.  Abdominal:     Palpations: Abdomen is soft.     Tenderness: There is abdominal tenderness in the epigastric area.  Neurological:     Mental Status: Mental status is at baseline.     Labs on Admission: I have personally reviewed following labs and imaging studies  CBC: Recent Labs  Lab 05/24/24 1841  WBC 9.9  HGB 13.7  HCT 41.6  MCV 88.1  PLT 257   Basic Metabolic Panel: Recent Labs  Lab 05/24/24 1841  NA 135  K 3.7  CL 101  CO2 22  GLUCOSE 113*  BUN 6*  CREATININE 0.86  CALCIUM  8.7*    GFR: Estimated Creatinine Clearance: 81.6 mL/min (by C-G formula based on SCr of 0.86 mg/dL). Liver Function Tests: Recent Labs  Lab 05/24/24 1841  AST 504*  ALT 201*  ALKPHOS 130*  BILITOT 1.7*  PROT 7.1  ALBUMIN  3.7   Recent Labs  Lab 05/24/24 1841  LIPASE 1,518*   No results for input(s): AMMONIA in the last 168 hours. Coagulation Profile: No results for input(s): INR, PROTIME in the last 168 hours. Cardiac Enzymes: No results for input(s): CKTOTAL, CKMB, CKMBINDEX, TROPONINI in the last 168 hours. BNP (last 3 results) No results for input(s): PROBNP in the last 8760 hours. HbA1C: No results for input(s): HGBA1C in the last 72 hours. CBG: No results for input(s): GLUCAP in the last 168 hours. Lipid Profile: No results for input(s): CHOL, HDL, LDLCALC, TRIG, CHOLHDL, LDLDIRECT in the last 72 hours. Thyroid  Function Tests: No results for input(s): TSH, T4TOTAL, FREET4, T3FREE, THYROIDAB in the last 72 hours. Anemia Panel: No results for input(s): VITAMINB12, FOLATE, FERRITIN, TIBC, IRON, RETICCTPCT in the last 72 hours. Urine analysis:    Component Value Date/Time   COLORURINE YELLOW (A) 05/25/2024 0234   APPEARANCEUR CLEAR (A) 05/25/2024 0234   LABSPEC 1.045 (H) 05/25/2024 0234   PHURINE 6.0 05/25/2024 0234   GLUCOSEU NEGATIVE 05/25/2024 0234   HGBUR NEGATIVE 05/25/2024 0234   BILIRUBINUR NEGATIVE 05/25/2024 0234   BILIRUBINUR small 06/24/2023 1355   KETONESUR NEGATIVE 05/25/2024 0234   PROTEINUR NEGATIVE 05/25/2024 0234   UROBILINOGEN 1.0 06/24/2023 1355   NITRITE NEGATIVE 05/25/2024 0234   LEUKOCYTESUR NEGATIVE 05/25/2024 0234    Radiological Exams on Admission: CT ABDOMEN PELVIS W CONTRAST Result Date: 05/24/2024 EXAM: CT ABDOMEN AND PELVIS WITH CONTRAST 05/24/2024 11:38:53 PM TECHNIQUE: CT of the abdomen and pelvis was performed with the administration of 100 mL of iohexol  (OMNIPAQUE ) 300 MG/ML  solution. Multiplanar reformatted images are provided for review. Automated exposure control, iterative reconstruction, and/or weight-based adjustment of the mA/kV was utilized to reduce the radiation dose to as low as reasonably achievable. COMPARISON: Radiopaque biliary scan 04/28/2024, ultrasound abdomen 04/27/2024, CT abdomen and pelvis 06/09/2023. CLINICAL HISTORY: Abdominal pain, acute (Ped 0-17y); epigastric pain, pancreatitis by labs, chole tube in place. FINDINGS: LOWER CHEST: Atelectasis in the lung bases. LIVER: Diffuse fatty infiltration of the liver. GALLBLADDER AND BILE DUCTS: Cholecystostomy tube in place. Mild gallbladder wall thickening and  edema. No radiopaque stones identified. No bile duct dilatation. SPLEEN: The spleen is unremarkable. PANCREAS: The pancreas is unremarkable. ADRENAL GLANDS: The adrenal glands are unremarkable. KIDNEYS, URETERS AND BLADDER: 5.2 cm cyst on the left kidney, unchanged. No imaging follow-up is indicated. 2 mm stone in the lower pole of the left kidney. No hydronephrosis or hydroureter. No ureteral stones are seen. The bladder is normal. GI AND BOWEL: Diverticulosis of the sigmoid colon without evidence of acute diverticulitis. The stomach, small bowel, and colon are not abnormally distended. No wall thickening or inflammatory stranding. The appendix is normal. PERITONEUM AND RETROPERITONEUM: No ascites. No free air. VASCULATURE: Aorta is normal in caliber. LYMPH NODES: No lymphadenopathy. REPRODUCTIVE ORGANS: The prostate gland is not enlarged. BONES AND SOFT TISSUES: Degenerative changes in the spine and hips. No acute osseous abnormality. No focal soft tissue abnormality. IMPRESSION: 1. No CT evidence of acute pancreatitis. 2. Mild gallbladder wall thickening and edema with cholecystostomy tube in place, without radiopaque stones or bile duct dilatation. 3. 2 mm stone in the lower pole of the left kidney, without hydronephrosis or hydroureter. 4. Sigmoid  diverticulosis without evidence of acute diverticulitis. Electronically signed by: Elsie Gravely MD 05/24/2024 11:43 PM EST RP Workstation: [id number]   Data Reviewed for HPI: Relevant notes from primary care and specialist visits, past discharge summaries as available in EHR, including Care Everywhere. Prior diagnostic testing as pertinent to current admission diagnoses Updated medications and problem lists for reconciliation ED course, including vitals, labs, imaging, treatment and response to treatment Triage notes, nursing and pharmacy notes and ED provider's notes Notable results as noted above in HPI      Assessment and Plan: * Acute gallstone pancreatitis S/p percutaneous cholecystostomy 04/29/2024 for acute cholecystitis Lipase 1518  AST/ALT 504/201, alk phos 130 and total bilirubin 1.7 CT abdomen and pelvis with normal pancreas, normal CBD diameter Follow MRCP GI consulted for consideration of ERCP Keep n.p.o. in case of procedure Pain control, antiemetics  History of CVA (cerebrovascular accident) No acute issues Continue aspirin  and statin  Essential hypertension Blood pressure controlled Continue metoprolol   Diabetes mellitus without complication (HCC) Sliding scale insulin  coverage  CAD with history of CABG/Stent Denies chest pain and EKG is nonacute Continue aspirin , metoprolol , rosuvastatin  and Imdur         DVT prophylaxis: SCD  Consults: GI  Advance Care Planning:   Code Status: Prior   Family Communication: none  Disposition Plan: Back to previous home environment  Severity of Illness: The appropriate patient status for this patient is OBSERVATION. Observation status is judged to be reasonable and necessary in order to provide the required intensity of service to ensure the patient's safety. The patient's presenting symptoms, physical exam findings, and initial radiographic and laboratory data in the context of their medical condition is felt  to place them at decreased risk for further clinical deterioration. Furthermore, it is anticipated that the patient will be medically stable for discharge from the hospital within 2 midnights of admission.   Author: Delayne LULLA Solian, MD 05/25/2024 3:17 AM  For on call review [email or web address].      [1] No Known Allergies  "

## 2024-05-25 NOTE — Telephone Encounter (Signed)
 Yes, Plavix  can be held for 5 days.  It seems that he is hospitalized now with pancreatitis so not sure that he will be able to make it to the appointment tomorrow.

## 2024-05-25 NOTE — Consult Note (Signed)
 "      Jeffery Copping, MD [facility]  [address], [address] [place], [place] [address] Phone: [phone] Fax : [phone]  Consultation  Referring Provider:     Dr. Dicky Primary Care Physician:  Jeffery Small, FNP Primary Gastroenterologist: Sampson Reason for Consultation:     Choledocholithiasis with pancreatitis  Date of Admission:  05/24/2024 Date of Consultation:  05/25/2024         HPI:   Jeffery Sweeney is a 64 y.o. male gentleman who was in the hospital recently with acute cholecystitis and a cholecystostomy tube placed.  The patient then came back to the hospital yesterday with abdominal pain.  The patient was reporting the pain to be in the mid abdominal area and was started on IV Zosyn .  The patient was discharged on Augmentin .  His CT scan showed:  IMPRESSION: 1. No CT evidence of acute pancreatitis. 2. Mild gallbladder wall thickening and edema with cholecystostomy tube in place, without radiopaque stones or bile duct dilatation. 3. 2 mm stone in the lower pole of the left kidney, without hydronephrosis or hydroureter. 4. Sigmoid diverticulosis without evidence of acute diverticulitis.  And due to these results the patient was sent for an MRCP.  The patient MRCP was done this morning and showed:  IMPRESSION: 1. Minimal dilatation of the common bile duct (7 mm) with tiny signal abnormality in the distal common bile duct most consistent with a tiny distal CBC stone. 2. Cholecystostomy tube in place with tip within the lumen of the gallbladder, which is fluid filled but nondistended, measuring 2 cm in diameter. 3. No pancreatic ductal dilatation or inflammation. No peripancreatic fluid collections.  The patient's liver enzymes showed:  Component     Latest Ref Rng 05/24/2024 05/25/2024  AST     15 - 41 U/L 504 (H)  304 (H)   ALT     0 - 44 U/L 201 (H)  219 (H)   Alkaline Phosphatase     38 - 126 U/L 130 (H)  129 (H)   Total Bilirubin     0.0 - 1.2 mg/dL 1.7  (H)  1.6 (H)    The patient's lipase on admission yesterday was 1518 and this morning went down to 161.  The patient reports that his abdominal pain is much better today.    Past Medical History:  Diagnosis Date   CVA (cerebral vascular accident) (HCC) 06/2021   Sweeney acute/subacute infarcts in the left occipital lobe and   Hyperlipidemia LDL goal <70    started on Repatha  09/26/2021, continue Crestor  40mg  daily   Hypertension    Multiple vessel coronary artery disease    Myocardial infarct (HCC)    x 3; PCI with single stent placement for each (in [place] )   NSTEMI (non-ST elevated myocardial infarction) (HCC)    LHC 2/18 with multivessel CAD and transfer to [facility] for CABG eval   STEMI (ST elevation myocardial infarction) (HCC) 09/23/2021   LHC 09/23/2021 x 2   Tobacco use    2 cigarettes daily   Unstable angina (HCC)    LHC 09/23/2021 x 2; total 8 DES to LIMA    Past Surgical History:  Procedure Laterality Date   CORONARY ANGIOPLASTY WITH STENT PLACEMENT     in [place]  in 2018-19   CORONARY ARTERY BYPASS GRAFT N/A 07/09/2020   Procedure: CORONARY ARTERY BYPASS GRAFTING (CABG) x THREE , USING LEFT INTERNAL MAMMARY ARTERY, AND RIGHT LEG GREATER SAPHENOUS VEIN HARVESTED ENDOSCOPICALLY;  Surgeon: Army Loving  B, MD;  Location: [facility] OR;  Service: Open Heart Surgery;  Laterality: N/A;   CORONARY PRESSURE/FFR STUDY N/A 07/06/2020   Procedure: INTRAVASCULAR PRESSURE WIRE/FFR STUDY;  Surgeon: Darron Deatrice LABOR, MD;  Location: [facility] INVASIVE CV LAB;  Service: Cardiovascular;  Laterality: N/A;  LAD   CORONARY STENT INTERVENTION N/A 09/23/2021   Procedure: CORONARY STENT INTERVENTION;  Surgeon: Darron Deatrice LABOR, MD;  Location: [facility] INVASIVE CV LAB;  Service: Cardiovascular;  Laterality: N/A;   CORONARY/GRAFT ACUTE MI REVASCULARIZATION N/A 09/23/2021   Procedure: Coronary/Graft Acute MI Revascularization;  Surgeon: Darron Deatrice LABOR, MD;  Location: [facility] INVASIVE CV LAB;  Service: Cardiovascular;   Laterality: N/A;   IR PERC CHOLECYSTOSTOMY  04/29/2024   LEFT HEART CATH AND CORONARY ANGIOGRAPHY N/A 07/06/2020   Procedure: LEFT HEART CATH AND CORONARY ANGIOGRAPHY;  Surgeon: Darron Deatrice LABOR, MD;  Location: [facility] INVASIVE CV LAB;  Service: Cardiovascular;  Laterality: N/A;   LEFT HEART CATH AND CORONARY ANGIOGRAPHY N/A 09/23/2021   Procedure: LEFT HEART CATH AND CORONARY ANGIOGRAPHY;  Surgeon: Darron Deatrice LABOR, MD;  Location: [facility] INVASIVE CV LAB;  Service: Cardiovascular;  Laterality: N/A;   LEFT HEART CATH AND CORS/GRAFTS ANGIOGRAPHY N/A 09/23/2021   Procedure: LEFT HEART CATH AND CORS/GRAFTS ANGIOGRAPHY;  Surgeon: Darron Deatrice LABOR, MD;  Location: [facility] INVASIVE CV LAB;  Service: Cardiovascular;  Laterality: N/A;   TEE WITHOUT CARDIOVERSION N/A 07/09/2020   Procedure: TRANSESOPHAGEAL ECHOCARDIOGRAM (TEE);  Surgeon: Army Dallas NOVAK, MD;  Location: [facility] OR;  Service: Open Heart Surgery;  Laterality: N/A;   WRIST SURGERY Left    for cyst    Prior to Admission medications  Medication Sig Start Date End Date Taking? Authorizing Provider  acetaminophen  (TYLENOL ) 500 MG tablet Take 2 tablets (1,000 mg total) by mouth every 6 (six) hours as needed. 10/15/23 10/14/24 Yes Clarine Ozell LABOR, MD  aspirin  81 MG chewable tablet Chew 1 tablet (81 mg total) by mouth daily. 02/23/24  Yes Jeffery Small, FNP  clopidogrel  (PLAVIX ) 75 MG tablet Take 1 tablet (75 mg total) by mouth daily. 02/15/24  Yes Darron Deatrice LABOR, MD  isosorbide  mononitrate (IMDUR ) 30 MG 24 hr tablet Take 2 tablets (60 mg total) by mouth 2 (two) times daily. 02/23/24 05/25/24 Yes Jeffery Small, FNP  meclizine  (ANTIVERT ) 25 MG tablet Take 2 tablets (50 mg total) by mouth 2 (two) times daily as needed for dizziness. 02/23/24  Yes Jeffery Small, FNP  metFORMIN  (GLUCOPHAGE -XR) 500 MG 24 hr tablet Take 2 tablets (1,000 mg total) by mouth 2 (two) times daily with a meal. 10/20/23  Yes Jeffery Small, FNP  metoprolol  tartrate (LOPRESSOR ) 50 MG  tablet Take 1 tablet (50 mg total) by mouth 2 (two) times daily. 02/23/24  Yes Jeffery Small, FNP  nitroGLYCERIN  (NITROSTAT ) 0.4 MG SL tablet Place 1 tablet (0.4 mg total) under the tongue every 5 (five) minutes as needed for chest pain. 09/26/21  Yes Hammock, Sheri, NP  pantoprazole  (PROTONIX ) 20 MG tablet Take 2 tablets (40 mg total) by mouth daily. 02/23/24  Yes Butler, Kristina, FNP  rosuvastatin  (CRESTOR ) 40 MG tablet Take 1 tablet by mouth once daily 02/23/24  Yes Butler, Kristina, FNP  traZODone  (DESYREL ) 50 MG tablet Take 0.5-1 tablets (25-50 mg total) by mouth at bedtime as needed for sleep. 02/23/24  Yes Jeffery Small, FNP  albuterol  (VENTOLIN  HFA) 108 (90 Base) MCG/ACT inhaler Inhale 2 puffs into the lungs every 6 (six) hours as needed for wheezing or shortness of breath. Patient not taking: Reported on 05/25/2024 06/16/23  Jeffery Small, FNP  sodium chloride  flush 0.9 % SOLN injection 5 mLs by Intracatheter route daily. 05/02/24 07/01/24  Desiderio Schanz, MD  sodium chloride  flush 0.9 % SOLN injection 5 mLs by Intracatheter route daily. Flush cholecystostomy tube daily with 5 ccs NS 05/02/24 07/01/24  Schulz, Zachary R, PA-C    Family History  Problem Relation Age of Onset   Diabetes Mellitus II Father      Social History[1]  Allergies as of 05/24/2024   (No Known Allergies)    Review of Systems:    All systems reviewed and negative except where noted in HPI.   Physical Exam:  Vital signs in last 24 hours: Temp:  [97.5 F (36.4 C)-98.6 F (37 C)] 98.4 F (36.9 C) (01/07 0922) Pulse Rate:  [81-87] 81 (01/07 0922) Resp:  [18-21] 18 (01/07 0922) BP: (116-136)/(74-83) 116/81 (01/07 0922) SpO2:  [93 %-98 %] 95 % (01/07 0923) Weight:  [81 kg] 81 kg (01/06 1833)   General:   Pleasant, cooperative in NAD Head:  Normocephalic and atraumatic. Eyes:   No icterus.   Conjunctiva pink. PERRLA. Ears:  Normal auditory acuity. Neck:  Supple; no masses or thyroidomegaly Lungs:  Respirations even and unlabored. Lungs clear to auscultation bilaterally.   No wheezes, crackles, or rhonchi.  Heart:  Regular rate and rhythm;  Without murmur, clicks, rubs or gallops Abdomen:  Soft, nondistended, nontender. Normal bowel sounds. No appreciable masses or hepatomegaly.  No rebound or guarding.  Rectal:  Not performed. Msk:  Symmetrical without gross deformities.    Extremities:  Without edema, cyanosis or clubbing. Neurologic:  Alert and oriented x3;  grossly normal neurologically. Skin:  Intact without significant lesions or rashes. Cervical Nodes:  No significant cervical adenopathy. Psych:  Alert and cooperative. Normal affect.  LAB RESULTS: Recent Labs    05/24/24 1841 05/25/24 0426  WBC 9.9 7.3  HGB 13.7 12.8*  HCT 41.6 38.6*  PLT 257 177   BMET Recent Labs    05/24/24 1841 05/25/24 0426  NA 135 139  K 3.7 4.0  CL 101 104  CO2 22 24  GLUCOSE 113* 99  BUN 6* <5*  CREATININE 0.86 0.80  CALCIUM  8.7* 8.2*   LFT Recent Labs    05/25/24 0426  PROT 6.4*  ALBUMIN  3.3*  AST 304*  ALT 219*  ALKPHOS 129*  BILITOT 1.6*   PT/INR No results for input(s): LABPROT, INR in the last 72 hours.  STUDIES: MR ABDOMEN MRCP W WO CONTRAST Result Date: 05/25/2024 EXAM: MRCP WITH AND WITHOUT IV CONTRAST 05/25/2024 09:06:41 AM TECHNIQUE: Multisequence, multiplanar magnetic resonance images of the abdomen with and without intravenous contrast. MRCP sequences were performed. 7.5 mL Gadavis was administered intravenously. COMPARISON: None available. CLINICAL HISTORY: Cholelithiasis. Concern for gallstone pancreatitis. Abdominal pain. Cholecystostomy tube placed 04/29/2024. FINDINGS: LIVER: Unremarkable. GALLBLADDER AND BILIARY SYSTEM: Cholecystostomy tube with tip within the lumen of the gallbladder. The gallbladder is fluid filled but nondistended measuring 2 cm in diameter. The common hepatic duct measures 7 mm. The common bile duct measures 7 mm, consistent with minimal  dilatation. Tiny signal abnormality of the distal common bile duct is noted as a hyperintensity on diffusion weighted imaging just above the ampulla on image 52 series 11 and a hypointensity on T2 weighted imaging (image 20 series 4). There is some motion degradation on the MRCP sequences, With potential distal stone seen on image 14 of series 5. No intrahepatic biliary ductal dilatation. SPLEEN: Unremarkable. PANCREAS/PANCREATIC DUCT: Visualized pancreas is unremarkable. No  pancreatic ductal dilatation. No pancreatic inflammation. No peripancreatic fluid collections. ADRENAL GLANDS: Unremarkable. KIDNEYS: Benign nonenhancing cysts of the left kidney. No follow up recommended. LYMPH NODES: No enlarged abdominal lymph nodes. VASCULATURE: Unremarkable. PERITONEUM: No ascites. ABDOMINAL WALL: No hernia. No mass. BOWEL: Grossly unremarkable. No bowel obstruction. BONES: No acute abnormality or worrisome osseous lesion. SOFT TISSUES: Unremarkable. MISCELLANEOUS: Unremarkable. IMPRESSION: 1. Minimal dilatation of the common bile duct (7 mm) with tiny signal abnormality in the distal common bile duct most consistent with a tiny distal CBC stone. 2. Cholecystostomy tube in place with tip within the lumen of the gallbladder, which is fluid filled but nondistended, measuring 2 cm in diameter. 3. No pancreatic ductal dilatation or inflammation. No peripancreatic fluid collections. Electronically signed by: Norleen Boxer MD 05/25/2024 09:38 AM EST RP Workstation: [id number]   MR 3D Recon At Scanner Result Date: 05/25/2024 EXAM: MRCP WITH AND WITHOUT IV CONTRAST 05/25/2024 09:06:41 AM TECHNIQUE: Multisequence, multiplanar magnetic resonance images of the abdomen with and without intravenous contrast. MRCP sequences were performed. 7.5 mL Gadavis was administered intravenously. COMPARISON: None available. CLINICAL HISTORY: Cholelithiasis. Concern for gallstone pancreatitis. Abdominal pain. Cholecystostomy tube placed 04/29/2024.  FINDINGS: LIVER: Unremarkable. GALLBLADDER AND BILIARY SYSTEM: Cholecystostomy tube with tip within the lumen of the gallbladder. The gallbladder is fluid filled but nondistended measuring 2 cm in diameter. The common hepatic duct measures 7 mm. The common bile duct measures 7 mm, consistent with minimal dilatation. Tiny signal abnormality of the distal common bile duct is noted as a hyperintensity on diffusion weighted imaging just above the ampulla on image 52 series 11 and a hypointensity on T2 weighted imaging (image 20 series 4). There is some motion degradation on the MRCP sequences, With potential distal stone seen on image 14 of series 5. No intrahepatic biliary ductal dilatation. SPLEEN: Unremarkable. PANCREAS/PANCREATIC DUCT: Visualized pancreas is unremarkable. No pancreatic ductal dilatation. No pancreatic inflammation. No peripancreatic fluid collections. ADRENAL GLANDS: Unremarkable. KIDNEYS: Benign nonenhancing cysts of the left kidney. No follow up recommended. LYMPH NODES: No enlarged abdominal lymph nodes. VASCULATURE: Unremarkable. PERITONEUM: No ascites. ABDOMINAL WALL: No hernia. No mass. BOWEL: Grossly unremarkable. No bowel obstruction. BONES: No acute abnormality or worrisome osseous lesion. SOFT TISSUES: Unremarkable. MISCELLANEOUS: Unremarkable. IMPRESSION: 1. Minimal dilatation of the common bile duct (7 mm) with tiny signal abnormality in the distal common bile duct most consistent with a tiny distal CBC stone. 2. Cholecystostomy tube in place with tip within the lumen of the gallbladder, which is fluid filled but nondistended, measuring 2 cm in diameter. 3. No pancreatic ductal dilatation or inflammation. No peripancreatic fluid collections. Electronically signed by: Norleen Boxer MD 05/25/2024 09:38 AM EST RP Workstation: [id number]   CT ABDOMEN PELVIS W CONTRAST Result Date: 05/24/2024 EXAM: CT ABDOMEN AND PELVIS WITH CONTRAST 05/24/2024 11:38:53 PM TECHNIQUE: CT of the abdomen and  pelvis was performed with the administration of 100 mL of iohexol  (OMNIPAQUE ) 300 MG/ML solution. Multiplanar reformatted images are provided for review. Automated exposure control, iterative reconstruction, and/or weight-based adjustment of the mA/kV was utilized to reduce the radiation dose to as low as reasonably achievable. COMPARISON: Radiopaque biliary scan 04/28/2024, ultrasound abdomen 04/27/2024, CT abdomen and pelvis 06/09/2023. CLINICAL HISTORY: Abdominal pain, acute (Ped 0-17y); epigastric pain, pancreatitis by labs, chole tube in place. FINDINGS: LOWER CHEST: Atelectasis in the lung bases. LIVER: Diffuse fatty infiltration of the liver. GALLBLADDER AND BILE DUCTS: Cholecystostomy tube in place. Mild gallbladder wall thickening and edema. No radiopaque stones identified. No bile duct dilatation. SPLEEN: The spleen  is unremarkable. PANCREAS: The pancreas is unremarkable. ADRENAL GLANDS: The adrenal glands are unremarkable. KIDNEYS, URETERS AND BLADDER: 5.2 cm cyst on the left kidney, unchanged. No imaging follow-up is indicated. 2 mm stone in the lower pole of the left kidney. No hydronephrosis or hydroureter. No ureteral stones are seen. The bladder is normal. GI AND BOWEL: Diverticulosis of the sigmoid colon without evidence of acute diverticulitis. The stomach, Sweeney bowel, and colon are not abnormally distended. No wall thickening or inflammatory stranding. The appendix is normal. PERITONEUM AND RETROPERITONEUM: No ascites. No free air. VASCULATURE: Aorta is normal in caliber. LYMPH NODES: No lymphadenopathy. REPRODUCTIVE ORGANS: The prostate gland is not enlarged. BONES AND SOFT TISSUES: Degenerative changes in the spine and hips. No acute osseous abnormality. No focal soft tissue abnormality. IMPRESSION: 1. No CT evidence of acute pancreatitis. 2. Mild gallbladder wall thickening and edema with cholecystostomy tube in place, without radiopaque stones or bile duct dilatation. 3. 2 mm stone in the  lower pole of the left kidney, without hydronephrosis or hydroureter. 4. Sigmoid diverticulosis without evidence of acute diverticulitis. Electronically signed by: Elsie Gravely MD 05/24/2024 11:43 PM EST RP Workstation: [id number]      Impression / Plan:   Assessment: Principal Problem:   Acute gallstone pancreatitis Active Problems:   CAD with history of CABG/Stent   Diabetes mellitus without complication (HCC)   Essential hypertension   History of CVA (cerebrovascular accident)   History of acute cholecystitis s/p percutaneous insertion of cholecystostomy tube 04/29/2024   Hartwell Vandiver is a 64 y.o. y/o male with a recent admission for acute cholecystitis with a cholecystostomy tube placed at that time.  The patient has a history of coronary artery disease and a history of a CABG and stents.  The patient was noted to have abnormal lipase and elevated liver enzymes and bilirubin with a CT scan not showing any signs of pancreatitis but the symptoms and the lipase are consistent with the patient's pancreatitis.  The patient had been kept n.p.o. after midnight for a possible ERCP.  The patient underwent an MRCP that showed a abnormality consistent with filling defects in the distal CBD.  Plan:  The patient will have an ERCP done today for his choledocholithiasis.  The patient has been explained the risks and benefits including worsening pancreatitis infection bleeding perforation and death.  The patient has been told the possible alternatives.  The patient has agreed to undergoing the ERCP and will be put on for an ERCP for today.  Thank you for involving me in the care of this patient.      LOS: 0 days   Jeffery Copping, MD, MD. NOLIA 05/25/2024, 11:03 AM,  Pager [phone] 7am-5pm  Check AMION for 5pm -7am coverage and on weekends   Note: This dictation was prepared with Dragon dictation along with smaller phrase technology. Any transcriptional errors that result from this  process are unintentional.       [1]  Social History Tobacco Use   Smoking status: Some Days    Current packs/day: 0.00    Average packs/day: 0.5 packs/day    Types: Cigarettes    Last attempt to quit: 07/06/2020    Years since quitting: 3.8    Passive exposure: Past   Smokeless tobacco: Never   Tobacco comments:    [employer] info given. 11/17/23  Vaping Use   Vaping status: Never Used  Substance Use Topics   Alcohol use: Not Currently   Drug use: Not Currently   "

## 2024-05-25 NOTE — Anesthesia Preprocedure Evaluation (Addendum)
"                                    Anesthesia Evaluation  Patient identified by MRN, date of birth, ID band Patient awake    Reviewed: Allergy & Precautions, NPO status , Patient's Chart, lab work & pertinent test results, reviewed documented beta blocker date and time   Airway Mallampati: II  TM Distance: >3 FB Neck ROM: Full    Dental  (+) Dental Advisory Given, Missing   Pulmonary Current Smoker and Patient abstained from smoking.   Pulmonary exam normal breath sounds clear to auscultation       Cardiovascular hypertension, Pt. on home beta blockers + angina  + CAD, + Past MI (2023), + Cardiac Stents (LHC 09/23/2021 x 2; total 8 DES to LIMA) and + CABG  Normal cardiovascular exam Rhythm:Regular Rate:Normal  Echo 6/24:  1. Left ventricular ejection fraction, by estimation, is 55 to 60%. The  left ventricle has normal function. The left ventricle has no regional  wall motion abnormalities. Left ventricular diastolic parameters are  consistent with Grade I diastolic  dysfunction (impaired relaxation). The average left ventricular global  longitudinal strain is -17.0 %.   2. Right ventricular systolic function is normal. The right ventricular  size is normal. Tricuspid regurgitation signal is inadequate for assessing  PA pressure.   3. The mitral valve is normal in structure. Mild mitral valve  regurgitation. No evidence of mitral stenosis.   4. The aortic valve has an indeterminant number of cusps. Aortic valve  regurgitation is mild. Aortic valve sclerosis is present, with no evidence  of aortic valve stenosis.   5. The inferior vena cava is normal in size with greater than 50%  respiratory variability, suggesting right atrial pressure of 3 mmHg.     Neuro/Psych CVA (2023)    GI/Hepatic negative GI ROS, Neg liver ROS,,,  Endo/Other  diabetes  Obesity   Renal/GU negative Renal ROS     Musculoskeletal negative musculoskeletal ROS (+)     Abdominal Normal abdominal exam  (+)   Peds  Hematology negative hematology ROS (+)   Anesthesia Other Findings acute cholecystitis, status post percutaneous cholecystostomy drain placement now with choledocholithiasis  Reproductive/Obstetrics                              Anesthesia Physical Anesthesia Plan  ASA: 3  Anesthesia Plan: General   Post-op Pain Management:    Induction: Intravenous  PONV Risk Score and Plan: 1 and Treatment may vary due to age or medical condition, Propofol  infusion and TIVA  Airway Management Planned: Natural Airway  Additional Equipment:   Intra-op Plan:   Post-operative Plan:   Informed Consent: I have reviewed the patients History and Physical, chart, labs and discussed the procedure including the risks, benefits and alternatives for the proposed anesthesia with the patient or authorized representative who has indicated his/her understanding and acceptance.       Plan Discussed with:   Anesthesia Plan Comments:          Anesthesia Quick Evaluation  "

## 2024-05-25 NOTE — ED Notes (Signed)
 MRI will come for patient at [facility]

## 2024-05-25 NOTE — Assessment & Plan Note (Signed)
 Denies chest pain and EKG is nonacute Continue aspirin , metoprolol , rosuvastatin  and Imdur 

## 2024-05-25 NOTE — Progress Notes (Signed)
 PT otf for procedure

## 2024-05-25 NOTE — Op Note (Signed)
 [facility] Patient Name: Jeffery Sweeney Procedure Date: 05/25/2024 1:37 PM MRN: [id number] Account #: [id number] Date of Birth: 10/26/1960 Admit Type: Inpatient Age: 64 Room: [facility] ENDO ROOM 4 Gender: Male Note Status: Finalized Instrument Name: CELINDA [id number] Procedure:             ERCP Indications:           Bile duct stone(s). Plavix  taken 1 day ago. Providers:             Rogelia Copping MD, MD Referring MD:          Evalene Arts (Referring MD) Medicines:             Propofol  per Anesthesia Complications:         No immediate complications. Procedure:             Pre-Anesthesia Assessment:                        - Prior to the procedure, a History and Physical was                         performed, and patient medications and allergies were                         reviewed. The patient's tolerance of previous                         anesthesia was also reviewed. The risks and benefits                         of the procedure and the sedation options and risks                         were discussed with the patient. All questions were                         answered, and informed consent was obtained. Prior                         Anticoagulants: The patient has taken no anticoagulant                         or antiplatelet agents. ASA Grade Assessment: II - A                         patient with mild systemic disease. After reviewing                         the risks and benefits, the patient was deemed in                         satisfactory condition to undergo the procedure.                        After obtaining informed consent, the scope was passed                         under direct vision. Throughout the procedure, the  patient's blood pressure, pulse, and oxygen                         saturations were monitored continuously. The                         Duodenoscope was introduced through the mouth, and                          used to inject contrast into and used to inject                         contrast into the ventral pancreatic duct. The ERCP                         was technically difficult and complex due to                         challenging cannulation because of abnormal anatomy.                         The patient tolerated the procedure well. Findings:      A scout film of the abdomen was obtained. One percutaneous drain ending       in the Subhepatic space was seen. The major papilla was small. The       ventral pancreatic duct was deeply cannulated with the short-nosed       traction sphincterotome. Contrast was injected. I personally interpreted       the pancreatic duct images. There was brisk flow of contrast through the       ducts. Image quality was excellent. Contrast extended to the proximal       pancreatic duct. The bile duct could not be cannulated. A small stone       did come out with manipulation of the ampulla. Impression:            - The major papilla appeared to be small.                        - There was an enlarged abnormal duodenal fold                         obscuring the ampulla Recommendation:        - Return patient to hospital ward for ongoing care.                        - Clear liquid diet.                        - The patient will have a drain placed by IR for                         drainage of the bile duct. Procedure Code(s):     --- Professional ---                        [phone], Endoscopic retrograde cholangiopancreatography                         (ERCP);  diagnostic, including collection of                         specimen(s) by brushing or washing, when performed                         (separate procedure)                        [id number], Endoscopic catheterization of the pancreatic                         ductal system, radiological supervision and                         interpretation Diagnosis Code(s):     --- Professional ---                         K80.50, Calculus of bile duct without cholangitis or                         cholecystitis without obstruction CPT copyright 2022 [employer]. All rights reserved. The codes documented in this report are preliminary and upon coder review may  be revised to meet current compliance requirements. Rogelia Copping MD, MD 05/25/2024 3:47:57 PM This report has been signed electronically. Number of Addenda: 0 Note Initiated On: 05/25/2024 1:37 PM Estimated Blood Loss:  Estimated blood loss: none.      [facility]

## 2024-05-25 NOTE — ED Notes (Signed)
 MRI came to get patient but states they saw two bedbugs on sheet and did not bring patient to MRI, decon patient again

## 2024-05-25 NOTE — ED Notes (Signed)
 This RN gave report to OR nurse

## 2024-05-25 NOTE — Assessment & Plan Note (Signed)
Blood pressure controlled.  Continue metoprolol. 

## 2024-05-25 NOTE — Hospital Course (Addendum)
 Jeffery Sweeney

## 2024-05-25 NOTE — ED Notes (Signed)
 Pt at MRI, will check cbg upon return

## 2024-05-26 ENCOUNTER — Encounter: Payer: Self-pay | Admitting: Nurse Practitioner

## 2024-05-26 ENCOUNTER — Ambulatory Visit: Payer: Self-pay | Admitting: Nurse Practitioner

## 2024-05-26 DIAGNOSIS — K8050 Calculus of bile duct without cholangitis or cholecystitis without obstruction: Secondary | ICD-10-CM

## 2024-05-26 LAB — CBC
HCT: 36.3 % — ABNORMAL LOW (ref 39.0–52.0)
Hemoglobin: 12.2 g/dL — ABNORMAL LOW (ref 13.0–17.0)
MCH: 29.3 pg (ref 26.0–34.0)
MCHC: 33.6 g/dL (ref 30.0–36.0)
MCV: 87.3 fL (ref 80.0–100.0)
Platelets: 184 K/uL (ref 150–400)
RBC: 4.16 MIL/uL — ABNORMAL LOW (ref 4.22–5.81)
RDW: 13 % (ref 11.5–15.5)
WBC: 6 K/uL (ref 4.0–10.5)
nRBC: 0 % (ref 0.0–0.2)

## 2024-05-26 LAB — COMPREHENSIVE METABOLIC PANEL WITH GFR
ALT: 122 U/L — ABNORMAL HIGH (ref 0–44)
AST: 85 U/L — ABNORMAL HIGH (ref 15–41)
Albumin: 3.2 g/dL — ABNORMAL LOW (ref 3.5–5.0)
Alkaline Phosphatase: 120 U/L (ref 38–126)
Anion gap: 11 (ref 5–15)
BUN: 8 mg/dL (ref 8–23)
CO2: 22 mmol/L (ref 22–32)
Calcium: 8.6 mg/dL — ABNORMAL LOW (ref 8.9–10.3)
Chloride: 103 mmol/L (ref 98–111)
Creatinine, Ser: 0.67 mg/dL (ref 0.61–1.24)
GFR, Estimated: >60 mL/min
Glucose, Bld: 82 mg/dL (ref 70–99)
Potassium: 3.7 mmol/L (ref 3.5–5.1)
Sodium: 136 mmol/L (ref 135–145)
Total Bilirubin: 1.6 mg/dL — ABNORMAL HIGH (ref 0.0–1.2)
Total Protein: 6.4 g/dL — ABNORMAL LOW (ref 6.5–8.1)

## 2024-05-26 LAB — GLUCOSE, CAPILLARY
Glucose-Capillary: 91 mg/dL (ref 70–99)
Glucose-Capillary: 94 mg/dL (ref 70–99)
Glucose-Capillary: 95 mg/dL (ref 70–99)
Glucose-Capillary: 92 mg/dL (ref 70–99)
Glucose-Capillary: 86 mg/dL (ref 70–99)
Glucose-Capillary: 105 mg/dL — ABNORMAL HIGH (ref 70–99)

## 2024-05-26 LAB — PROTIME-INR
INR: 1.1 (ref 0.8–1.2)
Prothrombin Time: 15.1 s (ref 11.4–15.2)

## 2024-05-26 MED ORDER — SODIUM CHLORIDE 0.9 % IV SOLN
2.0000 g | INTRAVENOUS | Status: DC
  Filled 2024-05-26: qty 2

## 2024-05-26 NOTE — Progress Notes (Signed)
 " PROGRESS NOTE    Jeffery Sweeney  FMW:[id number] DOB: [birth year] DOA: 05/24/2024 PCP: Towana Small, FNP  Assessment & Plan:   Principal Problem:   Acute gallstone pancreatitis Active Problems:   History of acute cholecystitis s/p percutaneous insertion of cholecystostomy tube 04/29/2024   CAD with history of CABG/Stent   Common bile duct stone   Diabetes mellitus without complication (HCC)   Essential hypertension   History of CVA (cerebrovascular accident)  Assessment and Plan: Acute gallstone pancreatitis: s/p percutaneous cholecystostomy 04/29/2024 for acute cholecystitis. S/p ERCP in which bile duct could not be cannulated but a small stone did come out with manipulation of the ampulla as per GI on 05/25/24. Cholecystectomy is scheduled in Feb 2026 sometime and gen surg (Dr. Desiderio) will keep previously scheduled surg date  Choledocholithiasis: as per MRCP. S/p ERCP in which the bile duct could not be cannulated but a small stone did come out with manipulation of the ampulla as per GI on 05/25/24.    Hx of CVA: continue on statin, aspirin     HTN: continue on home dose of metoprolol     DM2: well controlled, HbA1c 6.6. Continue on SSI w/ accuchecks   Bed bugs: s/p premethrin. Continue w/ contact precautions   Hx of CAD: w/ hx of CABG and stent. Continue on imdur , metoprolol , statin, aspirin    Obesity: BMI 31.6. Would benefit from weight loss    DVT prophylaxis: SCDs Code Status: full  Family Communication:  Disposition Plan: likely d/c back home   Level of care: Med-Surg Consultants:  GI   Procedures:  Antimicrobials:   Subjective: Pt c/o sore throat.  Objective: Vitals:   05/25/24 1637 05/25/24 2003 05/26/24 0354 05/26/24 0744  BP: 103/73 111/75 118/64 104/87  Pulse: 85 76 77 75  Resp: 18 16 16 17   Temp: 97.6 F (36.4 C) 97.8 F (36.6 C) 99.4 F (37.4 C) 98.1 F (36.7 C)  TempSrc: Oral   Oral  SpO2: 95% 96% 94% 96%  Weight:      Height:         Intake/Output Summary (Last 24 hours) at 05/26/2024 1026 Last data filed at 05/26/2024 0836 Gross per 24 hour  Intake 700 ml  Output 425 ml  Net 275 ml   Filed Weights   05/24/24 1833  Weight: 81 kg    Examination:  General exam: appears comfortable   Respiratory system: Clear breath sounds b/l  Cardiovascular system: S1/S2+. No rubs or clicks  Gastrointestinal system: Abd is soft, NT, obese & hypoactive bowel sounds  Central nervous system: alert & oriented. Moves all extremities  Psychiatry: Judgement and insight appears at baseline. Flat mood and affect     Data Reviewed: I have personally reviewed following labs and imaging studies  CBC: Recent Labs  Lab 05/24/24 1841 05/25/24 0426 05/26/24 0508  WBC 9.9 7.3 6.0  HGB 13.7 12.8* 12.2*  HCT 41.6 38.6* 36.3*  MCV 88.1 89.1 87.3  PLT 257 177 184   Basic Metabolic Panel: Recent Labs  Lab 05/24/24 1841 05/25/24 0426 05/26/24 0508  NA 135 139 136  K 3.7 4.0 3.7  CL 101 104 103  CO2 22 24 22   GLUCOSE 113* 99 82  BUN 6* <5* 8  CREATININE 0.86 0.80 0.67  CALCIUM  8.7* 8.2* 8.6*   GFR: Estimated Creatinine Clearance: 87.7 mL/min (by C-G formula based on SCr of 0.67 mg/dL). Liver Function Tests: Recent Labs  Lab 05/24/24 1841 05/25/24 0426 05/26/24 0508  AST 504* 304* 85*  ALT 201* 219* 122*  ALKPHOS 130* 129* 120  BILITOT 1.7* 1.6* 1.6*  PROT 7.1 6.4* 6.4*  ALBUMIN  3.7 3.3* 3.2*   Recent Labs  Lab 05/24/24 1841 05/25/24 0426  LIPASE 1,518* 161*   No results for input(s): AMMONIA in the last 168 hours. Coagulation Profile: No results for input(s): INR, PROTIME in the last 168 hours. Cardiac Enzymes: No results for input(s): CKTOTAL, CKMB, CKMBINDEX, TROPONINI in the last 168 hours. BNP (last 3 results) No results for input(s): PROBNP in the last 8760 hours. HbA1C: No results for input(s): HGBA1C in the last 72 hours. CBG: Recent Labs  Lab 05/25/24 1635 05/25/24 1959  05/25/24 2349 05/26/24 0355 05/26/24 0819  GLUCAP 117* 97 152* 91 94   Lipid Profile: No results for input(s): CHOL, HDL, LDLCALC, TRIG, CHOLHDL, LDLDIRECT in the last 72 hours. Thyroid  Function Tests: No results for input(s): TSH, T4TOTAL, FREET4, T3FREE, THYROIDAB in the last 72 hours. Anemia Panel: No results for input(s): VITAMINB12, FOLATE, FERRITIN, TIBC, IRON, RETICCTPCT in the last 72 hours. Sepsis Labs: No results for input(s): PROCALCITON, LATICACIDVEN in the last 168 hours.  No results found for this or any previous visit (from the past 240 hours).       Radiology Studies: DG C-Arm 1-60 Min-No Report Result Date: 05/25/2024 Fluoroscopy was utilized by the requesting physician.  No radiographic interpretation.   MR ABDOMEN MRCP W WO CONTRAST Result Date: 05/25/2024 EXAM: MRCP WITH AND WITHOUT IV CONTRAST 05/25/2024 09:06:41 AM TECHNIQUE: Multisequence, multiplanar magnetic resonance images of the abdomen with and without intravenous contrast. MRCP sequences were performed. 7.5 mL Gadavis was administered intravenously. COMPARISON: None available. CLINICAL HISTORY: Cholelithiasis. Concern for gallstone pancreatitis. Abdominal pain. Cholecystostomy tube placed 04/29/2024. FINDINGS: LIVER: Unremarkable. GALLBLADDER AND BILIARY SYSTEM: Cholecystostomy tube with tip within the lumen of the gallbladder. The gallbladder is fluid filled but nondistended measuring 2 cm in diameter. The common hepatic duct measures 7 mm. The common bile duct measures 7 mm, consistent with minimal dilatation. Tiny signal abnormality of the distal common bile duct is noted as a hyperintensity on diffusion weighted imaging just above the ampulla on image 52 series 11 and a hypointensity on T2 weighted imaging (image 20 series 4). There is some motion degradation on the MRCP sequences, With potential distal stone seen on image 14 of series 5. No intrahepatic biliary ductal  dilatation. SPLEEN: Unremarkable. PANCREAS/PANCREATIC DUCT: Visualized pancreas is unremarkable. No pancreatic ductal dilatation. No pancreatic inflammation. No peripancreatic fluid collections. ADRENAL GLANDS: Unremarkable. KIDNEYS: Benign nonenhancing cysts of the left kidney. No follow up recommended. LYMPH NODES: No enlarged abdominal lymph nodes. VASCULATURE: Unremarkable. PERITONEUM: No ascites. ABDOMINAL WALL: No hernia. No mass. BOWEL: Grossly unremarkable. No bowel obstruction. BONES: No acute abnormality or worrisome osseous lesion. SOFT TISSUES: Unremarkable. MISCELLANEOUS: Unremarkable. IMPRESSION: 1. Minimal dilatation of the common bile duct (7 mm) with tiny signal abnormality in the distal common bile duct most consistent with a tiny distal CBC stone. 2. Cholecystostomy tube in place with tip within the lumen of the gallbladder, which is fluid filled but nondistended, measuring 2 cm in diameter. 3. No pancreatic ductal dilatation or inflammation. No peripancreatic fluid collections. Electronically signed by: Norleen Boxer MD 05/25/2024 09:38 AM EST RP Workstation: [id number]   MR 3D Recon At Scanner Result Date: 05/25/2024 EXAM: MRCP WITH AND WITHOUT IV CONTRAST 05/25/2024 09:06:41 AM TECHNIQUE: Multisequence, multiplanar magnetic resonance images of the abdomen with and without intravenous contrast. MRCP sequences were performed. 7.5 mL Gadavis was administered intravenously. COMPARISON: None  available. CLINICAL HISTORY: Cholelithiasis. Concern for gallstone pancreatitis. Abdominal pain. Cholecystostomy tube placed 04/29/2024. FINDINGS: LIVER: Unremarkable. GALLBLADDER AND BILIARY SYSTEM: Cholecystostomy tube with tip within the lumen of the gallbladder. The gallbladder is fluid filled but nondistended measuring 2 cm in diameter. The common hepatic duct measures 7 mm. The common bile duct measures 7 mm, consistent with minimal dilatation. Tiny signal abnormality of the distal common bile duct is  noted as a hyperintensity on diffusion weighted imaging just above the ampulla on image 52 series 11 and a hypointensity on T2 weighted imaging (image 20 series 4). There is some motion degradation on the MRCP sequences, With potential distal stone seen on image 14 of series 5. No intrahepatic biliary ductal dilatation. SPLEEN: Unremarkable. PANCREAS/PANCREATIC DUCT: Visualized pancreas is unremarkable. No pancreatic ductal dilatation. No pancreatic inflammation. No peripancreatic fluid collections. ADRENAL GLANDS: Unremarkable. KIDNEYS: Benign nonenhancing cysts of the left kidney. No follow up recommended. LYMPH NODES: No enlarged abdominal lymph nodes. VASCULATURE: Unremarkable. PERITONEUM: No ascites. ABDOMINAL WALL: No hernia. No mass. BOWEL: Grossly unremarkable. No bowel obstruction. BONES: No acute abnormality or worrisome osseous lesion. SOFT TISSUES: Unremarkable. MISCELLANEOUS: Unremarkable. IMPRESSION: 1. Minimal dilatation of the common bile duct (7 mm) with tiny signal abnormality in the distal common bile duct most consistent with a tiny distal CBC stone. 2. Cholecystostomy tube in place with tip within the lumen of the gallbladder, which is fluid filled but nondistended, measuring 2 cm in diameter. 3. No pancreatic ductal dilatation or inflammation. No peripancreatic fluid collections. Electronically signed by: Norleen Boxer MD 05/25/2024 09:38 AM EST RP Workstation: [id number]   CT ABDOMEN PELVIS W CONTRAST Result Date: 05/24/2024 EXAM: CT ABDOMEN AND PELVIS WITH CONTRAST 05/24/2024 11:38:53 PM TECHNIQUE: CT of the abdomen and pelvis was performed with the administration of 100 mL of iohexol  (OMNIPAQUE ) 300 MG/ML solution. Multiplanar reformatted images are provided for review. Automated exposure control, iterative reconstruction, and/or weight-based adjustment of the mA/kV was utilized to reduce the radiation dose to as low as reasonably achievable. COMPARISON: Radiopaque biliary scan 04/28/2024,  ultrasound abdomen 04/27/2024, CT abdomen and pelvis 06/09/2023. CLINICAL HISTORY: Abdominal pain, acute (Ped 0-17y); epigastric pain, pancreatitis by labs, chole tube in place. FINDINGS: LOWER CHEST: Atelectasis in the lung bases. LIVER: Diffuse fatty infiltration of the liver. GALLBLADDER AND BILE DUCTS: Cholecystostomy tube in place. Mild gallbladder wall thickening and edema. No radiopaque stones identified. No bile duct dilatation. SPLEEN: The spleen is unremarkable. PANCREAS: The pancreas is unremarkable. ADRENAL GLANDS: The adrenal glands are unremarkable. KIDNEYS, URETERS AND BLADDER: 5.2 cm cyst on the left kidney, unchanged. No imaging follow-up is indicated. 2 mm stone in the lower pole of the left kidney. No hydronephrosis or hydroureter. No ureteral stones are seen. The bladder is normal. GI AND BOWEL: Diverticulosis of the sigmoid colon without evidence of acute diverticulitis. The stomach, small bowel, and colon are not abnormally distended. No wall thickening or inflammatory stranding. The appendix is normal. PERITONEUM AND RETROPERITONEUM: No ascites. No free air. VASCULATURE: Aorta is normal in caliber. LYMPH NODES: No lymphadenopathy. REPRODUCTIVE ORGANS: The prostate gland is not enlarged. BONES AND SOFT TISSUES: Degenerative changes in the spine and hips. No acute osseous abnormality. No focal soft tissue abnormality. IMPRESSION: 1. No CT evidence of acute pancreatitis. 2. Mild gallbladder wall thickening and edema with cholecystostomy tube in place, without radiopaque stones or bile duct dilatation. 3. 2 mm stone in the lower pole of the left kidney, without hydronephrosis or hydroureter. 4. Sigmoid diverticulosis without evidence of acute diverticulitis.  Electronically signed by: Elsie Gravely MD 05/24/2024 11:43 PM EST RP Workstation: [id number]        Scheduled Meds:  aspirin   81 mg Oral Daily   feeding supplement  1 Container Oral TID BM   insulin  aspart  0-9 Units Subcutaneous  Q4H   isosorbide  mononitrate  60 mg Oral BID   metoprolol  tartrate  50 mg Oral BID   pantoprazole   40 mg Oral Daily   rosuvastatin   40 mg Oral Daily   Continuous Infusions:   LOS: 0 days      Anthony CHRISTELLA Pouch, MD [facility] Pager [phone]  If 7PM-7AM, please contact night-coverage [email or web address] 05/26/2024, 10:26 AM   "

## 2024-05-26 NOTE — Consult Note (Signed)
 "   Chief Complaint:  Calculus of Bile Duct without cholangitis   Procedure: Percutaneous Transhepatic Cholangiogram with Biliary Drain Placement  Referring Provider(s): Dr. JONETTA Copping  Supervising Physician: Karalee Beat  Patient Status: [facility] - In-pt  History of Present Illness: Jeffery Sweeney is a 64 y.o. male with a history of CVA, CAD s/p CABG and PCI with stent, recently hospitalized from 12/10-12/16 for acute cholecystitis. Patient was not deemed an appropriate surgical candidate and underwent uncomplicated percutaneous cholecystostomy placement on 12/12 in IR with Dr. JINNY Hall. Patient recovered without complication and was discharged home on Augmentin . He returned to the ED on 1/6 with complaints of worsening upper abdominal pain. Workup revealed elevated lipase with transaminitis and he was admitted for management of gallstone pancreatitis. MRCP on 1/7 demonstrated minimal dilatation of the CBD with abnormality in the distal CBD most consistent with a tiny distal CBD stone. GI was consulted and patient underwent ERCP with Dr. Copping. Unfortunately, the bile duct was unable to be cannulated, but a small stone was noted to pass out of the ampulla. IR has been consulted for possible PTC with biliary drain placement followed by hopeful stent placement with Dr. Copping next week. Case and imaging reviewed and approved by Dr. VEAR Karalee.  Patient resting in bed in no acute distress. Admits to some continued mild abdominal pain, primarily on his right side, but is otherwise without complaints. He denies any recent fevers/chills, nausea/vomiting, chest pain, or shortness of breath. Discussed drain placement with patient at bedside and patient is in agreement with plan. All questions and concerns answered at the bedside.   Patient is Full Code  Past Medical History:  Diagnosis Date   CAD (coronary artery disease)    a. PCI x 3 - [place] ; b. 06/2020 NSTEMI-->CABG x 3 (LIMA->LAD, VG->RI,  VG->RPDA); c. 09/2021 STEMI/PCI: LM nl, LAD 80p/m, D1 90, lat D1 90, D2 80, LCX 47m/d, OM2 80, RCA 34m, 30d, RPDA 90, VG->prox RCA->RPDA 30p, VG->D1 mild dzs, LIMA->LAD 80 (DES)-->complicated by acute closure-->DES x 7; d. 03/2022 MV: No isch/infarct, EF 54%.   CVA (cerebral vascular accident) (HCC) 06/2021   Small acute/subacute infarcts in the left occipital lobe and   Diastolic dysfunction    a. 10/2022 Echo: EF 55-60%, no rwma, GrI DD, nl RV size/fxn, mild MR/AI.   Hyperlipidemia LDL goal <70    started on Repatha  09/26/2021, continue Crestor  40mg  daily   Hypertension    Multiple vessel coronary artery disease    Myocardial infarct (HCC)    x 3; PCI with single stent placement for each (in [place] )   NSTEMI (non-ST elevated myocardial infarction) (HCC) 06/2020   STEMI (ST elevation myocardial infarction) (HCC) 09/23/2021   LHC 09/23/2021 x 2   Tobacco use    2 cigarettes daily   Unstable angina (HCC)    LHC 09/23/2021 x 2; total 8 DES to LIMA    Past Surgical History:  Procedure Laterality Date   CORONARY ANGIOPLASTY WITH STENT PLACEMENT     in [place]  in 2018-19   CORONARY ARTERY BYPASS GRAFT N/A 07/09/2020   Procedure: CORONARY ARTERY BYPASS GRAFTING (CABG) x THREE , USING LEFT INTERNAL MAMMARY ARTERY, AND RIGHT LEG GREATER SAPHENOUS VEIN HARVESTED ENDOSCOPICALLY;  Surgeon: Army Dallas NOVAK, MD;  Location: [facility] OR;  Service: Open Heart Surgery;  Laterality: N/A;   CORONARY PRESSURE/FFR STUDY N/A 07/06/2020   Procedure: INTRAVASCULAR PRESSURE WIRE/FFR STUDY;  Surgeon: Darron Deatrice LABOR, MD;  Location: [facility] INVASIVE CV LAB;  Service:  Cardiovascular;  Laterality: N/A;  LAD   CORONARY STENT INTERVENTION N/A 09/23/2021   Procedure: CORONARY STENT INTERVENTION;  Surgeon: Darron Deatrice LABOR, MD;  Location: [facility] INVASIVE CV LAB;  Service: Cardiovascular;  Laterality: N/A;   CORONARY/GRAFT ACUTE MI REVASCULARIZATION N/A 09/23/2021   Procedure: Coronary/Graft Acute MI Revascularization;   Surgeon: Darron Deatrice LABOR, MD;  Location: [facility] INVASIVE CV LAB;  Service: Cardiovascular;  Laterality: N/A;   ERCP N/A 05/25/2024   Procedure: ERCP, WITH INTERVENTION IF INDICATED;  Surgeon: Jinny Carmine, MD;  Location: [facility] ENDOSCOPY;  Service: Endoscopy;  Laterality: N/A;   IR PERC CHOLECYSTOSTOMY  04/29/2024   LEFT HEART CATH AND CORONARY ANGIOGRAPHY N/A 07/06/2020   Procedure: LEFT HEART CATH AND CORONARY ANGIOGRAPHY;  Surgeon: Darron Deatrice LABOR, MD;  Location: [facility] INVASIVE CV LAB;  Service: Cardiovascular;  Laterality: N/A;   LEFT HEART CATH AND CORONARY ANGIOGRAPHY N/A 09/23/2021   Procedure: LEFT HEART CATH AND CORONARY ANGIOGRAPHY;  Surgeon: Darron Deatrice LABOR, MD;  Location: [facility] INVASIVE CV LAB;  Service: Cardiovascular;  Laterality: N/A;   LEFT HEART CATH AND CORS/GRAFTS ANGIOGRAPHY N/A 09/23/2021   Procedure: LEFT HEART CATH AND CORS/GRAFTS ANGIOGRAPHY;  Surgeon: Darron Deatrice LABOR, MD;  Location: [facility] INVASIVE CV LAB;  Service: Cardiovascular;  Laterality: N/A;   TEE WITHOUT CARDIOVERSION N/A 07/09/2020   Procedure: TRANSESOPHAGEAL ECHOCARDIOGRAM (TEE);  Surgeon: Army Dallas NOVAK, MD;  Location: [facility] OR;  Service: Open Heart Surgery;  Laterality: N/A;   WRIST SURGERY Left    for cyst    Allergies: Patient has no known allergies.  Medications: Prior to Admission medications  Medication Sig Start Date End Date Taking? Authorizing Provider  acetaminophen  (TYLENOL ) 500 MG tablet Take 2 tablets (1,000 mg total) by mouth every 6 (six) hours as needed. 10/15/23 10/14/24 Yes Clarine Ozell LABOR, MD  aspirin  81 MG chewable tablet Chew 1 tablet (81 mg total) by mouth daily. 02/23/24  Yes Towana Small, FNP  clopidogrel  (PLAVIX ) 75 MG tablet Take 1 tablet (75 mg total) by mouth daily. 02/15/24  Yes Darron Deatrice LABOR, MD  isosorbide  mononitrate (IMDUR ) 30 MG 24 hr tablet Take 2 tablets (60 mg total) by mouth 2 (two) times daily. 02/23/24 05/25/24 Yes Towana Small, FNP  meclizine  (ANTIVERT ) 25 MG  tablet Take 2 tablets (50 mg total) by mouth 2 (two) times daily as needed for dizziness. 02/23/24  Yes Towana Small, FNP  metFORMIN  (GLUCOPHAGE -XR) 500 MG 24 hr tablet Take 2 tablets (1,000 mg total) by mouth 2 (two) times daily with a meal. 10/20/23  Yes Towana Small, FNP  metoprolol  tartrate (LOPRESSOR ) 50 MG tablet Take 1 tablet (50 mg total) by mouth 2 (two) times daily. 02/23/24  Yes Towana Small, FNP  nitroGLYCERIN  (NITROSTAT ) 0.4 MG SL tablet Place 1 tablet (0.4 mg total) under the tongue every 5 (five) minutes as needed for chest pain. 09/26/21  Yes Hammock, Sheri, NP  pantoprazole  (PROTONIX ) 20 MG tablet Take 2 tablets (40 mg total) by mouth daily. 02/23/24  Yes Butler, Kristina, FNP  rosuvastatin  (CRESTOR ) 40 MG tablet Take 1 tablet by mouth once daily 02/23/24  Yes Butler, Kristina, FNP  traZODone  (DESYREL ) 50 MG tablet Take 0.5-1 tablets (25-50 mg total) by mouth at bedtime as needed for sleep. 02/23/24  Yes Towana Small, FNP  albuterol  (VENTOLIN  HFA) 108 (90 Base) MCG/ACT inhaler Inhale 2 puffs into the lungs every 6 (six) hours as needed for wheezing or shortness of breath. Patient not taking: Reported on 05/25/2024 06/16/23   Towana Small, FNP  sodium chloride  flush 0.9 % SOLN injection 5 mLs by Intracatheter route daily. 05/02/24 07/01/24  Desiderio Schanz, MD  sodium chloride  flush 0.9 % SOLN injection 5 mLs by Intracatheter route daily. Flush cholecystostomy tube daily with 5 ccs NS 05/02/24 07/01/24  Schulz, Zachary R, PA-C     Family History  Problem Relation Age of Onset   Diabetes Mellitus II Father     Social History   Socioeconomic History   Marital status: Divorced    Spouse name: Not on file   Number of children: Not on file   Years of education: Not on file   Highest education level: Not on file  Occupational History   Not on file  Tobacco Use   Smoking status: Some Days    Current packs/day: 0.00    Average packs/day: 0.5 packs/day    Types: Cigarettes     Last attempt to quit: 07/06/2020    Years since quitting: 3.8    Passive exposure: Past   Smokeless tobacco: Never   Tobacco comments:    [employer] info given. 11/17/23  Vaping Use   Vaping status: Never Used  Substance and Sexual Activity   Alcohol use: Not Currently   Drug use: Not Currently   Sexual activity: Not Currently  Other Topics Concern   Not on file  Social History Narrative   Not on file   Social Drivers of Health   Tobacco Use: High Risk (05/25/2024)   Patient History    Smoking Tobacco Use: Some Days    Smokeless Tobacco Use: Never    Passive Exposure: Past  Financial Resource Strain: Not on file  Food Insecurity: Food Insecurity Present (05/25/2024)   Epic    Worried About Programme Researcher, Broadcasting/film/video in the Last Year: Sometimes true    [employer] of Food in the Last Year: Sometimes true  Transportation Needs: No Transportation Needs (05/25/2024)   Epic    Lack of Transportation (Medical): No    Lack of Transportation (Non-Medical): No  Physical Activity: Not on file  Stress: Not on file  Social Connections: Patient Declined (04/28/2024)   Social Connection and Isolation Panel    Frequency of Communication with Friends and Family: Patient declined    Frequency of Social Gatherings with Friends and Family: Patient declined    Attends Religious Services: Patient declined    Database Administrator or Organizations: Patient declined    Attends Banker Meetings: Patient declined    Marital Status: Patient declined  Depression (PHQ2-9): Low Risk (05/04/2024)   Depression (PHQ2-9)    PHQ-2 Score: 3  Alcohol Screen: Not on file  Housing: Low Risk (05/25/2024)   Epic    Unable to Pay for Housing in the Last Year: No    Number of Times Moved in the Last Year: 1    Homeless in the Last Year: No  Utilities: Not At Risk (05/25/2024)   Epic    Threatened with loss of utilities: No  Health Literacy: Not on file    Review of Systems  Gastrointestinal:  Positive for  abdominal pain (mild, worse on the right).  Patient denies any headache, chest pain, shortness of breath, N/V, or fever/chills. All other systems are negative.   Vital Signs: BP 104/87   Pulse 75   Temp 98.1 F (36.7 C) (Oral)   Resp 17   Ht 5' 3 (1.6 m)   Wt 178 lb 9.2 oz (81 kg)   SpO2 96%   BMI  31.63 kg/m    Physical Exam Vitals reviewed.  HENT:     Mouth/Throat:     Mouth: Mucous membranes are moist.     Pharynx: Oropharynx is clear.  Cardiovascular:     Rate and Rhythm: Normal rate and regular rhythm.     Heart sounds: Normal heart sounds.  Pulmonary:     Effort: Pulmonary effort is normal.     Breath sounds: Normal breath sounds.  Abdominal:     General: Abdomen is flat.     Palpations: Abdomen is soft.     Tenderness: There is abdominal tenderness (RUQ).     Comments: RUQ drain sutured and stat locked in place. Overlying dressing is clean and dry. Insertion site is non-erythematous. Gravity bag with ~50cc bilious output.   Skin:    General: Skin is warm and dry.  Neurological:     Mental Status: He is alert and oriented to person, place, and time.  Psychiatric:        Behavior: Behavior normal.     Imaging: DG C-Arm 1-60 Min-No Report Result Date: 05/25/2024 Fluoroscopy was utilized by the requesting physician.  No radiographic interpretation.   MR ABDOMEN MRCP W WO CONTRAST Result Date: 05/25/2024 EXAM: MRCP WITH AND WITHOUT IV CONTRAST 05/25/2024 09:06:41 AM TECHNIQUE: Multisequence, multiplanar magnetic resonance images of the abdomen with and without intravenous contrast. MRCP sequences were performed. 7.5 mL Gadavis was administered intravenously. COMPARISON: None available. CLINICAL HISTORY: Cholelithiasis. Concern for gallstone pancreatitis. Abdominal pain. Cholecystostomy tube placed 04/29/2024. FINDINGS: LIVER: Unremarkable. GALLBLADDER AND BILIARY SYSTEM: Cholecystostomy tube with tip within the lumen of the gallbladder. The gallbladder is fluid filled  but nondistended measuring 2 cm in diameter. The common hepatic duct measures 7 mm. The common bile duct measures 7 mm, consistent with minimal dilatation. Tiny signal abnormality of the distal common bile duct is noted as a hyperintensity on diffusion weighted imaging just above the ampulla on image 52 series 11 and a hypointensity on T2 weighted imaging (image 20 series 4). There is some motion degradation on the MRCP sequences, With potential distal stone seen on image 14 of series 5. No intrahepatic biliary ductal dilatation. SPLEEN: Unremarkable. PANCREAS/PANCREATIC DUCT: Visualized pancreas is unremarkable. No pancreatic ductal dilatation. No pancreatic inflammation. No peripancreatic fluid collections. ADRENAL GLANDS: Unremarkable. KIDNEYS: Benign nonenhancing cysts of the left kidney. No follow up recommended. LYMPH NODES: No enlarged abdominal lymph nodes. VASCULATURE: Unremarkable. PERITONEUM: No ascites. ABDOMINAL WALL: No hernia. No mass. BOWEL: Grossly unremarkable. No bowel obstruction. BONES: No acute abnormality or worrisome osseous lesion. SOFT TISSUES: Unremarkable. MISCELLANEOUS: Unremarkable. IMPRESSION: 1. Minimal dilatation of the common bile duct (7 mm) with tiny signal abnormality in the distal common bile duct most consistent with a tiny distal CBC stone. 2. Cholecystostomy tube in place with tip within the lumen of the gallbladder, which is fluid filled but nondistended, measuring 2 cm in diameter. 3. No pancreatic ductal dilatation or inflammation. No peripancreatic fluid collections. Electronically signed by: Norleen Boxer MD 05/25/2024 09:38 AM EST RP Workstation: [id number]   MR 3D Recon At Scanner Result Date: 05/25/2024 EXAM: MRCP WITH AND WITHOUT IV CONTRAST 05/25/2024 09:06:41 AM TECHNIQUE: Multisequence, multiplanar magnetic resonance images of the abdomen with and without intravenous contrast. MRCP sequences were performed. 7.5 mL Gadavis was administered intravenously.  COMPARISON: None available. CLINICAL HISTORY: Cholelithiasis. Concern for gallstone pancreatitis. Abdominal pain. Cholecystostomy tube placed 04/29/2024. FINDINGS: LIVER: Unremarkable. GALLBLADDER AND BILIARY SYSTEM: Cholecystostomy tube with tip within the lumen of the gallbladder. The gallbladder is  fluid filled but nondistended measuring 2 cm in diameter. The common hepatic duct measures 7 mm. The common bile duct measures 7 mm, consistent with minimal dilatation. Tiny signal abnormality of the distal common bile duct is noted as a hyperintensity on diffusion weighted imaging just above the ampulla on image 52 series 11 and a hypointensity on T2 weighted imaging (image 20 series 4). There is some motion degradation on the MRCP sequences, With potential distal stone seen on image 14 of series 5. No intrahepatic biliary ductal dilatation. SPLEEN: Unremarkable. PANCREAS/PANCREATIC DUCT: Visualized pancreas is unremarkable. No pancreatic ductal dilatation. No pancreatic inflammation. No peripancreatic fluid collections. ADRENAL GLANDS: Unremarkable. KIDNEYS: Benign nonenhancing cysts of the left kidney. No follow up recommended. LYMPH NODES: No enlarged abdominal lymph nodes. VASCULATURE: Unremarkable. PERITONEUM: No ascites. ABDOMINAL WALL: No hernia. No mass. BOWEL: Grossly unremarkable. No bowel obstruction. BONES: No acute abnormality or worrisome osseous lesion. SOFT TISSUES: Unremarkable. MISCELLANEOUS: Unremarkable. IMPRESSION: 1. Minimal dilatation of the common bile duct (7 mm) with tiny signal abnormality in the distal common bile duct most consistent with a tiny distal CBC stone. 2. Cholecystostomy tube in place with tip within the lumen of the gallbladder, which is fluid filled but nondistended, measuring 2 cm in diameter. 3. No pancreatic ductal dilatation or inflammation. No peripancreatic fluid collections. Electronically signed by: Norleen Boxer MD 05/25/2024 09:38 AM EST RP Workstation: [id number]    CT ABDOMEN PELVIS W CONTRAST Result Date: 05/24/2024 EXAM: CT ABDOMEN AND PELVIS WITH CONTRAST 05/24/2024 11:38:53 PM TECHNIQUE: CT of the abdomen and pelvis was performed with the administration of 100 mL of iohexol  (OMNIPAQUE ) 300 MG/ML solution. Multiplanar reformatted images are provided for review. Automated exposure control, iterative reconstruction, and/or weight-based adjustment of the mA/kV was utilized to reduce the radiation dose to as low as reasonably achievable. COMPARISON: Radiopaque biliary scan 04/28/2024, ultrasound abdomen 04/27/2024, CT abdomen and pelvis 06/09/2023. CLINICAL HISTORY: Abdominal pain, acute (Ped 0-17y); epigastric pain, pancreatitis by labs, chole tube in place. FINDINGS: LOWER CHEST: Atelectasis in the lung bases. LIVER: Diffuse fatty infiltration of the liver. GALLBLADDER AND BILE DUCTS: Cholecystostomy tube in place. Mild gallbladder wall thickening and edema. No radiopaque stones identified. No bile duct dilatation. SPLEEN: The spleen is unremarkable. PANCREAS: The pancreas is unremarkable. ADRENAL GLANDS: The adrenal glands are unremarkable. KIDNEYS, URETERS AND BLADDER: 5.2 cm cyst on the left kidney, unchanged. No imaging follow-up is indicated. 2 mm stone in the lower pole of the left kidney. No hydronephrosis or hydroureter. No ureteral stones are seen. The bladder is normal. GI AND BOWEL: Diverticulosis of the sigmoid colon without evidence of acute diverticulitis. The stomach, small bowel, and colon are not abnormally distended. No wall thickening or inflammatory stranding. The appendix is normal. PERITONEUM AND RETROPERITONEUM: No ascites. No free air. VASCULATURE: Aorta is normal in caliber. LYMPH NODES: No lymphadenopathy. REPRODUCTIVE ORGANS: The prostate gland is not enlarged. BONES AND SOFT TISSUES: Degenerative changes in the spine and hips. No acute osseous abnormality. No focal soft tissue abnormality. IMPRESSION: 1. No CT evidence of acute pancreatitis. 2.  Mild gallbladder wall thickening and edema with cholecystostomy tube in place, without radiopaque stones or bile duct dilatation. 3. 2 mm stone in the lower pole of the left kidney, without hydronephrosis or hydroureter. 4. Sigmoid diverticulosis without evidence of acute diverticulitis. Electronically signed by: Elsie Gravely MD 05/24/2024 11:43 PM EST RP Workstation: [id number]   IR Perc Cholecystostomy Result Date: 04/29/2024 INDICATION: Acute cholecystitis. EXAM: ULTRASOUND AND FLUOROSCOPIC-GUIDED CHOLECYSTOSTOMY TUBE PLACEMENT COMPARISON:  NM  HIDA, 04/28/2024.  US  Abdomen, 04/27/2024. MEDICATIONS: The patient is currently admitted to the hospital and on intravenous antibiotics. Antibiotics were administered within an appropriate time frame prior to skin puncture. 0.5 mg Dilaudid  IV and 25 mg Demerol  IV ANESTHESIA/SEDATION: Moderate (conscious) sedation was employed during this procedure. A total of Versed  1 mg and Fentanyl  100 mcg was administered intravenously. Moderate Sedation Time: 21 minutes. The patient's level of consciousness and vital signs were monitored continuously by radiology nursing throughout the procedure under my direct supervision. CONTRAST:  10mL OMNIPAQUE  IOHEXOL  300 MG/ML SOLN - administered into the gallbladder fossa. FLUOROSCOPY TIME:  Fluoroscopic dose; 12.6 mGy COMPLICATIONS: None immediate. PROCEDURE: Informed written consent was obtained from the patient after a discussion of the risks, benefits and alternatives to treatment. Questions regarding the procedure were encouraged and answered. A timeout was performed prior to the initiation of the procedure. The right upper abdominal quadrant was prepped and draped in the usual sterile fashion, and a sterile drape was applied covering the operative field. Maximum barrier sterile technique with sterile gowns and gloves were used for the procedure. A timeout was performed prior to the initiation of the procedure. Local anesthesia was  provided with 1% lidocaine  with epinephrine . Ultrasound scanning of the RIGHT upper quadrant demonstrates a distended gallbladder. Of note, the patient reported pain with ultrasound imaging over the gallbladder. Utilizing a transhepatic approach, a 22 gauge needle was advanced into the gallbladder under direct ultrasound guidance. An ultrasound image was saved for documentation purposes. Appropriate intraluminal puncture was confirmed with the efflux of bile and advancement of an 0.018 wire into the gallbladder lumen. The needle was exchanged for an Accustick set. A small amount of contrast was injected to confirm appropriate intraluminal positioning. Over a Benson wire, a 10 Fr cholecystomy tube was advanced into the gallbladder fossa, coiled and locked. Bile was aspirated and a small amount of contrast was injected as several post procedural spot radiographic images were obtained in various obliquities. The catheter was secured to the skin with suture, connected to a drainage bag and a dressing was placed. The patient tolerated the procedure well without immediate post procedural complication. IMPRESSION: Successful ultrasound and fluoroscopic guided placement of a 10 Fr cholecystostomy tube. The patient developed rigors acutely s/p drain placement. This was managed with medications, and his primary team was notified. RECOMMENDATIONS: The patient will return to Vascular Interventional Radiology (VIR) for routine drainage catheter evaluation and exchange in 6-8 weeks. Thom Hall, MD [facility] Electronically Signed   By: Thom Hall M.D.   On: 04/29/2024 14:56   NM Hepatobiliary Liver Func Result Date: 04/28/2024 EXAM: NM HEPATOBILLARY SCAN 04/28/2024 03:49:19 PM TECHNIQUE: RADIOPHARMACEUTICAL: 5.38 mCi Tc-97m mebrofenin  Dynamic images of the abdomen and pelvis were obtained in the anterior projection for 1 hour after intravenous administration of  radiopharmaceutical. 3 mg IV morphine  was administered and imaging continued for at least 30 minutes. COMPARISON: Ultrasound. CLINICAL HISTORY: RUQ abdominal pain, biliary disease suspected, US  nondiagnostic. FINDINGS: Homogenous uptake within the liver. Normal clearance of the blood pool. Appropriate excretion into the biliary system. Non-filling of the gallbladder at 60 minutes. The gallbladder failed to fill after morphine  augmentation. Activity is visualized in the small bowel. IMPRESSION: 1. Non-filling of the gallbladder after morphine  augmentation concerning for cystic duct obstruction / acute cholecystitis. 2. Patent common bile duct . 3. these results will be called to the ordering clinician or representative by the radiologist assistant, and communication documented in the pacs  or clario Advice Worker signed by: Norleen Boxer MD 04/28/2024 03:57 PM EST RP Workstation: [id number]   US  Abdomen Limited RUQ (LIVER/GB) Result Date: 04/27/2024 EXAM: Right Upper Quadrant Abdominal Ultrasound 04/27/2024 10:10:00 PM TECHNIQUE: Real-time ultrasonography of the right upper quadrant of the abdomen was performed. COMPARISON: 12/27/2021 and CT 06/09/2023. CLINICAL HISTORY: RUQ abdominal pain. FINDINGS: LIVER: Diffuse fatty infiltration of the liver. No intrahepatic biliary ductal dilatation. No evidence of mass. BILIARY SYSTEM: Several small layering gallstones measuring up to 4 mm. Sludge noted within the gallbladder. No wall thickening or sonographic cholelithiasis. Common bile duct borderline dilated at 8 mm. The distal bile duct is not visualized due to overlying bowel gas. Common bile duct size is stable since prior CT. No pericholecystic fluid. OTHER: No right upper quadrant ascites. IMPRESSION: 1. Cholelithiasis without sonographic evidence of acute cholecystitis. 2. Borderline dilated common bile duct measuring 8 mm, unchanged from prior CT. Distal duct not visualized due to overlying bowel gas. 3.  Diffuse hepatic steatosis. Electronically signed by: Franky Crease MD 04/27/2024 10:32 PM EST RP Workstation: [id number]    Labs:  CBC: Recent Labs    05/01/24 0449 05/24/24 1841 05/25/24 0426 05/26/24 0508  WBC 8.7 9.9 7.3 6.0  HGB 11.8* 13.7 12.8* 12.2*  HCT 36.3* 41.6 38.6* 36.3*  PLT 197 257 177 184    COAGS: Recent Labs    04/28/24 1656  INR 1.1    BMP: Recent Labs    04/30/24 1003 05/24/24 1841 05/25/24 0426 05/26/24 0508  NA 137 135 139 136  K 3.7 3.7 4.0 3.7  CL 103 101 104 103  CO2 24 22 24 22   GLUCOSE 159* 113* 99 82  BUN 21 6* <5* 8  CALCIUM  8.8* 8.7* 8.2* 8.6*  CREATININE 0.93 0.86 0.80 0.67  GFRNONAA >60 >60 >60 >60    LIVER FUNCTION TESTS: Recent Labs    04/29/24 0522 05/24/24 1841 05/25/24 0426 05/26/24 0508  BILITOT 2.7* 1.7* 1.6* 1.6*  AST 16 504* 304* 85*  ALT 10 201* 219* 122*  ALKPHOS 46 130* 129* 120  PROT 6.6 7.1 6.4* 6.4*  ALBUMIN  3.5 3.7 3.3* 3.2*    TUMOR MARKERS: No results for input(s): AFPTM, CEA, CA199, CHROMGRNA in the last 8760 hours.  Assessment and Plan:  Calculus of Bile Duct without Cholangitis: Jeffery Sweeney is a 64 y.o. male with a history of acute cholecystitis s/p percutaneous cholecystostomy in IR on 12/12 with Dr. Hughes. Patient presented to the ED on 1/6 with worsening abdominal pain. Workup concerning for possible gallstone pancreatitis. Patient underwent ERCP with Dr. Jinny who was unable to cannulate the bile duct, but was able to dislodge a small stone. IR consulted for possible biliary drain placement which was approved by Dr. VEAR Lent. Procedure to be performed under moderate sedation.  -NPO at midnight -On Plavix ; last dose on 1/6 prior to arrival -INR pending -Antibiotics ordered pre-procedure -Plan for PTC with possible drain placement in IR on 1/9 pending schedule availability   Risks and benefits of PTC with biliary drain placement discussed with the patient including, but  not limited to bleeding, infection which may lead to sepsis or even death and damage to adjacent structures.  This interventional procedure involves the use of X-rays and because of the nature of the planned procedure, it is possible that we will have prolonged use of X-ray fluoroscopy.  Potential radiation risks to you include (but are not limited to) the following: - A slightly elevated risk for cancer  several years later in life. This risk is typically less than 0.5% percent. This risk is low in comparison to the normal incidence of human cancer, which is 33% for women and 50% for men according to the [employer]. - Radiation induced injury can include skin redness, resembling a rash, tissue breakdown / ulcers and hair loss (which can be temporary or permanent).   The likelihood of either of these occurring depends on the difficulty of the procedure and whether you are sensitive to radiation due to previous procedures, disease, or genetic conditions.   IF your procedure requires a prolonged use of radiation, you will be notified and given written instructions for further action.  It is your responsibility to monitor the irradiated area for the 2 weeks following the procedure and to notify your physician if you are concerned that you have suffered a radiation induced injury.    All of the patient's questions were answered, patient is agreeable to proceed.  Consent signed and in IR.  Thank you for allowing our service to participate in Jeffery Sweeney 's care.    Electronically Signed: Tobie Hellen M Taralyn Ferraiolo, PA-C   05/26/2024, 3:04 PM     I spent a total of 40 Minutes  in face to face in clinical consultation, greater than 50% of which was counseling/coordinating care for biliary drain placement.  "

## 2024-05-26 NOTE — Evaluation (Signed)
 Physical Therapy Evaluation Patient Details Name: Jeffery Sweeney MRN: [id number] DOB: Nov 27, 1960 Today's Date: 05/26/2024  History of Present Illness  presented to ER secondary to abdominal pain; admitted for management of acute gallstone pancreatitis, s/p ERCP 1/7.  Currently with cholecystostomy tube placed 12/12 during recent admission (12/10-16).  Cholecystectomy scheduled for February  Clinical Impression  Patient resting in bed upon arrival to room; alert and oriented, follows commands and agreeable to participation with session.  Does endorse indep ambulation to/from room since admission without difficulty.  Denies pain; voices comfort managing R drain with mobility efforts.  Denies pain at rest or with mobility; eager to participate/progress with session. Patient able to complete bed mobility indep; sit/stand, basic transfers and gait (200') without assist device, sup/mod indep.   Demonstrates reciprocal stepping pattern, fair trunk rotation and arm swing; fair cadence.  Completes head turns, changes of direction, obstacle negotiation without buckling, LOB or safety concern.  Appropriately self-manages cholecystostomy drain.  Mildly antalgic towards R during R LE loading, relates to history of R LE orthopedic injury (baseline for him) Appears to be at functional baseline; no acute PT needs identified at this time.  Will complete initial orders, but engage with mobility specialist team for continued mobility emphasis throughout remaining stay.  Please reconsult should needs change.      If plan is discharge home, recommend the following:     Can travel by private vehicle   Yes    Equipment Recommendations    Recommendations for Other Services       Functional Status Assessment Patient has not had a recent decline in their functional status     Precautions / Restrictions Precautions Precautions: None Restrictions Weight Bearing Restrictions Per Provider Order: No       Mobility  Bed Mobility Overal bed mobility: Modified Independent                  Transfers Overall transfer level: Needs assistance Equipment used: None Transfers: Sit to/from Stand Sit to Stand: Supervision                Ambulation/Gait Ambulation/Gait assistance: Supervision Gait Distance (Feet): 200 Feet Assistive device: None   Gait velocity: 10' walk time, 10 seconds     General Gait Details: reciprocal stepping pattern, fair trunk rotation and arm swing; fair cadence.  Completes head turns, changes of direction, obstacle negotiation without buckling, LOB or safety concern.  Appropriately self-manages cholecystostomy drain.  Mildly antalgic towards R during R LE loading, relates to history of R LE orthopedic injury (baseline for him)  Stairs            Wheelchair Mobility     Tilt Bed    Modified Rankin (Stroke Patients Only)       Balance Overall balance assessment: Modified Independent                                           Pertinent Vitals/Pain Pain Assessment Pain Assessment: No/denies pain    Home Living Family/patient expects to be discharged to:: Private residence Living Arrangements: Alone Available Help at Discharge: Family;Available PRN/intermittently Type of Home: House Home Access: Stairs to enter Entrance Stairs-Rails: Lawyer of Steps: 9 steps Alternate Level Stairs-Number of Steps: 9 steps to floor with bed/bathroom Home Layout: Multi-level        Prior Function Prior Level of Function :  Independent/Modified Independent;Driving;History of Falls (last six months)             Mobility Comments: IND ambulator without AD; + driving ADLs Comments: IND with ADL's and IADL's     Extremity/Trunk Assessment   Upper Extremity Assessment Upper Extremity Assessment: Overall WFL for tasks assessed    Lower Extremity Assessment Lower Extremity Assessment: Overall WFL  for tasks assessed (grossly at least 4/5 throughout; history of orthopedic injury/repair to R LE)       Communication   Communication Communication: No apparent difficulties    Cognition Arousal: Alert Behavior During Therapy: WFL for tasks assessed/performed   PT - Cognitive impairments: No apparent impairments                         Following commands: Intact       Cueing Cueing Techniques: Verbal cues     General Comments      Exercises Other Exercises Other Exercises: Standing at toilet for bladder management, sup/mod indep for standing balance.  Fair/good awarness of limits of stability; fair/good safety strategies as needed.   Assessment/Plan    PT Assessment Patient does not need any further PT services  PT Problem List Decreased activity tolerance;Decreased balance;Decreased mobility;Decreased safety awareness       PT Treatment Interventions DME instruction;Gait training;Stair training;Functional mobility training;Therapeutic activities;Therapeutic exercise;Balance training;Neuromuscular re-education;Patient/family education;Cognitive remediation    PT Goals (Current goals can be found in the Care Plan section)  Acute Rehab PT Goals Patient Stated Goal: to get better and go home PT Goal Formulation: All assessment and education complete, DC therapy Time For Goal Achievement: 05/26/24 Potential to Achieve Goals: Good    Frequency       Co-evaluation               AM-PAC PT 6 Clicks Mobility  Outcome Measure Help needed turning from your back to your side while in a flat bed without using bedrails?: None Help needed moving from lying on your back to sitting on the side of a flat bed without using bedrails?: None Help needed moving to and from a bed to a chair (including a wheelchair)?: None Help needed standing up from a chair using your arms (e.g., wheelchair or bedside chair)?: None Help needed to walk in hospital room?: None Help  needed climbing 3-5 steps with a railing? : A Little 6 Click Score: 23    End of Session   Activity Tolerance: Patient tolerated treatment well Patient left: in bed;with call bell/phone within reach Nurse Communication: Mobility status PT Visit Diagnosis: Muscle weakness (generalized) (M62.81)    Time: 8554-8541 PT Time Calculation (min) (ACUTE ONLY): 13 min   Charges:   PT Evaluation $PT Eval Low Complexity: 1 Low   PT General Charges $$ ACUTE PT VISIT: 1 Visit         Tammra Pressman H. Delores, PT, DPT, NCS 05/26/2024, 3:18 PM [phone]

## 2024-05-26 NOTE — Progress Notes (Unsigned)
 "    Office Visit    Patient Name: Jeffery Sweeney Date of Encounter: 05/26/2024  Primary Care Provider:  Towana Small, FNP Primary Cardiologist:  Deatrice Cage, MD  Cardiology APP:  [place] Mikey DEL, PA-C   Chief Complaint    64 y.o. male with a h/o CAD status post CABG x 3 and multiple PCI's, HTN, HL, stroke, and tobacco abuse, who presents for CAD and preoperative follow-up.  Past Medical History   Subjective   Past Medical History:  Diagnosis Date   CVA (cerebral vascular accident) (HCC) 06/2021   Small acute/subacute infarcts in the left occipital lobe and   Hyperlipidemia LDL goal <70    started on Repatha  09/26/2021, continue Crestor  40mg  daily   Hypertension    Multiple vessel coronary artery disease    Myocardial infarct (HCC)    x 3; PCI with single stent placement for each (in [place] )   NSTEMI (non-ST elevated myocardial infarction) (HCC)    LHC 2/18 with multivessel CAD and transfer to [facility] for CABG eval   STEMI (ST elevation myocardial infarction) (HCC) 09/23/2021   LHC 09/23/2021 x 2   Tobacco use    2 cigarettes daily   Unstable angina (HCC)    LHC 09/23/2021 x 2; total 8 DES to LIMA   Past Surgical History:  Procedure Laterality Date   CORONARY ANGIOPLASTY WITH STENT PLACEMENT     in [place]  in 2018-19   CORONARY ARTERY BYPASS GRAFT N/A 07/09/2020   Procedure: CORONARY ARTERY BYPASS GRAFTING (CABG) x THREE , USING LEFT INTERNAL MAMMARY ARTERY, AND RIGHT LEG GREATER SAPHENOUS VEIN HARVESTED ENDOSCOPICALLY;  Surgeon: Army Dallas NOVAK, MD;  Location: [facility] OR;  Service: Open Heart Surgery;  Laterality: N/A;   CORONARY PRESSURE/FFR STUDY N/A 07/06/2020   Procedure: INTRAVASCULAR PRESSURE WIRE/FFR STUDY;  Surgeon: Cage Deatrice LABOR, MD;  Location: [facility] INVASIVE CV LAB;  Service: Cardiovascular;  Laterality: N/A;  LAD   CORONARY STENT INTERVENTION N/A 09/23/2021   Procedure: CORONARY STENT INTERVENTION;  Surgeon: Cage Deatrice LABOR, MD;  Location: [facility]  INVASIVE CV LAB;  Service: Cardiovascular;  Laterality: N/A;   CORONARY/GRAFT ACUTE MI REVASCULARIZATION N/A 09/23/2021   Procedure: Coronary/Graft Acute MI Revascularization;  Surgeon: Cage Deatrice LABOR, MD;  Location: [facility] INVASIVE CV LAB;  Service: Cardiovascular;  Laterality: N/A;   IR PERC CHOLECYSTOSTOMY  04/29/2024   LEFT HEART CATH AND CORONARY ANGIOGRAPHY N/A 07/06/2020   Procedure: LEFT HEART CATH AND CORONARY ANGIOGRAPHY;  Surgeon: Cage Deatrice LABOR, MD;  Location: [facility] INVASIVE CV LAB;  Service: Cardiovascular;  Laterality: N/A;   LEFT HEART CATH AND CORONARY ANGIOGRAPHY N/A 09/23/2021   Procedure: LEFT HEART CATH AND CORONARY ANGIOGRAPHY;  Surgeon: Cage Deatrice LABOR, MD;  Location: [facility] INVASIVE CV LAB;  Service: Cardiovascular;  Laterality: N/A;   LEFT HEART CATH AND CORS/GRAFTS ANGIOGRAPHY N/A 09/23/2021   Procedure: LEFT HEART CATH AND CORS/GRAFTS ANGIOGRAPHY;  Surgeon: Cage Deatrice LABOR, MD;  Location: [facility] INVASIVE CV LAB;  Service: Cardiovascular;  Laterality: N/A;   TEE WITHOUT CARDIOVERSION N/A 07/09/2020   Procedure: TRANSESOPHAGEAL ECHOCARDIOGRAM (TEE);  Surgeon: Army Dallas NOVAK, MD;  Location: [facility] OR;  Service: Open Heart Surgery;  Laterality: N/A;   WRIST SURGERY Left    for cyst    Allergies  Allergies[1]     History of Present Illness      64 y.o. y/o male with above past medical history including CAD, hypertension, hyperlipidemia, stroke, tobacco abuse.  Cardiac history dates back to 2019, when he underwent PCI,  reportedly x 3, in [place] .  In February 2022, he presented was admitted to [facility] with non-STEMI and underwent diagnostic catheterization revealing severe three-vessel coronary artery disease.  Echo showed normal LV function.  He was transferred to [facility] underwent CABG x 3 with a LIMA to LAD, vein graft to the RPDA, and vein graft to the ramus intermedius.  In May 2022, he was readmitted with atypical chest pain.  A stress test was performed  in the outpatient setting in July 2022 and was normal.  In February 2023, he was admitted for TIA.  Echo showed an EF of 60 to 65% with grade 1 diastolic dysfunction and mild MR.  In May 2023 he was seen in the office following an episode of chest pain.  Decision was made to pursue diagnostic catheterization which was performed in May 2023 with finding of severe multivessel CAD as well as 80% ostial stenosis in the LIMA to the LAD with a hazy appearance.  When the LIMA was engaged, there was acute closure of the graft and aspiration thrombectomy and drug-eluting stent placement was performed.  He was placed on tirofiban  postprocedure and admitted but approximately 6 hours later, he developed recurrent chest pain with anterior ST segment elevation requiring emergent diagnostic catheterization with finding of abrupt closure of the LIMA to the LAD with evidence of a spiral dissection to the distal anastomosis.  Patient required placement of 7 overlapping drug-eluting stents.  He was maintained on tirofiban  for 18 hours postprocedure.  He had recurrent atypical chest pain in November 2023 and underwent stress testing which was negative for ischemia with normal LV function.  Most recent echo in June 2024 showed EF of 55 to 60% with grade 1 diastolic dysfunction, normal RV size/function, mild MR/AI, and aortic sclerosis without stenosis.   Jeffery Sweeney was last seen in cardiology clinic in January 2025, at which time he noted intermittent exertional dyspnea and chest/sternal soreness.  ECG was unchanged.  Symptoms were overall felt to be stable, dating back to his normal stress test, and he was medically managed. Objective   Home Medications    No current facility-administered medications for this visit.   No current outpatient medications on file.   Facility-Administered Medications Ordered in Other Visits  Medication Dose Route Frequency Provider Last Rate Last Admin   acetaminophen  (TYLENOL ) tablet 650  mg  650 mg Oral Q6H PRN Jinny Carmine, MD   650 mg at 05/26/24 9165   Or   acetaminophen  (TYLENOL ) suppository 650 mg  650 mg Rectal Q6H PRN Jinny Carmine, MD       albuterol  (PROVENTIL ) (2.5 MG/3ML) 0.083% nebulizer solution 2.5 mg  2.5 mg Nebulization Q2H PRN Jinny Carmine, MD       aspirin  chewable tablet 81 mg  81 mg Oral Daily Jinny Carmine, MD   81 mg at 05/26/24 9165   feeding supplement (BOOST / RESOURCE BREEZE) liquid 1 Container  1 Container Oral TID BM Jinny Carmine, MD   1 Container at 05/26/24 0836   HYDROcodone -acetaminophen  (NORCO/VICODIN) 5-325 MG per tablet 1-2 tablet  1-2 tablet Oral Q4H PRN Jinny Carmine, MD   2 tablet at 05/26/24 0517   insulin  aspart (novoLOG ) injection 0-9 Units  0-9 Units Subcutaneous Q4H Jinny Carmine, MD   2 Units at 05/26/24 0006   isosorbide  mononitrate (IMDUR ) 24 hr tablet 60 mg  60 mg Oral BID Jinny Carmine, MD   60 mg at 05/26/24 0834   LORazepam  (ATIVAN ) injection 1 mg  1 mg Intravenous Q4H PRN Jinny Carmine, MD       meclizine  (ANTIVERT ) tablet 50 mg  50 mg Oral BID PRN Jinny Carmine, MD       metoprolol  tartrate (LOPRESSOR ) tablet 50 mg  50 mg Oral BID Jinny Carmine, MD   50 mg at 05/26/24 9165   morphine  (PF) 2 MG/ML injection 2 mg  2 mg Intravenous Q2H PRN Jinny Carmine, MD   2 mg at 05/25/24 1745   nitroGLYCERIN  (NITROSTAT ) SL tablet 0.4 mg  0.4 mg Sublingual Q5 min PRN Jinny Carmine, MD       ondansetron  (ZOFRAN ) tablet 4 mg  4 mg Oral Q6H PRN Jinny Carmine, MD       Or   ondansetron  (ZOFRAN ) injection 4 mg  4 mg Intravenous Q6H PRN Jinny Carmine, MD       pantoprazole  (PROTONIX ) EC tablet 40 mg  40 mg Oral Daily Jinny Carmine, MD   40 mg at 05/26/24 0834   phenol (CHLORASEPTIC) mouth spray 1 spray  1 spray Mouth/Throat PRN Trudy Anthony HERO, MD   1 spray at 05/25/24 2335   rosuvastatin  (CRESTOR ) tablet 40 mg  40 mg Oral Daily Jinny Carmine, MD   40 mg at 05/26/24 9165   traZODone  (DESYREL ) tablet 25-50 mg  25-50 mg Oral QHS PRN Jinny Carmine, MD          Physical Exam    VS:  There were no vitals taken for this visit. , BMI There is no height or weight on file to calculate BMI.          GEN: Well nourished, well developed, in no acute distress. HEENT: normal. Neck: Supple, no JVD, carotid bruits, or masses. Cardiac: RRR, no murmurs, rubs, or gallops. No clubbing, cyanosis, edema.  Radials 2+/PT 2+ and equal bilaterally.  Respiratory:  Respirations regular and unlabored, clear to auscultation bilaterally. GI: Soft, nontender, nondistended, BS + x 4. MS: no deformity or atrophy. Skin: warm and dry, no rash. Neuro:  Strength and sensation are intact. Psych: Normal affect.  Accessory Clinical Findings    ECG personally reviewed by me today -    *** - no acute changes.  Lab Results  Component Value Date   WBC 6.0 05/26/2024   HGB 12.2 (L) 05/26/2024   HCT 36.3 (L) 05/26/2024   MCV 87.3 05/26/2024   PLT 184 05/26/2024   Lab Results  Component Value Date   CREATININE 0.67 05/26/2024   BUN 8 05/26/2024   NA 136 05/26/2024   K 3.7 05/26/2024   CL 103 05/26/2024   CO2 22 05/26/2024   Lab Results  Component Value Date   ALT 122 (H) 05/26/2024   AST 85 (H) 05/26/2024   ALKPHOS 120 05/26/2024   BILITOT 1.6 (H) 05/26/2024   Lab Results  Component Value Date   CHOL 143 06/16/2023   HDL 32 (L) 06/16/2023   LDLCALC 75 06/16/2023   TRIG 217 (H) 06/16/2023   CHOLHDL 4.5 06/16/2023    Lab Results  Component Value Date   HGBA1C 6.6 (H) 04/30/2024   Lab Results  Component Value Date   TSH 2.229 11/22/2020       Assessment & Plan    1.  ***  Lonni Meager, NP 05/26/2024, 10:14 AM     [1] No Known Allergies  "

## 2024-05-26 NOTE — Progress Notes (Signed)
 "  Ruel Kung , MD [address], [place], [place], [address] Phone: [phone] Fax: [phone]   Mohd. Derflinger is being followed for CBD stones   Subjective: Doing wll having lunch no pain , only complaints has a sore throat after procedure   Objective: Vital signs in last 24 hours: Vitals:   05/25/24 1637 05/25/24 2003 05/26/24 0354 05/26/24 0744  BP: 103/73 111/75 118/64 104/87  Pulse: 85 76 77 75  Resp: 18 16 16 17   Temp: 97.6 F (36.4 C) 97.8 F (36.6 C) 99.4 F (37.4 C) 98.1 F (36.7 C)  TempSrc: Oral   Oral  SpO2: 95% 96% 94% 96%  Weight:      Height:       Weight change:   Intake/Output Summary (Last 24 hours) at 05/26/2024 1246 Last data filed at 05/26/2024 9163 Gross per 24 hour  Intake 700 ml  Output 425 ml  Net 275 ml     Exam:  Abdomen: soft, nontender, normal bowel sounds, drain has bilious material draining    Lab Results: @LABTEST2 @ Micro Results: No results found for this or any previous visit (from the past 240 hours). Studies/Results: DG C-Arm 1-60 Min-No Report Result Date: 05/25/2024 Fluoroscopy was utilized by the requesting physician.  No radiographic interpretation.   MR ABDOMEN MRCP W WO CONTRAST Result Date: 05/25/2024 EXAM: MRCP WITH AND WITHOUT IV CONTRAST 05/25/2024 09:06:41 AM TECHNIQUE: Multisequence, multiplanar magnetic resonance images of the abdomen with and without intravenous contrast. MRCP sequences were performed. 7.5 mL Gadavis was administered intravenously. COMPARISON: None available. CLINICAL HISTORY: Cholelithiasis. Concern for gallstone pancreatitis. Abdominal pain. Cholecystostomy tube placed 04/29/2024. FINDINGS: LIVER: Unremarkable. GALLBLADDER AND BILIARY SYSTEM: Cholecystostomy tube with tip within the lumen of the gallbladder. The gallbladder is fluid filled but nondistended measuring 2 cm in diameter. The common hepatic duct measures 7 mm. The common bile duct measures 7 mm, consistent with  minimal dilatation. Tiny signal abnormality of the distal common bile duct is noted as a hyperintensity on diffusion weighted imaging just above the ampulla on image 52 series 11 and a hypointensity on T2 weighted imaging (image 20 series 4). There is some motion degradation on the MRCP sequences, With potential distal stone seen on image 14 of series 5. No intrahepatic biliary ductal dilatation. SPLEEN: Unremarkable. PANCREAS/PANCREATIC DUCT: Visualized pancreas is unremarkable. No pancreatic ductal dilatation. No pancreatic inflammation. No peripancreatic fluid collections. ADRENAL GLANDS: Unremarkable. KIDNEYS: Benign nonenhancing cysts of the left kidney. No follow up recommended. LYMPH NODES: No enlarged abdominal lymph nodes. VASCULATURE: Unremarkable. PERITONEUM: No ascites. ABDOMINAL WALL: No hernia. No mass. BOWEL: Grossly unremarkable. No bowel obstruction. BONES: No acute abnormality or worrisome osseous lesion. SOFT TISSUES: Unremarkable. MISCELLANEOUS: Unremarkable. IMPRESSION: 1. Minimal dilatation of the common bile duct (7 mm) with tiny signal abnormality in the distal common bile duct most consistent with a tiny distal CBC stone. 2. Cholecystostomy tube in place with tip within the lumen of the gallbladder, which is fluid filled but nondistended, measuring 2 cm in diameter. 3. No pancreatic ductal dilatation or inflammation. No peripancreatic fluid collections. Electronically signed by: Norleen Boxer MD 05/25/2024 09:38 AM EST RP Workstation: [id number]   MR 3D Recon At Scanner Result Date: 05/25/2024 EXAM: MRCP WITH AND WITHOUT IV CONTRAST 05/25/2024 09:06:41 AM TECHNIQUE: Multisequence, multiplanar magnetic resonance images of the abdomen with and without intravenous contrast. MRCP sequences were performed. 7.5 mL Gadavis was administered intravenously. COMPARISON: None available. CLINICAL HISTORY: Cholelithiasis. Concern for gallstone pancreatitis. Abdominal pain.  Cholecystostomy tube placed  04/29/2024. FINDINGS: LIVER: Unremarkable. GALLBLADDER AND BILIARY SYSTEM: Cholecystostomy tube with tip within the lumen of the gallbladder. The gallbladder is fluid filled but nondistended measuring 2 cm in diameter. The common hepatic duct measures 7 mm. The common bile duct measures 7 mm, consistent with minimal dilatation. Tiny signal abnormality of the distal common bile duct is noted as a hyperintensity on diffusion weighted imaging just above the ampulla on image 52 series 11 and a hypointensity on T2 weighted imaging (image 20 series 4). There is some motion degradation on the MRCP sequences, With potential distal stone seen on image 14 of series 5. No intrahepatic biliary ductal dilatation. SPLEEN: Unremarkable. PANCREAS/PANCREATIC DUCT: Visualized pancreas is unremarkable. No pancreatic ductal dilatation. No pancreatic inflammation. No peripancreatic fluid collections. ADRENAL GLANDS: Unremarkable. KIDNEYS: Benign nonenhancing cysts of the left kidney. No follow up recommended. LYMPH NODES: No enlarged abdominal lymph nodes. VASCULATURE: Unremarkable. PERITONEUM: No ascites. ABDOMINAL WALL: No hernia. No mass. BOWEL: Grossly unremarkable. No bowel obstruction. BONES: No acute abnormality or worrisome osseous lesion. SOFT TISSUES: Unremarkable. MISCELLANEOUS: Unremarkable. IMPRESSION: 1. Minimal dilatation of the common bile duct (7 mm) with tiny signal abnormality in the distal common bile duct most consistent with a tiny distal CBC stone. 2. Cholecystostomy tube in place with tip within the lumen of the gallbladder, which is fluid filled but nondistended, measuring 2 cm in diameter. 3. No pancreatic ductal dilatation or inflammation. No peripancreatic fluid collections. Electronically signed by: Norleen Boxer MD 05/25/2024 09:38 AM EST RP Workstation: [id number]   CT ABDOMEN PELVIS W CONTRAST Result Date: 05/24/2024 EXAM: CT ABDOMEN AND PELVIS WITH CONTRAST 05/24/2024 11:38:53 PM TECHNIQUE: CT of the  abdomen and pelvis was performed with the administration of 100 mL of iohexol  (OMNIPAQUE ) 300 MG/ML solution. Multiplanar reformatted images are provided for review. Automated exposure control, iterative reconstruction, and/or weight-based adjustment of the mA/kV was utilized to reduce the radiation dose to as low as reasonably achievable. COMPARISON: Radiopaque biliary scan 04/28/2024, ultrasound abdomen 04/27/2024, CT abdomen and pelvis 06/09/2023. CLINICAL HISTORY: Abdominal pain, acute (Ped 0-17y); epigastric pain, pancreatitis by labs, chole tube in place. FINDINGS: LOWER CHEST: Atelectasis in the lung bases. LIVER: Diffuse fatty infiltration of the liver. GALLBLADDER AND BILE DUCTS: Cholecystostomy tube in place. Mild gallbladder wall thickening and edema. No radiopaque stones identified. No bile duct dilatation. SPLEEN: The spleen is unremarkable. PANCREAS: The pancreas is unremarkable. ADRENAL GLANDS: The adrenal glands are unremarkable. KIDNEYS, URETERS AND BLADDER: 5.2 cm cyst on the left kidney, unchanged. No imaging follow-up is indicated. 2 mm stone in the lower pole of the left kidney. No hydronephrosis or hydroureter. No ureteral stones are seen. The bladder is normal. GI AND BOWEL: Diverticulosis of the sigmoid colon without evidence of acute diverticulitis. The stomach, small bowel, and colon are not abnormally distended. No wall thickening or inflammatory stranding. The appendix is normal. PERITONEUM AND RETROPERITONEUM: No ascites. No free air. VASCULATURE: Aorta is normal in caliber. LYMPH NODES: No lymphadenopathy. REPRODUCTIVE ORGANS: The prostate gland is not enlarged. BONES AND SOFT TISSUES: Degenerative changes in the spine and hips. No acute osseous abnormality. No focal soft tissue abnormality. IMPRESSION: 1. No CT evidence of acute pancreatitis. 2. Mild gallbladder wall thickening and edema with cholecystostomy tube in place, without radiopaque stones or bile duct dilatation. 3. 2 mm  stone in the lower pole of the left kidney, without hydronephrosis or hydroureter. 4. Sigmoid diverticulosis without evidence of acute diverticulitis. Electronically signed by: Elsie Gravely MD 05/24/2024 11:43 PM  EST RP Workstation: [id number]   Medications: I have reviewed the patient's current medications. Scheduled Meds:  aspirin   81 mg Oral Daily   feeding supplement  1 Container Oral TID BM   insulin  aspart  0-9 Units Subcutaneous Q4H   isosorbide  mononitrate  60 mg Oral BID   metoprolol  tartrate  50 mg Oral BID   pantoprazole   40 mg Oral Daily   rosuvastatin   40 mg Oral Daily   Continuous Infusions: PRN Meds:.acetaminophen  **OR** acetaminophen , albuterol , HYDROcodone -acetaminophen , LORazepam , meclizine , morphine  injection, nitroGLYCERIN , ondansetron  **OR** ondansetron  (ZOFRAN ) IV, phenol, traZODone    Assessment: Principal Problem:   Acute gallstone pancreatitis Active Problems:   CAD with history of CABG/Stent   Common bile duct stone   Diabetes mellitus without complication (HCC)   Essential hypertension   History of CVA (cerebrovascular accident)   History of acute cholecystitis s/p percutaneous insertion of cholecystostomy tube 04/29/2024   [name] 64 y.o. male with a recent admission for acute cholecystitis with a cholecystostomy tube placed at that time.  The patient has a history of coronary artery disease and a history of a CABG and stents.  The patient was noted to have abnormal lipase and elevated liver enzymes and bilirubin with a CT scan not showing any signs of pancreatitis but the symptoms and the lipase are consistent with the patient's pancreatitis.  The patient underwent an MRCP that showed a abnormality consistent with filling defects in the distal CBD. Dr Jinny attempted ERCP yesterday but bile duct could not be cannulated - small stone did come out of ampulla and hence IR guided drain was recommended. Today WCC 6.0 down from 7.3, hb 12.2, Alk phos  , AST,ALT are all down trending . Clinically he feels well and no evidence of post ERCP issues.   I will sign off.  Please call me if any further GI concerns or questions.  We would like to thank you for the opportunity to participate in the care of Denorris Reust.   LOS: 0 days   Ruel Kung, MD 05/26/2024, 12:46 PM  "

## 2024-05-26 NOTE — Plan of Care (Signed)

## 2024-05-27 ENCOUNTER — Inpatient Hospital Stay: Payer: Self-pay | Admitting: Radiology

## 2024-05-27 ENCOUNTER — Encounter: Payer: Self-pay | Admitting: *Deleted

## 2024-05-27 HISTORY — PX: IR INT EXT BILIARY DRAIN WITH CHOLANGIOGRAM: IMG6044

## 2024-05-27 HISTORY — PX: IR EXCHANGE BILIARY DRAIN: IMG6046

## 2024-05-27 LAB — GLUCOSE, CAPILLARY
Glucose-Capillary: 110 mg/dL — ABNORMAL HIGH (ref 70–99)
Glucose-Capillary: 126 mg/dL — ABNORMAL HIGH (ref 70–99)
Glucose-Capillary: 129 mg/dL — ABNORMAL HIGH (ref 70–99)
Glucose-Capillary: 88 mg/dL (ref 70–99)
Glucose-Capillary: 92 mg/dL (ref 70–99)
Glucose-Capillary: 94 mg/dL (ref 70–99)

## 2024-05-27 LAB — CBC
HCT: 38 % — ABNORMAL LOW (ref 39.0–52.0)
Hemoglobin: 13.1 g/dL (ref 13.0–17.0)
MCH: 29.7 pg (ref 26.0–34.0)
MCHC: 34.5 g/dL (ref 30.0–36.0)
MCV: 86.2 fL (ref 80.0–100.0)
Platelets: 180 K/uL (ref 150–400)
RBC: 4.41 MIL/uL (ref 4.22–5.81)
RDW: 12.9 % (ref 11.5–15.5)
WBC: 4 K/uL (ref 4.0–10.5)
nRBC: 0 % (ref 0.0–0.2)

## 2024-05-27 LAB — COMPREHENSIVE METABOLIC PANEL WITH GFR
ALT: 88 U/L — ABNORMAL HIGH (ref 0–44)
AST: 67 U/L — ABNORMAL HIGH (ref 15–41)
Albumin: 3.5 g/dL (ref 3.5–5.0)
Alkaline Phosphatase: 149 U/L — ABNORMAL HIGH (ref 38–126)
Anion gap: 12 (ref 5–15)
BUN: 7 mg/dL — ABNORMAL LOW (ref 8–23)
CO2: 22 mmol/L (ref 22–32)
Calcium: 9 mg/dL (ref 8.9–10.3)
Chloride: 104 mmol/L (ref 98–111)
Creatinine, Ser: 0.73 mg/dL (ref 0.61–1.24)
GFR, Estimated: >60 mL/min
Glucose, Bld: 89 mg/dL (ref 70–99)
Potassium: 3.6 mmol/L (ref 3.5–5.1)
Sodium: 137 mmol/L (ref 135–145)
Total Bilirubin: 1.4 mg/dL — ABNORMAL HIGH (ref 0.0–1.2)
Total Protein: 7.1 g/dL (ref 6.5–8.1)

## 2024-05-27 MED ORDER — LIDOCAINE HCL 1 % IJ SOLN
10.0000 mL | Freq: Once | INTRAMUSCULAR | Status: AC
  Administered 2024-05-27: 10 mL via INTRADERMAL

## 2024-05-27 MED ORDER — MIDAZOLAM HCL (PF) 2 MG/2ML IJ SOLN
INTRAMUSCULAR | Status: AC | PRN
  Administered 2024-05-27 (×5): .5 mg via INTRAVENOUS
  Administered 2024-05-27: 1 mg via INTRAVENOUS
  Administered 2024-05-27 (×2): .5 mg via INTRAVENOUS

## 2024-05-27 MED ORDER — FENTANYL CITRATE (PF) 100 MCG/2ML IJ SOLN
INTRAMUSCULAR | Status: AC
  Filled 2024-05-27: qty 2

## 2024-05-27 MED ORDER — LIDOCAINE HCL 1 % IJ SOLN
INTRAMUSCULAR | Status: AC
  Filled 2024-05-27: qty 20

## 2024-05-27 MED ORDER — MIDAZOLAM HCL 2 MG/2ML IJ SOLN
INTRAMUSCULAR | Status: AC
  Filled 2024-05-27: qty 2

## 2024-05-27 MED ORDER — HYDROMORPHONE HCL 1 MG/ML IJ SOLN
1.0000 mg | INTRAMUSCULAR | Status: DC | PRN
  Administered 2024-05-27 – 2024-06-01 (×9): 1 mg via INTRAVENOUS
  Filled 2024-05-27 (×9): qty 1

## 2024-05-27 MED ORDER — FENTANYL CITRATE (PF) 100 MCG/2ML IJ SOLN
INTRAMUSCULAR | Status: AC | PRN
  Administered 2024-05-27 (×5): 25 ug via INTRAVENOUS
  Administered 2024-05-27: 50 ug via INTRAVENOUS
  Administered 2024-05-27: 25 ug via INTRAVENOUS

## 2024-05-27 MED ORDER — IOHEXOL 300 MG/ML  SOLN
50.0000 mL | Freq: Once | INTRAMUSCULAR | Status: AC | PRN
  Administered 2024-05-27: 50 mL

## 2024-05-27 MED ORDER — SODIUM CHLORIDE 0.9 % IV SOLN
2.0000 g | INTRAVENOUS | Status: AC
  Administered 2024-05-27: 2 g via INTRAVENOUS
  Filled 2024-05-27 (×2): qty 2

## 2024-05-27 NOTE — Plan of Care (Signed)
" °  Problem: Education: Goal: Ability to describe self-care measures that may prevent or decrease complications (Diabetes Survival Skills Education) will improve Outcome: Progressing   Problem: Coping: Goal: Ability to adjust to condition or change in health will improve Outcome: Progressing   Problem: Coping: Goal: Ability to adjust to condition or change in health will improve Outcome: Progressing   Problem: Fluid Volume: Goal: Ability to maintain a balanced intake and output will improve Outcome: Progressing   "

## 2024-05-27 NOTE — Progress Notes (Signed)
 OT Cancellation Note  Patient Details Name: Milam Allbaugh MRN: [id number] DOB: 03-Sep-1960   Cancelled Treatment:    Reason Eval/Treat Not Completed: OT screened, no needs identified, will sign off. Pt ambulating independently in room managing drain and endorses no difficulty with toileting, clothing mgt, and general ADL/mobility while hospitalized. Pt edu in role of OT and pt reports no indications for OT, OT in agreement. Will sign off.   Jaycey Gens R., MPH, MS, OTR/L ascom [phone] 05/27/2024, 8:58 AM

## 2024-05-27 NOTE — Progress Notes (Signed)
 " PROGRESS NOTE    Jeffery Sweeney  FMW:[id number] DOB: [birth year] DOA: 05/24/2024 PCP: Towana Small, FNP  Assessment & Plan:   Principal Problem:   Acute gallstone pancreatitis Active Problems:   History of acute cholecystitis s/p percutaneous insertion of cholecystostomy tube 04/29/2024   CAD with history of CABG/Stent   Common bile duct stone   Diabetes mellitus without complication (HCC)   Essential hypertension   History of CVA (cerebrovascular accident)  Assessment and Plan: Acute gallstone pancreatitis: s/p percutaneous cholecystostomy 04/29/2024 for acute cholecystitis. S/p ERCP in which bile duct could not be cannulated but a small stone did come out with manipulation of the ampulla as per GI on 05/25/24. Cholecystectomy is scheduled in Feb 2026 sometime and gen surg (Dr. Desiderio) will keep previously scheduled surg date. S/p percutaneous transhepatic cholangiogram w/ biliary drain placement. Drain is draining out blood currently   Choledocholithiasis: as per MRCP. S/p ERCP in which the bile duct could not be cannulated but a small stone did come out with manipulation of the ampulla as per GI on 05/25/24. S/p percutaneous transhepatic cholangiogram w/ biliary drain placement as per IR. Drain is draining out blood currently    Hx of CVA: continue on statin, aspirin    HTN: continue on home dose of metoprolol     DM2: well controlled, HbA1c 6.6. Continue on SSI w/ accuchecks   Bed bugs: s/p premethrin. Continue w/ contact precautions   Hx of CAD: w/ hx of CABG and stent. Continue on metoprolol , imdur , aspirin , statin. Hold anti-HTN meds for MAP < 65  Obesity: BMI 31.6. Would benefit from weight loss    DVT prophylaxis: SCDs Code Status: full  Family Communication:  Disposition Plan: likely d/c back home   Level of care: Med-Surg Consultants:  GI   Procedures:  Antimicrobials:   Subjective: Pt c/o abd pain   Objective: Vitals:   05/27/24 0930 05/27/24 0935  05/27/24 0940 05/27/24 0945  BP: (!) 166/105 (!) 154/85 (!) 162/94 (!) 150/80  Pulse: 73 62 70 74  Resp: 12 14 10 11   Temp:      TempSrc:      SpO2: 100% 97% 96% 96%  Weight:      Height:        Intake/Output Summary (Last 24 hours) at 05/27/2024 0948 Last data filed at 05/27/2024 0600 Gross per 24 hour  Intake 510 ml  Output 985 ml  Net -475 ml   Filed Weights   05/24/24 1833  Weight: 81 kg    Examination:  General exam: appears uncomfortable  Respiratory system: clear breath sounds b/l   Cardiovascular system: S1 & S2+. No rubs or clicks  Gastrointestinal system: Abd is soft, NT, obese & hypoactive bowel sounds. Drains in place  Central nervous system: alert & oriented. Moves all extremities Psychiatry: Judgement and insight appears at baseline. Flat mood and affect     Data Reviewed: I have personally reviewed following labs and imaging studies  CBC: Recent Labs  Lab 05/24/24 1841 05/25/24 0426 05/26/24 0508  WBC 9.9 7.3 6.0  HGB 13.7 12.8* 12.2*  HCT 41.6 38.6* 36.3*  MCV 88.1 89.1 87.3  PLT 257 177 184   Basic Metabolic Panel: Recent Labs  Lab 05/24/24 1841 05/25/24 0426 05/26/24 0508  NA 135 139 136  K 3.7 4.0 3.7  CL 101 104 103  CO2 22 24 22   GLUCOSE 113* 99 82  BUN 6* <5* 8  CREATININE 0.86 0.80 0.67  CALCIUM  8.7* 8.2* 8.6*  GFR: Estimated Creatinine Clearance: 87.7 mL/min (by C-G formula based on SCr of 0.67 mg/dL). Liver Function Tests: Recent Labs  Lab 05/24/24 1841 05/25/24 0426 05/26/24 0508  AST 504* 304* 85*  ALT 201* 219* 122*  ALKPHOS 130* 129* 120  BILITOT 1.7* 1.6* 1.6*  PROT 7.1 6.4* 6.4*  ALBUMIN  3.7 3.3* 3.2*   Recent Labs  Lab 05/24/24 1841 05/25/24 0426  LIPASE 1,518* 161*   No results for input(s): AMMONIA in the last 168 hours. Coagulation Profile: Recent Labs  Lab 05/26/24 1557  INR 1.1   Cardiac Enzymes: No results for input(s): CKTOTAL, CKMB, CKMBINDEX, TROPONINI in the last 168  hours. BNP (last 3 results) No results for input(s): PROBNP in the last 8760 hours. HbA1C: No results for input(s): HGBA1C in the last 72 hours. CBG: Recent Labs  Lab 05/26/24 1632 05/26/24 2057 05/27/24 0024 05/27/24 0503 05/27/24 0826  GLUCAP 86 105* 88 94 92   Lipid Profile: No results for input(s): CHOL, HDL, LDLCALC, TRIG, CHOLHDL, LDLDIRECT in the last 72 hours. Thyroid  Function Tests: No results for input(s): TSH, T4TOTAL, FREET4, T3FREE, THYROIDAB in the last 72 hours. Anemia Panel: No results for input(s): VITAMINB12, FOLATE, FERRITIN, TIBC, IRON, RETICCTPCT in the last 72 hours. Sepsis Labs: No results for input(s): PROCALCITON, LATICACIDVEN in the last 168 hours.  No results found for this or any previous visit (from the past 240 hours).       Radiology Studies: DG C-Arm 1-60 Min-No Report Result Date: 05/25/2024 Fluoroscopy was utilized by the requesting physician.  No radiographic interpretation.        Scheduled Meds:  aspirin   81 mg Oral Daily   feeding supplement  1 Container Oral TID BM   insulin  aspart  0-9 Units Subcutaneous Q4H   isosorbide  mononitrate  60 mg Oral BID   metoprolol  tartrate  50 mg Oral BID   pantoprazole   40 mg Oral Daily   rosuvastatin   40 mg Oral Daily   Continuous Infusions:  cefOXitin  2 g (05/27/24 0929)     LOS: 1 day      Anthony CHRISTELLA Pouch, MD [facility] Pager [phone]  If 7PM-7AM, please contact night-coverage [email or web address] 05/27/2024, 9:48 AM   "

## 2024-05-27 NOTE — Plan of Care (Signed)

## 2024-05-27 NOTE — Progress Notes (Signed)
 This RN sedated pt for IR procedure. RN recovered pt in his inpatient room. He was complaining of abdominal pain around the site of the new biliary drain throughout recovery. As sedation started to wear off, Norco was administered to help patient with pain for a longer period of time. When 30 min recovery ended at approx 1045, pt started having shivering and intense abdominal pain at the site of the new drain. No fever and vitals remained stable. This RN messaged Dr. Karalee and McKenzie PA with inpatient nurse included in message. Pt's condition improved after approx 20 mins. Shivering stopped and pt appeared much more comfortable. Vitals remained stable.This RN handed off to inpatient RN. McKenzie PA went to bedside to assess pt after IR RN finished recovery.

## 2024-05-28 ENCOUNTER — Inpatient Hospital Stay: Payer: Self-pay

## 2024-05-28 DIAGNOSIS — K851 Biliary acute pancreatitis without necrosis or infection: Secondary | ICD-10-CM

## 2024-05-28 LAB — COMPREHENSIVE METABOLIC PANEL WITH GFR
ALT: 56 U/L — ABNORMAL HIGH (ref 0–44)
ALT: 50 U/L — ABNORMAL HIGH (ref 0–44)
AST: 25 U/L (ref 15–41)
AST: 26 U/L (ref 15–41)
Albumin: 3.4 g/dL — ABNORMAL LOW (ref 3.5–5.0)
Albumin: 3.4 g/dL — ABNORMAL LOW (ref 3.5–5.0)
Alkaline Phosphatase: 120 U/L (ref 38–126)
Alkaline Phosphatase: 152 U/L — ABNORMAL HIGH (ref 38–126)
Anion gap: 11 (ref 5–15)
Anion gap: 13 (ref 5–15)
BUN: 12 mg/dL (ref 8–23)
BUN: 16 mg/dL (ref 8–23)
CO2: 23 mmol/L (ref 22–32)
CO2: 21 mmol/L — ABNORMAL LOW (ref 22–32)
Calcium: 8.7 mg/dL — ABNORMAL LOW (ref 8.9–10.3)
Calcium: 8.6 mg/dL — ABNORMAL LOW (ref 8.9–10.3)
Chloride: 102 mmol/L (ref 98–111)
Chloride: 101 mmol/L (ref 98–111)
Creatinine, Ser: 0.9 mg/dL (ref 0.61–1.24)
Creatinine, Ser: 1.14 mg/dL (ref 0.61–1.24)
GFR, Estimated: >60 mL/min
GFR, Estimated: >60 mL/min
Glucose, Bld: 122 mg/dL — ABNORMAL HIGH (ref 70–99)
Glucose, Bld: 114 mg/dL — ABNORMAL HIGH (ref 70–99)
Potassium: 3.8 mmol/L (ref 3.5–5.1)
Potassium: 3.5 mmol/L (ref 3.5–5.1)
Sodium: 136 mmol/L (ref 135–145)
Sodium: 135 mmol/L (ref 135–145)
Total Bilirubin: 1 mg/dL (ref 0.0–1.2)
Total Bilirubin: 1.4 mg/dL — ABNORMAL HIGH (ref 0.0–1.2)
Total Protein: 7.2 g/dL (ref 6.5–8.1)
Total Protein: 7.1 g/dL (ref 6.5–8.1)

## 2024-05-28 LAB — CBC WITH DIFFERENTIAL/PLATELET
Abs Immature Granulocytes: 0.01 K/uL (ref 0.00–0.07)
Basophils Absolute: 0 K/uL (ref 0.0–0.1)
Basophils Relative: 0 %
Eosinophils Absolute: 0.1 K/uL (ref 0.0–0.5)
Eosinophils Relative: 5 %
HCT: 36.3 % — ABNORMAL LOW (ref 39.0–52.0)
Hemoglobin: 12.2 g/dL — ABNORMAL LOW (ref 13.0–17.0)
Immature Granulocytes: 0 %
Lymphocytes Relative: 16 %
Lymphs Abs: 0.4 K/uL — ABNORMAL LOW (ref 0.7–4.0)
MCH: 29.2 pg (ref 26.0–34.0)
MCHC: 33.6 g/dL (ref 30.0–36.0)
MCV: 86.8 fL (ref 80.0–100.0)
Monocytes Absolute: 0 K/uL — ABNORMAL LOW (ref 0.1–1.0)
Monocytes Relative: 0 %
Neutro Abs: 1.8 K/uL (ref 1.7–7.7)
Neutrophils Relative %: 79 %
Platelets: 140 K/uL — ABNORMAL LOW (ref 150–400)
RBC: 4.18 MIL/uL — ABNORMAL LOW (ref 4.22–5.81)
RDW: 13.1 % (ref 11.5–15.5)
Smear Review: NORMAL
WBC: 2.3 K/uL — ABNORMAL LOW (ref 4.0–10.5)
nRBC: 0 % (ref 0.0–0.2)

## 2024-05-28 LAB — CBC
HCT: 36.4 % — ABNORMAL LOW (ref 39.0–52.0)
Hemoglobin: 12.4 g/dL — ABNORMAL LOW (ref 13.0–17.0)
MCH: 29.5 pg (ref 26.0–34.0)
MCHC: 34.1 g/dL (ref 30.0–36.0)
MCV: 86.7 fL (ref 80.0–100.0)
Platelets: 181 K/uL (ref 150–400)
RBC: 4.2 MIL/uL — ABNORMAL LOW (ref 4.22–5.81)
RDW: 13.1 % (ref 11.5–15.5)
WBC: 9 K/uL (ref 4.0–10.5)
nRBC: 0 % (ref 0.0–0.2)

## 2024-05-28 LAB — LACTIC ACID, PLASMA: Lactic Acid, Venous: 2.6 mmol/L (ref 0.5–1.9)

## 2024-05-28 LAB — GLUCOSE, CAPILLARY
Glucose-Capillary: 118 mg/dL — ABNORMAL HIGH (ref 70–99)
Glucose-Capillary: 110 mg/dL — ABNORMAL HIGH (ref 70–99)
Glucose-Capillary: 129 mg/dL — ABNORMAL HIGH (ref 70–99)
Glucose-Capillary: 137 mg/dL — ABNORMAL HIGH (ref 70–99)

## 2024-05-28 LAB — PROTIME-INR
INR: 1.2 (ref 0.8–1.2)
Prothrombin Time: 15.5 s — ABNORMAL HIGH (ref 11.4–15.2)

## 2024-05-28 LAB — TROPONIN T, HIGH SENSITIVITY
Troponin T High Sensitivity: 19 ng/L (ref 0–19)
Troponin T High Sensitivity: 37 ng/L — ABNORMAL HIGH (ref 0–19)

## 2024-05-28 LAB — APTT: aPTT: 34 s (ref 24–36)

## 2024-05-28 LAB — MAGNESIUM: Magnesium: 1.5 mg/dL — ABNORMAL LOW (ref 1.7–2.4)

## 2024-05-28 MED ORDER — METRONIDAZOLE 500 MG/100ML IV SOLN
500.0000 mg | Freq: Two times a day (BID) | INTRAVENOUS | Status: DC
  Administered 2024-05-29 – 2024-05-31 (×6): 500 mg via INTRAVENOUS
  Filled 2024-05-28 (×9): qty 100

## 2024-05-28 MED ORDER — SODIUM CHLORIDE 0.9 % IV SOLN
2.0000 g | Freq: Once | INTRAVENOUS | Status: AC
  Administered 2024-05-29: 2 g via INTRAVENOUS
  Filled 2024-05-28 (×3): qty 12.5

## 2024-05-28 MED ORDER — ACETAMINOPHEN 10 MG/ML IV SOLN
1000.0000 mg | Freq: Once | INTRAVENOUS | Status: AC
  Administered 2024-05-28: 1000 mg via INTRAVENOUS
  Filled 2024-05-28: qty 100

## 2024-05-28 MED ORDER — VANCOMYCIN HCL IN DEXTROSE 1-5 GM/200ML-% IV SOLN
1000.0000 mg | Freq: Once | INTRAVENOUS | Status: DC
  Filled 2024-05-28: qty 200

## 2024-05-28 MED ORDER — SODIUM CHLORIDE 0.9 % IV BOLUS
250.0000 mL | Freq: Once | INTRAVENOUS | Status: AC
  Administered 2024-05-28: 250 mL via INTRAVENOUS

## 2024-05-28 MED ORDER — VANCOMYCIN HCL 2000 MG/400ML IV SOLN
2000.0000 mg | Freq: Once | INTRAVENOUS | Status: AC
  Administered 2024-05-29: 2000 mg via INTRAVENOUS
  Filled 2024-05-28 (×3): qty 400

## 2024-05-28 MED ORDER — DIGOXIN 0.25 MG/ML IJ SOLN
0.2500 mg | Freq: Once | INTRAMUSCULAR | Status: AC
  Administered 2024-05-28: 0.25 mg via INTRAVENOUS
  Filled 2024-05-28: qty 2

## 2024-05-28 MED ORDER — SODIUM CHLORIDE 0.9 % IV SOLN: INTRAVENOUS | Status: AC

## 2024-05-28 MED ORDER — VANCOMYCIN HCL 1750 MG/350ML IV SOLN: 1750.0000 mg | INTRAVENOUS | Status: DC

## 2024-05-28 MED ORDER — SODIUM CHLORIDE 0.9 % IV BOLUS
1000.0000 mL | Freq: Once | INTRAVENOUS | Status: AC
  Administered 2024-05-28: 1000 mL via INTRAVENOUS

## 2024-05-28 MED ORDER — METOPROLOL TARTRATE 5 MG/5ML IV SOLN
2.5000 mg | Freq: Once | INTRAVENOUS | Status: AC
  Administered 2024-05-28: 2.5 mg via INTRAVENOUS
  Filled 2024-05-28: qty 5

## 2024-05-28 MED ORDER — SODIUM CHLORIDE 0.9 % IV SOLN
2.0000 g | Freq: Three times a day (TID) | INTRAVENOUS | Status: DC
  Administered 2024-05-29 – 2024-06-03 (×15): 2 g via INTRAVENOUS
  Filled 2024-05-28 (×19): qty 12.5

## 2024-05-28 MED ORDER — SODIUM CHLORIDE 0.9 % IV SOLN
150.0000 mL/h | INTRAVENOUS | Status: AC
  Administered 2024-05-28 – 2024-05-29 (×2): 150 mL/h via INTRAVENOUS

## 2024-05-28 MED ORDER — DILTIAZEM HCL 25 MG/5ML IV SOLN
5.0000 mg | Freq: Once | INTRAVENOUS | Status: AC
  Administered 2024-05-28: 5 mg via INTRAVENOUS
  Filled 2024-05-28 (×2): qty 5

## 2024-05-28 NOTE — Plan of Care (Signed)
" °  Problem: Fluid Volume: Goal: Ability to maintain a balanced intake and output will improve Outcome: Progressing   Problem: Nutritional: Goal: Maintenance of adequate nutrition will improve Outcome: Progressing   Problem: Skin Integrity: Goal: Risk for impaired skin integrity will decrease Outcome: Progressing   Problem: Activity: Goal: Risk for activity intolerance will decrease Outcome: Progressing   Problem: Elimination: Goal: Will not experience complications related to bowel motility Outcome: Progressing   "

## 2024-05-28 NOTE — Progress Notes (Signed)
 " PROGRESS NOTE    Jeffery Sweeney  FMW:[id number] DOB: Aug 01, 1960 DOA: 05/24/2024 PCP: Towana Small, FNP  Assessment & Plan:   Principal Problem:   Acute gallstone pancreatitis Active Problems:   History of acute cholecystitis s/p percutaneous insertion of cholecystostomy tube 04/29/2024   CAD with history of CABG/Stent   Common bile duct stone   Diabetes mellitus without complication (HCC)   Essential hypertension   History of CVA (cerebrovascular accident)  Assessment and Plan: Acute gallstone pancreatitis: s/p percutaneous cholecystostomy 04/29/2024 for acute cholecystitis. S/p ERCP in which bile duct could not be cannulated but a small stone did come out with manipulation of the ampulla as per GI on 05/25/24. Cholecystectomy is scheduled in Feb 2026 sometime and gen surg (Dr. Desiderio) will keep previously scheduled surg date. S/p percutaneous transhepatic cholangiogram w/ biliary drain placement. Drain is still draining out blood today   Choledocholithiasis: as per MRCP. S/p ERCP in which the bile duct could not be cannulated but a small stone did come out with manipulation of the ampulla as per GI on 05/25/24. S/p percutaneous transhepatic cholangiogram w/ biliary drain placement as per IR. Drain is still draining out blood today. Will monitor H&H    Hx of CVA: continue on aspirin , statin    HTN: continue on home dose of metoprolol     DM2: well controlled, HbA1c 6.6. Continue on SSI w/ accuchecks   Bed bugs: s/p premethrin. Continue w/ contact precautions   Hx of CAD: w/ hx of CABG and stent. Continue on imdur , metoprolol , statin, aspirin .Hold anti-HTN meds for MAP < 65  Obesity: BMI 31.6. Would benefit from weight loss    DVT prophylaxis: SCDs Code Status: full  Family Communication:  Disposition Plan: likely d/c back home   Level of care: Med-Surg Consultants:  GI   Procedures:  Antimicrobials:   Subjective: Pt c/o abd pain   Objective: Vitals:    05/27/24 1705 05/27/24 2057 05/28/24 0500 05/28/24 0753  BP: 121/78 136/74 138/70 131/82  Pulse: 80 73 71 97  Resp:  18 18 17   Temp: 98.5 F (36.9 C) 98.2 F (36.8 C) 98.1 F (36.7 C) 98.5 F (36.9 C)  TempSrc:   Oral   SpO2: 95% 94% 98% 93%  Weight:      Height:        Intake/Output Summary (Last 24 hours) at 05/28/2024 1030 Last data filed at 05/28/2024 0900 Gross per 24 hour  Intake 240 ml  Output 330 ml  Net -90 ml   Filed Weights   05/24/24 1833  Weight: 81 kg    Examination:  General exam: appears comfortable  Respiratory system: clear breath sounds b/l Cardiovascular system: S1/S2+. No rubs or clicks  Gastrointestinal system: abd is soft, NT, obese, hypoactive bowel sounds. 2 drains in place Central nervous system: alert & awake. Moves all extremities  Psychiatry: Judgement and insight appears at baseline. Flat mood and affect    Data Reviewed: I have personally reviewed following labs and imaging studies  CBC: Recent Labs  Lab 05/24/24 1841 05/25/24 0426 05/26/24 0508 05/27/24 1325  WBC 9.9 7.3 6.0 4.0  HGB 13.7 12.8* 12.2* 13.1  HCT 41.6 38.6* 36.3* 38.0*  MCV 88.1 89.1 87.3 86.2  PLT 257 177 184 180   Basic Metabolic Panel: Recent Labs  Lab 05/24/24 1841 05/25/24 0426 05/26/24 0508 05/27/24 1325  NA 135 139 136 137  K 3.7 4.0 3.7 3.6  CL 101 104 103 104  CO2 22 24 22  22  GLUCOSE 113* 99 82 89  BUN 6* <5* 8 7*  CREATININE 0.86 0.80 0.67 0.73  CALCIUM  8.7* 8.2* 8.6* 9.0   GFR: Estimated Creatinine Clearance: 87.7 mL/min (by C-G formula based on SCr of 0.73 mg/dL). Liver Function Tests: Recent Labs  Lab 05/24/24 1841 05/25/24 0426 05/26/24 0508 05/27/24 1325  AST 504* 304* 85* 67*  ALT 201* 219* 122* 88*  ALKPHOS 130* 129* 120 149*  BILITOT 1.7* 1.6* 1.6* 1.4*  PROT 7.1 6.4* 6.4* 7.1  ALBUMIN  3.7 3.3* 3.2* 3.5   Recent Labs  Lab 05/24/24 1841 05/25/24 0426  LIPASE 1,518* 161*   No results for input(s): AMMONIA in the  last 168 hours. Coagulation Profile: Recent Labs  Lab 05/26/24 1557  INR 1.1   Cardiac Enzymes: No results for input(s): CKTOTAL, CKMB, CKMBINDEX, TROPONINI in the last 168 hours. BNP (last 3 results) No results for input(s): PROBNP in the last 8760 hours. HbA1C: No results for input(s): HGBA1C in the last 72 hours. CBG: Recent Labs  Lab 05/27/24 0826 05/27/24 1150 05/27/24 1602 05/27/24 2055 05/28/24 0749  GLUCAP 92 129* 126* 110* 118*   Lipid Profile: No results for input(s): CHOL, HDL, LDLCALC, TRIG, CHOLHDL, LDLDIRECT in the last 72 hours. Thyroid  Function Tests: No results for input(s): TSH, T4TOTAL, FREET4, T3FREE, THYROIDAB in the last 72 hours. Anemia Panel: No results for input(s): VITAMINB12, FOLATE, FERRITIN, TIBC, IRON, RETICCTPCT in the last 72 hours. Sepsis Labs: No results for input(s): PROCALCITON, LATICACIDVEN in the last 168 hours.  Recent Results (from the past 240 hours)  Culture, blood (Routine X 2) w Reflex to ID Panel     Status: None (Preliminary result)   Collection Time: 05/27/24  1:24 PM   Specimen: BLOOD  Result Value Ref Range Status   Specimen Description BLOOD BLOOD RIGHT ARM  Final   Special Requests   Final    BOTTLES DRAWN AEROBIC AND ANAEROBIC Blood Culture adequate volume   Culture   Final    NO GROWTH < 12 HOURS Performed at [facility], [address], [place], [place] [address]    Report Status PENDING  Incomplete  Culture, blood (Routine X 2) w Reflex to ID Panel     Status: None (Preliminary result)   Collection Time: 05/27/24  1:24 PM   Specimen: BLOOD  Result Value Ref Range Status   Specimen Description BLOOD BLOOD RIGHT HAND  Final   Special Requests   Final    BOTTLES DRAWN AEROBIC AND ANAEROBIC Blood Culture adequate volume   Culture   Final    NO GROWTH < 12 HOURS Performed at [facility], [address], [place], [place] [address]    Report  Status PENDING  Incomplete         Radiology Studies: IR EXCHANGE BILIARY DRAIN Result Date: 05/27/2024 INDICATION: 64 year old male with acute calculus cholecystitis status post percutaneous cholecystostomy tube placement on 04/29/2024 now with choledocholithiasis and status post unsuccessful ERCP. EXAM: 1. Attempted cholangiogram through existing percutaneous cholecystostomy tube 2. Exchange of percutaneous cholecystostomy tube 3. Transhepatic cholangiogram 4. Transhepatic percutaneous biliary drain placement MEDICATIONS: In patient already receiving intravenous antibiotics. ANESTHESIA/SEDATION: Moderate (conscious) sedation was employed during this procedure. A total of Versed  5 mg and Fentanyl  200 mcg was administered intravenously by the radiology nurse. Total intra-service moderate Sedation Time: 54 minutes. The patient's level of consciousness and vital signs were monitored continuously by radiology nursing throughout the procedure under my direct supervision. FLUOROSCOPY: Radiation Exposure Index (as provided by  the fluoroscopic device): 1,078 mGy Kerma COMPLICATIONS: None immediate. PROCEDURE: Informed written consent was obtained from the patient after a thorough discussion of the procedural risks, benefits and alternatives. All questions were addressed. Maximal Sterile Barrier Technique was utilized including caps, mask, sterile gowns, sterile gloves, sterile drape, hand hygiene and skin antiseptic. A timeout was performed prior to the initiation of the procedure. An initial contrast injection was performed through the patient's existing cholecystostomy tube. The gallbladder is partially decompressed. The gallbladder was filled with contrast. However, before the gallbladder even became distended, there was frank rupture of the superior aspect of the gallbladder neck likely due to pressurization from a completely impacted stone in the neck or proximal cystic duct. This resulted in extravasation of  contrast material into the region of the porta hepatis. The cystic duct was never visualized. The common bile duct was not visualized. At this point, the patient was experiencing significant discomfort. Therefore does the decision was made to exchange the existing cholecystostomy tube to place it in a better location closer to the neck of the gallbladder to facilitate appropriate drainage. The existing tube was transected and carefully removed over a wire. The wire was manipulated into the gallbladder neck. A new 10 French all-purpose drainage catheter was advanced over the wire and formed. Aspiration yields return of bloody bile. The patient experience relief once the gallbladder was decompressed. This catheter was then secured with 0 Prolene suture. Next attention was made to performing a standard percutaneous transhepatic cholangiogram. Local anesthesia was attained by infiltration with 1% lidocaine  at a spot along the mid axillary line. A 22 gauge Chiba needle was then advanced into the liver and slowly pulled back wall gently injecting contrast material. This maneuver was performed multiple times in till there was successful opacification of the biliary system. Transhepatic cholangiography demonstrates completely decompressed peripheral and intrahepatic biliary radicles but mild dilatation of the common bile duct. Filling defects are present in the distal common bile duct consistent with known choledocholithiasis. A second 22 gauge Chiba needle was then used to puncture a peripheral radicle in the right posterior ductal system. A 0.018 Nitrex wire was successfully advanced centrally into the common bile duct. The needle was removed and the transitional dilator was advanced over the wire and into the common bile duct. Next, a hydrophilic road runner wire was used to successfully cross the ampulla of Vater. The wire was then advanced into the horizontal duodenum. The transitional dilator was removed. The  percutaneous tract was dilated to 8 French. An 8 French internal/external biliary drainage catheter was advanced over the wire and formed. The distal loop is within the duodenum. Contrast injection opacifies of the biliary tree and the duodenum consistent with appropriate internal/external placement. This catheter was connected to bag drainage and secured to the skin with 0 Prolene suture. IMPRESSION: 1. Unsuccessful attempted cholangiogram through existing percutaneous cholecystostomy tube due to extremely friable gallbladder. With minimal pressure, there was frank extravasation of contrast material from the gallbladder neck due to complete occlusion of the gallbladder neck/proximal cystic duct. 2. Successful exchange for a new 10 French percutaneous cholecystostomy tube. 3. Successful transhepatic percutaneous transhepatic cholangiogram with placement of an 8 French internal/external biliary drainage catheter. PLAN: At some point in the coming weeks, Dr. Jinny plans to do a repeat ERCP in the hopes of successfully passing a wire next to the biliary drainage catheter to allow for sphincterotomy and balloon sweeping of the common duct. Once this has been successfully performed, the percutaneous biliary  catheter can likely be removed. The percutaneous cholecystostomy tube will need to stay in place until the time of interval cholecystectomy. Electronically Signed   By: Wilkie Lent M.D.   On: 05/27/2024 11:38   IR INT EXT BILIARY DRAIN WITH CHOLANGIOGRAM Result Date: 05/27/2024 INDICATION: 64 year old male with acute calculus cholecystitis status post percutaneous cholecystostomy tube placement on 04/29/2024 now with choledocholithiasis and status post unsuccessful ERCP. EXAM: 1. Attempted cholangiogram through existing percutaneous cholecystostomy tube 2. Exchange of percutaneous cholecystostomy tube 3. Transhepatic cholangiogram 4. Transhepatic percutaneous biliary drain placement MEDICATIONS: In patient  already receiving intravenous antibiotics. ANESTHESIA/SEDATION: Moderate (conscious) sedation was employed during this procedure. A total of Versed  5 mg and Fentanyl  200 mcg was administered intravenously by the radiology nurse. Total intra-service moderate Sedation Time: 54 minutes. The patient's level of consciousness and vital signs were monitored continuously by radiology nursing throughout the procedure under my direct supervision. FLUOROSCOPY: Radiation Exposure Index (as provided by the fluoroscopic device): 1,078 mGy Kerma COMPLICATIONS: None immediate. PROCEDURE: Informed written consent was obtained from the patient after a thorough discussion of the procedural risks, benefits and alternatives. All questions were addressed. Maximal Sterile Barrier Technique was utilized including caps, mask, sterile gowns, sterile gloves, sterile drape, hand hygiene and skin antiseptic. A timeout was performed prior to the initiation of the procedure. An initial contrast injection was performed through the patient's existing cholecystostomy tube. The gallbladder is partially decompressed. The gallbladder was filled with contrast. However, before the gallbladder even became distended, there was frank rupture of the superior aspect of the gallbladder neck likely due to pressurization from a completely impacted stone in the neck or proximal cystic duct. This resulted in extravasation of contrast material into the region of the porta hepatis. The cystic duct was never visualized. The common bile duct was not visualized. At this point, the patient was experiencing significant discomfort. Therefore does the decision was made to exchange the existing cholecystostomy tube to place it in a better location closer to the neck of the gallbladder to facilitate appropriate drainage. The existing tube was transected and carefully removed over a wire. The wire was manipulated into the gallbladder neck. A new 10 French all-purpose drainage  catheter was advanced over the wire and formed. Aspiration yields return of bloody bile. The patient experience relief once the gallbladder was decompressed. This catheter was then secured with 0 Prolene suture. Next attention was made to performing a standard percutaneous transhepatic cholangiogram. Local anesthesia was attained by infiltration with 1% lidocaine  at a spot along the mid axillary line. A 22 gauge Chiba needle was then advanced into the liver and slowly pulled back wall gently injecting contrast material. This maneuver was performed multiple times in till there was successful opacification of the biliary system. Transhepatic cholangiography demonstrates completely decompressed peripheral and intrahepatic biliary radicles but mild dilatation of the common bile duct. Filling defects are present in the distal common bile duct consistent with known choledocholithiasis. A second 22 gauge Chiba needle was then used to puncture a peripheral radicle in the right posterior ductal system. A 0.018 Nitrex wire was successfully advanced centrally into the common bile duct. The needle was removed and the transitional dilator was advanced over the wire and into the common bile duct. Next, a hydrophilic road runner wire was used to successfully cross the ampulla of Vater. The wire was then advanced into the horizontal duodenum. The transitional dilator was removed. The percutaneous tract was dilated to 8 French. An 8 French internal/external biliary drainage  catheter was advanced over the wire and formed. The distal loop is within the duodenum. Contrast injection opacifies of the biliary tree and the duodenum consistent with appropriate internal/external placement. This catheter was connected to bag drainage and secured to the skin with 0 Prolene suture. IMPRESSION: 1. Unsuccessful attempted cholangiogram through existing percutaneous cholecystostomy tube due to extremely friable gallbladder. With minimal pressure,  there was frank extravasation of contrast material from the gallbladder neck due to complete occlusion of the gallbladder neck/proximal cystic duct. 2. Successful exchange for a new 10 French percutaneous cholecystostomy tube. 3. Successful transhepatic percutaneous transhepatic cholangiogram with placement of an 8 French internal/external biliary drainage catheter. PLAN: At some point in the coming weeks, Dr. Jinny plans to do a repeat ERCP in the hopes of successfully passing a wire next to the biliary drainage catheter to allow for sphincterotomy and balloon sweeping of the common duct. Once this has been successfully performed, the percutaneous biliary catheter can likely be removed. The percutaneous cholecystostomy tube will need to stay in place until the time of interval cholecystectomy. Electronically Signed   By: Wilkie Lent M.D.   On: 05/27/2024 11:38        Scheduled Meds:  aspirin   81 mg Oral Daily   feeding supplement  1 Container Oral TID BM   insulin  aspart  0-9 Units Subcutaneous Q4H   isosorbide  mononitrate  60 mg Oral BID   metoprolol  tartrate  50 mg Oral BID   pantoprazole   40 mg Oral Daily   rosuvastatin   40 mg Oral Daily   Continuous Infusions:     LOS: 2 days      Anthony CHRISTELLA Pouch, MD [facility] Pager [phone]  If 7PM-7AM, please contact night-coverage [email or web address] 05/28/2024, 10:30 AM   "

## 2024-05-28 NOTE — Progress Notes (Signed)
 RN went to assess patient. Patients HR sustaining in 140s, and oxygen satting at 87% on RA. RN placed on 2L [place] and satted at 92% notified MD. Rapid Response was called at 2007. Patient became a red mews temp 103, HR 141, RR 24, patient was lethargic but able to answer orientation questions. Patient was profusely sweating and ice packs were placed under patients pressure points. MD placed new orders. Status post 2 fluid bolus and IV tylenol  patients temp increased to 103.5 and HR still sustaining 135-140s and increased oxygen needs to 6L [place] satting at 94%. Patient level of care was upgraded and this RN called and given report to [employer]

## 2024-05-28 NOTE — Plan of Care (Signed)

## 2024-05-28 NOTE — Consult Note (Signed)
 Pharmacy Antibiotic Note  Jeffery Sweeney is a 64 y.o. male admitted on 05/24/2024 with sepsis and acute gallstone pancreatitis s/p percutaneous cholecystostomy 04/29/2024 for acute cholecystitis.  Pharmacy has been consulted for vancomycin  and cefepime  dosing.  Plan:  Cefepime  2G q8h Vancomycin  2,000 mg LD scheduled to be given at 2100 on 1/10 Vancomycin  1,750 mg IV Q 24 hrs ordered to start at 2200 on 1/11 . Goal AUC 400-550. Obtain levels at steady state or as clinically indicated.   Expected AUC: 501.9 SCr used: 0.9  Weight used for dosing: IBW Cmin: 10.6 Will monitor serum creatinine daily while on vanc. Follow renal function and cultures for adjustments      Height: 5' 3 (160 cm) Weight: 81 kg (178 lb 9.2 oz) IBW/kg (Calculated) : 56.9  Temp (24hrs), Avg:100.4 F (38 C), Min:98.1 F (36.7 C), Max:103.5 F (39.7 C)  Recent Labs  Lab 05/25/24 0426 05/26/24 0508 05/27/24 1325 05/28/24 1106 05/28/24 2027  WBC 7.3 6.0 4.0 9.0 2.3*  CREATININE 0.80 0.67 0.73 0.90 1.14    Estimated Creatinine Clearance: 61.6 mL/min (by C-G formula based on SCr of 1.14 mg/dL).    Allergies[1]  Antimicrobials this admission: 1/10 vancomycin  >>  1/10 cefepime  >>  1/10 metronidazole >>   Microbiology results: 1/9 BCx: NGTD   Thank you for allowing pharmacy to be a part of this patients care.  Jeffery Sweeney 05/28/2024 9:01 PM     [1] No Known Allergies

## 2024-05-29 LAB — COMPREHENSIVE METABOLIC PANEL WITH GFR
ALT: 44 U/L (ref 0–44)
AST: 37 U/L (ref 15–41)
Albumin: 3 g/dL — ABNORMAL LOW (ref 3.5–5.0)
Alkaline Phosphatase: 98 U/L (ref 38–126)
Anion gap: 11 (ref 5–15)
BUN: 16 mg/dL (ref 8–23)
CO2: 19 mmol/L — ABNORMAL LOW (ref 22–32)
Calcium: 8.3 mg/dL — ABNORMAL LOW (ref 8.9–10.3)
Chloride: 105 mmol/L (ref 98–111)
Creatinine, Ser: 0.92 mg/dL (ref 0.61–1.24)
GFR, Estimated: >60 mL/min
Glucose, Bld: 135 mg/dL — ABNORMAL HIGH (ref 70–99)
Potassium: 3.7 mmol/L (ref 3.5–5.1)
Sodium: 134 mmol/L — ABNORMAL LOW (ref 135–145)
Total Bilirubin: 1.2 mg/dL (ref 0.0–1.2)
Total Protein: 6.4 g/dL — ABNORMAL LOW (ref 6.5–8.1)

## 2024-05-29 LAB — CBC
HCT: 32.8 % — ABNORMAL LOW (ref 39.0–52.0)
Hemoglobin: 11 g/dL — ABNORMAL LOW (ref 13.0–17.0)
MCH: 29.4 pg (ref 26.0–34.0)
MCHC: 33.5 g/dL (ref 30.0–36.0)
MCV: 87.7 fL (ref 80.0–100.0)
Platelets: 135 K/uL — ABNORMAL LOW (ref 150–400)
RBC: 3.74 MIL/uL — ABNORMAL LOW (ref 4.22–5.81)
RDW: 13.1 % (ref 11.5–15.5)
WBC: 11.8 K/uL — ABNORMAL HIGH (ref 4.0–10.5)
nRBC: 0 % (ref 0.0–0.2)

## 2024-05-29 LAB — GLUCOSE, CAPILLARY
Glucose-Capillary: 102 mg/dL — ABNORMAL HIGH (ref 70–99)
Glucose-Capillary: 104 mg/dL — ABNORMAL HIGH (ref 70–99)
Glucose-Capillary: 142 mg/dL — ABNORMAL HIGH (ref 70–99)
Glucose-Capillary: 67 mg/dL — ABNORMAL LOW (ref 70–99)
Glucose-Capillary: 76 mg/dL (ref 70–99)
Glucose-Capillary: 78 mg/dL (ref 70–99)
Glucose-Capillary: 90 mg/dL (ref 70–99)
Glucose-Capillary: 98 mg/dL (ref 70–99)

## 2024-05-29 LAB — LACTIC ACID, PLASMA: Lactic Acid, Venous: 1.8 mmol/L (ref 0.5–1.9)

## 2024-05-29 MED ORDER — POLYETHYLENE GLYCOL 3350 17 G PO PACK
17.0000 g | PACK | Freq: Every day | ORAL | Status: DC
  Administered 2024-05-30 – 2024-06-02 (×3): 17 g via ORAL
  Filled 2024-05-29 (×6): qty 1

## 2024-05-29 MED ORDER — MAGNESIUM HYDROXIDE 400 MG/5ML PO SUSP: 30.0000 mL | Freq: Every day | ORAL | Status: DC | PRN

## 2024-05-29 NOTE — Progress Notes (Signed)
 " PROGRESS NOTE    Jeffery Sweeney  FMW:[id number] DOB: [birth year] DOA: 05/24/2024 PCP: Towana Small, FNP  Assessment & Plan:   Principal Problem:   Acute gallstone pancreatitis Active Problems:   History of acute cholecystitis s/p percutaneous insertion of cholecystostomy tube 04/29/2024   CAD with history of CABG/Stent   Common bile duct stone   Diabetes mellitus without complication (HCC)   Essential hypertension   History of CVA (cerebrovascular accident)  Assessment and Plan: Acute gallstone pancreatitis: s/p percutaneous cholecystostomy 04/29/2024 for acute cholecystitis. S/p ERCP in which bile duct could not be cannulated but a small stone did come out with manipulation of the ampulla as per GI on 05/25/24. Cholecystectomy is scheduled in Feb 2026 sometime and gen surg (Dr. Desiderio) will keep previously scheduled surg date. S/p percutaneous transhepatic cholangiogram w/ biliary drain placement. Drain is still draining out blood today   Choledocholithiasis: as per MRCP. S/p ERCP in which the bile duct could not be cannulated but a small stone did come out with manipulation of the ampulla as per GI on 05/25/24. S/p percutaneous transhepatic cholangiogram w/ biliary drain placement on 05/26/24 as per IR. Drain is still draining out blood today. Will monitor H&H   Bacteremia: blood cxs growing gram neg rods, sens pending. Continue on IV cefepime , flagyl     Hx of CVA:  continue on statin, aspirin     HTN: continue on home dose of metoprolol     DM2: well controlled, HbA1c 6.6. Continue on SSI w/ accuchecks    Bed bugs: s/p premethrin. Continue w/ contact precautions   Hx of CAD: w/ hx of CABG and stent. Holding imdur . Continue on metoprolol , statin, aspirin . Hold anti-HTN meds for MAP < 65  Obesity: BMI 31.6. Would benefit from weight loss    DVT prophylaxis: SCDs Code Status: full  Family Communication:  Disposition Plan: likely d/c back home   Level of care:  Progressive Consultants:  GI   Procedures:  Antimicrobials:   Subjective: Pt c/o abd pain   Objective: Vitals:   05/29/24 0300 05/29/24 0400 05/29/24 0500 05/29/24 0600  BP: 101/63 97/64 106/65 104/63  Pulse: 96 93 93 90  Resp:      Temp:   98.6 F (37 C)   TempSrc:      SpO2: 95% 95% 96% 97%  Weight:      Height:        Intake/Output Summary (Last 24 hours) at 05/29/2024 0925 Last data filed at 05/29/2024 0549 Gross per 24 hour  Intake --  Output 1205 ml  Net -1205 ml   Filed Weights   05/24/24 1833  Weight: 81 kg    Examination:  General exam: appears calm & comfortable  Respiratory system: clear breath sounds b/l Cardiovascular system: S1 & S2+. No rubs or clicks   Gastrointestinal system: abd is soft, tenderness to palpation, hypoactive bowel sounds. 2 drains in place Central nervous system: alert & awake. Moves all extremities Psychiatry: Judgement and insight appears at baseline. Flat mood and affect    Data Reviewed: I have personally reviewed following labs and imaging studies  CBC: Recent Labs  Lab 05/26/24 0508 05/27/24 1325 05/28/24 1106 05/28/24 2027 05/29/24 0448  WBC 6.0 4.0 9.0 2.3* 11.8*  NEUTROABS  --   --   --  1.8  --   HGB 12.2* 13.1 12.4* 12.2* 11.0*  HCT 36.3* 38.0* 36.4* 36.3* 32.8*  MCV 87.3 86.2 86.7 86.8 87.7  PLT 184 180 181 140* 135*  Basic Metabolic Panel: Recent Labs  Lab 05/26/24 0508 05/27/24 1325 05/28/24 1106 05/28/24 2027 05/29/24 0448  NA 136 137 136 135 134*  K 3.7 3.6 3.8 3.5 3.7  CL 103 104 102 101 105  CO2 22 22 23  21* 19*  GLUCOSE 82 89 122* 114* 135*  BUN 8 7* 12 16 16   CREATININE 0.67 0.73 0.90 1.14 0.92  CALCIUM  8.6* 9.0 8.7* 8.6* 8.3*  MG  --   --   --  1.5*  --    GFR: Estimated Creatinine Clearance: 76.3 mL/min (by C-G formula based on SCr of 0.92 mg/dL). Liver Function Tests: Recent Labs  Lab 05/26/24 0508 05/27/24 1325 05/28/24 1106 05/28/24 2027 05/29/24 0448  AST 85* 67* 25  26 37  ALT 122* 88* 56* 50* 44  ALKPHOS 120 149* 120 152* 98  BILITOT 1.6* 1.4* 1.0 1.4* 1.2  PROT 6.4* 7.1 7.2 7.1 6.4*  ALBUMIN  3.2* 3.5 3.4* 3.4* 3.0*   Recent Labs  Lab 05/24/24 1841 05/25/24 0426  LIPASE 1,518* 161*   No results for input(s): AMMONIA in the last 168 hours. Coagulation Profile: Recent Labs  Lab 05/26/24 1557 05/28/24 2027  INR 1.1 1.2   Cardiac Enzymes: No results for input(s): CKTOTAL, CKMB, CKMBINDEX, TROPONINI in the last 168 hours. BNP (last 3 results) No results for input(s): PROBNP in the last 8760 hours. HbA1C: No results for input(s): HGBA1C in the last 72 hours. CBG: Recent Labs  Lab 05/28/24 1934 05/29/24 0029 05/29/24 0132 05/29/24 0530 05/29/24 0754  GLUCAP 137* 67* 90 142* 102*   Lipid Profile: No results for input(s): CHOL, HDL, LDLCALC, TRIG, CHOLHDL, LDLDIRECT in the last 72 hours. Thyroid  Function Tests: No results for input(s): TSH, T4TOTAL, FREET4, T3FREE, THYROIDAB in the last 72 hours. Anemia Panel: No results for input(s): VITAMINB12, FOLATE, FERRITIN, TIBC, IRON, RETICCTPCT in the last 72 hours. Sepsis Labs: Recent Labs  Lab 05/28/24 2027 05/28/24 2222  LATICACIDVEN 2.6* 1.8    Recent Results (from the past 240 hours)  Culture, blood (Routine X 2) w Reflex to ID Panel     Status: None (Preliminary result)   Collection Time: 05/27/24  1:24 PM   Specimen: BLOOD  Result Value Ref Range Status   Specimen Description BLOOD BLOOD RIGHT ARM  Final   Special Requests   Final    BOTTLES DRAWN AEROBIC AND ANAEROBIC Blood Culture adequate volume   Culture   Final    NO GROWTH 2 DAYS Performed at [facility], [address], [place], [place] [address]    Report Status PENDING  Incomplete  Culture, blood (Routine X 2) w Reflex to ID Panel     Status: None (Preliminary result)   Collection Time: 05/27/24  1:24 PM   Specimen: BLOOD  Result Value Ref Range Status    Specimen Description BLOOD BLOOD RIGHT HAND  Final   Special Requests   Final    BOTTLES DRAWN AEROBIC AND ANAEROBIC Blood Culture adequate volume   Culture   Final    NO GROWTH 2 DAYS Performed at [facility], [address] [place], [place] [address]    Report Status PENDING  Incomplete  Culture, blood (Routine X 2) w Reflex to ID Panel     Status: None (Preliminary result)   Collection Time: 05/28/24  8:58 PM   Specimen: BLOOD  Result Value Ref Range Status   Specimen Description BLOOD BLOOD RIGHT ARM  Final   Special Requests   Final  BOTTLES DRAWN AEROBIC AND ANAEROBIC Blood Culture adequate volume   Culture  Setup Time   Final    GRAM NEGATIVE RODS IN BOTH AEROBIC AND ANAEROBIC BOTTLES CRITICAL RESULT CALLED TO, READ BACK BY AND VERIFIED WITH: CATHALEEN BLANCH 05/29/24 0841 MW Performed at [facility], [address], [place], [place] [address]    Culture GRAM NEGATIVE RODS  Final   Report Status PENDING  Incomplete  Culture, blood (Routine X 2) w Reflex to ID Panel     Status: None (Preliminary result)   Collection Time: 05/28/24  9:00 PM   Specimen: BLOOD  Result Value Ref Range Status   Specimen Description BLOOD BLOOD RIGHT HAND  Final   Special Requests   Final    BOTTLES DRAWN AEROBIC AND ANAEROBIC Blood Culture adequate volume   Culture  Setup Time   Final    Organism ID to follow GRAM NEGATIVE RODS IN BOTH AEROBIC AND ANAEROBIC BOTTLES CRITICAL RESULT CALLED TO, READ BACK BY AND VERIFIED WITH: CATHALEEN BLANCH 05/29/24 0841 MW Performed at [facility], [address] [place], [place] [address]    Culture GRAM NEGATIVE RODS  Final   Report Status PENDING  Incomplete         Radiology Studies: DG Chest Port 1 View Result Date: 05/28/2024 CLINICAL DATA:  Pneumonia EXAM: PORTABLE CHEST 1 VIEW COMPARISON:  11/30/2023 FINDINGS: Single frontal view of the chest demonstrates an unremarkable cardiac silhouette. Postsurgical changes  from CABG. No acute airspace disease, effusion, or pneumothorax. Lung volumes are diminished. No acute bony abnormalities. Biliary drain overlies right upper quadrant. IMPRESSION: 1. Low lung volumes.  No acute intrathoracic process. Electronically Signed   By: Ozell Daring M.D.   On: 05/28/2024 20:59   IR EXCHANGE BILIARY DRAIN Result Date: 05/27/2024 INDICATION: 64 year old male with acute calculus cholecystitis status post percutaneous cholecystostomy tube placement on 04/29/2024 now with choledocholithiasis and status post unsuccessful ERCP. EXAM: 1. Attempted cholangiogram through existing percutaneous cholecystostomy tube 2. Exchange of percutaneous cholecystostomy tube 3. Transhepatic cholangiogram 4. Transhepatic percutaneous biliary drain placement MEDICATIONS: In patient already receiving intravenous antibiotics. ANESTHESIA/SEDATION: Moderate (conscious) sedation was employed during this procedure. A total of Versed  5 mg and Fentanyl  200 mcg was administered intravenously by the radiology nurse. Total intra-service moderate Sedation Time: 54 minutes. The patient's level of consciousness and vital signs were monitored continuously by radiology nursing throughout the procedure under my direct supervision. FLUOROSCOPY: Radiation Exposure Index (as provided by the fluoroscopic device): 1,078 mGy Kerma COMPLICATIONS: None immediate. PROCEDURE: Informed written consent was obtained from the patient after a thorough discussion of the procedural risks, benefits and alternatives. All questions were addressed. Maximal Sterile Barrier Technique was utilized including caps, mask, sterile gowns, sterile gloves, sterile drape, hand hygiene and skin antiseptic. A timeout was performed prior to the initiation of the procedure. An initial contrast injection was performed through the patient's existing cholecystostomy tube. The gallbladder is partially decompressed. The gallbladder was filled with contrast. However,  before the gallbladder even became distended, there was frank rupture of the superior aspect of the gallbladder neck likely due to pressurization from a completely impacted stone in the neck or proximal cystic duct. This resulted in extravasation of contrast material into the region of the porta hepatis. The cystic duct was never visualized. The common bile duct was not visualized. At this point, the patient was experiencing significant discomfort. Therefore does the decision was made to exchange the existing cholecystostomy tube to place it in a better  location closer to the neck of the gallbladder to facilitate appropriate drainage. The existing tube was transected and carefully removed over a wire. The wire was manipulated into the gallbladder neck. A new 10 French all-purpose drainage catheter was advanced over the wire and formed. Aspiration yields return of bloody bile. The patient experience relief once the gallbladder was decompressed. This catheter was then secured with 0 Prolene suture. Next attention was made to performing a standard percutaneous transhepatic cholangiogram. Local anesthesia was attained by infiltration with 1% lidocaine  at a spot along the mid axillary line. A 22 gauge Chiba needle was then advanced into the liver and slowly pulled back wall gently injecting contrast material. This maneuver was performed multiple times in till there was successful opacification of the biliary system. Transhepatic cholangiography demonstrates completely decompressed peripheral and intrahepatic biliary radicles but mild dilatation of the common bile duct. Filling defects are present in the distal common bile duct consistent with known choledocholithiasis. A second 22 gauge Chiba needle was then used to puncture a peripheral radicle in the right posterior ductal system. A 0.018 Nitrex wire was successfully advanced centrally into the common bile duct. The needle was removed and the transitional dilator was  advanced over the wire and into the common bile duct. Next, a hydrophilic road runner wire was used to successfully cross the ampulla of Vater. The wire was then advanced into the horizontal duodenum. The transitional dilator was removed. The percutaneous tract was dilated to 8 French. An 8 French internal/external biliary drainage catheter was advanced over the wire and formed. The distal loop is within the duodenum. Contrast injection opacifies of the biliary tree and the duodenum consistent with appropriate internal/external placement. This catheter was connected to bag drainage and secured to the skin with 0 Prolene suture. IMPRESSION: 1. Unsuccessful attempted cholangiogram through existing percutaneous cholecystostomy tube due to extremely friable gallbladder. With minimal pressure, there was frank extravasation of contrast material from the gallbladder neck due to complete occlusion of the gallbladder neck/proximal cystic duct. 2. Successful exchange for a new 10 French percutaneous cholecystostomy tube. 3. Successful transhepatic percutaneous transhepatic cholangiogram with placement of an 8 French internal/external biliary drainage catheter. PLAN: At some point in the coming weeks, Dr. Jinny plans to do a repeat ERCP in the hopes of successfully passing a wire next to the biliary drainage catheter to allow for sphincterotomy and balloon sweeping of the common duct. Once this has been successfully performed, the percutaneous biliary catheter can likely be removed. The percutaneous cholecystostomy tube will need to stay in place until the time of interval cholecystectomy. Electronically Signed   By: Wilkie Lent M.D.   On: 05/27/2024 11:38   IR INT EXT BILIARY DRAIN WITH CHOLANGIOGRAM Result Date: 05/27/2024 INDICATION: 64 year old male with acute calculus cholecystitis status post percutaneous cholecystostomy tube placement on 04/29/2024 now with choledocholithiasis and status post unsuccessful ERCP.  EXAM: 1. Attempted cholangiogram through existing percutaneous cholecystostomy tube 2. Exchange of percutaneous cholecystostomy tube 3. Transhepatic cholangiogram 4. Transhepatic percutaneous biliary drain placement MEDICATIONS: In patient already receiving intravenous antibiotics. ANESTHESIA/SEDATION: Moderate (conscious) sedation was employed during this procedure. A total of Versed  5 mg and Fentanyl  200 mcg was administered intravenously by the radiology nurse. Total intra-service moderate Sedation Time: 54 minutes. The patient's level of consciousness and vital signs were monitored continuously by radiology nursing throughout the procedure under my direct supervision. FLUOROSCOPY: Radiation Exposure Index (as provided by the fluoroscopic device): 1,078 mGy Kerma COMPLICATIONS: None immediate. PROCEDURE: Informed written consent was  obtained from the patient after a thorough discussion of the procedural risks, benefits and alternatives. All questions were addressed. Maximal Sterile Barrier Technique was utilized including caps, mask, sterile gowns, sterile gloves, sterile drape, hand hygiene and skin antiseptic. A timeout was performed prior to the initiation of the procedure. An initial contrast injection was performed through the patient's existing cholecystostomy tube. The gallbladder is partially decompressed. The gallbladder was filled with contrast. However, before the gallbladder even became distended, there was frank rupture of the superior aspect of the gallbladder neck likely due to pressurization from a completely impacted stone in the neck or proximal cystic duct. This resulted in extravasation of contrast material into the region of the porta hepatis. The cystic duct was never visualized. The common bile duct was not visualized. At this point, the patient was experiencing significant discomfort. Therefore does the decision was made to exchange the existing cholecystostomy tube to place it in a better  location closer to the neck of the gallbladder to facilitate appropriate drainage. The existing tube was transected and carefully removed over a wire. The wire was manipulated into the gallbladder neck. A new 10 French all-purpose drainage catheter was advanced over the wire and formed. Aspiration yields return of bloody bile. The patient experience relief once the gallbladder was decompressed. This catheter was then secured with 0 Prolene suture. Next attention was made to performing a standard percutaneous transhepatic cholangiogram. Local anesthesia was attained by infiltration with 1% lidocaine  at a spot along the mid axillary line. A 22 gauge Chiba needle was then advanced into the liver and slowly pulled back wall gently injecting contrast material. This maneuver was performed multiple times in till there was successful opacification of the biliary system. Transhepatic cholangiography demonstrates completely decompressed peripheral and intrahepatic biliary radicles but mild dilatation of the common bile duct. Filling defects are present in the distal common bile duct consistent with known choledocholithiasis. A second 22 gauge Chiba needle was then used to puncture a peripheral radicle in the right posterior ductal system. A 0.018 Nitrex wire was successfully advanced centrally into the common bile duct. The needle was removed and the transitional dilator was advanced over the wire and into the common bile duct. Next, a hydrophilic road runner wire was used to successfully cross the ampulla of Vater. The wire was then advanced into the horizontal duodenum. The transitional dilator was removed. The percutaneous tract was dilated to 8 French. An 8 French internal/external biliary drainage catheter was advanced over the wire and formed. The distal loop is within the duodenum. Contrast injection opacifies of the biliary tree and the duodenum consistent with appropriate internal/external placement. This catheter  was connected to bag drainage and secured to the skin with 0 Prolene suture. IMPRESSION: 1. Unsuccessful attempted cholangiogram through existing percutaneous cholecystostomy tube due to extremely friable gallbladder. With minimal pressure, there was frank extravasation of contrast material from the gallbladder neck due to complete occlusion of the gallbladder neck/proximal cystic duct. 2. Successful exchange for a new 10 French percutaneous cholecystostomy tube. 3. Successful transhepatic percutaneous transhepatic cholangiogram with placement of an 8 French internal/external biliary drainage catheter. PLAN: At some point in the coming weeks, Dr. Jinny plans to do a repeat ERCP in the hopes of successfully passing a wire next to the biliary drainage catheter to allow for sphincterotomy and balloon sweeping of the common duct. Once this has been successfully performed, the percutaneous biliary catheter can likely be removed. The percutaneous cholecystostomy tube will need to stay in  place until the time of interval cholecystectomy. Electronically Signed   By: Wilkie Lent M.D.   On: 05/27/2024 11:38        Scheduled Meds:  aspirin   81 mg Oral Daily   feeding supplement  1 Container Oral TID BM   insulin  aspart  0-9 Units Subcutaneous Q4H   isosorbide  mononitrate  60 mg Oral BID   metoprolol  tartrate  50 mg Oral BID   pantoprazole   40 mg Oral Daily   rosuvastatin   40 mg Oral Daily   Continuous Infusions:  sodium chloride      sodium chloride  150 mL/hr (05/29/24 0901)   ceFEPime  (MAXIPIME ) IV 2 g (05/29/24 0907)   metronidazole  500 mg (05/29/24 0059)   vancomycin  HCl        LOS: 3 days      Anthony CHRISTELLA Pouch, MD [facility] Pager [phone]  If 7PM-7AM, please contact night-coverage [email or web address] 05/29/2024, 9:25 AM   "

## 2024-05-29 NOTE — Evaluation (Signed)
 Occupational Therapy Evaluation Patient Details Name: Jeffery Sweeney MRN: [id number] DOB: 1961-03-30 Today's Date: 05/29/2024   History of Present Illness   presented to ER secondary to abdominal pain; admitted for management of acute gallstone pancreatitis, s/p ERCP 1/7.  Currently with cholecystostomy tube placed 12/12 during recent admission (12/10-16).  Cholecystectomy scheduled for February     Clinical Impressions Patient was seen for OT evaluation this date. Prior to hospital admission, patient was living alone and independent. OT intially screened and signed off however patient had change of status last night. Patient started eval on 5L of O2, gradually titrated down to 2L with patient satting above 95% at rest; decreased to 86% with activity but returned to >90% with rest. Patient performed bed mobility and transfer to recliner with min A /CGA. Patient limited by poor activity tolerance/deconditioning.  Patient would benefit from skilled OT services to address noted impairments and functional limitations (see below for any additional details) in order to maximize safety and independence while minimizing future risk of falls, injury, and readmission. Anticipate the need for follow up OT services upon acute hospital DC.      If plan is discharge home, recommend the following:   A little help with walking and/or transfers;A little help with bathing/dressing/bathroom     Functional Status Assessment   Patient has had a recent decline in their functional status and demonstrates the ability to make significant improvements in function in a reasonable and predictable amount of time.     Equipment Recommendations   None recommended by OT     Recommendations for Other Services         Precautions/Restrictions   Precautions Precautions: Fall Recall of Precautions/Restrictions: Impaired Restrictions Weight Bearing Restrictions Per Provider Order: No      Mobility Bed Mobility Overal bed mobility: Needs Assistance Bed Mobility: Supine to Sit     Supine to sit: Contact guard     General bed mobility comments: A to manage drains/IIVs/telemtry    Transfers Overall transfer level: Needs assistance Equipment used: None, 1 person hand held assist Transfers: Sit to/from Stand, Bed to chair/wheelchair/BSC Sit to Stand: Min assist     Step pivot transfers: Contact guard assist     General transfer comment: assist to steady upon standing due to posterior LOB. Completed transfer to recliner with HHA. SpO2 dropped to 86% after increase to 4L from 2L at beginning of session      Balance Overall balance assessment: Needs assistance Sitting-balance support: Feet supported Sitting balance-Leahy Scale: Good     Standing balance support: Single extremity supported, During functional activity Standing balance-Leahy Scale: Fair Standing balance comment: no UE support for donning pants over hips                           ADL either performed or assessed with clinical judgement   ADL Overall ADL's : Needs assistance/impaired                                     Functional mobility during ADLs: Contact guard assist;Minimal assistance General ADL Comments: anticipate patient will require min-mod A for ADLs due to weakness/deconditioning     Vision         Perception         Praxis         Pertinent Vitals/Pain Pain Assessment Pain Assessment: Faces  Faces Pain Scale: Hurts little more Pain Location: abdomen Pain Descriptors / Indicators: Discomfort, Sore Pain Intervention(s): Monitored during session, Limited activity within patient's tolerance     Extremity/Trunk Assessment Upper Extremity Assessment Upper Extremity Assessment: Generalized weakness   Lower Extremity Assessment Lower Extremity Assessment: Generalized weakness       Communication Communication Communication: No apparent  difficulties   Cognition Arousal: Alert Behavior During Therapy: WFL for tasks assessed/performed Cognition: No apparent impairments                               Following commands: Intact       Cueing  General Comments   Cueing Techniques: Verbal cues  Initially on 5L O2 on arrival, slowly titrated down to 2L at rest with spO2 97% throughout. With activity, SOB noted with inability to obtained good pleth so increased to 4L with spO2 reading as low as 88%   Exercises     Shoulder Instructions      Home Living Family/patient expects to be discharged to:: Private residence Living Arrangements: Alone Available Help at Discharge: Family;Available PRN/intermittently Type of Home: House Home Access: Stairs to enter [employer] of Steps: 9 steps Entrance Stairs-Rails: Left;Right Home Layout: Multi-level Alternate Level Stairs-Number of Steps: 9 steps to floor with bed/bathroom   Bathroom Shower/Tub: Producer, Television/film/video: Standard Bathroom Accessibility: Yes   Home Equipment: Grab bars - toilet          Prior Functioning/Environment Prior Level of Function : Independent/Modified Independent;Driving;History of Falls (last six months)             Mobility Comments: IND ambulator without AD; + driving ADLs Comments: IND with ADL's and IADL's    OT Problem List: Decreased strength;Decreased safety awareness;Decreased activity tolerance;Impaired balance (sitting and/or standing)   OT Treatment/Interventions: Self-care/ADL training;Therapeutic activities;Energy conservation;Patient/family education;Balance training      OT Goals(Current goals can be found in the care plan section)   Acute Rehab OT Goals Patient Stated Goal: to go home OT Goal Formulation: With patient Time For Goal Achievement: 06/12/24 Potential to Achieve Goals: Fair ADL Goals Pt Will Perform Grooming: with modified independence;standing Pt Will Perform  Lower Body Dressing: with modified independence;sit to/from stand Pt Will Transfer to Toilet: with modified independence;ambulating Pt Will Perform Toileting - Clothing Manipulation and hygiene: with modified independence;sit to/from stand   OT Frequency:  Min 2X/week    Co-evaluation PT/OT/SLP Co-Evaluation/Treatment: Yes Reason for Co-Treatment: Complexity of the patient's impairments (multi-system involvement);For patient/therapist safety;To address functional/ADL transfers PT goals addressed during session: Mobility/safety with mobility OT goals addressed during session: ADL's and self-care      AM-PAC OT 6 Clicks Daily Activity     Outcome Measure Help from another person eating meals?: None Help from another person taking care of personal grooming?: A Little Help from another person toileting, which includes using toliet, bedpan, or urinal?: A Little Help from another person bathing (including washing, rinsing, drying)?: A Little Help from another person to put on and taking off regular upper body clothing?: None Help from another person to put on and taking off regular lower body clothing?: A Little 6 Click Score: 20   End of Session Nurse Communication: Mobility status  Activity Tolerance: Patient tolerated treatment well Patient left: with call bell/phone within reach;Other (comment);in chair;with chair alarm set  OT Visit Diagnosis: Unsteadiness on feet (R26.81);Muscle weakness (generalized) (M62.81)  Time: 8866-8846 OT Time Calculation (min): 20 min Charges:  OT General Charges $OT Visit: 1 Visit OT Evaluation $OT Eval Low Complexity: 1 Low  Rogers Clause, OT/L [facility], 05/29/2024

## 2024-05-29 NOTE — Plan of Care (Signed)
  Problem: Education: Goal: Ability to describe self-care measures that may prevent or decrease complications (Diabetes Survival Skills Education) will improve Outcome: Progressing Goal: Individualized Educational Video(s) Outcome: Progressing   Problem: Coping: Goal: Ability to adjust to condition or change in health will improve Outcome: Progressing   Problem: Fluid Volume: Goal: Ability to maintain a balanced intake and output will improve Outcome: Progressing   Problem: Health Behavior/Discharge Planning: Goal: Ability to identify and utilize available resources and services will improve Outcome: Progressing Goal: Ability to manage health-related needs will improve Outcome: Progressing   Problem: Metabolic: Goal: Ability to maintain appropriate glucose levels will improve Outcome: Progressing   Problem: Nutritional: Goal: Maintenance of adequate nutrition will improve Outcome: Progressing Goal: Progress toward achieving an optimal weight will improve Outcome: Progressing   Problem: Skin Integrity: Goal: Risk for impaired skin integrity will decrease Outcome: Progressing   Problem: Tissue Perfusion: Goal: Adequacy of tissue perfusion will improve Outcome: Progressing   Problem: Education: Goal: Knowledge of General Education information will improve Description: Including pain rating scale, medication(s)/side effects and non-pharmacologic comfort measures Outcome: Progressing   Problem: Health Behavior/Discharge Planning: Goal: Ability to manage health-related needs will improve Outcome: Progressing   Problem: Clinical Measurements: Goal: Ability to maintain clinical measurements within normal limits will improve Outcome: Progressing Goal: Will remain free from infection Outcome: Progressing Goal: Diagnostic test results will improve Outcome: Progressing Goal: Respiratory complications will improve Outcome: Progressing Goal: Cardiovascular complication will  be avoided Outcome: Progressing   Problem: Activity: Goal: Risk for activity intolerance will decrease Outcome: Progressing   Problem: Nutrition: Goal: Adequate nutrition will be maintained Outcome: Progressing   Problem: Coping: Goal: Level of anxiety will decrease Outcome: Progressing   Problem: Elimination: Goal: Will not experience complications related to bowel motility Outcome: Progressing Goal: Will not experience complications related to urinary retention Outcome: Progressing   Problem: Pain Managment: Goal: General experience of comfort will improve and/or be controlled Outcome: Progressing   Problem: Safety: Goal: Ability to remain free from injury will improve Outcome: Progressing   Problem: Skin Integrity: Goal: Risk for impaired skin integrity will decrease Outcome: Progressing   Problem: Fluid Volume: Goal: Hemodynamic stability will improve Outcome: Progressing   Problem: Clinical Measurements: Goal: Diagnostic test results will improve Outcome: Progressing Goal: Signs and symptoms of infection will decrease Outcome: Progressing   Problem: Respiratory: Goal: Ability to maintain adequate ventilation will improve Outcome: Progressing

## 2024-05-29 NOTE — Progress Notes (Signed)
 Physical Therapy Re-Evaluation Patient Details Name: Jeffery Sweeney MRN: [id number] DOB: 1961/01/28 Today's Date: 05/29/2024  History of Present Illness  presented to ER secondary to abdominal pain; admitted for management of acute gallstone pancreatitis, s/p ERCP 1/7.  Currently with cholecystostomy tube placed 12/12 during recent admission (12/10-16).  Cholecystectomy scheduled for February  Clinical Impression  Patient seen for re-evaluation following change in status overnight. Patient requiring up to 4L O2 during activity to maintain spO2 >88%. Required CGA for bed mobility and minA for sit to stand from EOB with no AD 2/2 posterior LOB. Took several steps towards recliner with CGA and HHAx1. Fatigued after minimal activity. Patient will benefit from skilled PT services during acute stay to address listed deficits. Patient will benefit from ongoing therapy at discharge to maximize functional independence and safety.          If plan is discharge home, recommend the following: A little help with walking and/or transfers;A little help with bathing/dressing/bathroom;Assistance with cooking/housework;Help with stairs or ramp for entrance;Assist for transportation   Can travel by private vehicle   Yes    Equipment Recommendations Rolling Jeffery Sweeney (2 wheels)  Recommendations for Other Services       Functional Status Assessment Patient has had a recent decline in their functional status and demonstrates the ability to make significant improvements in function in a reasonable and predictable amount of time.     Precautions / Restrictions Precautions Precautions: Fall Restrictions Weight Bearing Restrictions Per Provider Order: No      Mobility  Bed Mobility Overal bed mobility: Needs Assistance Bed Mobility: Supine to Sit     Supine to sit: Contact guard          Transfers Overall transfer level: Needs assistance Equipment used: None, 1 person hand held  assist Transfers: Sit to/from Stand, Bed to chair/wheelchair/BSC Sit to Stand: Min assist   Step pivot transfers: Contact guard assist       General transfer comment: assist to steady upon standing due to posterior LOB. Completed transfer to recliner with HHA. SpO2 dropped to 86% after increase to 4L from 2L at beginning of session    Ambulation/Gait                  Stairs            Wheelchair Mobility     Tilt Bed    Modified Rankin (Stroke Patients Only)       Balance Overall balance assessment: Needs assistance Sitting-balance support: Feet supported Sitting balance-Leahy Scale: Good     Standing balance support: Single extremity supported, During functional activity Standing balance-Leahy Scale: Fair                               Pertinent Vitals/Pain Pain Assessment Pain Assessment: Faces Faces Pain Scale: Hurts little more Pain Location: abdomen Pain Descriptors / Indicators: Discomfort, Sore Pain Intervention(s): Limited activity within patient's tolerance, Monitored during session, Repositioned    Home Living Family/patient expects to be discharged to:: Private residence Living Arrangements: Alone Available Help at Discharge: Family;Available PRN/intermittently Type of Home: House Home Access: Stairs to enter Entrance Stairs-Rails: [employer] of Steps: 9 steps Alternate Level Stairs-Number of Steps: 9 steps to floor with bed/bathroom Home Layout: Multi-level Home Equipment: Grab bars - toilet      Prior Function Prior Level of Function : Independent/Modified Independent;Driving;History of Falls (last six months)  Mobility Comments: IND ambulator without AD; + driving ADLs Comments: IND with ADL's and IADL's     Extremity/Trunk Assessment   Upper Extremity Assessment Upper Extremity Assessment: Defer to OT evaluation    Lower Extremity Assessment Lower Extremity Assessment:  Generalized weakness       Communication   Communication Communication: No apparent difficulties    Cognition Arousal: Alert Behavior During Therapy: WFL for tasks assessed/performed   PT - Cognitive impairments: No family/caregiver present to determine baseline                         Following commands: Intact       Cueing Cueing Techniques: Verbal cues     General Comments General comments (skin integrity, edema, etc.): Initially on 5L O2 on arrival, slowly titrated down to 2L at rest with spO2 97% throughout. With activity, SOB noted with inability to obtained good pleth so increased to 4L with spO2 reading as low as 88%    Exercises     Assessment/Plan    PT Assessment Patient needs continued PT services  PT Problem List         PT Treatment Interventions DME instruction;Gait training;Stair training;Functional mobility training;Therapeutic activities;Therapeutic exercise;Balance training;Neuromuscular re-education;Patient/family education;Cognitive remediation    PT Goals (Current goals can be found in the Care Plan section)  Acute Rehab PT Goals Patient Stated Goal: to get better and go home PT Goal Formulation: With patient Time For Goal Achievement: 06/12/24 Potential to Achieve Goals: Good    Frequency Min 2X/week     Co-evaluation               AM-PAC PT 6 Clicks Mobility  Outcome Measure Help needed turning from your back to your side while in a flat bed without using bedrails?: A Little Help needed moving from lying on your back to sitting on the side of a flat bed without using bedrails?: A Little Help needed moving to and from a bed to a chair (including a wheelchair)?: A Little Help needed standing up from a chair using your arms (e.g., wheelchair or bedside chair)?: A Little Help needed to walk in hospital room?: A Little Help needed climbing 3-5 steps with a railing? : A Little 6 Click Score: 18    End of Session Equipment  Utilized During Treatment: Oxygen Activity Tolerance: Patient tolerated treatment well Patient left: in chair;with call bell/phone within reach;with chair alarm set Nurse Communication: Mobility status      Time: 8866-8846 PT Time Calculation (min) (ACUTE ONLY): 20 min   Charges:   PT Evaluation $PT Re-evaluation: 1 Re-eval   PT General Charges $$ ACUTE PT VISIT: 1 Visit         Maryanne Finder, PT, DPT Physical Therapist - [facility]  [facility]   Reesha Debes A Rishab Stoudt 05/29/2024, 12:54 PM

## 2024-05-29 NOTE — Consult Note (Signed)
 PHARMACY - PHYSICIAN COMMUNICATION CRITICAL VALUE ALERT - BLOOD CULTURE IDENTIFICATION (BCID)  Jeffery Sweeney is an 64 y.o. male who presented to [facility] on 05/24/2024 with a chief complaint of abdominal pain  Assessment:  lab called, and BCID machine is broken. Pt is growing 4 out 4 bottles GNR. culture will be sent to [facility] for further testing.   Name of physician (or Provider) Contacted: Williams  Current antibiotics: cefepime  + flagyl  + vancomycin   Changes to prescribed antibiotics recommended: Recommended to stop vancomycin   and continue cefepime  and flagyl .  Recommendations accepted by provider  Results for orders placed or performed during the hospital encounter of 12/28/21  Blood Culture ID Panel (Reflexed) (Collected: 12/28/2021  7:30 PM)  Result Value Ref Range   Enterococcus faecalis NOT DETECTED NOT DETECTED   Enterococcus Faecium NOT DETECTED NOT DETECTED   Listeria monocytogenes NOT DETECTED NOT DETECTED   Staphylococcus species NOT DETECTED NOT DETECTED   Staphylococcus aureus (BCID) NOT DETECTED NOT DETECTED   Staphylococcus epidermidis NOT DETECTED NOT DETECTED   Staphylococcus lugdunensis NOT DETECTED NOT DETECTED   Streptococcus species NOT DETECTED NOT DETECTED   Streptococcus agalactiae NOT DETECTED NOT DETECTED   Streptococcus pneumoniae NOT DETECTED NOT DETECTED   Streptococcus pyogenes NOT DETECTED NOT DETECTED   A.calcoaceticus-baumannii NOT DETECTED NOT DETECTED   Bacteroides fragilis NOT DETECTED NOT DETECTED   Enterobacterales DETECTED (A) NOT DETECTED   Enterobacter cloacae complex DETECTED (A) NOT DETECTED   Escherichia coli NOT DETECTED NOT DETECTED   Klebsiella aerogenes NOT DETECTED NOT DETECTED   Klebsiella oxytoca NOT DETECTED NOT DETECTED   Klebsiella pneumoniae NOT DETECTED NOT DETECTED   Proteus species NOT DETECTED NOT DETECTED   Salmonella species NOT DETECTED NOT DETECTED   Serratia marcescens NOT DETECTED NOT DETECTED    Haemophilus influenzae NOT DETECTED NOT DETECTED   Neisseria meningitidis NOT DETECTED NOT DETECTED   Pseudomonas aeruginosa NOT DETECTED NOT DETECTED   Stenotrophomonas maltophilia NOT DETECTED NOT DETECTED   Candida albicans NOT DETECTED NOT DETECTED   Candida auris NOT DETECTED NOT DETECTED   Candida glabrata NOT DETECTED NOT DETECTED   Candida krusei NOT DETECTED NOT DETECTED   Candida parapsilosis NOT DETECTED NOT DETECTED   Candida tropicalis NOT DETECTED NOT DETECTED   Cryptococcus neoformans/gattii NOT DETECTED NOT DETECTED   CTX-M ESBL NOT DETECTED NOT DETECTED   Carbapenem resistance IMP NOT DETECTED NOT DETECTED   Carbapenem resistance KPC NOT DETECTED NOT DETECTED   Carbapenem resistance NDM NOT DETECTED NOT DETECTED   Carbapenem resist OXA 48 LIKE NOT DETECTED NOT DETECTED   Carbapenem resistance VIM NOT DETECTED NOT DETECTED    Cathaleen GORMAN Blanch 05/29/2024  9:55 AM

## 2024-05-29 NOTE — Plan of Care (Signed)
   Problem: Education: Goal: Knowledge of General Education information will improve Description Including pain rating scale, medication(s)/side effects and non-pharmacologic comfort measures Outcome: Progressing

## 2024-05-30 DIAGNOSIS — K8030 Calculus of bile duct with cholangitis, unspecified, without obstruction: Secondary | ICD-10-CM

## 2024-05-30 DIAGNOSIS — K859 Acute pancreatitis without necrosis or infection, unspecified: Secondary | ICD-10-CM

## 2024-05-30 DIAGNOSIS — R7881 Bacteremia: Secondary | ICD-10-CM

## 2024-05-30 DIAGNOSIS — K8060 Calculus of gallbladder and bile duct with cholecystitis, unspecified, without obstruction: Secondary | ICD-10-CM

## 2024-05-30 DIAGNOSIS — B961 Klebsiella pneumoniae [K. pneumoniae] as the cause of diseases classified elsewhere: Secondary | ICD-10-CM

## 2024-05-30 LAB — CBC
HCT: 31.9 % — ABNORMAL LOW (ref 39.0–52.0)
Hemoglobin: 10.6 g/dL — ABNORMAL LOW (ref 13.0–17.0)
MCH: 29.2 pg (ref 26.0–34.0)
MCHC: 33.2 g/dL (ref 30.0–36.0)
MCV: 87.9 fL (ref 80.0–100.0)
Platelets: 114 K/uL — ABNORMAL LOW (ref 150–400)
RBC: 3.63 MIL/uL — ABNORMAL LOW (ref 4.22–5.81)
RDW: 13.2 % (ref 11.5–15.5)
WBC: 9.8 K/uL (ref 4.0–10.5)
nRBC: 0 % (ref 0.0–0.2)

## 2024-05-30 LAB — BASIC METABOLIC PANEL WITH GFR
Anion gap: 10 (ref 5–15)
BUN: 14 mg/dL (ref 8–23)
CO2: 20 mmol/L — ABNORMAL LOW (ref 22–32)
Calcium: 7.8 mg/dL — ABNORMAL LOW (ref 8.9–10.3)
Chloride: 107 mmol/L (ref 98–111)
Creatinine, Ser: 0.69 mg/dL (ref 0.61–1.24)
GFR, Estimated: >60 mL/min
Glucose, Bld: 93 mg/dL (ref 70–99)
Potassium: 3.7 mmol/L (ref 3.5–5.1)
Sodium: 137 mmol/L (ref 135–145)

## 2024-05-30 LAB — BLOOD CULTURE ID PANEL (REFLEXED) - BCID2

## 2024-05-30 LAB — GLUCOSE, CAPILLARY
Glucose-Capillary: 88 mg/dL (ref 70–99)
Glucose-Capillary: 85 mg/dL (ref 70–99)
Glucose-Capillary: 88 mg/dL (ref 70–99)
Glucose-Capillary: 92 mg/dL (ref 70–99)
Glucose-Capillary: 96 mg/dL (ref 70–99)

## 2024-05-30 MED ORDER — ENSURE PLUS HIGH PROTEIN PO LIQD
237.0000 mL | Freq: Two times a day (BID) | ORAL | Status: DC
  Administered 2024-05-30 – 2024-06-02 (×3): 237 mL via ORAL

## 2024-05-30 MED ORDER — ALUM & MAG HYDROXIDE-SIMETH 200-200-20 MG/5ML PO SUSP
30.0000 mL | ORAL | Status: DC | PRN
  Administered 2024-05-30 – 2024-06-01 (×3): 30 mL via ORAL
  Filled 2024-05-30 (×3): qty 30

## 2024-05-30 NOTE — Plan of Care (Signed)
   Problem: Education: Goal: Ability to describe self-care measures that may prevent or decrease complications (Diabetes Survival Skills Education) will improve Outcome: Progressing   Problem: Education: Goal: Individualized Educational Video(s) Outcome: Progressing   Problem: Coping: Goal: Ability to adjust to condition or change in health will improve Outcome: Progressing   Problem: Fluid Volume: Goal: Ability to maintain a balanced intake and output will improve Outcome: Progressing   Problem: Health Behavior/Discharge Planning: Goal: Ability to identify and utilize available resources and services will improve Outcome: Progressing   Problem: Health Behavior/Discharge Planning: Goal: Ability to manage health-related needs will improve Outcome: Progressing

## 2024-05-30 NOTE — Progress Notes (Signed)
 " PROGRESS NOTE    Jeffery Sweeney  FMW:[id number] DOB: [birth year] DOA: 05/24/2024 PCP: Towana Small, FNP  Assessment & Plan:   Principal Problem:   Acute gallstone pancreatitis Active Problems:   History of acute cholecystitis s/p percutaneous insertion of cholecystostomy tube 04/29/2024   CAD with history of CABG/Stent   Common bile duct stone   Diabetes mellitus without complication (HCC)   Essential hypertension   History of CVA (cerebrovascular accident)  Assessment and Plan: Acute gallstone pancreatitis: s/p percutaneous cholecystostomy 04/29/2024 for acute cholecystitis. S/p ERCP in which bile duct could not be cannulated but a small stone did come out with manipulation of the ampulla as per GI on 05/25/24. Cholecystectomy is scheduled in Feb 2026 sometime and gen surg (Dr. Desiderio) will keep previously scheduled surg date. S/p percutaneous transhepatic cholangiogram w/ biliary drain placement. Drain is still draining blood.  Choledocholithiasis: as per MRCP. S/p ERCP in which the bile duct could not be cannulated but a small stone did come out with manipulation of the ampulla as per GI on 05/25/24. S/p percutaneous transhepatic cholangiogram w/ biliary drain placement on 05/26/24 as per IR. Drain is still draining blood. H&H are trending down slightly.   Bacteremia: blood cxs growing klebsiella pneumoniae, sens pending. Continue on IV cefepime , flagyl . ID consulted    Hx of CVA:  continue on aspirin , statin    HTN: continue on home dose of metoprolol     DM2: well controlled, HbA1c 6.6. Continue on SSI w/ accuchecks    Bed bugs: s/p premethrin. Continue w/ contact precautions   Hx of CAD: w/ hx of CABG and stent. Holding imdur . Continue on metoprolol , aspirin , statin.  Hold anti-HTN meds for MAP < 65  Obesity: BMI 31.6. Would benefit from weight loss    DVT prophylaxis: SCDs Code Status: full  Family Communication:  Disposition Plan: likely d/c back home   Level of  care: Progressive Consultants:  GI   Procedures:  Antimicrobials:   Subjective: Pt c/o abd pain   Objective: Vitals:   05/30/24 0300 05/30/24 0400 05/30/24 0500 05/30/24 0730  BP: 109/68 106/68 120/74 133/75  Pulse: 81 79 83   Resp:      Temp:    98.1 F (36.7 C)  TempSrc:    Oral  SpO2: 95% 95% 94%   Weight:      Height:        Intake/Output Summary (Last 24 hours) at 05/30/2024 0929 Last data filed at 05/30/2024 0300 Gross per 24 hour  Intake 3523.57 ml  Output 1110 ml  Net 2413.57 ml   Filed Weights   05/24/24 1833  Weight: 81 kg    Examination:  General exam: appears uncomfortable. Diaphoretic  Respiratory system: decreased breath sounds b/l  Cardiovascular system: S1/S2+. No rubs or clicks   Gastrointestinal system: abd is soft, tenderness to palpation, obese, hypoactive bowel sounds. 2 drains in place Central nervous system: alert & awake. Moves all extremities  Psychiatry: Judgement and insight appears not at baseline. Flat mood and affect    Data Reviewed: I have personally reviewed following labs and imaging studies  CBC: Recent Labs  Lab 05/27/24 1325 05/28/24 1106 05/28/24 2027 05/29/24 0448 05/30/24 0423  WBC 4.0 9.0 2.3* 11.8* 9.8  NEUTROABS  --   --  1.8  --   --   HGB 13.1 12.4* 12.2* 11.0* 10.6*  HCT 38.0* 36.4* 36.3* 32.8* 31.9*  MCV 86.2 86.7 86.8 87.7 87.9  PLT 180 181 140* 135* 114*  Basic Metabolic Panel: Recent Labs  Lab 05/27/24 1325 05/28/24 1106 05/28/24 2027 05/29/24 0448 05/30/24 0423  NA 137 136 135 134* 137  K 3.6 3.8 3.5 3.7 3.7  CL 104 102 101 105 107  CO2 22 23 21* 19* 20*  GLUCOSE 89 122* 114* 135* 93  BUN 7* 12 16 16 14   CREATININE 0.73 0.90 1.14 0.92 0.69  CALCIUM  9.0 8.7* 8.6* 8.3* 7.8*  MG  --   --  1.5*  --   --    GFR: Estimated Creatinine Clearance: 87.7 mL/min (by C-G formula based on SCr of 0.69 mg/dL). Liver Function Tests: Recent Labs  Lab 05/26/24 0508 05/27/24 1325 05/28/24 1106  05/28/24 2027 05/29/24 0448  AST 85* 67* 25 26 37  ALT 122* 88* 56* 50* 44  ALKPHOS 120 149* 120 152* 98  BILITOT 1.6* 1.4* 1.0 1.4* 1.2  PROT 6.4* 7.1 7.2 7.1 6.4*  ALBUMIN  3.2* 3.5 3.4* 3.4* 3.0*   Recent Labs  Lab 05/24/24 1841 05/25/24 0426  LIPASE 1,518* 161*   No results for input(s): AMMONIA in the last 168 hours. Coagulation Profile: Recent Labs  Lab 05/26/24 1557 05/28/24 2027  INR 1.1 1.2   Cardiac Enzymes: No results for input(s): CKTOTAL, CKMB, CKMBINDEX, TROPONINI in the last 168 hours. BNP (last 3 results) No results for input(s): PROBNP in the last 8760 hours. HbA1C: No results for input(s): HGBA1C in the last 72 hours. CBG: Recent Labs  Lab 05/29/24 1213 05/29/24 1715 05/29/24 2013 05/29/24 2330 05/30/24 0829  GLUCAP 104* 78 76 98 88   Lipid Profile: No results for input(s): CHOL, HDL, LDLCALC, TRIG, CHOLHDL, LDLDIRECT in the last 72 hours. Thyroid  Function Tests: No results for input(s): TSH, T4TOTAL, FREET4, T3FREE, THYROIDAB in the last 72 hours. Anemia Panel: No results for input(s): VITAMINB12, FOLATE, FERRITIN, TIBC, IRON, RETICCTPCT in the last 72 hours. Sepsis Labs: Recent Labs  Lab 05/28/24 2027 05/28/24 2222  LATICACIDVEN 2.6* 1.8    Recent Results (from the past 240 hours)  Culture, blood (Routine X 2) w Reflex to ID Panel     Status: None (Preliminary result)   Collection Time: 05/27/24  1:24 PM   Specimen: BLOOD  Result Value Ref Range Status   Specimen Description BLOOD BLOOD RIGHT ARM  Final   Special Requests   Final    BOTTLES DRAWN AEROBIC AND ANAEROBIC Blood Culture adequate volume   Culture   Final    NO GROWTH 3 DAYS Performed at [facility], [address], [place], [place] [address]    Report Status PENDING  Incomplete  Culture, blood (Routine X 2) w Reflex to ID Panel     Status: None (Preliminary result)   Collection Time: 05/27/24  1:24 PM    Specimen: BLOOD  Result Value Ref Range Status   Specimen Description BLOOD BLOOD RIGHT HAND  Final   Special Requests   Final    BOTTLES DRAWN AEROBIC AND ANAEROBIC Blood Culture adequate volume   Culture   Final    NO GROWTH 3 DAYS Performed at [facility], [address], [place], [place] [address]    Report Status PENDING  Incomplete  Culture, blood (Routine X 2) w Reflex to ID Panel     Status: None (Preliminary result)   Collection Time: 05/28/24  8:58 PM   Specimen: BLOOD  Result Value Ref Range Status   Specimen Description BLOOD BLOOD RIGHT ARM  Final   Special Requests   Final  BOTTLES DRAWN AEROBIC AND ANAEROBIC Blood Culture adequate volume   Culture  Setup Time   Final    GRAM NEGATIVE RODS IN BOTH AEROBIC AND ANAEROBIC BOTTLES CRITICAL RESULT CALLED TO, READ BACK BY AND VERIFIED WITH: CATHALEEN BLANCH 05/29/24 0841 MW Performed at [facility], [address] [place], [place] [address]    Culture GRAM NEGATIVE RODS  Final   Report Status PENDING  Incomplete  Culture, blood (Routine X 2) w Reflex to ID Panel     Status: None (Preliminary result)   Collection Time: 05/28/24  9:00 PM   Specimen: BLOOD  Result Value Ref Range Status   Specimen Description   Final    BLOOD BLOOD RIGHT HAND Performed at [facility], [address] [place], [place] [address]    Special Requests   Final    BOTTLES DRAWN AEROBIC AND ANAEROBIC Blood Culture adequate volume Performed at [facility], [address] [place], [place] [address]    Culture  Setup Time   Final    Organism ID to follow GRAM NEGATIVE RODS IN BOTH AEROBIC AND ANAEROBIC BOTTLES CRITICAL RESULT CALLED TO, READ BACK BY AND VERIFIED WITH: CATHALEEN PATEL 05/29/24 0841 MW CRITICAL RESULT CALLED TO, READ BACK BY AND VERIFIED WITH: RN AMBER PAYNE [id number] AT 1248 BY EC Performed at [facility], [address] [place], [place] [address]    Culture GRAM NEGATIVE RODS  Final    Report Status PENDING  Incomplete  Blood Culture ID Panel (Reflexed)     Status: Abnormal   Collection Time: 05/28/24  9:00 PM  Result Value Ref Range Status   Enterococcus faecalis NOT DETECTED NOT DETECTED Final   Enterococcus Faecium NOT DETECTED NOT DETECTED Final   Listeria monocytogenes NOT DETECTED NOT DETECTED Final   Staphylococcus species NOT DETECTED NOT DETECTED Final   Staphylococcus aureus (BCID) NOT DETECTED NOT DETECTED Final   Staphylococcus epidermidis NOT DETECTED NOT DETECTED Final   Staphylococcus lugdunensis NOT DETECTED NOT DETECTED Final   Streptococcus species NOT DETECTED NOT DETECTED Final   Streptococcus agalactiae NOT DETECTED NOT DETECTED Final   Streptococcus pneumoniae NOT DETECTED NOT DETECTED Final   Streptococcus pyogenes NOT DETECTED NOT DETECTED Final   A.calcoaceticus-baumannii NOT DETECTED NOT DETECTED Final   Bacteroides fragilis NOT DETECTED NOT DETECTED Final   Enterobacterales DETECTED (A) NOT DETECTED Final    Comment: Enterobacterales represent a large order of gram negative bacteria, not a single organism. CRITICAL RESULT CALLED TO, READ BACK BY AND VERIFIED WITH: PHARMD AMBER PAYNE [id number] AT 1248 BY EC    Enterobacter cloacae complex NOT DETECTED NOT DETECTED Final   Escherichia coli NOT DETECTED NOT DETECTED Final   Klebsiella aerogenes NOT DETECTED NOT DETECTED Final   Klebsiella oxytoca NOT DETECTED NOT DETECTED Final   Klebsiella pneumoniae DETECTED (A) NOT DETECTED Final    Comment: CRITICAL RESULT CALLED TO, READ BACK BY AND VERIFIED WITH: PHARMD AMBER PAYNE [id number] AT 1248 BY EC    Proteus species NOT DETECTED NOT DETECTED Final   Salmonella species NOT DETECTED NOT DETECTED Final   Serratia marcescens NOT DETECTED NOT DETECTED Final   Haemophilus influenzae NOT DETECTED NOT DETECTED Final   Neisseria meningitidis NOT DETECTED NOT DETECTED Final   Pseudomonas aeruginosa NOT DETECTED NOT DETECTED Final   Stenotrophomonas  maltophilia NOT DETECTED NOT DETECTED Final   Candida albicans NOT DETECTED NOT DETECTED Final   Candida auris NOT DETECTED NOT DETECTED Final   Candida glabrata NOT  DETECTED NOT DETECTED Final   Candida krusei NOT DETECTED NOT DETECTED Final   Candida parapsilosis NOT DETECTED NOT DETECTED Final   Candida tropicalis NOT DETECTED NOT DETECTED Final   Cryptococcus neoformans/gattii NOT DETECTED NOT DETECTED Final   CTX-M ESBL NOT DETECTED NOT DETECTED Final   Carbapenem resistance IMP NOT DETECTED NOT DETECTED Final   Carbapenem resistance KPC NOT DETECTED NOT DETECTED Final   Carbapenem resistance NDM NOT DETECTED NOT DETECTED Final   Carbapenem resist OXA 48 LIKE NOT DETECTED NOT DETECTED Final   Carbapenem resistance VIM NOT DETECTED NOT DETECTED Final    Comment: Performed at [facility], [address] [place], [place] [address]         Radiology Studies: DG Chest Port 1 View Result Date: 05/28/2024 CLINICAL DATA:  Pneumonia EXAM: PORTABLE CHEST 1 VIEW COMPARISON:  11/30/2023 FINDINGS: Single frontal view of the chest demonstrates an unremarkable cardiac silhouette. Postsurgical changes from CABG. No acute airspace disease, effusion, or pneumothorax. Lung volumes are diminished. No acute bony abnormalities. Biliary drain overlies right upper quadrant. IMPRESSION: 1. Low lung volumes.  No acute intrathoracic process. Electronically Signed   By: Ozell Daring M.D.   On: 05/28/2024 20:59        Scheduled Meds:  aspirin   81 mg Oral Daily   feeding supplement  237 mL Oral BID BM   insulin  aspart  0-9 Units Subcutaneous Q4H   metoprolol  tartrate  50 mg Oral BID   pantoprazole   40 mg Oral Daily   polyethylene glycol  17 g Oral Daily   rosuvastatin   40 mg Oral Daily   Continuous Infusions:  ceFEPime  (MAXIPIME ) IV Stopped (05/30/24 0058)   metronidazole  Stopped (05/30/24 0131)      LOS: 4 days      Anthony CHRISTELLA Pouch, MD [facility] Pager [phone]  [phone]  If 7PM-7AM, please contact night-coverage [email or web address] 05/30/2024, 9:29 AM   "

## 2024-05-30 NOTE — Consult Note (Incomplete)
 NAME: Jeffery Sweeney  DOB: August 10, 1960  MRN: [id number]  Date/Time: 05/30/2024 12:59 PM  REQUESTING PROVIDER: Dr.Williams Subjective:  REASON FOR CONSULT: BActeremia ? Jeffery Sweeney is a 64 y.o. with a history of CVA, HTN, CAD S/p CABG, Ptent was recently in the hospital for acute cholecystitis between 12/10-12/16 and underwent cholecystostomy on 12/12 and bile culture was klebsiella pneumoniae discharged  home on Augmentin  presented to the ED on 1/6 with upper abdominal pain of 1 day duration Did not have N/V/D or fever   05/24/24 18:34  BP 133/83  Temp 97.5 F (36.4 C) !  Pulse Rate 87  Resp 18  SpO2 96 %  !: Data is abnormal  Latest Reference Range & Units 05/24/24 18:41  WBC 4.0 - 10.5 K/uL 9.9  Hemoglobin 13.0 - 17.0 g/dL 86.2  HCT 60.9 - 47.9 % 41.6  Platelets 150 - 400 K/uL 257  Creatinine 0.61 - 1.24 mg/dL 9.13  Abnormal LFTS and amylase 1/6 Lipase 1518 AST 504 ALT 201 TB 1.7 Alk po4 130 MRCP showed mimial dilatation of CBD, small stone CBD  ERCP attempted on 1/7 but not successful cannulkation- stone came out of the ampulla On 1/9 IR exchanged the perc cholecystostomy tube and instered transhepatc percutaneous biliary drain placement  Fever of 103.5 on 1/10 and BC sent Started on cefepime  and flagyl  and vanco Positive for klebsiella pneumonia I am seeing the patient  LFTS normalized Lipase much improved  Past Medical History:  Diagnosis Date   CAD (coronary artery disease)    a. PCI x 3 - [place] ; b. 06/2020 NSTEMI-->CABG x 3 (LIMA->LAD, VG->RI, VG->RPDA); c. 09/2021 STEMI/PCI: LM nl, LAD 80p/m, D1 90, lat D1 90, D2 80, LCX 13m/d, OM2 80, RCA 71m, 30d, RPDA 90, VG->prox RCA->RPDA 30p, VG->D1 mild dzs, LIMA->LAD 80 (DES)-->complicated by acute closure-->DES x 7; d. 03/2022 MV: No isch/infarct, EF 54%.   CVA (cerebral vascular accident) (HCC) 06/2021   Small acute/subacute infarcts in the left occipital lobe and   Diastolic dysfunction    a.  10/2022 Echo: EF 55-60%, no rwma, GrI DD, nl RV size/fxn, mild MR/AI.   Hyperlipidemia LDL goal <70    started on Repatha  09/26/2021, continue Crestor  40mg  daily   Hypertension    Multiple vessel coronary artery disease    Myocardial infarct (HCC)    x 3; PCI with single stent placement for each (in [place] )   NSTEMI (non-ST elevated myocardial infarction) (HCC) 06/2020   STEMI (ST elevation myocardial infarction) (HCC) 09/23/2021   LHC 09/23/2021 x 2   Tobacco use    2 cigarettes daily   Unstable angina (HCC)    LHC 09/23/2021 x 2; total 8 DES to LIMA    Past Surgical History:  Procedure Laterality Date   CORONARY ANGIOPLASTY WITH STENT PLACEMENT     in [place]  in 2018-19   CORONARY ARTERY BYPASS GRAFT N/A 07/09/2020   Procedure: CORONARY ARTERY BYPASS GRAFTING (CABG) x THREE , USING LEFT INTERNAL MAMMARY ARTERY, AND RIGHT LEG GREATER SAPHENOUS VEIN HARVESTED ENDOSCOPICALLY;  Surgeon: Army Dallas NOVAK, MD;  Location: [facility] OR;  Service: Open Heart Surgery;  Laterality: N/A;   CORONARY PRESSURE/FFR STUDY N/A 07/06/2020   Procedure: INTRAVASCULAR PRESSURE WIRE/FFR STUDY;  Surgeon: Darron Deatrice LABOR, MD;  Location: [facility] INVASIVE CV LAB;  Service: Cardiovascular;  Laterality: N/A;  LAD   CORONARY STENT INTERVENTION N/A 09/23/2021   Procedure: CORONARY STENT INTERVENTION;  Surgeon: Darron Deatrice LABOR, MD;  Location: [facility] LAB;  Service: Cardiovascular;  Laterality: N/A;   CORONARY/GRAFT ACUTE MI REVASCULARIZATION N/A 09/23/2021   Procedure: Coronary/Graft Acute MI Revascularization;  Surgeon: Darron Deatrice LABOR, MD;  Location: [facility] LAB;  Service: Cardiovascular;  Laterality: N/A;   ERCP N/A 05/25/2024   Procedure: ERCP, WITH INTERVENTION IF INDICATED;  Surgeon: Jinny Carmine, MD;  Location: [facility] ENDOSCOPY;  Service: Endoscopy;  Laterality: N/A;   IR EXCHANGE BILIARY DRAIN  05/27/2024   IR INT EXT BILIARY DRAIN WITH CHOLANGIOGRAM  05/27/2024   IR PERC CHOLECYSTOSTOMY  04/29/2024    LEFT HEART CATH AND CORONARY ANGIOGRAPHY N/A 07/06/2020   Procedure: LEFT HEART CATH AND CORONARY ANGIOGRAPHY;  Surgeon: Darron Deatrice LABOR, MD;  Location: [facility];  Service: Cardiovascular;  Laterality: N/A;   LEFT HEART CATH AND CORONARY ANGIOGRAPHY N/A 09/23/2021   Procedure: LEFT HEART CATH AND CORONARY ANGIOGRAPHY;  Surgeon: Darron Deatrice LABOR, MD;  Location: [facility];  Service: Cardiovascular;  Laterality: N/A;   LEFT HEART CATH AND CORS/GRAFTS ANGIOGRAPHY N/A 09/23/2021   Procedure: LEFT HEART CATH AND CORS/GRAFTS ANGIOGRAPHY;  Surgeon: Darron Deatrice LABOR, MD;  Location: [facility];  Service: Cardiovascular;  Laterality: N/A;   TEE WITHOUT CARDIOVERSION N/A 07/09/2020   Procedure: TRANSESOPHAGEAL ECHOCARDIOGRAM (TEE);  Surgeon: Army Dallas NOVAK, MD;  Location: [facility];  Service: Open Heart Surgery;  Laterality: N/A;   WRIST SURGERY Left    for cyst    Social History   Socioeconomic History   Marital status: Divorced    Spouse name: Not on file   Number of children: Not on file   Years of education: Not on file   Highest education level: Not on file  Occupational History   Not on file  Tobacco Use   Smoking status: Some Days    Current packs/day: 0.00    Average packs/day: 0.5 packs/day    Types: Cigarettes    Last attempt to quit: 07/06/2020    Years since quitting: 3.9    Passive exposure: Past   Smokeless tobacco: Never   Tobacco comments:    [employer] info given. 11/17/23  Vaping Use   Vaping status: Never Used  Substance and Sexual Activity   Alcohol use: Not Currently   Drug use: Not Currently   Sexual activity: Not Currently  Other Topics Concern   Not on file  Social History Narrative   Not on file   Social Drivers of Health   Tobacco Use: High Risk (05/25/2024)   Patient History    Smoking Tobacco Use: Some Days    Smokeless Tobacco Use: Never    Passive Exposure: Past  Financial Resource Strain: Not on file  Food Insecurity: Food  Insecurity Present (05/25/2024)   Epic    Worried About Programme Researcher, Broadcasting/film/video in the Last Year: Sometimes true    [employer] of Food in the Last Year: Sometimes true  Transportation Needs: No Transportation Needs (05/25/2024)   Epic    Lack of Transportation (Medical): No    Lack of Transportation (Non-Medical): No  Physical Activity: Not on file  Stress: Not on file  Social Connections: Patient Declined (04/28/2024)   Social Connection and Isolation Panel    Frequency of Communication with Friends and Family: Patient declined    Frequency of Social Gatherings with Friends and Family: Patient declined    Attends Religious Services: Patient declined    Database Administrator or Organizations: Patient declined    Attends Banker Meetings: Patient declined  Marital Status: Patient declined  Intimate Partner Violence: Not At Risk (05/25/2024)   Epic    Fear of Current or Ex-Partner: No    Emotionally Abused: No    Physically Abused: No    Sexually Abused: No  Depression (PHQ2-9): Low Risk (05/04/2024)   Depression (PHQ2-9)    PHQ-2 Score: 3  Alcohol Screen: Not on file  Housing: Low Risk (05/25/2024)   Epic    Unable to Pay for Housing in the Last Year: No    Number of Times Moved in the Last Year: 1    Homeless in the Last Year: No  Utilities: Not At Risk (05/25/2024)   Epic    Threatened with loss of utilities: No  Health Literacy: Not on file    Family History  Problem Relation Age of Onset   Diabetes Mellitus II Father    Allergies[1] I? Current Facility-Administered Medications  Medication Dose Route Frequency Provider Last Rate Last Admin   acetaminophen  (TYLENOL ) tablet 650 mg  650 mg Oral Q6H PRN Jinny Carmine, MD   650 mg at 05/26/24 9165   Or   acetaminophen  (TYLENOL ) suppository 650 mg  650 mg Rectal Q6H PRN Jinny Carmine, MD       albuterol  (PROVENTIL ) (2.5 MG/3ML) 0.083% nebulizer solution 2.5 mg  2.5 mg Nebulization Q2H PRN Jinny Carmine, MD       aspirin   chewable tablet 81 mg  81 mg Oral Daily Jinny Carmine, MD   81 mg at 05/30/24 1018   ceFEPIme  (MAXIPIME ) 2 g in sodium chloride  0.9 % 100 mL IVPB  2 g Intravenous Q8H Trudy Anthony HERO, MD 200 mL/hr at 05/30/24 1027 2 g at 05/30/24 1027   feeding supplement (ENSURE PLUS HIGH PROTEIN) liquid 237 mL  237 mL Oral BID BM Trudy Anthony HERO, MD   237 mL at 05/30/24 1019   HYDROcodone -acetaminophen  (NORCO/VICODIN) 5-325 MG per tablet 1-2 tablet  1-2 tablet Oral Q4H PRN Jinny Carmine, MD   2 tablet at 05/30/24 1018   HYDROmorphone  (DILAUDID ) injection 1 mg  1 mg Intravenous Q2H PRN Williams, Jamiese M, MD   1 mg at 05/29/24 2025   insulin  aspart (novoLOG ) injection 0-9 Units  0-9 Units Subcutaneous Q4H Jinny Carmine, MD   1 Units at 05/29/24 0441   LORazepam  (ATIVAN ) injection 1 mg  1 mg Intravenous Q4H PRN Jinny Carmine, MD       magnesium  hydroxide (MILK OF MAGNESIA) suspension 30 mL  30 mL Oral Daily PRN Mansy, Jan A, MD       meclizine  (ANTIVERT ) tablet 50 mg  50 mg Oral BID PRN Jinny Carmine, MD       metoprolol  tartrate (LOPRESSOR ) tablet 50 mg  50 mg Oral BID Wohl, Darren, MD   50 mg at 05/30/24 1018   metroNIDAZOLE  (FLAGYL ) IVPB 500 mg  500 mg Intravenous Q12H Mansy, Jan A, MD 100 mL/hr at 05/30/24 1234 500 mg at 05/30/24 1234   nitroGLYCERIN  (NITROSTAT ) SL tablet 0.4 mg  0.4 mg Sublingual Q5 min PRN Jinny Carmine, MD       ondansetron  (ZOFRAN ) tablet 4 mg  4 mg Oral Q6H PRN Jinny Carmine, MD       Or   ondansetron  (ZOFRAN ) injection 4 mg  4 mg Intravenous Q6H PRN Jinny Carmine, MD       pantoprazole  (PROTONIX ) EC tablet 40 mg  40 mg Oral Daily Wohl, Darren, MD   40 mg at 05/30/24 1018   phenol (CHLORASEPTIC) mouth spray 1  spray  1 spray Mouth/Throat PRN Trudy Anthony HERO, MD   1 spray at 05/25/24 2335   polyethylene glycol (MIRALAX  / GLYCOLAX ) packet 17 g  17 g Oral Daily Mansy, Jan A, MD   17 g at 05/30/24 1019   rosuvastatin  (CRESTOR ) tablet 40 mg  40 mg Oral Daily Jinny Carmine, MD   40 mg at  05/30/24 1018   traZODone  (DESYREL ) tablet 25-50 mg  25-50 mg Oral QHS PRN Jinny Carmine, MD   50 mg at 05/27/24 2122     Abtx:  Anti-infectives (From admission, onward)    Start     Dose/Rate Route Frequency Ordered Stop   05/29/24 2200  vancomycin  (VANCOREADY) IVPB 1750 mg/350 mL  Status:  Discontinued        1,750 mg 175 mL/hr over 120 Minutes Intravenous Every 24 hours 05/28/24 2043 05/29/24 0925   05/29/24 0800  ceFEPIme  (MAXIPIME ) 2 g in sodium chloride  0.9 % 100 mL IVPB        2 g 200 mL/hr over 30 Minutes Intravenous Every 8 hours 05/28/24 2053     05/28/24 2115  ceFEPIme  (MAXIPIME ) 2 g in sodium chloride  0.9 % 100 mL IVPB        2 g 200 mL/hr over 30 Minutes Intravenous  Once 05/28/24 2028 05/29/24 0035   05/28/24 2115  metroNIDAZOLE  (FLAGYL ) IVPB 500 mg        500 mg 100 mL/hr over 60 Minutes Intravenous Every 12 hours 05/28/24 2028 06/04/24 2359   05/28/24 2115  vancomycin  (VANCOCIN ) IVPB 1000 mg/200 mL premix  Status:  Discontinued        1,000 mg 200 mL/hr over 60 Minutes Intravenous  Once 05/28/24 2028 05/28/24 2040   05/28/24 2100  vancomycin  (VANCOREADY) IVPB 2000 mg/400 mL        2,000 mg 200 mL/hr over 120 Minutes Intravenous Once 05/28/24 2040 05/29/24 0309   05/27/24 0600  cefOXitin  (MEFOXIN ) 2 g in sodium chloride  0.9 % 100 mL IVPB        2 g 200 mL/hr over 30 Minutes Intravenous To Radiology 05/27/24 0356 05/27/24 1045   05/27/24 0300  cefOXitin  (MEFOXIN ) 2 g in sodium chloride  0.9 % 100 mL IVPB  Status:  Discontinued        2 g 200 mL/hr over 30 Minutes Intravenous To Radiology 05/26/24 1545 05/27/24 0356       REVIEW OF SYSTEMS:  Const: fever,  chills, negative weight loss Eyes: negative diplopia or visual changes, negative eye pain ENT: negative coryza, negative sore throat Resp: negative cough, hemoptysis, dyspnea Cards: negative for chest pain, palpitations, lower extremity edema GU: negative for frequency, dysuria and hematuria GI: abdominal pain  worse after eating , diarrhea, no bleeding, constipation Skin: negative for rash and pruritus Heme: negative for easy bruising and gum/nose bleeding MS:   weakness Neurolo:negative for headaches, dizziness, vertigo, memory problems  Psych:  anxiety, depression  Endocrine: negative for thyroid , diabetes Allergy/Immunology- NKDA Objective:  VITALS:  BP 133/75 (BP Location: Right Arm)   Pulse 83   Temp 97.8 F (36.6 C) (Oral)   Resp 20   Ht 5' 3 (1.6 m)   Wt 81 kg   SpO2 94%   BMI 31.63 kg/m  LDA 2 drains rt upper quadrant PHYSICAL EXAM:  General: Alert, cooperative, no distress, appears stated age.  Head: Normocephalic, without obvious abnormality, atraumatic. Eyes: Conjunctivae clear, anicteric sclerae. Pupils are equal ENT Nares normal. No drainage or sinus tenderness. Lips, mucosa, and  tongue normal. No Thrush Neck: Supple, symmetrical, no adenopathy, thyroid : non tender no carotid bruit and no JVD. Back: No CVA tenderness. Lungs: Clear to auscultation bilaterally. No Wheezing or Rhonchi. No rales. Heart: Regular rate and rhythm, no murmur, rub or gallop. Abdomen: Soft, 2 drains rt upper quadrant- one hs bloody fluid, the other bile Extremities: atraumatic, no cyanosis. No edema. No clubbing Skin: No rashes or lesions. Or bruising Lymph: Cervical, supraclavicular normal. Neurologic: Grossly non-focal Pertinent Labs Lab Results CBC    Component Value Date/Time   WBC 9.8 05/30/2024 0423   RBC 3.63 (L) 05/30/2024 0423   HGB 10.6 (L) 05/30/2024 0423   HGB 16.8 07/10/2023 1141   HCT 31.9 (L) 05/30/2024 0423   HCT 49.7 07/10/2023 1141   PLT 114 (L) 05/30/2024 0423   PLT 262 07/10/2023 1141   MCV 87.9 05/30/2024 0423   MCV 87 07/10/2023 1141   MCH 29.2 05/30/2024 0423   MCHC 33.2 05/30/2024 0423   RDW 13.2 05/30/2024 0423   RDW 12.9 07/10/2023 1141   LYMPHSABS 0.4 (L) 05/28/2024 2027   LYMPHSABS 4.4 (H) 07/10/2023 1141   MONOABS 0.0 (L) 05/28/2024 2027   EOSABS  0.1 05/28/2024 2027   EOSABS 0.4 07/10/2023 1141   BASOSABS 0.0 05/28/2024 2027   BASOSABS 0.1 07/10/2023 1141       Latest Ref Rng & Units 05/30/2024    4:23 AM 05/29/2024    4:48 AM 05/28/2024    8:27 PM  CMP  Glucose 70 - 99 mg/dL 93  864  885   BUN 8 - 23 mg/dL 14  16  16    Creatinine 0.61 - 1.24 mg/dL 9.30  9.07  8.85   Sodium 135 - 145 mmol/L 137  134  135   Potassium 3.5 - 5.1 mmol/L 3.7  3.7  3.5   Chloride 98 - 111 mmol/L 107  105  101   CO2 22 - 32 mmol/L 20  19  21    Calcium  8.9 - 10.3 mg/dL 7.8  8.3  8.6   Total Protein 6.5 - 8.1 g/dL  6.4  7.1   Total Bilirubin 0.0 - 1.2 mg/dL  1.2  1.4   Alkaline Phos 38 - 126 U/L  98  152   AST 15 - 41 U/L  37  26   ALT 0 - 44 U/L  44  50       Microbiology: Recent Results (from the past 240 hours)  Culture, blood (Routine X 2) w Reflex to ID Panel     Status: None (Preliminary result)   Collection Time: 05/27/24  1:24 PM   Specimen: BLOOD  Result Value Ref Range Status   Specimen Description BLOOD BLOOD RIGHT ARM  Final   Special Requests   Final    BOTTLES DRAWN AEROBIC AND ANAEROBIC Blood Culture adequate volume   Culture   Final    NO GROWTH 3 DAYS Performed at [facility], [address], [place], [place] [address]    Report Status PENDING  Incomplete  Culture, blood (Routine X 2) w Reflex to ID Panel     Status: None (Preliminary result)   Collection Time: 05/27/24  1:24 PM   Specimen: BLOOD  Result Value Ref Range Status   Specimen Description BLOOD BLOOD RIGHT HAND  Final   Special Requests   Final    BOTTLES DRAWN AEROBIC AND ANAEROBIC Blood Culture adequate volume   Culture   Final    NO GROWTH 3 DAYS  Performed at [facility], [address] [place], [place] [address]    Report Status PENDING  Incomplete  Culture, blood (Routine X 2) w Reflex to ID Panel     Status: None (Preliminary result)   Collection Time: 05/28/24  8:58 PM   Specimen: BLOOD  Result Value Ref Range Status    Specimen Description BLOOD BLOOD RIGHT ARM  Final   Special Requests   Final    BOTTLES DRAWN AEROBIC AND ANAEROBIC Blood Culture adequate volume   Culture  Setup Time   Final    GRAM NEGATIVE RODS IN BOTH AEROBIC AND ANAEROBIC BOTTLES CRITICAL RESULT CALLED TO, READ BACK BY AND VERIFIED WITH: CATHALEEN BLANCH 05/29/24 0841 MW Performed at [facility], [address], [place], [place] [address]    Culture GRAM NEGATIVE RODS  Final   Report Status PENDING  Incomplete  Culture, blood (Routine X 2) w Reflex to ID Panel     Status: Abnormal (Preliminary result)   Collection Time: 05/28/24  9:00 PM   Specimen: BLOOD  Result Value Ref Range Status   Specimen Description   Final    BLOOD BLOOD RIGHT HAND Performed at [facility], [address], [place], [place] [address]    Special Requests   Final    BOTTLES DRAWN AEROBIC AND ANAEROBIC Blood Culture adequate volume Performed at [facility], [address] [place], [place] [address]    Culture  Setup Time   Final    Organism ID to follow GRAM NEGATIVE RODS IN BOTH AEROBIC AND ANAEROBIC BOTTLES CRITICAL RESULT CALLED TO, READ BACK BY AND VERIFIED WITH: CATHALEEN PATEL 05/29/24 0841 MW CRITICAL RESULT CALLED TO, READ BACK BY AND VERIFIED WITH: RN AMBER PAYNE [id number] AT 1248 BY EC    Culture (A)  Final    KLEBSIELLA PNEUMONIAE SUSCEPTIBILITIES TO FOLLOW Performed at [facility], [address], [place], [place] [address]    Report Status PENDING  Incomplete  Blood Culture ID Panel (Reflexed)     Status: Abnormal   Collection Time: 05/28/24  9:00 PM  Result Value Ref Range Status   Enterococcus faecalis NOT DETECTED NOT DETECTED Final   Enterococcus Faecium NOT DETECTED NOT DETECTED Final   Listeria monocytogenes NOT DETECTED NOT DETECTED Final   Staphylococcus species NOT DETECTED NOT DETECTED Final   Staphylococcus aureus (BCID) NOT DETECTED NOT DETECTED Final   Staphylococcus epidermidis NOT  DETECTED NOT DETECTED Final   Staphylococcus lugdunensis NOT DETECTED NOT DETECTED Final   Streptococcus species NOT DETECTED NOT DETECTED Final   Streptococcus agalactiae NOT DETECTED NOT DETECTED Final   Streptococcus pneumoniae NOT DETECTED NOT DETECTED Final   Streptococcus pyogenes NOT DETECTED NOT DETECTED Final   A.calcoaceticus-baumannii NOT DETECTED NOT DETECTED Final   Bacteroides fragilis NOT DETECTED NOT DETECTED Final   Enterobacterales DETECTED (A) NOT DETECTED Final    Comment: Enterobacterales represent a large order of gram negative bacteria, not a single organism. CRITICAL RESULT CALLED TO, READ BACK BY AND VERIFIED WITH: PHARMD AMBER PAYNE [id number] AT 1248 BY EC    Enterobacter cloacae complex NOT DETECTED NOT DETECTED Final   Escherichia coli NOT DETECTED NOT DETECTED Final   Klebsiella aerogenes NOT DETECTED NOT DETECTED Final   Klebsiella oxytoca NOT DETECTED NOT DETECTED Final   Klebsiella pneumoniae DETECTED (A) NOT DETECTED Final    Comment: CRITICAL RESULT CALLED TO, READ BACK BY AND VERIFIED WITH: PHARMD AMBER PAYNE [id number] AT 1248 BY EC  Proteus species NOT DETECTED NOT DETECTED Final   Salmonella species NOT DETECTED NOT DETECTED Final   Serratia marcescens NOT DETECTED NOT DETECTED Final   Haemophilus influenzae NOT DETECTED NOT DETECTED Final   Neisseria meningitidis NOT DETECTED NOT DETECTED Final   Pseudomonas aeruginosa NOT DETECTED NOT DETECTED Final   Stenotrophomonas maltophilia NOT DETECTED NOT DETECTED Final   Candida albicans NOT DETECTED NOT DETECTED Final   Candida auris NOT DETECTED NOT DETECTED Final   Candida glabrata NOT DETECTED NOT DETECTED Final   Candida krusei NOT DETECTED NOT DETECTED Final   Candida parapsilosis NOT DETECTED NOT DETECTED Final   Candida tropicalis NOT DETECTED NOT DETECTED Final   Cryptococcus neoformans/gattii NOT DETECTED NOT DETECTED Final   CTX-M ESBL NOT DETECTED NOT DETECTED Final   Carbapenem  resistance IMP NOT DETECTED NOT DETECTED Final   Carbapenem resistance KPC NOT DETECTED NOT DETECTED Final   Carbapenem resistance NDM NOT DETECTED NOT DETECTED Final   Carbapenem resist OXA 48 LIKE NOT DETECTED NOT DETECTED Final   Carbapenem resistance VIM NOT DETECTED NOT DETECTED Final    Comment: Performed at [facility], [address], [place], [place] [address]     Patient has: []  acute illness w/systemic sxs  [mod] [x]  illness posing risk to life or function  [high]  I reviewed:  (3+) []  primary team note [x]  consultant note(s) []  procedure/op note(s) []  micro result(s)   []  CBC results []  chemistry results []  radiology report(s) []  nursing note(s)  I independently visualized:  (any)   []  cxs/plates in lab []  plain film images [x]  CT images []  PET images   []  path slide(s) []  ECG tracing []  MRI images []  nuclear scan  I discussed: (any) []  micro and/or path w/lab personnel [x]  drug options and/or interactions w/ID pharmD   []  procedure/OR findings w/other MD(s) []  echo and/or imaging w/other MD(s)   []  mgm't w/attending(s) involved in case []  setting up home abx w/OPAT team  Mgm't requires: []  prescription drug(s)  [mod] []  intensive toxicity monitoring  [high]   IMAGING RESULTS:   I have personally reviewed the films Gallbladder wall thickening and edema with cholecystostomy in place MRI liver Minimal dilatation of the CBD with a small stone in distal CBD  ? Impression/Recommendation Ascending cholangitis due to CBD stone  Klebsiella pneumoniae bacteremia due to the above S/p IR placing biliary drain  Abnormal LFTS secondary to the above  Aeromonas bacteremia- can be true pathogen Continue Cefepime  and flagyl  May be able to de-escalate soon  Acute pancreatitis secondary to gall stone/CBD stone  Recent acute cholecystitis s/p Perc cholecystostomy    This consult involves complex antimicrobial management  Discussed the management with patient and the  care team and ID pharmacist ? _ ________________________________________________      [1] No Known Allergies

## 2024-05-30 NOTE — TOC Initial Note (Signed)
 Transition of Care [facility]) - Initial/Assessment Note    Patient Details  Name: Jeffery Sweeney MRN: [id number] Date of Birth: 1961/05/07  Transition of Care [facility]) CM/SW Contact:    Shasta DELENA Daring, RN Phone Number: 05/30/2024, 4:20 PM  Clinical Narrative:                 RNCM met with patient in hospital room. Introduced self and explained role. Patient alone in room. Stated he lives alone in a boarding house at [address]. Patient drives himself to appointments. Will call a friend for a ride home at discharge. Uses [facility], denies difficulty affording medication. Sister, April, lives in area. Son, Harden, lives in [place].  Patient does not have HH services. States, No one will come there.  Has walker and bedside commode.    Confirmed with patient that he does not have insurance right now. Notified leadership.  Patient is agreeable to SNF.      Expected Discharge Plan: Skilled Nursing Facility Barriers to Discharge: Continued Medical Work up   Patient Goals and CMS Choice            Expected Discharge Plan and Services In-house Referral: Clinical Social Work Discharge Planning Services: CM Consult   Living arrangements for the past 2 months: Boarding House                                      Prior Living Arrangements/Services Living arrangements for the past 2 months: [employer] Lives with:: Self Patient language and need for interpreter reviewed:: Yes Do you feel safe going back to the place where you live?: Yes      Need for Family Participation in Patient Care: Yes (Comment) Care giver support system in place?: Yes (comment)   Criminal Activity/Legal Involvement Pertinent to Current Situation/Hospitalization: No - Comment as needed  Activities of Daily Living   ADL Screening (condition at time of admission) Independently performs ADLs?: Yes (appropriate for developmental age) Is the patient deaf or have difficulty  hearing?: No Does the patient have difficulty seeing, even when wearing glasses/contacts?: No Does the patient have difficulty concentrating, remembering, or making decisions?: No  Permission Sought/Granted   Permission granted to share information with : Yes, Verbal Permission Granted  Share Information with NAME: April Aburto and Nickolis Diel     Permission granted to share info w Relationship: sister and son  Permission granted to share info w Contact Information: see chart  Emotional Assessment Appearance:: Appears stated age Attitude/Demeanor/Rapport: Gracious Affect (typically observed): Appropriate Orientation: : Oriented to Place, Oriented to Self, Oriented to  Time, Oriented to Situation Alcohol / Substance Use: Not Applicable Psych Involvement: No (comment)  Admission diagnosis:  Primary insomnia [F51.01] Vertigo [R42] Primary hypertension [I10] Transaminitis [R74.01] Hyperlipidemia LDL goal <70 [E78.5] Non-ST elevation (NSTEMI) myocardial infarction [facility]) [I21.4] Acute gallstone pancreatitis [K85.10] Acute pancreatitis, unspecified complication status, unspecified pancreatitis type [K85.90] Gastroesophageal reflux disease, unspecified whether esophagitis present [K21.9] Patient Active Problem List   Diagnosis Date Noted   Acute gallstone pancreatitis 05/25/2024   History of CVA (cerebrovascular accident) 05/25/2024   History of acute cholecystitis s/p percutaneous insertion of cholecystostomy tube 04/29/2024 05/25/2024   Dyslipidemia 04/28/2024   Coronary artery disease 04/28/2024   Diabetes mellitus without complication (HCC) 04/28/2024   Essential hypertension 04/28/2024   Common bile duct stone 04/27/2024   Uncontrolled type 2 diabetes mellitus with hyperglycemia (  HCC) 06/19/2023   Obesity (BMI 30-39.9) 12/30/2021   Possible acute cholecystitis 12/30/2021   Multifocal pneumonia 12/29/2021   Severe sepsis (HCC) 12/29/2021   Bacteremia due to Enterobacter  species 12/29/2021   Acute respiratory failure with hypoxia (HCC) 12/28/2021   Hyperlipidemia LDL goal <70 09/26/2021   Stroke (cerebrum) (HCC) 07/05/2021   Vertigo    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   Chest pain 07/06/2020   CAD with history of CABG/Stent 07/06/2020   3-vessel CAD 07/06/2020   Hypertension    Tobacco abuse    Non-ST elevation (NSTEMI) myocardial infarction [facility])    Unstable angina (HCC) 07/05/2020   PCP:  Towana Small, FNP Pharmacy:   [facility] [address] [place] [address] Phone: [phone] Fax: [phone]     Social Drivers of Health (SDOH) Social History: SDOH Screenings   Food Insecurity: Food Insecurity Present (05/25/2024)  Housing: Low Risk (05/25/2024)  Transportation Needs: No Transportation Needs (05/25/2024)  Utilities: Not At Risk (05/25/2024)  Depression (PHQ2-9): Low Risk (05/04/2024)  Social Connections: Patient Declined (04/28/2024)  Tobacco Use: High Risk (05/25/2024)   SDOH Interventions:     Readmission Risk Interventions    05/30/2024    4:15 PM  Readmission Risk Prevention Plan  Transportation Screening Complete  PCP or Specialist Appt within 5-7 Days --  Home Care Screening Complete  Medication Review (RN CM) Complete

## 2024-05-31 DIAGNOSIS — K859 Acute pancreatitis without necrosis or infection, unspecified: Secondary | ICD-10-CM

## 2024-05-31 DIAGNOSIS — K8309 Other cholangitis: Secondary | ICD-10-CM

## 2024-05-31 DIAGNOSIS — R7881 Bacteremia: Secondary | ICD-10-CM

## 2024-05-31 LAB — CBC
HCT: 38.1 % — ABNORMAL LOW (ref 39.0–52.0)
Hemoglobin: 12.9 g/dL — ABNORMAL LOW (ref 13.0–17.0)
MCH: 29 pg (ref 26.0–34.0)
MCHC: 33.9 g/dL (ref 30.0–36.0)
MCV: 85.6 fL (ref 80.0–100.0)
Platelets: 145 K/uL — ABNORMAL LOW (ref 150–400)
RBC: 4.45 MIL/uL (ref 4.22–5.81)
RDW: 12.9 % (ref 11.5–15.5)
WBC: 8.7 K/uL (ref 4.0–10.5)
nRBC: 0 % (ref 0.0–0.2)

## 2024-05-31 LAB — GLUCOSE, CAPILLARY
Glucose-Capillary: 91 mg/dL (ref 70–99)
Glucose-Capillary: 73 mg/dL (ref 70–99)
Glucose-Capillary: 133 mg/dL — ABNORMAL HIGH (ref 70–99)
Glucose-Capillary: 73 mg/dL (ref 70–99)
Glucose-Capillary: 108 mg/dL — ABNORMAL HIGH (ref 70–99)
Glucose-Capillary: 94 mg/dL (ref 70–99)

## 2024-05-31 LAB — BASIC METABOLIC PANEL WITH GFR
Anion gap: 10 (ref 5–15)
BUN: 11 mg/dL (ref 8–23)
CO2: 22 mmol/L (ref 22–32)
Calcium: 8.3 mg/dL — ABNORMAL LOW (ref 8.9–10.3)
Chloride: 102 mmol/L (ref 98–111)
Creatinine, Ser: 0.72 mg/dL (ref 0.61–1.24)
GFR, Estimated: >60 mL/min
Glucose, Bld: 106 mg/dL — ABNORMAL HIGH (ref 70–99)
Potassium: 3.7 mmol/L (ref 3.5–5.1)
Sodium: 135 mmol/L (ref 135–145)

## 2024-05-31 LAB — CULTURE, BLOOD (ROUTINE X 2): Special Requests: ADEQUATE

## 2024-05-31 LAB — LIPID PANEL
Cholesterol: 51 mg/dL (ref 0–200)
HDL: 14 mg/dL — ABNORMAL LOW
LDL Cholesterol: 15 mg/dL (ref 0–99)
Total CHOL/HDL Ratio: 3.7 ratio
Triglycerides: 113 mg/dL
VLDL: 23 mg/dL (ref 0–40)

## 2024-05-31 NOTE — Progress Notes (Signed)
 This patient is status post cholecystostomy and a PTC for acute cholecystitis with common bile duct stones.  The patient is being followed by ID for bacteremia.  The patient's liver enzymes when last checked were normal.  The patient will need a ERCP in 4 to 6 weeks with a stent removal and stone extraction at that time.  Nothing further to do at this time for this patient from a GI point of view.  I will sign off.  Please call if any further GI concerns or questions.  We would like to thank you for the opportunity to participate in the care of Jeffery Sweeney.

## 2024-05-31 NOTE — Progress Notes (Signed)
 Physical Therapy Treatment Patient Details Name: Jeffery Sweeney MRN: [id number] DOB: 19-Apr-1961 Today's Date: 05/31/2024   History of Present Illness presented to ER secondary to abdominal pain; admitted for management of acute gallstone pancreatitis, s/p ERCP 1/7.  Currently with cholecystostomy tube placed 12/12 during recent admission (12/10-16).  Cholecystectomy scheduled for February    PT Comments  Patient is agreeable to PT session with encouragement. He did progress hallway walking today to 42ft. He required intermittent physical assistance with mobility. Sp02 99% on room air. He reports feeling generally weak compared to his baseline and had pain with mobility. Recommend to continue PT to maximize independence and decrease caregiver burden.    If plan is discharge home, recommend the following: A little help with walking and/or transfers;A little help with bathing/dressing/bathroom;Assist for transportation;Help with stairs or ramp for entrance;Assistance with cooking/housework   Can travel by private vehicle     Yes  Equipment Recommendations  Rolling walker (2 wheels)    Recommendations for Other Services       Precautions / Restrictions Precautions Precautions: Fall Recall of Precautions/Restrictions: Impaired Precaution/Restrictions Comments: perc drain x 2 right flank Restrictions Weight Bearing Restrictions Per Provider Order: No     Mobility  Bed Mobility Overal bed mobility: Needs Assistance Bed Mobility: Supine to Sit, Sit to Supine     Supine to sit: Supervision Sit to supine: Min assist   General bed mobility comments: assistance for LE support to return to bed due to abdominal pain    Transfers Overall transfer level: Needs assistance Equipment used: Rolling walker (2 wheels) Transfers: Sit to/from Stand Sit to Stand: Supervision           General transfer comment: supervision for safety    Ambulation/Gait Ambulation/Gait assistance:  Contact guard assist Gait Distance (Feet): 75 Feet Assistive device: Rolling walker (2 wheels) Gait Pattern/deviations: Step-through pattern Gait velocity: decreased     General Gait Details: no loss of balance using rolling walker for support. patient reports he feels weaker than his baseline and not ready yet for the stairs. encouraged rolling walker for safety and fall prevention with mobility   Stairs             Wheelchair Mobility     Tilt Bed    Modified Rankin (Stroke Patients Only)       Balance Overall balance assessment: Needs assistance Sitting-balance support: Feet supported Sitting balance-Leahy Scale: Good     Standing balance support: Single extremity supported Standing balance-Leahy Scale: Fair Standing balance comment: stand by assistance for safety. no loss of balance while standing                            Communication Communication Communication: No apparent difficulties  Cognition Arousal: Alert Behavior During Therapy: WFL for tasks assessed/performed   PT - Cognitive impairments: No apparent impairments                         Following commands: Intact      Cueing Cueing Techniques: Verbal cues  Exercises      General Comments General comments (skin integrity, edema, etc.): Sp02 99% on room air. patient is fatigued with activity      Pertinent Vitals/Pain Pain Assessment Pain Assessment: 0-10 Pain Score: 8  Pain Location: drain site, right flank Pain Descriptors / Indicators: Discomfort Pain Intervention(s): Monitored during session, Limited activity within patient's tolerance, Repositioned  Home Living                          Prior Function            PT Goals (current goals can now be found in the care plan section) Acute Rehab PT Goals Patient Stated Goal: to get better and go home Progress towards PT goals: Progressing toward goals    Frequency    Min 2X/week       PT Plan      Co-evaluation              AM-PAC PT 6 Clicks Mobility   Outcome Measure  Help needed turning from your back to your side while in a flat bed without using bedrails?: A Little Help needed moving from lying on your back to sitting on the side of a flat bed without using bedrails?: A Little Help needed moving to and from a bed to a chair (including a wheelchair)?: A Little Help needed standing up from a chair using your arms (e.g., wheelchair or bedside chair)?: A Little Help needed to walk in hospital room?: A Little Help needed climbing 3-5 steps with a railing? : A Little 6 Click Score: 18    End of Session   Activity Tolerance: Patient limited by fatigue Patient left: in bed;with call bell/phone within reach;with bed alarm set Nurse Communication: Mobility status;Patient requests pain meds PT Visit Diagnosis: Muscle weakness (generalized) (M62.81)     Time: 8676-8649 PT Time Calculation (min) (ACUTE ONLY): 27 min  Charges:    $Therapeutic Activity: 23-37 mins PT General Charges $$ ACUTE PT VISIT: 1 Visit                     Jeffery Sweeney, PT, MPT    Jeffery Sweeney 05/31/2024, 1:56 PM

## 2024-05-31 NOTE — Plan of Care (Signed)
  Problem: Education: Goal: Ability to describe self-care measures that may prevent or decrease complications (Diabetes Survival Skills Education) will improve Outcome: Progressing Goal: Individualized Educational Video(s) Outcome: Progressing   Problem: Coping: Goal: Ability to adjust to condition or change in health will improve Outcome: Progressing   Problem: Fluid Volume: Goal: Ability to maintain a balanced intake and output will improve Outcome: Progressing   Problem: Health Behavior/Discharge Planning: Goal: Ability to identify and utilize available resources and services will improve Outcome: Progressing Goal: Ability to manage health-related needs will improve Outcome: Progressing   Problem: Metabolic: Goal: Ability to maintain appropriate glucose levels will improve Outcome: Progressing   Problem: Nutritional: Goal: Maintenance of adequate nutrition will improve Outcome: Progressing Goal: Progress toward achieving an optimal weight will improve Outcome: Progressing   Problem: Skin Integrity: Goal: Risk for impaired skin integrity will decrease Outcome: Progressing   Problem: Tissue Perfusion: Goal: Adequacy of tissue perfusion will improve Outcome: Progressing   Problem: Education: Goal: Knowledge of General Education information will improve Description: Including pain rating scale, medication(s)/side effects and non-pharmacologic comfort measures Outcome: Progressing   Problem: Health Behavior/Discharge Planning: Goal: Ability to manage health-related needs will improve Outcome: Progressing   Problem: Clinical Measurements: Goal: Ability to maintain clinical measurements within normal limits will improve Outcome: Progressing Goal: Will remain free from infection Outcome: Progressing Goal: Diagnostic test results will improve Outcome: Progressing Goal: Respiratory complications will improve Outcome: Progressing Goal: Cardiovascular complication will  be avoided Outcome: Progressing   Problem: Activity: Goal: Risk for activity intolerance will decrease Outcome: Progressing   Problem: Nutrition: Goal: Adequate nutrition will be maintained Outcome: Progressing   Problem: Coping: Goal: Level of anxiety will decrease Outcome: Progressing   Problem: Elimination: Goal: Will not experience complications related to bowel motility Outcome: Progressing Goal: Will not experience complications related to urinary retention Outcome: Progressing   Problem: Pain Managment: Goal: General experience of comfort will improve and/or be controlled Outcome: Progressing   Problem: Safety: Goal: Ability to remain free from injury will improve Outcome: Progressing   Problem: Skin Integrity: Goal: Risk for impaired skin integrity will decrease Outcome: Progressing   Problem: Fluid Volume: Goal: Hemodynamic stability will improve Outcome: Progressing   Problem: Clinical Measurements: Goal: Diagnostic test results will improve Outcome: Progressing Goal: Signs and symptoms of infection will decrease Outcome: Progressing   Problem: Respiratory: Goal: Ability to maintain adequate ventilation will improve Outcome: Progressing

## 2024-05-31 NOTE — Progress Notes (Signed)
 Occupational Therapy Treatment Patient Details Name: Jeffery Sweeney MRN: [id number] DOB: 1961/03/16 Today's Date: 05/31/2024   History of present illness presented to ER secondary to abdominal pain; admitted for management of acute gallstone pancreatitis, s/p ERCP 1/7.  Currently with cholecystostomy tube placed 12/12 during recent admission (12/10-16).  Cholecystectomy scheduled for February   OT comments  Pt is supine in bed on arrival. Pleasant and agreeable to OT session. He continues to have R flank pain at drain sites. Supervision to Min A for bed mobility for pain management. Function mobility/ADL transfer with HHA x1 vs no AD with CGA to perform standing grooming tasks with intermittent seated rest breaks on BSC, although sp02 97% and above on RA throughout session. Pt is fatigued at end of session. Pt's lunch arrive and he returned to bed with all needs in place and will cont to require skilled acute OT services to maximize his safety and IND to return to PLOF.       If plan is discharge home, recommend the following:  A little help with walking and/or transfers;A little help with bathing/dressing/bathroom   Equipment Recommendations  None recommended by OT    Recommendations for Other Services      Precautions / Restrictions Precautions Precautions: Fall Recall of Precautions/Restrictions: Impaired Precaution/Restrictions Comments: perc drain x 2 right flank Restrictions Weight Bearing Restrictions Per Provider Order: No       Mobility Bed Mobility Overal bed mobility: Needs Assistance Bed Mobility: Supine to Sit, Sit to Supine     Supine to sit: Supervision Sit to supine: Min assist   General bed mobility comments: able to reach EOB with increased time and cues for bed rail use to minimize pain, BLE management to return to supine    Transfers Overall transfer level: Needs assistance Equipment used: None Transfers: Sit to/from Stand Sit to Stand:  Supervision           General transfer comment: utilized HHA as needed vs no AD to ambulate a few steps to bathroom sink and back with CGA     Balance Overall balance assessment: Needs assistance Sitting-balance support: Feet supported Sitting balance-Leahy Scale: Good     Standing balance support: Single extremity supported Standing balance-Leahy Scale: Fair Standing balance comment: SBA with no AD use                           ADL either performed or assessed with clinical judgement   ADL Overall ADL's : Needs assistance/impaired     Grooming: Oral care;Standing;Supervision/safety;Contact guard assist Grooming Details (indicate cue type and reason): sink level intermittently sits on BSC as needed for pain/breathing                 Toilet Transfer: Contact guard Marine Scientist Details (indicate cue type and reason): simulated as he sat on BSC at sink for grooming as needed         Functional mobility during ADLs: Contact guard assist;Rolling walker (2 wheels)      Extremity/Trunk Assessment              Vision       Perception     Praxis     Communication Communication Communication: No apparent difficulties   Cognition Arousal: Alert Behavior During Therapy: [facility] for tasks assessed/performed  Following commands: Intact        Cueing   Cueing Techniques: Verbal cues  Exercises      Shoulder Instructions       General Comments sp02 97% at lowest on RA during session    Pertinent Vitals/ Pain       Pain Assessment Pain Assessment: Faces Faces Pain Scale: Hurts even more Pain Location: drain site, right flank Pain Descriptors / Indicators: Discomfort Pain Intervention(s): Monitored during session, Repositioned  Home Living                                          Prior Functioning/Environment              Frequency  Min 2X/week         Progress Toward Goals  OT Goals(current goals can now be found in the care plan section)  Progress towards OT goals: Progressing toward goals  Acute Rehab OT Goals Patient Stated Goal: go home OT Goal Formulation: With patient Time For Goal Achievement: 06/12/24 Potential to Achieve Goals: Fair  Plan      Co-evaluation                 AM-PAC OT 6 Clicks Daily Activity     Outcome Measure   Help from another person eating meals?: None Help from another person taking care of personal grooming?: A Little Help from another person toileting, which includes using toliet, bedpan, or urinal?: A Little Help from another person bathing (including washing, rinsing, drying)?: A Little Help from another person to put on and taking off regular upper body clothing?: None Help from another person to put on and taking off regular lower body clothing?: A Little 6 Click Score: 20    End of Session    OT Visit Diagnosis: Muscle weakness (generalized) (M62.81)   Activity Tolerance Patient tolerated treatment well   Patient Left in bed;with call bell/phone within reach;with bed alarm set   Nurse Communication Mobility status        Time: 8853-8786 OT Time Calculation (min): 27 min  Charges: OT General Charges $OT Visit: 1 Visit OT Treatments $Self Care/Home Management : 23-37 mins  Deitra Craine Chrismon, OTR/L  05/31/2024, 2:05 PM   Debbera Wolken E Chrismon 05/31/2024, 2:03 PM

## 2024-05-31 NOTE — Plan of Care (Signed)
" °  Problem: Coping: Goal: Ability to adjust to condition or change in health will improve Outcome: Progressing   Problem: Nutritional: Goal: Maintenance of adequate nutrition will improve Outcome: Not Progressing   Problem: Clinical Measurements: Goal: Ability to maintain clinical measurements within normal limits will improve Outcome: Progressing   Problem: Activity: Goal: Risk for activity intolerance will decrease Outcome: Progressing   Problem: Elimination: Goal: Will not experience complications related to bowel motility Outcome: Progressing   Problem: Pain Managment: Goal: General experience of comfort will improve and/or be controlled Outcome: Progressing   Problem: Safety: Goal: Ability to remain free from injury will improve Outcome: Progressing   "

## 2024-05-31 NOTE — Progress Notes (Signed)
 " PROGRESS NOTE   HPI was taken from Dr. Cleatus: Jeffery Sweeney is a 64 y.o. male with medical history significant for Prior CVA, hypertension, coronary artery disease, status post CABG and PCI and stent, , recently hospitalized (12/10-12/16/25) for acute cholecystitis, undergoing cholecystostomy on 04/29/2024, discharged home on Augmentin , being admitted with possible gallstone pancreatitis.  He presented with upper abdominal pain starting today.  Has no nausea, vomiting or diarrhea and no fever, no cough and shortness of breath. In the ED, vitals within normal limits. Labs notable for lipase 1518  AST/ALT 504/201, alk phos 130 and total bilirubin 1.7.  LFTs were essentially normal following his procedure few weeks prior.  CBC was normal. EKG showed NSR at 84 CT abdomen and pelvis with contrast showed no evidence of pancreatitis.  Showed no radiopaque stones or bile duct dilatation.  Mild gallbladder wall thickening and edema.  Cholecystostomy tube in place. MRCP ordered and pending Admission requested    Jeffery Sweeney  FMW:[id number] DOB: 1960/06/26 DOA: 05/24/2024 PCP: Towana Small, FNP  Assessment & Plan:   Principal Problem:   Acute gallstone pancreatitis Active Problems:   History of acute cholecystitis s/p percutaneous insertion of cholecystostomy tube 04/29/2024   CAD with history of CABG/Stent   Common bile duct stone   Diabetes mellitus without complication (HCC)   Essential hypertension   History of CVA (cerebrovascular accident)   Ascending cholangitis (HCC)   Bacteremia due to Klebsiella pneumoniae   Acute pancreatitis  Assessment and Plan: Acute gallstone pancreatitis: s/p percutaneous cholecystostomy 04/29/2024 for acute cholecystitis. S/p ERCP in which bile duct could not be cannulated but a small stone did come out with manipulation of the ampulla as per GI on 05/25/24. Cholecystectomy is scheduled in Feb 2026 sometime and gen surg (Dr. Desiderio) will keep  previously scheduled surg date. S/p percutaneous transhepatic cholangiogram w/ biliary drain placement on 05/26/24. Drain is no longer draining blood  Choledocholithiasis: as per MRCP. S/p ERCP in which the bile duct could not be cannulated but a small stone did come out with manipulation of the ampulla as per GI on 05/25/24. S/p percutaneous transhepatic cholangiogram w/ biliary drain placement on 05/26/24 as per IR. Drain is no longer draining blood today. H&H are trending up today.    Bacteremia: blood cxs growing klebsiella pneumoniae & aeromonas hydrophilia group. Continue on IV cefepime , flagyl  as per ID. ID following and recs apprec    Hx of CVA: continue on aspirin , statin    HTN: continue on home dose of metoprolol     DM2: well controlled, HbA1c 6.6. Continue on SSI w/ accuchecks   Bed bugs: s/p premethrin. Continue w/ contact precautions   Hx of CAD: w/ hx of CABG and stent. Holding imdur  and can likely restart tomorrow. Continue on metoprolol , aspirin , statin. Hold anti-HTN meds for MAP < 65  Obesity: BMI 31.6. Would benefit from weight loss    DVT prophylaxis: SCDs Code Status: full  Family Communication:  Disposition Plan: likely d/c back home   Level of care: Progressive Consultants:  GI  ID  Procedures:  Antimicrobials:   Subjective: Pt c/o intermittent abd pain.   Objective: Vitals:   05/30/24 2100 05/30/24 2307 05/31/24 0400 05/31/24 0759  BP: (!) 146/87  134/76 (!) 147/84  Pulse: 77  69 75  Resp:   20 20  Temp:  98 F (36.7 C) 97.9 F (36.6 C) 97.6 F (36.4 C)  TempSrc:    Oral  SpO2: 96%  97% 97%  Weight:      Height:        Intake/Output Summary (Last 24 hours) at 05/31/2024 0929 Last data filed at 05/31/2024 0356 Gross per 24 hour  Intake 240 ml  Output 630 ml  Net -390 ml   Filed Weights   05/24/24 1833  Weight: 81 kg    Examination:  General exam: appears calm & comfortable  Respiratory system: diminished breath sounds b/l   Cardiovascular system: S1 & S2+. No rubs or clicks   Gastrointestinal system: abd is soft, tenderness to palpation, obese & hypoactive bowel sounds. 2 drains in place Central nervous system: alert & oriented. Moves all extremities  Psychiatry: Judgement and insight appears at baseline. Flat mood and affect     Data Reviewed: I have personally reviewed following labs and imaging studies  CBC: Recent Labs  Lab 05/27/24 1325 05/28/24 1106 05/28/24 2027 05/29/24 0448 05/30/24 0423  WBC 4.0 9.0 2.3* 11.8* 9.8  NEUTROABS  --   --  1.8  --   --   HGB 13.1 12.4* 12.2* 11.0* 10.6*  HCT 38.0* 36.4* 36.3* 32.8* 31.9*  MCV 86.2 86.7 86.8 87.7 87.9  PLT 180 181 140* 135* 114*   Basic Metabolic Panel: Recent Labs  Lab 05/27/24 1325 05/28/24 1106 05/28/24 2027 05/29/24 0448 05/30/24 0423  NA 137 136 135 134* 137  K 3.6 3.8 3.5 3.7 3.7  CL 104 102 101 105 107  CO2 22 23 21* 19* 20*  GLUCOSE 89 122* 114* 135* 93  BUN 7* 12 16 16 14   CREATININE 0.73 0.90 1.14 0.92 0.69  CALCIUM  9.0 8.7* 8.6* 8.3* 7.8*  MG  --   --  1.5*  --   --    GFR: Estimated Creatinine Clearance: 87.7 mL/min (by C-G formula based on SCr of 0.69 mg/dL). Liver Function Tests: Recent Labs  Lab 05/26/24 0508 05/27/24 1325 05/28/24 1106 05/28/24 2027 05/29/24 0448  AST 85* 67* 25 26 37  ALT 122* 88* 56* 50* 44  ALKPHOS 120 149* 120 152* 98  BILITOT 1.6* 1.4* 1.0 1.4* 1.2  PROT 6.4* 7.1 7.2 7.1 6.4*  ALBUMIN  3.2* 3.5 3.4* 3.4* 3.0*   Recent Labs  Lab 05/24/24 1841 05/25/24 0426  LIPASE 1,518* 161*   No results for input(s): AMMONIA in the last 168 hours. Coagulation Profile: Recent Labs  Lab 05/26/24 1557 05/28/24 2027  INR 1.1 1.2   Cardiac Enzymes: No results for input(s): CKTOTAL, CKMB, CKMBINDEX, TROPONINI in the last 168 hours. BNP (last 3 results) No results for input(s): PROBNP in the last 8760 hours. HbA1C: No results for input(s): HGBA1C in the last 72  hours. CBG: Recent Labs  Lab 05/30/24 1755 05/30/24 2123 05/30/24 2309 05/31/24 0355 05/31/24 0757  GLUCAP 88 92 96 91 73   Lipid Profile: Recent Labs    05/31/24 0401  CHOL 51  HDL 14*  LDLCALC 15  TRIG 886  CHOLHDL 3.7   Thyroid  Function Tests: No results for input(s): TSH, T4TOTAL, FREET4, T3FREE, THYROIDAB in the last 72 hours. Anemia Panel: No results for input(s): VITAMINB12, FOLATE, FERRITIN, TIBC, IRON, RETICCTPCT in the last 72 hours. Sepsis Labs: Recent Labs  Lab 05/28/24 2027 05/28/24 2222  LATICACIDVEN 2.6* 1.8    Recent Results (from the past 240 hours)  Culture, blood (Routine X 2) w Reflex to ID Panel     Status: None (Preliminary result)   Collection Time: 05/27/24  1:24 PM   Specimen: BLOOD  Result Value Ref Range Status  Specimen Description BLOOD BLOOD RIGHT ARM  Final   Special Requests   Final    BOTTLES DRAWN AEROBIC AND ANAEROBIC Blood Culture adequate volume   Culture   Final    NO GROWTH 4 DAYS Performed at [facility], [address], [place], [place] [address]    Report Status PENDING  Incomplete  Culture, blood (Routine X 2) w Reflex to ID Panel     Status: None (Preliminary result)   Collection Time: 05/27/24  1:24 PM   Specimen: BLOOD  Result Value Ref Range Status   Specimen Description BLOOD BLOOD RIGHT HAND  Final   Special Requests   Final    BOTTLES DRAWN AEROBIC AND ANAEROBIC Blood Culture adequate volume   Culture   Final    NO GROWTH 4 DAYS Performed at [facility] [place], [address] [place], [place] [address]    Report Status PENDING  Incomplete  Culture, blood (Routine X 2) w Reflex to ID Panel     Status: Abnormal (Preliminary result)   Collection Time: 05/28/24  8:58 PM   Specimen: BLOOD  Result Value Ref Range Status   Specimen Description   Final    BLOOD BLOOD RIGHT ARM Performed at [facility], [address], [place], [place] [address]    Special  Requests   Final    BOTTLES DRAWN AEROBIC AND ANAEROBIC Blood Culture adequate volume Performed at [facility], [address] [place], [place] [address]    Culture  Setup Time   Final    GRAM NEGATIVE RODS IN BOTH AEROBIC AND ANAEROBIC BOTTLES CRITICAL RESULT CALLED TO, READ BACK BY AND VERIFIED WITH: CATHALEEN BLANCH 05/29/24 0841 MW Performed at [facility], [address] [place], [place] [address]    Culture (A)  Final    KLEBSIELLA PNEUMONIAE SUSCEPTIBILITIES PERFORMED ON PREVIOUS CULTURE WITHIN THE LAST 5 DAYS. AEROMONAS HYDROPHILA GROUP Sent to [facility] for further susceptibility testing. Performed at [facility], [address] [place], [place] [address]    Report Status PENDING  Incomplete  Culture, blood (Routine X 2) w Reflex to ID Panel     Status: Abnormal (Preliminary result)   Collection Time: 05/28/24  9:00 PM   Specimen: BLOOD  Result Value Ref Range Status   Specimen Description   Final    BLOOD BLOOD RIGHT HAND Performed at [facility], [address], [place], [place] [address]    Special Requests   Final    BOTTLES DRAWN AEROBIC AND ANAEROBIC Blood Culture adequate volume Performed at [facility], [address] [place], [place] [address]    Culture  Setup Time   Final    Organism ID to follow GRAM NEGATIVE RODS IN BOTH AEROBIC AND ANAEROBIC BOTTLES CRITICAL RESULT CALLED TO, READ BACK BY AND VERIFIED WITH: CATHALEEN PATEL 05/29/24 0841 MW CRITICAL RESULT CALLED TO, READ BACK BY AND VERIFIED WITH: RN AMBER PAYNE [id number] AT 1248 BY EC Performed at [facility], [address], [place], [place] [address]    Culture KLEBSIELLA PNEUMONIAE (A)  Final   Report Status PENDING  Incomplete   Organism ID, Bacteria KLEBSIELLA PNEUMONIAE  Final      Susceptibility   Klebsiella pneumoniae - MIC*    AMPICILLIN >=32 RESISTANT Resistant     CEFAZOLIN (NON-URINE) 4 INTERMEDIATE Intermediate     CEFEPIME  <=0.12  SENSITIVE Sensitive     ERTAPENEM <=0.12 SENSITIVE Sensitive     CEFTRIAXONE  <=0.25  SENSITIVE Sensitive     CIPROFLOXACIN  <=0.06 SENSITIVE Sensitive     GENTAMICIN <=1 SENSITIVE Sensitive     MEROPENEM <=0.25 SENSITIVE Sensitive     TRIMETH/SULFA <=20 SENSITIVE Sensitive     AMPICILLIN/SULBACTAM 16 INTERMEDIATE Intermediate     PIP/TAZO Value in next row Sensitive      <=4 SENSITIVEThis is a modified FDA-approved test that has been validated and its performance characteristics determined by the reporting laboratory.  This laboratory is certified under the Clinical Laboratory Improvement Amendments CLIA as qualified to perform high complexity clinical laboratory testing.    * KLEBSIELLA PNEUMONIAE  Blood Culture ID Panel (Reflexed)     Status: Abnormal   Collection Time: 05/28/24  9:00 PM  Result Value Ref Range Status   Enterococcus faecalis NOT DETECTED NOT DETECTED Final    Comment: Performed at [facility], [address], [place], [place] [address]   Enterococcus Faecium NOT DETECTED NOT DETECTED Final    Comment: Performed at [facility], [address] [place], [place] [address]   Listeria monocytogenes NOT DETECTED NOT DETECTED Final    Comment: Performed at [facility], [address] [place], [place] [address]   Staphylococcus species NOT DETECTED NOT DETECTED Final    Comment: Performed at [facility], [address] [place], [place] [address]   Staphylococcus aureus (BCID) NOT DETECTED NOT DETECTED Final    Comment: Performed at [facility], [address] [place], [place] [address]   Staphylococcus epidermidis NOT DETECTED NOT DETECTED Final    Comment: Performed at [facility], [address], [place], [place] [address]   Staphylococcus lugdunensis NOT DETECTED NOT DETECTED Final    Comment: Performed at [facility], [address] [place], [place] [address]   Streptococcus species NOT DETECTED NOT DETECTED Final     Comment: Performed at [facility], [address], [place], [place] [address]   Streptococcus agalactiae NOT DETECTED NOT DETECTED Final    Comment: Performed at [facility], [address] [place], [place] [address]   Streptococcus pneumoniae NOT DETECTED NOT DETECTED Final    Comment: Performed at [facility], [address] [place], [place] [address]   Streptococcus pyogenes NOT DETECTED NOT DETECTED Final    Comment: Performed at [facility], [address] [place], [place] [address]   A.calcoaceticus-baumannii NOT DETECTED NOT DETECTED Final    Comment: Performed at [facility], [address] [place], [place] [address]   Bacteroides fragilis NOT DETECTED NOT DETECTED Final    Comment: Performed at [facility], [address] [place], [place] [address]   Enterobacterales DETECTED (A) NOT DETECTED Corrected    Comment: Enterobacterales represent a large order of gram negative bacteria, not a single organism. CRITICAL RESULT CALLED TO, READ BACK BY AND VERIFIED WITH: MADISON HUNT PHARMD 1300 05/30/24 HNM Performed at [facility], [address] [place], [place] [address] CORRECTED ON 01/12 AT 1310: PREVIOUSLY REPORTED AS DETECTED Enterobacterales represent a large order of gram negative bacteria, not a single organism. CRITICAL RESULT CALLED TO, READ BACK BY AND VERIFIED WITH: PHARMD AMBER PAYNE [id number] AT 1248 BY EC    Enterobacter cloacae complex NOT DETECTED NOT DETECTED Final    Comment: Performed at [facility], [address], [place], [place] [address]   Escherichia coli NOT DETECTED NOT DETECTED Final  Comment: Performed at [facility], [address], [place], [place] [address]   Klebsiella aerogenes NOT DETECTED NOT DETECTED Final    Comment: Performed at [facility], [address], [place], [place] [address]   Klebsiella oxytoca NOT DETECTED NOT DETECTED Final    Comment: Performed at [facility], [address], [place], [place] [address]   Klebsiella pneumoniae DETECTED (A) NOT DETECTED Corrected    Comment: CRITICAL RESULT CALLED TO, READ BACK BY AND VERIFIED WITH: MADISON HUNT PHARMD 1300 05/30/24 HNM Performed at [facility], [address] [place], [place] [address] CORRECTED ON 01/12 AT 1310: PREVIOUSLY REPORTED AS DETECTED CRITICAL RESULT CALLED TO, READ BACK BY AND VERIFIED WITH: PHARMD AMBER PAYNE [id number] AT 1248 BY EC    Proteus species NOT DETECTED NOT DETECTED Final   Salmonella species NOT DETECTED NOT DETECTED Final   Serratia marcescens NOT DETECTED NOT DETECTED Final   Haemophilus influenzae NOT DETECTED NOT DETECTED Final   Neisseria meningitidis NOT DETECTED NOT DETECTED Final   Pseudomonas aeruginosa NOT DETECTED NOT DETECTED Final   Stenotrophomonas maltophilia NOT DETECTED NOT DETECTED Final   Candida albicans NOT DETECTED NOT DETECTED Final   Candida auris NOT DETECTED NOT DETECTED Final   Candida glabrata NOT DETECTED NOT DETECTED Final   Candida krusei NOT DETECTED NOT DETECTED Final   Candida parapsilosis NOT DETECTED NOT DETECTED Final   Candida tropicalis NOT DETECTED NOT DETECTED Final   Cryptococcus neoformans/gattii NOT DETECTED NOT DETECTED Final   CTX-M ESBL NOT DETECTED NOT DETECTED Final   Carbapenem resistance IMP NOT DETECTED NOT DETECTED Final   Carbapenem resistance KPC NOT DETECTED NOT DETECTED Final   Carbapenem resistance NDM NOT DETECTED NOT DETECTED Final   Carbapenem resist OXA 48 LIKE NOT DETECTED NOT DETECTED Final   Carbapenem resistance VIM NOT DETECTED NOT DETECTED Final    Comment: Performed at [facility], [address], [place], [place] [address]         Radiology Studies: No results found.       Scheduled Meds:  aspirin   81 mg Oral Daily   feeding supplement  237 mL Oral BID BM   insulin  aspart  0-9 Units Subcutaneous Q4H   metoprolol  tartrate  50 mg Oral BID   pantoprazole   40 mg  Oral Daily   polyethylene glycol  17 g Oral Daily   rosuvastatin   40 mg Oral Daily   Continuous Infusions:  ceFEPime  (MAXIPIME ) IV 2 g (05/31/24 0216)   metronidazole  500 mg (05/31/24 0030)      LOS: 5 days      Anthony CHRISTELLA Pouch, MD [facility] Pager [phone]  If 7PM-7AM, please contact night-coverage [email or web address] 05/31/2024, 9:29 AM   "

## 2024-05-31 NOTE — Progress Notes (Signed)
 "   Referring Provider(s): Dr. Rogelia Copping, MD.  Supervising Physician: Jenna Hacker  Patient Status:  Jeffery [facility] - In-pt  Chief Complaint: Calculus of bile duct without cholangitis s/p perc chole tube on 12/12 and PTC/ biliary drain on 1/9.  Brief History:  Patient presented to ED on 1/6 with complaints of worsening upper abdominal pain. Workup revealed elevated lipase with transaminitis, and patient was admitted for management of gallstone pancreatitis. MRCP on 1/7 demonstrated minimal dilatation of the CBD with abnormality in the distal CBD most consistent with a small distal CBD stone. GI was consulted and patient underwent ERCP with Dr. Copping. Unfortunately, the bile duct was unable to be cannulated, but a small stone was noted to pass out of the ampulla. IR was consulted and placed a PTC/ biliary drain in anticipation of hopeful stent placement with Dr. Copping. Since placement, the biliary drain output has been blood-laden, with a concurrent downtrend in H&H from 13.1 on 1/9 to 10.6 on 1/12.   Subjective:  Patient is asleep on rounds this am, and only briefly awakes. He denies any significant complaints. He is tolerating his drain with some associated tenderness since placement.   Allergies: Patient has no known allergies.  Medications: Prior to Admission medications  Medication Sig Start Date End Date Taking? Authorizing Provider  acetaminophen  (TYLENOL ) 500 MG tablet Take 2 tablets (1,000 mg total) by mouth every 6 (six) hours as needed. 10/15/23 10/14/24 Yes Clarine Ozell LABOR, MD  aspirin  81 MG chewable tablet Chew 1 tablet (81 mg total) by mouth daily. 02/23/24  Yes Towana Small, FNP  clopidogrel  (PLAVIX ) 75 MG tablet Take 1 tablet (75 mg total) by mouth daily. 02/15/24  Yes Darron Deatrice LABOR, MD  isosorbide  mononitrate (IMDUR ) 30 MG 24 hr tablet Take 2 tablets (60 mg total) by mouth 2 (two) times daily. 02/23/24 05/25/24 Yes Towana Small, FNP  meclizine  (ANTIVERT ) 25 MG tablet Take  2 tablets (50 mg total) by mouth 2 (two) times daily as needed for dizziness. 02/23/24  Yes Towana Small, FNP  metFORMIN  (GLUCOPHAGE -XR) 500 MG 24 hr tablet Take 2 tablets (1,000 mg total) by mouth 2 (two) times daily with a meal. 10/20/23  Yes Towana Small, FNP  metoprolol  tartrate (LOPRESSOR ) 50 MG tablet Take 1 tablet (50 mg total) by mouth 2 (two) times daily. 02/23/24  Yes Towana Small, FNP  nitroGLYCERIN  (NITROSTAT ) 0.4 MG SL tablet Place 1 tablet (0.4 mg total) under the tongue every 5 (five) minutes as needed for chest pain. 09/26/21  Yes Hammock, Sheri, NP  pantoprazole  (PROTONIX ) 20 MG tablet Take 2 tablets (40 mg total) by mouth daily. 02/23/24  Yes Butler, Kristina, FNP  rosuvastatin  (CRESTOR ) 40 MG tablet Take 1 tablet by mouth once daily 02/23/24  Yes Butler, Kristina, FNP  traZODone  (DESYREL ) 50 MG tablet Take 0.5-1 tablets (25-50 mg total) by mouth at bedtime as needed for sleep. 02/23/24  Yes Towana Small, FNP  albuterol  (VENTOLIN  HFA) 108 (90 Base) MCG/ACT inhaler Inhale 2 puffs into the lungs every 6 (six) hours as needed for wheezing or shortness of breath. Patient not taking: Reported on 05/25/2024 06/16/23   Towana Small, FNP  sodium chloride  flush 0.9 % SOLN injection 5 mLs by Intracatheter route daily. 05/02/24 07/01/24  Desiderio Schanz, MD  sodium chloride  flush 0.9 % SOLN injection 5 mLs by Intracatheter route daily. Flush cholecystostomy tube daily with 5 ccs NS 05/02/24 07/01/24  Terryl Arthea SAUNDERS, PA-C     Vital Signs: BP 134/82 (BP Location: Left  Arm)   Pulse 72   Temp (!) 97.5 F (36.4 C) (Oral)   Resp 20   Ht 5' 3 (1.6 m)   Wt 178 lb 9.2 oz (81 kg)   SpO2 96%   BMI 31.63 kg/m   Physical Exam Constitutional:      Appearance: Normal appearance.  Cardiovascular:     Rate and Rhythm: Normal rate.  Pulmonary:     Effort: Pulmonary effort is normal.  Abdominal:     General: Abdomen is flat. There is no distension.     Tenderness: There is abdominal  tenderness in the right upper quadrant.     Comments: 10 Fr percutaneous cholecystostomy drain: Appropriately dressed. Dressing is clean, dry, intact. Drain incision site minimally tender, without evidence of infection.  Retaining suture in place.  ~5-10 mL of tan/bilious output in collection bag.  Line flushes well.   8 Fr biliary drain appropriately: Appropriately dressed. Dressing is clean, dry, intact. Drain incision site minimally tender, without evidence of infection.  Retaining suture and Stat Lock in place.  ~30 mL of bilious output in collection bag.  Line flushes well.   Musculoskeletal:        General: Normal range of motion.  Skin:    General: Skin is warm and dry.  Neurological:     Mental Status: He is alert and oriented to person, place, and time.  Psychiatric:        Mood and Affect: Mood normal.        Behavior: Behavior normal.      Labs:  CBC: Recent Labs    05/28/24 2027 05/29/24 0448 05/30/24 0423 05/31/24 1015  WBC 2.3* 11.8* 9.8 8.7  HGB 12.2* 11.0* 10.6* 12.9*  HCT 36.3* 32.8* 31.9* 38.1*  PLT 140* 135* 114* 145*    COAGS: Recent Labs    04/28/24 1656 05/26/24 1557 05/28/24 2027  INR 1.1 1.1 1.2  APTT  --   --  34    BMP: Recent Labs    05/28/24 2027 05/29/24 0448 05/30/24 0423 05/31/24 1015  NA 135 134* 137 135  K 3.5 3.7 3.7 3.7  CL 101 105 107 102  CO2 21* 19* 20* 22  GLUCOSE 114* 135* 93 106*  BUN 16 16 14 11   CALCIUM  8.6* 8.3* 7.8* 8.3*  CREATININE 1.14 0.92 0.69 0.72  GFRNONAA >60 >60 >60 >60    LIVER FUNCTION TESTS: Recent Labs    05/27/24 1325 05/28/24 1106 05/28/24 2027 05/29/24 0448  BILITOT 1.4* 1.0 1.4* 1.2  AST 67* 25 26 37  ALT 88* 56* 50* 44  ALKPHOS 149* 120 152* 98  PROT 7.1 7.2 7.1 6.4*  ALBUMIN  3.5 3.4* 3.4* 3.0*    Assessment and Plan:  Drain Location: RUQ Size: Cholecystostomy drain is 10 Fr; PTC biliary drain is 8 Fr. Date of placement: Cholecystostomy drain: 12/12; PTC biliary  drain: 1/9. Currently to: both drains each connected to a gravity collection bag. 24 hour output:  Output by Drain (mL) 05/29/24 0701 - 05/29/24 1900 05/29/24 1901 - 05/30/24 0700 05/30/24 0701 - 05/30/24 1900 05/30/24 1901 - 05/31/24 0700 05/31/24 0701 - 05/31/24 1214  Biliary Tube VTCB biliary 8 Fr. RUQ  350  155 125  Biliary Tube Cholecystostomy 10 Fr. RUQ  10  25 30     Interval imaging/drain manipulation:  None.  Current examination: Bilious output in both drain collection bag without evident blood in drainage. The cholecystostomy drain appears to include tan output, which may represent old  blood mixed with bile.  Both drains flushes easily.  Insertion site unremarkable. Suture and stat lock in place. Dressed appropriately.   Plan: Patient tolerating drains. Drain output over the weekend was concerning for bloody output with associated mild downtrend in H&H (Hgb 10.6 on 1/12, down from 13.1 on 1/9). Bloody output from drains changed to bilious in the evening on 1/12. Patient's Hgb rebounded to 12.9 this am.  Per Care team, no further concerns with regards to drains at this time. Dr. Jinny plans on stent placement in 4-6 weeks. Patient to keep both drains until then.  Continue TID flushes with 5 cc NS. Record output Q shift. Dressing changes QD or PRN if soiled.  Call IR APP or on call IR MD if difficulty flushing or sudden change in drain output.  Repeat imaging/possible drain injection once output < 10 mL/QD (excluding flush material). Consideration for drain removal if output is < 10 mL/QD (excluding flush material), pending discussion with the providing surgical service.  Discharge planning: Please contact IR APP or on call IR MD prior to patient d/c to ensure appropriate follow up plans are in place. Typically patient will follow up with IR clinic 10-14 days post d/c for repeat imaging/possible drain injection. IR scheduler will contact patient with date/time of appointment.  Patient will need to flush drain QD with 5 cc NS, record output QD, dressing changes every 2-3 days or earlier if soiled.   IR will continue to follow - please call with questions or concerns.    Thank you for this interesting consult.  I greatly enjoyed meeting Jeffery Sweeney and look forward to participating in their care.   Electronically Signed: Carlin DELENA Griffon, PA-C 05/31/2024, 12:13 PM     I spent a total of 25 Minutes at the the patient's bedside AND on the patient's [facility] floor or unit, greater than 50% of which was counseling/coordinating care for percutaneous cholecystostomy and PTC biliary drain care and follow-up.  "

## 2024-05-31 NOTE — Progress Notes (Signed)
 "  Date of Admission:  05/24/2024      ID: Jeffery Sweeney is a 64 y.o. male Principal Problem:   Acute gallstone pancreatitis Active Problems:   CAD with history of CABG/Stent   Common bile duct stone   Diabetes mellitus without complication (HCC)   Essential hypertension   History of CVA (cerebrovascular accident)   History of acute cholecystitis s/p percutaneous insertion of cholecystostomy tube 04/29/2024   Ascending cholangitis (HCC)   Bacteremia due to Klebsiella pneumoniae   Acute pancreatitis  Jeffery Sweeney is a 64 y.o. with a history of CVA, HTN, CAD S/p CABG, Ptent was recently in the hospital for acute cholecystitis between 12/10-12/16 and underwent cholecystostomy on 12/12 and bile culture was klebsiella pneumoniae discharged  home on Augmentin  presented to the ED on 1/6 with upper abdominal pain of 1 day duration   Doing okay Pain abdomen has improved  Medications:   aspirin   81 mg Oral Daily   feeding supplement  237 mL Oral BID BM   insulin  aspart  0-9 Units Subcutaneous Q4H   metoprolol  tartrate  50 mg Oral BID   pantoprazole   40 mg Oral Daily   polyethylene glycol  17 g Oral Daily   rosuvastatin   40 mg Oral Daily    Objective: Vital signs in last 24 hours: Patient Vitals for the past 24 hrs:  BP Temp Temp src Pulse Resp SpO2  05/31/24 2054 (!) 145/83 (!) 97.5 F (36.4 C) -- 77 20 94 %  05/31/24 2053 (!) 145/83 (!) 97.5 F (36.4 C) -- 80 20 95 %  05/31/24 1538 (!) 147/91 97.6 F (36.4 C) Oral 70 20 95 %  05/31/24 1154 134/82 (!) 97.5 F (36.4 C) Oral 72 20 96 %  05/31/24 0759 (!) 147/84 97.6 F (36.4 C) Oral 75 20 97 %  05/31/24 0400 134/76 97.9 F (36.6 C) -- 69 20 97 %  05/30/24 2307 -- 98 F (36.7 C) -- -- -- --      PHYSICAL EXAM:  General: Alert, cooperative, no distress, appears stated age.  Lungs: Clear to auscultation bilaterally. No Wheezing or Rhonchi. No rales. Heart: Regular rate and rhythm, no murmur, rub or  gallop. Abdomen: Soft, minimal tenderness Rt upper quadrant 2 drains . Bowel sounds normal. No masses Extremities: atraumatic, no cyanosis. No edema. No clubbing Skin: No rashes or lesions. Or bruising Lymph: Cervical, supraclavicular normal. Neurologic: Grossly non-focal  Lab Results    Latest Ref Rng & Units 05/31/2024   10:15 AM 05/30/2024    4:23 AM 05/29/2024    4:48 AM  CBC  WBC 4.0 - 10.5 K/uL 8.7  9.8  11.8   Hemoglobin 13.0 - 17.0 g/dL 87.0  89.3  88.9   Hematocrit 39.0 - 52.0 % 38.1  31.9  32.8   Platelets 150 - 400 K/uL 145  114  135        Latest Ref Rng & Units 05/31/2024   10:15 AM 05/30/2024    4:23 AM 05/29/2024    4:48 AM  CMP  Glucose 70 - 99 mg/dL 893  93  864   BUN 8 - 23 mg/dL 11  14  16    Creatinine 0.61 - 1.24 mg/dL 9.27  9.30  9.07   Sodium 135 - 145 mmol/L 135  137  134   Potassium 3.5 - 5.1 mmol/L 3.7  3.7  3.7   Chloride 98 - 111 mmol/L 102  107  105   CO2 22 -  32 mmol/L 22  20  19    Calcium  8.9 - 10.3 mg/dL 8.3  7.8  8.3   Total Protein 6.5 - 8.1 g/dL   6.4   Total Bilirubin 0.0 - 1.2 mg/dL   1.2   Alkaline Phos 38 - 126 U/L   98   AST 15 - 41 U/L   37   ALT 0 - 44 U/L   44       Microbiology: BC  klebsiella and aeromonas    Assessment/Plan: Ascending cholangitis due to CBD stone  Klebsiella pneumoniae bacteremia due to the above S/p IR placing biliary drain  Aeromonas bacteremia- can be true pathogen Continue Cefepime  and flagyl  May be able to de-escalate soon  Abnormal LFTS secondary to the above improved      Acute pancreatitis secondary to gall stone/CBD stone   Recent acute cholecystitis s/p Perc cholecystostomy     Discussed the management with the patient and ID pharmacist  "

## 2024-05-31 NOTE — Progress Notes (Signed)
 Patient CBG 73, RN informed patient his glucose levels are low, patient offered snack. Agreeable to graham crackers and coffee with sugar.

## 2024-06-01 ENCOUNTER — Telehealth: Payer: Self-pay | Admitting: *Deleted

## 2024-06-01 LAB — CULTURE, BLOOD (ROUTINE X 2)
Culture: NO GROWTH
Culture: NO GROWTH
Special Requests: ADEQUATE
Special Requests: ADEQUATE

## 2024-06-01 LAB — GLUCOSE, CAPILLARY
Glucose-Capillary: 94 mg/dL (ref 70–99)
Glucose-Capillary: 107 mg/dL — ABNORMAL HIGH (ref 70–99)
Glucose-Capillary: 99 mg/dL (ref 70–99)
Glucose-Capillary: 100 mg/dL — ABNORMAL HIGH (ref 70–99)
Glucose-Capillary: 102 mg/dL — ABNORMAL HIGH (ref 70–99)

## 2024-06-01 LAB — CBC
HCT: 35.6 % — ABNORMAL LOW (ref 39.0–52.0)
Hemoglobin: 12.1 g/dL — ABNORMAL LOW (ref 13.0–17.0)
MCH: 29.3 pg (ref 26.0–34.0)
MCHC: 34 g/dL (ref 30.0–36.0)
MCV: 86.2 fL (ref 80.0–100.0)
Platelets: 155 K/uL (ref 150–400)
RBC: 4.13 MIL/uL — ABNORMAL LOW (ref 4.22–5.81)
RDW: 12.9 % (ref 11.5–15.5)
WBC: 9.2 K/uL (ref 4.0–10.5)
nRBC: 0 % (ref 0.0–0.2)

## 2024-06-01 NOTE — Progress Notes (Signed)
 Physical Therapy Treatment Patient Details Name: Deshaun Weisinger MRN: [id number] DOB: 10/04/1960 Today's Date: 06/01/2024   History of Present Illness presented to ER secondary to abdominal pain; admitted for management of acute gallstone pancreatitis, s/p ERCP 1/7.  Currently with cholecystostomy tube placed 12/12 during recent admission (12/10-16).  Cholecystectomy scheduled for February    PT Comments  Patient reports he has been up twice today already and declined getting out of bed. He was agreeable to in bed exercises. Reviewed several therapeutic exercises and encouraged patient to perform as able for strengthening to maximize independence with mobility. Vitals stable and no reported increased pain with exercise. PT will continue to follow.    If plan is discharge home, recommend the following: A little help with walking and/or transfers;A little help with bathing/dressing/bathroom;Assist for transportation;Help with stairs or ramp for entrance;Assistance with cooking/housework   Can travel by private vehicle     Yes  Equipment Recommendations  Rolling walker (2 wheels)    Recommendations for Other Services       Precautions / Restrictions Precautions Precautions: Fall Recall of Precautions/Restrictions: Impaired Precaution/Restrictions Comments: perc drain x 2 right flank Restrictions Weight Bearing Restrictions Per Provider Order: No     Mobility  Bed Mobility               General bed mobility comments: patient declined mobility and reports having been up twice today already. encouraged LE exercises for strengthening to maximize independence with functional independence    Transfers                        Ambulation/Gait                   Stairs             Wheelchair Mobility     Tilt Bed    Modified Rankin (Stroke Patients Only)       Balance                                             Communication Communication Communication: No apparent difficulties  Cognition Arousal: Alert Behavior During Therapy: WFL for tasks assessed/performed   PT - Cognitive impairments: No apparent impairments                         Following commands: Intact      Cueing Cueing Techniques: Verbal cues  Exercises General Exercises - Lower Extremity Ankle Circles/Pumps: AROM, Strengthening, Both, 10 reps, Supine Heel Slides: AAROM, Strengthening, Both, 10 reps, Supine Hip ABduction/ADduction: AAROM, Strengthening, Both, 10 reps, Supine Straight Leg Raises: AAROM, Right, Both, 10 reps Other Exercises Other Exercises: reviewed exercise technique for strengthening. encouraged LE exercises to perform on his own if able. no significant change in vitals are noted with activity. no pain reported at the drain site with exercise    General Comments General comments (skin integrity, edema, etc.): discussed routine mobility recommendations with mobility tech today      Pertinent Vitals/Pain Pain Assessment Pain Assessment: Faces Faces Pain Scale: Hurts a little bit Pain Location: upper back/right shoulder Pain Descriptors / Indicators: Discomfort Pain Intervention(s): Limited activity within patient's tolerance, Monitored during session, Repositioned    Home Living  Prior Function            PT Goals (current goals can now be found in the care plan section) Acute Rehab PT Goals Patient Stated Goal: to get better and go home Progress towards PT goals: Progressing toward goals    Frequency    Min 2X/week      PT Plan      Co-evaluation              AM-PAC PT 6 Clicks Mobility   Outcome Measure  Help needed turning from your back to your side while in a flat bed without using bedrails?: A Little Help needed moving from lying on your back to sitting on the side of a flat bed without using bedrails?: A Little Help needed  moving to and from a bed to a chair (including a wheelchair)?: A Little Help needed standing up from a chair using your arms (e.g., wheelchair or bedside chair)?: A Little Help needed to walk in hospital room?: A Little Help needed climbing 3-5 steps with a railing? : A Little 6 Click Score: 18    End of Session   Activity Tolerance: Patient tolerated treatment well Patient left: in bed;with call bell/phone within reach;with bed alarm set   PT Visit Diagnosis: Muscle weakness (generalized) (M62.81)     Time: 1120-1130 PT Time Calculation (min) (ACUTE ONLY): 10 min  Charges:    $Therapeutic Exercise: 8-22 mins PT General Charges $$ ACUTE PT VISIT: 1 Visit                     Randine Essex, PT, MPT    Randine LULLA Essex 06/01/2024, 11:41 AM

## 2024-06-01 NOTE — Progress Notes (Signed)
 "  Date of Admission:  05/24/2024      ID: Jeffery Sweeney is a 64 y.o. male Principal Problem:   Acute gallstone pancreatitis Active Problems:   CAD with history of CABG/Stent   Common bile duct stone   Diabetes mellitus without complication (HCC)   Essential hypertension   History of CVA (cerebrovascular accident)   History of acute cholecystitis s/p percutaneous insertion of cholecystostomy tube 04/29/2024   Ascending cholangitis (HCC)   Bacteremia due to Klebsiella pneumoniae   Acute pancreatitis  Jeffery Sweeney is a 64 y.o. with a history of CVA, HTN, CAD S/p CABG, Ptent was recently in the hospital for acute cholecystitis between 12/10-12/16 and underwent cholecystostomy on 12/12 and bile culture was klebsiella pneumoniae discharged  home on Augmentin  presented to the ED on 1/6 with upper abdominal pain of 1 day duration    Doing much better  Medications:   aspirin   81 mg Oral Daily   feeding supplement  237 mL Oral BID BM   insulin  aspart  0-9 Units Subcutaneous Q4H   metoprolol  tartrate  50 mg Oral BID   pantoprazole   40 mg Oral Daily   polyethylene glycol  17 g Oral Daily   rosuvastatin   40 mg Oral Daily    Objective: Vital signs in last 24 hours: Patient Vitals for the past 24 hrs:  BP Temp Temp src Pulse Resp SpO2  06/01/24 0900 -- 98 F (36.7 C) -- 89 -- 97 %  06/01/24 0812 (!) 146/96 (!) 97.2 F (36.2 C) -- 80 16 96 %  06/01/24 0455 -- 98.2 F (36.8 C) -- -- -- --  06/01/24 0104 133/79 98.9 F (37.2 C) Axillary 72 20 92 %  05/31/24 2054 (!) 145/83 (!) 97.5 F (36.4 C) Oral 77 20 94 %  05/31/24 2053 (!) 145/83 (!) 97.5 F (36.4 C) -- 80 20 95 %  05/31/24 1538 (!) 147/91 97.6 F (36.4 C) Oral 70 20 95 %  05/31/24 1154 134/82 (!) 97.5 F (36.4 C) Oral 72 20 96 %      PHYSICAL EXAM:  General: Alert, cooperative, no distress, appears stated age.  Lungs: Clear to auscultation bilaterally. No Wheezing or Rhonchi. No rales. Heart: Regular rate  and rhythm, no murmur, rub or gallop. Abdomen: Soft, minimal tenderness Rt upper quadrant 2 drains . Bowel sounds normal. No masses Extremities: atraumatic, no cyanosis. No edema. No clubbing Skin: No rashes or lesions. Or bruising Lymph: Cervical, supraclavicular normal. Neurologic: Grossly non-focal  Lab Results    Latest Ref Rng & Units 06/01/2024    4:12 AM 05/31/2024   10:15 AM 05/30/2024    4:23 AM  CBC  WBC 4.0 - 10.5 K/uL 9.2  8.7  9.8   Hemoglobin 13.0 - 17.0 g/dL 87.8  87.0  89.3   Hematocrit 39.0 - 52.0 % 35.6  38.1  31.9   Platelets 150 - 400 K/uL 155  145  114        Latest Ref Rng & Units 05/31/2024   10:15 AM 05/30/2024    4:23 AM 05/29/2024    4:48 AM  CMP  Glucose 70 - 99 mg/dL 893  93  864   BUN 8 - 23 mg/dL 11  14  16    Creatinine 0.61 - 1.24 mg/dL 9.27  9.30  9.07   Sodium 135 - 145 mmol/L 135  137  134   Potassium 3.5 - 5.1 mmol/L 3.7  3.7  3.7   Chloride 98 -  111 mmol/L 102  107  105   CO2 22 - 32 mmol/L 22  20  19    Calcium  8.9 - 10.3 mg/dL 8.3  7.8  8.3   Total Protein 6.5 - 8.1 g/dL   6.4   Total Bilirubin 0.0 - 1.2 mg/dL   1.2   Alkaline Phos 38 - 126 U/L   98   AST 15 - 41 U/L   37   ALT 0 - 44 U/L   44       Microbiology: [facility]  05/28/24  klebsiella and aeromonas both sets  06/01/24 [facility] sent    Assessment/Plan: Ascending cholangitis due to CBD stone  Klebsiella pneumoniae bacteremia due to the above S/p IR placing biliary drain  Aeromonas bacteremia- can be true pathogen On Cefepime   Will need total of 14 days of antibiotic- 06/10/24 We can do quinolone if no CI- will get EKG to look for QT    Abnormal LFTS secondary to the above improved      Acute pancreatitis secondary to gall stone/CBD stone   Recent acute cholecystitis s/p Perc cholecystostomy     Discussed the management with the patient and ID pharmacist  "

## 2024-06-01 NOTE — Progress Notes (Addendum)
 "    Progress Note    Jeffery Sweeney  FMW:[id number] DOB: 1960-09-05  DOA: 05/24/2024 PCP: Towana Small, FNP      Brief Narrative:    Medical records reviewed and are as summarized below:  Jeffery Sweeney is a 64 y.o. male with medical history significant for Prior CVA, hypertension, coronary artery disease, status post CABG and PCI and stent, , recently hospitalized (12/10-12/16/25) for acute cholecystitis, undergoing cholecystostomy on 04/29/2024, discharged home on Augmentin . He presented to the ED again on 05/24/2024 with upper abdominal pain.  Labs significant for lipase 1,518, AST 504, ALT 201, ALP 130, bilirubin 1.7   CT abdomen and pelvis IMPRESSION: 1. No CT evidence of acute pancreatitis. 2. Mild gallbladder wall thickening and edema with cholecystostomy tube in place, without radiopaque stones or bile duct dilatation. 3. 2 mm stone in the lower pole of the left kidney, without hydronephrosis or hydroureter. 4. Sigmoid diverticulosis without evidence of acute diverticulitis.   MRI abdomen, MRCP IMPRESSION: 1. Minimal dilatation of the common bile duct (7 mm) with tiny signal abnormality in the distal common bile duct most consistent with a tiny distal CBC stone. 2. Cholecystostomy tube in place with tip within the lumen of the gallbladder, which is fluid filled but nondistended, measuring 2 cm in diameter. 3. No pancreatic ductal dilatation or inflammation. No peripancreatic fluid collections.   He was admitted to the hospital for choledocholithiasis.    Assessment/Plan:   Principal Problem:   Acute gallstone pancreatitis Active Problems:   History of acute cholecystitis s/p percutaneous insertion of cholecystostomy tube 04/29/2024   CAD with history of CABG/Stent   Common bile duct stone   Diabetes mellitus without complication (HCC)   Essential hypertension   History of CVA (cerebrovascular accident)   Ascending cholangitis (HCC)    Bacteremia due to Klebsiella pneumoniae   Acute pancreatitis    Body mass index is 31.63 kg/m.  (Class I obesity)   Ascending cholangitis, choledocholithiasis: Still requiring IV Dilaudid  for pain. S/p ERCP in which the bile duct could not be cannulated but a small stone did come out with manipulation of the ampulla as per GI on 05/25/24. S/p percutaneous transhepatic cholangiogram w/ biliary drain placement on 05/26/2024 as per IR.    Klebsiella pneumoniae bacteremia, Aeromonas bacteremia: Continue IV cefepime .  Follow-up repeat blood culture from 06/01/2024. Follow-up with ID for further recommendations. Initial blood culture from 05/28/2024 showed Klebsiella pneumonia and Aeromonas hydrophila. Previously treated with broad-spectrum antibiotics including IV vancomycin , Flagyl  and cefoxitin    Acute gallstone pancreatitis: s/p percutaneous cholecystostomy 04/29/2024 for acute cholecystitis.  Patient is scheduled for cholecystectomy in February 2026 per Dr. Desiderio, general surgeon.   Elevated liver enzymes: Resolved   Bed bugs infestation: S/p permethrin  5% treatment on 05/25/2024   Comorbidities include CAD s/p CABG and coronary stent, hypertension, type II DM (hemoglobin A1c 6.6), history of stroke   Transfer from progressive care unit to MedSurg unit.    Diet Order             Diet Carb Modified  Diet effective now                                  Consultants: ID specialist Gastroenterologist Interventional radiologist  Procedures: ERCP on 05/25/2024 Percutaneous transhepatic cholangiogram on 05/26/2024    Medications:    aspirin   81 mg Oral Daily   feeding supplement  237 mL Oral BID  BM   insulin  aspart  0-9 Units Subcutaneous Q4H   metoprolol  tartrate  50 mg Oral BID   pantoprazole   40 mg Oral Daily   polyethylene glycol  17 g Oral Daily   rosuvastatin   40 mg Oral Daily   Continuous Infusions:  ceFEPime  (MAXIPIME ) IV 2 g (06/01/24 0916)      Anti-infectives (From admission, onward)    Start     Dose/Rate Route Frequency Ordered Stop   05/29/24 2200  vancomycin  (VANCOREADY) IVPB 1750 mg/350 mL  Status:  Discontinued        1,750 mg 175 mL/hr over 120 Minutes Intravenous Every 24 hours 05/28/24 2043 05/29/24 0925   05/29/24 0800  ceFEPIme  (MAXIPIME ) 2 g in sodium chloride  0.9 % 100 mL IVPB        2 g 200 mL/hr over 30 Minutes Intravenous Every 8 hours 05/28/24 2053     05/28/24 2115  ceFEPIme  (MAXIPIME ) 2 g in sodium chloride  0.9 % 100 mL IVPB        2 g 200 mL/hr over 30 Minutes Intravenous  Once 05/28/24 2028 05/29/24 0035   05/28/24 2115  metroNIDAZOLE  (FLAGYL ) IVPB 500 mg  Status:  Discontinued        500 mg 100 mL/hr over 60 Minutes Intravenous Every 12 hours 05/28/24 2028 05/31/24 2134   05/28/24 2115  vancomycin  (VANCOCIN ) IVPB 1000 mg/200 mL premix  Status:  Discontinued        1,000 mg 200 mL/hr over 60 Minutes Intravenous  Once 05/28/24 2028 05/28/24 2040   05/28/24 2100  vancomycin  (VANCOREADY) IVPB 2000 mg/400 mL        2,000 mg 200 mL/hr over 120 Minutes Intravenous Once 05/28/24 2040 05/29/24 0309   05/27/24 0600  cefOXitin  (MEFOXIN ) 2 g in sodium chloride  0.9 % 100 mL IVPB        2 g 200 mL/hr over 30 Minutes Intravenous To Radiology 05/27/24 0356 05/27/24 1045   05/27/24 0300  cefOXitin  (MEFOXIN ) 2 g in sodium chloride  0.9 % 100 mL IVPB  Status:  Discontinued        2 g 200 mL/hr over 30 Minutes Intravenous To Radiology 05/26/24 1545 05/27/24 0356              Family Communication/Anticipated D/C date and plan/Code Status   DVT prophylaxis: SCDs Start: 05/25/24 0320     Code Status: Full Code  Family Communication: None Disposition Plan: Plan to discharge home   Status is: Inpatient Remains inpatient appropriate because: Ascending cholangitis, bacteremia       Subjective:   Interval events noted.  He complains of right upper abdominal pain and indigestion.  Objective:     Vitals:   06/01/24 0812 06/01/24 0900 06/01/24 1220 06/01/24 1416  BP: (!) 146/96  135/82 (!) 140/81  Pulse: 80 89 71 76  Resp: 16  18 16   Temp: (!) 97.2 F (36.2 C) 98 F (36.7 C) 98.6 F (37 C) 97.8 F (36.6 C)  TempSrc:      SpO2: 96% 97% 96% 95%  Weight:      Height:       No data found.   Intake/Output Summary (Last 24 hours) at 06/01/2024 1435 Last data filed at 06/01/2024 1417 Gross per 24 hour  Intake 1202.56 ml  Output 1590 ml  Net -387.44 ml   Filed Weights   05/24/24 1833  Weight: 81 kg    Exam:  GEN: NAD SKIN: Warm and dry EYES: No pallor or  icterus ENT: MMM CV: RRR PULM: CTA B ABD: soft, ND, NT, +BS + drain in RUQ with dark greenish fluid CNS: AAO x 3, non focal EXT: No edema or tenderness        Data Reviewed:   I have personally reviewed following labs and imaging studies:  Labs: Labs show the following:   Basic Metabolic Panel: Recent Labs  Lab 05/28/24 1106 05/28/24 2027 05/29/24 0448 05/30/24 0423 05/31/24 1015  NA 136 135 134* 137 135  K 3.8 3.5 3.7 3.7 3.7  CL 102 101 105 107 102  CO2 23 21* 19* 20* 22  GLUCOSE 122* 114* 135* 93 106*  BUN 12 16 16 14 11   CREATININE 0.90 1.14 0.92 0.69 0.72  CALCIUM  8.7* 8.6* 8.3* 7.8* 8.3*  MG  --  1.5*  --   --   --    GFR Estimated Creatinine Clearance: 87.7 mL/min (by C-G formula based on SCr of 0.72 mg/dL). Liver Function Tests: Recent Labs  Lab 05/26/24 0508 05/27/24 1325 05/28/24 1106 05/28/24 2027 05/29/24 0448  AST 85* 67* 25 26 37  ALT 122* 88* 56* 50* 44  ALKPHOS 120 149* 120 152* 98  BILITOT 1.6* 1.4* 1.0 1.4* 1.2  PROT 6.4* 7.1 7.2 7.1 6.4*  ALBUMIN  3.2* 3.5 3.4* 3.4* 3.0*   No results for input(s): LIPASE, AMYLASE in the last 168 hours. No results for input(s): AMMONIA in the last 168 hours. Coagulation profile Recent Labs  Lab 05/26/24 1557 05/28/24 2027  INR 1.1 1.2    CBC: Recent Labs  Lab 05/28/24 2027 05/29/24 0448 05/30/24 0423  05/31/24 1015 06/01/24 0412  WBC 2.3* 11.8* 9.8 8.7 9.2  NEUTROABS 1.8  --   --   --   --   HGB 12.2* 11.0* 10.6* 12.9* 12.1*  HCT 36.3* 32.8* 31.9* 38.1* 35.6*  MCV 86.8 87.7 87.9 85.6 86.2  PLT 140* 135* 114* 145* 155   Cardiac Enzymes: No results for input(s): CKTOTAL, CKMB, CKMBINDEX, TROPONINI in the last 168 hours. BNP (last 3 results) No results for input(s): PROBNP in the last 8760 hours. CBG: Recent Labs  Lab 05/31/24 2206 05/31/24 2325 06/01/24 0453 06/01/24 0814 06/01/24 1306  GLUCAP 108* 94 94 107* 99   D-Dimer: No results for input(s): DDIMER in the last 72 hours. Hgb A1c: No results for input(s): HGBA1C in the last 72 hours. Lipid Profile: Recent Labs    05/31/24 0401  CHOL 51  HDL 14*  LDLCALC 15  TRIG 886  CHOLHDL 3.7   Thyroid  function studies: No results for input(s): TSH, T4TOTAL, T3FREE, THYROIDAB in the last 72 hours.  Invalid input(s): FREET3 Anemia work up: No results for input(s): VITAMINB12, FOLATE, FERRITIN, TIBC, IRON, RETICCTPCT in the last 72 hours. Sepsis Labs: Recent Labs  Lab 05/28/24 2027 05/28/24 2222 05/29/24 0448 05/30/24 0423 05/31/24 1015 06/01/24 0412  WBC 2.3*  --  11.8* 9.8 8.7 9.2  LATICACIDVEN 2.6* 1.8  --   --   --   --     Microbiology Recent Results (from the past 240 hours)  Culture, blood (Routine X 2) w Reflex to ID Panel     Status: None   Collection Time: 05/27/24  1:24 PM   Specimen: BLOOD  Result Value Ref Range Status   Specimen Description BLOOD BLOOD RIGHT ARM  Final   Special Requests   Final    BOTTLES DRAWN AEROBIC AND ANAEROBIC Blood Culture adequate volume   Culture   Final  NO GROWTH 5 DAYS Performed at [facility], [address] [place], [place] [address]    Report Status 06/01/2024 FINAL  Final  Culture, blood (Routine X 2) w Reflex to ID Panel     Status: None   Collection Time: 05/27/24  1:24 PM   Specimen: BLOOD  Result Value  Ref Range Status   Specimen Description BLOOD BLOOD RIGHT HAND  Final   Special Requests   Final    BOTTLES DRAWN AEROBIC AND ANAEROBIC Blood Culture adequate volume   Culture   Final    NO GROWTH 5 DAYS Performed at [facility], [address] [place], [place] [address]    Report Status 06/01/2024 FINAL  Final  Culture, blood (Routine X 2) w Reflex to ID Panel     Status: Abnormal (Preliminary result)   Collection Time: 05/28/24  8:58 PM   Specimen: BLOOD  Result Value Ref Range Status   Specimen Description   Final    BLOOD BLOOD RIGHT ARM Performed at [facility], [address], [place], [place] [address]    Special Requests   Final    BOTTLES DRAWN AEROBIC AND ANAEROBIC Blood Culture adequate volume Performed at [facility], [address] [place], [place] [address]    Culture  Setup Time   Final    GRAM NEGATIVE RODS IN BOTH AEROBIC AND ANAEROBIC BOTTLES CRITICAL RESULT CALLED TO, READ BACK BY AND VERIFIED WITH: CATHALEEN BLANCH 05/29/24 0841 MW Performed at [facility], [address] [place], [place] [address]    Culture (A)  Final    KLEBSIELLA PNEUMONIAE SUSCEPTIBILITIES PERFORMED ON PREVIOUS CULTURE WITHIN THE LAST 5 DAYS. AEROMONAS HYDROPHILA GROUP Sent to [facility] for further susceptibility testing. Performed at [facility], [address], [place], [place] [address]    Report Status PENDING  Incomplete  Culture, blood (Routine X 2) w Reflex to ID Panel     Status: Abnormal   Collection Time: 05/28/24  9:00 PM   Specimen: BLOOD  Result Value Ref Range Status   Specimen Description   Final    BLOOD BLOOD RIGHT HAND Performed at [facility] [address] [place], [place] [address]    Special Requests   Final    BOTTLES DRAWN AEROBIC AND ANAEROBIC Blood Culture adequate volume Performed at [facility], [address] [place], [place] [address]    Culture  Setup Time   Final    Organism  ID to follow GRAM NEGATIVE RODS IN BOTH AEROBIC AND ANAEROBIC BOTTLES CRITICAL RESULT CALLED TO, READ BACK BY AND VERIFIED WITH: CATHALEEN PATEL 05/29/24 0841 MW CRITICAL RESULT CALLED TO, READ BACK BY AND VERIFIED WITH: RN AMBER PAYNE [id number] AT 1248 BY EC Performed at [facility], [address], [place], [place] [address]    Culture (A)  Final    KLEBSIELLA PNEUMONIAE AEROMONAS HYDROPHILA GROUP    Report Status 05/31/2024 FINAL  Final   Organism ID, Bacteria KLEBSIELLA PNEUMONIAE  Final      Susceptibility   Klebsiella pneumoniae - MIC*    AMPICILLIN >=32 RESISTANT Resistant     CEFAZOLIN (NON-URINE) 4 INTERMEDIATE Intermediate     CEFEPIME  <=0.12 SENSITIVE Sensitive     ERTAPENEM <=0.12 SENSITIVE Sensitive     CEFTRIAXONE  <=0.25 SENSITIVE Sensitive     CIPROFLOXACIN  <=0.06 SENSITIVE Sensitive     GENTAMICIN <=1 SENSITIVE Sensitive     MEROPENEM <=0.25 SENSITIVE Sensitive  TRIMETH/SULFA <=20 SENSITIVE Sensitive     AMPICILLIN/SULBACTAM 16 INTERMEDIATE Intermediate     PIP/TAZO Value in next row Sensitive      <=4 SENSITIVEThis is a modified FDA-approved test that has been validated and its performance characteristics determined by the reporting laboratory.  This laboratory is certified under the Clinical Laboratory Improvement Amendments CLIA as qualified to perform high complexity clinical laboratory testing.    * KLEBSIELLA PNEUMONIAE  Blood Culture ID Panel (Reflexed)     Status: Abnormal   Collection Time: 05/28/24  9:00 PM  Result Value Ref Range Status   Enterococcus faecalis NOT DETECTED NOT DETECTED Final    Comment: Performed at [facility], [address], [place], [place] [address]   Enterococcus Faecium NOT DETECTED NOT DETECTED Final    Comment: Performed at [facility], [address], [place], [place] [address]   Listeria monocytogenes NOT DETECTED NOT DETECTED Final    Comment: Performed at [facility], [address]  [place], [place] [address]   Staphylococcus species NOT DETECTED NOT DETECTED Final    Comment: Performed at [facility], [address], [place], [place] [address]   Staphylococcus aureus (BCID) NOT DETECTED NOT DETECTED Final    Comment: Performed at [facility], [address] [place], [place] [address]   Staphylococcus epidermidis NOT DETECTED NOT DETECTED Final    Comment: Performed at [facility], [address], [place], [place] [address]   Staphylococcus lugdunensis NOT DETECTED NOT DETECTED Final    Comment: Performed at [facility], [address], [place], [place] [address]   Streptococcus species NOT DETECTED NOT DETECTED Final    Comment: Performed at [facility], [address], [place], [place] [address]   Streptococcus agalactiae NOT DETECTED NOT DETECTED Final    Comment: Performed at [facility], [address] [place], [place] [address]   Streptococcus pneumoniae NOT DETECTED NOT DETECTED Final    Comment: Performed at [facility], [address] [place], [place] [address]   Streptococcus pyogenes NOT DETECTED NOT DETECTED Final    Comment: Performed at [facility], [address], [place], [place] [address]   A.calcoaceticus-baumannii NOT DETECTED NOT DETECTED Final    Comment: Performed at [facility], [address] [place], [place] [address]   Bacteroides fragilis NOT DETECTED NOT DETECTED Final    Comment: Performed at [facility], [address], [place], [place] [address]   Enterobacterales DETECTED (A) NOT DETECTED Corrected    Comment: Enterobacterales represent a large order of gram negative bacteria, not a single organism. CRITICAL RESULT CALLED TO, READ BACK BY AND VERIFIED WITH: MADISON HUNT PHARMD 1300 05/30/24 HNM Performed at [facility], [address] [place], [place] [address] CORRECTED ON 01/12 AT 1310: PREVIOUSLY REPORTED AS DETECTED Enterobacterales represent a large order  of gram negative bacteria, not a single organism. CRITICAL RESULT CALLED TO, READ BACK BY AND VERIFIED WITH: PHARMD AMBER PAYNE [id number] AT 1248 BY EC    Enterobacter cloacae complex NOT DETECTED NOT DETECTED Final    Comment: Performed at [facility], [address] [place], [place] [address]   Escherichia coli NOT DETECTED NOT DETECTED Final    Comment: Performed at [facility], [address], [place], [place] [address]   Klebsiella aerogenes NOT DETECTED NOT DETECTED Final    Comment:  Performed at [facility], [address] [place], [place] [address]   Klebsiella oxytoca NOT DETECTED NOT DETECTED Final    Comment: Performed at [facility], [address], [place], [place] [address]   Klebsiella pneumoniae DETECTED (A) NOT DETECTED Corrected    Comment: CRITICAL RESULT CALLED TO, READ BACK BY AND VERIFIED WITH: MADISON HUNT PHARMD 1300 05/30/24 HNM Performed at [facility], [address] [place], [place] [address] CORRECTED ON 01/12 AT 1310: PREVIOUSLY REPORTED AS DETECTED CRITICAL RESULT CALLED TO, READ BACK BY AND VERIFIED WITH: PHARMD AMBER PAYNE [id number] AT 1248 BY EC    Proteus species NOT DETECTED NOT DETECTED Final   Salmonella species NOT DETECTED NOT DETECTED Final   Serratia marcescens NOT DETECTED NOT DETECTED Final   Haemophilus influenzae NOT DETECTED NOT DETECTED Final   Neisseria meningitidis NOT DETECTED NOT DETECTED Final   Pseudomonas aeruginosa NOT DETECTED NOT DETECTED Final   Stenotrophomonas maltophilia NOT DETECTED NOT DETECTED Final   Candida albicans NOT DETECTED NOT DETECTED Final   Candida auris NOT DETECTED NOT DETECTED Final   Candida glabrata NOT DETECTED NOT DETECTED Final   Candida krusei NOT DETECTED NOT DETECTED Final   Candida parapsilosis NOT DETECTED NOT DETECTED Final   Candida tropicalis NOT DETECTED NOT DETECTED Final   Cryptococcus neoformans/gattii NOT DETECTED NOT DETECTED Final   CTX-M ESBL NOT  DETECTED NOT DETECTED Final   Carbapenem resistance IMP NOT DETECTED NOT DETECTED Final   Carbapenem resistance KPC NOT DETECTED NOT DETECTED Final   Carbapenem resistance NDM NOT DETECTED NOT DETECTED Final   Carbapenem resist OXA 48 LIKE NOT DETECTED NOT DETECTED Final   Carbapenem resistance VIM NOT DETECTED NOT DETECTED Final    Comment: Performed at [facility], [address] [place], [place] [address]    Procedures and diagnostic studies:  No results found.             LOS: 6 days   Shelden Raborn  [facility]   Pager on [email or web address]. If 7PM-7AM, please contact night-coverage at [email or web address]     06/01/2024, 2:35 PM           "

## 2024-06-01 NOTE — Plan of Care (Signed)
  Problem: Education: Goal: Ability to describe self-care measures that may prevent or decrease complications (Diabetes Survival Skills Education) will improve Outcome: Progressing Goal: Individualized Educational Video(s) Outcome: Progressing   Problem: Coping: Goal: Ability to adjust to condition or change in health will improve Outcome: Progressing   Problem: Fluid Volume: Goal: Ability to maintain a balanced intake and output will improve Outcome: Progressing   Problem: Health Behavior/Discharge Planning: Goal: Ability to identify and utilize available resources and services will improve Outcome: Progressing Goal: Ability to manage health-related needs will improve Outcome: Progressing   Problem: Metabolic: Goal: Ability to maintain appropriate glucose levels will improve Outcome: Progressing   Problem: Nutritional: Goal: Maintenance of adequate nutrition will improve Outcome: Progressing Goal: Progress toward achieving an optimal weight will improve Outcome: Progressing   Problem: Skin Integrity: Goal: Risk for impaired skin integrity will decrease Outcome: Progressing   Problem: Tissue Perfusion: Goal: Adequacy of tissue perfusion will improve Outcome: Progressing   Problem: Education: Goal: Knowledge of General Education information will improve Description: Including pain rating scale, medication(s)/side effects and non-pharmacologic comfort measures Outcome: Progressing   Problem: Health Behavior/Discharge Planning: Goal: Ability to manage health-related needs will improve Outcome: Progressing   Problem: Clinical Measurements: Goal: Ability to maintain clinical measurements within normal limits will improve Outcome: Progressing Goal: Will remain free from infection Outcome: Progressing Goal: Diagnostic test results will improve Outcome: Progressing Goal: Respiratory complications will improve Outcome: Progressing Goal: Cardiovascular complication will  be avoided Outcome: Progressing   Problem: Activity: Goal: Risk for activity intolerance will decrease Outcome: Progressing   Problem: Nutrition: Goal: Adequate nutrition will be maintained Outcome: Progressing   Problem: Coping: Goal: Level of anxiety will decrease Outcome: Progressing   Problem: Elimination: Goal: Will not experience complications related to bowel motility Outcome: Progressing Goal: Will not experience complications related to urinary retention Outcome: Progressing   Problem: Pain Managment: Goal: General experience of comfort will improve and/or be controlled Outcome: Progressing   Problem: Safety: Goal: Ability to remain free from injury will improve Outcome: Progressing   Problem: Skin Integrity: Goal: Risk for impaired skin integrity will decrease Outcome: Progressing   Problem: Fluid Volume: Goal: Hemodynamic stability will improve Outcome: Progressing   Problem: Clinical Measurements: Goal: Diagnostic test results will improve Outcome: Progressing Goal: Signs and symptoms of infection will decrease Outcome: Progressing   Problem: Respiratory: Goal: Ability to maintain adequate ventilation will improve Outcome: Progressing

## 2024-06-01 NOTE — Plan of Care (Signed)

## 2024-06-02 LAB — GLUCOSE, CAPILLARY
Glucose-Capillary: 103 mg/dL — ABNORMAL HIGH (ref 70–99)
Glucose-Capillary: 104 mg/dL — ABNORMAL HIGH (ref 70–99)
Glucose-Capillary: 85 mg/dL (ref 70–99)
Glucose-Capillary: 95 mg/dL (ref 70–99)
Glucose-Capillary: 96 mg/dL (ref 70–99)

## 2024-06-02 MED ORDER — INSULIN ASPART 100 UNIT/ML IJ SOLN: 0.0000 [IU] | Freq: Three times a day (TID) | INTRAMUSCULAR | Status: DC

## 2024-06-02 NOTE — Progress Notes (Signed)
 Mobility Specialist - Progress Note    06/02/24 1200  Mobility  Activity Ambulated with assistance;Stood with assistance;Dangled on edge of bed  Level of Assistance Standby assist, set-up cues, supervision of patient - no hands on  Assistive Device Front wheel walker  Distance Ambulated (ft) 15 ft  Range of Motion/Exercises Active  Activity Response Tolerated fair  Mobility visit 1 Mobility  Mobility Specialist Start Time (ACUTE ONLY) 1238  Mobility Specialist Stop Time (ACUTE ONLY) 1310  Mobility Specialist Time Calculation (min) (ACUTE ONLY) 32 min   Pt supine in bed with the HOB elevated on RA upon entry. Pt agreed to mobility. Pt is able today to get to the EOB independently with bed features and time. Pt able today to STS independently with 2 WW. Pt ambulated well in the room. After activity pt returned to the bed with needs in reach and bed alarm on.  Clem Rodes Mobility Specialist 06/02/24, 1:03 PM

## 2024-06-02 NOTE — Plan of Care (Signed)
" °  Problem: Skin Integrity: Goal: Risk for impaired skin integrity will decrease Outcome: Progressing   Problem: Nutritional: Goal: Maintenance of adequate nutrition will improve Outcome: Progressing   Problem: Activity: Goal: Risk for activity intolerance will decrease Outcome: Progressing   Problem: Pain Managment: Goal: General experience of comfort will improve and/or be controlled Outcome: Progressing   Problem: Safety: Goal: Ability to remain free from injury will improve Outcome: Progressing   "

## 2024-06-02 NOTE — Progress Notes (Signed)
 Occupational Therapy Treatment Patient Details Name: Jeffery Sweeney MRN: [id number] DOB: [birth year] Today's Date: 06/02/2024   History of present illness presented to ER secondary to abdominal pain; admitted for management of acute gallstone pancreatitis, s/p ERCP 1/7.  Currently with cholecystostomy tube placed 12/12 during recent admission (12/10-16).  Cholecystectomy scheduled for February   OT comments  Pt is supine in bed on arrival. Pleasant and agreeable to OT session. He does not c/o pain. Pt performed bed mobility and STS with supervision. Progressed functional mobility ~140 ft using RW + CGA before fatiguing. Min A for LB dressing to adjust socks. Pt left seated EOB with set up assist for feeding. Provided a red theraband and edu on BUE strengthening exercises to maximize strength/endurance and promote indep with ADLs and transfers. Pt left seated EOB with all needs in place and will cont to require skilled acute OT services to maximize his safety and IND to return to PLOF.       If plan is discharge home, recommend the following:  A little help with walking and/or transfers;A little help with bathing/dressing/bathroom   Equipment Recommendations  None recommended by OT    Recommendations for Other Services      Precautions / Restrictions Precautions Precautions: Fall Recall of Precautions/Restrictions: Impaired Precaution/Restrictions Comments: perc drain x 2 right flank Restrictions Weight Bearing Restrictions Per Provider Order: No       Mobility Bed Mobility Overal bed mobility: Modified Independent                  Transfers Overall transfer level: Needs assistance Equipment used: Rolling walker (2 wheels) Transfers: Sit to/from Stand Sit to Stand: Supervision           General transfer comment: RW used to ambulate ~140 ft with CGA     Balance Overall balance assessment: Needs assistance Sitting-balance support: Feet supported Sitting  balance-Leahy Scale: Good     Standing balance support: Bilateral upper extremity supported, Reliant on assistive device for balance Standing balance-Leahy Scale: Good Standing balance comment: RW + CGA                           ADL either performed or assessed with clinical judgement   ADL Overall ADL's : Needs assistance/impaired Eating/Feeding: Modified independent;Sitting Eating/Feeding Details (indicate cue type and reason): seated at EOB                 Lower Body Dressing: Minimal assistance;Sit to/from stand Lower Body Dressing Details (indicate cue type and reason): assist with adjusting socks             Functional mobility during ADLs: Contact guard assist;Rolling walker (2 wheels);Supervision/safety      Extremity/Trunk Assessment              Occupational Psychologist Communication: No apparent difficulties   Cognition Arousal: Alert Behavior During Therapy: WFL for tasks assessed/performed                                 Following commands: Intact        Cueing   Cueing Techniques: Verbal cues  Exercises Other Exercises Other Exercises: Provided red theraband and edu on BUE strengthening exercises to maximize strength and endurance.    Shoulder Instructions  General Comments      Pertinent Vitals/ Pain       Pain Assessment Pain Assessment: No/denies pain Pain Intervention(s): Monitored during session  Home Living                                          Prior Functioning/Environment              Frequency  Min 2X/week        Progress Toward Goals  OT Goals(current goals can now be found in the care plan section)  Progress towards OT goals: Progressing toward goals  Acute Rehab OT Goals Patient Stated Goal: get better OT Goal Formulation: With patient Time For Goal Achievement: 06/12/24 Potential to Achieve Goals:  Fair  Plan      Co-evaluation                 AM-PAC OT 6 Clicks Daily Activity     Outcome Measure   Help from another person eating meals?: None Help from another person taking care of personal grooming?: A Little Help from another person toileting, which includes using toliet, bedpan, or urinal?: A Little Help from another person bathing (including washing, rinsing, drying)?: A Little Help from another person to put on and taking off regular upper body clothing?: None Help from another person to put on and taking off regular lower body clothing?: A Little 6 Click Score: 20    End of Session Equipment Utilized During Treatment: Rolling walker (2 wheels)  OT Visit Diagnosis: Muscle weakness (generalized) (M62.81)   Activity Tolerance Patient tolerated treatment well   Patient Left in bed;with call bell/phone within reach;with bed alarm set   Nurse Communication Mobility status        Time: 8579-8562 OT Time Calculation (min): 17 min  Charges: OT General Charges $OT Visit: 1 Visit OT Treatments $Therapeutic Exercise: 8-22 mins  Maizee Reinhold Chrismon, OTR/L  06/02/24, 4:00 PM   Artasia Thang E Chrismon 06/02/2024, 3:58 PM

## 2024-06-02 NOTE — Plan of Care (Signed)
  Problem: Pain Managment: Goal: General experience of comfort will improve and/or be controlled Outcome: Progressing   Problem: Safety: Goal: Ability to remain free from injury will improve Outcome: Progressing   Problem: Skin Integrity: Goal: Risk for impaired skin integrity will decrease Outcome: Progressing

## 2024-06-02 NOTE — Progress Notes (Signed)
 "    Progress Note    Jeffery Sweeney  FMW:[id number] DOB: 11-06-1960  DOA: 05/24/2024 PCP: Towana Small, FNP      Brief Narrative:    Medical records reviewed and are as summarized below:  Jeffery Sweeney is a 64 y.o. male with medical history significant for Prior CVA, hypertension, coronary artery disease, status post CABG and PCI and stent, , recently hospitalized (12/10-12/16/25) for acute cholecystitis, undergoing cholecystostomy on 04/29/2024, discharged home on Augmentin . He presented to the ED again on 05/24/2024 with upper abdominal pain.  Labs significant for lipase 1,518, AST 504, ALT 201, ALP 130, bilirubin 1.7   CT abdomen and pelvis IMPRESSION: 1. No CT evidence of acute pancreatitis. 2. Mild gallbladder wall thickening and edema with cholecystostomy tube in place, without radiopaque stones or bile duct dilatation. 3. 2 mm stone in the lower pole of the left kidney, without hydronephrosis or hydroureter. 4. Sigmoid diverticulosis without evidence of acute diverticulitis.   MRI abdomen, MRCP IMPRESSION: 1. Minimal dilatation of the common bile duct (7 mm) with tiny signal abnormality in the distal common bile duct most consistent with a tiny distal CBC stone. 2. Cholecystostomy tube in place with tip within the lumen of the gallbladder, which is fluid filled but nondistended, measuring 2 cm in diameter. 3. No pancreatic ductal dilatation or inflammation. No peripancreatic fluid collections.   He was admitted to the hospital for choledocholithiasis.    Assessment/Plan:   Principal Problem:   Acute gallstone pancreatitis Active Problems:   History of acute cholecystitis s/p percutaneous insertion of cholecystostomy tube 04/29/2024   CAD with history of CABG/Stent   Common bile duct stone   Diabetes mellitus without complication (HCC)   Essential hypertension   History of CVA (cerebrovascular accident)   Ascending cholangitis (HCC)    Bacteremia due to Klebsiella pneumoniae   Acute pancreatitis    Body mass index is 31.63 kg/m.  (Class I obesity)   Ascending cholangitis, choledocholithiasis:  S/p ERCP in which the bile duct could not be cannulated but a small stone did come out with manipulation of the ampulla as per GI on 05/25/24. S/p percutaneous transhepatic cholangiogram w/ biliary drain placement on 05/26/2024 as per IR.  Discontinue IV Dilaudid .  Tylenol  and Vicodin as needed for pain.   Klebsiella pneumoniae bacteremia, Aeromonas bacteremia: No growth on repeat blood culture from 06/01/2024 thus far.  Continue IV cefepime .  Plan to switch to oral Levaquin  through 06/10/2024 if no prolonged QTc on repeat EKG.  EKG on 05/28/2024 showed prolonged QTc at 569. Initial blood culture from 05/28/2024 showed Klebsiella pneumonia and Aeromonas hydrophila. Previously treated with broad-spectrum antibiotics including IV vancomycin , Flagyl  and cefoxitin    Acute gallstone pancreatitis: s/p percutaneous cholecystostomy 04/29/2024 for acute cholecystitis.  Patient is scheduled for cholecystectomy in February 2026 per Dr. Desiderio, general surgeon.   Elevated liver enzymes: Resolved   Bed bugs infestation: S/p permethrin  5% treatment on 05/25/2024   Comorbidities include CAD s/p CABG and coronary stent, hypertension, type II DM (hemoglobin A1c 6.6), history of stroke    Diet Order             Diet Carb Modified  Diet effective now                                  Consultants: ID specialist Gastroenterologist Interventional radiologist  Procedures: ERCP on 05/25/2024 Percutaneous transhepatic cholangiogram on 05/26/2024  Medications:    aspirin   81 mg Oral Daily   feeding supplement  237 mL Oral BID BM   insulin  aspart  0-9 Units Subcutaneous TID WC   metoprolol  tartrate  50 mg Oral BID   pantoprazole   40 mg Oral Daily   polyethylene glycol  17 g Oral Daily   rosuvastatin   40 mg Oral Daily    Continuous Infusions:  ceFEPime  (MAXIPIME ) IV 2 g (06/02/24 0848)     Anti-infectives (From admission, onward)    Start     Dose/Rate Route Frequency Ordered Stop   05/29/24 2200  vancomycin  (VANCOREADY) IVPB 1750 mg/350 mL  Status:  Discontinued        1,750 mg 175 mL/hr over 120 Minutes Intravenous Every 24 hours 05/28/24 2043 05/29/24 0925   05/29/24 0800  ceFEPIme  (MAXIPIME ) 2 g in sodium chloride  0.9 % 100 mL IVPB        2 g 200 mL/hr over 30 Minutes Intravenous Every 8 hours 05/28/24 2053     05/28/24 2115  ceFEPIme  (MAXIPIME ) 2 g in sodium chloride  0.9 % 100 mL IVPB        2 g 200 mL/hr over 30 Minutes Intravenous  Once 05/28/24 2028 05/29/24 0035   05/28/24 2115  metroNIDAZOLE  (FLAGYL ) IVPB 500 mg  Status:  Discontinued        500 mg 100 mL/hr over 60 Minutes Intravenous Every 12 hours 05/28/24 2028 05/31/24 2134   05/28/24 2115  vancomycin  (VANCOCIN ) IVPB 1000 mg/200 mL premix  Status:  Discontinued        1,000 mg 200 mL/hr over 60 Minutes Intravenous  Once 05/28/24 2028 05/28/24 2040   05/28/24 2100  vancomycin  (VANCOREADY) IVPB 2000 mg/400 mL        2,000 mg 200 mL/hr over 120 Minutes Intravenous Once 05/28/24 2040 05/29/24 0309   05/27/24 0600  cefOXitin  (MEFOXIN ) 2 g in sodium chloride  0.9 % 100 mL IVPB        2 g 200 mL/hr over 30 Minutes Intravenous To Radiology 05/27/24 0356 05/27/24 1045   05/27/24 0300  cefOXitin  (MEFOXIN ) 2 g in sodium chloride  0.9 % 100 mL IVPB  Status:  Discontinued        2 g 200 mL/hr over 30 Minutes Intravenous To Radiology 05/26/24 1545 05/27/24 0356              Family Communication/Anticipated D/C date and plan/Code Status   DVT prophylaxis: SCDs Start: 05/25/24 0320     Code Status: Full Code  Family Communication: None Disposition Plan: Plan to discharge home   Status is: Inpatient Remains inpatient appropriate because: Ascending cholangitis, bacteremia       Subjective:   Interval events noted.  No  complaints.  Objective:    Vitals:   06/02/24 0518 06/02/24 0746 06/02/24 1457 06/02/24 1635  BP: (!) 148/82 134/84 130/64 138/75  Pulse: 68 76 74 81  Resp: 18 16 16 19   Temp: 98.1 F (36.7 C) 97.9 F (36.6 C) 97.9 F (36.6 C) 98.4 F (36.9 C)  TempSrc: Oral     SpO2: 94% 92% 99% 92%  Weight:      Height:       No data found.   Intake/Output Summary (Last 24 hours) at 06/02/2024 1710 Last data filed at 06/02/2024 0755 Gross per 24 hour  Intake 10 ml  Output 650 ml  Net -640 ml   Filed Weights   05/24/24 1833  Weight: 81 kg    Exam:  GEN: NAD SKIN: Warm and dry EYES: No pallor or icterus ENT: MMM CV: RRR PULM: CTA B ABD: soft, ND, NT, +BS, + drain in RUQ with dark green fluid CNS: AAO x 3, non focal EXT: No edema or tenderness       Data Reviewed:   I have personally reviewed following labs and imaging studies:  Labs: Labs show the following:   Basic Metabolic Panel: Recent Labs  Lab 05/28/24 1106 05/28/24 2027 05/29/24 0448 05/30/24 0423 05/31/24 1015  NA 136 135 134* 137 135  K 3.8 3.5 3.7 3.7 3.7  CL 102 101 105 107 102  CO2 23 21* 19* 20* 22  GLUCOSE 122* 114* 135* 93 106*  BUN 12 16 16 14 11   CREATININE 0.90 1.14 0.92 0.69 0.72  CALCIUM  8.7* 8.6* 8.3* 7.8* 8.3*  MG  --  1.5*  --   --   --    GFR Estimated Creatinine Clearance: 87.7 mL/min (by C-G formula based on SCr of 0.72 mg/dL). Liver Function Tests: Recent Labs  Lab 05/27/24 1325 05/28/24 1106 05/28/24 2027 05/29/24 0448  AST 67* 25 26 37  ALT 88* 56* 50* 44  ALKPHOS 149* 120 152* 98  BILITOT 1.4* 1.0 1.4* 1.2  PROT 7.1 7.2 7.1 6.4*  ALBUMIN  3.5 3.4* 3.4* 3.0*   No results for input(s): LIPASE, AMYLASE in the last 168 hours. No results for input(s): AMMONIA in the last 168 hours. Coagulation profile Recent Labs  Lab 05/28/24 2027  INR 1.2    CBC: Recent Labs  Lab 05/28/24 2027 05/29/24 0448 05/30/24 0423 05/31/24 1015 06/01/24 0412  WBC 2.3*  11.8* 9.8 8.7 9.2  NEUTROABS 1.8  --   --   --   --   HGB 12.2* 11.0* 10.6* 12.9* 12.1*  HCT 36.3* 32.8* 31.9* 38.1* 35.6*  MCV 86.8 87.7 87.9 85.6 86.2  PLT 140* 135* 114* 145* 155   Cardiac Enzymes: No results for input(s): CKTOTAL, CKMB, CKMBINDEX, TROPONINI in the last 168 hours. BNP (last 3 results) No results for input(s): PROBNP in the last 8760 hours. CBG: Recent Labs  Lab 06/01/24 1954 06/02/24 0006 06/02/24 0355 06/02/24 0755 06/02/24 1218  GLUCAP 102* 103* 85 95 104*   D-Dimer: No results for input(s): DDIMER in the last 72 hours. Hgb A1c: No results for input(s): HGBA1C in the last 72 hours. Lipid Profile: Recent Labs    05/31/24 0401  CHOL 51  HDL 14*  LDLCALC 15  TRIG 886  CHOLHDL 3.7   Thyroid  function studies: No results for input(s): TSH, T4TOTAL, T3FREE, THYROIDAB in the last 72 hours.  Invalid input(s): FREET3 Anemia work up: No results for input(s): VITAMINB12, FOLATE, FERRITIN, TIBC, IRON, RETICCTPCT in the last 72 hours. Sepsis Labs: Recent Labs  Lab 05/28/24 2027 05/28/24 2222 05/29/24 0448 05/30/24 0423 05/31/24 1015 06/01/24 0412  WBC 2.3*  --  11.8* 9.8 8.7 9.2  LATICACIDVEN 2.6* 1.8  --   --   --   --     Microbiology Recent Results (from the past 240 hours)  Culture, blood (Routine X 2) w Reflex to ID Panel     Status: None   Collection Time: 05/27/24  1:24 PM   Specimen: BLOOD  Result Value Ref Range Status   Specimen Description BLOOD BLOOD RIGHT ARM  Final   Special Requests   Final    BOTTLES DRAWN AEROBIC AND ANAEROBIC Blood Culture adequate volume   Culture   Final    NO GROWTH  5 DAYS Performed at [facility], [address] [place], [place] [address]    Report Status 06/01/2024 FINAL  Final  Culture, blood (Routine X 2) w Reflex to ID Panel     Status: None   Collection Time: 05/27/24  1:24 PM   Specimen: BLOOD  Result Value Ref Range Status   Specimen Description  BLOOD BLOOD RIGHT HAND  Final   Special Requests   Final    BOTTLES DRAWN AEROBIC AND ANAEROBIC Blood Culture adequate volume   Culture   Final    NO GROWTH 5 DAYS Performed at [facility], [address] [place], [place] [address]    Report Status 06/01/2024 FINAL  Final  Susceptibility, Aer + Anaerob     Status: Abnormal   Collection Time: 05/28/24 12:00 PM  Result Value Ref Range Status   Suscept, Aer + Anaerob Preliminary report (A)  Final    Comment: (NOTE) Performed At: [facility] [address] [place], [place] [id number] Jennette Shorter MD Ey:[id number]    Source BLOOD/ AEROMONAS HYDROPHILA  Final    Comment: Performed at [facility], [address] [place], [place] [address]  Susceptibility Result     Status: Abnormal   Collection Time: 05/28/24 12:00 PM  Result Value Ref Range Status   Suscept Result 1 Aeromonas hydrophila (A)  Final    Comment: (NOTE) Identification performed by account, not confirmed by this laboratory. Performed At: [facility] [address] [place], [place] [id number] Jennette Shorter MD Ey:[id number]   Culture, blood (Routine X 2) w Reflex to ID Panel     Status: Abnormal (Preliminary result)   Collection Time: 05/28/24  8:58 PM   Specimen: BLOOD  Result Value Ref Range Status   Specimen Description   Final    BLOOD BLOOD RIGHT ARM Performed at [facility], [address], [place], [place] [address]    Special Requests   Final    BOTTLES DRAWN AEROBIC AND ANAEROBIC Blood Culture adequate volume Performed at [facility], [address], [place], [place] [address]    Culture  Setup Time   Final    GRAM NEGATIVE RODS IN BOTH AEROBIC AND ANAEROBIC BOTTLES CRITICAL RESULT CALLED TO, READ BACK BY AND VERIFIED WITH: CATHALEEN BLANCH 05/29/24 0841 MW Performed at [facility], [address] [place], [place] [address]    Culture (A)  Final    KLEBSIELLA  PNEUMONIAE SUSCEPTIBILITIES PERFORMED ON PREVIOUS CULTURE WITHIN THE LAST 5 DAYS. AEROMONAS HYDROPHILA GROUP Sent to [facility] for further susceptibility testing. Performed at [facility], [address] [place], [place] [address]    Report Status PENDING  Incomplete  Culture, blood (Routine X 2) w Reflex to ID Panel     Status: Abnormal   Collection Time: 05/28/24  9:00 PM   Specimen: BLOOD  Result Value Ref Range Status   Specimen Description   Final    BLOOD BLOOD RIGHT HAND Performed at [facility], [address] [place], [place] [address]    Special Requests   Final    BOTTLES DRAWN AEROBIC AND ANAEROBIC Blood Culture adequate volume Performed at [facility], [address] [place], [place] [address]    Culture  Setup Time   Final    Organism ID to follow GRAM NEGATIVE RODS IN BOTH AEROBIC AND ANAEROBIC BOTTLES CRITICAL RESULT CALLED TO, READ BACK BY AND VERIFIED WITH: CATHALEEN BLANCH 05/29/24 0841 MW CRITICAL RESULT CALLED  TO, READ BACK BY AND VERIFIED WITH: RN AMBER PAYNE [id number] AT 1248 BY EC Performed at [facility], [address] [place], [place] [address]    Culture (A)  Final    KLEBSIELLA PNEUMONIAE AEROMONAS HYDROPHILA GROUP    Report Status 05/31/2024 FINAL  Final   Organism ID, Bacteria KLEBSIELLA PNEUMONIAE  Final      Susceptibility   Klebsiella pneumoniae - MIC*    AMPICILLIN >=32 RESISTANT Resistant     CEFAZOLIN (NON-URINE) 4 INTERMEDIATE Intermediate     CEFEPIME  <=0.12 SENSITIVE Sensitive     ERTAPENEM <=0.12 SENSITIVE Sensitive     CEFTRIAXONE  <=0.25 SENSITIVE Sensitive     CIPROFLOXACIN  <=0.06 SENSITIVE Sensitive     GENTAMICIN <=1 SENSITIVE Sensitive     MEROPENEM <=0.25 SENSITIVE Sensitive     TRIMETH/SULFA <=20 SENSITIVE Sensitive     AMPICILLIN/SULBACTAM 16 INTERMEDIATE Intermediate     PIP/TAZO Value in next row Sensitive      <=4 SENSITIVEThis is a modified FDA-approved test that has been validated and  its performance characteristics determined by the reporting laboratory.  This laboratory is certified under the Clinical Laboratory Improvement Amendments CLIA as qualified to perform high complexity clinical laboratory testing.    * KLEBSIELLA PNEUMONIAE  Blood Culture ID Panel (Reflexed)     Status: Abnormal   Collection Time: 05/28/24  9:00 PM  Result Value Ref Range Status   Enterococcus faecalis NOT DETECTED NOT DETECTED Final    Comment: Performed at [facility], [address], [place], [place] [address]   Enterococcus Faecium NOT DETECTED NOT DETECTED Final    Comment: Performed at [name], [address] [place], [place] [address]   Listeria monocytogenes NOT DETECTED NOT DETECTED Final    Comment: Performed at [facility], [address], [place], [place] [address]   Staphylococcus species NOT DETECTED NOT DETECTED Final    Comment: Performed at [facility], [address], [place], [place] [address]   Staphylococcus aureus (BCID) NOT DETECTED NOT DETECTED Final    Comment: Performed at [facility], [address], [place], [place] [address]   Staphylococcus epidermidis NOT DETECTED NOT DETECTED Final    Comment: Performed at [facility], [address] [place], [place] [address]   Staphylococcus lugdunensis NOT DETECTED NOT DETECTED Final    Comment: Performed at [facility], [address], [place], [place] [address]   Streptococcus species NOT DETECTED NOT DETECTED Final    Comment: Performed at [facility], [address], [place], [place] [address]   Streptococcus agalactiae NOT DETECTED NOT DETECTED Final    Comment: Performed at [facility], [address], [place], [place] [address]   Streptococcus pneumoniae NOT DETECTED NOT DETECTED Final    Comment: Performed at [facility], [address], [place], [place] [address]   Streptococcus pyogenes NOT DETECTED NOT DETECTED Final    Comment: Performed  at [facility], [address], [place], [place] [address]   A.calcoaceticus-baumannii NOT DETECTED NOT DETECTED Final    Comment: Performed at [facility], [address], [place], [place] [address]   Bacteroides fragilis NOT DETECTED NOT DETECTED Final    Comment: Performed at [facility], [address], [place], [place] [address]   Enterobacterales DETECTED (A) NOT DETECTED Corrected    Comment: Enterobacterales represent a large order of gram  negative bacteria, not a single organism. CRITICAL RESULT CALLED TO, READ BACK BY AND VERIFIED WITH: MADISON HUNT PHARMD 1300 05/30/24 HNM Performed at [facility], [address] [place], [place] [address] CORRECTED ON 01/12 AT 1310: PREVIOUSLY REPORTED AS DETECTED Enterobacterales represent a large order of gram negative bacteria, not a single organism. CRITICAL RESULT CALLED TO, READ BACK BY AND VERIFIED WITH: PHARMD AMBER PAYNE [id number] AT 1248 BY EC    Enterobacter cloacae complex NOT DETECTED NOT DETECTED Final    Comment: Performed at [facility], [address], [place], [place] [address]   Escherichia coli NOT DETECTED NOT DETECTED Final    Comment: Performed at [facility], [address] [place], [place] [address]   Klebsiella aerogenes NOT DETECTED NOT DETECTED Final    Comment: Performed at [facility], [address], [place], [place] [address]   Klebsiella oxytoca NOT DETECTED NOT DETECTED Final    Comment: Performed at [facility], [address], [place], [place] [address]   Klebsiella pneumoniae DETECTED (A) NOT DETECTED Corrected    Comment: CRITICAL RESULT CALLED TO, READ BACK BY AND VERIFIED WITH: MADISON HUNT PHARMD 1300 05/30/24 HNM Performed at [facility], [address] [place], [place] [address] CORRECTED ON 01/12 AT 1310: PREVIOUSLY REPORTED AS DETECTED CRITICAL RESULT CALLED TO, READ BACK BY AND VERIFIED WITH: PHARMD AMBER PAYNE [id number] AT 1248 BY  EC    Proteus species NOT DETECTED NOT DETECTED Final   Salmonella species NOT DETECTED NOT DETECTED Final   Serratia marcescens NOT DETECTED NOT DETECTED Final   Haemophilus influenzae NOT DETECTED NOT DETECTED Final   Neisseria meningitidis NOT DETECTED NOT DETECTED Final   Pseudomonas aeruginosa NOT DETECTED NOT DETECTED Final   Stenotrophomonas maltophilia NOT DETECTED NOT DETECTED Final   Candida albicans NOT DETECTED NOT DETECTED Final   Candida auris NOT DETECTED NOT DETECTED Final   Candida glabrata NOT DETECTED NOT DETECTED Final   Candida krusei NOT DETECTED NOT DETECTED Final   Candida parapsilosis NOT DETECTED NOT DETECTED Final   Candida tropicalis NOT DETECTED NOT DETECTED Final   Cryptococcus neoformans/gattii NOT DETECTED NOT DETECTED Final   CTX-M ESBL NOT DETECTED NOT DETECTED Final   Carbapenem resistance IMP NOT DETECTED NOT DETECTED Final   Carbapenem resistance KPC NOT DETECTED NOT DETECTED Final   Carbapenem resistance NDM NOT DETECTED NOT DETECTED Final   Carbapenem resist OXA 48 LIKE NOT DETECTED NOT DETECTED Final   Carbapenem resistance VIM NOT DETECTED NOT DETECTED Final    Comment: Performed at [facility], [address], [place], [place] [address]  Culture, blood (Routine X 2) w Reflex to ID Panel     Status: None (Preliminary result)   Collection Time: 06/01/24  4:12 AM   Specimen: BLOOD LEFT ARM  Result Value Ref Range Status   Specimen Description BLOOD LEFT ARM  Final   Special Requests   Final    BOTTLES DRAWN AEROBIC AND ANAEROBIC Blood Culture adequate volume   Culture   Final    NO GROWTH < 24 HOURS Performed at [facility], [address], [place], [place] [address]    Report Status PENDING  Incomplete  Culture, blood (Routine X 2) w Reflex to ID Panel     Status: None (Preliminary result)   Collection Time: 06/01/24  4:20 AM   Specimen: BLOOD LEFT HAND  Result Value Ref Range Status   Specimen Description BLOOD LEFT  HAND  Final   Special Requests   Final    BOTTLES DRAWN AEROBIC AND ANAEROBIC Blood Culture adequate volume   Culture   Final    NO GROWTH < 24 HOURS Performed at [facility], [address] [place], [place] [address]    Report Status PENDING  Incomplete    Procedures and diagnostic studies:  No results found.             LOS: 7 days   Mayank Teuscher  [facility]   Pager on [email or web address]. If 7PM-7AM, please contact night-coverage at [email or web address]     06/02/2024, 5:10 PM           "

## 2024-06-03 ENCOUNTER — Other Ambulatory Visit (HOSPITAL_COMMUNITY): Payer: Self-pay

## 2024-06-03 ENCOUNTER — Other Ambulatory Visit: Payer: Self-pay

## 2024-06-03 LAB — GLUCOSE, CAPILLARY
Glucose-Capillary: 85 mg/dL (ref 70–99)
Glucose-Capillary: 100 mg/dL — ABNORMAL HIGH (ref 70–99)

## 2024-06-03 MED ORDER — SODIUM CHLORIDE FLUSH 0.9 % IV SOLN
10.0000 mL | Freq: Every day | INTRAVENOUS | 2 refills | Status: AC
  Filled 2024-06-03: qty 300, 30d supply, fill #0

## 2024-06-03 MED ORDER — LEVOFLOXACIN 750 MG PO TABS
750.0000 mg | ORAL_TABLET | Freq: Every day | ORAL | 0 refills | Status: AC
  Filled 2024-06-03: qty 7, 7d supply, fill #0

## 2024-06-03 MED ORDER — SODIUM CHLORIDE FLUSH 0.9 % IV SOLN
10.0000 mL | Freq: Every day | INTRAVENOUS | 2 refills | Status: DC
  Filled 2024-06-03: qty 300, 30d supply, fill #0

## 2024-06-03 MED ORDER — LEVOFLOXACIN 750 MG PO TABS
750.0000 mg | ORAL_TABLET | Freq: Every day | ORAL | Status: DC
  Administered 2024-06-03: 750 mg via ORAL
  Filled 2024-06-03: qty 1

## 2024-06-03 NOTE — Plan of Care (Signed)
   Problem: Education: Goal: Ability to describe self-care measures that may prevent or decrease complications (Diabetes Survival Skills Education) will improve Outcome: Progressing

## 2024-06-03 NOTE — TOC Progression Note (Addendum)
 Transition of Care [facility]) - Progression Note    Patient Details  Name: Jeffery Sweeney MRN: [id number] Date of Birth: [birth year]  Transition of Care [facility]) CM/SW Contact  Daved JONETTA Hamilton, RN Phone Number: 06/03/2024, 10:18 AM  Clinical Narrative:     Met with patient, introduced self and explained role. [name], [name] in room providing drain care education/teaching, provided supplies and reminded of prescriptions that have been sent to the outpatient pharmacy.   Patient verbalized that he no longer wants to go to rehab and states he feels safe to go home. When this CM discussed option of HH, patient advised that where he lives (there are other individuals in the home) it is preferred to not have visitors. Patient explained one of the people he lives with has HH and they have to go outside during the sessions and it is too cold for him to do that. Patient states he has a rolling walker and BSC at home that he can/will use if needed. Patient verbalized that a friend will be picking him up today but he hasn't called for them at this time as he is waiting on clothes from us  (scrubs/sweats) because he can not wear the clothes he came in with home. I encouraged patient to go ahead and call for his transport as he may be transported to the discharge lounge to await his transportation. Patient verbalized he will call once he gets the clothes.      Expected Discharge Plan: Skilled Nursing Facility Barriers to Discharge: Continued Medical Work up               Expected Discharge Plan and Services In-house Referral: Clinical Social Work Discharge Planning Services: CM Consult   Living arrangements for the past 2 months: Boarding House Expected Discharge Date: 06/03/24                                     Social Drivers of Health (SDOH) Interventions SDOH Screenings   Food Insecurity: Food Insecurity Present (05/25/2024)  Housing: Low Risk (05/25/2024)  Transportation Needs: No  Transportation Needs (05/25/2024)  Utilities: Not At Risk (05/25/2024)  Depression (PHQ2-9): Low Risk (05/04/2024)  Social Connections: Patient Declined (04/28/2024)  Tobacco Use: High Risk (05/25/2024)    Readmission Risk Interventions    05/30/2024    4:15 PM  Readmission Risk Prevention Plan  Transportation Screening Complete  PCP or Specialist Appt within 5-7 Days --  Home Care Screening Complete  Medication Review (RN CM) Complete

## 2024-06-03 NOTE — Progress Notes (Signed)
 "   Referring Provider(s): Dr. Rogelia Copping  Supervising Physician: Karalee Beat  Patient Status:  [facility] - In-pt  Chief Complaint: Calculus of bile duct without cholangitis s/p PTCD on 1/9   Brief History:  Patient presented to ED on 1/6 with complaints of worsening upper abdominal pain. Workup revealed elevated lipase with transaminitis, and patient was admitted for management of gallstone pancreatitis. MRCP on 1/7 demonstrated minimal dilatation of the CBD with abnormality in the distal CBD most consistent with a small distal CBD stone. GI was consulted and patient underwent ERCP with Dr. Copping. Unfortunately, the bile duct was unable to be cannulated, but a small stone was noted to pass out of the ampulla. IR was consulted and placed a PTC/ biliary drain in anticipation of hopeful stent placement with Dr. Copping. Following placement, the biliary drain output was blood-laden, with a concurrent downtrend in H&H from 13.1 on 1/9 to 10.6 on 1/12. Hgb ultimately stabilized and output became frank bile. Patient recovering well and plans for surgery and GI follow up in February.  Subjective:  Patient resting in bed in no acute distress. Plans for Surgery and GI follow up in February. Reports no abdominal pain, fevers/chills or nausea/vomiting.  Allergies: Patient has no known allergies.  Medications: Prior to Admission medications  Medication Sig Start Date End Date Taking? Authorizing Provider  acetaminophen  (TYLENOL ) 500 MG tablet Take 2 tablets (1,000 mg total) by mouth every 6 (six) hours as needed. 10/15/23 10/14/24 Yes Clarine Ozell LABOR, MD  aspirin  81 MG chewable tablet Chew 1 tablet (81 mg total) by mouth daily. 02/23/24  Yes Towana Small, FNP  clopidogrel  (PLAVIX ) 75 MG tablet Take 1 tablet (75 mg total) by mouth daily. 02/15/24  Yes Darron Deatrice LABOR, MD  isosorbide  mononitrate (IMDUR ) 30 MG 24 hr tablet Take 2 tablets (60 mg total) by mouth 2 (two) times daily. 02/23/24 05/25/24 Yes  Towana Small, FNP  meclizine  (ANTIVERT ) 25 MG tablet Take 2 tablets (50 mg total) by mouth 2 (two) times daily as needed for dizziness. 02/23/24  Yes Towana Small, FNP  metFORMIN  (GLUCOPHAGE -XR) 500 MG 24 hr tablet Take 2 tablets (1,000 mg total) by mouth 2 (two) times daily with a meal. 10/20/23  Yes Towana Small, FNP  metoprolol  tartrate (LOPRESSOR ) 50 MG tablet Take 1 tablet (50 mg total) by mouth 2 (two) times daily. 02/23/24  Yes Towana Small, FNP  nitroGLYCERIN  (NITROSTAT ) 0.4 MG SL tablet Place 1 tablet (0.4 mg total) under the tongue every 5 (five) minutes as needed for chest pain. 09/26/21  Yes Hammock, Sheri, NP  pantoprazole  (PROTONIX ) 20 MG tablet Take 2 tablets (40 mg total) by mouth daily. 02/23/24  Yes Butler, Kristina, FNP  rosuvastatin  (CRESTOR ) 40 MG tablet Take 1 tablet by mouth once daily 02/23/24  Yes Butler, Kristina, FNP  traZODone  (DESYREL ) 50 MG tablet Take 0.5-1 tablets (25-50 mg total) by mouth at bedtime as needed for sleep. 02/23/24  Yes Towana Small, FNP  albuterol  (VENTOLIN  HFA) 108 (90 Base) MCG/ACT inhaler Inhale 2 puffs into the lungs every 6 (six) hours as needed for wheezing or shortness of breath. Patient not taking: Reported on 05/25/2024 06/16/23   Towana Small, FNP  levofloxacin  (LEVAQUIN ) 750 MG tablet Take 1 tablet (750 mg total) by mouth daily for 7 doses. 06/04/24 06/11/24  Jens Durand, MD  sodium chloride  flush 0.9 % SOLN injection 5 mLs by Intracatheter route daily. 05/02/24 07/01/24  Piscoya, Jose, MD  sodium chloride  flush 0.9 % SOLN injection 5  mLs by Intracatheter route daily. Flush cholecystostomy tube daily with 5 ccs NS 05/02/24 07/01/24  Schulz, Zachary R, PA-C  sodium chloride  flush 0.9 % SOLN injection 10 mLs by Intracatheter route daily. Flush EACH catheter with 5-68mL daily 06/03/24   Kaslyn Richburg M, PA-C    Vital Signs: BP 133/83 (BP Location: Left Arm)   Pulse 76   Temp 98.4 F (36.9 C) (Oral)   Resp 18   Ht 5' 3 (1.6  m)   Wt 178 lb 9.2 oz (81 kg)   SpO2 93%   BMI 31.63 kg/m    Physical Exam Vitals reviewed.  Constitutional:      Appearance: Normal appearance.  Cardiovascular:     Rate and Rhythm: Normal rate.  Pulmonary:     Effort: Pulmonary effort is normal.  Abdominal:     General: Abdomen is flat.     Palpations: Abdomen is soft.     Tenderness: There is no abdominal tenderness.     Comments: RUQ chole drain sutured and stat locked in place. Overlying bandage is clean/dry. Insertion site is non-erythematous and non-tender to palpation. Flushes easily. Around 20mL in bag on exam RUQ biliary drain sutured and stat locked in place. Overlying bandage is clean/dry. Insertion site is non-erythematous and non-tender to palpation. Flushes easily. Around in bag on exam   Skin:    General: Skin is warm and dry.  Neurological:     Mental Status: He is alert and oriented to person, place, and time.     Drain Location: RUQ Size: Fr size: 10 Fr Chole drain and 8Fr biliary drain Date of placement: 05/27/24  Currently to: Drain collection device: gravity 24 hour output:  Output by Drain (mL) 06/01/24 0701 - 06/01/24 1900 06/01/24 1901 - 06/02/24 0700 06/02/24 0701 - 06/02/24 1900 06/02/24 1901 - 06/03/24 0700 06/03/24 0701 - 06/03/24 1653  Biliary Tube VTCB biliary 8 Fr. RUQ 270 350 400 120   Biliary Tube Cholecystostomy 10 Fr. RUQ 10  20      Interval imaging/drain manipulation:  None  Current examination: Flushes/aspirates easily.  Insertion site unremarkable. Suture and stat lock in place. Dressed appropriately.    Labs:  CBC: Recent Labs    05/29/24 0448 05/30/24 0423 05/31/24 1015 06/01/24 0412  WBC 11.8* 9.8 8.7 9.2  HGB 11.0* 10.6* 12.9* 12.1*  HCT 32.8* 31.9* 38.1* 35.6*  PLT 135* 114* 145* 155    COAGS: Recent Labs    04/28/24 1656 05/26/24 1557 05/28/24 2027  INR 1.1 1.1 1.2  APTT  --   --  34    BMP: Recent Labs    05/28/24 2027 05/29/24 0448  05/30/24 0423 05/31/24 1015  NA 135 134* 137 135  K 3.5 3.7 3.7 3.7  CL 101 105 107 102  CO2 21* 19* 20* 22  GLUCOSE 114* 135* 93 106*  BUN 16 16 14 11   CALCIUM  8.6* 8.3* 7.8* 8.3*  CREATININE 1.14 0.92 0.69 0.72  GFRNONAA >60 >60 >60 >60    LIVER FUNCTION TESTS: Recent Labs    05/27/24 1325 05/28/24 1106 05/28/24 2027 05/29/24 0448  BILITOT 1.4* 1.0 1.4* 1.2  AST 67* 25 26 37  ALT 88* 56* 50* 44  ALKPHOS 149* 120 152* 98  PROT 7.1 7.2 7.1 6.4*  ALBUMIN  3.5 3.4* 3.4* 3.0*    Assessment and Plan:  Calculus of Bile Duct without Cholangitis: Jeffery Sweeney is a 64 y.o. male with a history of acute cholecystitis s/p percutaneous  cholecystostomy in IR on 12/12 with Dr. Hughes. Patient presented to the ED on 1/6 with worsening abdominal pain. Workup concerning for possible gallstone pancreatitis. Patient underwent ERCP with Dr. Jinny who was unable to cannulate the bile duct, but was able to dislodge a small stone. He underwent additional biliary drain placement in IR with Dr. Karalee on 05/27/24  -Patient to be discharged today  -Denies any concerns with his drains at this time -Patient provided drain education at the bedside as well as additional supplies to take home -Saline Flushes can be picked up at the [facility] -Outpatient follow up order with IR in place; IR scheduler will contact patient with date/time of appointment. -Patient reports additional follow ups with Surgery and GI as well Per previous notes, patient to undergo possible stent placement with Dr. Jinny in 4-6 weeks. Both drains to remain in place until this procedure -Patient verbalized understanding. All questions and concerns answered at the bedside.    Thank you for allowing our service to participate in Jeffery Sweeney 's care.   Electronically Signed: Rachal Dvorsky M Dannika Hilgeman, PA-C 06/03/2024, 1:57 PM    I spent a total of 25 Minutes at the the patient's bedside AND on the  patient's hospital floor or unit, greater than 50% of which was counseling/coordinating care for drain care follow up. "

## 2024-06-03 NOTE — TOC Transition Note (Signed)
 Transition of Care [facility]) - Discharge Note   Patient Details  Name: Jeffery Sweeney MRN: [id number] Date of Birth: [birth year]  [facility] CM/SW Contact:  Daved JONETTA Hamilton, RN Phone Number: 06/03/2024, 10:25 AM   Clinical Narrative:     Patient will DC to: Home with self care Anticipated DC date: 06/03/24 Family notified: Patient to notify Transport by: patient's friend  TOC signing off.   Final next level of care: Home/Self Care Barriers to Discharge: Barriers Resolved   Patient Goals and CMS Choice Patient states their goals for this hospitalization and ongoing recovery are:: He just wants to go home          Discharge Placement                  Name of family member notified: Patient stated a friend is coming once he calls them Patient and family notified of of transfer: 06/03/24  Discharge Plan and Services Additional resources added to the After Visit Summary for   In-house Referral: Clinical Social Work Discharge Planning Services: CM Consult                                 Social Drivers of Health (SDOH) Interventions SDOH Screenings   Food Insecurity: Food Insecurity Present (05/25/2024)  Housing: Low Risk (05/25/2024)  Transportation Needs: No Transportation Needs (05/25/2024)  Utilities: Not At Risk (05/25/2024)  Depression (PHQ2-9): Low Risk (05/04/2024)  Social Connections: Patient Declined (04/28/2024)  Tobacco Use: High Risk (05/25/2024)     Readmission Risk Interventions    05/30/2024    4:15 PM  Readmission Risk Prevention Plan  Transportation Screening Complete  PCP or Specialist Appt within 5-7 Days --  Home Care Screening Complete  Medication Review (RN CM) Complete

## 2024-06-03 NOTE — Discharge Instructions (Signed)
 Interventional Radiology Percutaneous Drain Placement After Care   This sheet gives you information about how to care for yourself after your procedure. Your health care provider may also give you more specific instructions. Your drain was placed by an interventional radiologist with [facility]. If you have questions or concerns, contact [facility] at [phone].   What is a percutaneous drain?   A drain is a small plastic tube (catheter) that goes into the fluid collection in your body through your skin.   How long will I need the drain?   How long the drain needs to stay in is determined by where the drain is, how much comes out of the drain each day and if you are having any other surgical procedures.   Interventional radiology will determine when it is time to remove the drain. It is important to follow up as directed so that the drain can be removed as soon as it is safe to do so.   What can I expect after the procedure?   After the procedure, it is common to have:   A small amount of bruising and discomfort in the area where the drainage tube (catheter) was placed.   Sleepiness and fatigue. This should go away after the medicines you were given have worn off.   Follow these instructions at home:   Insertion site care   Check your insertion site when you change the bandage. Check for:   More redness, swelling, or pain.   More fluid or blood.   Warmth.   Pus or a bad smell.   When caring for your insertion site:   Wash your hands with soap and water for at least 20 seconds before and after you change your bandage (dressing). If soap and water are not available, use hand sanitizer.   You do not need to change your dressing everyday if it is clean and dry. Change your dressing every 3 days or as needed when it is soiled, wet or becoming dislodged. You will need to change your dressing each time you shower.   Leave stitches (sutures), skin glue, or  adhesive strips in place. These skin closures may need to stay in place for 2 weeks or longer. If adhesive strip edges start to loosen and curl up, you may trim the loose edges. Do not remove adhesive strips completely unless your health care provider tells you to do so.   Catheter care   Flush each catheter once per day with 5 mL of 0.9% normal saline unless you are told otherwise by your healthcare provider. This helps to prevent clogs in the catheter.   To disconnect the drains, turn the clear plastic tube to the left. Attach the saline syringe by placing it on the white end of the drain and turning gently to the right. Once attached gently push the plunger to the 5 mL mark. After you are done flushing, disconnect the syringe by turning to the left and reattach your drainage container   If you have a bulb please be sure the bulb is charged after reconnecting it - to do this pinch the bulb between your thumb and first finger and close the stopper located on the top of the bulb.    Check for fluid leaking from around your catheter (instead of fluid draining through your catheter). This may be a sign that the drain is no longer working correctly.   Write down the following information every time you empty your bag:  The date and time.   The amount of drainage.   Activity   Rest at home for 1-2 days after your procedure.   For the first 48 hours do not lift anything more than 10 lbs (about a gallon of milk). You may perform moderate activities/exercise. Please avoid strenuous activities during this time.   Avoid any activities which may pull on your drain as this can cause your drain to become dislodged.   If you were given a sedative during the procedure, it can affect you for several hours. Do not drive or operate machinery until your health care provider says that it is safe.   General instructions   For mild pain take over-the-counter medications as needed for pain such as Tylenol   or Advil. If you are experiencing severe pain please call our office as this may indicate an issue with your drain.    If you were prescribed an antibiotic medicine, take it as told by your health care provider. Do not stop using the antibiotic even if you start to feel better.   You may shower 24 hours after the drain is placed. To do this cover the insertion site with a water tight material such as saran wrap and seal the edges with tape, you may also purchase waterproof dressings at your local drug store. Shower as usual and then remove the water tight dressing and any gauze/tape underneath it once you have exited the shower and dried off. Allow the area to air dry or pat dry with a clean towel. Once the skin is completely dry place a new gauze dressing. It is important to keep the site dry at all times to prevent infection.   Do not submerge the drain - this means you cannot take baths, swim, use a hot tub, etc. until the drain is removed.    Do not use any products that contain nicotine  or tobacco, such as cigarettes, e-cigarettes, and chewing tobacco. If you need help quitting, ask your health care provider.   Keep all follow-up visits as told by your health care provider. This is important.   Contact a health care provider if:   You have any of these signs of infection:   More redness, swelling, or pain around your incision area.   More fluid or blood coming from your incision area.   Warmth coming from your incision area.   Pus or a bad smell coming from your incision area.   You have fluid leaking from around your catheter (instead of through your catheter).   You are unable to flush the drain.   You have a fever or chills.   You have pain that does not get better with medicine.   You have not been contacted to schedule a drain follow up appointment within 10 days of discharge from the hospital.   Please call [facility] at [phone] with any questions or  concerns.   Get help right away if:   Your catheter comes out.   You suddenly stop having drainage from your catheter.   You suddenly have blood in the fluid that is draining from your catheter.   You become dizzy or you faint.   You develop a rash.   You have nausea or vomiting.   You have difficulty breathing or you feel short of breath.   You develop chest pain.   You have problems with your speech or vision.   You have trouble balancing or moving your arms or  legs.   Summary   It is common to have a small amount of bruising and discomfort in the area where the drainage tube (catheter) was placed. You may also have minor discomfort with movement while the drain is in place.   Flush each drain once per day with 5 mL of 0.9% normal saline (unless you were told otherwise by your healthcare provider).    Record the amount of drainage from the bag every time you empty it.   Change the dressing every 3 days or earlier if soiled/wet. Keep the skin dry under the dressing.   You may shower with the drains in place. Do not submerge the drain (no baths, swimming, hot tubs, etc.).   Contact [facility] at [phone] if you have more redness, swelling, or pain around your incision area or if you have pain that does not get better with medicine.   This information is not intended to replace advice given to you by your health care provider. Make sure you discuss any questions you have with your health care provider.   Document Revised: 08/08/2021 Document Reviewed: 04/30/2019   Elsevier Patient Education  2023 [employer]     Interventional Radiology Drain Record   Empty your drain at least once per day. You may empty it as often as needed. Use this form to write down the amount of fluid that has collected in the drainage container. Bring this form with you to your follow-up visits. Please call [facility] at [phone] with any questions or concerns  prior to your appointment.   Drain #1 location: ___________________   Date __________ Time __________ Amount __________   Date __________ Time __________ Amount __________   Date __________ Time __________ Amount __________   Date __________ Time __________ Amount __________   Date __________ Time __________ Amount __________   Date __________ Time __________ Amount __________   Date __________ Time __________ Amount __________   Date __________ Time __________ Amount __________   Date __________ Time __________ Amount __________   Date __________ Time __________ Amount __________   Date __________ Time __________ Amount __________   Date __________ Time __________ Amount __________   Date __________ Time __________ Amount __________   Date __________ Time __________ Amount __________     Robie #2 location: ___________________   Date __________ Time __________ Amount __________   Date __________ Time __________ Amount __________   Date __________ Time __________ Amount __________   Date __________ Time __________ Amount __________   Date __________ Time __________ Amount __________   Date __________ Time __________ Amount __________   Date __________ Time __________ Amount __________   Date __________ Time __________ Amount __________   Date __________ Time __________ Amount __________   Date __________ Time __________ Amount __________   Date __________ Time __________ Amount __________   Date __________ Time __________ Amount __________   Date __________ Time __________ Amount __________   Date __________ Time __________ Amount __________

## 2024-06-03 NOTE — Discharge Summary (Signed)
 " Physician Discharge Summary   Patient: Jeffery Sweeney MRN: [id number] DOB: [birth year]  Admit date:     05/24/2024  Discharge date: 06/03/24  Discharge Physician: AIDA CHO   PCP: Towana Small, FNP   Recommendations at discharge:   Follow-up with Dr. Desiderio, general surgeon, on 06/22/2024 Follow-up with PCP in 1 week  Discharge Diagnoses: Principal Problem:   Acute gallstone pancreatitis Active Problems:   History of acute cholecystitis s/p percutaneous insertion of cholecystostomy tube 04/29/2024   CAD with history of CABG/Stent   Common bile duct stone   Diabetes mellitus without complication (HCC)   Essential hypertension   History of CVA (cerebrovascular accident)   Ascending cholangitis (HCC)   Bacteremia due to Klebsiella pneumoniae   Acute pancreatitis  Resolved Problems:   * No resolved hospital problems. *  Hospital Course:  Jeffery Sweeney is a 64 y.o. male with medical history significant for Prior CVA, hypertension, coronary artery disease, status post CABG and PCI and stent, , recently hospitalized (12/10-12/16/25) for acute cholecystitis, undergoing cholecystostomy on 04/29/2024, discharged home on Augmentin . He presented to the ED again on 05/24/2024 with upper abdominal pain.   Labs significant for lipase 1,518, AST 504, ALT 201, ALP 130, bilirubin 1.7     CT abdomen and pelvis IMPRESSION: 1. No CT evidence of acute pancreatitis. 2. Mild gallbladder wall thickening and edema with cholecystostomy tube in place, without radiopaque stones or bile duct dilatation. 3. 2 mm stone in the lower pole of the left kidney, without hydronephrosis or hydroureter. 4. Sigmoid diverticulosis without evidence of acute diverticulitis.     MRI abdomen, MRCP IMPRESSION: 1. Minimal dilatation of the common bile duct (7 mm) with tiny signal abnormality in the distal common bile duct most consistent with a tiny distal CBC stone. 2. Cholecystostomy tube in  place with tip within the lumen of the gallbladder, which is fluid filled but nondistended, measuring 2 cm in diameter. 3. No pancreatic ductal dilatation or inflammation. No peripancreatic fluid collections.     He was admitted to the hospital for choledocholithiasis.          Assessment and Plan:   Ascending cholangitis, choledocholithiasis:  S/p ERCP in which the bile duct could not be cannulated but a small stone did come out with manipulation of the ampulla as per GI on 05/25/24. S/p percutaneous transhepatic cholangiogram w/ biliary drain placement on 05/26/2024 as per IR.       Klebsiella pneumoniae bacteremia, Aeromonas bacteremia: No growth on repeat blood culture from 06/01/2024 thus far.   Dr. Fayette, ID specialist, recommended Levaquin  at discharge through 06/10/2024. Repeat EKG on 06/02/2024 showed QTc interval of 456 and 441. (Previous EKG on 05/28/2021 showed prolonged QTc at 569). S/p treatment with IV cefepime .  Initial blood culture from 05/28/2024 showed Klebsiella pneumoniae and Aeromonas hydrophila. Previously treated with broad-spectrum antibiotics including IV vancomycin , Flagyl  and cefoxitin      Acute gallstone pancreatitis: s/p percutaneous cholecystostomy 04/29/2024 for acute cholecystitis.  Patient is scheduled for cholecystectomy in February 2026 per Dr. Desiderio, general surgeon.     Elevated liver enzymes: Resolved     Bed bugs infestation: S/p permethrin  5% treatment on 05/25/2024     Comorbidities include CAD s/p CABG and coronary stent, hypertension, type II DM (hemoglobin A1c 6.6), history of stroke     General weakness: PT recommended discharge to SNF.  However, he declined SNF.  He said he has a pet at home and he feels strong enough to go back  home.  His condition has improved and he is deemed stable for discharge to home today.         Consultants: ID specialist, interventional radiologist, gastroenterologist Procedures performed: ERCP  on 05/25/2024 Percutaneous transhepatic cholangiogram on 05/26/2024  Disposition: Home Diet recommendation:  Cardiac diet DISCHARGE MEDICATION: Allergies as of 06/03/2024   No Known Allergies      Medication List     TAKE these medications    acetaminophen  500 MG tablet Commonly known as: TYLENOL  Take 2 tablets (1,000 mg total) by mouth every 6 (six) hours as needed.   albuterol  108 (90 Base) MCG/ACT inhaler Commonly known as: VENTOLIN  HFA Inhale 2 puffs into the lungs every 6 (six) hours as needed for wheezing or shortness of breath.   aspirin  81 MG chewable tablet Chew 1 tablet (81 mg total) by mouth daily.   clopidogrel  75 MG tablet Commonly known as: PLAVIX  Take 1 tablet (75 mg total) by mouth daily.   isosorbide  mononitrate 30 MG 24 hr tablet Commonly known as: IMDUR  Take 2 tablets (60 mg total) by mouth 2 (two) times daily.   levofloxacin  750 MG tablet Commonly known as: LEVAQUIN  Take 1 tablet (750 mg total) by mouth daily for 7 doses. Start taking on: June 04, 2024   meclizine  25 MG tablet Commonly known as: ANTIVERT  Take 2 tablets (50 mg total) by mouth 2 (two) times daily as needed for dizziness.   metFORMIN  500 MG 24 hr tablet Commonly known as: GLUCOPHAGE -XR Take 2 tablets (1,000 mg total) by mouth 2 (two) times daily with a meal.   metoprolol  tartrate 50 MG tablet Commonly known as: LOPRESSOR  Take 1 tablet (50 mg total) by mouth 2 (two) times daily.   nitroGLYCERIN  0.4 MG SL tablet Commonly known as: NITROSTAT  Place 1 tablet (0.4 mg total) under the tongue every 5 (five) minutes as needed for chest pain.   pantoprazole  20 MG tablet Commonly known as: PROTONIX  Take 2 tablets (40 mg total) by mouth daily.   rosuvastatin  40 MG tablet Commonly known as: CRESTOR  Take 1 tablet by mouth once daily   sodium chloride  flush 0.9 % Soln injection 5 mLs by Intracatheter route daily.   sodium chloride  flush 0.9 % Soln injection 5 mLs by Intracatheter  route daily. Flush cholecystostomy tube daily with 5 ccs NS   traZODone  50 MG tablet Commonly known as: DESYREL  Take 0.5-1 tablets (25-50 mg total) by mouth at bedtime as needed for sleep.        Discharge Exam: Filed Weights   05/24/24 1833  Weight: 81 kg   GEN: NAD SKIN: Warm and dry EYES: No pallor or icterus ENT: MMM CV: RRR PULM: CTA B ABD: soft, ND, NT, +BS, right upper abdominal drain with dark greenish fluid CNS: AAO x 3, non focal EXT: No edema or tenderness    Condition at discharge: good  The results of significant diagnostics from this hospitalization (including imaging, microbiology, ancillary and laboratory) are listed below for reference.   Imaging Studies: DG Chest Port 1 View Result Date: 05/28/2024 CLINICAL DATA:  Pneumonia EXAM: PORTABLE CHEST 1 VIEW COMPARISON:  11/30/2023 FINDINGS: Single frontal view of the chest demonstrates an unremarkable cardiac silhouette. Postsurgical changes from CABG. No acute airspace disease, effusion, or pneumothorax. Lung volumes are diminished. No acute bony abnormalities. Biliary drain overlies right upper quadrant. IMPRESSION: 1. Low lung volumes.  No acute intrathoracic process. Electronically Signed   By: Ozell Daring M.D.   On: 05/28/2024 20:59   IR  EXCHANGE BILIARY DRAIN Result Date: 05/27/2024 INDICATION: 64 year old male with acute calculus cholecystitis status post percutaneous cholecystostomy tube placement on 04/29/2024 now with choledocholithiasis and status post unsuccessful ERCP. EXAM: 1. Attempted cholangiogram through existing percutaneous cholecystostomy tube 2. Exchange of percutaneous cholecystostomy tube 3. Transhepatic cholangiogram 4. Transhepatic percutaneous biliary drain placement MEDICATIONS: In patient already receiving intravenous antibiotics. ANESTHESIA/SEDATION: Moderate (conscious) sedation was employed during this procedure. A total of Versed  5 mg and Fentanyl  200 mcg was administered  intravenously by the radiology nurse. Total intra-service moderate Sedation Time: 54 minutes. The patient's level of consciousness and vital signs were monitored continuously by radiology nursing throughout the procedure under my direct supervision. FLUOROSCOPY: Radiation Exposure Index (as provided by the fluoroscopic device): 1,078 mGy Kerma COMPLICATIONS: None immediate. PROCEDURE: Informed written consent was obtained from the patient after a thorough discussion of the procedural risks, benefits and alternatives. All questions were addressed. Maximal Sterile Barrier Technique was utilized including caps, mask, sterile gowns, sterile gloves, sterile drape, hand hygiene and skin antiseptic. A timeout was performed prior to the initiation of the procedure. An initial contrast injection was performed through the patient's existing cholecystostomy tube. The gallbladder is partially decompressed. The gallbladder was filled with contrast. However, before the gallbladder even became distended, there was frank rupture of the superior aspect of the gallbladder neck likely due to pressurization from a completely impacted stone in the neck or proximal cystic duct. This resulted in extravasation of contrast material into the region of the porta hepatis. The cystic duct was never visualized. The common bile duct was not visualized. At this point, the patient was experiencing significant discomfort. Therefore does the decision was made to exchange the existing cholecystostomy tube to place it in a better location closer to the neck of the gallbladder to facilitate appropriate drainage. The existing tube was transected and carefully removed over a wire. The wire was manipulated into the gallbladder neck. A new 10 French all-purpose drainage catheter was advanced over the wire and formed. Aspiration yields return of bloody bile. The patient experience relief once the gallbladder was decompressed. This catheter was then secured  with 0 Prolene suture. Next attention was made to performing a standard percutaneous transhepatic cholangiogram. Local anesthesia was attained by infiltration with 1% lidocaine  at a spot along the mid axillary line. A 22 gauge Chiba needle was then advanced into the liver and slowly pulled back wall gently injecting contrast material. This maneuver was performed multiple times in till there was successful opacification of the biliary system. Transhepatic cholangiography demonstrates completely decompressed peripheral and intrahepatic biliary radicles but mild dilatation of the common bile duct. Filling defects are present in the distal common bile duct consistent with known choledocholithiasis. A second 22 gauge Chiba needle was then used to puncture a peripheral radicle in the right posterior ductal system. A 0.018 Nitrex wire was successfully advanced centrally into the common bile duct. The needle was removed and the transitional dilator was advanced over the wire and into the common bile duct. Next, a hydrophilic road runner wire was used to successfully cross the ampulla of Vater. The wire was then advanced into the horizontal duodenum. The transitional dilator was removed. The percutaneous tract was dilated to 8 French. An 8 French internal/external biliary drainage catheter was advanced over the wire and formed. The distal loop is within the duodenum. Contrast injection opacifies of the biliary tree and the duodenum consistent with appropriate internal/external placement. This catheter was connected to bag drainage and secured to the  skin with 0 Prolene suture. IMPRESSION: 1. Unsuccessful attempted cholangiogram through existing percutaneous cholecystostomy tube due to extremely friable gallbladder. With minimal pressure, there was frank extravasation of contrast material from the gallbladder neck due to complete occlusion of the gallbladder neck/proximal cystic duct. 2. Successful exchange for a new 10  French percutaneous cholecystostomy tube. 3. Successful transhepatic percutaneous transhepatic cholangiogram with placement of an 8 French internal/external biliary drainage catheter. PLAN: At some point in the coming weeks, Dr. Jinny plans to do a repeat ERCP in the hopes of successfully passing a wire next to the biliary drainage catheter to allow for sphincterotomy and balloon sweeping of the common duct. Once this has been successfully performed, the percutaneous biliary catheter can likely be removed. The percutaneous cholecystostomy tube will need to stay in place until the time of interval cholecystectomy. Electronically Signed   By: Wilkie Lent M.D.   On: 05/27/2024 11:38   IR INT EXT BILIARY DRAIN WITH CHOLANGIOGRAM Result Date: 05/27/2024 INDICATION: 64 year old male with acute calculus cholecystitis status post percutaneous cholecystostomy tube placement on 04/29/2024 now with choledocholithiasis and status post unsuccessful ERCP. EXAM: 1. Attempted cholangiogram through existing percutaneous cholecystostomy tube 2. Exchange of percutaneous cholecystostomy tube 3. Transhepatic cholangiogram 4. Transhepatic percutaneous biliary drain placement MEDICATIONS: In patient already receiving intravenous antibiotics. ANESTHESIA/SEDATION: Moderate (conscious) sedation was employed during this procedure. A total of Versed  5 mg and Fentanyl  200 mcg was administered intravenously by the radiology nurse. Total intra-service moderate Sedation Time: 54 minutes. The patient's level of consciousness and vital signs were monitored continuously by radiology nursing throughout the procedure under my direct supervision. FLUOROSCOPY: Radiation Exposure Index (as provided by the fluoroscopic device): 1,078 mGy Kerma COMPLICATIONS: None immediate. PROCEDURE: Informed written consent was obtained from the patient after a thorough discussion of the procedural risks, benefits and alternatives. All questions were addressed.  Maximal Sterile Barrier Technique was utilized including caps, mask, sterile gowns, sterile gloves, sterile drape, hand hygiene and skin antiseptic. A timeout was performed prior to the initiation of the procedure. An initial contrast injection was performed through the patient's existing cholecystostomy tube. The gallbladder is partially decompressed. The gallbladder was filled with contrast. However, before the gallbladder even became distended, there was frank rupture of the superior aspect of the gallbladder neck likely due to pressurization from a completely impacted stone in the neck or proximal cystic duct. This resulted in extravasation of contrast material into the region of the porta hepatis. The cystic duct was never visualized. The common bile duct was not visualized. At this point, the patient was experiencing significant discomfort. Therefore does the decision was made to exchange the existing cholecystostomy tube to place it in a better location closer to the neck of the gallbladder to facilitate appropriate drainage. The existing tube was transected and carefully removed over a wire. The wire was manipulated into the gallbladder neck. A new 10 French all-purpose drainage catheter was advanced over the wire and formed. Aspiration yields return of bloody bile. The patient experience relief once the gallbladder was decompressed. This catheter was then secured with 0 Prolene suture. Next attention was made to performing a standard percutaneous transhepatic cholangiogram. Local anesthesia was attained by infiltration with 1% lidocaine  at a spot along the mid axillary line. A 22 gauge Chiba needle was then advanced into the liver and slowly pulled back wall gently injecting contrast material. This maneuver was performed multiple times in till there was successful opacification of the biliary system. Transhepatic cholangiography demonstrates completely decompressed peripheral  and intrahepatic biliary  radicles but mild dilatation of the common bile duct. Filling defects are present in the distal common bile duct consistent with known choledocholithiasis. A second 22 gauge Chiba needle was then used to puncture a peripheral radicle in the right posterior ductal system. A 0.018 Nitrex wire was successfully advanced centrally into the common bile duct. The needle was removed and the transitional dilator was advanced over the wire and into the common bile duct. Next, a hydrophilic road runner wire was used to successfully cross the ampulla of Vater. The wire was then advanced into the horizontal duodenum. The transitional dilator was removed. The percutaneous tract was dilated to 8 French. An 8 French internal/external biliary drainage catheter was advanced over the wire and formed. The distal loop is within the duodenum. Contrast injection opacifies of the biliary tree and the duodenum consistent with appropriate internal/external placement. This catheter was connected to bag drainage and secured to the skin with 0 Prolene suture. IMPRESSION: 1. Unsuccessful attempted cholangiogram through existing percutaneous cholecystostomy tube due to extremely friable gallbladder. With minimal pressure, there was frank extravasation of contrast material from the gallbladder neck due to complete occlusion of the gallbladder neck/proximal cystic duct. 2. Successful exchange for a new 10 French percutaneous cholecystostomy tube. 3. Successful transhepatic percutaneous transhepatic cholangiogram with placement of an 8 French internal/external biliary drainage catheter. PLAN: At some point in the coming weeks, Dr. Jinny plans to do a repeat ERCP in the hopes of successfully passing a wire next to the biliary drainage catheter to allow for sphincterotomy and balloon sweeping of the common duct. Once this has been successfully performed, the percutaneous biliary catheter can likely be removed. The percutaneous cholecystostomy tube will  need to stay in place until the time of interval cholecystectomy. Electronically Signed   By: Wilkie Lent M.D.   On: 05/27/2024 11:38   DG C-Arm 1-60 Min-No Report Result Date: 05/25/2024 Fluoroscopy was utilized by the requesting physician.  No radiographic interpretation.   MR ABDOMEN MRCP W WO CONTRAST Result Date: 05/25/2024 EXAM: MRCP WITH AND WITHOUT IV CONTRAST 05/25/2024 09:06:41 AM TECHNIQUE: Multisequence, multiplanar magnetic resonance images of the abdomen with and without intravenous contrast. MRCP sequences were performed. 7.5 mL Gadavis was administered intravenously. COMPARISON: None available. CLINICAL HISTORY: Cholelithiasis. Concern for gallstone pancreatitis. Abdominal pain. Cholecystostomy tube placed 04/29/2024. FINDINGS: LIVER: Unremarkable. GALLBLADDER AND BILIARY SYSTEM: Cholecystostomy tube with tip within the lumen of the gallbladder. The gallbladder is fluid filled but nondistended measuring 2 cm in diameter. The common hepatic duct measures 7 mm. The common bile duct measures 7 mm, consistent with minimal dilatation. Tiny signal abnormality of the distal common bile duct is noted as a hyperintensity on diffusion weighted imaging just above the ampulla on image 52 series 11 and a hypointensity on T2 weighted imaging (image 20 series 4). There is some motion degradation on the MRCP sequences, With potential distal stone seen on image 14 of series 5. No intrahepatic biliary ductal dilatation. SPLEEN: Unremarkable. PANCREAS/PANCREATIC DUCT: Visualized pancreas is unremarkable. No pancreatic ductal dilatation. No pancreatic inflammation. No peripancreatic fluid collections. ADRENAL GLANDS: Unremarkable. KIDNEYS: Benign nonenhancing cysts of the left kidney. No follow up recommended. LYMPH NODES: No enlarged abdominal lymph nodes. VASCULATURE: Unremarkable. PERITONEUM: No ascites. ABDOMINAL WALL: No hernia. No mass. BOWEL: Grossly unremarkable. No bowel obstruction. BONES: No acute  abnormality or worrisome osseous lesion. SOFT TISSUES: Unremarkable. MISCELLANEOUS: Unremarkable. IMPRESSION: 1. Minimal dilatation of the common bile duct (7 mm) with tiny signal abnormality in  the distal common bile duct most consistent with a tiny distal CBC stone. 2. Cholecystostomy tube in place with tip within the lumen of the gallbladder, which is fluid filled but nondistended, measuring 2 cm in diameter. 3. No pancreatic ductal dilatation or inflammation. No peripancreatic fluid collections. Electronically signed by: Norleen Boxer MD 05/25/2024 09:38 AM EST RP Workstation: [id number]   MR 3D Recon At Scanner Result Date: 05/25/2024 EXAM: MRCP WITH AND WITHOUT IV CONTRAST 05/25/2024 09:06:41 AM TECHNIQUE: Multisequence, multiplanar magnetic resonance images of the abdomen with and without intravenous contrast. MRCP sequences were performed. 7.5 mL Gadavis was administered intravenously. COMPARISON: None available. CLINICAL HISTORY: Cholelithiasis. Concern for gallstone pancreatitis. Abdominal pain. Cholecystostomy tube placed 04/29/2024. FINDINGS: LIVER: Unremarkable. GALLBLADDER AND BILIARY SYSTEM: Cholecystostomy tube with tip within the lumen of the gallbladder. The gallbladder is fluid filled but nondistended measuring 2 cm in diameter. The common hepatic duct measures 7 mm. The common bile duct measures 7 mm, consistent with minimal dilatation. Tiny signal abnormality of the distal common bile duct is noted as a hyperintensity on diffusion weighted imaging just above the ampulla on image 52 series 11 and a hypointensity on T2 weighted imaging (image 20 series 4). There is some motion degradation on the MRCP sequences, With potential distal stone seen on image 14 of series 5. No intrahepatic biliary ductal dilatation. SPLEEN: Unremarkable. PANCREAS/PANCREATIC DUCT: Visualized pancreas is unremarkable. No pancreatic ductal dilatation. No pancreatic inflammation. No peripancreatic fluid collections.  ADRENAL GLANDS: Unremarkable. KIDNEYS: Benign nonenhancing cysts of the left kidney. No follow up recommended. LYMPH NODES: No enlarged abdominal lymph nodes. VASCULATURE: Unremarkable. PERITONEUM: No ascites. ABDOMINAL WALL: No hernia. No mass. BOWEL: Grossly unremarkable. No bowel obstruction. BONES: No acute abnormality or worrisome osseous lesion. SOFT TISSUES: Unremarkable. MISCELLANEOUS: Unremarkable. IMPRESSION: 1. Minimal dilatation of the common bile duct (7 mm) with tiny signal abnormality in the distal common bile duct most consistent with a tiny distal CBC stone. 2. Cholecystostomy tube in place with tip within the lumen of the gallbladder, which is fluid filled but nondistended, measuring 2 cm in diameter. 3. No pancreatic ductal dilatation or inflammation. No peripancreatic fluid collections. Electronically signed by: Norleen Boxer MD 05/25/2024 09:38 AM EST RP Workstation: [id number]   CT ABDOMEN PELVIS W CONTRAST Result Date: 05/24/2024 EXAM: CT ABDOMEN AND PELVIS WITH CONTRAST 05/24/2024 11:38:53 PM TECHNIQUE: CT of the abdomen and pelvis was performed with the administration of 100 mL of iohexol  (OMNIPAQUE ) 300 MG/ML solution. Multiplanar reformatted images are provided for review. Automated exposure control, iterative reconstruction, and/or weight-based adjustment of the mA/kV was utilized to reduce the radiation dose to as low as reasonably achievable. COMPARISON: Radiopaque biliary scan 04/28/2024, ultrasound abdomen 04/27/2024, CT abdomen and pelvis 06/09/2023. CLINICAL HISTORY: Abdominal pain, acute (Ped 0-17y); epigastric pain, pancreatitis by labs, chole tube in place. FINDINGS: LOWER CHEST: Atelectasis in the lung bases. LIVER: Diffuse fatty infiltration of the liver. GALLBLADDER AND BILE DUCTS: Cholecystostomy tube in place. Mild gallbladder wall thickening and edema. No radiopaque stones identified. No bile duct dilatation. SPLEEN: The spleen is unremarkable. PANCREAS: The pancreas is  unremarkable. ADRENAL GLANDS: The adrenal glands are unremarkable. KIDNEYS, URETERS AND BLADDER: 5.2 cm cyst on the left kidney, unchanged. No imaging follow-up is indicated. 2 mm stone in the lower pole of the left kidney. No hydronephrosis or hydroureter. No ureteral stones are seen. The bladder is normal. GI AND BOWEL: Diverticulosis of the sigmoid colon without evidence of acute diverticulitis. The stomach, small bowel, and colon are not abnormally distended.  No wall thickening or inflammatory stranding. The appendix is normal. PERITONEUM AND RETROPERITONEUM: No ascites. No free air. VASCULATURE: Aorta is normal in caliber. LYMPH NODES: No lymphadenopathy. REPRODUCTIVE ORGANS: The prostate gland is not enlarged. BONES AND SOFT TISSUES: Degenerative changes in the spine and hips. No acute osseous abnormality. No focal soft tissue abnormality. IMPRESSION: 1. No CT evidence of acute pancreatitis. 2. Mild gallbladder wall thickening and edema with cholecystostomy tube in place, without radiopaque stones or bile duct dilatation. 3. 2 mm stone in the lower pole of the left kidney, without hydronephrosis or hydroureter. 4. Sigmoid diverticulosis without evidence of acute diverticulitis. Electronically signed by: Elsie Gravely MD 05/24/2024 11:43 PM EST RP Workstation: [id number]    Microbiology: Results for orders placed or performed during the hospital encounter of 05/24/24  Culture, blood (Routine X 2) w Reflex to ID Panel     Status: None   Collection Time: 05/27/24  1:24 PM   Specimen: BLOOD  Result Value Ref Range Status   Specimen Description BLOOD BLOOD RIGHT ARM  Final   Special Requests   Final    BOTTLES DRAWN AEROBIC AND ANAEROBIC Blood Culture adequate volume   Culture   Final    NO GROWTH 5 DAYS Performed at [facility], [address], [place], [place] [address]    Report Status 06/01/2024 FINAL  Final  Culture, blood (Routine X 2) w Reflex to ID Panel     Status: None    Collection Time: 05/27/24  1:24 PM   Specimen: BLOOD  Result Value Ref Range Status   Specimen Description BLOOD BLOOD RIGHT HAND  Final   Special Requests   Final    BOTTLES DRAWN AEROBIC AND ANAEROBIC Blood Culture adequate volume   Culture   Final    NO GROWTH 5 DAYS Performed at [facility], [address], [place], [place] [address]    Report Status 06/01/2024 FINAL  Final  Susceptibility, Aer + Anaerob     Status: Abnormal   Collection Time: 05/28/24 12:00 PM  Result Value Ref Range Status   Suscept, Aer + Anaerob Preliminary report (A)  Final    Comment: (NOTE) Performed At: [facility] [address] [place], [place] [id number] Jennette Shorter MD Ey:[id number]    Source BLOOD/ AEROMONAS HYDROPHILA  Final    Comment: Performed at [facility], [address] [place], [place] [address]  Susceptibility Result     Status: Abnormal   Collection Time: 05/28/24 12:00 PM  Result Value Ref Range Status   Suscept Result 1 Aeromonas hydrophila (A)  Final    Comment: (NOTE) Identification performed by account, not confirmed by this laboratory. Performed At: [facility] [address] [place], [place] [id number] Jennette Shorter MD Ey:[id number]   Culture, blood (Routine X 2) w Reflex to ID Panel     Status: Abnormal (Preliminary result)   Collection Time: 05/28/24  8:58 PM   Specimen: BLOOD  Result Value Ref Range Status   Specimen Description   Final    BLOOD BLOOD RIGHT ARM Performed at [facility], [address], [place], [place] [address]    Special Requests   Final    BOTTLES DRAWN AEROBIC AND ANAEROBIC Blood Culture adequate volume Performed at [facility], [address] [place], [place] [address]    Culture  Setup Time   Final    GRAM NEGATIVE RODS IN BOTH AEROBIC AND ANAEROBIC BOTTLES CRITICAL RESULT CALLED TO, READ BACK BY  AND VERIFIED WITH: CATHALEEN BLANCH 05/29/24 0841 MW Performed at [facility],  [address] [place], [place] [address]    Culture (A)  Final    KLEBSIELLA PNEUMONIAE SUSCEPTIBILITIES PERFORMED ON PREVIOUS CULTURE WITHIN THE LAST 5 DAYS. AEROMONAS HYDROPHILA GROUP Sent to [facility] for further susceptibility testing. Performed at [facility], [address], [place], [place] [address]    Report Status PENDING  Incomplete  Culture, blood (Routine X 2) w Reflex to ID Panel     Status: Abnormal   Collection Time: 05/28/24  9:00 PM   Specimen: BLOOD  Result Value Ref Range Status   Specimen Description   Final    BLOOD BLOOD RIGHT HAND Performed at [facility], [address] [place], [place] [address]    Special Requests   Final    BOTTLES DRAWN AEROBIC AND ANAEROBIC Blood Culture adequate volume Performed at [facility], [address] [place], [place] [address]    Culture  Setup Time   Final    Organism ID to follow GRAM NEGATIVE RODS IN BOTH AEROBIC AND ANAEROBIC BOTTLES CRITICAL RESULT CALLED TO, READ BACK BY AND VERIFIED WITH: CATHALEEN PATEL 05/29/24 0841 MW CRITICAL RESULT CALLED TO, READ BACK BY AND VERIFIED WITH: RN AMBER PAYNE [id number] AT 1248 BY EC Performed at [facility], [address], [place], [place] [address]    Culture (A)  Final    KLEBSIELLA PNEUMONIAE AEROMONAS HYDROPHILA GROUP    Report Status 05/31/2024 FINAL  Final   Organism ID, Bacteria KLEBSIELLA PNEUMONIAE  Final      Susceptibility   Klebsiella pneumoniae - MIC*    AMPICILLIN >=32 RESISTANT Resistant     CEFAZOLIN (NON-URINE) 4 INTERMEDIATE Intermediate     CEFEPIME  <=0.12 SENSITIVE Sensitive     ERTAPENEM <=0.12 SENSITIVE Sensitive     CEFTRIAXONE  <=0.25 SENSITIVE Sensitive     CIPROFLOXACIN  <=0.06 SENSITIVE Sensitive     GENTAMICIN <=1 SENSITIVE Sensitive     MEROPENEM <=0.25 SENSITIVE Sensitive     TRIMETH/SULFA <=20 SENSITIVE Sensitive     AMPICILLIN/SULBACTAM 16 INTERMEDIATE Intermediate     PIP/TAZO Value in next row Sensitive       <=4 SENSITIVEThis is a modified FDA-approved test that has been validated and its performance characteristics determined by the reporting laboratory.  This laboratory is certified under the Clinical Laboratory Improvement Amendments CLIA as qualified to perform high complexity clinical laboratory testing.    * KLEBSIELLA PNEUMONIAE  Blood Culture ID Panel (Reflexed)     Status: Abnormal   Collection Time: 05/28/24  9:00 PM  Result Value Ref Range Status   Enterococcus faecalis NOT DETECTED NOT DETECTED Final    Comment: Performed at [facility], [address] [place], [place] [address]   Enterococcus Faecium NOT DETECTED NOT DETECTED Final    Comment: Performed at [facility], [address], [place], [place] [address]   Listeria monocytogenes NOT DETECTED NOT DETECTED Final    Comment: Performed at [facility], [address], [place], [place] [address]   Staphylococcus species NOT DETECTED NOT DETECTED Final    Comment: Performed at [facility], [address], [place], [place] [address]   Staphylococcus aureus (BCID) NOT DETECTED NOT DETECTED Final    Comment: Performed at [facility], [address] [place], [place] [address]   Staphylococcus epidermidis NOT DETECTED NOT DETECTED Final    Comment: Performed at [facility], [address], [place], [place] [address]   Staphylococcus lugdunensis NOT DETECTED NOT DETECTED Final    Comment: Performed at [facility], [address] [place], [place] [address]   Streptococcus species NOT DETECTED NOT DETECTED Final    Comment: Performed at [facility], [address], [place], [place] [address]   Streptococcus agalactiae NOT DETECTED NOT DETECTED Final    Comment: Performed at [facility], [address] [place], [place] [address]   Streptococcus pneumoniae NOT DETECTED NOT DETECTED Final    Comment: Performed at [facility], [address], [place], [place]  [address]   Streptococcus pyogenes NOT DETECTED NOT DETECTED Final    Comment: Performed at [facility], [address] [place], [place] [address]   A.calcoaceticus-baumannii NOT DETECTED NOT DETECTED Final    Comment: Performed at [facility], [address], [place], [place] [address]   Bacteroides fragilis NOT DETECTED NOT DETECTED Final    Comment: Performed at [facility], [address], [place], [place] [address]   Enterobacterales DETECTED (A) NOT DETECTED Corrected    Comment: Enterobacterales represent a large order of gram negative bacteria, not a single organism. CRITICAL RESULT CALLED TO, READ BACK BY AND VERIFIED WITH: MADISON HUNT PHARMD 1300 05/30/24 HNM Performed at [facility], [address] [place], [place] [address] CORRECTED ON 01/12 AT 1310: PREVIOUSLY REPORTED AS DETECTED Enterobacterales represent a large order of gram negative bacteria, not a single organism. CRITICAL RESULT CALLED TO, READ BACK BY AND VERIFIED WITH: PHARMD AMBER PAYNE [id number] AT 1248 BY EC    Enterobacter cloacae complex NOT DETECTED NOT DETECTED Final    Comment: Performed at [facility], [address], [place], [place] [address]   Escherichia coli NOT DETECTED NOT DETECTED Final    Comment: Performed at [facility], [address], [place], [place] [address]   Klebsiella aerogenes NOT DETECTED NOT DETECTED Final    Comment: Performed at [facility], [address] [place], [place] [address]   Klebsiella oxytoca NOT DETECTED NOT DETECTED Final    Comment: Performed at [facility], [address] [place], [place] [address]   Klebsiella pneumoniae DETECTED (A) NOT DETECTED Corrected    Comment: CRITICAL RESULT CALLED TO, READ BACK BY AND VERIFIED WITH: MADISON HUNT PHARMD 1300 05/30/24 HNM Performed at [facility], [address] [place], [place] [address] CORRECTED ON 01/12 AT 1310: PREVIOUSLY REPORTED AS DETECTED CRITICAL  RESULT CALLED TO, READ BACK BY AND VERIFIED WITH: PHARMD AMBER PAYNE [id number] AT 1248 BY EC    Proteus species NOT DETECTED NOT DETECTED Final   Salmonella species NOT DETECTED NOT DETECTED Final   Serratia marcescens NOT DETECTED NOT DETECTED Final   Haemophilus influenzae NOT DETECTED NOT DETECTED Final   Neisseria meningitidis NOT DETECTED NOT DETECTED Final   Pseudomonas aeruginosa NOT DETECTED NOT DETECTED Final   Stenotrophomonas maltophilia NOT DETECTED NOT DETECTED Final   Candida albicans NOT DETECTED NOT DETECTED Final   Candida auris NOT DETECTED NOT DETECTED Final   Candida glabrata NOT DETECTED NOT DETECTED Final   Candida krusei NOT DETECTED NOT DETECTED Final   Candida parapsilosis NOT DETECTED NOT DETECTED Final   Candida tropicalis NOT DETECTED NOT DETECTED Final   Cryptococcus neoformans/gattii NOT DETECTED NOT DETECTED Final   CTX-M ESBL NOT DETECTED NOT DETECTED Final   Carbapenem resistance  IMP NOT DETECTED NOT DETECTED Final   Carbapenem resistance KPC NOT DETECTED NOT DETECTED Final   Carbapenem resistance NDM NOT DETECTED NOT DETECTED Final   Carbapenem resist OXA 48 LIKE NOT DETECTED NOT DETECTED Final   Carbapenem resistance VIM NOT DETECTED NOT DETECTED Final    Comment: Performed at [facility], [address], [place], [place] [address]  Culture, blood (Routine X 2) w Reflex to ID Panel     Status: None (Preliminary result)   Collection Time: 06/01/24  4:12 AM   Specimen: BLOOD LEFT ARM  Result Value Ref Range Status   Specimen Description BLOOD LEFT ARM  Final   Special Requests   Final    BOTTLES DRAWN AEROBIC AND ANAEROBIC Blood Culture adequate volume   Culture   Final    NO GROWTH 2 DAYS Performed at [facility], [address] [place], [place] [address]    Report Status PENDING  Incomplete  Culture, blood (Routine X 2) w Reflex to ID Panel     Status: None (Preliminary result)   Collection Time: 06/01/24  4:20 AM   Specimen:  BLOOD LEFT HAND  Result Value Ref Range Status   Specimen Description BLOOD LEFT HAND  Final   Special Requests   Final    BOTTLES DRAWN AEROBIC AND ANAEROBIC Blood Culture adequate volume   Culture   Final    NO GROWTH 2 DAYS Performed at [facility], [address] [place], [place] [address]    Report Status PENDING  Incomplete    Labs: CBC: Recent Labs  Lab 05/28/24 2027 05/29/24 0448 05/30/24 0423 05/31/24 1015 06/01/24 0412  WBC 2.3* 11.8* 9.8 8.7 9.2  NEUTROABS 1.8  --   --   --   --   HGB 12.2* 11.0* 10.6* 12.9* 12.1*  HCT 36.3* 32.8* 31.9* 38.1* 35.6*  MCV 86.8 87.7 87.9 85.6 86.2  PLT 140* 135* 114* 145* 155   Basic Metabolic Panel: Recent Labs  Lab 05/28/24 1106 05/28/24 2027 05/29/24 0448 05/30/24 0423 05/31/24 1015  NA 136 135 134* 137 135  K 3.8 3.5 3.7 3.7 3.7  CL 102 101 105 107 102  CO2 23 21* 19* 20* 22  GLUCOSE 122* 114* 135* 93 106*  BUN 12 16 16 14 11   CREATININE 0.90 1.14 0.92 0.69 0.72  CALCIUM  8.7* 8.6* 8.3* 7.8* 8.3*  MG  --  1.5*  --   --   --    Liver Function Tests: Recent Labs  Lab 05/27/24 1325 05/28/24 1106 05/28/24 2027 05/29/24 0448  AST 67* 25 26 37  ALT 88* 56* 50* 44  ALKPHOS 149* 120 152* 98  BILITOT 1.4* 1.0 1.4* 1.2  PROT 7.1 7.2 7.1 6.4*  ALBUMIN  3.5 3.4* 3.4* 3.0*   CBG: Recent Labs  Lab 06/02/24 0755 06/02/24 1218 06/02/24 1728 06/03/24 0625 06/03/24 0743  GLUCAP 95 104* 96 85 100*    Discharge time spent: greater than 30 minutes.  Signed: AIDA CHO, MD [facility] 06/03/2024 "

## 2024-06-04 LAB — SUSCEPTIBILITY, AER + ANAEROB

## 2024-06-04 LAB — SUSCEPTIBILITY RESULT

## 2024-06-05 LAB — CULTURE, BLOOD (ROUTINE X 2): Special Requests: ADEQUATE

## 2024-06-06 ENCOUNTER — Telehealth: Payer: Self-pay | Admitting: *Deleted

## 2024-06-06 DIAGNOSIS — K851 Biliary acute pancreatitis without necrosis or infection: Secondary | ICD-10-CM

## 2024-06-06 LAB — CULTURE, BLOOD (ROUTINE X 2)
Culture: NO GROWTH
Culture: NO GROWTH
Special Requests: ADEQUATE
Special Requests: ADEQUATE

## 2024-06-06 NOTE — Patient Instructions (Signed)
 Visit Information  Thank you for taking time to visit with me today. Please don't hesitate to contact me if I can be of assistance to you before our next scheduled telephone appointment.  Our next appointment is by telephone on Tuesday, 06/14/24 at 10:00 am  Please call the care guide team at [phone] if you need to cancel or reschedule your appointment.   Patient Self Care Activities:  Attend all scheduled provider appointments Call pharmacy for medication refills 3-7 days in advance of running out of medications Call provider office for new concerns or questions  Participate in Transition of Care Program/Attend TOC scheduled calls Take medications as prescribed   Empty biliary drain and measure as you have been, as instructed after your recent hospital visit Monitor pain and take pain medication as per ordered Monitor for any redness around tube site Continue monitoring and recording your blood sugars at home If you believe your condition is getting worse- contact your care providers (doctors) promptly- reaching out to your doctor early when you have concerns can prevent you from having to go to the hospital  Following is a copy of your care plan:   Goals Addressed             This Visit's Progress    [facility] Transitions of Care (TOC) Care Plan   On track    Problems:  Recent Hospitalization for treatment of Biliary colic- gallstone pancreatitis Knowledge Deficit Related to biliary colic and No Hospital Follow Up Provider appointment Rn made follow up appointment with PCP Recent hospitalization December 10- 16, 2025 for (R) upper quadrant abdominal pain/ biliary colic with percutaneous drain placement Independent; drives self; does not use assistive devices at baseline (1) unplanned hospital admission x last (6)/ (12) months  Goal:  Over the next 30 days, the patient will not experience hospital readmission  Interventions:  Transitions of Care: week # 1/ day # 1 Discussed  current clinical condition:  I am doing okay I guess; now I have 2 drains in my belly, but I am managing them okay on my own. Where the drains go into my stomach, it hurts, but they told me that was normal. Denies other clinical concerns and sounds to be in no distress throughout [facility] 30-day program outreach call today Durable Medical Equipment (DME) reviewed with patient/caregiver Community Resource Referral Made to address Financial Strain Food Insecurity Doctor Visits  - discussed the importance of doctor visits Communication with PCP re: successful re-enrollment into TOC 30-day program Arranged PCP follow-up within 12-14 days (Care Guide Scheduled) Post discharge activity limitations prescribed by provider reviewed Post-op wound/incision care reviewed with patient/caregiver Patient scheduled with [facility] BSW for provision of resources- scheduled call 06/07/24  Reviewed recent recent office visit with surgical provider 05/20/24- verbalizes good understanding of post-visit instructions and denies questions Confirmed patient continues monitoring blood sugars at home- but not recently because of this new pain I am having; confirms that PCP told me I don't have to check the blood sugars at home every day- just a couple of times per week  Re-reviewed most recent A1C value from 04/30/24:  6.6; reinforced historical trends over time with patient who verbalizes good understanding of same Reinforced blood sugar management at home; need to follow carbohydrate modified diet, take oral medications as prescribed Reviewed upcoming provider office visits: 06/22/24: surgical provider; confirmed patient is aware of all and has plans to attend as scheduled Hospital follow up PCP appointment successfully scheduled by [facility] RN CM in real-time  for 06/15/24- per patient's stated preference around visit timing   Patient Self Care Activities:  Attend all scheduled provider appointments Call pharmacy for medication refills  3-7 days in advance of running out of medications Call provider office for new concerns or questions  Participate in Transition of Care Program/Attend TOC scheduled calls Take medications as prescribed   Empty biliary drain and measure as you have been, as instructed after your recent hospital visit Monitor pain and take pain medication as per ordered Monitor for any redness around tube site Continue monitoring and recording your blood sugars at home If you believe your condition is getting worse- contact your care providers (doctors) promptly- reaching out to your doctor early when you have concerns can prevent you from having to go to the hospital  Plan:  Telephone follow up appointment with care management team member scheduled for: 06/07/24- [facility] BSW; 06/14/24- RN CM        If you are experiencing a Mental Health or Behavioral Health Crisis or need someone to talk to, please  call the Suicide and Crisis Lifeline: 988 call the [place]  National Suicide Prevention Lifeline: 831-045-2156 or TTY: 502-027-5214 TTY 4438790585) to talk to a trained counselor call 1-800-273-TALK (toll free, 24 hour hotline) go to [facility] [address], [place] [phone]) call the [facility] Crisis Line: 639-608-1796 call 911   Beatris Blinda Lawrence, RN, BSN, CCRN Alumnus RN Care Manager  Transitions of Care  [facility] - Population Health  [place] [phone]: direct office

## 2024-06-06 NOTE — Transitions of Care (Post Inpatient/ED Visit) (Signed)
 "  06/06/2024  Name: Jeffery Sweeney MRN: [id number] DOB: 11/02/1960  Today's TOC FU Call Status: Today's TOC FU Call Status:: Successful TOC FU Call Completed TOC FU Call Complete Date: 06/06/24  Patient's Name and Date of Birth confirmed. Name, DOB  Transition Care Management Follow-up Telephone Call Date of Discharge: 06/03/24 Discharge Facility: [facility] [address] [facility]) Type of Discharge: Inpatient Admission Primary Inpatient Discharge Diagnosis:: Acute gallstone pancreatitis How have you been since you were released from the hospital?: Better (I am doing okay I guess; now I have 2 drains in my belly, but I am managing them okay on my own.  Where the drains go into my stomach, it hurts, but they told me that was normal.) Any questions or concerns?: No  Items Reviewed: Did you receive and understand the discharge instructions provided?: Yes (thoroughly reviewed with patient who verbalizes good understanding of same) Medications obtained,verified, and reconciled?: Yes (Medications Reviewed) (Full medication reconciliation/ review completed; no concerns or discrepancies identified; confirmed patient obtained/ is taking all newly Rx'd medications as instructed; self-manages medications and denies questions/ concerns around medications today) Any new allergies since your discharge?: No Dietary orders reviewed?: Yes Type of Diet Ordered:: Diabetic as much as possible Do you have support at home?: Yes People in Home [RPT]: alone Name of Support/Comfort Primary Source: Reports independent in self-care activities; resides in boarding house; reports supportive friends assists as/ if needed/ indicated  Medications Reviewed Today: Medications Reviewed Today     Reviewed by Terasa Orsini M, RN (Registered Nurse) on 06/06/24 at [phone]  Med List Status: <None>   Medication Order Taking? Sig Documenting Provider Last Dose Status Informant  acetaminophen  (TYLENOL ) 500 MG  tablet [id number] Yes Take 2 tablets (1,000 mg total) by mouth every 6 (six) hours as needed. Clarine Ozell LABOR, MD  Active Self, Pharmacy Records  albuterol  (VENTOLIN  HFA) 108 [phone] Base) MCG/ACT inhaler [id number] Yes Inhale 2 puffs into the lungs every 6 (six) hours as needed for wheezing or shortness of breath. Towana Small, FNP  Active Self, Pharmacy Records  aspirin  81 MG chewable tablet [id number] Yes Chew 1 tablet (81 mg total) by mouth daily. Towana Small, FNP  Active Self, Pharmacy Records  clopidogrel  (PLAVIX ) 75 MG tablet [id number] Yes Take 1 tablet (75 mg total) by mouth daily. Darron Deatrice LABOR, MD  Active Self, Pharmacy Records  isosorbide  mononitrate (IMDUR ) 30 MG 24 hr tablet [id number] Yes Take 2 tablets (60 mg total) by mouth 2 (two) times daily. Towana Small, FNP  Active Self, Pharmacy Records  levofloxacin  (LEVAQUIN ) 750 MG tablet [id number] Yes Take 1 tablet (750 mg total) by mouth daily for 7 doses. Jens Durand, MD  Active   meclizine  (ANTIVERT ) 25 MG tablet [id number] Yes Take 2 tablets (50 mg total) by mouth 2 (two) times daily as needed for dizziness. Towana Small, FNP  Active Self, Pharmacy Records  metFORMIN  (GLUCOPHAGE -XR) 500 MG 24 hr tablet [id number] Yes Take 2 tablets (1,000 mg total) by mouth 2 (two) times daily with a meal. Towana Small, FNP  Active Self, Pharmacy Records           Med Note VASHTI INOCENTE JINNY Stevan May 25, 2024  1:54 AM) Last fill: 03-17-24 x 30 days.  metoprolol  tartrate (LOPRESSOR ) 50 MG tablet [id number] Yes Take 1 tablet (50 mg total) by mouth 2 (two) times daily. Towana Small, FNP  Active Self, Pharmacy Records  nitroGLYCERIN  (NITROSTAT ) 0.4 MG SL tablet [id number] Yes Place 1 tablet (0.4 mg  total) under the tongue every 5 (five) minutes as needed for chest pain. Gerard Frederick, NP  Active Self, Pharmacy Records  pantoprazole  (PROTONIX ) 20 MG tablet [id number] Yes Take 2 tablets (40 mg total) by mouth daily. Towana Small, FNP  Active  Self, Pharmacy Records  rosuvastatin  (CRESTOR ) 40 MG tablet [id number] Yes Take 1 tablet by mouth once daily Towana Small, FNP  Active Self, Pharmacy Records  sodium chloride  flush 0.9 % SOLN injection [id number] Yes 5 mLs by Intracatheter route daily. Desiderio Schanz, MD  Active Self, Pharmacy Records  sodium chloride  flush 0.9 % SOLN injection [id number] Yes 5 mLs by Intracatheter route daily. Flush cholecystostomy tube daily with 5 ccs NS Schulz, Zachary R, PA-C  Active Self, Pharmacy Records  sodium chloride  flush 0.9 % SOLN injection [id number] Yes 10 mLs by Intracatheter route daily. Flush EACH catheter with 5-50mL daily McInnis, Caitlyn M, PA-C  Active   traZODone  (DESYREL ) 50 MG tablet [id number] Yes Take 0.5-1 tablets (25-50 mg total) by mouth at bedtime as needed for sleep. Towana Small, FNP  Active Self, Pharmacy Records           Home Care and Equipment/Supplies: Were Home Health Services Ordered?: No Any new equipment or medical supplies ordered?: No  Functional Questionnaire: Do you need assistance with bathing/showering or dressing?: No Do you need assistance with meal preparation?: No Do you need assistance with eating?: No Do you have difficulty maintaining continence: No Do you need assistance with getting out of bed/getting out of a chair/moving?: No Do you have difficulty managing or taking your medications?: No  Follow up appointments reviewed: PCP Follow-up appointment confirmed?: Yes [facility] follow up PCP appointment successfully scheduled by [facility] RN CM in real-time for 06/15/24- per patient's stated preference around visit timing) Date of PCP follow-up appointment?: 06/15/24 Follow-up Provider: PCP- Small Tow[place] Specialist [facility] Follow-up appointment confirmed?: Yes Date of Specialist follow-up appointment?: 06/22/24 Follow-Up Specialty Provider:: Surgical provider: [place] Do you need transportation to your follow-up appointment?: No Do you understand  care options if your condition(s) worsen?: Yes-patient verbalized understanding  SDOH Interventions Today    Flowsheet Row Most Recent Value  SDOH Interventions   Food Insecurity Interventions Other (Comment)  [Patient scheduled with [facility]I BSW for provision of resources- scheduled call 06/07/24]  Housing Interventions Intervention Not Indicated  Transportation Interventions Intervention Not Indicated  [Reports continues to drive self]  Utilities Interventions Intervention Not Indicated  Financial Strain Interventions Other (Comment)  [Patient scheduled with [facility]BCI BSW for provision of resources- scheduled call 06/07/24]   See TOC assessment tabs/ care plan for additional assessment/ TOC intervention information: Successfully enrolled into 30-day TOC program  I appreciate the opportunity to participate in Keldrick's care,  Pls call/ message for questions,  Amayrany Cafaro Mckinney Sentoria Brent, RN, BSN, Media Planner  Transitions of Care  VBCI - Population Health  [place] [phone]: direct office  "

## 2024-06-07 ENCOUNTER — Other Ambulatory Visit: Payer: Self-pay

## 2024-06-07 NOTE — Patient Instructions (Signed)
 Visit Information  Thank you for taking time to visit with me today. Please don't hesitate to contact me if I can be of assistance to you before our next scheduled appointment.  Our next appointment is by telephone on 2/2 at 2pm Please call the care guide team at [phone] if you need to cancel or reschedule your appointment.   Following is a copy of your care plan:   Goals Addressed             This Visit's Progress    BSW Goals       Current SDOH Barriers:  Limited access to food  Interventions: Patient interviewed and appropriate screenings performed Referred patient to community resources  Discussed plans with patient for ongoing follow up and provided patient with direct contact number Advised patient to visit social services to apply for SNAP          Please call the Suicide and Crisis Lifeline: 988 call the [place]  National Suicide Prevention Lifeline: (212)134-2828 or TTY: (202)211-1042 TTY (351) 499-3751) to talk to a trained counselor call 1-800-273-TALK (toll free, 24 hour hotline) go to [facility] [address] [place] [phone]) call 911 if you are experiencing a Mental Health or Behavioral Health Crisis or need someone to talk to.  Patient verbalized understanding of Care plan and visit instructions communicated this visit  Orlean Fey, BSW [facility]  [facility] Institute Social Worker, [employer] [facility] [phone]

## 2024-06-07 NOTE — Patient Outreach (Signed)
 Social Drivers of Health  Community Resource and Care Coordination Visit Note   06/07/2024  Name: Jeffery Sweeney MRN: [id number] DOB:Apr 15, 1961  Situation: Referral received for [facility] needs assessment and assistance related to Financial Strain  Food Insecurity . I obtained verbal consent from Patient.  Visit completed with Patient on the phone.   Background:   SDOH Interventions Today    Flowsheet Row Most Recent Value  SDOH Interventions   Food Insecurity Interventions Community Resources Provided     Assessment:   BSW outreached patient today to assess for SDOH barriers following referral for food insecurity. During the call, patient reported that his Social Security income does not fully cover his monthly expenses and that he sometimes struggles to afford food. Patient stated that he has not previously applied for SNAP benefits and believed that his income may be too high to qualify. Patient also expressed concerns regarding food pantries, stating that they do not always provide the most nutritious options and that the food may not align with his dietary needs. BSW encouraged patient to visit Social Services to apply for SNAP benefits to determine eligibility. BSW will also send patient a list of food pantries as an additional option. Patient further stated that he is in need of dental work and requested assistance locating dentists in the area that may be able to help him. BSW will send patient a list of low-cost or sliding scale dental resources. BSW will follow up with patient on February 2 at 2:00 PM to review resources and address any additional needs.    Goals Addressed             This Visit's Progress    BSW Goals       Current SDOH Barriers:  Limited access to food  Interventions: Patient interviewed and appropriate screenings performed Referred patient to community resources  Discussed plans with patient for ongoing follow up and provided patient with direct  contact number Advised patient to visit social services to apply for [facility]          Recommendation:   attend all scheduled provider appointments call for transportation assistance at least one week before appointments ask for help if you don't understand your health insurance benefits complete SNAP application  Follow Up Plan:   Telephone follow-up on 2/2  at 2pm  Orlean Fey, HEDWIG [facility]  Value Based Care Institute Social Worker, [employer] [facility] [phone]

## 2024-06-08 ENCOUNTER — Telehealth: Payer: Self-pay

## 2024-06-08 ENCOUNTER — Other Ambulatory Visit: Payer: Self-pay

## 2024-06-08 DIAGNOSIS — K8050 Calculus of bile duct without cholangitis or cholecystitis without obstruction: Secondary | ICD-10-CM

## 2024-06-08 NOTE — Telephone Encounter (Signed)
-----   Message from Rogelia Copping, MD sent at 05/27/2024  7:23 PM EST ----- Regarding: Ercp This patient needs an ercp for stone removal in 4 weeks.

## 2024-06-08 NOTE — Telephone Encounter (Signed)
 I spoke to pt and scheduled ERCP for stone removal... PPW mailed to pt

## 2024-06-13 ENCOUNTER — Other Ambulatory Visit: Payer: Self-pay

## 2024-06-14 ENCOUNTER — Other Ambulatory Visit: Payer: Self-pay | Admitting: *Deleted

## 2024-06-14 NOTE — Transitions of Care (Post Inpatient/ED Visit) (Signed)
 " Transition of Care week 2/ day # 8  Visit Note  06/14/2024  Name: Jeffery Sweeney MRN: [id number]          DOB: [birth year]  Situation: Patient enrolled in [facility] 30-day program. Visit completed with patient by telephone   HIPAA identifiers x 2 verified  Background:  Recent hospitalization December 10- 16, 2025 for (R) upper quadrant abdominal pain/ biliary colic with percutaneous drain placement Hospital re-admission January 6-16, 2026 for acute gallstone pancreatitis with ongoing percutaneous drain placements x 2 Independent; drives self; does not use assistive devices at baseline (2) unplanned hospital admission x last (6)/ (12) months  Initial Transition Care Management Follow-up Telephone Call Discharge Date and Diagnosis: 06/03/24, Acute gallstone pancreatitis   Past Medical History:  Diagnosis Date   CAD (coronary artery disease)    a. PCI x 3 - [place] ; b. 06/2020 NSTEMI-->CABG x 3 (LIMA->LAD, VG->RI, VG->RPDA); c. 09/2021 STEMI/PCI: LM nl, LAD 80p/m, D1 90, lat D1 90, D2 80, LCX 67m/d, OM2 80, RCA 19m, 30d, RPDA 90, VG->prox RCA->RPDA 30p, VG->D1 mild dzs, LIMA->LAD 80 (DES)-->complicated by acute closure-->DES x 7; d. 03/2022 MV: No isch/infarct, EF 54%.   CVA (cerebral vascular accident) (HCC) 06/2021   Small acute/subacute infarcts in the left occipital lobe and   Diastolic dysfunction    a. 10/2022 Echo: EF 55-60%, no rwma, GrI DD, nl RV size/fxn, mild MR/AI.   Hyperlipidemia LDL goal <70    started on Repatha  09/26/2021, continue Crestor  40mg  daily   Hypertension    Multiple vessel coronary artery disease    Myocardial infarct (HCC)    x 3; PCI with single stent placement for each (in [place] )   NSTEMI (non-ST elevated myocardial infarction) (HCC) 06/2020   STEMI (ST elevation myocardial infarction) (HCC) 09/23/2021   LHC 09/23/2021 x 2   Tobacco use    2 cigarettes daily   Unstable angina (HCC)    LHC 09/23/2021 x 2; total 8 DES to LIMA   Assessment:  I am  still doing okay; just ready to get this upcoming surgery behind me and over with.  Sick of having these drains in my stomach; have been flushing them, one of them is not draining at all, the other one is draining a little brown liquid.  It is still uncomfortable where they are, when I move.  Going to see the doctor tomorrow and the surgeon next week and they will tell me when the surgery is    Denies other clinical concerns and sounds to be in no distress throughout [facility] 30-day program outreach call today  Patient Reported Symptoms: Cognitive Cognitive Status: Normal speech and language skills, Alert and oriented to person, place, and time, Insightful and able to interpret abstract concepts Cognitive/Intellectual Conditions Management [RPT]: None reported or documented in medical history or problem list      Neurological Neurological Review of Symptoms: No symptoms reported    HEENT HEENT Symptoms Reported: No symptoms reported      Cardiovascular Cardiovascular Symptoms Reported: No symptoms reported Does patient have uncontrolled Hypertension?: No Cardiovascular Management Strategies: Routine screening, Medication therapy, Coping strategies, Adequate rest  Respiratory Respiratory Symptoms Reported: No symptoms reported Other Respiratory Symptoms: Denies shortness of breath and sounds to be in no respiratory distress throughout [facility] call Respiratory Management Strategies: Coping strategies, Adequate rest  Endocrine Endocrine Symptoms Reported: No symptoms reported Is patient diabetic?: Yes Is patient checking blood sugars at home?: No List most recent blood sugar readings, include date and time  of day: I have not been checking the blood sugars very much lately, I just have not felt like it Patient was encouraged to resume periodic blood sugar monitoring at home    Gastrointestinal Gastrointestinal Symptoms Reported: Abdominal pain or discomfort Other Gastrointestinal Symptoms: Reports  ongoing abdominal discomfort where the drains are- declines quantifying discomfort; reports eating okay; confirms flushing drains every day as instructed: one is draining brown liquid and the other one is not draining anything at all; confirms drains are flushing well; confirms having normal/ regular BM's Gastrointestinal Management Strategies: Coping strategies, Adequate rest (routine abdominal drain flushes; percutaneous abdominal drains x 2)    Genitourinary Genitourinary Symptoms Reported: No symptoms reported    Integumentary Integumentary Symptoms Reported: Other Other Integumentary Symptoms: Skin around drains look good Skin Management Strategies: Routine screening, Dressing changes, Coping strategies  Musculoskeletal Musculoskelatal Symptoms Reviewed: No symptoms reported        Psychosocial Psychosocial Symptoms Reported: No symptoms reported Behavioral Management Strategies: Support system, Coping strategies       There were no vitals filed for this visit.    Medications Reviewed Today     Reviewed by Aldred Mase M, RN (Registered Nurse) on 06/14/24 at 1001  Med List Status: <None>   Medication Order Taking? Sig Documenting Provider Last Dose Status Informant  albuterol  (VENTOLIN  HFA) 108 (90 Base) MCG/ACT inhaler [id number]  Inhale 2 puffs into the lungs every 6 (six) hours as needed for wheezing or shortness of breath. Towana Small, FNP  Active Self  aspirin  81 MG chewable tablet [id number]  Chew 1 tablet (81 mg total) by mouth daily. Towana Small, FNP  Active Self  clopidogrel  (PLAVIX ) 75 MG tablet [id number]  Take 1 tablet (75 mg total) by mouth daily. Darron Deatrice LABOR, MD  Active Self  isosorbide  mononitrate (IMDUR ) 30 MG 24 hr tablet [id number]  Take 2 tablets (60 mg total) by mouth 2 (two) times daily. Towana Small, FNP  Active Self  meclizine  (ANTIVERT ) 25 MG tablet [id number]  Take 2 tablets (50 mg total) by mouth 2 (two) times daily as needed for  dizziness. Towana Small, FNP  Active Self  metFORMIN  (GLUCOPHAGE -XR) 500 MG 24 hr tablet [id number]  Take 2 tablets (1,000 mg total) by mouth 2 (two) times daily with a meal. Towana Small, FNP  Active Self           Med Note CLAUD, MICHEAL DASEN   Fri Jun 10, 2024 11:03 AM)    metoprolol  tartrate (LOPRESSOR ) 50 MG tablet [id number]  Take 1 tablet (50 mg total) by mouth 2 (two) times daily. Towana Small, FNP  Active Self  nitroGLYCERIN  (NITROSTAT ) 0.4 MG SL tablet [id number]  Place 1 tablet (0.4 mg total) under the tongue every 5 (five) minutes as needed for chest pain. Gerard Frederick, NP  Active Self  pantoprazole  (PROTONIX ) 20 MG tablet [id number]  Take 2 tablets (40 mg total) by mouth daily. Towana Small, FNP  Active Self  rosuvastatin  (CRESTOR ) 40 MG tablet [id number]  Take 1 tablet by mouth once daily Towana Small, FNP  Active Self  sodium chloride  flush 0.9 % SOLN injection [id number]  5 mLs by Intracatheter route daily. Desiderio Schanz, MD  Active Self  sodium chloride  flush 0.9 % SOLN injection [id number]  5 mLs by Intracatheter route daily. Flush cholecystostomy tube daily with 5 ccs NS Schulz, Zachary R, PA-C  Active Self  sodium chloride  flush 0.9 % SOLN injection [id number]  10 mLs by Intracatheter route daily. Flush EACH catheter with  5-58mL daily McInnis, Caitlyn M, [place]  Active Self  traZODone  (DESYREL ) 50 MG tablet [id number]  Take 0.5-1 tablets (25-50 mg total) by mouth at bedtime as needed for sleep.  Patient taking differently: Take 50 mg by mouth at bedtime.   Towana Small, FNP  Active Self           Recommendation:   PCP Follow-up: as scheduled 06/15/24 Specialty provider follow-up: as scheduled 06/22/24 Continue Current Plan of Care  Follow Up Plan:   Telephone follow-up in 1 week- as scheduled per patient preference 06/23/24  Pls call/ message for questions,  Maggi Hershkowitz Mckinney Domique Clapper, RN, BSN, CCRN Alumnus RN Care Manager  Transitions of Care  [facility] -  [facility] [phone]: direct office     "

## 2024-06-14 NOTE — Patient Instructions (Signed)
 Visit Information  Thank you for taking time to visit with me today. Please don't hesitate to contact me if I can be of assistance to you before our next scheduled telephone appointment.  Our next appointment is by telephone on Thursday, 06/23/24 at 11:00 am  Please call the care guide team at [phone] if you need to cancel or reschedule your appointment.   Following are the goals we discussed today:  Patient Self Care Activities:  Attend all scheduled provider appointments Call pharmacy for medication refills 3-7 days in advance of running out of medications Call provider office for new concerns or questions  Participate in Transition of Care Program/Attend TOC scheduled calls Take medications as prescribed   Empty biliary drain and measure as you have been, as instructed after your recent hospital visit Monitor pain and take pain medication as per ordered Monitor for any redness around tube site Continue monitoring and recording your blood sugars at home If you believe your condition is getting worse- contact your care providers (doctors) promptly- reaching out to your doctor early when you have concerns can prevent you from having to go to the hospital  If you are experiencing a Mental Health or Behavioral Health Crisis or need someone to talk to, please  call the Suicide and Crisis Lifeline: 988 call the [place]  National Suicide Prevention Lifeline: 312-206-8791 or TTY: (647) 149-6376 TTY 2703608368) to talk to a trained counselor call 1-800-273-TALK (toll free, 24 hour hotline) go to [facility] [address] [place] [phone]) call the [facility] Crisis Line: [phone] call 911   Care plan and visit instructions communicated with the patient verbally today. The patient DECLINED to receive copy of care plan and patient instructions in any format.   Pls call for questions,  Kimblery Diop Mckinney Thornton Dohrmann, RN, BSN, CCRN Alumnus RN  Care Manager  Transitions of Care  [facility]BC[facility] - [facility] [phone]: direct office

## 2024-06-15 ENCOUNTER — Telehealth: Payer: Self-pay

## 2024-06-15 ENCOUNTER — Encounter: Payer: Self-pay | Admitting: Family Medicine

## 2024-06-15 ENCOUNTER — Ambulatory Visit: Payer: Self-pay | Admitting: Family Medicine

## 2024-06-15 ENCOUNTER — Other Ambulatory Visit: Payer: Self-pay

## 2024-06-15 VITALS — BP 127/83 | HR 90 | Resp 16 | Ht 63.0 in | Wt 163.0 lb

## 2024-06-15 DIAGNOSIS — D649 Anemia, unspecified: Secondary | ICD-10-CM

## 2024-06-15 DIAGNOSIS — I252 Old myocardial infarction: Secondary | ICD-10-CM

## 2024-06-15 DIAGNOSIS — K219 Gastro-esophageal reflux disease without esophagitis: Secondary | ICD-10-CM

## 2024-06-15 DIAGNOSIS — E785 Hyperlipidemia, unspecified: Secondary | ICD-10-CM

## 2024-06-15 DIAGNOSIS — R0609 Other forms of dyspnea: Secondary | ICD-10-CM

## 2024-06-15 DIAGNOSIS — I2510 Atherosclerotic heart disease of native coronary artery without angina pectoris: Secondary | ICD-10-CM

## 2024-06-15 DIAGNOSIS — R42 Dizziness and giddiness: Secondary | ICD-10-CM

## 2024-06-15 DIAGNOSIS — F5101 Primary insomnia: Secondary | ICD-10-CM

## 2024-06-15 DIAGNOSIS — E119 Type 2 diabetes mellitus without complications: Secondary | ICD-10-CM

## 2024-06-15 DIAGNOSIS — I10 Essential (primary) hypertension: Secondary | ICD-10-CM

## 2024-06-15 DIAGNOSIS — Z01818 Encounter for other preprocedural examination: Secondary | ICD-10-CM

## 2024-06-15 DIAGNOSIS — Z720 Tobacco use: Secondary | ICD-10-CM

## 2024-06-15 MED ORDER — CLOPIDOGREL BISULFATE 75 MG PO TABS: 75.0000 mg | ORAL_TABLET | Freq: Every day | ORAL | 3 refills | Status: AC

## 2024-06-15 MED ORDER — PANTOPRAZOLE SODIUM 20 MG PO TBEC
40.0000 mg | DELAYED_RELEASE_TABLET | Freq: Every day | ORAL | 3 refills | Status: AC
  Filled 2024-06-15: qty 60, 30d supply, fill #0

## 2024-06-15 MED ORDER — TRAZODONE HCL 50 MG PO TABS
50.0000 mg | ORAL_TABLET | Freq: Every day | ORAL | 2 refills | Status: AC
  Filled 2024-06-15: qty 30, 30d supply, fill #0

## 2024-06-15 MED ORDER — METFORMIN HCL ER 500 MG PO TB24: 1000.0000 mg | ORAL_TABLET | Freq: Two times a day (BID) | ORAL | 3 refills | Status: AC

## 2024-06-15 MED ORDER — METOPROLOL TARTRATE 50 MG PO TABS
50.0000 mg | ORAL_TABLET | Freq: Two times a day (BID) | ORAL | 5 refills | Status: AC
  Filled 2024-06-15: qty 60, 30d supply, fill #0

## 2024-06-15 MED ORDER — ROSUVASTATIN CALCIUM 40 MG PO TABS: 40.0000 mg | ORAL_TABLET | Freq: Every day | ORAL | 6 refills | Status: AC

## 2024-06-15 MED ORDER — MECLIZINE HCL 25 MG PO CHEW
50.0000 mg | CHEWABLE_TABLET | Freq: Two times a day (BID) | ORAL | 2 refills | Status: AC | PRN
  Filled 2024-06-15: qty 100, 25d supply, fill #0

## 2024-06-15 MED ORDER — NITROGLYCERIN 0.4 MG SL SUBL
0.4000 mg | SUBLINGUAL_TABLET | SUBLINGUAL | 1 refills | Status: AC | PRN
  Filled 2024-06-15: qty 25, 1d supply, fill #0

## 2024-06-15 NOTE — Progress Notes (Unsigned)
 "  Cardiology Office Note    Date:  06/15/2024   ID:  Jeffery Sweeney, DOB 16-Aug-1960, MRN [id number]  PCP:  Towana Small, FNP  Cardiologist:  Deatrice Cage, MD  Electrophysiologist:  None   Chief Complaint: Preoperative cardiac risk stratification  History of Present Illness:   Jeffery Sweeney is a 64 y.o. male with history of CAD status post prior PCI in 2019 in [place]  status post three-vessel CABG in 2022, HTN, HLD, prior CVA noted on imaging in 2023, ascending cholangitis complicated by Klebsiella pneumonia bacteremia and acute pancreatitis, and tobacco use ***  Cardiac history dates back to 2019, when he underwent PCI, reportedly x 3, in [place] .  In February 2022, he presented was admitted to [facility] regional with non-STEMI and underwent diagnostic catheterization revealing severe three-vessel coronary artery disease.  Echo showed normal LV function.  He was transferred to [facility] underwent CABG x 3 with a LIMA to LAD, vein graft to the RPDA, and vein graft to the ramus intermedius.  In May 2022, he was readmitted with atypical chest pain.  A stress test was performed in the outpatient setting in July 2022 and was normal.  In February 2023, he was admitted for TIA.  Echo showed an EF of 60 to 65% with grade 1 diastolic dysfunction and mild MR.  In May 2023 he was seen in the office following an episode of chest pain.  Decision was made to pursue diagnostic catheterization which was performed in May 2023 with finding of severe multivessel CAD as well as 80% ostial stenosis in the LIMA to the LAD with a hazy appearance.  When the LIMA was engaged, there was acute closure of the graft and aspiration thrombectomy and drug-eluting stent placement was performed.  He was placed on tirofiban  postprocedure and admitted but approximately 6 hours later, he developed recurrent chest pain with anterior ST segment elevation requiring emergent diagnostic catheterization with  finding of abrupt closure of the LIMA to the LAD with evidence of a spiral dissection to the distal anastomosis.  Patient required placement of 7 overlapping drug-eluting stents.  He was maintained on tirofiban  for 18 hours postprocedure.  He had recurrent atypical chest pain in November 2023 and underwent stress testing which was negative for ischemia with normal LV function.  Most recent echo in June 2024 showed EF of 55 to 60% with grade 1 diastolic dysfunction, normal RV size/function, mild MR/AI, and aortic sclerosis without stenosis.   Jeffery Sweeney was last seen in cardiology clinic in January 2025, at which time he noted intermittent exertional dyspnea and chest/sternal soreness.  ECG was unchanged.  Symptoms were overall felt to be stable, dating back to his normal stress test, and he was medically managed.  ***  DASI: *** METs RCRI: *** risk for noncardiac procedure    Labs independently reviewed: 05/2024 - Hgb 12.1, PLT 155, potassium 3.7, BUN 11, serum creatinine 0.72, TC 51, TG 113, HDL 14, LDL 15, albumin  3.0, AST/ALT normal, magnesium  1.5 04/2024 - A1c 6.6 11/2020 - TSH normal  Past Medical History:  Diagnosis Date   CAD (coronary artery disease)    a. PCI x 3 - [place] ; b. 06/2020 NSTEMI-->CABG x 3 (LIMA->LAD, VG->RI, VG->RPDA); c. 09/2021 STEMI/PCI: LM nl, LAD 80p/m, D1 90, lat D1 90, D2 80, LCX 55m/d, OM2 80, RCA 75m, 30d, RPDA 90, VG->prox RCA->RPDA 30p, VG->D1 mild dzs, LIMA->LAD 80 (DES)-->complicated by acute closure-->DES x 7; d. 03/2022 MV: No isch/infarct, EF 54%.   CVA (  cerebral vascular accident) (HCC) 06/2021   Small acute/subacute infarcts in the left occipital lobe and   Diastolic dysfunction    a. 10/2022 Echo: EF 55-60%, no rwma, GrI DD, nl RV size/fxn, mild MR/AI.   Hyperlipidemia LDL goal <70    started on Repatha  09/26/2021, continue Crestor  40mg  daily   Hypertension    Multiple vessel coronary artery disease    Myocardial infarct (HCC)    x 3; PCI with  single stent placement for each (in [place] )   NSTEMI (non-ST elevated myocardial infarction) (HCC) 06/2020   STEMI (ST elevation myocardial infarction) (HCC) 09/23/2021   LHC 09/23/2021 x 2   Tobacco use    2 cigarettes daily   Unstable angina (HCC)    LHC 09/23/2021 x 2; total 8 DES to LIMA    Past Surgical History:  Procedure Laterality Date   CORONARY ANGIOPLASTY WITH STENT PLACEMENT     in [place]  in 2018-19   CORONARY ARTERY BYPASS GRAFT N/A 07/09/2020   Procedure: CORONARY ARTERY BYPASS GRAFTING (CABG) x THREE , USING LEFT INTERNAL MAMMARY ARTERY, AND RIGHT LEG GREATER SAPHENOUS VEIN HARVESTED ENDOSCOPICALLY;  Surgeon: Army Dallas NOVAK, MD;  Location: [facility] OR;  Service: Open Heart Surgery;  Laterality: N/A;   CORONARY PRESSURE/FFR STUDY N/A 07/06/2020   Procedure: INTRAVASCULAR PRESSURE WIRE/FFR STUDY;  Surgeon: Darron Deatrice LABOR, MD;  Location: [facility] INVASIVE CV LAB;  Service: Cardiovascular;  Laterality: N/A;  LAD   CORONARY STENT INTERVENTION N/A 09/23/2021   Procedure: CORONARY STENT INTERVENTION;  Surgeon: Darron Deatrice LABOR, MD;  Location: [facility] INVASIVE CV LAB;  Service: Cardiovascular;  Laterality: N/A;   CORONARY/GRAFT ACUTE MI REVASCULARIZATION N/A 09/23/2021   Procedure: Coronary/Graft Acute MI Revascularization;  Surgeon: Darron Deatrice LABOR, MD;  Location: [facility] INVASIVE CV LAB;  Service: Cardiovascular;  Laterality: N/A;   ERCP N/A 05/25/2024   Procedure: ERCP, WITH INTERVENTION IF INDICATED;  Surgeon: Jinny Carmine, MD;  Location: [facility] ENDOSCOPY;  Service: Endoscopy;  Laterality: N/A;   IR EXCHANGE BILIARY DRAIN  05/27/2024   IR INT EXT BILIARY DRAIN WITH CHOLANGIOGRAM  05/27/2024   IR PERC CHOLECYSTOSTOMY  04/29/2024   LEFT HEART CATH AND CORONARY ANGIOGRAPHY N/A 07/06/2020   Procedure: LEFT HEART CATH AND CORONARY ANGIOGRAPHY;  Surgeon: Darron Deatrice LABOR, MD;  Location: [facility] INVASIVE CV LAB;  Service: Cardiovascular;  Laterality: N/A;   LEFT HEART CATH AND CORONARY ANGIOGRAPHY  N/A 09/23/2021   Procedure: LEFT HEART CATH AND CORONARY ANGIOGRAPHY;  Surgeon: Darron Deatrice LABOR, MD;  Location: [facility] INVASIVE CV LAB;  Service: Cardiovascular;  Laterality: N/A;   LEFT HEART CATH AND CORS/GRAFTS ANGIOGRAPHY N/A 09/23/2021   Procedure: LEFT HEART CATH AND CORS/GRAFTS ANGIOGRAPHY;  Surgeon: Darron Deatrice LABOR, MD;  Location: [facility] INVASIVE CV LAB;  Service: Cardiovascular;  Laterality: N/A;   TEE WITHOUT CARDIOVERSION N/A 07/09/2020   Procedure: TRANSESOPHAGEAL ECHOCARDIOGRAM (TEE);  Surgeon: Army Dallas NOVAK, MD;  Location: [facility] OR;  Service: Open Heart Surgery;  Laterality: N/A;   WRIST SURGERY Left    for cyst    Current Medications: Active Medications[1]  Allergies:   Patient has no known allergies.   Social History   Socioeconomic History   Marital status: Divorced    Spouse name: Not on file   Number of children: Not on file   Years of education: Not on file   Highest education level: Not on file  Occupational History   Not on file  Tobacco Use   Smoking status: Former    Current packs/day: 0.00  Average packs/day: 0.5 packs/day    Types: Cigarettes    Quit date: 07/06/2020    Years since quitting: 3.9    Passive exposure: Past   Smokeless tobacco: Never   Tobacco comments:    Quit 06/03/24  Vaping Use   Vaping status: Never Used  Substance and Sexual Activity   Alcohol use: Not Currently   Drug use: Not Currently   Sexual activity: Not Currently  Other Topics Concern   Not on file  Social History Narrative   Not on file   Social Drivers of Health   Tobacco Use: Medium Risk (06/15/2024)   Patient History    Smoking Tobacco Use: Former    Smokeless Tobacco Use: Never    Passive Exposure: Past  Physicist, Medical Strain: High Risk (06/07/2024)   Overall Financial Resource Strain (CARDIA)    Difficulty of Paying Living Expenses: Hard  Food Insecurity: Food Insecurity Present (06/07/2024)   Epic    Worried About Programme Researcher, Broadcasting/film/video in the Last Year:  Often true    Ran Out of Food in the Last Year: Often true  Transportation Needs: No Transportation Needs (06/07/2024)   Epic    Lack of Transportation (Medical): No    Lack of Transportation (Non-Medical): No  Physical Activity: Not on file  Stress: Not on file  Social Connections: Patient Declined (04/28/2024)   Social Connection and Isolation Panel    Frequency of Communication with Friends and Family: Patient declined    Frequency of Social Gatherings with Friends and Family: Patient declined    Attends Religious Services: Patient declined    Database Administrator or Organizations: Patient declined    Attends Banker Meetings: Patient declined    Marital Status: Patient declined  Depression (PHQ2-9): Low Risk (06/14/2024)   Depression (PHQ2-9)    PHQ-2 Score: 1  Alcohol Screen: Not on file  Housing: Unknown (06/07/2024)   Epic    Unable to Pay for Housing in the Last Year: No    Number of Times Moved in the Last Year: Not on file    Homeless in the Last Year: No  Utilities: Not At Risk (06/07/2024)   Epic    Threatened with loss of utilities: No  Health Literacy: Not on file     Family History:  The patient's family history includes Diabetes Mellitus II in his father.  ROS:   12-point review of systems is negative unless otherwise noted in the HPI.   EKGs/Labs/Other Studies Reviewed:    Studies reviewed were summarized above. The additional studies were reviewed today:  2D echo 11/13/2022: 1. Left ventricular ejection fraction, by estimation, is 55 to 60%. The  left ventricle has normal function. The left ventricle has no regional  wall motion abnormalities. Left ventricular diastolic parameters are  consistent with Grade I diastolic  dysfunction (impaired relaxation). The average left ventricular global  longitudinal strain is -17.0 %.   2. Right ventricular systolic function is normal. The right ventricular  size is normal. Tricuspid regurgitation  signal is inadequate for assessing  PA pressure.   3. The mitral valve is normal in structure. Mild mitral valve  regurgitation. No evidence of mitral stenosis.   4. The aortic valve has an indeterminant number of cusps. Aortic valve  regurgitation is mild. Aortic valve sclerosis is present, with no evidence  of aortic valve stenosis.   5. The inferior vena cava is normal in size with greater than 50%  respiratory variability, suggesting right  atrial pressure of 3 mmHg.  __________  Lexiscan  MPI 04/09/2022:   The study is low risk.   No ST deviation was noted.   LV perfusion is normal.   Left ventricular function is normal. calculated LVEF is 54%   Patient is s/p cabg x 3. __________  See CV studies in Epic for more remote imaging  EKG:  EKG is ordered today.  The EKG ordered today demonstrates ***  Recent Labs: 11/30/2023: B Natriuretic Peptide 42.4 05/28/2024: Magnesium  1.5 05/29/2024: ALT 44 05/31/2024: BUN 11; Creatinine, Ser 0.72; Potassium 3.7; Sodium 135 06/01/2024: Hemoglobin 12.1; Platelets 155  Recent Lipid Panel    Component Value Date/Time   CHOL 51 05/31/2024 0401   CHOL 143 06/16/2023 1019   TRIG 113 05/31/2024 0401   HDL 14 (L) 05/31/2024 0401   HDL 32 (L) 06/16/2023 1019   CHOLHDL 3.7 05/31/2024 0401   VLDL 23 05/31/2024 0401   LDLCALC 15 05/31/2024 0401   LDLCALC 75 06/16/2023 1019    PHYSICAL EXAM:    VS:  There were no vitals taken for this visit.  BMI: There is no height or weight on file to calculate BMI.  Physical Exam  Wt Readings from Last 3 Encounters:  06/15/24 163 lb (73.9 kg)  05/24/24 178 lb 9.2 oz (81 kg)  05/20/24 179 lb (81.2 kg)     ASSESSMENT & PLAN:   Preoperative cardiac risk stratification:  CAD status post CABG with ***:  HTN: Blood pressure  HLD: LDL 15 in 05/2024.  Tobacco use:   {Are you ordering a CV Procedure (e.g. stress test, cath, DCCV, TEE, etc)?   Press F2        :[id number]}     Disposition: F/u with Dr.  Darron or an APP in ***.   Medication Adjustments/Labs and Tests Ordered: Current medicines are reviewed at length with the patient today.  Concerns regarding medicines are outlined above. Medication changes, Labs and Tests ordered today are summarized above and listed in the Patient Instructions accessible in Encounters.   Signed, Jeffery Bring, PA-C 06/15/2024 4:20 PM     [facility] [place] [address] [place], [place] [address] [phone]    [1]  No outpatient medications have been marked as taking for the 06/16/24 encounter (Appointment) with Sweeney Jeffery HERO, PA-C.   "

## 2024-06-15 NOTE — Telephone Encounter (Signed)
 Patient needs to see Cardiology for Pre Op Clearance procedure 06/28/2024. Called [facility] 11:20 tomorrow they can have PA see him for this. Will advise patient at appt today that he must go to that visit to be cleared.

## 2024-06-15 NOTE — Telephone Encounter (Signed)
"  ° °  [facility] Pre-operative Risk Assessment    Request for surgical clearance:  What type of surgery is being performed? ERCP   When is this surgery scheduled? Feb 23rd   Are there any medications that need to be held prior to surgery and how long? Plavix    Practice name and name of physician performing surgery? Dr Rogelia Copping, hospitalist  What is your office phone and fax number? Ph: [phone] Fx: [phone]   Anesthesia type (None, local, MAC, general) ? General   Jeffery Sweeney Y 06/15/2024, 8:34 AM  _________________________________________________________________   (provider comments below)   "

## 2024-06-15 NOTE — Telephone Encounter (Signed)
 Patient has an appointment with Bernardino Bring, PA-C, tomorrow. Therefore, pre-op evaluation can be completed at that time. Will route this to Bernardino so he is aware of upcoming procedure.   Of note, Dr. Arida  previously gave the okay to hold Plavix  for 5 days on 05/25/2024 when plan was for robotic assisted cholecystectomy.   Will remove from pre-po pool.  ~Jalyn Dutta

## 2024-06-15 NOTE — Progress Notes (Signed)
 "  Established Patient Office Visit  Subjective  Patient ID: Jeffery Sweeney, male    DOB: Mar 31, 1961  Age: 64 y.o. MRN: [id number]  Chief Complaint  Patient presents with   Hospitalization Follow-up   Discussed the use of AI scribe software for clinical note transcription with the patient, who gave verbal consent to proceed.  History of Present Illness   Jeffery Sweeney is a 64 year old male who presents for a hospital follow-up. He has concerns about right shoulder pain and difficulty sleeping. He is also here for pre-operative medical clearance for an anticipated procedure for robotic-assisted cholecystectomy w/ ICG on 06/28/2024.   He has a history of essential hypertension, tobacco use, hyperlipidemia, diabetes type 2, history of CVA, and coronary artery disease. He has undergone a CABG x3 with LIMA to LAD, SVG to right PDA and SVG to ramus. He has recurrent atypical chest pain since then. His last cardiology appointment was on 06/16/2023 and was supposed to have a 61-month f/u appt.   Most recent hospitalization 1/7-1/16: for acute gallstone pancreatitis. CT abd/pelvis showed mild gallbladder wal thickening & edema with cholecystostomy tube in place without stones or bile duct dilation. There is a 2mm stone in the lower pole of the L kidney, without hydronephrosis or hydroureter. MRI abd/MRCP: showed minimal dilation of the common bile duct (7mm) with tiny signal abnormality in the distal common bile duct. He was admitted for choledocholithiasis.   Currently, he experiences severe pain in his right shoulder, which worsens with movement and affects his ability to sleep. The pain radiates to the area where his perc cholecystectomy drain has been inserted in his right abdomen. He has a history of gallbladder issues and recently underwent a procedure where stones were crushed.   He has significant weight loss and a lack of appetite. Eating causes stomach pain, and he has difficulty  consuming certain foods. He can eat fruits and cereal but dislikes grilled chicken. He has had ten bowel movements since returning home, with the first three being loose and the subsequent ones being hard. He notes a change in stool color to a pale, yellowish hue. He was 180lb at his last visit on 05/17/2024; today he is 163lb. He reports it 'hurts' when he eats and does not have much of an appetite. Denies nausea/vomiting.   He experiences dizziness and shortness of breath, particularly when climbing stairs. He uses an albuterol  inhaler, which he feels is not effective. He has a new, unopened inhaler and is unsure of its expiration date. He also reports occasional sneezing and a sensation of something being clogged in his throat.  He is currently taking trazodone  for sleep, which he finds only partially effective. He was previously on antibiotics starting with an 'L' and mentions difficulty swallowing them. He also takes Maalox for stomach discomfort.  He reports that he is out of several medications, including Plavix , metoprolol , and cholesterol medication. He has an outdated nitroglycerin  prescription and is concerned about not having pain medication after his recent hospital discharge.  He mentions a history of vertigo, which he believes is contributing to his dizziness. He is worried about his father's health, who is 1 years old, and is being cared for by a caregiver.     General Risk Score: 7  Values used to calculate this score:   Points  Metrics      0        Age: 24      3  Hospital Admissions: 4      3        ED Visits: 8      0        Has Chronic Obstructive Pulmonary Disease: No      1        Has Diabetes Excluding Gestational Diabetes: Yes      0        Has Congestive Heart Failure: No      0        Has Liver Disease: No      0        Has Depression: No      0        Current PCP: Evalene Arts, FNP      0        Has Medicaid: No  RCRI Calculator: 4 points, with 10% risk  of major cardiac event  D[facility] Activity Status Index (DASI)/ Functional Capacity: 7.2 points, 3.63 METS   Do you usually get chest pain or breathlessness when you climb up two flights of stairs at normal speed?  yes Do you have chronic kidney disease? no Have anyone in your family (blood relatives) had a problem following an anaesthetic? no Have you ever had a heart attack? yes Have you ever been diagnosed with an irregular heartbeat? no Have you ever had a stroke? yes If you have been put to sleep for an operation were there any anaesthetic problems? no Do you suffer from epilepsy or seizures? no Do you have problems with pain, stiffness or arthritis in your neck or jaw? no Do you have thyroid  disease? no Do you have liver disease? no Have you ever been diagnosed with heart failure? yes, diastolic dysfunction Do you suffer from asthma? no Do you have diabetes that requires insulin ? no Do you have diabetes that requires oral medications only? yes Recent hemoglobin A1c: 6.6% (04/30/2024) Do you suffer from chronic bronchitis? yes Do you have intermittent chest pain or tightness? Not currently   I have evaluated Jeffery Sweeney related to their upcoming surgery. He is medically stable and cleared for this procedure pending cardiac clearance from cardiology.   ROS: see HPI     Objective:    BP 127/83   Pulse 90   Resp 16   Ht 5' 3 (1.6 m)   Wt 163 lb (73.9 kg)   SpO2 95%   BMI 28.87 kg/m  BP Readings from Last 3 Encounters:  06/15/24 127/83  06/06/24 128/80  06/03/24 133/83     Physical Exam Constitutional:      General: He is not in acute distress.    Appearance: Normal appearance. He is cachectic. He is ill-appearing.  HENT:     Mouth/Throat:     Mouth: Mucous membranes are dry.  Cardiovascular:     Rate and Rhythm: Normal rate and regular rhythm.     Pulses: Normal pulses.     Heart sounds: Normal heart sounds.  Pulmonary:     Effort: Pulmonary effort is  normal.     Breath sounds: Normal breath sounds.  Neurological:     Mental Status: He is alert.  Psychiatric:        Mood and Affect: Mood normal.        Behavior: Behavior normal.     Assessment & Plan:   1. Pre-operative clearance (Primary) Gallstone disease with biliary stent. Scheduled for cholecystectomy on February 10th. His METS score is less than 4. Concerns about post-operative  recovery and dietary restrictions. In relation to his upcoming surgery, he is medically stable and medically cleared for this procedure pending cardiac clearance from cardiology. His appointment with cardiology is scheduled for tomorrow, 06/16/24, at 11:20AM.   2. Controlled type 2 diabetes mellitus without complication, without long-term current use of insulin  (HCC) Well-controlled with last A1c at 6.6% in December 2025. Currently taking metformin  1000mg  BID. Refills sent to pharmacy on file.  - TSH Rfx on Abnormal to Free T4 - metFORMIN  (GLUCOPHAGE -XR) 500 MG 24 hr tablet; Take 2 tablets (1,000 mg total) by mouth 2 (two) times daily with a meal.  Dispense: 120 tablet; Refill: 3  3. Dyspnea on exertion Patient has a history of diastolic dysfunction. June 2024 echo completed with EF 55-60% with mild MR/AI. Intermittent dyspnea on exertion. Complex medical history- may be cardiac or pulmonary in etiology. Will obtain BNP today. Reports albuterol  inhaler not effective per patient. He does report he is only  - B Nat Peptide  4. History of non-ST elevation myocardial infarction (NSTEMI) Continue metoprolol  and Imdur . He reports that he has not needed to use nitroglycerin  recently but reports his prescription is out of date. Refills provided to patient. Continue lifelong dual antiplatelet therapy as tolerated.  - metoprolol  tartrate (LOPRESSOR ) 50 MG tablet; Take 1 tablet (50 mg total) by mouth 2 (two) times daily.  Dispense: 60 tablet; Refill: 5 - nitroGLYCERIN  (NITROSTAT ) 0.4 MG SL tablet; Place 1 tablet (0.4 mg  total) under the tongue every 5 (five) minutes as needed for chest pain.  Dispense: 25 tablet; Refill: 1  5. Hyperlipidemia LDL goal <70 Well controlled lipid panel done about two weeks ago. Refills sent to pharmacy on file.  - rosuvastatin  (CRESTOR ) 40 MG tablet; Take 1 tablet by mouth once daily  Dispense: 30 tablet; Refill: 6  6. Essential hypertension Blood pressure well controlled in office today. No refills needed today. Will obtain repeat BMP.  - Basic Metabolic Panel (BMET)  7. Anemia, unspecified type Reports feeling pale and experiencing dizziness. Recent CBC while in the hospital showed Hgb 12.1 and Hct 35.6. Ordered blood work to assess blood count prior to surgery.  - CBC with Differential/Platelet  8. Primary insomnia Difficulty sleeping due to pain and discomfort. Trazodone  not providing adequate relief. Increase trazodone  to 50 mg at night. - traZODone  (DESYREL ) 50 MG tablet; Take 1 tablet (50 mg total) by mouth at bedtime.  Dispense: 30 tablet; Refill: 2  9. Vertigo Refill needed for meclizine . Rx sent to pharmacy on file. May be reasonable to try vestibular physical therapy after he recovers from his gallbladder complications and surgery.  - Meclizine  HCl 25 MG CHEW; Chew 2 tablets (50 mg total) by mouth 2 (two) times daily as needed.  Dispense: 120 tablet; Refill: 2  10. Gastroesophageal reflux disease, unspecified whether esophagitis present Reports stomach pain and discomfort after eating, possibly related to gallstone disease. Refill needed for Protonix . Rx sent to pharmacy on file.  - pantoprazole  (PROTONIX ) 20 MG tablet; Take 2 tablets (40 mg total) by mouth daily.  Dispense: 60 tablet; Refill: 3  11. Arteriosclerotic cardiovascular disease (ASCVD) Continue lifelong dual antiplatelet therapy as tolerated- per previous cardiology note. - clopidogrel  (PLAVIX ) 75 MG tablet; Take 1 tablet (75 mg total) by mouth daily.  Dispense: 30 tablet; Refill: 3   12. Tobacco  abuse Patient reports he has quit smoking but does partake occasionally.   Patient to reach out to office if new, worrisome, or unresolved symptoms arise or if  no improvement in patient's condition. Patient verbalized understanding and is agreeable to treatment plan. All questions answered to patient's satisfaction.    Return in about 8 weeks (around 08/10/2024) for chronic conditions .    Evalene Arts, FNP "

## 2024-06-16 ENCOUNTER — Ambulatory Visit: Payer: Self-pay | Admitting: Physician Assistant

## 2024-06-16 ENCOUNTER — Telehealth (HOSPITAL_BASED_OUTPATIENT_CLINIC_OR_DEPARTMENT_OTHER): Payer: Self-pay | Admitting: *Deleted

## 2024-06-16 DIAGNOSIS — Z720 Tobacco use: Secondary | ICD-10-CM

## 2024-06-16 DIAGNOSIS — Z01810 Encounter for preprocedural cardiovascular examination: Secondary | ICD-10-CM

## 2024-06-16 DIAGNOSIS — I25118 Atherosclerotic heart disease of native coronary artery with other forms of angina pectoris: Secondary | ICD-10-CM

## 2024-06-16 DIAGNOSIS — Z951 Presence of aortocoronary bypass graft: Secondary | ICD-10-CM

## 2024-06-16 DIAGNOSIS — I10 Essential (primary) hypertension: Secondary | ICD-10-CM

## 2024-06-16 DIAGNOSIS — E785 Hyperlipidemia, unspecified: Secondary | ICD-10-CM

## 2024-06-16 NOTE — Telephone Encounter (Signed)
 Preoperative team,  Please reach out to patient and try to reschedule office appointment.  Thank you for your help.  Jeffery Sweeney. Briggette Najarian NP-C     06/16/2024, 11:43 AM [facility] [address] [place], [place] [address] Office [phone]

## 2024-06-16 NOTE — Telephone Encounter (Signed)
"  ° °  Pre-operative Risk Assessment    Patient Name: Jeffery Sweeney  DOB: Oct 05, 1960 MRN: [id number]   Date of last office visit: 06/16/23 DR. ARIDA Date of next office visit: 06/16/24 RYAN DUNN, [facility]   Request for Surgical Clearance    Procedure:  ERCP  Date of Surgery:  Clearance 07/11/24                                Surgeon: NOT LISTED  Surgeon's Group or Practice Name:  [facility] Phone number:  [phone] Fax number:  [phone]   Type of Clearance Requested:   - Medical  - Pharmacy:  Hold Clopidogrel  (Plavix )     Type of Anesthesia:  General    Additional requests/questions:    Jeffery Sweeney   06/16/2024, 11:31 AM   "

## 2024-06-16 NOTE — Progress Notes (Signed)
 "  Cardiology Office Note    Date:  06/17/2024   ID:  Jeffery Sweeney, DOB 1960/07/10, MRN [id number]  PCP:  Towana Small, FNP  Cardiologist:  Deatrice Cage, MD  Electrophysiologist:  None   Chief Complaint: Preprocedure cardiac risk stratification  History of Present Illness:   Jeffery Sweeney is a 64 y.o. male with history of CAD status post prior PCI in 2019 in [place]  status post three-vessel CABG in 2022 s/p complex PCI in 2023, HTN, HLD, prior CVA noted on imaging in 2023, ascending cholangitis complicated by Klebsiella pneumonia bacteremia and acute pancreatitis, and tobacco use who presents for preprocedure cardiac risk stratification for upcoming cholecystectomy on 06/28/2024 and ERCP on 07/11/2024 in the setting of recent ascending cholangitis.   Cardiac history dates back to 2019, when he underwent PCI, reportedly x 3, in [place] .  In 2/022, he was admitted to [facility] with an non-STEMI. LHC showed severe three-vessel CAD.  Echo showed normal LV function.  He was transferred to [facility] and underwent CABG x 3 with LIMA to LAD, SVG to RPDA, and SVG to RI.  In 09/2020, he was readmitted with atypical chest pain.  A stress test was performed in the outpatient setting in 11/2020 and was normal.   In 06/2021, he was admitted for TIA.  Echo showed an EF of 60 to 65% with grade 1 diastolic dysfunction and mild mitral regurgitation.  In 09/2021 he was seen in the office following an episode of chest pain.  Decision was made to pursue LHC, which showed severe multivessel CAD as well as 80% ostial stenosis in the LIMA to the LAD with a hazy appearance.  When the LIMA was engaged, there was acute closure of the graft and aspiration thrombectomy and drug-eluting stent placement was performed.  He was placed on tirofiban  postprocedure and admitted but approximately 6 hours later, he developed recurrent chest pain with anterior ST segment elevation requiring emergent LHC with finding of  abrupt closure of the LIMA to the LAD with evidence of a spiral dissection to the distal anastomosis.  He required placement of 7 overlapping drug-eluting stents.  He was maintained on tirofiban  for 18 hours postprocedure.   He had recurrent atypical chest pain in 03/2022 and underwent stress testing which was negative for ischemia with normal LV function.  Most recent echo in 10/2022 showed EF of 55 to 60% with grade 1 diastolic dysfunction, normal RV size/function, mild MR/AI, and aortic sclerosis without stenosis.   He was last seen in cardiology clinic in 05/2023, at which time he noted intermittent exertional dyspnea and chest/sternal soreness.  EKG was unchanged.  Symptoms were overall felt to be stable, dating back to his normal stress test, and he was medically managed.   More recently, he was admitted to the hospital in 04/2024 with acute cholecystitis status post percutaneous cholecystectomy drain placement.  High-sensitivity troponin negative.  He was admitted to the hospital in early 05/2024 secondary to ascending cholangitis/choledocholithiasis, undergoing ERCP with inability to cannulate the bile duct while stone did, help with manipulation of the ampulla post transhepatic cholangiogram with biliary drain placement with admission complicated by Klebsiella pneumonia bacteremia and Aeromonas bacteremia.  High-sensitivity troponin mildly elevated at 37.  He is scheduled to undergo cholecystectomy on 06/28/2024 and repeat ERCP on 07/11/2024.   He comes in doing well from a cardiac perspective and is without symptoms of angina or cardiac decompensation.  Prior to his acute illness, he was active without cardiac limitation, at  times helping people move.  No dizziness, presyncope, or syncope.  No lower extremity swelling or progressive orthopnea.  No falls or symptoms concerning for bleeding.  Since his acute illness he has been fatigued, though is without symptoms of cardiac decompensation.    DASI: >  4 METs (prior to acute illness) without cardiac limitation  RCRI: 2 points (history of ischemic heart disease and history of CVA) with an estimated rate of major adverse cardiac event of 6.6%      Labs independently reviewed: 05/2024 - Hgb 12.1, PLT 155, potassium 3.7, BUN 11, serum creatinine 0.72, TC 51, TG 113, HDL 14, LDL 15, albumin  3.0, AST/ALT normal, magnesium  1.5 04/2024 - A1c 6.6 11/2020 - TSH normal   Past Medical History:  Diagnosis Date   CAD (coronary artery disease)    a. PCI x 3 - [place] ; b. 06/2020 NSTEMI-->CABG x 3 (LIMA->LAD, VG->RI, VG->RPDA); c. 09/2021 STEMI/PCI: LM nl, LAD 80p/m, D1 90, lat D1 90, D2 80, LCX 52m/d, OM2 80, RCA 55m, 30d, RPDA 90, VG->prox RCA->RPDA 30p, VG->D1 mild dzs, LIMA->LAD 80 (DES)-->complicated by acute closure-->DES x 7; d. 03/2022 MV: No isch/infarct, EF 54%.   CVA (cerebral vascular accident) (HCC) 06/2021   Small acute/subacute infarcts in the left occipital lobe and   Diastolic dysfunction    a. 10/2022 Echo: EF 55-60%, no rwma, GrI DD, nl RV size/fxn, mild MR/AI.   Hyperlipidemia LDL goal <70    started on Repatha  09/26/2021, continue Crestor  40mg  daily   Hypertension    Multiple vessel coronary artery disease    Myocardial infarct (HCC)    x 3; PCI with single stent placement for each (in [place] )   NSTEMI (non-ST elevated myocardial infarction) (HCC) 06/2020   STEMI (ST elevation myocardial infarction) (HCC) 09/23/2021   LHC 09/23/2021 x 2   Tobacco use    2 cigarettes daily   Unstable angina (HCC)    LHC 09/23/2021 x 2; total 8 DES to LIMA    Past Surgical History:  Procedure Laterality Date   CORONARY ANGIOPLASTY WITH STENT PLACEMENT     in [place]  in 2018-19   CORONARY ARTERY BYPASS GRAFT N/A 07/09/2020   Procedure: CORONARY ARTERY BYPASS GRAFTING (CABG) x THREE , USING LEFT INTERNAL MAMMARY ARTERY, AND RIGHT LEG GREATER SAPHENOUS VEIN HARVESTED ENDOSCOPICALLY;  Surgeon: Army Dallas NOVAK, MD;  Location: [facility];   Service: Open Heart Surgery;  Laterality: N/A;   CORONARY PRESSURE/FFR STUDY N/A 07/06/2020   Procedure: INTRAVASCULAR PRESSURE WIRE/FFR STUDY;  Surgeon: Darron Deatrice LABOR, MD;  Location: [facility] INVASIVE CV LAB;  Service: Cardiovascular;  Laterality: N/A;  LAD   CORONARY STENT INTERVENTION N/A 09/23/2021   Procedure: CORONARY STENT INTERVENTION;  Surgeon: Darron Deatrice LABOR, MD;  Location: [facility] INVASIVE CV LAB;  Service: Cardiovascular;  Laterality: N/A;   CORONARY/GRAFT ACUTE MI REVASCULARIZATION N/A 09/23/2021   Procedure: Coronary/Graft Acute MI Revascularization;  Surgeon: Darron Deatrice LABOR, MD;  Location: [facility] INVASIVE CV LAB;  Service: Cardiovascular;  Laterality: N/A;   ERCP N/A 05/25/2024   Procedure: ERCP, WITH INTERVENTION IF INDICATED;  Surgeon: Jinny Carmine, MD;  Location: [facility] ENDOSCOPY;  Service: Endoscopy;  Laterality: N/A;   IR EXCHANGE BILIARY DRAIN  05/27/2024   IR INT EXT BILIARY DRAIN WITH CHOLANGIOGRAM  05/27/2024   IR PERC CHOLECYSTOSTOMY  04/29/2024   LEFT HEART CATH AND CORONARY ANGIOGRAPHY N/A 07/06/2020   Procedure: LEFT HEART CATH AND CORONARY ANGIOGRAPHY;  Surgeon: Darron Deatrice LABOR, MD;  Location: [facility] INVASIVE CV LAB;  Service: Cardiovascular;  Laterality: N/A;   LEFT HEART CATH AND CORONARY ANGIOGRAPHY N/A 09/23/2021   Procedure: LEFT HEART CATH AND CORONARY ANGIOGRAPHY;  Surgeon: Darron Deatrice LABOR, MD;  Location: [facility] INVASIVE CV LAB;  Service: Cardiovascular;  Laterality: N/A;   LEFT HEART CATH AND CORS/GRAFTS ANGIOGRAPHY N/A 09/23/2021   Procedure: LEFT HEART CATH AND CORS/GRAFTS ANGIOGRAPHY;  Surgeon: Darron Deatrice LABOR, MD;  Location: [facility] INVASIVE CV LAB;  Service: Cardiovascular;  Laterality: N/A;   TEE WITHOUT CARDIOVERSION N/A 07/09/2020   Procedure: TRANSESOPHAGEAL ECHOCARDIOGRAM (TEE);  Surgeon: Army Dallas NOVAK, MD;  Location[facility] OR;  Service: Open Heart Surgery;  Laterality: N/A;   WRIST SURGERY Left    for cyst    Current Medications: Active  Medications[1]  Allergies:   Patient has no known allergies.   Social History   Socioeconomic History   Marital status: Divorced    Spouse name: Not on file   Number of children: Not on file   Years of education: Not on file   Highest education level: Not on file  Occupational History   Not on file  Tobacco Use   Smoking status: Former    Current packs/day: 0.00    Average packs/day: 0.5 packs/day    Types: Cigarettes    Quit date: 07/06/2020    Years since quitting: 3.9    Passive exposure: Past   Smokeless tobacco: Never   Tobacco comments:    Quit 06/03/24  Vaping Use   Vaping status: Never Used  Substance and Sexual Activity   Alcohol use: Not Currently   Drug use: Not Currently   Sexual activity: Not Currently  Other Topics Concern   Not on file  Social History Narrative   Not on file   Social Drivers of Health   Tobacco Use: Medium Risk (06/17/2024)   Patient History    Smoking Tobacco Use: Former    Smokeless Tobacco Use: Never    Passive Exposure: Past  Physicist, Medical Strain: High Risk (06/07/2024)   Overall Financial Resource Strain (CARDIA)    Difficulty of Paying Living Expenses: Hard  Food Insecurity: Food Insecurity Present (06/07/2024)   Epic    Worried About Programme Researcher, Broadcasting/film/video in the Last Year: Often true    Ran Out of Food in the Last Year: Often true  Transportation Needs: No Transportation Needs (06/07/2024)   Epic    Lack of Transportation (Medical): No    Lack of Transportation (Non-Medical): No  Physical Activity: Not on file  Stress: Not on file  Social Connections: Patient Declined (04/28/2024)   Social Connection and Isolation Panel    Frequency of Communication with Friends and Family: Patient declined    Frequency of Social Gatherings with Friends and Family: Patient declined    Attends Religious Services: Patient declined    Database Administrator or Organizations: Patient declined    Attends Banker Meetings:  Patient declined    Marital Status: Patient declined  Depression (PHQ2-9): Low Risk (06/14/2024)   Depression (PHQ2-9)    PHQ-2 Score: 1  Alcohol Screen: Not on file  Housing: Unknown (06/07/2024)   Epic    Unable to Pay for Housing in the Last Year: No    Number of Times Moved in the Last Year: Not on file    Homeless in the Last Year: No  Utilities: Not At Risk (06/07/2024)   Epic    Threatened with loss of utilities: No  Health Literacy: Not on file     Family  History:  The patient's family history includes Diabetes Mellitus II in his father.  ROS:   12-point review of systems is negative unless otherwise noted in the HPI.   EKGs/Labs/Other Studies Reviewed:    Studies reviewed were summarized above. The additional studies were reviewed today:  2D echo 11/13/2022: 1. Left ventricular ejection fraction, by estimation, is 55 to 60%. The  left ventricle has normal function. The left ventricle has no regional  wall motion abnormalities. Left ventricular diastolic parameters are  consistent with Grade I diastolic  dysfunction (impaired relaxation). The average left ventricular global  longitudinal strain is -17.0 %.   2. Right ventricular systolic function is normal. The right ventricular  size is normal. Tricuspid regurgitation signal is inadequate for assessing  PA pressure.   3. The mitral valve is normal in structure. Mild mitral valve  regurgitation. No evidence of mitral stenosis.   4. The aortic valve has an indeterminant number of cusps. Aortic valve  regurgitation is mild. Aortic valve sclerosis is present, with no evidence  of aortic valve stenosis.   5. The inferior vena cava is normal in size with greater than 50%  respiratory variability, suggesting right atrial pressure of 3 mmHg.  __________   Lexiscan  MPI 04/09/2022:   The study is low risk.   No ST deviation was noted.   LV perfusion is normal.   Left ventricular function is normal. calculated LVEF is  54%   Patient is s/p cabg x 3. __________   See CV studies in Epic for more remote imaging    EKG:  EKG is ordered today.  The EKG ordered today demonstrates NSR, 83 bpm, baseline wandering along the lateral lead, no acute ST-T changes  Recent Labs: 11/30/2023: B Natriuretic Peptide 42.4 05/28/2024: Magnesium  1.5 05/29/2024: ALT 44 05/31/2024: BUN 11; Creatinine, Ser 0.72; Potassium 3.7; Sodium 135 06/01/2024: Hemoglobin 12.1; Platelets 155  Recent Lipid Panel    Component Value Date/Time   CHOL 51 05/31/2024 0401   CHOL 143 06/16/2023 1019   TRIG 113 05/31/2024 0401   HDL 14 (L) 05/31/2024 0401   HDL 32 (L) 06/16/2023 1019   CHOLHDL 3.7 05/31/2024 0401   VLDL 23 05/31/2024 0401   LDLCALC 15 05/31/2024 0401   LDLCALC 75 06/16/2023 1019    PHYSICAL EXAM:    VS:  BP 132/62 (BP Location: Left Arm, Patient Position: Sitting, Cuff Size: Normal)   Pulse 83   Ht 5' 3 (1.6 m)   Wt 163 lb (73.9 kg)   SpO2 97%   BMI 28.87 kg/m   BMI: Body mass index is 28.87 kg/m.  Physical Exam Constitutional:      Appearance: He is well-developed.  HENT:     Head: Normocephalic and atraumatic.  Eyes:     General:        Right eye: No discharge.        Left eye: No discharge.  Cardiovascular:     Rate and Rhythm: Normal rate and regular rhythm.     Heart sounds: Normal heart sounds, S1 normal and S2 normal. Heart sounds not distant. No midsystolic click and no opening snap. No murmur heard.    No friction rub.  Pulmonary:     Effort: Pulmonary effort is normal. No respiratory distress.     Breath sounds: Normal breath sounds. No decreased breath sounds, wheezing, rhonchi or rales.  Musculoskeletal:     Cervical back: Normal range of motion.     Right lower leg: No edema.  Left lower leg: No edema.  Skin:    General: Skin is warm and dry.     Nails: There is no clubbing.  Neurological:     Mental Status: He is alert and oriented to person, place, and time.  Psychiatric:         Speech: Speech normal.        Behavior: Behavior normal.        Thought Content: Thought content normal.        Judgment: Judgment normal.     Wt Readings from Last 3 Encounters:  06/17/24 163 lb (73.9 kg)  06/15/24 163 lb (73.9 kg)  05/24/24 178 lb 9.2 oz (81 kg)     ASSESSMENT & PLAN:   Preoperative cardiac risk stratification: In the setting of recent acute cholecystitis with ascending cholangitis and choledocholithiasis status post ERCP and percutaneous drain placement, he is scheduled to undergo cholecystectomy on 06/28/2024 and ERCP on 07/11/2024.  He is without symptoms of angina or cardiac decompensation.  Prior to his acute noncardiac illness, he was able to achieve greater than 4 METs without cardiac limitation.  Per DASI, he has a 6.6% estimated rate of major cardiac adverse event in the periprocedural timeframe given history of ischemic heart disease and prior CVA.  EKG is without ischemic changes.  He may proceed with needed noncardiac procedures without further cardiac testing.  Hold clopidogrel  for 5 days prior to each procedure with recommendation to resume clopidogrel  as soon as safely possible after procedures.  He should remain on aspirin  81 mg daily throughout the entire periprocedural timeframe unless there is significant or evidence of increased bleeding.  CAD status post CABG without angina: He is doing well and without symptoms of angina or cardiac decompensation.  He will be maintained on DAPT with aspirin  81 mg and clopidogrel  75 mg indefinitely, as long as tolerated, with noted brief interruption of dual antiplatelet therapy as outlined above for periprocedural need.  He will otherwise continue aggressive risk factor modification and secondary prevention including Imdur  60 mg twice daily, Lopressor  50 mg twice daily, and rosuvastatin  40 mg.  No indication for further ischemic testing at this time.  HTN: Blood pressure is well-controlled in the office today.  He remains on  Imdur  60 mg twice daily and Lopressor  50 mg twice daily.  HLD: LDL 15 in 05/2024.  Remains on rosuvastatin  40 mg.  Tobacco use: Abstaining.    Disposition: F/u with Dr. Darron in 6 months.   Medication Adjustments/Labs and Tests Ordered: Current medicines are reviewed at length with the patient today.  Concerns regarding medicines are outlined above. Medication changes, Labs and Tests ordered today are summarized above and listed in the Patient Instructions accessible in Encounters.   Signed, Bernardino Bring, PA-C 06/17/2024 4:07 PM     [facility] [place] [address] [place], [place] [address] [phone]     [1]  Current Meds  Medication Sig   albuterol  (VENTOLIN  HFA) 108 (90 Base) MCG/ACT inhaler Inhale 2 puffs into the lungs every 6 (six) hours as needed for wheezing or shortness of breath.   aspirin  81 MG chewable tablet Chew 1 tablet (81 mg total) by mouth daily.   clopidogrel  (PLAVIX ) 75 MG tablet Take 1 tablet (75 mg total) by mouth daily.   isosorbide  mononitrate (IMDUR ) 30 MG 24 hr tablet Take 2 tablets (60 mg total) by mouth 2 (two) times daily.   Meclizine  HCl 25 MG CHEW Chew 2 tablets (50  mg total) by mouth 2 (two) times daily as needed.   metFORMIN  (GLUCOPHAGE -XR) 500 MG 24 hr tablet Take 2 tablets (1,000 mg total) by mouth 2 (two) times daily with a meal.   metoprolol  tartrate (LOPRESSOR ) 50 MG tablet Take 1 tablet (50 mg total) by mouth 2 (two) times daily.   nitroGLYCERIN  (NITROSTAT ) 0.4 MG SL tablet Place 1 tablet (0.4 mg total) under the tongue every 5 (five) minutes as needed for chest pain.   pantoprazole  (PROTONIX ) 20 MG tablet Take 2 tablets (40 mg total) by mouth daily.   rosuvastatin  (CRESTOR ) 40 MG tablet Take 1 tablet by mouth once daily   sodium chloride  flush 0.9 % SOLN injection 5 mLs by Intracatheter route daily.   sodium chloride  flush 0.9 % SOLN injection 5 mLs by Intracatheter route daily. Flush cholecystostomy tube daily  with 5 ccs NS   sodium chloride  flush 0.9 % SOLN injection 10 mLs by Intracatheter route daily. Flush EACH catheter with 5-10mL daily   traZODone  (DESYREL ) 50 MG tablet Take 1 tablet (50 mg total) by mouth at bedtime.   "

## 2024-06-16 NOTE — Telephone Encounter (Signed)
 Pt was to see Bernardino Bring, [facility] today for preop clearance for procedure 06/28/24. Pt no showed for appt today per PAC. We also received a 2nd request for separate procedure to be done on 07/11/24. Pt said he could not get out due to weather. I explained to the pt that he is needing cardiac clearance before he can proceed with either procedure. Pt agrees to appt tomorrow 06/17/24 @ 2:45 Bernardino Bring, PAC. I will update all parties involved.

## 2024-06-16 NOTE — Telephone Encounter (Signed)
 Patient was a no-show for preop appointment.  He has 2 pending procedures.

## 2024-06-17 ENCOUNTER — Ambulatory Visit: Payer: Self-pay | Attending: Physician Assistant | Admitting: Physician Assistant

## 2024-06-17 ENCOUNTER — Encounter: Payer: Self-pay | Admitting: Physician Assistant

## 2024-06-17 VITALS — BP 132/62 | HR 83 | Ht 63.0 in | Wt 163.0 lb

## 2024-06-17 DIAGNOSIS — Z720 Tobacco use: Secondary | ICD-10-CM

## 2024-06-17 DIAGNOSIS — I25118 Atherosclerotic heart disease of native coronary artery with other forms of angina pectoris: Secondary | ICD-10-CM

## 2024-06-17 DIAGNOSIS — I2510 Atherosclerotic heart disease of native coronary artery without angina pectoris: Secondary | ICD-10-CM

## 2024-06-17 DIAGNOSIS — E785 Hyperlipidemia, unspecified: Secondary | ICD-10-CM

## 2024-06-17 DIAGNOSIS — Z951 Presence of aortocoronary bypass graft: Secondary | ICD-10-CM

## 2024-06-17 DIAGNOSIS — Z01810 Encounter for preprocedural cardiovascular examination: Secondary | ICD-10-CM

## 2024-06-17 DIAGNOSIS — I10 Essential (primary) hypertension: Secondary | ICD-10-CM

## 2024-06-17 NOTE — Patient Instructions (Signed)
 Medication Instructions:  Your physician recommends the following medication changes.  STOP TAKING or HOLD: Plavix  5 days prior to your gallbladder surgery and ERCP -- Please restart Plavix  after your surgeon/internist directs   *If you need a refill on your cardiac medications before your next appointment, please call your pharmacy*  Lab Work: None ordered at this time  If you have labs (blood work) drawn today and your tests are completely normal, you will receive your results only by: MyChart Message (if you have MyChart) OR A paper copy in the mail If you have any lab test that is abnormal or we need to change your treatment, we will call you to review the results.  Testing/Procedures: None ordered at this time   Follow-Up: At [facility], you and your health needs are our priority.  As part of our continuing mission to provide you with exceptional heart care, our providers are all part of one team.  This team includes your primary Cardiologist (physician) and Advanced Practice Providers or APPs (Physician Assistants and Nurse Practitioners) who all work together to provide you with the care you need, when you need it.  Your next appointment:   6 month(s)  Provider:   You may see Deatrice Cage, MD   We recommend signing up for the patient portal called MyChart.  Sign up information is provided on this After Visit Summary.  MyChart is used to connect with patients for Virtual Visits (Telemedicine).  Patients are able to view lab/test results, encounter notes, upcoming appointments, etc.  Non-urgent messages can be sent to your provider as well.   To learn more about what you can do with MyChart, go to [email or web address].

## 2024-06-20 ENCOUNTER — Telehealth: Payer: Self-pay

## 2024-06-20 NOTE — Patient Instructions (Signed)
 Jeffery Sweeney - I am sorry I was unable to reach you today for our scheduled appointment. I work with Towana Small, FNP and am calling to support your healthcare needs. Please contact me at [phone] at your earliest convenience. I look forward to speaking with you soon.   Thank you,  Orlean Fey, BSW [place]  [facility] Social Worker, [employer] [phone]

## 2024-06-21 ENCOUNTER — Other Ambulatory Visit: Payer: Self-pay

## 2024-06-21 ENCOUNTER — Telehealth: Payer: Self-pay

## 2024-06-21 ENCOUNTER — Inpatient Hospital Stay
Admission: RE | Admit: 2024-06-21 | Discharge: 2024-06-21 | Disposition: A | Payer: Self-pay | Source: Ambulatory Visit | Attending: Surgery

## 2024-06-21 VITALS — Ht 63.0 in | Wt 163.0 lb

## 2024-06-21 DIAGNOSIS — E119 Type 2 diabetes mellitus without complications: Secondary | ICD-10-CM

## 2024-06-21 NOTE — Telephone Encounter (Signed)
 EKG is without ischemic changes. He may proceed with needed noncardiac procedures without further cardiac testing. Hold clopidogrel  for 5 days prior to each procedure with recommendation to resume clopidogrel  as soon as safely possible after procedures. He should remain on aspirin  81 mg daily throughout the entire periprocedural timeframe unless there is significant or evidence of increased bleeding.   Signed, Bernardino Bring, PA-C 06/17/2024 4:07 PM     [facility] [place] [address] [place], [place] [address] [phone]

## 2024-06-22 ENCOUNTER — Other Ambulatory Visit: Payer: Self-pay

## 2024-06-22 ENCOUNTER — Encounter: Payer: Self-pay | Admitting: Surgery

## 2024-06-22 ENCOUNTER — Telehealth: Payer: Self-pay

## 2024-06-22 ENCOUNTER — Telehealth: Payer: Self-pay | Admitting: Surgery

## 2024-06-22 ENCOUNTER — Ambulatory Visit: Payer: Self-pay | Admitting: Surgery

## 2024-06-22 ENCOUNTER — Telehealth: Payer: Self-pay | Admitting: Family Medicine

## 2024-06-22 VITALS — BP 103/73 | HR 96 | Temp 97.8°F | Ht 63.0 in | Wt 156.4 lb

## 2024-06-22 DIAGNOSIS — K822 Perforation of gallbladder: Secondary | ICD-10-CM

## 2024-06-22 DIAGNOSIS — K851 Biliary acute pancreatitis without necrosis or infection: Secondary | ICD-10-CM

## 2024-06-22 DIAGNOSIS — K8000 Calculus of gallbladder with acute cholecystitis without obstruction: Secondary | ICD-10-CM

## 2024-06-22 DIAGNOSIS — K810 Acute cholecystitis: Secondary | ICD-10-CM

## 2024-06-22 NOTE — Telephone Encounter (Signed)
 Copied from CRM #[id number]. Topic: Referral - Question >> Jun 22, 2024 10:27 AM [name] wrote: Reason for CRM: Patient was told during a recent visit - can't remove gall bladder - had the appt today at [facility] - - they are going to send a referral to [place] -  Can we followup?

## 2024-06-22 NOTE — Patient Instructions (Signed)
 Gallstones (Cholelithiasis): What to Know  Cholelithiasis happens when gallstones form in the gallbladder. The gallbladder stores bile. Bile is a fluid that helps digest fats. Bile can harden and form into gallstones. If they cause a blockage, they can cause pain (gallbladder attack). What are the causes? This condition may be caused by: Too much bilirubin in the bile. This happens if you have sickle cell anemia. Too much of a fat-like substance (cholesterol) in your bile. Not enough bile salts in your bile. These salts help the body absorb and digest fats. The gallbladder not emptying fully or often enough. This is common in pregnant women. What increases the risk? The following factors may make you more likely to develop this condition: Being older than age 52. Eating a lot of fried foods, fat, and refined carbs (refined carbohydrates). Being male. Being pregnant many times. Using medicines with male hormones in them for a long time. Losing weight fast. Having gallstones in your family. Having health problems, such as diabetes, obesity, Crohn's disease, or liver disease. What are the signs or symptoms? Often, there may be gallstones but no symptoms. These gallstones are called silent gallstones. If a gallstone causes a blockage, you may get sudden pain. The pain: Can be in the upper right part of your belly (abdomen). Normally comes at night or after you eat. Can last an hour or more. Can spread to your right shoulder, back, or chest. Can feel like discomfort, burning, or fullness in the upper part of your belly (indigestion). If the blockage lasts more than a few hours, you can get an infection or swelling. You may: Vomit or feel like you may vomit (nauseous). Feel bloated. Have belly pain for 5 hours or more. Feel tender in your belly, often in the upper right part and under your ribs. Have a fever or chills. Have skin or the white parts of your eyes turn yellow  (jaundice). Have dark pee (urine) or pale poop (stool). How is this treated? Treatment for this condition depends on how bad you feel. If you have symptoms, you may need: Home care, if symptoms are not very bad. Do not eat for 12-24 hours. Drink only water and clear liquids. After 1 or 2 days, start to eat simple or clear foods. Try broth and crackers. You may need medicines for pain or stomach upset or both. If you have an infection, you will need antibiotics. A hospital stay, if you have very bad pain or a very bad infection. Surgery to remove your gallbladder. You may need this if: Gallstones keep coming back. You have very bad symptoms. Medicines to break up gallstones. Medicines may be used for 6-12 months. A procedure to find and take out gallstones or to break up gallstones. Follow these instructions at home: Medicines Take over-the-counter and prescription medicines only as told by your doctor. If you were prescribed antibiotics, take them as told by your doctor. Do not stop taking them even if you start to feel better. Ask your doctor if you should avoid driving or using machines while you are taking your medicine. Eating and drinking Drink enough fluid to keep your pee pale yellow. Drink water or clear fluids. This is important when you have pain. Eat healthy foods. Choose: Fewer fatty foods, such as fried foods. Fewer refined carbs. Avoid breads and grains that are highly processed, such as white bread and white rice. Choose whole grains, such as whole-wheat bread and brown rice. More fiber. Almonds, fresh fruit, and beans  are healthy sources. General instructions Keep a healthy weight. Keep all follow-up visits. You may need to see a specialist or a careers adviser. Where to find more information [employer] of Diabetes and Digestive and Kidney Diseases: [email or web address] Contact a doctor if: You have sudden pain in the upper right part of your belly. Pain might spread to your  right shoulder, back, or chest. Your pain lasts more than 2 hours. You have been diagnosed with gallstones that have no symptoms and you get: Belly pain. Discomfort, burning, or fullness in the upper part of your abdomen. You keep feeling like you may vomit. You have dark pee or pale poop. Get help right away if: You have pain in your abdomen, that: Lasts more than 5 hours. Keeps getting worse. You have a fever or chills. You can't stop vomiting. Your skin or the white parts of your eyes turn yellow. This information is not intended to replace advice given to you by your health care provider. Make sure you discuss any questions you have with your health care provider. Document Revised: 03/11/2024 Document Reviewed: 02/17/2022 Elsevier Patient Education  2025 [employer].

## 2024-06-22 NOTE — Telephone Encounter (Signed)
 No Vm set up

## 2024-06-22 NOTE — Telephone Encounter (Signed)
 06/22/24: Per Dr. Desiderio cancel surgery scheduled for 06/28/24. Patient being referred to [facility].

## 2024-06-22 NOTE — Progress Notes (Signed)
 " 06/22/2024  History of Present Illness: Jeffery Sweeney is a 64 y.o. male presenting for follow-up of acute cholecystitis.  The patient was initially admitted on 04/27/2024 with acute cholecystitis.  He had a percutaneous cholecystostomy tube placed by interventional radiology on 04/29/2024.  He was originally scheduled for cholecystectomy on 06/28/2024 and he presented today for H&P update.  He was cleared by cardiology for surgery and was okayed to stop his Plavix  for 5 days prior to surgery.  The patient was readmitted on 05/24/2024 with gallstone pancreatitis and choledocholithiasis noted on imaging.  ERCP was attempted but unsuccessful to cannulate the ampulla.  Interventional radiology attempted cholangiogram through the existing cholecystostomy tube but while doing so, had an iatrogenic perforation of the gallbladder at the neck as seen on imaging with contrast extravasation.  A new cholecystostomy tube was exchanged and the patient then had a PTC drain placed to help with his choledocholithiasis.  The patient also had bacteremia with Klebsiella likely due to his choledocholithiasis and was initially on cefepime  and was transitioned to Levaquin .  The patient reports that he was in the ICU for some time and was eventually discharged on 06/03/2024.  Today, the patient reports that he is having soreness in the right upper quadrant where the drains are located but otherwise has been doing well.  He still feels a little bit weak and has lost a lot of weight from his most recent hospitalization.  Past Medical History: Past Medical History:  Diagnosis Date   CAD (coronary artery disease)    a. PCI x 3 - [place] ; b. 06/2020 NSTEMI-->CABG x 3 (LIMA->LAD, VG->RI, VG->RPDA); c. 09/2021 STEMI/PCI: LM nl, LAD 80p/m, D1 90, lat D1 90, D2 80, LCX 25m/d, OM2 80, RCA 68m, 30d, RPDA 90, VG->prox RCA->RPDA 30p, VG->D1 mild dzs, LIMA->LAD 80 (DES)-->complicated by acute closure-->DES x 7; d. 03/2022 MV: No  isch/infarct, EF 54%.   CVA (cerebral vascular accident) (HCC) 06/2021   Small acute/subacute infarcts in the left occipital lobe and   Diastolic dysfunction    a. 10/2022 Echo: EF 55-60%, no rwma, GrI DD, nl RV size/fxn, mild MR/AI.   Hyperlipidemia LDL goal <70    started on Repatha  09/26/2021, continue Crestor  40mg  daily   Hypertension    Multiple vessel coronary artery disease    Myocardial infarct (HCC)    x 3; PCI with single stent placement for each (in [place] )   NSTEMI (non-ST elevated myocardial infarction) (HCC) 06/2020   STEMI (ST elevation myocardial infarction) (HCC) 09/23/2021   LHC 09/23/2021 x 2   Tobacco use    2 cigarettes daily   Unstable angina (HCC)    LHC 09/23/2021 x 2; total 8 DES to LIMA     Past Surgical History: Past Surgical History:  Procedure Laterality Date   CORONARY ANGIOPLASTY WITH STENT PLACEMENT     in [place]  in 2018-19   CORONARY ARTERY BYPASS GRAFT N/A 07/09/2020   Procedure: CORONARY ARTERY BYPASS GRAFTING (CABG) x THREE , USING LEFT INTERNAL MAMMARY ARTERY, AND RIGHT LEG GREATER SAPHENOUS VEIN HARVESTED ENDOSCOPICALLY;  Surgeon: Army Dallas NOVAK, MD;  Location: [facility] OR;  Service: Open Heart Surgery;  Laterality: N/A;   CORONARY PRESSURE/FFR STUDY N/A 07/06/2020   Procedure: INTRAVASCULAR PRESSURE WIRE/FFR STUDY;  Surgeon: Darron Deatrice LABOR, MD;  Location: [facility] INVASIVE CV LAB;  Service: Cardiovascular;  Laterality: N/A;  LAD   CORONARY STENT INTERVENTION N/A 09/23/2021   Procedure: CORONARY STENT INTERVENTION;  Surgeon: Darron Deatrice LABOR, MD;  Location: [facility] INVASIVE  CV LAB;  Service: Cardiovascular;  Laterality: N/A;   CORONARY/GRAFT ACUTE MI REVASCULARIZATION N/A 09/23/2021   Procedure: Coronary/Graft Acute MI Revascularization;  Surgeon: Darron Deatrice LABOR, MD;  Location: [facility] INVASIVE CV LAB;  Service: Cardiovascular;  Laterality: N/A;   ERCP N/A 05/25/2024   Procedure: ERCP, WITH INTERVENTION IF INDICATED;  Surgeon: Jinny Carmine, MD;   Location: [facility] ENDOSCOPY;  Service: Endoscopy;  Laterality: N/A;   IR EXCHANGE BILIARY DRAIN  05/27/2024   IR INT EXT BILIARY DRAIN WITH CHOLANGIOGRAM  05/27/2024   IR PERC CHOLECYSTOSTOMY  04/29/2024   LEFT HEART CATH AND CORONARY ANGIOGRAPHY N/A 07/06/2020   Procedure: LEFT HEART CATH AND CORONARY ANGIOGRAPHY;  Surgeon: Darron Deatrice LABOR, MD;  Location: [facility] INVASIVE CV LAB;  Service: Cardiovascular;  Laterality: N/A;   LEFT HEART CATH AND CORONARY ANGIOGRAPHY N/A 09/23/2021   Procedure: LEFT HEART CATH AND CORONARY ANGIOGRAPHY;  Surgeon: Darron Deatrice LABOR, MD;  Location: [facility] INVASIVE CV LAB;  Service: Cardiovascular;  Laterality: N/A;   LEFT HEART CATH AND CORS/GRAFTS ANGIOGRAPHY N/A 09/23/2021   Procedure: LEFT HEART CATH AND CORS/GRAFTS ANGIOGRAPHY;  Surgeon: Darron Deatrice LABOR, MD;  Location: [facility] INVASIVE CV LAB;  Service: Cardiovascular;  Laterality: N/A;   TEE WITHOUT CARDIOVERSION N/A 07/09/2020   Procedure: TRANSESOPHAGEAL ECHOCARDIOGRAM (TEE);  Surgeon: Army Dallas NOVAK, MD;  Location: [facility] OR;  Service: Open Heart Surgery;  Laterality: N/A;   WRIST SURGERY Left    for cyst    Home Medications: Prior to Admission medications  Medication Sig Start Date End Date Taking? Authorizing Provider  albuterol  (VENTOLIN  HFA) 108 (90 Base) MCG/ACT inhaler Inhale 2 puffs into the lungs every 6 (six) hours as needed for wheezing or shortness of breath. 06/16/23  Yes Towana Small, FNP  aspirin  81 MG chewable tablet Chew 1 tablet (81 mg total) by mouth daily. 02/23/24  Yes Towana Small, FNP  clopidogrel  (PLAVIX ) 75 MG tablet Take 1 tablet (75 mg total) by mouth daily. 06/15/24  Yes Towana Small, FNP  isosorbide  mononitrate (IMDUR ) 30 MG 24 hr tablet Take 2 tablets (60 mg total) by mouth 2 (two) times daily. 02/23/24 06/10/26 Yes Towana Small, FNP  Meclizine  HCl 25 MG CHEW Chew 2 tablets (50 mg total) by mouth 2 (two) times daily as needed. 06/15/24  Yes Towana Small, FNP  metFORMIN   (GLUCOPHAGE -XR) 500 MG 24 hr tablet Take 2 tablets (1,000 mg total) by mouth 2 (two) times daily with a meal. 06/15/24  Yes Towana Small, FNP  metoprolol  tartrate (LOPRESSOR ) 50 MG tablet Take 1 tablet (50 mg total) by mouth 2 (two) times daily. 06/15/24  Yes Towana Small, FNP  nitroGLYCERIN  (NITROSTAT ) 0.4 MG SL tablet Place 1 tablet (0.4 mg total) under the tongue every 5 (five) minutes as needed for chest pain. 06/15/24  Yes Towana Small, FNP  pantoprazole  (PROTONIX ) 20 MG tablet Take 2 tablets (40 mg total) by mouth daily. 06/15/24  Yes Towana Small, FNP  rosuvastatin  (CRESTOR ) 40 MG tablet Take 1 tablet by mouth once daily 06/15/24  Yes Towana Small, FNP  sodium chloride  flush 0.9 % SOLN injection 5 mLs by Intracatheter route daily. 05/02/24 07/01/24 Yes Shishir Krantz, Aloysius, MD  sodium chloride  flush 0.9 % SOLN injection 5 mLs by Intracatheter route daily. Flush cholecystostomy tube daily with 5 ccs NS 05/02/24 07/01/24 Yes Schulz, Zachary R, PA-C  sodium chloride  flush 0.9 % SOLN injection 10 mLs by Intracatheter route daily. Flush EACH catheter with 5-23mL daily 06/03/24  Yes McInnis, Caitlyn M, PA-C  traZODone  (DESYREL )  50 MG tablet Take 1 tablet (50 mg total) by mouth at bedtime. 06/15/24  Yes Towana Small, FNP    Allergies: Allergies[1]  Review of Systems: Review of Systems  Constitutional:  Negative for chills and fever.  Respiratory:  Negative for shortness of breath.   Cardiovascular:  Negative for chest pain.  Gastrointestinal:  Positive for abdominal pain. Negative for constipation, nausea and vomiting.    Physical Exam BP 103/73   Pulse 96   Temp 97.8 F (36.6 C) (Oral)   Ht 5' 3 (1.6 m)   Wt 156 lb 6.4 oz (70.9 kg)   SpO2 96%   BMI 27.71 kg/m  CONSTITUTIONAL: No acute distress HEENT:  Normocephalic, atraumatic, extraocular motion intact. RESPIRATORY:  Normal respiratory effort without pathologic use of accessory muscles. CARDIOVASCULAR: Regular rhythm and  rate. GI: The abdomen is soft, nondistended, appropriate Swords to palpation in the right upper quadrant at the site of the drains.  Both drains in place with bilious fluid in the bag.  Dressings intact and clean without any evidence of surrounding infection.   NEUROLOGIC:  Motor and sensation is grossly normal.  Cranial nerves are grossly intact. PSYCH:  Alert and oriented to person, place and time. Affect is normal.  Labs/Imaging: CT abdomen/pelvis on 05/24/2024: IMPRESSION: 1. No CT evidence of acute pancreatitis. 2. Mild gallbladder wall thickening and edema with cholecystostomy tube in place, without radiopaque stones or bile duct dilatation. 3. 2 mm stone in the lower pole of the left kidney, without hydronephrosis or hydroureter. 4. Sigmoid diverticulosis without evidence of acute diverticulitis.  MRCP on 05/25/2024: IMPRESSION: 1. Minimal dilatation of the common bile duct (7 mm) with tiny signal abnormality in the distal common bile duct most consistent with a tiny distal CBC stone. 2. Cholecystostomy tube in place with tip within the lumen of the gallbladder, which is fluid filled but nondistended, measuring 2 cm in diameter. 3. No pancreatic ductal dilatation or inflammation. No peripancreatic fluid collections.  IR biliary drain exchange on 05/27/2024: IMPRESSION: 1. Unsuccessful attempted cholangiogram through existing percutaneous cholecystostomy tube due to extremely friable gallbladder. With minimal pressure, there was frank extravasation of contrast material from the gallbladder neck due to complete occlusion of the gallbladder neck/proximal cystic duct. 2. Successful exchange for a new 10 French percutaneous cholecystostomy tube. 3. Successful transhepatic percutaneous transhepatic cholangiogram with placement of an 8 French internal/external biliary drainage catheter.   Assessment and Plan: This is a 64 y.o. male with a history of acute cholecystitis and more recently  gallstone pancreatitis.  - Discussed with patient that given the gallbladder perforation at the neck during attempted cholangiogram through the drain, now the area around the cystic duct and gallbladder neck cannot be much more inflamed and scarred down.  Discussed with patient given these issues, she would be a more risk of potential bile duct injury while attempting dissection.  I think given this, it would be more appropriate to send him to hepatobiliary surgeon with more expertise.  He is in agreement with this. - For now, we will cancel his originally scheduled cholecystectomy on 06/28/2024.  He does not need to stop his Plavix  at this point but will need to stop his Plavix  for planned ERCP that is scheduled on 07/11/2024. - Will send referral to [facility] hepatobiliary surgery. - Follow-up as needed.  I spent 30 minutes dedicated to the care of this patient on the date of this encounter to include pre-visit review of records, face-to-face time with the patient discussing diagnosis  and management, and any post-visit coordination of care.   Aloysius Sheree Plant, MD [facility]         [1] No Known Allergies  "

## 2024-06-22 NOTE — Telephone Encounter (Signed)
 Faxed referral to Dr. Ozell Passey at [phone].

## 2024-06-23 ENCOUNTER — Telehealth: Payer: Self-pay | Admitting: *Deleted

## 2024-06-23 NOTE — Transitions of Care (Post Inpatient/ED Visit) (Signed)
 " Transition of Care week 3/ day # 17  Visit Note  06/23/2024  Name: Jeffery Sweeney MRN: [id number]          DOB: January 15, 1961  Situation: Patient enrolled in [facility] 30-day program. Visit completed with patient by telephone  HIPAA identifiers x 2 verified  Background:  Recent hospitalization December 10- 16, 2025 for (R) upper quadrant abdominal pain/ biliary colic with percutaneous drain placement Hospital re-admission January 6-16, 2026 for acute gallstone pancreatitis with ongoing percutaneous drain placements x 2 Independent; drives self; does not use assistive devices at baseline (2) unplanned hospital admission x last (6)/ (12) months  Initial Transition Care Management Follow-up Telephone Call Discharge Date and Diagnosis: 06/03/24, Acute gallstone pancreatitis   Past Medical History:  Diagnosis Date   CAD (coronary artery disease)    a. PCI x 3 - [place] ; b. 06/2020 NSTEMI-->CABG x 3 (LIMA->LAD, VG->RI, VG->RPDA); c. 09/2021 STEMI/PCI: LM nl, LAD 80p/m, D1 90, lat D1 90, D2 80, LCX 19m/d, OM2 80, RCA 98m, 30d, RPDA 90, VG->prox RCA->RPDA 30p, VG->D1 mild dzs, LIMA->LAD 80 (DES)-->complicated by acute closure-->DES x 7; d. 03/2022 MV: No isch/infarct, EF 54%.   CVA (cerebral vascular accident) (HCC) 06/2021   Small acute/subacute infarcts in the left occipital lobe and   Diastolic dysfunction    a. 10/2022 Echo: EF 55-60%, no rwma, GrI DD, nl RV size/fxn, mild MR/AI.   Hyperlipidemia LDL goal <70    started on Repatha  09/26/2021, continue Crestor  40mg  daily   Hypertension    Multiple vessel coronary artery disease    Myocardial infarct (HCC)    x 3; PCI with single stent placement for each (in [place] )   NSTEMI (non-ST elevated myocardial infarction) (HCC) 06/2020   STEMI (ST elevation myocardial infarction) (HCC) 09/23/2021   LHC 09/23/2021 x 2   Tobacco use    2 cigarettes daily   Unstable angina (HCC)    LHC 09/23/2021 x 2; total 8 DES to LIMA   Assessment:  I am  doing fine; have seen a lot of doctors lately; the surgeon here is sending me to a specialists surgeon at [facility] in [facility], said the specialist should do the surgery to remove the gallbladder; right now the drains are still in place, not much coming out of them and the surgeon yesterday said they look good and there is no problem with the drains.  I hope to get at least one of the drains removed when I go for the procedure on 07/11/24.  Driving myself and feeling pretty good, just ready for all of this to be over  Denies clinical concerns and sounds to be in no distress throughout [facility] 30-day program outreach call today  Patient Reported Symptoms: Cognitive Cognitive Status: Alert and oriented to person, place, and time, Normal speech and language skills, Insightful and able to interpret abstract concepts, Difficulties with attention and concentration Cognitive/Intellectual Conditions Management [RPT]: None reported or documented in medical history or problem list      Neurological Neurological Review of Symptoms: No symptoms reported    HEENT HEENT Symptoms Reported: No symptoms reported      Cardiovascular Cardiovascular Symptoms Reported: No symptoms reported Does patient have uncontrolled Hypertension?: No Cardiovascular Management Strategies: Routine screening, Medication therapy, Coping strategies, Adequate rest  Respiratory Other Respiratory Symptoms: Denies shortness of breath and sounds to be in no respiratory distress throughout [facility] call Respiratory Management Strategies: Adequate rest, Coping strategies  Endocrine Endocrine Symptoms Reported: No symptoms reported Is patient diabetic?:  Yes Is patient checking blood sugars at home?: Yes List most recent blood sugar readings, include date and time of day: I check the blood sugars at home about every other day and day before yesterday it was 101; when I saw my PCP this week, she said my blood sugars are looking great and not to change  anything at all    Gastrointestinal Gastrointestinal Symptoms Reported: No symptoms reported, Other Other Gastrointestinal Symptoms: Reports normal/ regular BM's with last BM reported 06/22/24:  confirms continues to independently self- manage abdominal percutaneous drains x 2: reports they only have a little bit of drainage now; reports surgeon told me yesterday everything looks great with the drains; reports to have one of them removed on 07/11/24 during the scheduled procedure; Confirms referral for surgical intervention at [facility] has been placed by Dr. Desiderio on 06/22/24 Gastrointestinal Management Strategies: Coping strategies, Adequate rest    Genitourinary Genitourinary Symptoms Reported: No symptoms reported    Integumentary Integumentary Symptoms Reported: Other Other Integumentary Symptoms: The surgeon yesterday said the drain sites look great and told me I was doing a real good job with the dressings that I am now applying by myself  Continues to deny redness, swelling, drainage at percutaneous drain sites Skin Management Strategies: Dressing changes, Routine screening, Coping strategies  Musculoskeletal Musculoskelatal Symptoms Reviewed: No symptoms reported Additional Musculoskeletal Details: confirmed not currently requiring/ using assistive devices for ambulation as per baseline; confirmed no new/ recent falls since last [facility] outreach last week Musculoskeletal Management Strategies: Adequate rest, Routine screening, Coping strategies   Fall risk Follow up: Falls prevention discussed  Psychosocial Psychosocial Symptoms Reported: No symptoms reported         There were no vitals filed for this visit.    Medications Reviewed Today     Reviewed by Nikeya Maxim M, RN (Registered Nurse) on 06/23/24 at 1117  Med List Status: <None>   Medication Order Taking? Sig Documenting Provider Last Dose Status Informant  albuterol  (VENTOLIN  HFA) 108 (90 Base) MCG/ACT inhaler [id number]   Inhale 2 puffs into the lungs every 6 (six) hours as needed for wheezing or shortness of breath. Towana Small, FNP  Active Self  aspirin  81 MG chewable tablet [id number]  Chew 1 tablet (81 mg total) by mouth daily. Towana Small, FNP  Active Self  clopidogrel  (PLAVIX ) 75 MG tablet [id number]  Take 1 tablet (75 mg total) by mouth daily. Towana Small, FNP  Active   isosorbide  mononitrate (IMDUR ) 30 MG 24 hr tablet [id number]  Take 2 tablets (60 mg total) by mouth 2 (two) times daily. Towana Small, FNP  Active Self  Meclizine  HCl 25 MG CHEW [id number]  Chew 2 tablets (50 mg total) by mouth 2 (two) times daily as needed. Towana Small, FNP  Active   metFORMIN  (GLUCOPHAGE -XR) 500 MG 24 hr tablet [id number]  Take 2 tablets (1,000 mg total) by mouth 2 (two) times daily with a meal. Towana Small, FNP  Active   metoprolol  tartrate (LOPRESSOR ) 50 MG tablet [id number]  Take 1 tablet (50 mg total) by mouth 2 (two) times daily. Towana Small, FNP  Active   nitroGLYCERIN  (NITROSTAT ) 0.4 MG SL tablet [id number]  Place 1 tablet (0.4 mg total) under the tongue every 5 (five) minutes as needed for chest pain. Towana Small, FNP  Active   pantoprazole  (PROTONIX ) 20 MG tablet [id number]  Take 2 tablets (40 mg total) by mouth daily. Towana Small, FNP  Active   rosuvastatin  (CRESTOR ) 40 MG tablet [id number]  Take  1 tablet by mouth once daily Towana Small, FNP  Active   sodium chloride  flush 0.9 % SOLN injection [id number]  5 mLs by Intracatheter route daily. Desiderio Schanz, MD  Active Self  sodium chloride  flush 0.9 % SOLN injection [id number]  5 mLs by Intracatheter route daily. Flush cholecystostomy tube daily with 5 ccs NS Schulz, Zachary R, PA-C  Active Self  sodium chloride  flush 0.9 % SOLN injection [id number]  10 mLs by Intracatheter route daily. Flush EACH catheter with 5-2mL daily McInnis, Caitlyn M, PA-C  Active Self  traZODone  (DESYREL ) 50 MG tablet [id number]  Take 1 tablet (50 mg total) by  mouth at bedtime. Towana Small, FNP  Active            Recommendation:   Specialty provider follow-up: planned- scheduled ERCP 07/11/24 Continue Current Plan of Care  Follow Up Plan:   Telephone follow-up in 1 week- as scheduled 06/30/24 per patient's stated preference  I appreciate the opportunity to participate in Dorrell's care,  Pls call/ message for questions,  Beatris Blinda Lawrence, RN, BSN, CCRN Alumnus RN Care Manager  Transitions of Care  [facility] - [facility] [phone]: direct office     "

## 2024-06-23 NOTE — Telephone Encounter (Signed)
 No VM set up.

## 2024-06-23 NOTE — Patient Instructions (Signed)
 Visit Information  Thank you for taking time to visit with me today. Please don't hesitate to contact me if I can be of assistance to you before our next scheduled telephone appointment.  Our next appointment is by telephone on Thursday June 30, 2024 at 11:30 am  Please call the care guide team at [phone] if you need to cancel or reschedule your appointment.   Following are the goals we discussed today:  Patient Self Care Activities:  Attend all scheduled provider appointments Call pharmacy for medication refills 3-7 days in advance of running out of medications Call provider office for new concerns or questions  Participate in Transition of Care Program/Attend TOC scheduled calls Take medications as prescribed   Empty biliary drain and measure as you have been, as instructed after your recent hospital visit Monitor pain and take pain medication as per ordered Monitor for any redness around tube site Continue monitoring and recording your blood sugars at home If you believe your condition is getting worse- contact your care providers (doctors) promptly- reaching out to your doctor early when you have concerns can prevent you from having to go to the hospital  If you are experiencing a Mental Health or Behavioral Health Crisis or need someone to talk to, please  call the Suicide and Crisis Lifeline: 988 call the [place]  National Suicide Prevention Lifeline: 908 492 2347 or TTY: 812-139-7865 TTY (531)597-7092) to talk to a trained counselor call 1-800-273-TALK (toll free, 24 hour hotline) go to [facility] [address] [place] [phone]) call the [facility] Crisis Line: [phone] call 911   Care plan and visit instructions communicated with the patient verbally today. The patient DECLINED to receive copy of care plan and patient instructions in any format.   Pls call/ message for questions,  Karlos Scadden Mckinney Krishav Mamone, RN, BSN,  CCRN Alumnus RN Care Manager  Transitions of Care  [facility] [phone]: direct office

## 2024-06-24 NOTE — Telephone Encounter (Signed)
 Jeffery Sweeney said they have not called him yet to schedule an appointment with [facility] [place]. Pt said the doctor the doctor who put second bag messed something back and this is why they could not fix it and needs to see [facility] [place]. Otherwise they could've done it.

## 2024-06-27 ENCOUNTER — Telehealth: Payer: Self-pay

## 2024-06-28 ENCOUNTER — Encounter: Payer: Self-pay | Admitting: Urgent Care

## 2024-06-28 ENCOUNTER — Ambulatory Visit: Admit: 2024-06-28 | Payer: Self-pay | Admitting: Surgery

## 2024-06-30 ENCOUNTER — Telehealth: Payer: Self-pay | Admitting: *Deleted

## 2024-07-11 ENCOUNTER — Ambulatory Visit: Admit: 2024-07-11 | Payer: MEDICAID | Admitting: Gastroenterology

## 2024-07-11 SURGERY — ERCP, WITH INTERVENTION IF INDICATED
Anesthesia: General

## 2024-08-10 ENCOUNTER — Ambulatory Visit: Payer: Self-pay | Admitting: Family Medicine
